# Patient Record
Sex: Female | Born: 1935 | Race: White | Hispanic: No | Smoking: Former smoker
Health system: Southern US, Community
[De-identification: ages and names within clinical notes are randomized; demographics above are authoritative.]

## PROBLEM LIST (undated history)

## (undated) DIAGNOSIS — K219 Gastro-esophageal reflux disease without esophagitis: Secondary | ICD-10-CM

## (undated) DIAGNOSIS — J189 Pneumonia, unspecified organism: Secondary | ICD-10-CM

## (undated) DIAGNOSIS — K5790 Diverticulosis of intestine, part unspecified, without perforation or abscess without bleeding: Secondary | ICD-10-CM

## (undated) DIAGNOSIS — M797 Fibromyalgia: Secondary | ICD-10-CM

## (undated) DIAGNOSIS — K5792 Diverticulitis of intestine, part unspecified, without perforation or abscess without bleeding: Secondary | ICD-10-CM

## (undated) DIAGNOSIS — R569 Unspecified convulsions: Secondary | ICD-10-CM

## (undated) DIAGNOSIS — F419 Anxiety disorder, unspecified: Secondary | ICD-10-CM

## (undated) DIAGNOSIS — S329XXA Fracture of unspecified parts of lumbosacral spine and pelvis, initial encounter for closed fracture: Secondary | ICD-10-CM

## (undated) DIAGNOSIS — K668 Other specified disorders of peritoneum: Secondary | ICD-10-CM

## (undated) DIAGNOSIS — I10 Essential (primary) hypertension: Secondary | ICD-10-CM

## (undated) DIAGNOSIS — E039 Hypothyroidism, unspecified: Secondary | ICD-10-CM

## (undated) DIAGNOSIS — M109 Gout, unspecified: Secondary | ICD-10-CM

## (undated) DIAGNOSIS — E079 Disorder of thyroid, unspecified: Secondary | ICD-10-CM

## (undated) DIAGNOSIS — M4322 Fusion of spine, cervical region: Secondary | ICD-10-CM

## (undated) DIAGNOSIS — R7303 Prediabetes: Secondary | ICD-10-CM

## (undated) DIAGNOSIS — G459 Transient cerebral ischemic attack, unspecified: Secondary | ICD-10-CM

## (undated) DIAGNOSIS — M1990 Unspecified osteoarthritis, unspecified site: Secondary | ICD-10-CM

## (undated) HISTORY — PX: ABDOMINAL HYSTERECTOMY: SHX81

## (undated) HISTORY — DX: Gout, unspecified: M10.9

## (undated) HISTORY — DX: Diverticulitis of intestine, part unspecified, without perforation or abscess without bleeding: K57.92

## (undated) HISTORY — PX: CERVICAL FUSION: SHX112

## (undated) HISTORY — PX: TOTAL VAGINAL HYSTERECTOMY: SHX2548

## (undated) HISTORY — DX: Fracture of unspecified parts of lumbosacral spine and pelvis, initial encounter for closed fracture: S32.9XXA

## (undated) HISTORY — PX: TONSILLECTOMY: SUR1361

## (undated) HISTORY — DX: Disorder of thyroid, unspecified: E07.9

## (undated) HISTORY — PX: SPINE SURGERY: SHX786

---
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
## 1998-07-25 ENCOUNTER — Ambulatory Visit (HOSPITAL_COMMUNITY): Admission: RE | Admit: 1998-07-25 | Discharge: 1998-07-25 | Payer: Self-pay | Admitting: Neurosurgery

## 1998-07-25 ENCOUNTER — Encounter: Payer: Self-pay | Admitting: Neurosurgery

## 1998-09-24 ENCOUNTER — Other Ambulatory Visit: Admission: RE | Admit: 1998-09-24 | Discharge: 1998-09-24 | Payer: Self-pay | Admitting: Obstetrics and Gynecology

## 1998-10-11 ENCOUNTER — Encounter: Payer: Self-pay | Admitting: Neurosurgery

## 1998-10-15 ENCOUNTER — Encounter: Payer: Self-pay | Admitting: Neurosurgery

## 1998-10-15 ENCOUNTER — Inpatient Hospital Stay (HOSPITAL_COMMUNITY): Admission: RE | Admit: 1998-10-15 | Discharge: 1998-10-17 | Payer: Self-pay | Admitting: Neurosurgery

## 1999-01-24 ENCOUNTER — Ambulatory Visit (HOSPITAL_COMMUNITY): Admission: RE | Admit: 1999-01-24 | Discharge: 1999-01-24 | Payer: Self-pay | Admitting: Neurosurgery

## 1999-01-24 ENCOUNTER — Encounter: Payer: Self-pay | Admitting: Neurosurgery

## 1999-02-14 ENCOUNTER — Inpatient Hospital Stay (HOSPITAL_COMMUNITY): Admission: RE | Admit: 1999-02-14 | Discharge: 1999-02-16 | Payer: Self-pay | Admitting: Neurosurgery

## 1999-02-14 ENCOUNTER — Encounter: Payer: Self-pay | Admitting: Neurosurgery

## 1999-04-30 ENCOUNTER — Ambulatory Visit (HOSPITAL_COMMUNITY): Admission: RE | Admit: 1999-04-30 | Discharge: 1999-04-30 | Payer: Self-pay | Admitting: Neurosurgery

## 1999-09-17 ENCOUNTER — Inpatient Hospital Stay (HOSPITAL_COMMUNITY): Admission: EM | Admit: 1999-09-17 | Discharge: 1999-09-17 | Payer: Self-pay | Admitting: Emergency Medicine

## 1999-09-17 ENCOUNTER — Encounter: Payer: Self-pay | Admitting: Gastroenterology

## 1999-09-30 ENCOUNTER — Ambulatory Visit (HOSPITAL_COMMUNITY): Admission: RE | Admit: 1999-09-30 | Discharge: 1999-09-30 | Payer: Self-pay | Admitting: Gastroenterology

## 1999-10-27 ENCOUNTER — Other Ambulatory Visit: Admission: RE | Admit: 1999-10-27 | Discharge: 1999-10-27 | Payer: Self-pay | Admitting: Internal Medicine

## 2000-05-24 ENCOUNTER — Encounter: Admission: RE | Admit: 2000-05-24 | Discharge: 2000-08-22 | Payer: Self-pay | Admitting: Internal Medicine

## 2005-03-02 ENCOUNTER — Ambulatory Visit (HOSPITAL_COMMUNITY): Admission: RE | Admit: 2005-03-02 | Discharge: 2005-03-02 | Payer: Self-pay | Admitting: Neurosurgery

## 2005-03-03 ENCOUNTER — Ambulatory Visit: Payer: Self-pay | Admitting: Oncology

## 2005-03-03 ENCOUNTER — Ambulatory Visit (HOSPITAL_COMMUNITY): Admission: RE | Admit: 2005-03-03 | Discharge: 2005-03-03 | Payer: Self-pay | Admitting: Neurosurgery

## 2005-03-05 ENCOUNTER — Ambulatory Visit (HOSPITAL_COMMUNITY): Admission: RE | Admit: 2005-03-05 | Discharge: 2005-03-05 | Payer: Self-pay | Admitting: Oncology

## 2005-05-26 ENCOUNTER — Inpatient Hospital Stay (HOSPITAL_COMMUNITY): Admission: RE | Admit: 2005-05-26 | Discharge: 2005-06-05 | Payer: Self-pay | Admitting: Neurosurgery

## 2010-12-20 ENCOUNTER — Encounter: Payer: Self-pay | Admitting: Neurosurgery

## 2012-03-14 ENCOUNTER — Other Ambulatory Visit: Payer: Self-pay | Admitting: Neurosurgery

## 2012-03-14 DIAGNOSIS — M47812 Spondylosis without myelopathy or radiculopathy, cervical region: Secondary | ICD-10-CM

## 2012-03-22 ENCOUNTER — Ambulatory Visit
Admission: RE | Admit: 2012-03-22 | Discharge: 2012-03-22 | Disposition: A | Discharge status: Home / Self Care | Payer: Medicare Other | Source: Ambulatory Visit | Attending: Neurosurgery | Admitting: Neurosurgery

## 2012-03-22 DIAGNOSIS — M47812 Spondylosis without myelopathy or radiculopathy, cervical region: Secondary | ICD-10-CM

## 2012-03-25 ENCOUNTER — Encounter (HOSPITAL_COMMUNITY): Payer: Self-pay | Admitting: Physical Medicine and Rehabilitation

## 2012-03-25 ENCOUNTER — Emergency Department (HOSPITAL_COMMUNITY): Payer: Medicare Other

## 2012-03-25 ENCOUNTER — Emergency Department (HOSPITAL_COMMUNITY)
Admission: EM | Admit: 2012-03-25 | Discharge: 2012-03-25 | Disposition: A | Discharge status: Home / Self Care | Payer: Medicare Other | Attending: Emergency Medicine | Admitting: Emergency Medicine

## 2012-03-25 DIAGNOSIS — Z79899 Other long term (current) drug therapy: Secondary | ICD-10-CM | POA: Insufficient documentation

## 2012-03-25 DIAGNOSIS — S0990XA Unspecified injury of head, initial encounter: Secondary | ICD-10-CM | POA: Insufficient documentation

## 2012-03-25 DIAGNOSIS — Y92009 Unspecified place in unspecified non-institutional (private) residence as the place of occurrence of the external cause: Secondary | ICD-10-CM | POA: Insufficient documentation

## 2012-03-25 DIAGNOSIS — M25559 Pain in unspecified hip: Secondary | ICD-10-CM | POA: Insufficient documentation

## 2012-03-25 DIAGNOSIS — W19XXXA Unspecified fall, initial encounter: Secondary | ICD-10-CM

## 2012-03-25 DIAGNOSIS — R51 Headache: Secondary | ICD-10-CM | POA: Insufficient documentation

## 2012-03-25 DIAGNOSIS — W1789XA Other fall from one level to another, initial encounter: Secondary | ICD-10-CM | POA: Insufficient documentation

## 2012-03-25 DIAGNOSIS — M542 Cervicalgia: Secondary | ICD-10-CM | POA: Insufficient documentation

## 2012-03-25 DIAGNOSIS — S7001XA Contusion of right hip, initial encounter: Secondary | ICD-10-CM

## 2012-03-25 DIAGNOSIS — S7000XA Contusion of unspecified hip, initial encounter: Secondary | ICD-10-CM | POA: Insufficient documentation

## 2012-03-25 HISTORY — DX: Fibromyalgia: M79.7

## 2012-03-25 HISTORY — DX: Fusion of spine, cervical region: M43.22

## 2012-03-25 HISTORY — DX: Diverticulosis of intestine, part unspecified, without perforation or abscess without bleeding: K57.90

## 2012-03-25 MED ORDER — OXYCODONE-ACETAMINOPHEN 5-325 MG PO TABS: 1.0000 | ORAL_TABLET | ORAL | Status: DC | PRN

## 2012-03-25 MED ORDER — IBUPROFEN 200 MG PO TABS
600.0000 mg | ORAL_TABLET | Freq: Once | ORAL | Status: AC
  Administered 2012-03-25: 600 mg via ORAL
  Filled 2012-03-25: qty 3

## 2012-03-25 MED ORDER — OXYCODONE-ACETAMINOPHEN 5-325 MG PO TABS
1.0000 | ORAL_TABLET | Freq: Once | ORAL | Status: AC
  Administered 2012-03-25: 1 via ORAL
  Filled 2012-03-25: qty 1

## 2012-03-25 MED ORDER — OXYCODONE-ACETAMINOPHEN 5-325 MG PO TABS
1.0000 | ORAL_TABLET | Freq: Once | ORAL | Status: DC
  Filled 2012-03-25: qty 1

## 2012-03-25 NOTE — ED Provider Notes (Addendum)
History     CSN: [id number]  Arrival date & time 03/25/12  1741   First MD Initiated Contact with Patient 03/25/12 1801      Chief Complaint  Patient presents with  . Fall   HPI The patient was standing on her front porch with her husband when she fell backwards and landed on her butt and her back on the ground.  This was approximately 2 feet off the ground.  As reported that she hit the back of her head and had been complaining of neck pain.  She's had a history of cervical fusion.  Her pain is moderate at this time.  She denies weakness of her upper lower extremities.  She has no lacerations.  She denies hip pain at this time.  She does report mild headache.  She has no nausea or vomiting.  She does not take anticoagulants.  Nothing worsens her symptoms.  Nothing improves her symptoms.  Her symptoms are constant.  Her pain is mild/moderate   Past Medical History  Diagnosis Date  . Diverticulosis   . Cervical vertebral fusion   . Fibromyalgia     No past surgical history on file.  Family history: Noncontributory  History  Substance Use Topics  . Smoking status: Never Smoker   . Smokeless tobacco: Not on file  . Alcohol Use: No    OB History    Grav Para Term Preterm Abortions TAB SAB Ect Mult Living                  Review of Systems  Allergies  Codeine  Home Medications   Current Outpatient Rx  Name Route Sig Dispense Refill  . VITAMIN D PO Oral Take 1 capsule by mouth daily.    Marland Kitchen ESTROGENS, CONJUGATED 0.625 MG/GM VA CREA Vaginal Place 0.5 g vaginally every morning.    Marland Kitchen FOLIC ACID 1 MG PO TABS Oral Take 1 mg by mouth daily.    Marland Kitchen GARLIC PO Oral Take 1 tablet by mouth daily.    Marland Kitchen LEVOTHYROXINE SODIUM 75 MCG PO TABS Oral Take 75 mcg by mouth every morning.    Marland Kitchen LORATADINE 10 MG PO TABS Oral Take 10 mg by mouth daily as needed. For allergies    . LORAZEPAM 0.5 MG PO TABS Oral Take 0.25 mg by mouth 2 (two) times daily as needed. For anxiety/insomnia    .  OMEPRAZOLE 20 MG PO CPDR Oral Take 20 mg by mouth 2 (two) times daily.    Marland Kitchen ZOLPIDEM TARTRATE 5 MG PO TABS Oral Take 2.5 mg by mouth at bedtime. Pt may take an additional 1/2 tab at night if needed for sleep    . OXYCODONE-ACETAMINOPHEN 5-325 MG PO TABS Oral Take 1 tablet by mouth every 4 (four) hours as needed for pain. 15 tablet 0    BP 113/67  Pulse 80  Temp(Src) 96.7 F (35.9 C) (Oral)  Resp 16  SpO2 96%  Physical Exam  Nursing note and vitals reviewed. Constitutional: She is oriented to person, place, and time. She appears well-developed and well-nourished. No distress.  HENT:  Head: Normocephalic and atraumatic.  Eyes: EOM are normal. Pupils are equal, round, and reactive to light.  Neck: Neck supple.       Immobilized in cervical collar  Cardiovascular: Normal rate, regular rhythm and normal heart sounds.   Pulmonary/Chest: Effort normal and breath sounds normal.  Abdominal: Soft. She exhibits no distension. There is no tenderness. There is no rebound and  no guarding.  Musculoskeletal: Normal range of motion.       Mild discomfort with range of motion of her bilateral hips.  No obvious deformity.  She has no thoracic or lumbar spinal tenderness or step-offs.  Neurological: She is alert and oriented to person, place, and time.  Skin: Skin is warm and dry.  Psychiatric: She has a normal mood and affect. Judgment normal.    ED Course  Procedures (including critical care time)  Labs Reviewed - No data to display Dg Chest 2 View  03/25/2012  *RADIOLOGY REPORT*  Clinical Data: Status post fall.  CHEST - 2 VIEW  Comparison: 03/21/2012  Findings: Heart size appears normal.  No pleural effusion or edema.  There are no airspace consolidation identified.  There is a calcified granuloma identified in the right upper lobe.  Calcified right hilar and right paratracheal lymph nodes are also noted.  IMPRESSION:  1.  No acute cardiopulmonary abnormalities.  Original Report Authenticated By:  Rosealee Albee, M.D.   Dg Pelvis 1-2 Views  03/25/2012  *RADIOLOGY REPORT*  Clinical Data: Fall   PELVIS - 1-2 VIEW  Comparison: 03/02/2005  Findings: Status post L2-S1 fusion with hardware and interbody fusion with cages at each disc space.  There is no evidence of fracture or dislocation.  There is no evidence of arthropathy or other focal bone abnormality.  Soft tissues are unremarkable.  IMPRESSION: No acute findings.  Original Report Authenticated By: Rosealee Albee, M.D.   Ct Head Wo Contrast  03/25/2012  *RADIOLOGY REPORT*  Clinical Data:  Fall  CT HEAD WITHOUT CONTRAST CT CERVICAL SPINE WITHOUT CONTRAST  Technique:  Multidetector CT imaging of the head and cervical spine was performed following the standard protocol without intravenous contrast.  Multiplanar CT image reconstructions of the cervical spine were also generated.  Comparison:  MR cervical spine 03/22/2012  CT HEAD  Findings: Global atrophy.  Chronic ischemic changes.  No mass effect, midline shift, or acute intracranial hemorrhage.  Mastoid air cells are clear.  Visualized paranasal sinuses are clear. Cranium intact.  IMPRESSION: No acute intracranial pathology.  CT CERVICAL SPINE  Findings: Anterior plate and screws and C4 and C5.  Solid interbody fusion from C4-C6.  Degenerative changes at the craniocervical junction are stable.  No breakage or loosening of the hardware.  No acute fracture.  Anterolisthesis C7 upon T1 is stable with facet arthropathy.  Degenerative disc disease throughout the cervical spine is stable.  IMPRESSION: No acute bony pathology.  Postoperative and degenerative changes are noted without significant change from the recent MRI.  Original Report Authenticated By: Donavan Burnet, M.D.   Ct Cervical Spine Wo Contrast  03/25/2012  *RADIOLOGY REPORT*  Clinical Data:  Fall  CT HEAD WITHOUT CONTRAST CT CERVICAL SPINE WITHOUT CONTRAST  Technique:  Multidetector CT imaging of the head and cervical spine was performed  following the standard protocol without intravenous contrast.  Multiplanar CT image reconstructions of the cervical spine were also generated.  Comparison:  MR cervical spine 03/22/2012  CT HEAD  Findings: Global atrophy.  Chronic ischemic changes.  No mass effect, midline shift, or acute intracranial hemorrhage.  Mastoid air cells are clear.  Visualized paranasal sinuses are clear. Cranium intact.  IMPRESSION: No acute intracranial pathology.  CT CERVICAL SPINE  Findings: Anterior plate and screws and C4 and C5.  Solid interbody fusion from C4-C6.  Degenerative changes at the craniocervical junction are stable.  No breakage or loosening of the hardware.  No acute  fracture.  Anterolisthesis C7 upon T1 is stable with facet arthropathy.  Degenerative disc disease throughout the cervical spine is stable.  IMPRESSION: No acute bony pathology.  Postoperative and degenerative changes are noted without significant change from the recent MRI.  Original Report Authenticated By: Donavan Burnet, M.D.   I personally reviewed the patient's images.  1. Fall   2. Minor head injury   3. Contusion of right hip       MDM  The patient fell off her front porch.  Her CT head and CT C-spine is normal.  Her chest and pelvis are normal.  This was a mechanical fall.  She's been able to angulate around emergent Hartmann without any difficulty.  Her pain is significantly improved after treatment in the ER.  She understands to return the emergency department for new worsening symptoms        Lyanne Co, MD 03/27/12 1610  Lyanne Co, MD 03/27/12 0020

## 2012-03-25 NOTE — ED Notes (Signed)
Per EMS: pt was on front porch, 2 ft off ground, dog jumped up and knocked pt over, hitting back of head; pt c/o neck pain with history of cervical fusion; PMS intact; vss, no deformity noted to back of head or neck; a&ox4; no LOC; HR 100 regular  BP 140/82 RR 20; allergic to codeine

## 2012-03-25 NOTE — ED Notes (Signed)
Pt presents to department for evaluation of fall. States she was standing on porch this evening when x2 large dogs jumped on her knocking her off porch approximately 2 feet. Pt states she landed on back and struck back of head. Denies LOC. Now states headache, coccyx and neck pain. LSB and c-collar per EMS. She is conscious alert and oriented x4 at the time. No obvious deformities noted. Able to move all extremities without difficulty. No lacerations/abrasions noted to scalp.

## 2012-03-25 NOTE — ED Notes (Signed)
Spoke with pharmacy and Dr. Patria Mane regarding percocet. Pt to be given PO and will continue to monitor closely.

## 2012-03-25 NOTE — ED Notes (Signed)
LSB removed by Dr. Patria Mane. C-collar in place. Pt resting quietly at the time. Family at bedside.

## 2012-03-28 ENCOUNTER — Encounter (HOSPITAL_COMMUNITY): Payer: Self-pay | Admitting: Emergency Medicine

## 2012-03-28 ENCOUNTER — Emergency Department (HOSPITAL_COMMUNITY): Payer: Medicare Other

## 2012-03-28 ENCOUNTER — Observation Stay (HOSPITAL_COMMUNITY)
Admission: EM | Admit: 2012-03-28 | Discharge: 2012-03-29 | Disposition: A | Discharge status: Home / Self Care | Payer: Medicare Other | Attending: General Surgery | Admitting: General Surgery

## 2012-03-28 ENCOUNTER — Inpatient Hospital Stay (HOSPITAL_COMMUNITY): Payer: Medicare Other

## 2012-03-28 DIAGNOSIS — S2239XA Fracture of one rib, unspecified side, initial encounter for closed fracture: Principal | ICD-10-CM | POA: Insufficient documentation

## 2012-03-28 DIAGNOSIS — M25511 Pain in right shoulder: Secondary | ICD-10-CM

## 2012-03-28 DIAGNOSIS — Y92009 Unspecified place in unspecified non-institutional (private) residence as the place of occurrence of the external cause: Secondary | ICD-10-CM | POA: Insufficient documentation

## 2012-03-28 DIAGNOSIS — S2249XA Multiple fractures of ribs, unspecified side, initial encounter for closed fracture: Secondary | ICD-10-CM

## 2012-03-28 DIAGNOSIS — K668 Other specified disorders of peritoneum: Secondary | ICD-10-CM | POA: Diagnosis present

## 2012-03-28 DIAGNOSIS — S2231XA Fracture of one rib, right side, initial encounter for closed fracture: Secondary | ICD-10-CM | POA: Diagnosis present

## 2012-03-28 DIAGNOSIS — M549 Dorsalgia, unspecified: Secondary | ICD-10-CM | POA: Insufficient documentation

## 2012-03-28 DIAGNOSIS — W1789XA Other fall from one level to another, initial encounter: Secondary | ICD-10-CM | POA: Insufficient documentation

## 2012-03-28 DIAGNOSIS — W19XXXA Unspecified fall, initial encounter: Secondary | ICD-10-CM | POA: Diagnosis present

## 2012-03-28 DIAGNOSIS — M25519 Pain in unspecified shoulder: Secondary | ICD-10-CM | POA: Insufficient documentation

## 2012-03-28 LAB — POCT I-STAT, CHEM 8
BUN: 13 mg/dL (ref 6–23)
Calcium, Ion: 1.19 mmol/L (ref 1.12–1.32)
Chloride: 99 mEq/L (ref 96–112)
Creatinine, Ser: 0.8 mg/dL (ref 0.50–1.10)
Glucose, Bld: 100 mg/dL — ABNORMAL HIGH (ref 70–99)
HCT: 38 % (ref 36.0–46.0)
Hemoglobin: 12.9 g/dL (ref 12.0–15.0)
Potassium: 4 mEq/L (ref 3.5–5.1)
Sodium: 137 mEq/L (ref 135–145)
TCO2: 28 mmol/L (ref 0–100)

## 2012-03-28 LAB — CBC
HCT: 38 % (ref 36.0–46.0)
Hemoglobin: 13.1 g/dL (ref 12.0–15.0)
MCH: 32 pg (ref 26.0–34.0)
MCHC: 34.5 g/dL (ref 30.0–36.0)
MCV: 92.7 fL (ref 78.0–100.0)
Platelets: 339 10*3/uL (ref 150–400)
RBC: 4.1 MIL/uL (ref 3.87–5.11)
RDW: 12.8 % (ref 11.5–15.5)
WBC: 7.8 10*3/uL (ref 4.0–10.5)

## 2012-03-28 MED ORDER — IOHEXOL 300 MG/ML  SOLN: 20.0000 mL | INTRAMUSCULAR | Status: AC

## 2012-03-28 MED ORDER — KETOROLAC TROMETHAMINE 30 MG/ML IJ SOLN
30.0000 mg | Freq: Once | INTRAMUSCULAR | Status: AC
  Administered 2012-03-28: 30 mg via INTRAVENOUS
  Filled 2012-03-28: qty 1

## 2012-03-28 MED ORDER — METRONIDAZOLE IN NACL 5-0.79 MG/ML-% IV SOLN
500.0000 mg | Freq: Once | INTRAVENOUS | Status: AC
  Administered 2012-03-28: 500 mg via INTRAVENOUS
  Filled 2012-03-28: qty 100

## 2012-03-28 MED ORDER — PIPERACILLIN-TAZOBACTAM 3.375 G IVPB
3.3750 g | Freq: Once | INTRAVENOUS | Status: AC
  Administered 2012-03-28: 3.375 g via INTRAVENOUS
  Filled 2012-03-28: qty 50

## 2012-03-28 NOTE — ED Notes (Signed)
Pt transported to xray 

## 2012-03-28 NOTE — H&P (Signed)
Sara Davila is an 78 y.o. female.   Chief Complaint: Free air  HPI:  Pt is a 78 year old female who presented to the ER with right shoulder pain.  She fell off a porch on Friday when she got pushed off by an enthusiastic yellow lab.  She struck her right side.  She was seen here in the ED, and was sent home without any fractures, and a script for oxycodone that she did not fill.  Today, her right shoulder progressively hurt more and more, and she was worried that it was broken.  She denies nausea, vomiting, fevers, chills, sweats, abdominal pain.  She does have some mild back pain, but this is not her primary complaint.  She has increased pain in the right shoulder when she moves her right arm.    Past Medical History  Diagnosis Date  . Diverticulosis   . Cervical vertebral fusion   . Fibromyalgia     History reviewed. No pertinent past surgical history.  No family history on file. Social History:  reports that she has never smoked. She does not have any smokeless tobacco history on file. She reports that she does not drink alcohol or use illicit drugs.  Allergies:  Allergies  Allergen Reactions  . Codeine Rash     (Not in a hospital admission)  Results for orders placed during the hospital encounter of 03/28/12 (from the past 48 hour(s))  CBC     Status: Normal   Collection Time   03/28/12  6:38 PM      Component Value Range Comment   WBC 7.8  4.0 - 10.5 (K/uL)    RBC 4.10  3.87 - 5.11 (MIL/uL)    Hemoglobin 13.1  12.0 - 15.0 (g/dL)    HCT 16.1  09.6 - 04.5 (%)    MCV 92.7  78.0 - 100.0 (fL)    MCH 32.0  26.0 - 34.0 (pg)    MCHC 34.5  30.0 - 36.0 (g/dL)    RDW 40.9  81.1 - 91.4 (%)    Platelets 339  150 - 400 (K/uL)   POCT I-STAT, CHEM 8     Status: Abnormal   Collection Time   03/28/12  6:59 PM      Component Value Range Comment   Sodium 137  135 - 145 (mEq/L)    Potassium 4.0  3.5 - 5.1 (mEq/L)    Chloride 99  96 - 112 (mEq/L)    BUN 13  6 - 23 (mg/dL)    Creatinine, Ser 7.82  0.50 - 1.10 (mg/dL)    Glucose, Bld 956 (*) 70 - 99 (mg/dL)    Calcium, Ion 2.13  1.12 - 1.32 (mmol/L)    TCO2 28  0 - 100 (mmol/L)    Hemoglobin 12.9  12.0 - 15.0 (g/dL)    HCT 08.6  57.8 - 46.9 (%)    Ct Abdomen Pelvis Wo Contrast  03/28/2012  *RADIOLOGY REPORT*  Clinical Data: Larey Seat off porch last week continued pain, question free air  CT ABDOMEN AND PELVIS WITHOUT CONTRAST  Technique:  Multidetector CT imaging of the abdomen and pelvis was performed following the standard protocol without intravenous contrast. Sagittal and coronal MPR images reconstructed from axial data set.  Comparison: Noncontrast CT abdomen and pelvis from PET CT of 03/05/2005  Findings: Emphysematous changes at lung bases with scarring at base of right middle lobe. Multiple low attenuation lesions within the liver, most representing cysts, largest 14 mm diameter. A  single low attenuation hepatic lesion measuring 9 x 8 mm lesion image 17 contains central high attenuation, nonspecific. Within limits of a nonenhanced exam, remainder of liver, spleen, pancreas, kidneys and adrenal glands unremarkable. Tiny foci of free intraperitoneal air are identified adjacent to liver and stomach. These are of uncertain etiology.  Low descent of the urinary bladder in the pelvis consistent with cystocele. Inferior bladder wall thickening is questioned. Uterus is surgically absent with nonvisualization of the ovaries and appendix. Scattered stool throughout colon. Air filled large and small bowel loops. Decompressed stomach. No additional mass, adenopathy or hernia. Prior lumbar fusion L2-S1. Scattered degenerative disc disease changes thoracolumbar spine.  IMPRESSION: Foci of free intraperitoneal air identified in upper abdomen compatible with perforated viscus though source is uncertain. Low descent of urinary bladder in the pelvis compatible with cystocele; unable to exclude bladder wall thickening/tumor at inferior bladder.  Gaseous distention of large and small bowel loops throughout abdomen. Prior multilevel lumbar fusion. Hepatic cysts with an additional nonspecific and 8 x 9 mm lesion containing central high attenuation at right lobe of liver.  Critical Value/emergent results were called by telephone at the time of interpretation on 03/28/2012  at 1819 hours  to  Dr. Ranae Palms, who verbally acknowledged these results.  Original Report Authenticated By: Lollie Marrow, M.D.   Dg Ribs Unilateral W/chest Right  03/28/2012  *RADIOLOGY REPORT*  Clinical Data: History of trauma from a fall complaining of right sided chest pain.  RIGHT RIBS AND CHEST - 3+ VIEW  Comparison: Chest x-ray 03/25/2012.  Findings: PA view of the chest and two views of the right ribs demonstrate a subtle minimally displaced fracture of the lateral aspect of the right tenth rib. Adjacent to this fracture projecting over the soft tissues of the right upper quadrant of the abdomen (i.e., over the lateral aspect of the liver) there are several serpiginous lucencies concerning for potential gas.  In addition, there appeared to be subtle slivers of gas beneath the right hemidiaphragm.  There is no consolidative airspace disease and no definite pleural effusions.  No definite pneumothorax.  Heart size is normal. Mediastinal contours are slightly distorted by patient rotation to the right, but generally unremarkable.  Multiple small calcified pulmonary nodules are noted, consistent with granulomas. Orthopedic fixation hardware is noted in the lumbar spine and cervical spine.  IMPRESSION: 1.  Subtle minimally displaced fracture of the lateral aspect of the right tenth rib.  While this is not associated with a pneumothorax, there is evidence to suggest gas within the soft tissues in the right upper quadrant of the abdomen, and potentially even a small volume of pneumoperitoneum beneath the right hemidiaphragm.  Further evaluation at this time with a left lateral decubitus  view of the abdomen is highly recommended. Alternatively, if there is strong clinical concern for intra abdominal injury, further evaluation with CT of the abdomen could be obtained.  These results were called by telephone on 03/28/2012  at  03:20 p.m. to  Ardath Sax, who verbally acknowledged these results.  Original Report Authenticated By: Florencia Reasons, M.D.   Dg Shoulder Right  03/28/2012  *RADIOLOGY REPORT*  Clinical Data: Fall  RIGHT SHOULDER - 2+ VIEW  Comparison: None.  Findings: No acute fracture and no dislocation.  IMPRESSION: No acute bony pathology.  Original Report Authenticated By: Donavan Burnet, M.D.   Dg Abd 2 Views  03/28/2012  **ADDENDUM** CREATED: 03/28/2012 16:21:28  On the decubitus view there is a small air lucency over  the liver. Small amount of free air cannot be excluded.  Further evaluation with CT scan of the abdomen is recommended.  I discussed the findings and recommendations with PA taking care of the patient in the emergency room.  **END ADDENDUM** SIGNED BY: Natasha Mead, M.D.    03/28/2012  *RADIOLOGY REPORT*  Clinical Data:  left side decubitus view to rule out free air  ABDOMEN - 2 VIEW  Comparison: 03/25/2012  Findings: Metallic fixation rods and screws within the lumbar spine are stable.  Mild distended small bowel loops mid abdomen with some air fluid levels suspicious for ileus or early bowel obstruction. No definite free abdominal air is noted in the decubitus view.  IMPRESSION:  Mild distended small bowel loops mid abdomen with some air fluid levels suspicious for ileus or early bowel obstruction.  No definite free abdominal air is noted in the decubitus view. Stable postsurgical changes lumbar spine. Original Report Authenticated By: Natasha Mead, M.D.    Review of Systems  Constitutional: Negative.   HENT: Negative.   Eyes: Negative.   Respiratory: Negative.   Cardiovascular: Negative.   Gastrointestinal: Negative.   Genitourinary: Negative.    Musculoskeletal: Positive for joint pain (right shoulder pain) and falls (fall friday onto right side.).  Skin: Negative.   Neurological: Negative.   Endo/Heme/Allergies: Negative.   Psychiatric/Behavioral: Negative.     Blood pressure 131/77, pulse 79, temperature 97.8 F (36.6 C), temperature source Oral, resp. rate 16, SpO2 96.00%. Physical Exam  Constitutional: She is oriented to person, place, and time. She appears well-developed and well-nourished. No distress.  HENT:  Head: Normocephalic and atraumatic.  Right Ear: External ear normal.  Left Ear: External ear normal.  Mouth/Throat: No oropharyngeal exudate.  Eyes: Conjunctivae are normal. Pupils are equal, round, and reactive to light. No scleral icterus.  Neck: Normal range of motion. No tracheal deviation present. No thyromegaly present.  Cardiovascular: Normal rate, regular rhythm, normal heart sounds and intact distal pulses.  Exam reveals no gallop and no friction rub.   No murmur heard. Respiratory: Effort normal and breath sounds normal. No respiratory distress. She has no wheezes. She has no rales. She exhibits no tenderness.  GI: Soft. Bowel sounds are normal. She exhibits no distension and no mass. There is no tenderness. There is no rebound and no guarding.  Musculoskeletal: She exhibits tenderness (tenderness of right shoulder in the bicipital groove anteriorly). She exhibits no edema.       Right shoulder decreased range of motion  Lymphadenopathy:    She has no cervical adenopathy.  Neurological: She is alert and oriented to person, place, and time. Coordination normal.  Skin: Skin is warm and dry. No rash noted. She is not diaphoretic. No erythema. No pallor.  Psychiatric: She has a normal mood and affect. Her behavior is normal. Judgment and thought content normal.     Assessment/Plan 1.  Small amount of intraperitoneal air.     Pt without abdominal symptoms.  Having point tenderness in the shoulder, so do  not suspect that shoulder pain is representative of diaphragmatic irritation.  Would repeat CT abdomen with oral and IV contrast to evaluate for visceral injury.  Would keep NPO at least until AM and would repeat abdominal xrays in AM.  Will place on IV fluids and antibiotics.    2.  R shoulder pain.  Plain films negative for fracture.  Certainly could have sprain.  Consider ortho consult or MR tomorrow if no improvement.  3.  Single R 10th rib fracture- suspect this is likely cause of free air given lack of symptoms.  Would treat with pain control and incentive spirometry.    Annalisse Minkoff 03/28/2012, 8:37 PM

## 2012-03-28 NOTE — ED Notes (Signed)
Pt states she needs an xray of her right shoulder. States she cannot move it without a lot of pain. Pt states that she doesn't like oxycodone because it constipates her and would rather have ultram as her friend told her it might not be as bad.

## 2012-03-28 NOTE — ED Provider Notes (Signed)
Report received and pt care assumed from PA Schinlever at change of shift. Pt with fall on Friday from 4 ft, landing on back, seen in ED for same with negative initial imaging studies. Increased right shoulder and right side pain since that time. Pt had x-rays of right shoulder and right ribs ordered initially. Questionable free air under the diaphragm on chest view- decub view ordered per radiologist request.    4:35 PM Spoke with radiologist, who advises that close inspection of decub view again demonstrates possible free air- CT abd/pelvis without contrast ordered per his recommendation.       6:25 PM CT abd/pelvis results were called to attending MD- evidence of free air. Basic labs are ordered in preparation for [facility] consultation and hospital admission. Discussed prolonged ED course with pt and spouse.    7:00 PM Labs resulted and reviewed. No anemia or leukocytosis. EDP to call [facility].  Shaaron Adler, [place] 03/28/12 2328

## 2012-03-28 NOTE — ED Notes (Signed)
Was here on 4/26 after falling off porch had xrays but now her  Rt shoulder hurts and her rib cage hurts and she wants ultram instead of oxycodone

## 2012-03-28 NOTE — ED Notes (Signed)
Pt returned from xray

## 2012-03-28 NOTE — ED Notes (Signed)
Patient transported to CT 

## 2012-03-28 NOTE — ED Provider Notes (Signed)
History     CSN: [id number]  Arrival date & time 03/28/12  1108   First MD Initiated Contact with Patient 03/28/12 1345      Chief Complaint  Patient presents with  . Shoulder Pain    (Consider location/radiation/quality/duration/timing/severity/associated sxs/prior treatment) HPI History provided by pt and prior chart.  Per prior chart, pt seen in ED on 03/25/12 for fall from portch onto buttocks, a drop of approx 36ft.  Had a CT head/cerivcal spine and xray chest/pelvis that were neg for acute pathology.  Pt d/c'd home w/ oxycodone for pain.  Pt returns to the ED because her neck/back pain have improved but she developed pain in her right shoulder and right lateral side the day after injury.  Pain in right shoulder severe and aggravated by ROM.  No associated extremity weakness/paresthesias.  Rib pain is aggravated by movement and deep inspiration.  Denies abd pain.  Did not fill her oxycodone because concerned that it will constipate her.  She would like a prescription for ultram instead.  Pt is not anti-coagulated.   Past Medical History  Diagnosis Date  . Diverticulosis   . Cervical vertebral fusion   . Fibromyalgia     History reviewed. No pertinent past surgical history.  No family history on file.  History  Substance Use Topics  . Smoking status: Never Smoker   . Smokeless tobacco: Not on file  . Alcohol Use: No    OB History    Grav Para Term Preterm Abortions TAB SAB Ect Mult Living                  Review of Systems  All other systems reviewed and are negative.    Allergies  Codeine  Home Medications   Current Outpatient Rx  Name Route Sig Dispense Refill  . VITAMIN D PO Oral Take 1 capsule by mouth daily.    Marland Kitchen ESTROGENS, CONJUGATED 0.625 MG/GM VA CREA Vaginal Place 0.5 g vaginally every morning.    Marland Kitchen FOLIC ACID 1 MG PO TABS Oral Take 1 mg by mouth daily.    Marland Kitchen GARLIC PO Oral Take 1 tablet by mouth daily.    Marland Kitchen LEVOTHYROXINE SODIUM 75 MCG PO TABS Oral  Take 75 mcg by mouth every morning.    Marland Kitchen LORATADINE 10 MG PO TABS Oral Take 10 mg by mouth daily as needed. For allergies    . LORAZEPAM 0.5 MG PO TABS Oral Take 0.5 mg by mouth 2 (two) times daily as needed. For anxiety/insomnia    . OMEPRAZOLE 20 MG PO CPDR Oral Take 20 mg by mouth 2 (two) times daily.    Marland Kitchen ZOLPIDEM TARTRATE 5 MG PO TABS Oral Take 2.5 mg by mouth at bedtime. for sleep      BP 99/65  Pulse 87  Temp 98.9 F (37.2 C)  Resp 16  SpO2 95%  Physical Exam  Nursing note and vitals reviewed. Constitutional: She is oriented to person, place, and time. She appears well-developed and well-nourished. No distress.  HENT:  Head: Normocephalic and atraumatic.  Eyes:       Normal appearance  Neck: Normal range of motion.  Cardiovascular: Normal rate and regular rhythm.   Pulmonary/Chest: Effort normal and breath sounds normal. No respiratory distress.       Tenderness right lower anterior and lateral rib cage.  No overlying laceration, abrasion or ecchymosis.   Abdominal: Soft. Bowel sounds are normal. She exhibits no distension. There is no tenderness.  Musculoskeletal:  Right shoulder w/out deformity.  Mild tenderness at [facility] joint and proximal scapula.  Pain w/ passive flexion/abduction greater than 45deg.  Nml elbow and wrist.  2+ radial pulse and distal sensation intact.  Nml ROM in rest of extremities.   Neurological: She is alert and oriented to person, place, and time.  Skin: Skin is warm and dry. No rash noted.  Psychiatric: She has a normal mood and affect. Her behavior is normal.    ED Course  Procedures (including critical care time)  Labs Reviewed - No data to display Dg Ribs Unilateral W/chest Right  03/28/2012  *RADIOLOGY REPORT*  Clinical Data: History of trauma from a fall complaining of right sided chest pain.  RIGHT RIBS AND CHEST - 3+ VIEW  Comparison: Chest x-ray 03/25/2012.  Findings: PA view of the chest and two views of the right ribs demonstrate a  subtle minimally displaced fracture of the lateral aspect of the right tenth rib. Adjacent to this fracture projecting over the soft tissues of the right upper quadrant of the abdomen (i.e., over the lateral aspect of the liver) there are several serpiginous lucencies concerning for potential gas.  In addition, there appeared to be subtle slivers of gas beneath the right hemidiaphragm.  There is no consolidative airspace disease and no definite pleural effusions.  No definite pneumothorax.  Heart size is normal. Mediastinal contours are slightly distorted by patient rotation to the right, but generally unremarkable.  Multiple small calcified pulmonary nodules are noted, consistent with granulomas. Orthopedic fixation hardware is noted in the lumbar spine and cervical spine.  IMPRESSION: 1.  Subtle minimally displaced fracture of the lateral aspect of the right tenth rib.  While this is not associated with a pneumothorax, there is evidence to suggest gas within the soft tissues in the right upper quadrant of the abdomen, and potentially even a small volume of pneumoperitoneum beneath the right hemidiaphragm.  Further evaluation at this time with a left lateral decubitus view of the abdomen is highly recommended. Alternatively, if there is strong clinical concern for intra abdominal injury, further evaluation with CT of the abdomen could be obtained.  These results were called by telephone on 03/28/2012  at  03:20 p.m. to  Ardath Sax, who verbally acknowledged these results.  Original Report Authenticated By: Florencia Reasons, M.D.     No diagnosis found.    MDM  78yo F had a fall 4 days ago and presents w/ new onset right shoulder and rib pain; onset one day after injury.  Right anterior and lateral lower rib cage ttp but no respiratory distress and breath sounds nml.  Rib series shows minimally displaced fx of lateral tenth rib + gas w/in soft tissues in RUQ of abd and possibly small  pneumoperitoneum beneath right hemidiaphragm.  Radiology recommended left lateral decubitus view of abd.  Pt may ultimately need a CT.  These results have been discussed w/ pt and her husband.  Pt denied having abd pain and abd benign/non-tender on exam.  She also c/o right shoulder pain.  Exam sig for tenderness proximal scapula/AC joint w/ painful ROM and no NS deficits.  Xray pending.  Artis Flock, PA-C to dispo.  4:05 PM         Otilio Miu, [place] 03/29/12 [phone]

## 2012-03-29 ENCOUNTER — Inpatient Hospital Stay (HOSPITAL_COMMUNITY): Payer: Medicare Other

## 2012-03-29 DIAGNOSIS — K668 Other specified disorders of peritoneum: Secondary | ICD-10-CM | POA: Diagnosis present

## 2012-03-29 DIAGNOSIS — W19XXXA Unspecified fall, initial encounter: Secondary | ICD-10-CM | POA: Diagnosis present

## 2012-03-29 DIAGNOSIS — S2231XA Fracture of one rib, right side, initial encounter for closed fracture: Secondary | ICD-10-CM | POA: Diagnosis present

## 2012-03-29 HISTORY — DX: Other specified disorders of peritoneum: K66.8

## 2012-03-29 LAB — CBC
HCT: 33.1 % — ABNORMAL LOW (ref 36.0–46.0)
Hemoglobin: 11.5 g/dL — ABNORMAL LOW (ref 12.0–15.0)
MCH: 31.6 pg (ref 26.0–34.0)
MCHC: 34.7 g/dL (ref 30.0–36.0)
MCV: 90.9 fL (ref 78.0–100.0)
Platelets: 317 10*3/uL (ref 150–400)
RBC: 3.64 MIL/uL — ABNORMAL LOW (ref 3.87–5.11)
RDW: 12.6 % (ref 11.5–15.5)
WBC: 5.5 10*3/uL (ref 4.0–10.5)

## 2012-03-29 LAB — COMPREHENSIVE METABOLIC PANEL
ALT: 17 U/L (ref 0–35)
AST: 23 U/L (ref 0–37)
Albumin: 2.7 g/dL — ABNORMAL LOW (ref 3.5–5.2)
Alkaline Phosphatase: 48 U/L (ref 39–117)
BUN: 12 mg/dL (ref 6–23)
CO2: 25 mEq/L (ref 19–32)
Calcium: 8 mg/dL — ABNORMAL LOW (ref 8.4–10.5)
Chloride: 98 mEq/L (ref 96–112)
Creatinine, Ser: 0.71 mg/dL (ref 0.50–1.10)
GFR calc Af Amer: >90 mL/min (ref >90)
GFR calc non Af Amer: 80 mL/min — ABNORMAL LOW (ref >90)
Glucose, Bld: 101 mg/dL — ABNORMAL HIGH (ref 70–99)
Potassium: 4.1 mEq/L (ref 3.5–5.1)
Sodium: 131 mEq/L — ABNORMAL LOW (ref 135–145)
Total Bilirubin: 0.6 mg/dL (ref 0.3–1.2)
Total Protein: 5.2 g/dL — ABNORMAL LOW (ref 6.0–8.3)

## 2012-03-29 MED ORDER — DIPHENHYDRAMINE HCL 50 MG/ML IJ SOLN: 12.5000 mg | Freq: Four times a day (QID) | INTRAMUSCULAR | Status: DC | PRN

## 2012-03-29 MED ORDER — IOHEXOL 300 MG/ML  SOLN
80.0000 mL | Freq: Once | INTRAMUSCULAR | Status: AC | PRN
  Administered 2012-03-29: 80 mL via INTRAVENOUS

## 2012-03-29 MED ORDER — LORATADINE 10 MG PO TABS
10.0000 mg | ORAL_TABLET | Freq: Every day | ORAL | Status: DC | PRN
  Filled 2012-03-29: qty 1

## 2012-03-29 MED ORDER — ACETAMINOPHEN 325 MG PO TABS
650.0000 mg | ORAL_TABLET | Freq: Four times a day (QID) | ORAL | Status: DC | PRN
  Administered 2012-03-29: 650 mg via ORAL
  Filled 2012-03-29: qty 2

## 2012-03-29 MED ORDER — ZOLPIDEM TARTRATE 5 MG PO TABS
2.5000 mg | ORAL_TABLET | Freq: Every day | ORAL | Status: DC
  Administered 2012-03-29: 2.5 mg via ORAL
  Filled 2012-03-29: qty 1

## 2012-03-29 MED ORDER — METRONIDAZOLE IN NACL 5-0.79 MG/ML-% IV SOLN
500.0000 mg | Freq: Three times a day (TID) | INTRAVENOUS | Status: DC
  Administered 2012-03-29: 500 mg via INTRAVENOUS
  Filled 2012-03-29 (×3): qty 100

## 2012-03-29 MED ORDER — DIPHENHYDRAMINE HCL 12.5 MG/5ML PO ELIX
12.5000 mg | ORAL_SOLUTION | Freq: Four times a day (QID) | ORAL | Status: DC | PRN
  Filled 2012-03-29: qty 5

## 2012-03-29 MED ORDER — TRAMADOL HCL 50 MG PO TABS: 50.0000 mg | ORAL_TABLET | Freq: Four times a day (QID) | ORAL | Status: AC | PRN

## 2012-03-29 MED ORDER — LORAZEPAM 0.5 MG PO TABS: 0.5000 mg | ORAL_TABLET | Freq: Two times a day (BID) | ORAL | Status: DC | PRN

## 2012-03-29 MED ORDER — ACETAMINOPHEN 650 MG RE SUPP: 650.0000 mg | Freq: Four times a day (QID) | RECTAL | Status: DC | PRN

## 2012-03-29 MED ORDER — PANTOPRAZOLE SODIUM 40 MG PO TBEC
40.0000 mg | DELAYED_RELEASE_TABLET | Freq: Every day | ORAL | Status: DC
  Administered 2012-03-29: 40 mg via ORAL
  Filled 2012-03-29: qty 1

## 2012-03-29 MED ORDER — LEVOTHYROXINE SODIUM 75 MCG PO TABS
75.0000 ug | ORAL_TABLET | Freq: Every day | ORAL | Status: DC
  Administered 2012-03-29: 75 ug via ORAL
  Filled 2012-03-29 (×2): qty 1

## 2012-03-29 MED ORDER — CIPROFLOXACIN IN D5W 400 MG/200ML IV SOLN
400.0000 mg | Freq: Two times a day (BID) | INTRAVENOUS | Status: DC
  Administered 2012-03-29: 400 mg via INTRAVENOUS
  Filled 2012-03-29 (×3): qty 200

## 2012-03-29 MED ORDER — TRAMADOL HCL 50 MG PO TABS
50.0000 mg | ORAL_TABLET | Freq: Four times a day (QID) | ORAL | Status: DC | PRN
  Administered 2012-03-29: 100 mg via ORAL
  Filled 2012-03-29: qty 2

## 2012-03-29 MED ORDER — KCL IN DEXTROSE-NACL 10-5-0.45 MEQ/L-%-% IV SOLN
INTRAVENOUS | Status: DC
  Administered 2012-03-29: 02:00:00 via INTRAVENOUS
  Filled 2012-03-29 (×3): qty 1000

## 2012-03-29 NOTE — Progress Notes (Signed)
Abdominal exam completely benign. Tiny amount of free air most likely due to microperforation of one of the patient's many colonic or small bowel diverticuli.  Plan likely D/C to [facility] later this PM. I spoke at length with the patient and her husband. Patient examined and I agree with the assessment and plan  Violeta Gelinas, MD, MPH, FACS Pager: [phone]  03/29/2012 12:00 PM

## 2012-03-29 NOTE — Evaluation (Signed)
Occupational Therapy Evaluation Patient Details Name: Sara Davila MRN: [id number] DOB: 08-05-1930 Today's Date: 03/29/2012 Time: 4098-1191 OT Time Calculation (min): 32 min  OT Assessment / Plan / Recommendation Clinical Impression  This 78 yo female s/p fall onto her right shoulder presents to acute OT with RUE shoulder pain. Written instructions/exercises given with recommend follow up OPPT for continuation of work on RUE. No further acute OT needs, will sign off.    OT Assessment  Patient does not need any further OT services    Follow Up Recommendations   (Outpatient PT)    Equipment Recommendations  None recommended by OT    Frequency      Precautions / Restrictions Precautions Precautions: Fall Restrictions Weight Bearing Restrictions: No (to tolerance on LUE)       ADL  Eating/Feeding: Simulated;Independent Where Assessed - Eating/Feeding: Edge of bed Grooming: Simulated;Supervision/safety Where Assessed - Grooming: Standing at sink Upper Body Bathing: Simulated;Supervision/safety Where Assessed - Upper Body Bathing: Unsupported;Sit to stand from chair;Sit to stand from bed Lower Body Bathing: Simulated;Supervision/safety Where Assessed - Lower Body Bathing: Unsupported;Sit to stand from chair;Sit to stand from bed Upper Body Dressing: Simulated;Supervision/safety Where Assessed - Upper Body Dressing: Unsupported;Sit to stand from chair;Sit to stand from bed Lower Body Dressing: Simulated Where Assessed - Lower Body Dressing: Unsupported;Sit to stand from chair;Sit to stand from bed Toilet Transfer: Simulated Toilet Transfer Method: Stand pivot Toilet Transfer Equipment: Bedside commode Toileting - Clothing Manipulation: Simulated;Independent Where Assessed - Toileting Clothing Manipulation: Standing Toileting - Hygiene: Simulated;Independent Where Assessed - Toileting Hygiene: Sit on 3-in-1 or toilet Tub/Shower Transfer: Not assessed Tub/Shower Transfer  Method: Not assessed Equipment Used:  (None) Ambulation Related to ADLs: Per PT pt moving well ADL Comments: Encouraged pt to use RUE as much as she can tolerate; it can move her RUE in all directions some with more pain than others, but can move it. Cannot accept any resistance for shoulder movements without pain    OT Goals    Visit Information  Last OT Received On: 03/29/12 Assistance Needed: +1    Subjective Data  Subjective: The heat feels really good (to her shoulder) Patient Stated Goal: Get her right shoulder to be pain free   Prior Functioning  Home Living Lives With: Spouse Available Help at Discharge: Family;Available 24 hours/day Type of Home: Apartment Home Access: Level entry;Elevator Home Layout: One level Bathroom Shower/Tub: Tub/shower unit;C[employer] Firefighter: Handicapped height Bathroom Accessibility: Yes How Accessible: Accessible via walker Home Adaptive Equipment: Straight cane Prior Function Level of Independence: Independent Able to Take Stairs?: Reciprically Driving: Yes Vocation: Retired Musician: No difficulties Dominant Hand: Right (but writes left handed)    Cognition  Overall Cognitive Status: Appears within functional limits for tasks assessed/performed Arousal/Alertness: Awake/alert Orientation Level: Appears intact for tasks assessed Behavior During Session: [name] for tasks performed    Extremity/Trunk Assessment Right Upper Extremity Assessment RUE ROM/Strength/Tone: Deficits RUE ROM/Strength/Tone Deficits: Pain in sitting when gets to about 90 degrees shoulder flexion and abduction; as well as full external rotation. Left Upper Extremity Assessment LUE ROM/Strength/Tone: Within functional levels   Mobility Bed Mobility Bed Mobility: Supine to Sit;Sitting - Scoot to Edge of Bed Supine to Sit: 7: Independent Sitting - Scoot to Edge of Bed: 7: Independent Transfers Transfers: Sit to Stand;Stand to Sit Sit to  Stand: 7: Independent;With upper extremity assist;From bed Stand to Sit: 7: Independent;With upper extremity assist;To bed   Exercise    Balance    End of Session  OT - End of Session Equipment Utilized During Treatment:  (None) Activity Tolerance: Patient tolerated treatment well (mild limitations due to pain) Patient left: in bed;with call bell/phone within reach;with family/visitor present (husband)   Evette Georges [phone] 03/29/2012, 11:52 AM
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
## 2012-03-29 NOTE — ED Provider Notes (Signed)
Medical screening examination/treatment/procedure(s) were performed by non-physician practitioner and as supervising physician I was immediately available for consultation/collaboration.   Ouida Abeyta, MD 03/29/12 2338 

## 2012-03-29 NOTE — Progress Notes (Signed)
UR complete 

## 2012-03-29 NOTE — Discharge Instructions (Signed)
You have no restrictions. You may do anything your pain allows.

## 2012-03-29 NOTE — Progress Notes (Signed)
Patient ID: Sara Davila, female   DOB: 10-Nov-1930, 78 y.o.   MRN: [id number]   LOS: 1 day   Subjective: Denies abd pain, N/V. Would like ultram instead of oxycodone.  Objective: Vital signs in last 24 hours: Temp:  [97.1 F (36.2 C)-98.9 F (37.2 C)] 97.1 F (36.2 C) (04/30 0658) Pulse Rate:  [71-87] 74  (04/30 0658) Resp:  [16-18] 18  (04/30 0658) BP: (99-141)/(63-78) 132/72 mmHg (04/30 0658) SpO2:  [95 %-97 %] 96 % (04/30 0658) Weight:  [43.092 kg (95 lb)] 43.092 kg (95 lb) (04/29 2300) Last BM Date: 03/28/12  Lab Results:  CBC  Basename 03/29/12 0640 03/28/12 1859 03/28/12 1838  WBC 5.5 -- 7.8  HGB 11.5* 12.9 --  HCT 33.1* 38.0 --  PLT 317 -- 339   BMET  Basename 03/29/12 0640 03/28/12 1859  NA 131* 137  K 4.1 4.0  CL 98 99  CO2 25 --  GLUCOSE 101* 100*  BUN 12 13  CREATININE 0.71 0.80  CALCIUM 8.0* --    General appearance: alert and no distress Resp: clear to auscultation bilaterally Cardio: regular rate and rhythm GI: normal findings: bowel sounds normal and soft, non-tender  Assessment/Plan: Fall Right rib fx -- Pain control and pulmonary toilet Free air -- Not worrisome for acute abdomen. Will go ahead and feed, d/c antibiotics. Multiple medical problems FEN -- PT/OT consult VTE -- ambulate Dispo -- Likely home this afternoon if pain meds effective and PT/OT clear. Lives with husband at [facility].   Freeman Caldron, PA-C Pager: [phone] General Trauma PA Pager: [phone]   03/29/2012

## 2012-03-29 NOTE — ED Provider Notes (Signed)
Medical screening examination/treatment/procedure(s) were performed by non-physician practitioner and as supervising physician I was immediately available for consultation/collaboration.   Jatorian Renault, MD 03/29/12 2335 
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
## 2012-03-29 NOTE — Progress Notes (Signed)
DC home with husband. Verbally understood DC instructions.

## 2012-03-29 NOTE — Discharge Summary (Signed)
Clotilda Hafer, MD, MPH, FACS Pager: [phone]  

## 2012-06-01 ENCOUNTER — Other Ambulatory Visit (HOSPITAL_COMMUNITY): Payer: Self-pay | Admitting: Gastroenterology

## 2012-06-01 DIAGNOSIS — K5790 Diverticulosis of intestine, part unspecified, without perforation or abscess without bleeding: Secondary | ICD-10-CM

## 2012-06-01 DIAGNOSIS — R140 Abdominal distension (gaseous): Secondary | ICD-10-CM

## 2012-06-14 ENCOUNTER — Encounter (HOSPITAL_COMMUNITY)
Admission: RE | Admit: 2012-06-14 | Discharge: 2012-06-14 | Disposition: A | Discharge status: Home / Self Care | Payer: Medicare Other | Source: Ambulatory Visit | Attending: Gastroenterology | Admitting: Gastroenterology

## 2012-06-14 DIAGNOSIS — R1013 Epigastric pain: Secondary | ICD-10-CM | POA: Insufficient documentation

## 2012-06-14 DIAGNOSIS — R142 Eructation: Secondary | ICD-10-CM | POA: Insufficient documentation

## 2012-06-14 DIAGNOSIS — R141 Gas pain: Secondary | ICD-10-CM | POA: Insufficient documentation

## 2012-06-14 DIAGNOSIS — K5790 Diverticulosis of intestine, part unspecified, without perforation or abscess without bleeding: Secondary | ICD-10-CM

## 2012-06-14 DIAGNOSIS — K3189 Other diseases of stomach and duodenum: Secondary | ICD-10-CM | POA: Insufficient documentation

## 2012-06-14 DIAGNOSIS — R140 Abdominal distension (gaseous): Secondary | ICD-10-CM

## 2012-06-14 DIAGNOSIS — K5730 Diverticulosis of large intestine without perforation or abscess without bleeding: Secondary | ICD-10-CM | POA: Insufficient documentation

## 2012-06-14 DIAGNOSIS — R143 Flatulence: Secondary | ICD-10-CM | POA: Insufficient documentation

## 2012-06-14 MED ORDER — TECHNETIUM TC 99M SULFUR COLLOID
2.2000 | Freq: Once | INTRAVENOUS | Status: AC | PRN
  Administered 2012-06-14: 2.2 via INTRAVENOUS

## 2014-07-23 ENCOUNTER — Other Ambulatory Visit: Payer: Self-pay | Admitting: Gastroenterology

## 2014-07-23 DIAGNOSIS — R140 Abdominal distension (gaseous): Secondary | ICD-10-CM

## 2014-07-23 DIAGNOSIS — K6389 Other specified diseases of intestine: Secondary | ICD-10-CM

## 2014-07-26 ENCOUNTER — Ambulatory Visit
Admission: RE | Admit: 2014-07-26 | Discharge: 2014-07-26 | Disposition: A | Discharge status: Home / Self Care | Payer: Medicare Other | Source: Ambulatory Visit | Attending: Gastroenterology | Admitting: Gastroenterology

## 2014-07-26 DIAGNOSIS — K6389 Other specified diseases of intestine: Secondary | ICD-10-CM

## 2014-07-26 DIAGNOSIS — R140 Abdominal distension (gaseous): Secondary | ICD-10-CM

## 2015-06-14 ENCOUNTER — Other Ambulatory Visit: Payer: Self-pay | Admitting: Family Medicine

## 2015-06-14 DIAGNOSIS — R1310 Dysphagia, unspecified: Secondary | ICD-10-CM

## 2015-06-21 ENCOUNTER — Ambulatory Visit
Admission: RE | Admit: 2015-06-21 | Discharge: 2015-06-21 | Disposition: A | Discharge status: Home / Self Care | Payer: Commercial Managed Care - HMO | Source: Ambulatory Visit | Attending: Family Medicine | Admitting: Family Medicine

## 2015-06-21 DIAGNOSIS — R1310 Dysphagia, unspecified: Secondary | ICD-10-CM

## 2015-07-12 ENCOUNTER — Ambulatory Visit: Payer: Commercial Managed Care - HMO

## 2015-07-16 ENCOUNTER — Other Ambulatory Visit (HOSPITAL_COMMUNITY): Payer: Self-pay | Admitting: Family Medicine

## 2015-07-16 DIAGNOSIS — R1314 Dysphagia, pharyngoesophageal phase: Secondary | ICD-10-CM

## 2015-07-30 ENCOUNTER — Ambulatory Visit (HOSPITAL_COMMUNITY)
Admission: RE | Admit: 2015-07-30 | Discharge: 2015-07-30 | Disposition: A | Discharge status: Home / Self Care | Payer: Medicare Other | Source: Ambulatory Visit | Attending: Family Medicine | Admitting: Family Medicine

## 2015-07-30 DIAGNOSIS — R1314 Dysphagia, pharyngoesophageal phase: Secondary | ICD-10-CM | POA: Diagnosis not present

## 2015-07-30 NOTE — Procedures (Signed)
Objective Swallowing Evaluation: Other (Comment)  Patient Details  Name: STEPHANE JUNKINS MRN: [id number] Date of Birth: 1929-12-02  Today's Date: 07/30/2015 Time: SLP Start Time (ACUTE ONLY): 1310-SLP Stop Time (ACUTE ONLY): 1335 SLP Time Calculation (min) (ACUTE ONLY): 25 min  Past Medical History:  Past Medical History  Diagnosis Date  . Diverticulosis   . Cervical vertebral fusion   . Fibromyalgia    Past Surgical History: No past surgical history on file. HPI:  Other Pertinent Information: 81 yo female referred for MBS due to pt report of dysphagia.  Pt underwent recent barium swallow with findings of esophageal dysmotility, small hiatal hernia with GER, and concern for possible aspiration - radiologist recommended follow up SLP evaluation due to concern for aspiration on esophagram 06/21/2015.  Pt PMH + for anxiety, remote hx of TB, ? vasculitis- episode of confusion/seizure in FLA, fibromyalgia, OEA, GERD, cervical spine issues s/p ACDF in 96 and 2000 per pt.  Pt reports primary difficulty being sensation of "dust" hitting larynx and causing her to cough excessively - requiring water to clear.  She states she is apprehensive to drive due to this issue.  Pt denies unintentional weight loss due to dysphagia nor recurrent pneumonias.  She does report occasional issues with sensing food  *chicken* lodging in throat requiring liquids to clear.  Recent laryngitis reported by pt - x2 months ago with continued vocal hoarseness. Pt admits to using her PPI on and off - she stats she has been taking it for 2 weeks and had been off of it for two weeks prior to that time.  No Data Recorded  Assessment / Plan / Recommendation CHL IP CLINICAL IMPRESSIONS 07/30/2015  Therapy Diagnosis Mild pharyngeal phase dysphagia  Clinical Impression  Pt presents with minimal pharyngeal dysphagia with sensorimotor deficits resulting in decreased epiglottic deflection/tongue base retraction allowing minmal amount of  pharyngeal/vallecular residuals with solids/puree.  Consumption of liquids faciliates clearance.  Pt with trace laryngeal penetration of thin that cleared effectively with further swallows - given pt's age, this is [facility].  Barium tablet given with thin cleared through pharynx in timely fashion.  Pt may benefit from short-term SLP follow up to help mitigate her dysphagia by strengthening tongue base retraction.   Using live video, educated pt to findings and compensation strategies. Pt did not cough or report sensation of food lodging in throat during her evaluation.  Pt's major complaint of "choking" "coughing" - sensing dust in her throat was not reproduced during this evaluation. ? If pt has neural overstimulation due to recent laryngitis.  Xerostomia compensations provided as well in writing and verbally.          CHL IP TREATMENT RECOMMENDATION 07/30/2015  Treatment Recommendations F/u OP SLP     CHL IP DIET RECOMMENDATION 07/30/2015  SLP Diet Recommendations Age appropriate regular solids;Thin  Liquid Administration via Cup, straw  Medication Administration Whole meds with liquid- follow with banana or peanut butter to help clear per pt  Compensations Follow solids with liquid  Postural Changes and/or Swallow Maneuvers Reflux precautions     CHL IP OTHER RECOMMENDATIONS 07/30/2015  Recommended Consults Consider ENT evaluation  Oral Care Recommendations Oral care BID  Other Recommendations Clarify dietary restrictions         CHL IP REASON FOR REFERRAL 07/30/2015  Reason for Referral Objectively evaluate swallowing function     CHL IP ORAL PHASE 07/30/2015  Oral Phase WFL      CHL IP PHARYNGEAL PHASE 07/30/2015  Pharyngeal Phase  Impaired      CHL IP CERVICAL ESOPHAGEAL PHASE 07/30/2015  Cervical Esophageal Phase WFL  Pudding Teaspoon (None)  Pudding Cup (None)  Honey Teaspoon (None)  Honey Cup (None)  Honey Straw (None)  Nectar Teaspoon (None)  Nectar Cup (None)  Nectar Straw  (None)  Nectar Sippy Cup (None)  Thin Teaspoon (None)  Thin Cup (None)  Thin Straw (None)  Thin Sippy Cup (None)  Cervical Esophageal Comment (None)    CHL IP GO 07/30/2015  Functional Assessment Tool Used clinical judgement, MBS  Functional Limitations Swallowing  Swallow Current Status (D2202) CI  Swallow Goal Status (R4270) CI  Swallow Discharge Status [phone]) CI          Luanna Salk, [facility] SLP [phone]   07/30/2015, 2:39 PM

## 2015-08-07 ENCOUNTER — Ambulatory Visit: Payer: Medicare Other | Attending: Family Medicine

## 2015-08-07 DIAGNOSIS — R1313 Dysphagia, pharyngeal phase: Secondary | ICD-10-CM | POA: Insufficient documentation

## 2015-08-07 NOTE — Therapy (Signed)
[facility] [address] [place], [place], [address] Phone: [phone]   Fax:  [phone]  Patient Details  Name: Sara Davila MRN: [id number] Date of Birth: 1930/02/03 Referring Provider:  Aretta Nip, MD  Encounter Date: 08/07/2015  Met with pt briefly and her major concern is her voice, not necessarily swallowing, as that has improved much since she has become more careful with POs.   She mentioned referral to ENT and SLP concurred that may be an appropriate course of action at this time.  Pt to speak with her PCP to assess need for ENT referral.  This SLP told pt she could return here should voice therapy be recommended after any possible ENT evaluation.  [facility] , [place], [place]   08/07/2015, 2:50 PM  [facility] [address] [place], [place], [address] Phone: [phone]   Fax:  [phone]

## 2016-08-21 ENCOUNTER — Ambulatory Visit: Payer: Medicare Other | Admitting: Neurology

## 2016-08-28 ENCOUNTER — Ambulatory Visit: Payer: Medicare Other | Admitting: Neurology

## 2016-08-31 ENCOUNTER — Encounter: Payer: Self-pay | Admitting: Neurology

## 2016-08-31 ENCOUNTER — Other Ambulatory Visit: Payer: Self-pay | Admitting: Neurology

## 2016-08-31 ENCOUNTER — Telehealth: Payer: Self-pay | Admitting: Neurology

## 2016-08-31 ENCOUNTER — Ambulatory Visit (INDEPENDENT_AMBULATORY_CARE_PROVIDER_SITE_OTHER): Payer: Medicare Other | Admitting: Neurology

## 2016-08-31 ENCOUNTER — Ambulatory Visit
Admission: RE | Admit: 2016-08-31 | Discharge: 2016-08-31 | Disposition: A | Discharge status: Home / Self Care | Payer: Medicare Other | Source: Ambulatory Visit | Attending: Neurology | Admitting: Neurology

## 2016-08-31 VITALS — BP 140/86 | HR 87 | Ht 60.0 in | Wt 94.0 lb

## 2016-08-31 DIAGNOSIS — G609 Hereditary and idiopathic neuropathy, unspecified: Secondary | ICD-10-CM

## 2016-08-31 DIAGNOSIS — R202 Paresthesia of skin: Secondary | ICD-10-CM | POA: Diagnosis not present

## 2016-08-31 DIAGNOSIS — G5792 Unspecified mononeuropathy of left lower limb: Secondary | ICD-10-CM | POA: Diagnosis not present

## 2016-08-31 DIAGNOSIS — G629 Polyneuropathy, unspecified: Secondary | ICD-10-CM | POA: Diagnosis not present

## 2016-08-31 DIAGNOSIS — M79674 Pain in right toe(s): Secondary | ICD-10-CM

## 2016-08-31 DIAGNOSIS — M79675 Pain in left toe(s): Secondary | ICD-10-CM | POA: Diagnosis not present

## 2016-08-31 DIAGNOSIS — E538 Deficiency of other specified B group vitamins: Secondary | ICD-10-CM | POA: Diagnosis not present

## 2016-08-31 DIAGNOSIS — E519 Thiamine deficiency, unspecified: Secondary | ICD-10-CM | POA: Diagnosis not present

## 2016-08-31 MED ORDER — GABAPENTIN 300 MG PO CAPS
300.0000 mg | ORAL_CAPSULE | Freq: Three times a day (TID) | ORAL | 11 refills | Status: DC
Start: 2016-08-31 — End: 2017-03-08

## 2016-08-31 NOTE — Telephone Encounter (Signed)
Called Diane back. Advised I have signed orders from [place], [place]. She requested I fax to [phone]. Advised I will do that as soon as I am off the phone. She verbalized understanding and is going to look out for fax.  Faxed orders to above number. Received confirmation.

## 2016-08-31 NOTE — Patient Instructions (Signed)
Remember to drink plenty of fluid, eat healthy meals and do not skip any meals. Try to eat protein with a every meal and eat a healthy snack such as fruit or nuts in between meals. Try to keep a regular sleep-wake schedule and try to exercise daily, particularly in the form of walking, 20-30 minutes a day, if you can.   As far as your medications are concerned, I would like to suggest: Neurontin/Gabapentin 300mg  start with one at bedtime  As far as diagnostic testing: Labs  I would like to see you back as needed, sooner if we need to. Please call us with any interim questions, concerns, problems, updates or refill requests.   Our phone number is [phone]. We also have an after hours call service for urgent matters and there is a physician on-call for urgent questions. For any emergencies you know to call 911 or go to the nearest emergency room  Gabapentin capsules or tablets What is this medicine? GABAPENTIN (GA ba pen tin) is used to control partial seizures in adults with epilepsy. It is also used to treat certain types of nerve pain. This medicine may be used for other purposes; ask your health care provider or pharmacist if you have questions. What should I tell my health care provider before I take this medicine? They need to know if you have any of these conditions: -kidney disease -suicidal thoughts, plans, or attempt; a previous suicide attempt by you or a family member -an unusual or allergic reaction to gabapentin, other medicines, foods, dyes, or preservatives -pregnant or trying to get pregnant -breast-feeding How should I use this medicine? Take this medicine by mouth with a glass of water. Follow the directions on the prescription label. You can take it with or without food. If it upsets your stomach, take it with food.Take your medicine at regular intervals. Do not take it more often than directed. Do not stop taking except on your doctor's advice. If you are directed to  break the 600 or 800 mg tablets in half as part of your dose, the extra half tablet should be used for the next dose. If you have not used the extra half tablet within 28 days, it should be thrown away. A special MedGuide will be given to you by the pharmacist with each prescription and refill. Be sure to read this information carefully each time. Talk to your pediatrician regarding the use of this medicine in children. Special care may be needed. Overdosage: If you think you have taken too much of this medicine contact a poison control center or emergency room at once. NOTE: This medicine is only for you. Do not share this medicine with others. What if I miss a dose? If you miss a dose, take it as soon as you can. If it is almost time for your next dose, take only that dose. Do not take double or extra doses. What may interact with this medicine? Do not take this medicine with any of the following medications: -other gabapentin products This medicine may also interact with the following medications: -alcohol -antacids -antihistamines for allergy, cough and cold -certain medicines for anxiety or sleep -certain medicines for depression or psychotic disturbances -homatropine; hydrocodone -naproxen -narcotic medicines (opiates) for pain -phenothiazines like chlorpromazine, mesoridazine, prochlorperazine, thioridazine This list may not describe all possible interactions. Give your health care provider a list of all the medicines, herbs, non-prescription drugs, or dietary supplements you use. Also tell them if you smoke, drink alcohol, or  use illegal drugs. Some items may interact with your medicine. What should I watch for while using this medicine? Visit your doctor or health care professional for regular checks on your progress. You may want to keep a record at home of how you feel your condition is responding to treatment. You may want to share this information with your doctor or health care  professional at each visit. You should contact your doctor or health care professional if your seizures get worse or if you have any new types of seizures. Do not stop taking this medicine or any of your seizure medicines unless instructed by your doctor or health care professional. Stopping your medicine suddenly can increase your seizures or their severity. Wear a medical identification bracelet or chain if you are taking this medicine for seizures, and carry a card that lists all your medications. You may get drowsy, dizzy, or have blurred vision. Do not drive, use machinery, or do anything that needs mental alertness until you know how this medicine affects you. To reduce dizzy or fainting spells, do not sit or stand up quickly, especially if you are an older patient. Alcohol can increase drowsiness and dizziness. Avoid alcoholic drinks. Your mouth may get dry. Chewing sugarless gum or sucking hard candy, and drinking plenty of water will help. The use of this medicine may increase the chance of suicidal thoughts or actions. Pay special attention to how you are responding while on this medicine. Any worsening of mood, or thoughts of suicide or dying should be reported to your health care professional right away. Women who become pregnant while using this medicine may enroll in the [place] Pregnancy Registry by calling [phone]. This registry collects information about the safety of antiepileptic drug use during pregnancy. What side effects may I notice from receiving this medicine? Side effects that you should report to your doctor or health care professional as soon as possible: -allergic reactions like skin rash, itching or hives, swelling of the face, lips, or tongue -worsening of mood, thoughts or actions of suicide or dying Side effects that usually do not require medical attention (report to your doctor or health care professional if they continue or are  bothersome): -constipation -difficulty walking or controlling muscle movements -dizziness -nausea -slurred speech -tiredness -tremors -weight gain This list may not describe all possible side effects. Call your doctor for medical advice about side effects. You may report side effects to FDA at 1-800-FDA-1088. Where should I keep my medicine? Keep out of reach of children. This medicine may cause accidental overdose and death if it taken by other adults, children, or pets. Mix any unused medicine with a substance like cat litter or coffee grounds. Then throw the medicine away in a sealed container like a sealed bag or a coffee can with a lid. Do not use the medicine after the expiration date. Store at room temperature between 15 and 30 degrees C (59 and 86 degrees F). NOTE: This sheet is a summary. It may not cover all possible information. If you have questions about this medicine, talk to your doctor, pharmacist, or health care provider.    2016, Elsevier/Gold Standard. (2014-01-12 15:26:50)

## 2016-08-31 NOTE — Telephone Encounter (Signed)
Sara Davila/[facility] called to advise, patient there now for lab panel, they do not have the order.

## 2016-08-31 NOTE — Progress Notes (Signed)
[facility]    Provider:  Dr Jaynee Eagles Referring Provider: Aretta Nip, MD  Primary Care Physician:  Aretta Nip, MD  CC:  paresthesias  HPI:  Sara Davila is a 82 y.o. female here as a referral from [place], MD for paresthesias. 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 Unclear, tapered off prednisone without any recurrence, fibromyalgia, osteoarthritis, vasculitis, gluten intolerance, tinnitus, hypothyroidism, sleep disorder unspecified,  paresthesias of both feet. She is on B12 supplements 1500  daily.She has numbness in digit #2 on both feet. Started out mild and then it got worse. Wearing a bandaid helps. Burning in one toe. The rest of the foot is unaffected. It involves the whole top (not bottom) of the toe to the base of the toe doesn't extend past the base into the foot. Even touching the sheet on the toe hurts. She has to keep something in the bed to stop it from touching the toe. She wakes up and her toe hurts. Started on the top of the toe in the middle just in the top after wearing tight shoes, no other toe affected. She goes barefoot a lot because it hurts. It is affecting her sleep. Symptoms are symmetric bilaterally. No cramping in the feet. No LBP or radicular symptoms. Started 2 years ago sporadically and slowly increased and now worse at night. Better with movement. She used to wear "wedge" shoes that pressed more on that toe. No other focal neurologic deficits or concerns. No other modifying factors or associated symptoms.  Reviewed notes, labs and imaging from outside physicians, which showed:  BUN 17, creatinine 0.660 Jayne 13th 2017, TSH 0.370 05/12/2016.  XR foot: personally reviewed imaging and agree with the following:  IMPRESSION: No acute fracture or dislocation of the second toe is observed. No  significant degenerative change is observed.  Review of Systems: Patient complains of symptoms per HPI as well as the following symptoms: Ringing in ears, spinning sensation, constipation, joint pain, easy bruising, memory loss, insomnia, anxiety, not enough sleep, decreased energy. Pertinent negatives per HPI. All others negative.   Social History   Social History  . Marital status: Widowed    Spouse name: N/A  . Number of children: 2  . Years of education: RN   Occupational History  . Retired     Social History Main Topics  . Smoking status: Former Research scientist (life sciences)  . Smokeless tobacco: Never Used     Comment: Smoked from age [age]  . Alcohol use Yes     Comment: 1 glass wine per week  . Drug use: No  . Sexual activity: Not on file   Other Topics Concern  . Not on file   Social History Narrative   Lives at [facility]    Caffeine use: none    Family History  Problem Relation Age of Onset  . Neuropathy Neg Hx     Past Medical History:  Diagnosis Date  . Cervical vertebral fusion   . Diverticulosis   . Fibromyalgia     Past Surgical History:  Procedure Laterality Date  . CERVICAL FUSION     x2  . SPINE SURGERY     Lumbar- rod placement  . TONSILLECTOMY     82y/o  . TOTAL VAGINAL HYSTERECTOMY      Current Outpatient Prescriptions  Medication Sig Dispense Refill  . Cholecalciferol (VITAMIN D PO) Take 1  capsule by mouth daily.    . folic acid (FOLVITE) 1 MG tablet Take 1 mg by mouth daily.    Marland Kitchen GARLIC PO Take 1 tablet by mouth daily.    Marland Kitchen levothyroxine (SYNTHROID, LEVOTHROID) 75 MCG tablet Take 75 mcg by mouth every morning.    Marland Kitchen LORazepam (ATIVAN) 0.5 MG tablet Take 0.5 mg by mouth 2 (two) times daily as needed. For anxiety/insomnia    . omeprazole (PRILOSEC) 20 MG capsule Take 20 mg by mouth as needed.     . zolpidem (AMBIEN) 5 MG tablet Take 2.5 mg by mouth at bedtime as needed. for sleep    . gabapentin (NEURONTIN) 300 MG capsule Take 1 capsule (300  mg total) by mouth 3 (three) times daily. 90 capsule 11   No current facility-administered medications for this visit.     Allergies as of 08/31/2016 - Review Complete 08/31/2016  Allergen Reaction Noted  . Codeine Rash 03/25/2012    Vitals: BP 140/86 (BP Location: Left Arm, Patient Position: Sitting, Cuff Size: Normal)   Pulse 87   Ht 5' (1.524 m)   Wt 94 lb (42.6 kg)   BMI 18.36 kg/m  Last Weight:  Wt Readings from Last 1 Encounters:  08/31/16 94 lb (42.6 kg)   Last Height:   Ht Readings from Last 1 Encounters:  08/31/16 5' (1.524 m)    Physical exam: Exam: Gen: NAD, conversant, frail                     CV: RRR, no MRG. No Carotid Bruits. No peripheral edema, warm, nontender Eyes: Conjunctivae clear without exudates or hemorrhage  Neuro: Detailed Neurologic Exam  Speech:    Speech is normal; fluent and spontaneous with normal comprehension.  Cognition:    The patient is oriented to person, place, and time;     recent and remote memory intact;     language fluent;     normal attention, concentration,     fund of knowledge Cranial Nerves:    The pupils are equal, round, and reactive to light. Attempted funduscopic exam could not visualize due to small pupils. Visual fields are full to finger confrontation. Extraocular movements are intact. Trigeminal sensation is intact and the muscles of mastication are normal. The face is symmetric. The palate elevates in the midline. Hearing intact. Voice is normal. Shoulder shrug is normal. The tongue has normal motion without fasciculations.   Coordination:    Normal finger to nose and heel to shin.   Gait:    Good stride in turn, not ataxic or parkinsonian  Motor Observation:    No asymmetry, no atrophy, and no involuntary movements noted. Tone:    Normal muscle tone.    Posture:    Posture is normal. normal erect    Strength:    Strength is V/V in the upper and lower limbs.      Sensation: Minimal decreased  sensation distally to temperature distally, intact pinprick and vibration and proprioception     Reflex Exam:  DTR's:    Absent ankle jerks otherwise deep tendon reflexes in the upper and lower extremities are normal bilaterally.   Toes:    The toes are downgoing bilaterally.   Clonus:    Clonus is absent.     Assessment/Plan:  A very nice 82 year old female with paresthesias just on toes #2 bilaterally of the feet. She has paresthesias on the top of the toe in the setting of wearing wedge shoes that  possibly pressed on that toe more than the others. No other toes are involved. Paresthesias started mid top of second toe and is now just on the top of that toe in both feet. 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 We'll perform a serum neuropathy screening. We can start Neurontin at bedtime to see if this helps her pain. I don't feel an EMG nerve conduction study would be high yield will hold off at this time. If symptoms progress or spread patient is to come back for follow-up. Discussed wearing proper fitting shoes. I will check a B1, ANA, IFE, heavy metals, Sjogren's, rheumatoid factor, B12, ESR.   Cc: Aretta Nip, MD   Sarina Ill, MD  [facility] [address] [place], [place] [address]  Phone [phone] Fax [phone]

## 2016-08-31 NOTE — Telephone Encounter (Signed)
Dr Jaynee Eagles- FYI  Called and spoke to [place]. Advised pt can have labs done there per Dr Jaynee Eagles. Gave fax number [phone] for them to fax results to once they come back.

## 2016-08-31 NOTE — Telephone Encounter (Signed)
Tish called to advise faxed orders received, however they are for [facility] and they are [facility], needs clarification if patient is to have labs done at [facility]? Please call to clarify [phone].

## 2016-09-01 ENCOUNTER — Telehealth: Payer: Self-pay | Admitting: *Deleted

## 2016-09-01 LAB — ANTI-NUCLEAR AB-TITER (ANA TITER): ANA Titer 1: 1:320 {titer} — ABNORMAL HIGH

## 2016-09-01 LAB — B12 AND FOLATE PANEL
Folate: 20.7 ng/mL (ref >5.4)
Vitamin B-12: 859 pg/mL (ref 200–1100)

## 2016-09-01 LAB — SEDIMENTATION RATE: Sed Rate: 1 mm/hr (ref 0–30)

## 2016-09-01 LAB — ANA: Anti Nuclear Antibody(ANA): POSITIVE — AB

## 2016-09-01 LAB — RHEUMATOID FACTOR: Rhuematoid fact SerPl-aCnc: <14 IU/mL (ref <14)

## 2016-09-01 LAB — VITAMIN B1

## 2016-09-01 LAB — SJOGRENS SYNDROME-B EXTRACTABLE NUCLEAR ANTIBODY: SSB (La) (ENA) Antibody, IgG: <1

## 2016-09-01 LAB — SJOGRENS SYNDROME-A EXTRACTABLE NUCLEAR ANTIBODY: SSA (Ro) (ENA) Antibody, IgG: <1

## 2016-09-01 NOTE — Telephone Encounter (Signed)
-----   Message from Melvenia Beam, MD sent at 08/31/2016  6:49 PM EDT ----- No acute fracture or dislocation of the second toe is observed. No significant degenerative change is observed. Nothing in the toe on xray to explain her symptoms. The toes appear osteopenic to the examiner which means reduced bone mass (which is consistent with her history of osteoporosis). Thanks.

## 2016-09-01 NOTE — Telephone Encounter (Signed)
Received fax from [facility] requesting [place] to contact them regarding specimen collection yesterday. I called [phone] opt 2. Accession number [id number].   I called and spoke to [place]. I verified lab orders that Dr Jaynee Eagles wanted. She verified they had all lab orders. She verified they had fax number given yesterday to fax results to once they are ready. Nothing further needed at this time.

## 2016-09-01 NOTE — Telephone Encounter (Signed)
Called and spoke to pt about xray results per Dr Jaynee Eagles note. Pt verbalized understanding. Has no further questions at this time. She completed her labs yesterday at outside location via [facility]. I advised I gave them [place] fax number for them to fax results to [place] once they are resulted.

## 2016-09-02 LAB — HEAVY METALS PANEL, BLOOD
Arsenic: <3 mcg/L (ref <23)
Lead: <1 ug/dL (ref <5)
Mercury, B: <4 mcg/L (ref <=10)

## 2016-09-03 LAB — PROTEIN,TOTAL AND ELECTROPHOR W/IFE
Abnormal Protein Band1: 0.2 g/dL
Albumin ELP: 3.7 g/dL — ABNORMAL LOW (ref 3.8–4.8)
Alpha-1-Globulin: 0.3 g/dL (ref 0.2–0.3)
Alpha-2-Globulin: 0.9 g/dL (ref 0.5–0.9)
Beta 2: 0.3 g/dL (ref 0.2–0.5)
Beta Globulin: 0.4 g/dL (ref 0.4–0.6)
Gamma Globulin: 0.7 g/dL — ABNORMAL LOW (ref 0.8–1.7)
Total Protein, Serum Electrophoresis: 6.3 g/dL (ref 6.1–8.1)

## 2016-09-05 LAB — VITAMIN B1, WHOLE BLOOD: Vitamin B1 (Thiamine), Blood: 119 nmol/L (ref 78–185)

## 2016-09-06 ENCOUNTER — Encounter: Payer: Self-pay | Admitting: Neurology

## 2016-09-06 DIAGNOSIS — M79676 Pain in unspecified toe(s): Secondary | ICD-10-CM | POA: Insufficient documentation

## 2016-09-06 DIAGNOSIS — R202 Paresthesia of skin: Secondary | ICD-10-CM | POA: Insufficient documentation

## 2016-09-06 HISTORY — DX: Pain in unspecified toe(s): M79.676

## 2016-09-07 ENCOUNTER — Other Ambulatory Visit: Payer: Self-pay | Admitting: Neurology

## 2016-09-07 DIAGNOSIS — R768 Other specified abnormal immunological findings in serum: Secondary | ICD-10-CM

## 2016-09-09 ENCOUNTER — Telehealth: Payer: Self-pay | Admitting: *Deleted

## 2016-09-09 DIAGNOSIS — D472 Monoclonal gammopathy: Secondary | ICD-10-CM

## 2016-09-09 NOTE — Telephone Encounter (Signed)
Done. thanks

## 2016-09-09 NOTE — Telephone Encounter (Signed)
-----   Message from Melvenia Beam, MD sent at 09/07/2016  6:27 PM EDT ----- Terrence Dupont, in one lab we found an increase in a normal component of blood. 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 Monoclonal gammopathy of undetermined significance (MGUS) is a condition in which an abnormal protein - known as monoclonal protein or M protein - is in your blood. The protein is produced in a type of white blood cell (plasma cells) in your bone marrow. MGUS usually causes no problems. But sometimes it can progress over years to other disorders,so It's important to have a checkup so that if it does progress, you get earlier treatment. If there's no disease progression, MGUS doesn't require treatment. I would like to send her results to hematology for review and they may call for an appointment thanks. If she is in agreement, please refer her to oncology for monoclonal immunoglobulin light chain. Also her ANA was positive, at her convenience I would like to do a follow up test for rheumatologic disorders. I can put in an order and she can come to [facility] and have it done at her convenience thanks. Otherwise all the other labs I did looked great thanks. After you talk to patient please place a referral to hematology for M-protein spike thanks.

## 2016-09-09 NOTE — Telephone Encounter (Signed)
Called and spoke to patient about lab results per Dr Jaynee Eagles note. Pt verbalized understanding and agreeable to be referred hematology. Advised we will place referral and someone should call her within 1-2 weeks to schedule appt. If not, advised her to call me back to ensure she gets appt. She verbalized understanding.  She will also come for further labs per Dr Jaynee Eagles recommendation to further investigate positive ANA. Advised pt how she can come get that done at our office.   Placed referral per Dr Jaynee Eagles request to hematology for MGUS evaluation.

## 2016-09-15 ENCOUNTER — Other Ambulatory Visit (INDEPENDENT_AMBULATORY_CARE_PROVIDER_SITE_OTHER): Payer: Self-pay

## 2016-09-15 DIAGNOSIS — R768 Other specified abnormal immunological findings in serum: Secondary | ICD-10-CM

## 2016-09-15 DIAGNOSIS — Z0289 Encounter for other administrative examinations: Secondary | ICD-10-CM

## 2016-09-16 LAB — ANA COMPREHENSIVE PANEL
Anti JO-1: <0.2 AI (ref 0.0–0.9)
Centromere Ab Screen: <0.2 AI (ref 0.0–0.9)
Chromatin Ab SerPl-aCnc: <0.2 AI (ref 0.0–0.9)
ENA RNP Ab: <0.2 AI (ref 0.0–0.9)
ENA SM Ab Ser-aCnc: <0.2 AI (ref 0.0–0.9)
ENA SSA (RO) Ab: <0.2 AI (ref 0.0–0.9)
ENA SSB (LA) Ab: <0.2 AI (ref 0.0–0.9)
Scleroderma SCL-70: <0.2 AI (ref 0.0–0.9)
dsDNA Ab: <1 IU/mL (ref 0–9)

## 2016-09-21 ENCOUNTER — Telehealth: Payer: Self-pay | Admitting: *Deleted

## 2016-09-21 NOTE — Telephone Encounter (Signed)
Called and spoke to pt about lab results per Dr Jaynee Eagles. She verbalized understanding. She states she feels tired on gabapentin. She feels "out of it". She states it is helping her sx and she spoke to pharmacist who advised her to try medication for at least a month. Advised pt it may take her body some time to adjust. She is agreeable to keep taking and see if sx improve. She denies falling. She will call back if this does not improve over time.

## 2016-09-21 NOTE — Telephone Encounter (Signed)
LVM for pt to call about results. Gave [facility] phone number.  

## 2016-09-21 NOTE — Telephone Encounter (Signed)
-----   Message from Melvenia Beam, MD sent at 09/21/2016 11:17 AM EDT ----- [facility] comprehensive panel was negative, no suggestion of autoimmune disorders such as lupus or scleroderma or others.

## 2016-09-21 NOTE — Telephone Encounter (Signed)
Patient returned Emma's call, please call [phone].

## 2016-12-21 ENCOUNTER — Other Ambulatory Visit: Payer: Self-pay | Admitting: Ophthalmology

## 2016-12-21 DIAGNOSIS — R52 Pain, unspecified: Secondary | ICD-10-CM

## 2016-12-24 ENCOUNTER — Ambulatory Visit
Admission: RE | Admit: 2016-12-24 | Discharge: 2016-12-24 | Disposition: A | Discharge status: Home / Self Care | Payer: Medicare Other | Source: Ambulatory Visit | Attending: Ophthalmology | Admitting: Ophthalmology

## 2016-12-24 DIAGNOSIS — R52 Pain, unspecified: Secondary | ICD-10-CM

## 2016-12-24 MED ORDER — IOPAMIDOL (ISOVUE-300) INJECTION 61%
75.0000 mL | Freq: Once | INTRAVENOUS | Status: AC | PRN
  Administered 2016-12-24: 75 mL via INTRAVENOUS

## 2017-02-09 ENCOUNTER — Other Ambulatory Visit: Payer: Self-pay | Admitting: Gastroenterology

## 2017-02-09 DIAGNOSIS — R140 Abdominal distension (gaseous): Secondary | ICD-10-CM

## 2017-02-15 ENCOUNTER — Ambulatory Visit
Admission: RE | Admit: 2017-02-15 | Discharge: 2017-02-15 | Disposition: A | Discharge status: Home / Self Care | Payer: Medicare Other | Source: Ambulatory Visit | Attending: Gastroenterology | Admitting: Gastroenterology

## 2017-02-15 DIAGNOSIS — R140 Abdominal distension (gaseous): Secondary | ICD-10-CM

## 2017-02-22 ENCOUNTER — Other Ambulatory Visit: Payer: Self-pay | Admitting: Gastroenterology

## 2017-02-22 DIAGNOSIS — R140 Abdominal distension (gaseous): Secondary | ICD-10-CM

## 2017-02-26 ENCOUNTER — Ambulatory Visit
Admission: RE | Admit: 2017-02-26 | Discharge: 2017-02-26 | Disposition: A | Discharge status: Home / Self Care | Payer: Medicare Other | Source: Ambulatory Visit | Attending: Gastroenterology | Admitting: Gastroenterology

## 2017-02-26 ENCOUNTER — Encounter (HOSPITAL_COMMUNITY): Payer: Self-pay | Admitting: *Deleted

## 2017-02-26 ENCOUNTER — Inpatient Hospital Stay (HOSPITAL_COMMUNITY)
Admission: EM | Admit: 2017-02-26 | Discharge: 2017-03-08 | DRG: 336 | Disposition: A | Discharge status: Nursing Home (Includes ICF) | Payer: Medicare Other | Attending: General Surgery | Admitting: General Surgery

## 2017-02-26 DIAGNOSIS — Z981 Arthrodesis status: Secondary | ICD-10-CM

## 2017-02-26 DIAGNOSIS — K668 Other specified disorders of peritoneum: Secondary | ICD-10-CM | POA: Diagnosis present

## 2017-02-26 DIAGNOSIS — K5651 Intestinal adhesions [bands], with partial obstruction: Secondary | ICD-10-CM | POA: Diagnosis not present

## 2017-02-26 DIAGNOSIS — K5900 Constipation, unspecified: Secondary | ICD-10-CM | POA: Diagnosis not present

## 2017-02-26 DIAGNOSIS — M797 Fibromyalgia: Secondary | ICD-10-CM | POA: Diagnosis not present

## 2017-02-26 DIAGNOSIS — E039 Hypothyroidism, unspecified: Secondary | ICD-10-CM | POA: Diagnosis not present

## 2017-02-26 DIAGNOSIS — E871 Hypo-osmolality and hyponatremia: Secondary | ICD-10-CM | POA: Diagnosis present

## 2017-02-26 DIAGNOSIS — K56609 Unspecified intestinal obstruction, unspecified as to partial versus complete obstruction: Secondary | ICD-10-CM

## 2017-02-26 DIAGNOSIS — Z87891 Personal history of nicotine dependence: Secondary | ICD-10-CM

## 2017-02-26 DIAGNOSIS — K5710 Diverticulosis of small intestine without perforation or abscess without bleeding: Secondary | ICD-10-CM | POA: Diagnosis not present

## 2017-02-26 DIAGNOSIS — R109 Unspecified abdominal pain: Secondary | ICD-10-CM

## 2017-02-26 DIAGNOSIS — R140 Abdominal distension (gaseous): Secondary | ICD-10-CM

## 2017-02-26 HISTORY — DX: Unspecified abdominal pain: R10.9

## 2017-02-26 HISTORY — DX: Other specified disorders of peritoneum: K66.8

## 2017-02-26 LAB — CBC
HCT: 37.2 % (ref 36.0–46.0)
Hemoglobin: 13 g/dL (ref 12.0–15.0)
MCH: 32.3 pg (ref 26.0–34.0)
MCHC: 34.9 g/dL (ref 30.0–36.0)
MCV: 92.5 fL (ref 78.0–100.0)
Platelets: 506 10*3/uL — ABNORMAL HIGH (ref 150–400)
RBC: 4.02 MIL/uL (ref 3.87–5.11)
RDW: 13.4 % (ref 11.5–15.5)
WBC: 10.2 10*3/uL (ref 4.0–10.5)

## 2017-02-26 LAB — COMPREHENSIVE METABOLIC PANEL
ALT: 20 U/L (ref 14–54)
AST: 29 U/L (ref 15–41)
Albumin: 4.4 g/dL (ref 3.5–5.0)
Alkaline Phosphatase: 50 U/L (ref 38–126)
Anion gap: 8 (ref 5–15)
BUN: 19 mg/dL (ref 6–20)
CO2: 23 mmol/L (ref 22–32)
Calcium: 9.5 mg/dL (ref 8.9–10.3)
Chloride: 101 mmol/L (ref 101–111)
Creatinine, Ser: 0.62 mg/dL (ref 0.44–1.00)
GFR calc Af Amer: >60 mL/min (ref >60)
GFR calc non Af Amer: >60 mL/min (ref >60)
Glucose, Bld: 94 mg/dL (ref 65–99)
Potassium: 3.7 mmol/L (ref 3.5–5.1)
Sodium: 132 mmol/L — ABNORMAL LOW (ref 135–145)
Total Bilirubin: 0.6 mg/dL (ref 0.3–1.2)
Total Protein: 7.4 g/dL (ref 6.5–8.1)

## 2017-02-26 LAB — URINALYSIS, ROUTINE W REFLEX MICROSCOPIC
Bacteria, UA: NONE SEEN
Bilirubin Urine: NEGATIVE
Glucose, UA: NEGATIVE mg/dL
Hgb urine dipstick: NEGATIVE
Ketones, ur: NEGATIVE mg/dL
Nitrite: NEGATIVE
Protein, ur: NEGATIVE mg/dL
Specific Gravity, Urine: 1.02 (ref 1.005–1.030)
pH: 6 (ref 5.0–8.0)

## 2017-02-26 LAB — LIPASE, BLOOD: Lipase: 21 U/L (ref 11–51)

## 2017-02-26 MED ORDER — ONDANSETRON 4 MG PO TBDP: 4.0000 mg | ORAL_TABLET | Freq: Four times a day (QID) | ORAL | Status: DC | PRN

## 2017-02-26 MED ORDER — MORPHINE SULFATE (PF) 4 MG/ML IV SOLN
2.0000 mg | INTRAVENOUS | Status: DC | PRN
  Administered 2017-02-26 (×3): 2 mg via INTRAVENOUS
  Administered 2017-02-27: 4 mg via INTRAVENOUS
  Filled 2017-02-26 (×4): qty 1

## 2017-02-26 MED ORDER — ONDANSETRON HCL 4 MG/2ML IJ SOLN
4.0000 mg | Freq: Four times a day (QID) | INTRAMUSCULAR | Status: DC | PRN
  Administered 2017-03-03: 4 mg via INTRAVENOUS
  Filled 2017-02-26: qty 2

## 2017-02-26 MED ORDER — KCL IN DEXTROSE-NACL 20-5-0.45 MEQ/L-%-% IV SOLN
INTRAVENOUS | Status: DC
  Administered 2017-02-26 – 2017-02-28 (×4): via INTRAVENOUS
  Administered 2017-03-03: 1000 mL via INTRAVENOUS
  Administered 2017-03-03: 18:00:00 via INTRAVENOUS
  Filled 2017-02-26 (×10): qty 1000

## 2017-02-26 MED ORDER — ENOXAPARIN SODIUM 30 MG/0.3ML ~~LOC~~ SOLN
30.0000 mg | SUBCUTANEOUS | Status: DC
  Administered 2017-02-26 – 2017-03-03 (×6): 30 mg via SUBCUTANEOUS
  Filled 2017-02-26 (×6): qty 0.3

## 2017-02-26 MED ORDER — PIPERACILLIN-TAZOBACTAM 3.375 G IVPB
3.3750 g | Freq: Three times a day (TID) | INTRAVENOUS | Status: DC
  Administered 2017-02-26 – 2017-02-28 (×5): 3.375 g via INTRAVENOUS
  Filled 2017-02-26 (×5): qty 50

## 2017-02-26 MED ORDER — PIPERACILLIN-TAZOBACTAM 3.375 G IVPB 30 MIN
3.3750 g | Freq: Once | INTRAVENOUS | Status: AC
  Administered 2017-02-26: 3.375 g via INTRAVENOUS
  Filled 2017-02-26: qty 50

## 2017-02-26 MED ORDER — IOPAMIDOL (ISOVUE-300) INJECTION 61%
80.0000 mL | Freq: Once | INTRAVENOUS | Status: AC | PRN
  Administered 2017-02-26: 80 mL via INTRAVENOUS

## 2017-02-26 NOTE — Progress Notes (Signed)
Multiple attempts to contact radiology but was transferred to answering machine that was not set up. I reached Dr Ardeen Garland via [facility] radiology number and discussed with him. Pt with long history of bloating followed by Dr Amedeo Plenty felt to be due to bacterial overgrowth and gluten intolerence(TTG normal). Has been treated in past with  Various antibiotics with good results. Was seen by Dr Amedeo Plenty 2/26 with mild distension no pain or tenderness and started on Flagyl and the CT was ordered. We will be available as needed.

## 2017-02-26 NOTE — H&P (Signed)
Sara Davila is an 83 y.o. female.   Chief Complaint: abnormal CT findings HPI: 83 y.o. F with 2-3 months of abdominal distention who presents to the ED for evaluation after CT scan performed this am shows pneumoperitoneum.  Pt states there has been no change in her abdominal distention or pain recently.  She is tolerating a diet and having bowel function.  She denies nausea or fevers.    Past Medical History:  Diagnosis Date  . Cervical vertebral fusion   . Diverticulosis   . Fibromyalgia     Past Surgical History:  Procedure Laterality Date  . CERVICAL FUSION     x2  . SPINE SURGERY     Lumbar- rod placement  . TONSILLECTOMY     83y/o  . TOTAL VAGINAL HYSTERECTOMY      Family History  Problem Relation Age of Onset  . Neuropathy Neg Hx    Social History:  reports that she has quit smoking. She has never used smokeless tobacco. She reports that she drinks alcohol. She reports that she does not use drugs.  Allergies:  Allergies  Allergen Reactions  . Codeine Rash     (Not in a hospital admission)  Results for orders placed or performed during the hospital encounter of 02/26/17 (from the past 48 hour(s))  Lipase, blood     Status: None   Collection Time: 02/26/17  1:14 PM  Result Value Ref Range   Lipase 21 11 - 51 U/L  Comprehensive metabolic panel     Status: Abnormal   Collection Time: 02/26/17  1:14 PM  Result Value Ref Range   Sodium 132 (L) 135 - 145 mmol/L   Potassium 3.7 3.5 - 5.1 mmol/L   Chloride 101 101 - 111 mmol/L   CO2 23 22 - 32 mmol/L   Glucose, Bld 94 65 - 99 mg/dL   BUN 19 6 - 20 mg/dL   Creatinine, Ser 0.62 0.44 - 1.00 mg/dL   Calcium 9.5 8.9 - 10.3 mg/dL   Total Protein 7.4 6.5 - 8.1 g/dL   Albumin 4.4 3.5 - 5.0 g/dL   AST 29 15 - 41 U/L   ALT 20 14 - 54 U/L   Alkaline Phosphatase 50 38 - 126 U/L   Total Bilirubin 0.6 0.3 - 1.2 mg/dL   GFR calc non Af Amer >60 >60 mL/min   GFR calc Af Amer >60 >60 mL/min    Comment: (NOTE) The eGFR has  been calculated using the CKD EPI equation. This calculation has not been validated in all clinical situations. eGFR's persistently <60 mL/min signify possible Chronic Kidney Disease.    Anion gap 8 5 - 15  CBC     Status: Abnormal   Collection Time: 02/26/17  1:14 PM  Result Value Ref Range   WBC 10.2 4.0 - 10.5 K/uL   RBC 4.02 3.87 - 5.11 MIL/uL   Hemoglobin 13.0 12.0 - 15.0 g/dL   HCT 37.2 36.0 - 46.0 %   MCV 92.5 78.0 - 100.0 fL   MCH 32.3 26.0 - 34.0 pg   MCHC 34.9 30.0 - 36.0 g/dL   RDW 13.4 11.5 - 15.5 %   Platelets 506 (H) 150 - 400 K/uL   Ct Abdomen Pelvis W Contrast  Addendum Date: 02/26/2017   ADDENDUM REPORT: 02/26/2017 12:08 ADDENDUM: Multiple attempts were made to contact Dr. Oletta Lamas, the doctor on-call for Dr. Amedeo Plenty, but were unsuccessful. Attempts were also made to contact Dr. Amedeo Plenty, unsuccessfully. Therefore, I called  the patient myself at 11:20 a.m. on 02/26/2017, and instructed her to go to the emergency room for further treatment and evaluation. She wanted to go to [facility], therefore I called and spoke to Dr. Zenia Resides regarding the patient. After speaking with Dr. Zenia Resides, Dr. Oletta Lamas called back, and I also gave him the results at 11:30 a.m. Electronically Signed   By: Rolm Baptise M.D.   On: 02/26/2017 12:08   Result Date: 02/26/2017 CLINICAL DATA:  Bloating, abdominal distention. EXAM: CT ABDOMEN AND PELVIS WITH CONTRAST TECHNIQUE: Multidetector CT imaging of the abdomen and pelvis was performed using the standard protocol following bolus administration of intravenous contrast. CONTRAST:  80 cc Isovue 300 IV COMPARISON:  03/28/2012 FINDINGS: Lower chest: Scarring in the lung bases. Suspect COPD. No acute findings. Hepatobiliary: Numerous scattered low-density lesions in the liver, most compatible with small cysts. Pancreas: No focal abnormality or ductal dilatation. Spleen: No focal abnormality.  Normal size. Adrenals/Urinary Tract: No adrenal abnormality. No  focal renal abnormality. No stones or hydronephrosis. Urinary bladder is unremarkable. Stomach/Bowel: Large amount of stool noted throughout the colon. Stomach is decompressed as is the small bowel, grossly unremarkable. Vascular/Lymphatic: Scattered aortic and iliac calcifications. No aneurysm or adenopathy. Reproductive: Prior hysterectomy.  No adnexal masses. Other: Large amount of free air in the abdomen and pelvis layering anteriorly. Exact source is not identified. No definite inflammatory process or apple normal bowel wall noted. Free fluid noted in the pelvis and adjacent to the liver and spleen. Musculoskeletal: No acute bony abnormality. Postoperative changes from posterior fusion in the lumbosacral spine. IMPRESSION: Large amount of pneumoperitoneum in the abdomen or pelvis compatible with bowel perforation. Exact source is not identified. No abnormal bowel wall area noted. There is a large amount stool within the colon. Small to mild free fluid in the abdomen and pelvis. Multiple hepatic cysts. Attempts are being made to contact Dr. Amedeo Plenty at this time. Electronically Signed: By: Rolm Baptise M.D. On: 02/26/2017 09:58    Review of Systems  Constitutional: Negative for chills and fever.  HENT: Negative for congestion and hearing loss.   Eyes: Negative for blurred vision and double vision.  Respiratory: Negative for cough and shortness of breath.   Cardiovascular: Negative for chest pain and palpitations.  Gastrointestinal: Positive for abdominal pain and constipation. Negative for nausea and vomiting.  Genitourinary: Negative for dysuria and urgency.  Neurological: Negative for dizziness and headaches.    Blood pressure 132/73, pulse 81, temperature 97.7 F (36.5 C), temperature source Oral, resp. rate 18, SpO2 97 %. Physical Exam  Constitutional: She is oriented to person, place, and time. She appears well-developed and well-nourished. No distress.  HENT:  Head: Normocephalic and  atraumatic.  Eyes: Conjunctivae and EOM are normal. Pupils are equal, round, and reactive to light.  Neck: Normal range of motion. Neck supple.  Cardiovascular: Normal rate and regular rhythm.   Respiratory: Breath sounds normal. She is in respiratory distress.  GI: Soft. She exhibits distension. There is tenderness (mild RLQ tenderness).  Neurological: She is alert and oriented to person, place, and time.  Skin: Skin is warm and dry. She is not diaphoretic.     Assessment/Plan 83 y.o. F with chronic abdominal pain and constipation.  CT performed to evaluate fluid in her abdomen on previous US shows pneumoperitoneum without extravagation of contrast.  She has no symptoms of an acute abdomen.  She is not tachycardic or hypotensive.  She has no peritonitis.  Lab work is normal.  I do not see any reason for acute surgical intervention as the risk of surgery does not outweigh any benefits.  Will admit to floor and have hospitalist consult for her medical issues.  Will start IV antibiotics and bowel rest.  Will follow closely and repeat lab work in AM.  If pt's condition worsens, will have low threshold to proceed to the OR for laparoscopy.    Rosario Adie., MD 06/05/6150, 2:40 PM

## 2017-02-26 NOTE — ED Notes (Signed)
PT have been made aware of urine sample will info stuff when able to provide

## 2017-02-26 NOTE — ED Triage Notes (Signed)
Pt had CT performed today showing perforation to abdomen. Pt states she has had abdominal pain and distension for the past few months.

## 2017-02-26 NOTE — ED Provider Notes (Signed)
[facility] DEPT Provider Note   CSN: [id number] Arrival date & time: 02/26/17  1256     History   Chief Complaint Chief Complaint  Patient presents with  . Abdominal Pain    sent by PCP for abnormal CT    HPI Sara Davila is a 83 y.o. female.  83 year old female presents with several weeks of abdominal discomfort and bloating. Had an outpatient CT done by her physician today and those results were called to me by radiologist which showed the patient to have pneumoperitoneum. She denies any fever or chills. No vomiting or diarrhea noted. Pain characterized as crampy and diffuse and persistent. Nothing makes it better or worse pain or treatment use prior to arrival.      Past Medical History:  Diagnosis Date  . Cervical vertebral fusion   . Diverticulosis   . Fibromyalgia     Patient Active Problem List   Diagnosis Date Noted  . Toe pain 09/06/2016  . Paresthesias 09/06/2016  . Fall 03/29/2012  . Right rib fracture 03/29/2012  . Peritoneal free air 03/29/2012    Past Surgical History:  Procedure Laterality Date  . CERVICAL FUSION     x2  . SPINE SURGERY     Lumbar- rod placement  . TONSILLECTOMY     83y/o  . TOTAL VAGINAL HYSTERECTOMY      OB History    No data available       Home Medications    Prior to Admission medications   Medication Sig Start Date End Date Taking? Authorizing Provider  Cholecalciferol (VITAMIN D PO) Take 1 capsule by mouth daily.    Historical Provider, MD  folic acid (FOLVITE) 1 MG tablet Take 1 mg by mouth daily.    Historical Provider, MD  gabapentin (NEURONTIN) 300 MG capsule Take 1 capsule (300 mg total) by mouth 3 (three) times daily. 08/31/16   Melvenia Beam, MD  GARLIC PO Take 1 tablet by mouth daily.    Historical Provider, MD  levothyroxine (SYNTHROID, LEVOTHROID) 75 MCG tablet Take 75 mcg by mouth every morning.    Historical Provider, MD  LORazepam (ATIVAN) 0.5 MG tablet Take 0.5 mg by mouth 2 (two) times  daily as needed. For anxiety/insomnia    Historical Provider, MD  omeprazole (PRILOSEC) 20 MG capsule Take 20 mg by mouth as needed.     Historical Provider, MD  zolpidem (AMBIEN) 5 MG tablet Take 2.5 mg by mouth at bedtime as needed. for sleep    Historical Provider, MD    Family History Family History  Problem Relation Age of Onset  . Neuropathy Neg Hx     Social History Social History  Substance Use Topics  . Smoking status: Former Research scientist (life sciences)  . Smokeless tobacco: Never Used     Comment: Smoked from age [age]  . Alcohol use Yes     Comment: 1 glass wine per week     Allergies   Codeine   Review of Systems Review of Systems  All other systems reviewed and are negative.    Physical Exam Updated Vital Signs BP (!) 142/78   Pulse 86   Temp 97.7 F (36.5 C) (Oral)   Resp 18   SpO2 96%   Physical Exam  Constitutional: She is oriented to person, place, and time. She appears well-developed and well-nourished.  Non-toxic appearance. No distress.  HENT:  Head: Normocephalic and atraumatic.  Eyes: Conjunctivae, EOM and lids are normal. Pupils are equal, round, and reactive to  light.  Neck: Normal range of motion. Neck supple. No tracheal deviation present. No thyroid mass present.  Cardiovascular: Normal rate, regular rhythm and normal heart sounds.  Exam reveals no gallop.   No murmur heard. Pulmonary/Chest: Effort normal and breath sounds normal. No stridor. No respiratory distress. She has no decreased breath sounds. She has no wheezes. She has no rhonchi. She has no rales.  Abdominal: Soft. Normal appearance and bowel sounds are normal. She exhibits distension. There is generalized tenderness. There is no rebound and no CVA tenderness.  Musculoskeletal: Normal range of motion. She exhibits no edema or tenderness.  Neurological: She is alert and oriented to person, place, and time. She has normal strength. No cranial nerve deficit or sensory deficit. GCS eye subscore is 4.  GCS verbal subscore is 5. GCS motor subscore is 6.  Skin: Skin is warm and dry. No abrasion and no rash noted.  Psychiatric: She has a normal mood and affect. Her speech is normal and behavior is normal.  Nursing note and vitals reviewed.    ED Treatments / Results  Labs (all labs ordered are listed, but only abnormal results are displayed) Labs Reviewed  CBC - Abnormal; Notable for the following:       Result Value   Platelets 506 (*)    All other components within normal limits  LIPASE, BLOOD  COMPREHENSIVE METABOLIC PANEL  URINALYSIS, ROUTINE W REFLEX MICROSCOPIC    EKG  EKG Interpretation None       Radiology Ct Abdomen Pelvis W Contrast  Addendum Date: 02/26/2017   ADDENDUM REPORT: 02/26/2017 12:08 ADDENDUM: Multiple attempts were made to contact Dr. Oletta Lamas, the doctor on-call for Dr. Amedeo Plenty, but were unsuccessful. Attempts were also made to contact Dr. Amedeo Plenty, unsuccessfully. Therefore, I called the patient myself at 11:20 a.m. on 02/26/2017, and instructed her to go to the emergency room for further treatment and evaluation. She wanted to go to [facility], therefore I called and spoke to Dr. Zenia Resides regarding the patient. After speaking with Dr. Zenia Resides, Dr. Oletta Lamas called back, and I also gave him the results at 11:30 a.m. Electronically Signed   By: Rolm Baptise M.D.   On: 02/26/2017 12:08   Result Date: 02/26/2017 CLINICAL DATA:  Bloating, abdominal distention. EXAM: CT ABDOMEN AND PELVIS WITH CONTRAST TECHNIQUE: Multidetector CT imaging of the abdomen and pelvis was performed using the standard protocol following bolus administration of intravenous contrast. CONTRAST:  80 cc Isovue 300 IV COMPARISON:  03/28/2012 FINDINGS: Lower chest: Scarring in the lung bases. Suspect COPD. No acute findings. Hepatobiliary: Numerous scattered low-density lesions in the liver, most compatible with small cysts. Pancreas: No focal abnormality or ductal dilatation. Spleen: No focal  abnormality.  Normal size. Adrenals/Urinary Tract: No adrenal abnormality. No focal renal abnormality. No stones or hydronephrosis. Urinary bladder is unremarkable. Stomach/Bowel: Large amount of stool noted throughout the colon. Stomach is decompressed as is the small bowel, grossly unremarkable. Vascular/Lymphatic: Scattered aortic and iliac calcifications. No aneurysm or adenopathy. Reproductive: Prior hysterectomy.  No adnexal masses. Other: Large amount of free air in the abdomen and pelvis layering anteriorly. Exact source is not identified. No definite inflammatory process or apple normal bowel wall noted. Free fluid noted in the pelvis and adjacent to the liver and spleen. Musculoskeletal: No acute bony abnormality. Postoperative changes from posterior fusion in the lumbosacral spine. IMPRESSION: Large amount of pneumoperitoneum in the abdomen or pelvis compatible with bowel perforation. Exact source is not identified. No abnormal bowel wall area  noted. There is a large amount stool within the colon. Small to mild free fluid in the abdomen and pelvis. Multiple hepatic cysts. Attempts are being made to contact Dr. Amedeo Plenty at this time. Electronically Signed: By: Rolm Baptise M.D. On: 02/26/2017 09:58    Procedures Procedures (including critical care time)  Medications Ordered in ED Medications  piperacillin-tazobactam (ZOSYN) IVPB 3.375 g (not administered)     Initial Impression / Assessment and Plan / ED Course  I have reviewed the triage vital signs and the nursing notes.  Pertinent labs & imaging results that were available during my care of the patient were reviewed by me and considered in my medical decision making (see chart for details).     Patient started on IV antibiotics here. Will consult general surgery for admission  Final Clinical Impressions(s) / ED Diagnoses   Final diagnoses:  None    New Prescriptions New Prescriptions   No medications on file     Lacretia Leigh, MD 02/26/17 1337

## 2017-02-26 NOTE — Consult Note (Addendum)
Medical Consultation   Sara Davila  HCW:[id number]  DOB: 02-06-1930  DOA: 02/26/2017  PCP: Aretta Nip, MD    Requesting physician: Dr. Grandville Silos  Reason for consultation: Medical manegement   History of Present Illness: Sara Davila is an 83 y.o. female with past medical history of hyperthyroidism presented with abdominal pain and distention for the past 3 months saw her gastroenterologist order CT scan showed pneumoperitoneum was sent to the ED. Surgery will admit the patient and they consulted [place] for medical management.   Review of Systems:  ROS As per HPI otherwise 10 point review of systems negative.     Past Medical History: Past Medical History:  Diagnosis Date  . Cervical vertebral fusion   . Diverticulosis   . Fibromyalgia     Past Surgical History: Past Surgical History:  Procedure Laterality Date  . CERVICAL FUSION     x2  . SPINE SURGERY     Lumbar- rod placement  . TONSILLECTOMY     83y/o  . TOTAL VAGINAL HYSTERECTOMY       Allergies:   Allergies  Allergen Reactions  . Codeine Rash     Social History:  reports that she has quit smoking. She has never used smokeless tobacco. She reports that she drinks alcohol. She reports that she does not use drugs.   Family History: Family History  Problem Relation Age of Onset  . Neuropathy Neg Hx       Physical Exam: Vitals:   02/26/17 1302 02/26/17 1345 02/26/17 1400 02/26/17 1430  BP: (!) 142/78 132/73 127/66 120/68  Pulse: 86 81 83 (!) 50  Resp: 18     Temp: 97.7 F (36.5 C)     TempSrc: Oral     SpO2: 96% 97% 97% 98%    Constitutional: She is awake alert and oriented 3 in mild discomfort Eyes: He was equally round and reactive to light ENMT: Trachea Is midline, dry mucous membrane.     Neck: No JVD CVS: Regular rate and rhythm positive S1-S2 Respiratory:  Good air movement her to auscultation Abdomen: Abdomen is hard significantly distended positive  bowel sounds with diffuse tenderness some mild rebound Musculoskeletal: Intact Neuro: She is awake alert oriented 3 cranial filming were severe 12 grossly intact sensation is intact throughout muscle strength 504 extremities, deep tendon reflexes symmetrical in bilateral Skin: No rashes or ulceration  Data reviewed:  I have personally reviewed following labs and imaging studies Labs:  CBC:  Recent Labs Lab 02/26/17 1314  WBC 10.2  HGB 13.0  HCT 37.2  MCV 92.5  PLT 506*    Basic Metabolic Panel:  Recent Labs Lab 02/26/17 1314  NA 132*  K 3.7  CL 101  CO2 23  GLUCOSE 94  BUN 19  CREATININE 0.62  CALCIUM 9.5   GFR CrCl cannot be calculated (Unknown ideal weight.). Liver Function Tests:  Recent Labs Lab 02/26/17 1314  AST 29  ALT 20  ALKPHOS 50  BILITOT 0.6  PROT 7.4  ALBUMIN 4.4    Recent Labs Lab 02/26/17 1314  LIPASE 21   No results for input(s): AMMONIA in the last 168 hours. Coagulation profile No results for input(s): INR, PROTIME in the last 168 hours.  Cardiac Enzymes: No results for input(s): CKTOTAL, CKMB, CKMBINDEX, TROPONINI in the last 168 hours. BNP: Invalid input(s): POCBNP CBG: No results for input(s): GLUCAP in the last 168  hours. D-Dimer No results for input(s): DDIMER in the last 72 hours. Hgb A1c No results for input(s): HGBA1C in the last 72 hours. Lipid Profile No results for input(s): CHOL, HDL, LDLCALC, TRIG, CHOLHDL, LDLDIRECT in the last 72 hours. Thyroid function studies No results for input(s): TSH, T4TOTAL, T3FREE, THYROIDAB in the last 72 hours.  Invalid input(s): FREET3 Anemia work up No results for input(s): VITAMINB12, FOLATE, FERRITIN, TIBC, IRON, RETICCTPCT in the last 72 hours. Urinalysis No results found for: COLORURINE, APPEARANCEUR, LABSPEC, [place], GLUCOSEU, HGBUR, BILIRUBINUR, KETONESUR, PROTEINUR, UROBILINOGEN, NITRITE, [place]   Microbiology No results found for this or any previous visit  (from the past 240 hour(s)).     Inpatient Medications:   Scheduled Meds: Continuous Infusions:   Radiological Exams on Admission: Ct Abdomen Pelvis W Contrast  Addendum Date: 02/26/2017   ADDENDUM REPORT: 02/26/2017 12:08 ADDENDUM: Multiple attempts were made to contact Dr. Oletta Lamas, the doctor on-call for Dr. Amedeo Plenty, but were unsuccessful. Attempts were also made to contact Dr. Amedeo Plenty, unsuccessfully. Therefore, I called the patient myself at 11:20 a.m. on 02/26/2017, and instructed her to go to the emergency room for further treatment and evaluation. She wanted to go to [facility], therefore I called and spoke to Dr. Zenia Resides regarding the patient. After speaking with Dr. Zenia Resides, Dr. Oletta Lamas called back, and I also gave him the results at 11:30 a.m. Electronically Signed   By: Rolm Baptise M.D.   On: 02/26/2017 12:08   Result Date: 02/26/2017 CLINICAL DATA:  Bloating, abdominal distention. EXAM: CT ABDOMEN AND PELVIS WITH CONTRAST TECHNIQUE: Multidetector CT imaging of the abdomen and pelvis was performed using the standard protocol following bolus administration of intravenous contrast. CONTRAST:  80 cc Isovue 300 IV COMPARISON:  03/28/2012 FINDINGS: Lower chest: Scarring in the lung bases. Suspect COPD. No acute findings. Hepatobiliary: Numerous scattered low-density lesions in the liver, most compatible with small cysts. Pancreas: No focal abnormality or ductal dilatation. Spleen: No focal abnormality.  Normal size. Adrenals/Urinary Tract: No adrenal abnormality. No focal renal abnormality. No stones or hydronephrosis. Urinary bladder is unremarkable. Stomach/Bowel: Large amount of stool noted throughout the colon. Stomach is decompressed as is the small bowel, grossly unremarkable. Vascular/Lymphatic: Scattered aortic and iliac calcifications. No aneurysm or adenopathy. Reproductive: Prior hysterectomy.  No adnexal masses. Other: Large amount of free air in the abdomen and pelvis layering  anteriorly. Exact source is not identified. No definite inflammatory process or apple normal bowel wall noted. Free fluid noted in the pelvis and adjacent to the liver and spleen. Musculoskeletal: No acute bony abnormality. Postoperative changes from posterior fusion in the lumbosacral spine. IMPRESSION: Large amount of pneumoperitoneum in the abdomen or pelvis compatible with bowel perforation. Exact source is not identified. No abnormal bowel wall area noted. There is a large amount stool within the colon. Small to mild free fluid in the abdomen and pelvis. Multiple hepatic cysts. Attempts are being made to contact Dr. Amedeo Plenty at this time. Electronically Signed: By: Rolm Baptise M.D. On: 02/26/2017 09:58    Impression/Recommendations Abdominal pain/ Pneumoperitoneum Management per surgery. Pain control per surgery   Mild hyponatremia: Likely hypovelemia vs pain in etiology started on IV fluids recheck a b-met in the morning.  Hypothyroidism Continue Synthroid.  Insomnia: Continue Ambien and benzodiazepine when necessary.  Thank you for this consultation.  Our [facility] hospitalist team will follow the patient with you.   Time Spent: 65 min  Charlynne Cousins M.D. [facility] 02/26/2017, 3:02 PM

## 2017-02-26 NOTE — ED Notes (Signed)
Hospitalist at bedside 

## 2017-02-27 ENCOUNTER — Inpatient Hospital Stay (HOSPITAL_COMMUNITY): Payer: Medicare Other

## 2017-02-27 LAB — BASIC METABOLIC PANEL
Anion gap: 6 (ref 5–15)
BUN: 15 mg/dL (ref 6–20)
CO2: 22 mmol/L (ref 22–32)
Calcium: 8.5 mg/dL — ABNORMAL LOW (ref 8.9–10.3)
Chloride: 104 mmol/L (ref 101–111)
Creatinine, Ser: 0.68 mg/dL (ref 0.44–1.00)
GFR calc Af Amer: >60 mL/min (ref >60)
GFR calc non Af Amer: >60 mL/min (ref >60)
Glucose, Bld: 111 mg/dL — ABNORMAL HIGH (ref 65–99)
Potassium: 4 mmol/L (ref 3.5–5.1)
Sodium: 132 mmol/L — ABNORMAL LOW (ref 135–145)

## 2017-02-27 LAB — CBC
HCT: 33.5 % — ABNORMAL LOW (ref 36.0–46.0)
Hemoglobin: 11.6 g/dL — ABNORMAL LOW (ref 12.0–15.0)
MCH: 32.6 pg (ref 26.0–34.0)
MCHC: 34.6 g/dL (ref 30.0–36.0)
MCV: 94.1 fL (ref 78.0–100.0)
Platelets: 436 10*3/uL — ABNORMAL HIGH (ref 150–400)
RBC: 3.56 MIL/uL — ABNORMAL LOW (ref 3.87–5.11)
RDW: 13.7 % (ref 11.5–15.5)
WBC: 7 10*3/uL (ref 4.0–10.5)

## 2017-02-27 MED ORDER — BISACODYL 10 MG RE SUPP
10.0000 mg | Freq: Every day | RECTAL | Status: DC | PRN
  Administered 2017-02-27 – 2017-03-06 (×2): 10 mg via RECTAL
  Filled 2017-02-27 (×2): qty 1

## 2017-02-27 MED ORDER — PANTOPRAZOLE SODIUM 40 MG PO TBEC
40.0000 mg | DELAYED_RELEASE_TABLET | Freq: Every day | ORAL | Status: DC
  Administered 2017-02-27 – 2017-03-03 (×5): 40 mg via ORAL
  Filled 2017-02-27 (×5): qty 1

## 2017-02-27 MED ORDER — POLYETHYLENE GLYCOL 3350 17 G PO PACK
34.0000 g | PACK | Freq: Two times a day (BID) | ORAL | Status: DC
Start: 2017-02-27 — End: 2017-03-02
  Administered 2017-02-27 – 2017-03-01 (×6): 34 g via ORAL
  Filled 2017-02-27 (×6): qty 2

## 2017-02-27 MED ORDER — RISAQUAD PO CAPS
1.0000 | ORAL_CAPSULE | Freq: Every day | ORAL | Status: DC
  Administered 2017-02-27 – 2017-03-08 (×9): 1 via ORAL
  Filled 2017-02-27 (×10): qty 1

## 2017-02-27 MED ORDER — LEVOTHYROXINE SODIUM 75 MCG PO TABS
75.0000 ug | ORAL_TABLET | Freq: Every day | ORAL | Status: DC
  Administered 2017-02-27 – 2017-03-08 (×9): 75 ug via ORAL
  Filled 2017-02-27 (×9): qty 1

## 2017-02-27 MED ORDER — ZOLPIDEM TARTRATE 5 MG PO TABS
2.5000 mg | ORAL_TABLET | Freq: Every evening | ORAL | Status: DC | PRN
  Administered 2017-03-03 – 2017-03-05 (×3): 2.5 mg via ORAL
  Filled 2017-02-27 (×3): qty 1

## 2017-02-27 NOTE — Consult Note (Signed)
Sara Davila is an 83 y.o. female.  HPI: patients recent history noted. I went back to the office and reviewed her extensive medical records. She has been seeing Dr. Amedeo Plenty for number of years. She's had a long history of abdominal bloating that has been worked up extensively. She was evaluated at [facility] in the 1990s as well as by Dr. Amedeo Plenty here in [place] and no clear etiology was ever determine. She improved with antibiotics and laxatives. In 2001 she moved to [place] and lived in [place] for 11 years During that time she was admitted to the hospital worked up extensively at [facility] in [place]. She moved back to [facility] in 2011 with a diagnosis of small bowel diverticulosis with recurrent episodes of diverticulitis of the small bowel. She continued to have the same symptoms and is seen Dr. Amedeo Plenty for this number of times. She's been treated with antibiotics of various types for presumed small bowel diverticulosis and small bowel overgrowth with improvement. She is intolerant of gluten but has been worked up in 3 different locations for celiac disease including small bowel biopsies and celiac antibodies of all been negative. She clinically has done much better on a gluten-free diet. She saw Dr. Amedeo Plenty recently after an absence of 3 years due to increasing symptoms. This increase in symptoms have been going on for about 6 months. Normally, she has bloating and difficulty passing stools worsen by eating but usually improved after taking magnesium citrate. When she had episodes of worsening bloating and discomfort the presumption had been that she may have had small bowel diverticulitis or small bowel bacterial overgrowth  again and she was treated with antibiotics.she was given course of rifaximin Flagyl recently by Dr. Amedeo Plenty without much improvement and there was concerned that she  may have fluid in her abdomen , [place] was ordered showing a small amount of ascitic fluid, no gallstones. This led to the CT scan which showed pneumoperitoneum the patient was admitted to hospital. She notes that she has had no fever, chills or severe pain but continued bloating. She hasn't had a bowel movement in a couple of days but has passed gas. Feels a little better now than when she was admitted.   Past Medical History:  Diagnosis Date  . Cervical vertebral fusion   . Diverticulosis   . Fibromyalgia   . Hyperthyroidism   . Pneumoperitoneum 02/26/2017    Past Surgical History:  Procedure Laterality Date  . CERVICAL FUSION     x2  . SPINE SURGERY     Lumbar- rod placement  . TONSILLECTOMY     83y/o  . TOTAL VAGINAL HYSTERECTOMY      Family History  Problem Relation Age of Onset  . Neuropathy Neg Hx     Social History:  reports that she has quit smoking. She has never used smokeless tobacco. She reports that she drinks alcohol. She reports that she does not use drugs.  Allergies:  Allergies  Allergen Reactions  . Codeine Rash    Medications; Prior to Admission medications   Medication Sig Start Date End Date Taking? Authorizing Provider  calcium carbonate (TUMS - DOSED IN MG ELEMENTAL CALCIUM) 500 MG chewable tablet Chew 1 tablet by mouth 2 (two) times daily with a meal.   Yes Historical Provider, MD  Carboxymethylcellulose Sodium (THERATEARS OP) Apply 1 drop to eye 2 (two) times daily as needed (dry eyes).  Yes Historical Provider, MD  Cholecalciferol (VITAMIN D PO) Take 1 capsule by mouth daily.   Yes Historical Provider, MD  GARLIC PO Take 1 tablet by mouth daily.   Yes Historical Provider, MD  levothyroxine (SYNTHROID, LEVOTHROID) 75 MCG tablet Take 75 mcg by mouth daily before breakfast.    Yes Historical Provider, MD  LORazepam (ATIVAN) 0.5 MG tablet Take 0.75 mg by mouth at bedtime as needed for anxiety or sleep.    Yes Historical Provider, MD  omeprazole  (PRILOSEC) 20 MG capsule Take 20 mg by mouth daily.    Yes Historical Provider, MD  Probiotic Product (PROBIOTIC-10) CAPS Take 1 capsule by mouth daily.   Yes Historical Provider, MD  zolpidem (AMBIEN) 5 MG tablet Take 2.5 mg by mouth at bedtime as needed for sleep.    Yes Historical Provider, MD  gabapentin (NEURONTIN) 300 MG capsule Take 1 capsule (300 mg total) by mouth 3 (three) times daily. Patient not taking: Reported on 02/26/2017 08/31/16   Melvenia Beam, MD   . enoxaparin (LOVENOX) injection  30 mg Subcutaneous Q24H  . piperacillin-tazobactam (ZOSYN)  IV  3.375 g Intravenous Q8H   PRN Meds morphine injection, ondansetron **OR** ondansetron (ZOFRAN) IV Results for orders placed or performed during the hospital encounter of 02/26/17 (from the past 48 hour(s))  Lipase, blood     Status: None   Collection Time: 02/26/17  1:14 PM  Result Value Ref Range   Lipase 21 11 - 51 U/L  Comprehensive metabolic panel     Status: Abnormal   Collection Time: 02/26/17  1:14 PM  Result Value Ref Range   Sodium 132 (L) 135 - 145 mmol/L   Potassium 3.7 3.5 - 5.1 mmol/L   Chloride 101 101 - 111 mmol/L   CO2 23 22 - 32 mmol/L   Glucose, Bld 94 65 - 99 mg/dL   BUN 19 6 - 20 mg/dL   Creatinine, Ser 0.62 0.44 - 1.00 mg/dL   Calcium 9.5 8.9 - 10.3 mg/dL   Total Protein 7.4 6.5 - 8.1 g/dL   Albumin 4.4 3.5 - 5.0 g/dL   AST 29 15 - 41 U/L   ALT 20 14 - 54 U/L   Alkaline Phosphatase 50 38 - 126 U/L   Total Bilirubin 0.6 0.3 - 1.2 mg/dL   GFR calc non Af Amer >60 >60 mL/min   GFR calc Af Amer >60 >60 mL/min    Comment: (NOTE) The eGFR has been calculated using the CKD EPI equation. This calculation has not been validated in all clinical situations. eGFR's persistently <60 mL/min signify possible Chronic Kidney Disease.    Anion gap 8 5 - 15  CBC     Status: Abnormal   Collection Time: 02/26/17  1:14 PM  Result Value Ref Range   WBC 10.2 4.0 - 10.5 K/uL   RBC 4.02 3.87 - 5.11 MIL/uL    Hemoglobin 13.0 12.0 - 15.0 g/dL   HCT 37.2 36.0 - 46.0 %   MCV 92.5 78.0 - 100.0 fL   MCH 32.3 26.0 - 34.0 pg   MCHC 34.9 30.0 - 36.0 g/dL   RDW 13.4 11.5 - 15.5 %   Platelets 506 (H) 150 - 400 K/uL  Urinalysis, Routine w reflex microscopic     Status: Abnormal   Collection Time: 02/26/17  2:50 PM  Result Value Ref Range   Color, Urine STRAW (A) YELLOW   APPearance CLEAR CLEAR   Specific Gravity, Urine 1.020 1.005 - 1.030  pH 6.0 5.0 - 8.0   Glucose, UA NEGATIVE NEGATIVE mg/dL   Hgb urine dipstick NEGATIVE NEGATIVE   Bilirubin Urine NEGATIVE NEGATIVE   Ketones, ur NEGATIVE NEGATIVE mg/dL   Protein, ur NEGATIVE NEGATIVE mg/dL   Nitrite NEGATIVE NEGATIVE   Leukocytes, UA SMALL (A) NEGATIVE   RBC / HPF 0-5 0 - 5 RBC/hpf   WBC, UA 0-5 0 - 5 WBC/hpf   Bacteria, UA NONE SEEN NONE SEEN   Squamous Epithelial / LPF 0-5 (A) NONE SEEN  CBC     Status: Abnormal   Collection Time: 02/27/17  4:43 AM  Result Value Ref Range   WBC 7.0 4.0 - 10.5 K/uL   RBC 3.56 (L) 3.87 - 5.11 MIL/uL   Hemoglobin 11.6 (L) 12.0 - 15.0 g/dL   HCT 33.5 (L) 36.0 - 46.0 %   MCV 94.1 78.0 - 100.0 fL   MCH 32.6 26.0 - 34.0 pg   MCHC 34.6 30.0 - 36.0 g/dL   RDW 13.7 11.5 - 15.5 %   Platelets 436 (H) 150 - 400 K/uL  Basic metabolic panel     Status: Abnormal   Collection Time: 02/27/17  4:43 AM  Result Value Ref Range   Sodium 132 (L) 135 - 145 mmol/L   Potassium 4.0 3.5 - 5.1 mmol/L   Chloride 104 101 - 111 mmol/L   CO2 22 22 - 32 mmol/L   Glucose, Bld 111 (H) 65 - 99 mg/dL   BUN 15 6 - 20 mg/dL   Creatinine, Ser 0.68 0.44 - 1.00 mg/dL   Calcium 8.5 (L) 8.9 - 10.3 mg/dL   GFR calc non Af Amer >60 >60 mL/min   GFR calc Af Amer >60 >60 mL/min    Comment: (NOTE) The eGFR has been calculated using the CKD EPI equation. This calculation has not been validated in all clinical situations. eGFR's persistently <60 mL/min signify possible Chronic Kidney Disease.    Anion gap 6 5 - 15    Ct Abdomen Pelvis W  Contrast  Addendum Date: 02/26/2017   ADDENDUM REPORT: 02/26/2017 12:08 ADDENDUM: Multiple attempts were made to contact Dr. Oletta Lamas, the doctor on-call for Dr. Amedeo Plenty, but were unsuccessful. Attempts were also made to contact Dr. Amedeo Plenty, unsuccessfully. Therefore, I called the patient myself at 11:20 a.m. on 02/26/2017, and instructed her to go to the emergency room for further treatment and evaluation. She wanted to go to [facility], therefore I called and spoke to Dr. Zenia Resides regarding the patient. After speaking with Dr. Zenia Resides, Dr. Oletta Lamas called back, and I also gave him the results at 11:30 a.m. Electronically Signed   By: Rolm Baptise M.D.   On: 02/26/2017 12:08   Result Date: 02/26/2017 CLINICAL DATA:  Bloating, abdominal distention. EXAM: CT ABDOMEN AND PELVIS WITH CONTRAST TECHNIQUE: Multidetector CT imaging of the abdomen and pelvis was performed using the standard protocol following bolus administration of intravenous contrast. CONTRAST:  80 cc Isovue 300 IV COMPARISON:  03/28/2012 FINDINGS: Lower chest: Scarring in the lung bases. Suspect COPD. No acute findings. Hepatobiliary: Numerous scattered low-density lesions in the liver, most compatible with small cysts. Pancreas: No focal abnormality or ductal dilatation. Spleen: No focal abnormality.  Normal size. Adrenals/Urinary Tract: No adrenal abnormality. No focal renal abnormality. No stones or hydronephrosis. Urinary bladder is unremarkable. Stomach/Bowel: Large amount of stool noted throughout the colon. Stomach is decompressed as is the small bowel, grossly unremarkable. Vascular/Lymphatic: Scattered aortic and iliac calcifications. No aneurysm or adenopathy. Reproductive: Prior hysterectomy.  No adnexal masses. Other: Large amount of free air in the abdomen and pelvis layering anteriorly. Exact source is not identified. No definite inflammatory process or apple normal bowel wall noted. Free fluid noted in the pelvis and adjacent to the  liver and spleen. Musculoskeletal: No acute bony abnormality. Postoperative changes from posterior fusion in the lumbosacral spine. IMPRESSION: Large amount of pneumoperitoneum in the abdomen or pelvis compatible with bowel perforation. Exact source is not identified. No abnormal bowel wall area noted. There is a large amount stool within the colon. Small to mild free fluid in the abdomen and pelvis. Multiple hepatic cysts. Attempts are being made to contact Dr. Amedeo Plenty at this time. Electronically Signed: By: Rolm Baptise M.D. On: 02/26/2017 09:58               Blood pressure 113/65, pulse 68, temperature 97.6 F (36.4 C), temperature source Oral, resp. rate 18, height 5' (1.524 m), weight 40.3 kg (88 lb 12.8 oz), SpO2 98 %.  Physical exam:   General--thin white female who is awake and in no distress ENT--nonicteric Neck--supple with no lymphadenopathy Heart--regular rate and rhythm without murmurs gallops Lungs--clear Abdomen--distended but completely soft with a few bowel sounds. Minimal nonlocalizing tenderness Psych--alert and oriented, appropriate   Assessment: 1. Pneumoperitoneum. The patient clinically does not appear to have peritonitis and states this morning that she is relatively asymptomatic. This is really quite confusing and is probably due to worsening of her underlying problem. 2. History of bloating/small bowel diverticulitis. This is been a long-standing problem causing chronic symptoms and has been worked up at other institutions. I think the underlying cause of all these is unclear. The CT scan does not clearly show small bowel diverticulitis.  Plan: Agree with following her clinically. She does not currently have a surgical abdomen. At some point she improves we may consider some small bowel enteroclysis. Will follow   Kymir Coles JR,Allisson Schindel L 02/27/2017, 6:36 AM   This note was created using voice recognition software and minor errors may Have occurred  unintentionally. Pager: [phone] If no answer or after hours call [phone]

## 2017-02-27 NOTE — Progress Notes (Signed)
Peritoneal free air  Subjective: No new complaints.  No pain  Objective: Vital signs in last 24 hours: Temp:  [97.6 F (36.4 C)-98 F (36.7 C)] 97.6 F (36.4 C) (03/31 0541) Pulse Rate:  [50-86] 68 (03/31 0541) Resp:  [17-18] 18 (03/31 0541) BP: (101-142)/(58-78) 113/65 (03/31 0541) SpO2:  [96 %-100 %] 98 % (03/31 0541) Weight:  [40.3 kg (88 lb 12.8 oz)] 40.3 kg (88 lb 12.8 oz) (03/30 1638) Last BM Date: 02/25/17  Intake/Output from previous day: 03/30 0701 - 03/31 0700 In: 1198.8 [I.V.:1048.8; IV Piggyback:150] Out: 5427 [Urine:1550] Intake/Output this shift: No intake/output data recorded.  General appearance: alert and cooperative GI: soft, non-tender; bowel sounds normal; no masses,  no organomegaly  Lab Results:  Results for orders placed or performed during the hospital encounter of 02/26/17 (from the past 24 hour(s))  Lipase, blood     Status: None   Collection Time: 02/26/17  1:14 PM  Result Value Ref Range   Lipase 21 11 - 51 U/L  Comprehensive metabolic panel     Status: Abnormal   Collection Time: 02/26/17  1:14 PM  Result Value Ref Range   Sodium 132 (L) 135 - 145 mmol/L   Potassium 3.7 3.5 - 5.1 mmol/L   Chloride 101 101 - 111 mmol/L   CO2 23 22 - 32 mmol/L   Glucose, Bld 94 65 - 99 mg/dL   BUN 19 6 - 20 mg/dL   Creatinine, Ser 0.62 0.44 - 1.00 mg/dL   Calcium 9.5 8.9 - 10.3 mg/dL   Total Protein 7.4 6.5 - 8.1 g/dL   Albumin 4.4 3.5 - 5.0 g/dL   AST 29 15 - 41 U/L   ALT 20 14 - 54 U/L   Alkaline Phosphatase 50 38 - 126 U/L   Total Bilirubin 0.6 0.3 - 1.2 mg/dL   GFR calc non Af Amer >60 >60 mL/min   GFR calc Af Amer >60 >60 mL/min   Anion gap 8 5 - 15  CBC     Status: Abnormal   Collection Time: 02/26/17  1:14 PM  Result Value Ref Range   WBC 10.2 4.0 - 10.5 K/uL   RBC 4.02 3.87 - 5.11 MIL/uL   Hemoglobin 13.0 12.0 - 15.0 g/dL   HCT 37.2 36.0 - 46.0 %   MCV 92.5 78.0 - 100.0 fL   MCH 32.3 26.0 - 34.0 pg   MCHC 34.9 30.0 - 36.0 g/dL   RDW  13.4 11.5 - 15.5 %   Platelets 506 (H) 150 - 400 K/uL  Urinalysis, Routine w reflex microscopic     Status: Abnormal   Collection Time: 02/26/17  2:50 PM  Result Value Ref Range   Color, Urine STRAW (A) YELLOW   APPearance CLEAR CLEAR   Specific Gravity, Urine 1.020 1.005 - 1.030   pH 6.0 5.0 - 8.0   Glucose, UA NEGATIVE NEGATIVE mg/dL   Hgb urine dipstick NEGATIVE NEGATIVE   Bilirubin Urine NEGATIVE NEGATIVE   Ketones, ur NEGATIVE NEGATIVE mg/dL   Protein, ur NEGATIVE NEGATIVE mg/dL   Nitrite NEGATIVE NEGATIVE   Leukocytes, UA SMALL (A) NEGATIVE   RBC / HPF 0-5 0 - 5 RBC/hpf   WBC, UA 0-5 0 - 5 WBC/hpf   Bacteria, UA NONE SEEN NONE SEEN   Squamous Epithelial / LPF 0-5 (A) NONE SEEN  CBC     Status: Abnormal   Collection Time: 02/27/17  4:43 AM  Result Value Ref Range   WBC 7.0 4.0 -  10.5 K/uL   RBC 3.56 (L) 3.87 - 5.11 MIL/uL   Hemoglobin 11.6 (L) 12.0 - 15.0 g/dL   HCT 33.5 (L) 36.0 - 46.0 %   MCV 94.1 78.0 - 100.0 fL   MCH 32.6 26.0 - 34.0 pg   MCHC 34.6 30.0 - 36.0 g/dL   RDW 13.7 11.5 - 15.5 %   Platelets 436 (H) 150 - 400 K/uL  Basic metabolic panel     Status: Abnormal   Collection Time: 02/27/17  4:43 AM  Result Value Ref Range   Sodium 132 (L) 135 - 145 mmol/L   Potassium 4.0 3.5 - 5.1 mmol/L   Chloride 104 101 - 111 mmol/L   CO2 22 22 - 32 mmol/L   Glucose, Bld 111 (H) 65 - 99 mg/dL   BUN 15 6 - 20 mg/dL   Creatinine, Ser 0.68 0.44 - 1.00 mg/dL   Calcium 8.5 (L) 8.9 - 10.3 mg/dL   GFR calc non Af Amer >60 >60 mL/min   GFR calc Af Amer >60 >60 mL/min   Anion gap 6 5 - 15     Studies/Results Radiology     MEDS, Scheduled . acidophilus  1 capsule Oral Daily  . enoxaparin (LOVENOX) injection  30 mg Subcutaneous Q24H  . levothyroxine  75 mcg Oral QAC breakfast  . pantoprazole  40 mg Oral Daily  . piperacillin-tazobactam (ZOSYN)  IV  3.375 g Intravenous Q8H  . polyethylene glycol  34 g Oral BID     Assessment: Peritoneal free air CT shows no  contrast extravagation.  WBC normal.  No acute surgical indication but patient does have signs of a chronic partial SBO of the distal TI.    Plan: Will start a slow bowel prep and repeat AXR today to see if contrast has moved into the colon.   LOS: 1 day    Rosario Adie, [facility], [place]   02/27/2017 8:34 AM

## 2017-02-28 MED ORDER — CHLORHEXIDINE GLUCONATE 0.12 % MT SOLN
15.0000 mL | Freq: Two times a day (BID) | OROMUCOSAL | Status: DC
  Administered 2017-02-28 – 2017-03-08 (×14): 15 mL via OROMUCOSAL
  Filled 2017-02-28 (×16): qty 15

## 2017-02-28 MED ORDER — MINERAL OIL RE ENEM
1.0000 | ENEMA | Freq: Once | RECTAL | Status: AC | PRN
  Administered 2017-02-28: 1 via RECTAL
  Filled 2017-02-28 (×2): qty 1

## 2017-02-28 MED ORDER — MINERAL OIL PO OIL
TOPICAL_OIL | Freq: Once | ORAL | Status: AC
  Administered 2017-02-28: 10:00:00 via ORAL
  Filled 2017-02-28: qty 30

## 2017-02-28 MED ORDER — ORAL CARE MOUTH RINSE
15.0000 mL | Freq: Two times a day (BID) | OROMUCOSAL | Status: DC
  Administered 2017-02-28 – 2017-03-05 (×11): 15 mL via OROMUCOSAL

## 2017-02-28 NOTE — Progress Notes (Signed)
[facility] PROGRESS NOTE Subjective has had several good bowel movements and passing gas. Is noting some increase nonlocalizing abdominal pain.  Objective: Vital signs in last 24 hours: Temp:  [98 F (36.7 C)-98.4 F (36.9 C)] 98 F (36.7 C) (04/01 0535) Pulse Rate:  [73-90] 80 (04/01 0535) Resp:  [16-18] 16 (04/01 0535) BP: (117-132)/(64-73) 132/73 (04/01 0535) SpO2:  [97 %-99 %] 99 % (04/01 0535) Last BM Date: 02/28/17  Intake/Output from previous day: 03/31 0701 - 04/01 0700 In: 2550 [P.O.:600; I.V.:1800; IV Piggyback:150] Out: 2050 [Urine:2050] Intake/Output this shift: Total I/O In: -  Out: 400 [Urine:400]  PE: General-- ambulating in the room without difficulty  Lungs-- clear Abdomen-- distended with increased bowel sounds with mild nonlocalizing tenderness.  Lab Results:  Recent Labs  02/26/17 1314 02/27/17 0443  WBC 10.2 7.0  HGB 13.0 11.6*  HCT 37.2 33.5*  PLT 506* 436*   BMET  Recent Labs  02/26/17 1314 02/27/17 0443  NA 132* 132*  K 3.7 4.0  CL 101 104  CO2 23 22  CREATININE 0.62 0.68   LFT  Recent Labs  02/26/17 1314  PROT 7.4  AST 29  ALT 20  ALKPHOS 50  BILITOT 0.6   PT/INR No results for input(s): LABPROT, INR in the last 72 hours. PANCREAS  Recent Labs  02/26/17 1314  LIPASE 21         Studies/Results: Dg Abd Portable 1v  Result Date: 02/27/2017 CLINICAL DATA:  Constipation.  Rule out small bowel obstruction. EXAM: PORTABLE ABDOMEN - 1 VIEW COMPARISON:  CT scan from yesterday FINDINGS: The CT scan from yesterday demonstrated pneumoperitoneum. This is not well assessed on this portable supine film with colonic contrast. No convincing evidence of pneumoperitoneum on this study however. There is contrast throughout the colon. No evidence of bowel obstruction. The bones and soft tissues are otherwise unremarkable. IMPRESSION: The pneumoperitoneum present on the CT scan from yesterday is not well assessed on  this study and not definitely seen. No other acute abnormalities. Contrast now seen throughout the colon. Electronically Signed   By: Dorise Bullion III M.D   On: 02/27/2017 09:21    Medications: I have reviewed the patient's current medications.  Assessment/Plan: 1. Pneumoperitoneum/chronic bloating. This is really quite peculiar. There is no signs of extravasation of contrast from the initial CT scan. Agree with Dr. Marcello Moores that elective laparoscopy would be appropriate. Our service will continue to follow.   Newell Wafer JR,Talissa Apple L 02/28/2017, 10:16 AM  This note was created using voice recognition software. Minor errors may Have occurred unintentionally.  Pager: [phone] If no answer or after hours call [phone]

## 2017-02-28 NOTE — Progress Notes (Signed)
Brought pt SCD to her room to  apply them on pt's legs. Pt expressed reluctance to me applying them. Stated "how am I going to be able to get to the bathroom in time?  I will end up wetting the bed". I replied that she would call the staff using the call bell to help her take off her SCD's. Pt verbalized that she did not want to were them. I educated her about the need for them to prevent blood clots. She replied " I walk a lot, I don't need them." Per pt request, I left the SCD';s off.

## 2017-02-28 NOTE — Progress Notes (Signed)
Pt c/o of tenderness in her upper back,  just below her rib cage, on the left side. She said it had been bothering her since admission. She described it as "a nagging pain."  There was no obvious  physical cause for her pain. She asked me not to "do anything about tonight because she wanted to sleep".  Pt said she would rather  discuss it with MD in am. Will continue to monitor.

## 2017-02-28 NOTE — Progress Notes (Signed)
Peritoneal free air  Subjective: C/o pain in her left flank.  Objective: Vital signs in last 24 hours: Temp:  [98 F (36.7 C)-98.4 F (36.9 C)] 98 F (36.7 C) (04/01 0535) Pulse Rate:  [73-90] 80 (04/01 0535) Resp:  [16-18] 16 (04/01 0535) BP: (117-132)/(64-73) 132/73 (04/01 0535) SpO2:  [97 %-99 %] 99 % (04/01 0535) Last BM Date: 02/28/17  Intake/Output from previous day: 03/31 0701 - 04/01 0700 In: 2550 [P.O.:600; I.V.:1800; IV Piggyback:150] Out: 2050 [Urine:2050] Intake/Output this shift: Total I/O In: -  Out: 400 [Urine:400]  General appearance: alert and cooperative GI: soft, non-tender; bowel sounds normal; no masses,  no organomegaly  Lab Results:  No results found for this or any previous visit (from the past 24 hour(s)).   Studies/Results Radiology     MEDS, Scheduled . acidophilus  1 capsule Oral Daily  . chlorhexidine  15 mL Mouth Rinse BID  . enoxaparin (LOVENOX) injection  30 mg Subcutaneous Q24H  . levothyroxine  75 mcg Oral QAC breakfast  . mouth rinse  15 mL Mouth Rinse q12n4p  . mineral oil   Oral Once  . pantoprazole  40 mg Oral Daily  . polyethylene glycol  34 g Oral BID     Assessment: Peritoneal free air CT shows no contrast extravagation.  WBC normal.  No acute surgical indication but patient does have signs of a chronic partial SBO of the distal TI.   Repeat AXR shows contrast passing through the colon and still no extravagation.    Plan: Will cont a slow bowel prep with Miralax and mineral oil.  Plan for diagnostic laparoscopy after she has completed her prep.  Cont clears   LOS: 2 days    Rosario Adie, MD [facility], [place]   02/28/2017 8:24 AM

## 2017-03-01 LAB — CBC
HCT: 35.4 % — ABNORMAL LOW (ref 36.0–46.0)
Hemoglobin: 11.9 g/dL — ABNORMAL LOW (ref 12.0–15.0)
MCH: 31 pg (ref 26.0–34.0)
MCHC: 33.6 g/dL (ref 30.0–36.0)
MCV: 92.2 fL (ref 78.0–100.0)
Platelets: 464 10*3/uL — ABNORMAL HIGH (ref 150–400)
RBC: 3.84 MIL/uL — ABNORMAL LOW (ref 3.87–5.11)
RDW: 13.4 % (ref 11.5–15.5)
WBC: 5.9 10*3/uL (ref 4.0–10.5)

## 2017-03-01 NOTE — Progress Notes (Signed)
No complaints.  Plan as per surgery. To have diagnostic lap tomorrow.  Will sign off. Call if needed.

## 2017-03-01 NOTE — Progress Notes (Signed)
[facility] Progress Note     Subjective: CC abdominal bloating. Patient denies abdominal pain and tolerating clears without N/V. Reports large, liquid "blowout" BM this AM. Ambulating.   Diagnostic laparoscopy discussed with patient.   Objective: Vital signs in last 24 hours: Temp:  [97.7 F (36.5 C)-98.2 F (36.8 C)] 98.1 F (36.7 C) (04/02 0407) Pulse Rate:  [76-80] 77 (04/02 0407) Resp:  [16] 16 (04/02 0407) BP: (105-133)/(69-73) 133/71 (04/02 0407) SpO2:  [96 %-100 %] 99 % (04/02 0407) Last BM Date: 02/28/17  Intake/Output from previous day: 04/01 0701 - 04/02 0700 In: 2640 [P.O.:840; I.V.:1800] Out: 400 [Urine:400] Intake/Output this shift: No intake/output data recorded.  PE: Gen:  Alert, NAD, pleasant Card:  Regular rate and rhythm Pulm:  Non-labored, clear to auscultation bilaterally Abd: Soft, non-tender, moderate distention, bowel sounds present in all 4 quadrants, no hernias or masses. Ext:  No erythema, edema, or tenderness   Lab Results:   Recent Labs  02/27/17 0443 03/01/17 0522  WBC 7.0 5.9  HGB 11.6* 11.9*  HCT 33.5* 35.4*  PLT 436* 464*   BMET  Recent Labs  02/26/17 1314 02/27/17 0443  NA 132* 132*  K 3.7 4.0  CL 101 104  CO2 23 22  GLUCOSE 94 111*  BUN 19 15  CREATININE 0.62 0.68  CALCIUM 9.5 8.5*   PT/INR No results for input(s): LABPROT, INR in the last 72 hours. CMP     Component Value Date/Time   NA 132 (L) 02/27/2017 0443   K 4.0 02/27/2017 0443   CL 104 02/27/2017 0443   CO2 22 02/27/2017 0443   GLUCOSE 111 (H) 02/27/2017 0443   BUN 15 02/27/2017 0443   CREATININE 0.68 02/27/2017 0443   CALCIUM 8.5 (L) 02/27/2017 0443   PROT 7.4 02/26/2017 1314   ALBUMIN 4.4 02/26/2017 1314   AST 29 02/26/2017 1314   ALT 20 02/26/2017 1314   ALKPHOS 50 02/26/2017 1314   BILITOT 0.6 02/26/2017 1314   GFRNONAA >60 02/27/2017 0443   GFRAA >60 02/27/2017 0443   Lipase     Component Value Date/Time   LIPASE 21  02/26/2017 1314       Studies/Results: Dg Abd Portable 1v  Result Date: 02/27/2017 CLINICAL DATA:  Constipation.  Rule out small bowel obstruction. EXAM: PORTABLE ABDOMEN - 1 VIEW COMPARISON:  CT scan from yesterday FINDINGS: The CT scan from yesterday demonstrated pneumoperitoneum. This is not well assessed on this portable supine film with colonic contrast. No convincing evidence of pneumoperitoneum on this study however. There is contrast throughout the colon. No evidence of bowel obstruction. The bones and soft tissues are otherwise unremarkable. IMPRESSION: The pneumoperitoneum present on the CT scan from yesterday is not well assessed on this study and not definitely seen. No other acute abnormalities. Contrast now seen throughout the colon. Electronically Signed   By: Dorise Bullion III M.D   On: 02/27/2017 09:21    Anti-infectives: Anti-infectives    Start     Dose/Rate Route Frequency Ordered Stop   02/26/17 1600  piperacillin-tazobactam (ZOSYN) IVPB 3.375 g  Status:  Discontinued     3.375 g 12.5 mL/hr over 240 Minutes Intravenous Every 8 hours 02/26/17 1524 02/28/17 0824   02/26/17 1330  piperacillin-tazobactam (ZOSYN) IVPB 3.375 g     3.375 g 100 mL/hr over 30 Minutes Intravenous  Once 02/26/17 1326 02/26/17 1408       Assessment/Plan Peritoneal free air, chronic  CT shows no contrast extravagation.  WBC normal.  No acute surgical indication but patient does have signs of a chronic partial SBO of the distal TI.   Repeat Abd film shows contrast passing through the colon and still no extravagation.   Plan: continue slow bowel prep with clear liquids, mineral oil, miralax and plan for diagnostic laparoscopy later this week.  Will discuss timing of surgery with MD - hopefully tomorrow or Wednesday, as patient had large volume BM today.    LOS: 3 days    [facility] 03/01/2017, 7:45 AM Pager: [phone] Consults:  [phone] Mon-Fri 7:00 am-4:30 pm Sat-Sun 7:00 am-11:30 am

## 2017-03-01 NOTE — Progress Notes (Signed)
Administered mineral oil enema to pt per her request. Some of the oil leaked out of her rectum. Pt replied that she has a weak rectal tone. No result at this time. Will continue to monitor.

## 2017-03-01 NOTE — Progress Notes (Signed)
Pt reported she had a small stool as a result of the mineral oil enema. Will continue to monitor.

## 2017-03-02 LAB — BASIC METABOLIC PANEL
Anion gap: 5 (ref 5–15)
BUN: <5 mg/dL — ABNORMAL LOW (ref 6–20)
CO2: 25 mmol/L (ref 22–32)
Calcium: 9 mg/dL (ref 8.9–10.3)
Chloride: 104 mmol/L (ref 101–111)
Creatinine, Ser: 0.63 mg/dL (ref 0.44–1.00)
GFR calc Af Amer: >60 mL/min (ref >60)
GFR calc non Af Amer: >60 mL/min (ref >60)
Glucose, Bld: 106 mg/dL — ABNORMAL HIGH (ref 65–99)
Potassium: 4.1 mmol/L (ref 3.5–5.1)
Sodium: 134 mmol/L — ABNORMAL LOW (ref 135–145)

## 2017-03-02 LAB — SURGICAL PCR SCREEN
MRSA, PCR: NEGATIVE
Staphylococcus aureus: NEGATIVE

## 2017-03-02 MED ORDER — BOOST / RESOURCE BREEZE PO LIQD
1.0000 | Freq: Three times a day (TID) | ORAL | Status: DC
  Administered 2017-03-02 – 2017-03-07 (×17): 1 via ORAL

## 2017-03-02 NOTE — Progress Notes (Signed)
[facility] Progress Note     Subjective: CC watery rectal leakage and generalized fatigue. She has a known Hx poor rectal tone with rectal leakage feels it is exacerbated by clear liquid diet. She denies fever, chills, nausea, or vomiting. She still endorses abdominal discomfort/fullness but denies pain.   Objective: Vital signs in last 24 hours: Temp:  [97.5 F (36.4 C)-98.9 F (37.2 C)] 98.1 F (36.7 C) (04/03 0517) Pulse Rate:  [73-89] 89 (04/03 0517) Resp:  [16-17] 16 (04/03 0517) BP: (127-144)/(65-76) 127/73 (04/03 0517) SpO2:  [97 %-99 %] 98 % (04/03 0517) Last BM Date: 03/01/17  Intake/Output from previous day: 04/02 0701 - 04/03 0700 In: 2835 [P.O.:960; I.V.:1875] Out: 0  Intake/Output this shift: No intake/output data recorded.  PE: Gen:  Alert, NAD, pleasant Card:  Regular rate and rhythm Pulm:  Non-labored, clear to auscultation bilaterally Abd: Soft, non-tender, moderate distention, bowel sounds present in all 4 quadrants, no hernias or masses. Ext:  No erythema, edema, or tenderness   Lab Results:   Recent Labs  03/01/17 0522  WBC 5.9  HGB 11.9*  HCT 35.4*  PLT 464*   BMET  Recent Labs  03/02/17 0820  NA 134*  K 4.1  CL 104  CO2 25  GLUCOSE 106*  BUN <5*  CREATININE 0.63  CALCIUM 9.0   PT/INR No results for input(s): LABPROT, INR in the last 72 hours. CMP     Component Value Date/Time   NA 134 (L) 03/02/2017 0820   K 4.1 03/02/2017 0820   CL 104 03/02/2017 0820   CO2 25 03/02/2017 0820   GLUCOSE 106 (H) 03/02/2017 0820   BUN <5 (L) 03/02/2017 0820   CREATININE 0.63 03/02/2017 0820   CALCIUM 9.0 03/02/2017 0820   PROT 7.4 02/26/2017 1314   ALBUMIN 4.4 02/26/2017 1314   AST 29 02/26/2017 1314   ALT 20 02/26/2017 1314   ALKPHOS 50 02/26/2017 1314   BILITOT 0.6 02/26/2017 1314   GFRNONAA >60 03/02/2017 0820   GFRAA >60 03/02/2017 0820   Lipase     Component Value Date/Time   LIPASE 21 02/26/2017 1314        Studies/Results: No results found.  Anti-infectives: Anti-infectives    Start     Dose/Rate Route Frequency Ordered Stop   02/26/17 1600  piperacillin-tazobactam (ZOSYN) IVPB 3.375 g  Status:  Discontinued     3.375 g 12.5 mL/hr over 240 Minutes Intravenous Every 8 hours 02/26/17 1524 02/28/17 0824   02/26/17 1330  piperacillin-tazobactam (ZOSYN) IVPB 3.375 g     3.375 g 100 mL/hr over 30 Minutes Intravenous  Once 02/26/17 1326 02/26/17 1408       Assessment/Plan Peritoneal free air, chronic  CT shows no contrast extravagation. WBC normal. No acute surgical indication but patient does have signs of a chronic partial SBO of the distal TI. Repeat Abd film shows contrast passing through the colon and still no extravagation.   Hyponatremia - mild, asymptomatic. 134. Follow.  Plan: continue clears + Boost Breeze. D/c miralax. NPO after MN. Diagnostic laparoscopy tomorrow.  Consent for surgery ordered.     LOS: 4 days    [facility] 03/02/2017, 9:11 AM Pager: [phone] Consults: [phone] Mon-Fri 7:00 am-4:30 pm Sat-Sun 7:00 am-11:30 am

## 2017-03-03 MED ORDER — MORPHINE SULFATE (PF) 2 MG/ML IV SOLN
2.0000 mg | INTRAVENOUS | Status: DC | PRN
  Administered 2017-03-04 – 2017-03-06 (×7): 2 mg via INTRAVENOUS
  Filled 2017-03-03 (×8): qty 1

## 2017-03-03 NOTE — Progress Notes (Signed)
[facility] Progress Note  Day of Surgery  Subjective: Cc eager to have surgery. No other changes.   Objective: Vital signs in last 24 hours: Temp:  [97.6 F (36.4 C)-98.7 F (37.1 C)] 97.6 F (36.4 C) (04/04 0501) Pulse Rate:  [77-82] 77 (04/04 0501) Resp:  [15-16] 16 (04/04 0501) BP: (99-120)/(45-66) 99/45 (04/04 0501) SpO2:  [98 %-100 %] 99 % (04/04 0501) Last BM Date: 03/02/17  Intake/Output from previous day: 04/03 0701 - 04/04 0700 In: 1772.5 [P.O.:320; I.V.:1452.5] Out: 1650 [Urine:1650] Intake/Output this shift: Total I/O In: -  Out: 300 [Urine:300]  PE: Gen: Alert, NAD, pleasant Card: Regular rate and rhythm Pulm: Non-labored, clear to auscultation bilaterally Abd: Soft, non-tender, moderate distention, bowel sounds present in all 4 quadrants, no hernias or masses. Ext: No erythema, edema, or tenderness   Lab Results:   Recent Labs  03/01/17 0522  WBC 5.9  HGB 11.9*  HCT 35.4*  PLT 464*   BMET  Recent Labs  03/02/17 0820  NA 134*  K 4.1  CL 104  CO2 25  GLUCOSE 106*  BUN <5*  CREATININE 0.63  CALCIUM 9.0   PT/INR No results for input(s): LABPROT, INR in the last 72 hours. CMP     Component Value Date/Time   NA 134 (L) 03/02/2017 0820   K 4.1 03/02/2017 0820   CL 104 03/02/2017 0820   CO2 25 03/02/2017 0820   GLUCOSE 106 (H) 03/02/2017 0820   BUN <5 (L) 03/02/2017 0820   CREATININE 0.63 03/02/2017 0820   CALCIUM 9.0 03/02/2017 0820   PROT 7.4 02/26/2017 1314   ALBUMIN 4.4 02/26/2017 1314   AST 29 02/26/2017 1314   ALT 20 02/26/2017 1314   ALKPHOS 50 02/26/2017 1314   BILITOT 0.6 02/26/2017 1314   GFRNONAA >60 03/02/2017 0820   GFRAA >60 03/02/2017 0820   Lipase     Component Value Date/Time   LIPASE 21 02/26/2017 1314       Studies/Results: No results found.  Anti-infectives: Anti-infectives    Start     Dose/Rate Route Frequency Ordered Stop   02/26/17 1600  piperacillin-tazobactam (ZOSYN) IVPB  3.375 g  Status:  Discontinued     3.375 g 12.5 mL/hr over 240 Minutes Intravenous Every 8 hours 02/26/17 1524 02/28/17 0824   02/26/17 1330  piperacillin-tazobactam (ZOSYN) IVPB 3.375 g     3.375 g 100 mL/hr over 30 Minutes Intravenous  Once 02/26/17 1326 02/26/17 1408     Assessment/Plan Peritoneal free air, chronic  CT shows no contrast extravagation. WBC normal. No acute surgical indication but patient does have signs of a chronic partial SBO of the distal TI. Repeat Abd filmshows contrast passing through the colon and still no extravagation.   Hyponatremia - mild, asymptomatic. 134. Follow.  FEN - NPO, IVF ID: none VTE: SCD's   Plan: clear liquids, NPO after MN for Dx laparoscopy    LOS: 5 days    Jill Alexanders , [facility] 03/03/2017, 10:28 AM Pager: [phone] Consults: [phone] Mon-Fri 7:00 am-4:30 pm Sat-Sun 7:00 am-11:30 am

## 2017-03-03 NOTE — Care Management Important Message (Signed)
Important Message  Patient Details  Name: Sara Davila MRN: [id number] Date of Birth: 1930-02-27   Medicare Important Message Given:  Yes    Kerin Salen 03/03/2017, 10:51 AMImportant Message  Patient Details  Name: Sara Davila MRN: [id number] Date of Birth: May 07, 1930   Medicare Important Message Given:  Yes    Kerin Salen 03/03/2017, 10:51 AM

## 2017-03-04 ENCOUNTER — Encounter (HOSPITAL_COMMUNITY)
Admission: EM | Disposition: A | Discharge status: Nursing Home (Includes ICF) | Payer: Self-pay | Source: Home / Self Care

## 2017-03-04 ENCOUNTER — Inpatient Hospital Stay (HOSPITAL_COMMUNITY): Payer: Medicare Other | Admitting: Anesthesiology

## 2017-03-04 ENCOUNTER — Encounter (HOSPITAL_COMMUNITY): Payer: Self-pay | Admitting: Surgery

## 2017-03-04 HISTORY — PX: LAPAROSCOPY: SHX197

## 2017-03-04 HISTORY — PX: LAPAROSCOPIC APPENDECTOMY: SHX408

## 2017-03-04 LAB — CBC
HCT: 35.7 % — ABNORMAL LOW (ref 36.0–46.0)
Hemoglobin: 12 g/dL (ref 12.0–15.0)
MCH: 30.9 pg (ref 26.0–34.0)
MCHC: 33.6 g/dL (ref 30.0–36.0)
MCV: 92 fL (ref 78.0–100.0)
Platelets: 474 10*3/uL — ABNORMAL HIGH (ref 150–400)
RBC: 3.88 MIL/uL (ref 3.87–5.11)
RDW: 13.2 % (ref 11.5–15.5)
WBC: 14.1 10*3/uL — ABNORMAL HIGH (ref 4.0–10.5)

## 2017-03-04 LAB — CREATININE, SERUM
Creatinine, Ser: 0.7 mg/dL (ref 0.44–1.00)
GFR calc Af Amer: >60 mL/min (ref >60)
GFR calc non Af Amer: >60 mL/min (ref >60)

## 2017-03-04 SURGERY — LAPAROSCOPY, DIAGNOSTIC
Anesthesia: General

## 2017-03-04 MED ORDER — PANTOPRAZOLE SODIUM 40 MG IV SOLR
40.0000 mg | Freq: Every day | INTRAVENOUS | Status: DC
  Administered 2017-03-04: 40 mg via INTRAVENOUS
  Filled 2017-03-04: qty 40

## 2017-03-04 MED ORDER — ONDANSETRON 4 MG PO TBDP: 4.0000 mg | ORAL_TABLET | Freq: Four times a day (QID) | ORAL | Status: DC | PRN

## 2017-03-04 MED ORDER — FENTANYL CITRATE (PF) 100 MCG/2ML IJ SOLN
INTRAMUSCULAR | Status: AC
  Administered 2017-03-04: 25 ug via INTRAVENOUS
  Filled 2017-03-04: qty 2

## 2017-03-04 MED ORDER — FENTANYL CITRATE (PF) 250 MCG/5ML IJ SOLN
INTRAMUSCULAR | Status: AC
  Filled 2017-03-04: qty 5

## 2017-03-04 MED ORDER — FENTANYL CITRATE (PF) 100 MCG/2ML IJ SOLN
INTRAMUSCULAR | Status: DC | PRN
  Administered 2017-03-04: 25 ug via INTRAVENOUS
  Administered 2017-03-04 (×3): 50 ug via INTRAVENOUS
  Administered 2017-03-04: 25 ug via INTRAVENOUS
  Administered 2017-03-04: 50 ug via INTRAVENOUS

## 2017-03-04 MED ORDER — ROCURONIUM BROMIDE 50 MG/5ML IV SOSY
PREFILLED_SYRINGE | INTRAVENOUS | Status: AC
  Filled 2017-03-04: qty 5

## 2017-03-04 MED ORDER — BUPIVACAINE LIPOSOME 1.3 % IJ SUSP
20.0000 mL | Freq: Once | INTRAMUSCULAR | Status: AC
  Administered 2017-03-04: 20 mL
  Filled 2017-03-04: qty 20

## 2017-03-04 MED ORDER — ONDANSETRON HCL 4 MG/2ML IJ SOLN: 4.0000 mg | Freq: Four times a day (QID) | INTRAMUSCULAR | Status: DC | PRN

## 2017-03-04 MED ORDER — BUPIVACAINE HCL (PF) 0.25 % IJ SOLN
INTRAMUSCULAR | Status: AC
  Filled 2017-03-04: qty 30

## 2017-03-04 MED ORDER — LIDOCAINE 2% (20 MG/ML) 5 ML SYRINGE
INTRAMUSCULAR | Status: AC
  Filled 2017-03-04: qty 5

## 2017-03-04 MED ORDER — KCL IN DEXTROSE-NACL 20-5-0.45 MEQ/L-%-% IV SOLN
INTRAVENOUS | Status: DC
Start: 2017-03-04 — End: 2017-03-07
  Administered 2017-03-04 – 2017-03-06 (×5): via INTRAVENOUS
  Filled 2017-03-04 (×6): qty 1000

## 2017-03-04 MED ORDER — PHENYLEPHRINE 40 MCG/ML (10ML) SYRINGE FOR IV PUSH (FOR BLOOD PRESSURE SUPPORT)
PREFILLED_SYRINGE | INTRAVENOUS | Status: AC
  Filled 2017-03-04: qty 10

## 2017-03-04 MED ORDER — SUCCINYLCHOLINE CHLORIDE 200 MG/10ML IV SOSY
PREFILLED_SYRINGE | INTRAVENOUS | Status: DC | PRN
  Administered 2017-03-04: 100 mg via INTRAVENOUS

## 2017-03-04 MED ORDER — PROPOFOL 10 MG/ML IV BOLUS
INTRAVENOUS | Status: DC | PRN
  Administered 2017-03-04: 100 mg via INTRAVENOUS

## 2017-03-04 MED ORDER — CEFOTETAN DISODIUM-DEXTROSE 2-2.08 GM-% IV SOLR
INTRAVENOUS | Status: AC
  Filled 2017-03-04: qty 50

## 2017-03-04 MED ORDER — DEXAMETHASONE SODIUM PHOSPHATE 10 MG/ML IJ SOLN
INTRAMUSCULAR | Status: AC
  Filled 2017-03-04: qty 1

## 2017-03-04 MED ORDER — LIDOCAINE 2% (20 MG/ML) 5 ML SYRINGE
INTRAMUSCULAR | Status: DC | PRN
  Administered 2017-03-04: 60 mg via INTRAVENOUS

## 2017-03-04 MED ORDER — HEPARIN SODIUM (PORCINE) 5000 UNIT/ML IJ SOLN
5000.0000 [IU] | Freq: Three times a day (TID) | INTRAMUSCULAR | Status: DC
  Administered 2017-03-04 – 2017-03-08 (×11): 5000 [IU] via SUBCUTANEOUS
  Filled 2017-03-04 (×10): qty 1

## 2017-03-04 MED ORDER — DEXTROSE 5 % IV SOLN
INTRAVENOUS | Status: DC | PRN
  Administered 2017-03-04: 2 g via INTRAVENOUS

## 2017-03-04 MED ORDER — SUGAMMADEX SODIUM 200 MG/2ML IV SOLN
INTRAVENOUS | Status: DC | PRN
Start: 2017-03-04 — End: 2017-03-04
  Administered 2017-03-04: 100 mg via INTRAVENOUS

## 2017-03-04 MED ORDER — SUGAMMADEX SODIUM 200 MG/2ML IV SOLN
INTRAVENOUS | Status: AC
  Filled 2017-03-04: qty 2

## 2017-03-04 MED ORDER — PROPOFOL 10 MG/ML IV BOLUS
INTRAVENOUS | Status: AC
  Filled 2017-03-04: qty 20

## 2017-03-04 MED ORDER — LACTATED RINGERS IR SOLN
Status: DC | PRN
  Administered 2017-03-04: 200 mL

## 2017-03-04 MED ORDER — LACTATED RINGERS IV SOLN
INTRAVENOUS | Status: DC | PRN
  Administered 2017-03-04: 07:00:00 via INTRAVENOUS

## 2017-03-04 MED ORDER — FENTANYL CITRATE (PF) 100 MCG/2ML IJ SOLN
25.0000 ug | INTRAMUSCULAR | Status: DC | PRN
  Administered 2017-03-04: 50 ug via INTRAVENOUS
  Administered 2017-03-04 (×2): 25 ug via INTRAVENOUS

## 2017-03-04 MED ORDER — DEXAMETHASONE SODIUM PHOSPHATE 10 MG/ML IJ SOLN
INTRAMUSCULAR | Status: DC | PRN
  Administered 2017-03-04: 10 mg via INTRAVENOUS

## 2017-03-04 MED ORDER — ONDANSETRON HCL 4 MG/2ML IJ SOLN
INTRAMUSCULAR | Status: DC | PRN
  Administered 2017-03-04: 4 mg via INTRAVENOUS

## 2017-03-04 MED ORDER — PHENYLEPHRINE HCL 10 MG/ML IJ SOLN
INTRAMUSCULAR | Status: DC | PRN
  Administered 2017-03-04 (×2): 80 ug via INTRAVENOUS

## 2017-03-04 MED ORDER — CEFOTETAN DISODIUM 1 G IJ SOLR
1.0000 g | Freq: Two times a day (BID) | INTRAMUSCULAR | Status: AC
  Administered 2017-03-04: 1 g via INTRAVENOUS
  Filled 2017-03-04: qty 1

## 2017-03-04 MED ORDER — ROCURONIUM BROMIDE 50 MG/5ML IV SOSY
PREFILLED_SYRINGE | INTRAVENOUS | Status: DC | PRN
  Administered 2017-03-04: 40 mg via INTRAVENOUS

## 2017-03-04 MED ORDER — SUCCINYLCHOLINE CHLORIDE 200 MG/10ML IV SOSY
PREFILLED_SYRINGE | INTRAVENOUS | Status: AC
  Filled 2017-03-04: qty 10

## 2017-03-04 MED ORDER — LACTATED RINGERS IV SOLN: INTRAVENOUS | Status: DC

## 2017-03-04 MED ORDER — ONDANSETRON HCL 4 MG/2ML IJ SOLN
INTRAMUSCULAR | Status: AC
  Filled 2017-03-04: qty 2

## 2017-03-04 SURGICAL SUPPLY — 76 items
ADH SKN CLS APL DERMABOND .7 (GAUZE/BANDAGES/DRESSINGS) ×1
APPLICATOR COTTON TIP 6IN STRL (MISCELLANEOUS) ×3 IMPLANT
BAG SPEC RTRVL LRG 6X4 10 (ENDOMECHANICALS) ×1
BLADE EXTENDED COATED 6.5IN (ELECTRODE) IMPLANT
BLADE HEX COATED 2.75 (ELECTRODE) ×1 IMPLANT
CABLE HIGH FREQUENCY MONO STRZ (ELECTRODE) ×3 IMPLANT
CHLORAPREP W/TINT 26ML (MISCELLANEOUS) ×2 IMPLANT
COUNTER NEEDLE 20 DBL MAG RED (NEEDLE) ×1 IMPLANT
COVER MAYO STAND STRL (DRAPES) ×9 IMPLANT
COVER SURGICAL LIGHT HANDLE (MISCELLANEOUS) ×4 IMPLANT
CUTTER FLEX LINEAR 45M (STAPLE) ×2 IMPLANT
DECANTER SPIKE VIAL GLASS SM (MISCELLANEOUS) IMPLANT
DERMABOND ADVANCED (GAUZE/BANDAGES/DRESSINGS) ×2
DERMABOND ADVANCED .7 DNX12 (GAUZE/BANDAGES/DRESSINGS) IMPLANT
DRAIN CHANNEL 19F RND (DRAIN) IMPLANT
DRAPE LAPAROSCOPIC ABDOMINAL (DRAPES) ×5 IMPLANT
DRAPE WARM FLUID 44X44 (DRAPE) ×1 IMPLANT
DRSG OPSITE POSTOP 4X10 (GAUZE/BANDAGES/DRESSINGS) IMPLANT
DRSG OPSITE POSTOP 4X6 (GAUZE/BANDAGES/DRESSINGS) IMPLANT
DRSG OPSITE POSTOP 4X8 (GAUZE/BANDAGES/DRESSINGS) IMPLANT
ELECT PENCIL ROCKER SW 15FT (MISCELLANEOUS) ×4 IMPLANT
ELECT REM PT RETURN 15FT ADLT (MISCELLANEOUS) ×3 IMPLANT
EVACUATOR SILICONE 100CC (DRAIN) IMPLANT
GAUZE SPONGE 4X4 12PLY STRL (GAUZE/BANDAGES/DRESSINGS) ×3 IMPLANT
GLOVE BIOGEL M 8.0 STRL (GLOVE) ×4 IMPLANT
GOWN SPEC L4 XLG W/TWL (GOWN DISPOSABLE) ×3 IMPLANT
GOWN STRL REUS W/TWL XL LVL3 (GOWN DISPOSABLE) ×12 IMPLANT
HANDLE STAPLE EGIA 4 XL (STAPLE) IMPLANT
HANDLE SUCTION POOLE (INSTRUMENTS) IMPLANT
IRRIG SUCT STRYKERFLOW 2 WTIP (MISCELLANEOUS) ×3
IRRIGATION SUCT STRKRFLW 2 WTP (MISCELLANEOUS) ×1 IMPLANT
KIT BASIN OR (CUSTOM PROCEDURE TRAY) ×3 IMPLANT
LEGGING LITHOTOMY PAIR STRL (DRAPES) IMPLANT
NS IRRIG 1000ML POUR BTL (IV SOLUTION) ×4 IMPLANT
PACK COLON (CUSTOM PROCEDURE TRAY) ×1 IMPLANT
PACK GENERAL/GYN (CUSTOM PROCEDURE TRAY) ×3 IMPLANT
POUCH SPECIMEN RETRIEVAL 10MM (ENDOMECHANICALS) ×2 IMPLANT
RELOAD 45 VASCULAR/THIN (ENDOMECHANICALS) ×3 IMPLANT
RELOAD STAPLE 45 2.5 WHT GRN (ENDOMECHANICALS) IMPLANT
RELOAD STAPLE 45 PURP MED/THCK (STAPLE) IMPLANT
RELOAD TRI 45 ART MED THCK BLK (STAPLE) IMPLANT
RELOAD TRI 45 ART MED THCK PUR (STAPLE) IMPLANT
RELOAD TRI 60 ART MED THCK BLK (STAPLE) IMPLANT
RELOAD TRI 60 ART MED THCK PUR (STAPLE) IMPLANT
SCISSORS LAP 5X45 EPIX DISP (ENDOMECHANICALS) ×3 IMPLANT
SEALER TISSUE X1 CVD JAW (INSTRUMENTS) IMPLANT
SHEARS HARMONIC ACE PLUS 45CM (MISCELLANEOUS) IMPLANT
SLEEVE ADV FIXATION 5X100MM (TROCAR) ×5 IMPLANT
SPONGE DRAIN TRACH 4X4 STRL 2S (GAUZE/BANDAGES/DRESSINGS) IMPLANT
SPONGE LAP 18X18 X RAY DECT (DISPOSABLE) ×6 IMPLANT
STAPLER VISISTAT 35W (STAPLE) ×3 IMPLANT
SUCTION POOLE HANDLE (INSTRUMENTS)
SUT ETHILON 3 0 PS 1 (SUTURE) IMPLANT
SUT MNCRL AB 4-0 PS2 18 (SUTURE) ×4 IMPLANT
SUT NOVA 1 T20/GS 25DT (SUTURE) IMPLANT
SUT PDS AB 1 CTX 36 (SUTURE) IMPLANT
SUT PDS AB 1 TP1 96 (SUTURE) IMPLANT
SUT SILK 2 0 (SUTURE) ×3
SUT SILK 2 0 SH CR/8 (SUTURE) ×3 IMPLANT
SUT SILK 2-0 18XBRD TIE 12 (SUTURE) ×1 IMPLANT
SUT SILK 3 0 (SUTURE) ×3
SUT SILK 3 0 SH CR/8 (SUTURE) ×3 IMPLANT
SUT SILK 3-0 18XBRD TIE 12 (SUTURE) ×1 IMPLANT
SUT VIC AB 3-0 SH 18 (SUTURE) IMPLANT
SUT VICRYL 0 UR6 27IN ABS (SUTURE) ×2 IMPLANT
SUT VICRYL 2 0 18  UND BR (SUTURE)
SUT VICRYL 2 0 18 UND BR (SUTURE) IMPLANT
SYR 10ML ECCENTRIC (SYRINGE) ×3 IMPLANT
TOWEL OR 17X26 10 PK STRL BLUE (TOWEL DISPOSABLE) ×3 IMPLANT
TOWEL OR NON WOVEN STRL DISP B (DISPOSABLE) ×3 IMPLANT
TRAY FOLEY W/METER SILVER 16FR (SET/KITS/TRAYS/PACK) ×3 IMPLANT
TROCAR ADV FIXATION 12X100MM (TROCAR) ×2 IMPLANT
TROCAR ADV FIXATION 5X100MM (TROCAR) ×3 IMPLANT
TROCAR BLADELESS OPT 5 100 (ENDOMECHANICALS) ×3 IMPLANT
TROCAR XCEL 12X100 BLDLESS (ENDOMECHANICALS) IMPLANT
TUBING INSUF HEATED (TUBING) ×3 IMPLANT

## 2017-03-04 NOTE — Anesthesia Preprocedure Evaluation (Addendum)
Anesthesia Evaluation  Patient identified by MRN, date of birth, ID band Patient awake    Reviewed: Allergy & Precautions, NPO status , Patient's Chart, lab work & pertinent test results  History of Anesthesia Complications Negative for: history of anesthetic complications  Airway Mallampati: I   Neck ROM: Full    Dental  (+) Caps, Chipped, Dental Advisory Given   Pulmonary former smoker,    breath sounds clear to auscultation       Cardiovascular (-) anginanegative cardio ROS   Rhythm:Regular Rate:Normal     Neuro/Psych    GI/Hepatic Neg liver ROS, GERD  Medicated and Poorly Controlled,diverticulosis   Endo/Other  Hypothyroidism   Renal/GU negative Renal ROS     Musculoskeletal  (+) Fibromyalgia -  Abdominal   Peds  Hematology negative hematology ROS (+)   Anesthesia Other Findings   Reproductive/Obstetrics                            Anesthesia Physical Anesthesia Plan  ASA: III  Anesthesia Plan: General   Post-op Pain Management:    Induction: Intravenous and Rapid sequence  Airway Management Planned: Oral ETT  Additional Equipment:   Intra-op Plan:   Post-operative Plan: Extubation in OR  Informed Consent: I have reviewed the patients History and Physical, chart, labs and discussed the procedure including the risks, benefits and alternatives for the proposed anesthesia with the patient or authorized representative who has indicated his/her understanding and acceptance.   Dental advisory given  Plan Discussed with: CRNA and Surgeon  Anesthesia Plan Comments: (Plan routine monitors, GETA)        Anesthesia Quick Evaluation

## 2017-03-04 NOTE — Transfer of Care (Signed)
Immediate Anesthesia Transfer of Care Note  Patient: Sara Davila  Procedure(s) Performed: Procedure(s): LAPAROSCOPY DIAGNOSTIC, ENTEROLYSIS (N/A) APPENDECTOMY LAPAROSCOPIC (N/A)  Patient Location: PACU  Anesthesia Type:General  Level of Consciousness:  sedated, patient cooperative and responds to stimulation  Airway & Oxygen Therapy:Patient Spontanous Breathing and Patient connected to face mask oxgen  Post-op Assessment:  Report given to PACU RN and Post -op Vital signs reviewed and stable  Post vital signs:  Reviewed and stable  Last Vitals:  Vitals:   03/03/17 1958 03/04/17 0516  BP: 95/65 107/65  Pulse: 95 83  Resp: 16 15  Temp: 36.6 C 49.7 C    Complications: No apparent anesthesia complications

## 2017-03-04 NOTE — Interval H&P Note (Signed)
History and Physical Interval Note:  03/04/2017 7:36 AM  Sara Davila  has presented today for surgery, with the diagnosis of chronic pneumoperitoneum  The various methods of treatment have been discussed with the patient and family. After consideration of risks, benefits and other options for treatment, the patient has consented to  Procedure(s): LAPAROSCOPY DIAGNOSTIC (N/A) POSSIBLE EXPLORATORY LAPAROTOMY (N/A) as a surgical intervention .  The patient's history has been reviewed, patient examined, no change in status, stable for surgery.  I have reviewed the patient's chart and labs.  Questions were answered to the patient's satisfaction.     Lakeyshia Tuckerman B

## 2017-03-04 NOTE — Anesthesia Postprocedure Evaluation (Signed)
Anesthesia Post Note  Patient: Sara Davila  Procedure(s) Performed: Procedure(s) (LRB): LAPAROSCOPY DIAGNOSTIC, ENTEROLYSIS (N/A) APPENDECTOMY LAPAROSCOPIC (N/A)  Patient location during evaluation: PACU Anesthesia Type: General Level of consciousness: awake and alert, oriented and patient cooperative Pain management: pain level controlled Vital Signs Assessment: post-procedure vital signs reviewed and stable Respiratory status: spontaneous breathing, nonlabored ventilation, respiratory function stable and patient connected to nasal cannula oxygen Cardiovascular status: blood pressure returned to baseline and stable Postop Assessment: no signs of nausea or vomiting Anesthetic complications: no       Last Vitals:  Vitals:   03/04/17 1030 03/04/17 1058  BP: 129/67 133/67  Pulse: 79 81  Resp: 15 16  Temp: 36.4 C 36.7 C    Last Pain:  Vitals:   03/04/17 1030  TempSrc:   PainSc: 4                  Sherrita Riederer,E. Djimon Lundstrom

## 2017-03-04 NOTE — Interval H&P Note (Signed)
History and Physical Interval Note:  03/04/2017 7:36 AM  Sara Davila  has presented today for surgery, with the diagnosis of chronic pneumoperitoneum  The various methods of treatment have been discussed with the patient and family. After consideration of risks, benefits and other options for treatment, the patient has consented to  Procedure(s): LAPAROSCOPY DIAGNOSTIC (N/A) POSSIBLE EXPLORATORY LAPAROTOMY (N/A) as a surgical intervention .  The patient's history has been reviewed, patient examined, no change in status, stable for surgery.  I have reviewed the patient's chart and labs.  Questions were answered to the patient's satisfaction.     [name] B

## 2017-03-04 NOTE — Interval H&P Note (Signed)
History and Physical Interval Note:  03/04/2017 7:36 AM  Sara Davila  has presented today for surgery, with the diagnosis of chronic pneumoperitoneum  The various methods of treatment have been discussed with the patient and family. After consideration of risks, benefits and other options for treatment, the patient has consented to  Procedure(s): LAPAROSCOPY DIAGNOSTIC (N/A) POSSIBLE EXPLORATORY LAPAROTOMY (N/A) as a surgical intervention .  The patient's history has been reviewed, patient examined, no change in status, stable for surgery.  I have reviewed the patient's chart and labs.  Questions were answered to the patient's satisfaction.     Sharese Manrique B

## 2017-03-04 NOTE — H&P (View-Only) (Signed)
[facility] Progress Note     Subjective: CC watery rectal leakage and generalized fatigue. She has a known Hx poor rectal tone with rectal leakage feels it is exacerbated by clear liquid diet. She denies fever, chills, nausea, or vomiting. She still endorses abdominal discomfort/fullness but denies pain.   Objective: Vital signs in last 24 hours: Temp:  [97.5 F (36.4 C)-98.9 F (37.2 C)] 98.1 F (36.7 C) (04/03 0517) Pulse Rate:  [73-89] 89 (04/03 0517) Resp:  [16-17] 16 (04/03 0517) BP: (127-144)/(65-76) 127/73 (04/03 0517) SpO2:  [97 %-99 %] 98 % (04/03 0517) Last BM Date: 03/01/17  Intake/Output from previous day: 04/02 0701 - 04/03 0700 In: 2835 [P.O.:960; I.V.:1875] Out: 0  Intake/Output this shift: No intake/output data recorded.  PE: Gen:  Alert, NAD, pleasant Card:  Regular rate and rhythm Pulm:  Non-labored, clear to auscultation bilaterally Abd: Soft, non-tender, moderate distention, bowel sounds present in all 4 quadrants, no hernias or masses. Ext:  No erythema, edema, or tenderness   Lab Results:   Recent Labs  03/01/17 0522  WBC 5.9  HGB 11.9*  HCT 35.4*  PLT 464*   BMET  Recent Labs  03/02/17 0820  NA 134*  K 4.1  CL 104  CO2 25  GLUCOSE 106*  BUN <5*  CREATININE 0.63  CALCIUM 9.0   PT/INR No results for input(s): LABPROT, INR in the last 72 hours. CMP     Component Value Date/Time   NA 134 (L) 03/02/2017 0820   K 4.1 03/02/2017 0820   CL 104 03/02/2017 0820   CO2 25 03/02/2017 0820   GLUCOSE 106 (H) 03/02/2017 0820   BUN <5 (L) 03/02/2017 0820   CREATININE 0.63 03/02/2017 0820   CALCIUM 9.0 03/02/2017 0820   PROT 7.4 02/26/2017 1314   ALBUMIN 4.4 02/26/2017 1314   AST 29 02/26/2017 1314   ALT 20 02/26/2017 1314   ALKPHOS 50 02/26/2017 1314   BILITOT 0.6 02/26/2017 1314   GFRNONAA >60 03/02/2017 0820   GFRAA >60 03/02/2017 0820   Lipase     Component Value Date/Time   LIPASE 21 02/26/2017 1314        Studies/Results: No results found.  Anti-infectives: Anti-infectives    Start     Dose/Rate Route Frequency Ordered Stop   02/26/17 1600  piperacillin-tazobactam (ZOSYN) IVPB 3.375 g  Status:  Discontinued     3.375 g 12.5 mL/hr over 240 Minutes Intravenous Every 8 hours 02/26/17 1524 02/28/17 0824   02/26/17 1330  piperacillin-tazobactam (ZOSYN) IVPB 3.375 g     3.375 g 100 mL/hr over 30 Minutes Intravenous  Once 02/26/17 1326 02/26/17 1408       Assessment/Plan Peritoneal free air, chronic  CT shows no contrast extravagation. WBC normal. No acute surgical indication but patient does have signs of a chronic partial SBO of the distal TI. Repeat Abd film shows contrast passing through the colon and still no extravagation.   Hyponatremia - mild, asymptomatic. 134. Follow.  Plan: continue clears + Boost Breeze. D/c miralax. NPO after MN. Diagnostic laparoscopy tomorrow.  Consent for surgery ordered.     LOS: 4 days    [facility] 03/02/2017, 9:11 AM Pager: [phone] Consults: [phone] Mon-Fri 7:00 am-4:30 pm Sat-Sun 7:00 am-11:30 am

## 2017-03-04 NOTE — Anesthesia Procedure Notes (Signed)
Procedure Name: Intubation Date/Time: 03/04/2017 7:49 AM Performed by: Maxwell Caul Pre-anesthesia Checklist: Patient identified, Emergency Drugs available, Suction available and Patient being monitored Patient Re-evaluated:Patient Re-evaluated prior to inductionOxygen Delivery Method: Circle system utilized Preoxygenation: Pre-oxygenation with 100% oxygen Intubation Type: IV induction, Rapid sequence and Cricoid Pressure applied Laryngoscope Size: Mac and 4 Grade View: Grade I Tube type: Oral Tube size: 7.0 mm Number of attempts: 1 Airway Equipment and Method: Stylet Placement Confirmation: ETT inserted through vocal cords under direct vision,  positive ETCO2 and breath sounds checked- equal and bilateral Secured at: 21 cm Tube secured with: Tape Dental Injury: Teeth and Oropharynx as per pre-operative assessment

## 2017-03-04 NOTE — Op Note (Signed)
Surgeon: Kaylyn Lim, MD, FACS  Asst:  none  Anes:  general  Preop Dx: History of free air without acute abdomen Postop Dx: same  Procedure: Laparoscopy, enterolysis of adhesion kinking the terminal ileum, appendectomy, documentation of large jejunal diverticula Location Surgery: [facility] # 4 Complications: None known  EBL:   minimal cc  Drains: none  Description of Procedure:  The patient was taken to OR 4 .  After anesthesia was administered and the patient was prepped a timeout was performed.  Access to her tiny, thin abdomen was made through the left upper quadrant.  A second port was placed in the upper midline and in the left lower quadrant.  There was no peritonitis.  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  In running the small bowel, I found the terminal ileum to have an acute kink where it was stuck to the pelvic brim.  Just above this was a small hernia in the pelvis where she no doubt had a bone graft taken for her rodding of her spine.  I took down this adhesion with the Harmonic scalpel.  Beneath it the very long appendix appeared stuck in the pelvis.  I relieved this with the Harmonic scalpel, divided the appendiceal mesentery, and amputated it from the cecal stump with the 4.5 cm stapler.  It was removed from the abdomen in a plastic specimen bag.  Photos were taken to be saved in the medical record with the new [employer] system.  The 12 mm port in the midline was closed with a 0 Vicryl figure of 8 and simple sutures were used in the two 5 mm ports.  Monocryl and Dermabond completed the closure after Exparel was injected in to the subcutaneous zone.     The patient tolerated the procedure well and was taken to the PACU in stable condition.     Matt B. Hassell Done, [place], [facility], [place]

## 2017-03-05 LAB — CBC
HCT: 29.7 % — ABNORMAL LOW (ref 36.0–46.0)
Hemoglobin: 10.1 g/dL — ABNORMAL LOW (ref 12.0–15.0)
MCH: 31.2 pg (ref 26.0–34.0)
MCHC: 34 g/dL (ref 30.0–36.0)
MCV: 91.7 fL (ref 78.0–100.0)
Platelets: 442 10*3/uL — ABNORMAL HIGH (ref 150–400)
RBC: 3.24 MIL/uL — ABNORMAL LOW (ref 3.87–5.11)
RDW: 13.3 % (ref 11.5–15.5)
WBC: 9.3 10*3/uL (ref 4.0–10.5)

## 2017-03-05 LAB — COMPREHENSIVE METABOLIC PANEL
ALT: 12 U/L — ABNORMAL LOW (ref 14–54)
AST: 18 U/L (ref 15–41)
Albumin: 2.9 g/dL — ABNORMAL LOW (ref 3.5–5.0)
Alkaline Phosphatase: 38 U/L (ref 38–126)
Anion gap: 5 (ref 5–15)
BUN: 8 mg/dL (ref 6–20)
CO2: 24 mmol/L (ref 22–32)
Calcium: 8.4 mg/dL — ABNORMAL LOW (ref 8.9–10.3)
Chloride: 107 mmol/L (ref 101–111)
Creatinine, Ser: 0.64 mg/dL (ref 0.44–1.00)
GFR calc Af Amer: >60 mL/min (ref >60)
GFR calc non Af Amer: >60 mL/min (ref >60)
Glucose, Bld: 100 mg/dL — ABNORMAL HIGH (ref 65–99)
Potassium: 3.7 mmol/L (ref 3.5–5.1)
Sodium: 136 mmol/L (ref 135–145)
Total Bilirubin: 0.6 mg/dL (ref 0.3–1.2)
Total Protein: 5.3 g/dL — ABNORMAL LOW (ref 6.5–8.1)

## 2017-03-05 MED ORDER — PANTOPRAZOLE SODIUM 40 MG PO TBEC
40.0000 mg | DELAYED_RELEASE_TABLET | Freq: Every day | ORAL | Status: DC
  Administered 2017-03-05 – 2017-03-07 (×3): 40 mg via ORAL
  Filled 2017-03-05 (×3): qty 1

## 2017-03-05 MED ORDER — ACETAMINOPHEN 500 MG PO TABS: 500.0000 mg | ORAL_TABLET | Freq: Four times a day (QID) | ORAL | Status: DC | PRN

## 2017-03-05 MED ORDER — POLYETHYLENE GLYCOL 3350 17 G PO PACK
17.0000 g | PACK | Freq: Every day | ORAL | Status: DC | PRN
  Administered 2017-03-05: 17 g via ORAL
  Filled 2017-03-05: qty 1

## 2017-03-05 MED ORDER — TRAMADOL HCL 50 MG PO TABS
50.0000 mg | ORAL_TABLET | Freq: Four times a day (QID) | ORAL | Status: DC | PRN
  Administered 2017-03-05 – 2017-03-07 (×5): 50 mg via ORAL
  Filled 2017-03-05 (×5): qty 1

## 2017-03-05 NOTE — Progress Notes (Signed)
[facility] Progress Note  1 Day Post-Op  Subjective: CC desire to have a good BM. She is pleased with the outcome of her surgery and hopeful It will improve her symptoms. Pain controlled. Tolerating PO. Having flatus. Ambulating multiple times daily.  Objective: Vital signs in last 24 hours: Temp:  [97.6 F (36.4 C)-98.7 F (37.1 C)] 98.2 F (36.8 C) (04/06 0553) Pulse Rate:  [74-88] 74 (04/06 0553) Resp:  [13-16] 16 (04/06 0553) BP: (92-136)/(47-85) 112/49 (04/06 0553) SpO2:  [94 %-100 %] 100 % (04/06 0553) Last BM Date: 03/03/17  Intake/Output from previous day: 04/05 0701 - 04/06 0700 In: 4335.3 [P.O.:717; I.V.:3568.3; IV Piggyback:50] Out: 860 [Urine:850; Blood:10] Intake/Output this shift: Total I/O In: 320 [P.O.:320] Out: -   PE: Gen:  Alert, NAD, pleasant Pulm:  Normal respiratory effort, clear to auscultation bilaterally Abd: Soft, non-tender, mild distention, bowel sounds present in all 4 quadrants, incisions C/D/I   Lab Results:   Recent Labs  03/04/17 1145 03/05/17 0515  WBC 14.1* 9.3  HGB 12.0 10.1*  HCT 35.7* 29.7*  PLT 474* 442*   BMET  Recent Labs  03/04/17 1145 03/05/17 0515  NA  --  136  K  --  3.7  CL  --  107  CO2  --  24  GLUCOSE  --  100*  BUN  --  8  CREATININE 0.70 0.64  CALCIUM  --  8.4*   PT/INR No results for input(s): LABPROT, INR in the last 72 hours. CMP     Component Value Date/Time   NA 136 03/05/2017 0515   K 3.7 03/05/2017 0515   CL 107 03/05/2017 0515   CO2 24 03/05/2017 0515   GLUCOSE 100 (H) 03/05/2017 0515   BUN 8 03/05/2017 0515   CREATININE 0.64 03/05/2017 0515   CALCIUM 8.4 (L) 03/05/2017 0515   PROT 5.3 (L) 03/05/2017 0515   ALBUMIN 2.9 (L) 03/05/2017 0515   AST 18 03/05/2017 0515   ALT 12 (L) 03/05/2017 0515   ALKPHOS 38 03/05/2017 0515   BILITOT 0.6 03/05/2017 0515   GFRNONAA >60 03/05/2017 0515   GFRAA >60 03/05/2017 0515   Lipase     Component Value Date/Time   LIPASE 21  02/26/2017 1314       Studies/Results: No results found.  Anti-infectives: Anti-infectives    Start     Dose/Rate Route Frequency Ordered Stop   03/04/17 2000  cefoTEtan (CEFOTAN) 1 g in dextrose 5 % 50 mL IVPB     1 g 100 mL/hr over 30 Minutes Intravenous Every 12 hours 03/04/17 1115 03/04/17 2105   03/04/17 0736  cefoTEtan in Dextrose 5% (CEFOTAN) 2-2.08 GM-% IVPB    Comments:  Carleene Cooper   : cabinet override      03/04/17 0736 03/04/17 1944   02/26/17 1600  piperacillin-tazobactam (ZOSYN) IVPB 3.375 g  Status:  Discontinued     3.375 g 12.5 mL/hr over 240 Minutes Intravenous Every 8 hours 02/26/17 1524 02/28/17 0824   02/26/17 1330  piperacillin-tazobactam (ZOSYN) IVPB 3.375 g     3.375 g 100 mL/hr over 30 Minutes Intravenous  Once 02/26/17 1326 02/26/17 1408     Assessment/Plan Peritoneal free air, chronic  S/P Laparoscopy, enterolysis of adhesion kinking the terminal ileum, appendectomy, documentation of large jejunal diverticula - afebrile, VSS - having flatus and denies BM - mobilize/IS  - continue probiotic and PRN miralax  FEN - full liquid diet, Boost TID, electrolytes WNL  ID: perioperative cefotetan x1 VTE:  SCD's, SQ heparin  Pain: PRN tylenol, ULTRAM 50 mg q 6h PRN, morphine for breakthrough   Plan: advance diet to full liquids, mobilize Discharge this weekend after patient has a good BM .    LOS: 7 days    [facility] 03/05/2017, 9:46 AM Pager: [phone] Consults: [phone] Mon-Fri 7:00 am-4:30 pm Sat-Sun 7:00 am-11:30 am

## 2017-03-05 NOTE — Progress Notes (Signed)
The patient is receiving Protonix by the intravenous route.  Based on criteria approved by the Pharmacy and [place], the medication is being converted to the equivalent oral dose form.  These criteria include: -No active GI bleeding -Able to tolerate diet of full liquids (or better) or tube feeding -Able to tolerate other medications by the oral or enteral route  If you have any questions about this conversion, please contact the Pharmacy Department (phone [phone]).  Thank you.   Minda Ditto PharmD Pager [phone] 03/05/2017, 10:28 AM

## 2017-03-05 NOTE — Discharge Instructions (Signed)
please arrive at least 30 min before your appointment to complete your check in paperwork.  If you are unable to arrive 30 min prior to your appointment time we may have to cancel or reschedule you. ° °LAPAROSCOPIC SURGERY: POST OP INSTRUCTIONS  °1. DIET: Follow a light bland diet the first 24 hours after arrival home, such as soup, liquids, crackers, etc. Be sure to include lots of fluids daily. Avoid fast food or heavy meals as your are more likely to get nauseated. Eat a low fat the next few days after surgery.  °2. Take your usually prescribed home medications unless otherwise directed. °3. PAIN CONTROL:  °1. Pain is best controlled by a usual combination of three different methods TOGETHER:  °1. Ice/Heat °2. Over the counter pain medication °3. Prescription pain medication °2. Most patients will experience some swelling and bruising around the incisions. Ice packs or heating pads (30-60 minutes up to 6 times a day) will help. Use ice for the first few days to help decrease swelling and bruising, then switch to heat to help relax tight/sore spots and speed recovery. Some people prefer to use ice alone, heat alone, alternating between ice & heat. Experiment to what works for you. Swelling and bruising can take several weeks to resolve.  °3. It is helpful to take an over-the-counter pain medication regularly for the first few weeks. Choose one of the following that works best for you:  °1. Naproxen (Aleve, etc) Two 220mg tabs twice a day °2. Ibuprofen (Advil, etc) Three 200mg tabs four times a day (every meal & bedtime) °3. Acetaminophen (Tylenol, etc) 500-650mg four times a day (every meal & bedtime) °4. A prescription for pain medication (such as oxycodone, hydrocodone, etc) should be given to you upon discharge. Take your pain medication as prescribed.  °1. If you are having problems/concerns with the prescription medicine (does not control pain, nausea, vomiting, rash, itching, etc), please call us [phone]  [phone] to see if we need to switch you to a different pain medicine that will work better for you and/or control your side effect better. °2. If you need a refill on your pain medication, please contact your pharmacy. They will contact our office to request authorization. Prescriptions will not be filled after 5 pm or on week-ends. °4. Avoid getting constipated. Between the surgery and the pain medications, it is common to experience some constipation. Increasing fluid intake and taking a fiber supplement (such as Metamucil, Citrucel, FiberCon, MiraLax, etc) 1-2 times a day regularly will usually help prevent this problem from occurring. A mild laxative (prune juice, Milk of Magnesia, MiraLax, etc) should be taken according to package directions if there are no bowel movements after 48 hours.  °5. Watch out for diarrhea. If you have many loose bowel movements, simplify your diet to bland foods & liquids for a few days. Stop any stool softeners and decrease your fiber supplement. Switching to mild anti-diarrheal medications (Kayopectate, Pepto Bismol) can help. If this worsens or does not improve, please call us. °6. Wash / shower every day. You may shower over the dressings as they are waterproof. Continue to shower over incision(s) after the dressing is off. °7. Remove your waterproof bandages 5 days after surgery. You may leave the incision open to air. You may replace a dressing/Band-Aid to cover the incision for comfort if you wish.  °8. ACTIVITIES as tolerated:  °1. You may resume regular (light) daily activities beginning the next day--such as daily self-care, walking, climbing stairs--gradually   increasing activities as tolerated. If you can walk 30 minutes without difficulty, it is safe to try more intense activity such as jogging, treadmill, bicycling, low-impact aerobics, swimming, etc. °2. Save the most intensive and strenuous activity for last such as sit-ups, heavy lifting, contact sports, etc Refrain  from any heavy lifting or straining until you are off narcotics for pain control.  °3. DO NOT PUSH THROUGH PAIN. Let pain be your guide: If it hurts to do something, don't do it. Pain is your body warning you to avoid that activity for another week until the pain goes down. °4. You may drive when you are no longer taking prescription pain medication, you can comfortably wear a seatbelt, and you can safely maneuver your car and apply brakes. °5. You may have sexual intercourse when it is comfortable.  °9. FOLLOW UP in our office  °1. Please call [facility] at [phone] to set up an appointment to see your surgeon in the office for a follow-up appointment approximately 2-3 weeks after your surgery. °2. Make sure that you call for this appointment the day you arrive home to insure a convenient appointment time. °     10. IF YOU HAVE DISABILITY OR FAMILY LEAVE FORMS, BRING THEM TO THE               OFFICE FOR PROCESSING.  ° °WHEN TO CALL US [phone]:  °1. Poor pain control °2. Reactions / problems with new medications (rash/itching, nausea, etc)  °3. Fever over 101.5 F (38.5 C) °4. Inability to urinate °5. Nausea and/or vomiting °6. Worsening swelling or bruising °7. Continued bleeding from incision. °8. Increased pain, redness, or drainage from the incision ° °The clinic staff is available to answer your questions during regular business hours (8:30am-5pm). Please don’t hesitate to call and ask to speak to one of our nurses for clinical concerns.  °If you have a medical emergency, go to the nearest emergency room or call 911.  °A surgeon from [facility] is always on call at the hospitals  ° °[facility]  °[address], [address], [place], [place] [address] ?  °MAIN: [phone] ? TOLL FREE: [phone] ?  °FAX [phone]  °[email or web address] ° °

## 2017-03-06 NOTE — Progress Notes (Signed)
[facility] Office:  [phone] General Surgery Progress Note   LOS: 8 days  POD -  2 Days Post-Op  Chief Complaint: Has not had BM  Assessment and Plan: 1.  LAPAROSCOPY DIAGNOSTIC, ENTEROLYSIS, APPENDECTOMY LAPAROSCOPIC - 03/04/2017 - [facility] Done  Tethered TI causing possible partial SBO and jejunal diverticula  On full liquids - not ready to be advanced.   2.  Pneumoperitoneum 3.  DVT prophylaxis - SQ Heparin   Principal Problem:   Peritoneal free air Active Problems:   Abdominal pain   Hypothyroidism  Subjective:  On full liquids, but does not want more now.  She is passing flatus, but no BM. She lives at [facility] and has an apartment there.  Objective:   Vitals:   03/05/17 2156 03/06/17 0541  BP: 109/60 125/73  Pulse: 77 77  Resp: 15 16  Temp: 97.3 F (36.3 C) 98 F (36.7 C)     Intake/Output from previous day:  04/06 0701 - 04/07 0700 In: 3637 [P.O.:1337; I.V.:2300] Out: 800 [Urine:800]  Intake/Output this shift:  No intake/output data recorded.   Physical Exam:   General: Thin older WF who is alert and oriented.    HEENT: Normal. Pupils equal. .   Lungs: Clear   Abdomen: Distended, but soft.  Has BS.   Wound: Clean   Lab Results:    Recent Labs  03/04/17 1145 03/05/17 0515  WBC 14.1* 9.3  HGB 12.0 10.1*  HCT 35.7* 29.7*  PLT 474* 442*    BMET   Recent Labs  03/04/17 1145 03/05/17 0515  NA  --  136  K  --  3.7  CL  --  107  CO2  --  24  GLUCOSE  --  100*  BUN  --  8  CREATININE 0.70 0.64  CALCIUM  --  8.4*    PT/INR  No results for input(s): LABPROT, INR in the last 72 hours.  ABG  No results for input(s): PHART, HCO3 in the last 72 hours.  Invalid input(s): PCO2, PO2   Studies/Results:  No results found.   Anti-infectives:   Anti-infectives    Start     Dose/Rate Route Frequency Ordered Stop   03/04/17 2000  cefoTEtan (CEFOTAN) 1 g in dextrose 5 % 50 mL IVPB     1 g 100 mL/hr over 30 Minutes Intravenous  Every 12 hours 03/04/17 1115 03/04/17 2105   03/04/17 0736  cefoTEtan in Dextrose 5% (CEFOTAN) 2-2.08 GM-% IVPB    Comments:  Armistead, Bella Kennedy   : cabinet override      03/04/17 0736 03/04/17 1944   02/26/17 1600  piperacillin-tazobactam (ZOSYN) IVPB 3.375 g  Status:  Discontinued     3.375 g 12.5 mL/hr over 240 Minutes Intravenous Every 8 hours 02/26/17 1524 02/28/17 0824   02/26/17 1330  piperacillin-tazobactam (ZOSYN) IVPB 3.375 g     3.375 g 100 mL/hr over 30 Minutes Intravenous  Once 02/26/17 1326 02/26/17 1408      Alphonsa Overall, MD, FACS Pager: [facility] Office: [phone] 03/06/2017

## 2017-03-07 NOTE — Progress Notes (Signed)
[facility] Office:  [phone] General Surgery Progress Note   LOS: 9 days  POD -  3 Days Post-Op  Chief Complaint: LUQ pain - had small BM after suppository  Assessment and Plan: 1.  LAPAROSCOPY DIAGNOSTIC, ENTEROLYSIS, APPENDECTOMY LAPAROSCOPIC - 03/04/2017 - [facility] Done  Tethered TI causing possible partial SBO and jejunal diverticula  Advance to reg diet  2.  Pneumoperitoneum 3.  DVT prophylaxis - SQ Heparin   Principal Problem:   Peritoneal free air Active Problems:   Abdominal pain   Hypothyroidism  Subjective:  Tolerating full liquids and ready for reg food.  Wants to shower. She lives at [facility] and has an apartment there.  Objective:   Vitals:   03/06/17 2139 03/07/17 0605  BP: (!) 122/56 118/65  Pulse: 72 70  Resp: 16 16  Temp: 98.2 F (36.8 C) 98.3 F (36.8 C)     Intake/Output from previous day:  04/07 0701 - 04/08 0700 In: 810 [P.O.:810] Out: -   Intake/Output this shift:  No intake/output data recorded.   Physical Exam:   General: Thin older WF who is alert and oriented.    HEENT: Normal. Pupils equal. .   Lungs: Clear   Abdomen: Distended, but soft.  Has BS.   Wound: Clean, Points to LUQ where is is sore - it is near the LUQ trocar site.   Lab Results:     Recent Labs  03/04/17 1145 03/05/17 0515  WBC 14.1* 9.3  HGB 12.0 10.1*  HCT 35.7* 29.7*  PLT 474* 442*    BMET    Recent Labs  03/04/17 1145 03/05/17 0515  NA  --  136  K  --  3.7  CL  --  107  CO2  --  24  GLUCOSE  --  100*  BUN  --  8  CREATININE 0.70 0.64  CALCIUM  --  8.4*    PT/INR  No results for input(s): LABPROT, INR in the last 72 hours.  ABG  No results for input(s): PHART, HCO3 in the last 72 hours.  Invalid input(s): PCO2, PO2   Studies/Results:  No results found.   Anti-infectives:   Anti-infectives    Start     Dose/Rate Route Frequency Ordered Stop   03/04/17 2000  cefoTEtan (CEFOTAN) 1 g in dextrose 5 % 50 mL IVPB     1  g 100 mL/hr over 30 Minutes Intravenous Every 12 hours 03/04/17 1115 03/04/17 2105   03/04/17 0736  cefoTEtan in Dextrose 5% (CEFOTAN) 2-2.08 GM-% IVPB    Comments:  Armistead, Bella Kennedy   : cabinet override      03/04/17 0736 03/04/17 1944   02/26/17 1600  piperacillin-tazobactam (ZOSYN) IVPB 3.375 g  Status:  Discontinued     3.375 g 12.5 mL/hr over 240 Minutes Intravenous Every 8 hours 02/26/17 1524 02/28/17 0824   02/26/17 1330  piperacillin-tazobactam (ZOSYN) IVPB 3.375 g     3.375 g 100 mL/hr over 30 Minutes Intravenous  Once 02/26/17 1326 02/26/17 1408      Alphonsa Overall, MD, FACS Pager: [facility] Office: [phone] 03/07/2017

## 2017-03-08 ENCOUNTER — Ambulatory Visit: Payer: Medicare Other | Admitting: Neurology

## 2017-03-08 MED ORDER — ACETAMINOPHEN 500 MG PO TABS: 500.0000 mg | ORAL_TABLET | Freq: Four times a day (QID) | ORAL | 0 refills | Status: DC | PRN

## 2017-03-08 MED ORDER — TRAMADOL HCL 50 MG PO TABS: 50.0000 mg | ORAL_TABLET | Freq: Four times a day (QID) | ORAL | 0 refills | Status: DC | PRN

## 2017-03-08 NOTE — Discharge Summary (Signed)
[facility] Discharge Summary   Patient ID: Sara Davila MRN: [id number] DOB/AGE: 01-Oct-1930 83 y.o.  Admit date: 02/26/2017 Discharge date: 03/08/2017  Discharge Diagnosis Patient Active Problem List   Diagnosis Date Noted  . Abdominal pain 02/26/2017  . Peritoneal free air, chronic 03/29/2012   Consultants none  Imaging: 02/26/17 CT ABD/PELV W/  02/27/17 DG ABD -  The CT scan from yesterday demonstrated pneumoperitoneum. This is not well assessed on this portable supine film with colonic contrast. No convincing evidence of pneumoperitoneum on this study however. There is contrast throughout the colon. No evidence of bowel obstruction. The bones and soft tissues are otherwise unremarkable.  Procedures Dr. Johnathan Hausen (03/04/17) -  Laparoscopy, enterolysis of adhesion kinking the terminal ileum, appendectomy, documentation of large jejunal diverticula   Hospital Course:  83 y.o. Female who presented with 2-3 months of abdominal distention.  CT scan showed pneumoperitoneum.  Prior to admission she reported regular bowel function, with intermittent constipation, and no significant abdominal pain, just "discomfort" due to the distention. Patient was admitted and underwent procedure listed above. Tolerated procedure well and was transferred to the floor.  Diet was advanced as tolerated.  On POD#4, the patient was voiding well, tolerating diet, ambulating well, pain well controlled, vital signs stable, incisions c/d/I, having bowel movements, and felt stable for discharge home.  Patient will follow up in our office in 2-4 weeks and knows to call with questions or concerns.    Physical Exam: General:  Alert, NAD, pleasant, comfortable CV: RRR, pedal pulses 2+ Pulm: normal respiratory effort Abd:  Soft, non-tender, mild distention, incisions C/D/I, bowel sounds in all 4 abdominal quadrants Psych: A&Ox3  Allergies as of 03/08/2017      Reactions   Codeine Rash       Medication List    STOP taking these medications   gabapentin 300 MG capsule Commonly known as:  NEURONTIN     TAKE these medications   acetaminophen 500 MG tablet Commonly known as:  TYLENOL Take 1 tablet (500 mg total) by mouth every 6 (six) hours as needed for moderate pain or fever.   calcium carbonate 500 MG chewable tablet Commonly known as:  TUMS - dosed in mg elemental calcium Chew 1 tablet by mouth 2 (two) times daily with a meal.   GARLIC PO Take 1 tablet by mouth daily.   levothyroxine 75 MCG tablet Commonly known as:  SYNTHROID, LEVOTHROID Take 75 mcg by mouth daily before breakfast.   LORazepam 0.5 MG tablet Commonly known as:  ATIVAN Take 0.75 mg by mouth at bedtime as needed for anxiety or sleep.   omeprazole 20 MG capsule Commonly known as:  PRILOSEC Take 20 mg by mouth daily.   PROBIOTIC-10 Caps Take 1 capsule by mouth daily.   THERATEARS OP Apply 1 drop to eye 2 (two) times daily as needed (dry eyes).   traMADol 50 MG tablet Commonly known as:  ULTRAM Take 1 tablet (50 mg total) by mouth every 6 (six) hours as needed for moderate pain or severe pain.   VITAMIN D PO Take 1 capsule by mouth daily.   zolpidem 5 MG tablet Commonly known as:  AMBIEN Take 2.5 mg by mouth at bedtime as needed for sleep.       Contact information for follow-up providers    MARTIN,MATTHEW B, MD. Schedule an appointment as soon as possible for a visit.   Specialty:  General Surgery Why:  in 3 weeks for post-operatrive follow up  Contact information: [address] [place] [address] [phone]            Contact information for after-discharge care    Destination    [facility] .   Specialty:  Assisted Living Facility Contact information: [place] [phone]                  Signed: Obie Dredge, [facility] 03/08/2017, 10:34 AM Pager:  [phone] Consults: [phone] Mon-Fri 7:00 am-4:30 pm Sat-Sun 7:00 am-11:30 am

## 2017-03-08 NOTE — NC FL2 (Signed)
[place] LEVEL OF CARE SCREENING TOOL     IDENTIFICATION  Patient Name: Sara Davila Birthdate: [birth year] Sex: female Admission Date (Current Location): 02/26/2017  [facility] and [place] Number:  Herbalist and Address:  [facility],  [place] [address] [place]      Provider Number: [id number]  Attending Physician Name and Address:  Md Edison Pace, MD  Relative Name and Phone Number:       Current Level of Care: Hospital Recommended Level of Care: [place] Prior Approval Number:    Date Approved/Denied:   PASRR Number:    Discharge Plan:  (ALF)    Current Diagnoses: Patient Active Problem List   Diagnosis Date Noted  . Abdominal pain 02/26/2017  . Hypothyroidism 02/26/2017  . Toe pain 09/06/2016  . Paresthesias 09/06/2016  . Fall 03/29/2012  . Right rib fracture 03/29/2012  . Peritoneal free air 03/29/2012    Orientation RESPIRATION BLADDER Height & Weight     Self, Time, Situation, Place  Normal Continent Weight: 88 lb 12.8 oz (40.3 kg) Height:  5' (152.4 cm)  BEHAVIORAL SYMPTOMS/MOOD NEUROLOGICAL BOWEL NUTRITION STATUS      Continent Diet (Regular)  AMBULATORY STATUS COMMUNICATION OF NEEDS Skin   Supervision Verbally Normal                       Personal Care Assistance Level of Assistance  Bathing, Feeding, Dressing Bathing Assistance: Independent Feeding assistance: Independent Dressing Assistance: Independent     Functional Limitations Info  Sight, Hearing, Speech Sight Info: Adequate Hearing Info: Adequate Speech Info: Adequate    SPECIAL CARE FACTORS FREQUENCY                       Contractures Contractures Info: Not present    Additional Factors Info  Code Status, Allergies Code Status Info: Fullcode Allergies Info: Codeine           Current Medications (03/08/2017):  This is the current hospital active medication list Current Facility-Administered Medications   Medication Dose Route Frequency Provider Last Rate Last Dose  . acetaminophen (TYLENOL) tablet 500 mg  500 mg Oral Q6H PRN Darci Current Simaan, PA-C      . acidophilus (RISAQUAD) capsule 1 capsule  1 capsule Oral Daily Leighton Ruff, MD   1 capsule at 03/08/17 0911  . bisacodyl (DULCOLAX) suppository 10 mg  10 mg Rectal Daily PRN Leighton Ruff, MD   10 mg at 03/06/17 2118  . chlorhexidine (PERIDEX) 0.12 % solution 15 mL  15 mL Mouth Rinse BID Leighton Ruff, MD   15 mL at 03/08/17 0912  . feeding supplement (BOOST / RESOURCE BREEZE) liquid 1 Container  1 Container Oral TID BM Jill Alexanders, PA-C   1 Container at 03/07/17 1408  . heparin injection 5,000 Units  5,000 Units Subcutaneous Q8H Johnathan Hausen, MD   5,000 Units at 03/08/17 [phone]  . levothyroxine (SYNTHROID, LEVOTHROID) tablet 75 mcg  75 mcg Oral QAC breakfast Leighton Ruff, MD   75 mcg at 03/08/17 [phone]  . MEDLINE mouth rinse  15 mL Mouth Rinse [id number] Leighton Ruff, MD   15 mL at 03/05/17 2153  . morphine 2 MG/ML injection 2-4 mg  2-4 mg Intravenous Q3H PRN Minda Ditto, RPH   2 mg at 03/06/17 0135  . ondansetron (ZOFRAN-ODT) disintegrating tablet 4 mg  4 mg Oral Q6H PRN Johnathan Hausen, MD  Or  . ondansetron (ZOFRAN) injection 4 mg  4 mg Intravenous Q6H PRN Johnathan Hausen, MD      . ondansetron (ZOFRAN-ODT) disintegrating tablet 4 mg  4 mg Oral P9J PRN Leighton Ruff, MD      . pantoprazole (PROTONIX) EC tablet 40 mg  40 mg Oral QHS Minda Ditto, RPH   40 mg at 03/07/17 2140  . polyethylene glycol (MIRALAX / GLYCOLAX) packet 17 g  17 g Oral Daily PRN Jill Alexanders, PA-C   17 g at 03/05/17 1958  . traMADol (ULTRAM) tablet 50 mg  50 mg Oral Q6H PRN Jill Alexanders, PA-C   50 mg at 03/07/17 2146  . zolpidem (AMBIEN) tablet 2.5 mg  2.5 mg Oral QHS PRN Leighton Ruff, MD   2.5 mg at 03/05/17 2157     Discharge Medications: Please see discharge summary for a list of discharge medications.  Relevant Imaging  Results:  Relevant Lab Results:   Additional Information [id number]  Lia Hopping, LCSW

## 2017-03-08 NOTE — Evaluation (Signed)
Physical Therapy Evaluation Patient Details Name: Sara Davila MRN: [id number] DOB: 1930-05-25 Today's Date: 03/08/2017   History of Present Illness  Pt s/p laparoscopic appendectomy and with hx of lumbar rod placement and cervical fusion  Clinical Impression  Pt admitted as above and currently mobilizing at Mod I to IND level with good insight into abilities and good safety awareness.      Follow Up Recommendations No PT follow up    Equipment Recommendations  None recommended by PT    Recommendations for Other Services       Precautions / Restrictions Precautions Precautions: Fall Restrictions Weight Bearing Restrictions: No      Mobility  Bed Mobility Overal bed mobility: Modified Independent                Transfers Overall transfer level: Modified independent                  Ambulation/Gait Ambulation/Gait assistance: Min guard;Supervision;Independent Ambulation Distance (Feet): 500 Feet Assistive device: None Gait Pattern/deviations: WFL(Within Functional Limits);Wide base of support Gait velocity: mod pace   General Gait Details: Mild instability initially but no LOB and improving with incresaed distance walked.  Pt able to step fwd, bk and sideways as well as stork stand with good balance but mild difficulty noted with tandem step.  Stairs            Wheelchair Mobility    Modified Rankin (Stroke Patients Only)       Balance Overall balance assessment: Needs assistance Sitting-balance support: No upper extremity supported;Feet supported Sitting balance-Leahy Scale: Normal     Standing balance support: No upper extremity supported Standing balance-Leahy Scale: Good                               Pertinent Vitals/Pain Pain Assessment: 0-10 Pain Score: 2  Pain Location: L abdomen Pain Descriptors / Indicators: Sore Pain Intervention(s): Limited activity within patient's tolerance;Monitored during session     Home Living Family/patient expects to be discharged to:: Private residence Living Arrangements: Alone Available Help at Discharge: Available PRN/intermittently Type of Home: Apartment Home Access: Level entry     Home Layout: One level Home Equipment: Cane - single point Additional Comments: Pt resides in IND living at [facility]    Prior Function Level of Independence: Independent               Hand Dominance        Extremity/Trunk Assessment   Upper Extremity Assessment Upper Extremity Assessment: Overall WFL for tasks assessed    Lower Extremity Assessment Lower Extremity Assessment: Overall WFL for tasks assessed       Communication   Communication: No difficulties  Cognition Arousal/Alertness: Awake/alert Behavior During Therapy: WFL for tasks assessed/performed Overall Cognitive Status: Within Functional Limits for tasks assessed                                        General Comments      Exercises     Assessment/Plan    PT Assessment Patent does not need any further PT services  PT Problem List         PT Treatment Interventions      PT Goals (Current goals can be found in the Care Plan section)  Acute Rehab PT Goals Patient Stated Goal: Regain IND  Frequency     Barriers to discharge        Co-evaluation               End of Session Equipment Utilized During Treatment: Gait belt Activity Tolerance: Patient tolerated treatment well Patient left: in bed Nurse Communication: Mobility status PT Visit Diagnosis: Unsteadiness on feet (R26.81)    Time: 7619-5093 PT Time Calculation (min) (ACUTE ONLY): 15 min   Charges:   PT Evaluation $PT Eval Low Complexity: 1 Procedure     PT G Codes:        Pg [phone]  Sara Davila 03/08/2017, 11:58 AM

## 2017-03-08 NOTE — Progress Notes (Signed)
CSW discussed discharge with Margreta Journey at [facility]. She reports patient is requesting to return to ALF for a couple of days because patient does not want to over do it, she currently lives in a independent apartment.  CSW completed FL2 for ALF placement and sent. CSW confirmed that facility will transport patient at 11:30 am today. RN notified.  No other needs identified at this time.   Kathrin Greathouse, Latanya Presser, MSW Clinical Social Worker 5E and Psychiatric Service Line [phone] 03/08/2017  10:33 AM

## 2017-06-16 ENCOUNTER — Other Ambulatory Visit: Payer: Self-pay | Admitting: Neurosurgery

## 2017-06-16 DIAGNOSIS — M5412 Radiculopathy, cervical region: Secondary | ICD-10-CM

## 2017-06-19 IMAGING — RF DG ESOPHAGUS
19 of 24 series · 19 of 24 positions shown · non-contrast
Comparison: No priors.

CLINICAL DATA: 84-year-old female with history of dysphagia.

EXAM:
ESOPHOGRAM / BARIUM SWALLOW / BARIUM TABLET STUDY
TECHNIQUE: Combined double contrast and single contrast examination performed
using effervescent crystals, thick barium liquid, and thin barium
liquid. The patient was observed with fluoroscopy swallowing a 13 mm
barium sulphate tablet.
FLUOROSCOPY TIME:  If the device does not provide the exposure
index:
Fluoroscopy Time:  3 minutes and 6 seconds.
Number of Acquired Images:  27

[Series 1: run · 1 of 1 slices shown (1 of 19)]
[im 1/1]
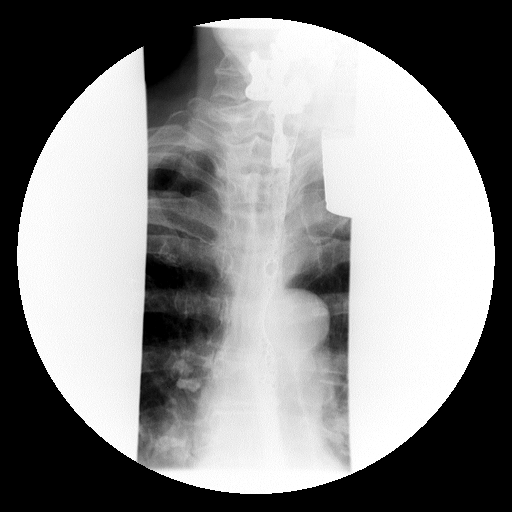

[Series 2: run · 1 of 1 slices shown (2 of 19)]
[im 1/1]
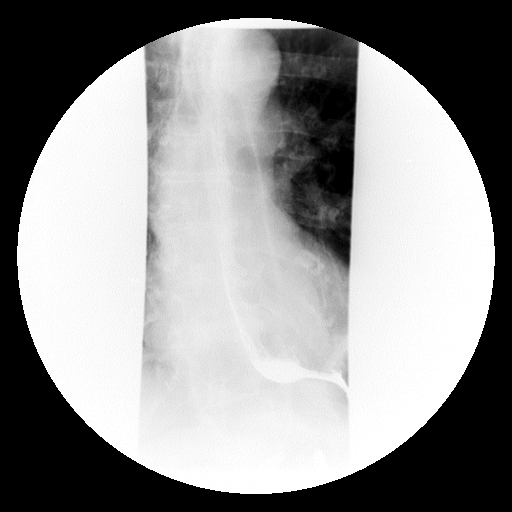

[Series 4: run · 1 of 1 slices shown (3 of 19)]
[im 1/1]
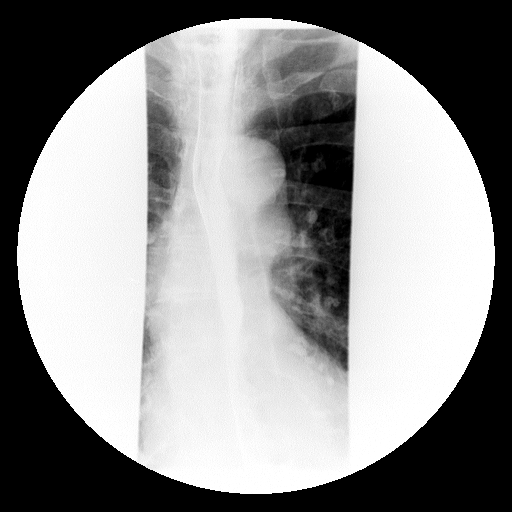

[Series 5: run · 1 of 1 slices shown (4 of 19)]
[im 1/1]
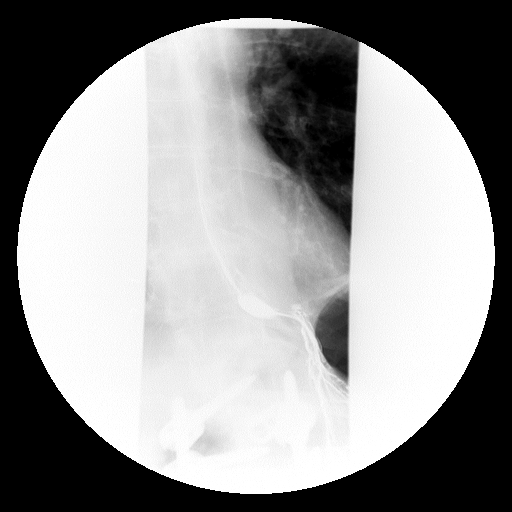

[Series 6: run · 1 of 1 slices shown (5 of 19)]
[im 1/1]
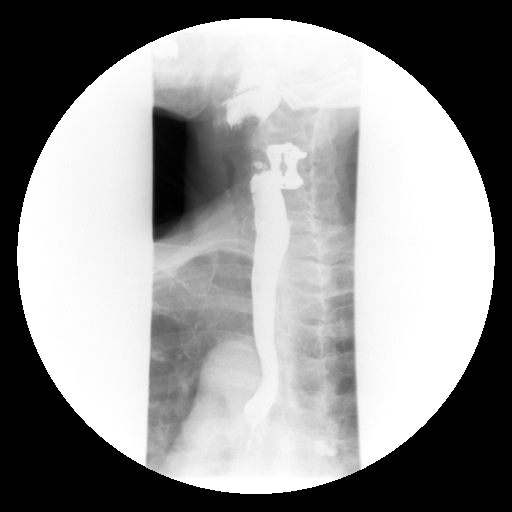

[Series 7: run · 1 of 1 slices shown (6 of 19)]
[im 1/1]
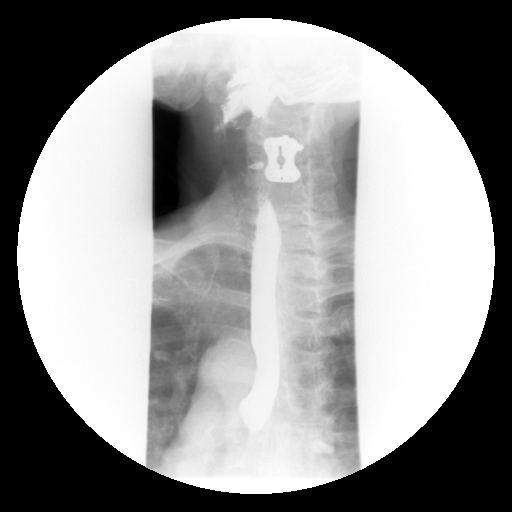

[Series 9: run · 1 of 1 slices shown (7 of 19)]
[im 1/1]
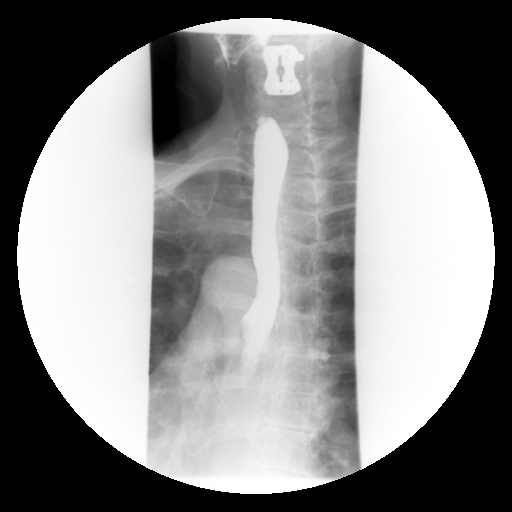

[Series 10: run · 1 of 1 slices shown (8 of 19)]
[im 1/1]
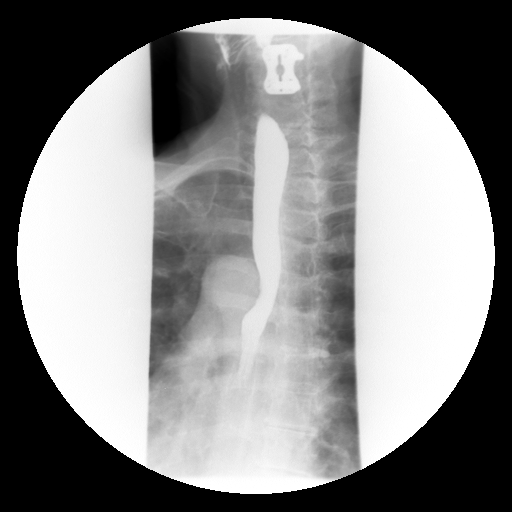

[Series 11: run · 1 of 1 slices shown (9 of 19)]
[im 1/1]
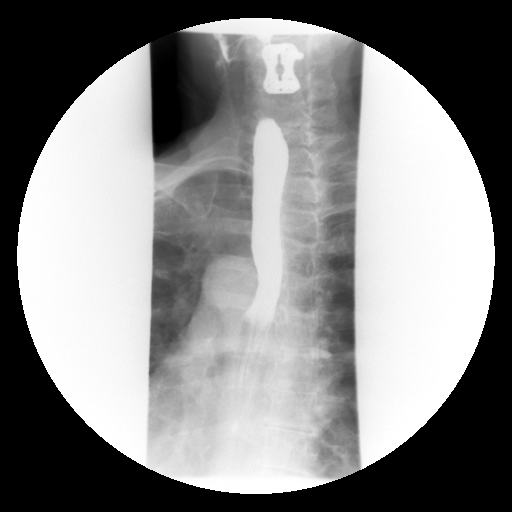

[Series 13: run · 1 of 1 slices shown (10 of 19)]
[im 1/1]
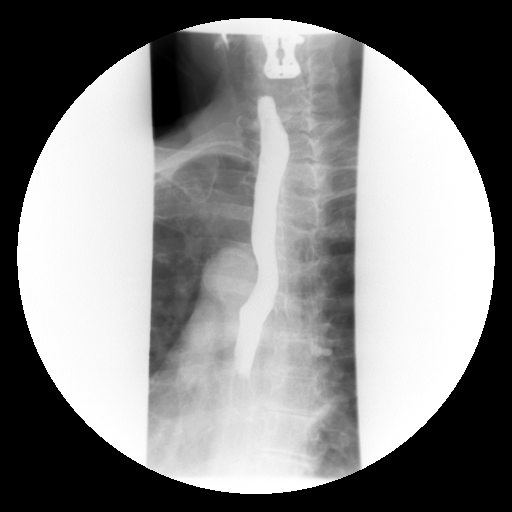

[Series 14: run · 1 of 1 slices shown (11 of 19)]
[im 1/1]
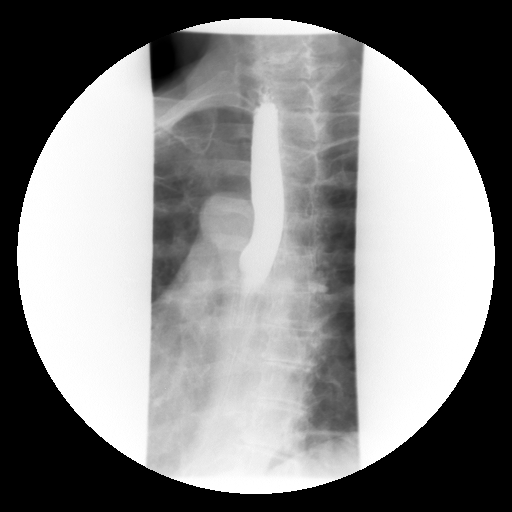

[Series 15: run · 1 of 1 slices shown (12 of 19)]
[im 1/1]
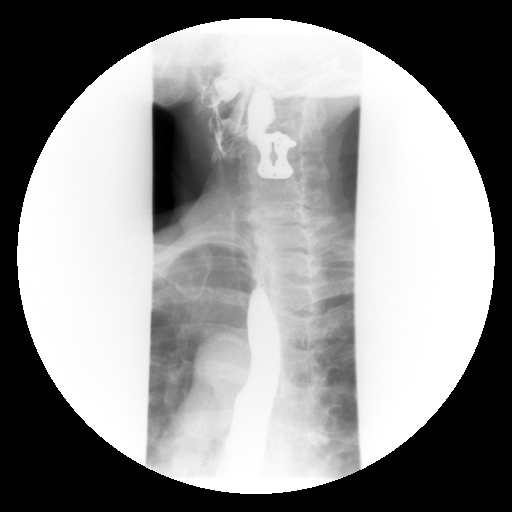

[Series 16: run · 1 of 1 slices shown (13 of 19)]
[im 1/1]
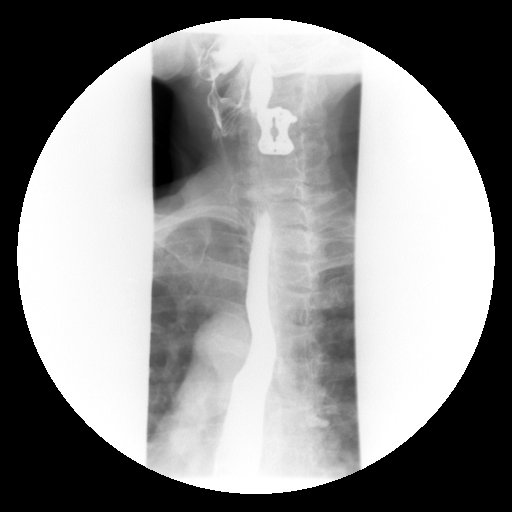

[Series 18: run · 1 of 1 slices shown (14 of 19)]
[im 1/1]
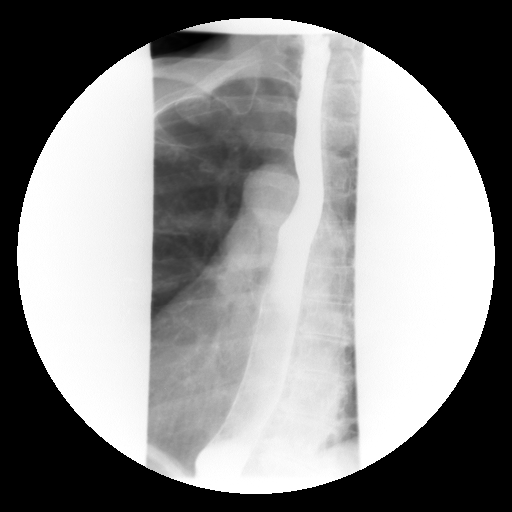

[Series 19: run · 1 of 1 slices shown (15 of 19)]
[im 1/1]
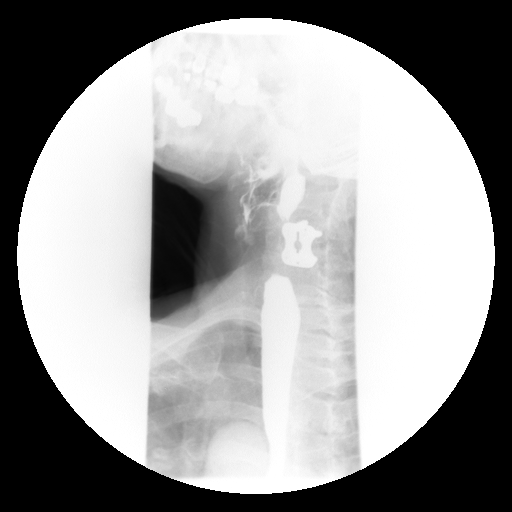

[Series 20: run · 1 of 1 slices shown (16 of 19)]
[im 1/1]
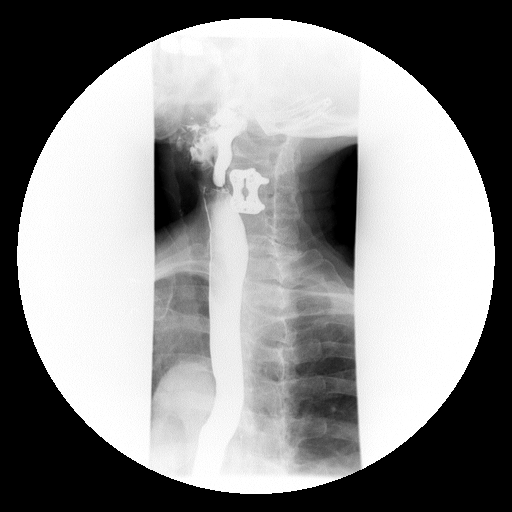

[Series 21: run · 1 of 1 slices shown (17 of 19)]
[im 1/1]
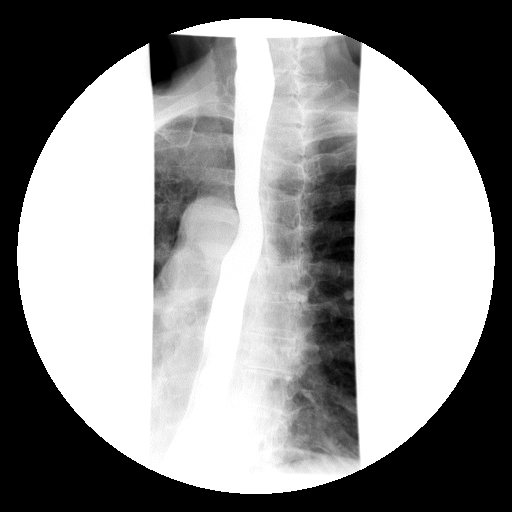

[Series 23: run · 1 of 1 slices shown (18 of 19)]
[im 1/1]
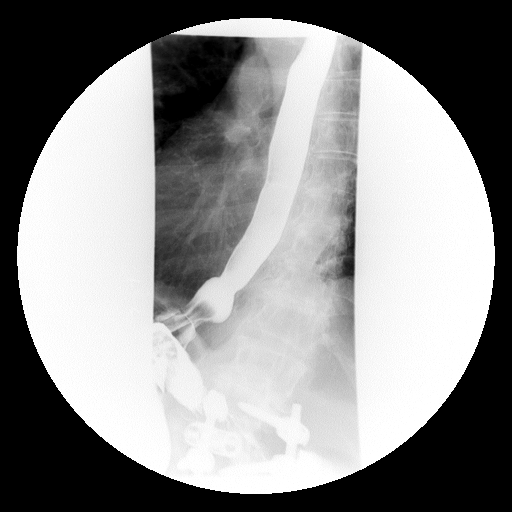

[Series 24: run · 1 of 1 slices shown (19 of 19)]
[im 1/1]
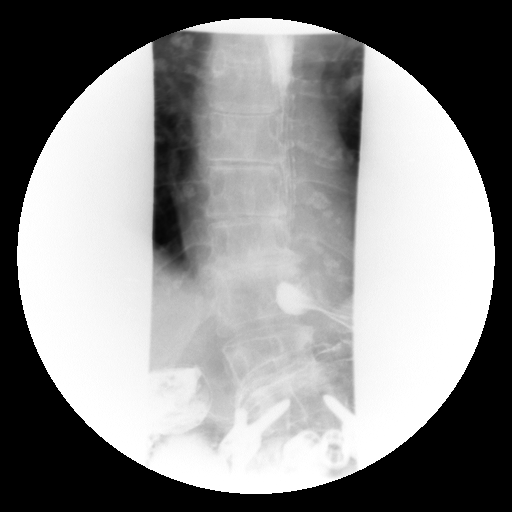

[19 of 24 positions shown; findings below may reference images not displayed]

FINDINGS: Initial double contrast images of the esophagus demonstrated a
normal appearance of the esophageal mucosa. Multiple single swallow
attempts were observed, which demonstrated failure to propagate any
of the primary peristaltic waves. Tertiary contractions were not
observed during examination. Notably, in the prone RAO position, the
larynx was clearly outlined by barium throughout this portion of the
examination, without observation of frank aspiration. Full column
esophagram demonstrated no evidence of esophageal mass, stricture or
esophageal ring. A very small hiatal hernia was noted. Water siphon
test demonstrated gastroesophageal reflux. A barium tablet was
administered, which passed readily into the stomach.
IMPRESSION: 1. Nonspecific esophageal motility disorder.
2. Small hiatal hernia with gastroesophageal reflux.
3. Barium outlined the larynx during portions of the examination
(prone imaging). This raises concern for potential risk of
aspiration, although no aspiration was observed during today's
examination. Followup evaluation with formal speech therapy swallow
study is strongly recommended in the near future.

## 2017-06-23 ENCOUNTER — Ambulatory Visit
Admission: RE | Admit: 2017-06-23 | Discharge: 2017-06-23 | Disposition: A | Discharge status: Home / Self Care | Payer: Medicare Other | Source: Ambulatory Visit | Attending: Neurosurgery | Admitting: Neurosurgery

## 2017-06-23 DIAGNOSIS — M5412 Radiculopathy, cervical region: Secondary | ICD-10-CM

## 2017-06-23 MED ORDER — IOPAMIDOL (ISOVUE-M 300) INJECTION 61%
1.0000 mL | Freq: Once | INTRAMUSCULAR | Status: AC | PRN
  Administered 2017-06-23: 1 mL via EPIDURAL

## 2017-06-23 MED ORDER — TRIAMCINOLONE ACETONIDE 40 MG/ML IJ SUSP (RADIOLOGY)
60.0000 mg | Freq: Once | INTRAMUSCULAR | Status: AC
  Administered 2017-06-23: 60 mg via EPIDURAL

## 2017-06-23 NOTE — Discharge Instructions (Signed)

## 2017-07-01 NOTE — Progress Notes (Signed)
Please place orders in EPIC as patient is being scheduled for a pre-op appointment! Thank you! 

## 2017-07-05 ENCOUNTER — Encounter (HOSPITAL_COMMUNITY): Payer: Self-pay

## 2017-07-05 NOTE — Patient Instructions (Signed)
Sara Davila  07/05/2017   Your procedure is scheduled on: 07/09/17  Report to [facility] Main  Entrance Take [place]  elevators to 3rd floor to  [place] at    [phone].   Call this number if you have problems the morning of surgery [phone]    Remember: ONLY 1 PERSON MAY GO WITH YOU TO SHORT STAY TO GET  READY MORNING OF [place].  Do not eat food or drink liquids :After Midnight.     Take these medicines the morning of surgery with A SIP OF WATER: levothyroxine, eye drops as usual                                You may not have any metal on your body including hair pins and              piercings  Do not wear jewelry, make-up, lotions, powders or perfumes, deodorant             Do not wear nail polish.  Do not shave  48 hours prior to surgery.               Do not bring valuables to the hospital. [place].  Contacts, dentures or bridgework may not be worn into surgery.  Leave suitcase in the car. After surgery it may be brought to your room.     Patients discharged the day of surgery will not be allowed to drive home.  Name and phone number of your driver:  Special Instructions: N/A              Please read over the following fact sheets you were given: _____________________________________________________________________             [facility] - Preparing for Surgery Before surgery, you can play an important role.  Because skin is not sterile, your skin needs to be as free of germs as possible.  You can reduce the number of germs on your skin by washing with CHG (chlorahexidine gluconate) soap before surgery.  CHG is an antiseptic cleaner which kills germs and bonds with the skin to continue killing germs even after washing. Please DO NOT use if you have an allergy to CHG or antibacterial soaps.  If your skin becomes reddened/irritated stop using the CHG and inform your nurse when you  arrive at Short Stay. Do not shave (including legs and underarms) for at least 48 hours prior to the first CHG shower.  You may shave your face/neck. Please follow these instructions carefully:  1.  Shower with CHG Soap the night before surgery and the  morning of Surgery.  2.  If you choose to wash your hair, wash your hair first as usual with your  normal  shampoo.  3.  After you shampoo, rinse your hair and body thoroughly to remove the  shampoo.                           4.  Use CHG as you would any other liquid soap.  You can apply chg directly  to the skin and wash  Gently with a scrungie or clean washcloth.  5.  Apply the CHG Soap to your body ONLY FROM THE NECK DOWN.   Do not use on face/ open                           Wound or open sores. Avoid contact with eyes, ears mouth and genitals (private parts).                       Wash face,  Genitals (private parts) with your normal soap.             6.  Wash thoroughly, paying special attention to the area where your surgery  will be performed.  7.  Thoroughly rinse your body with warm water from the neck down.  8.  DO NOT shower/wash with your normal soap after using and rinsing off  the CHG Soap.                9.  Pat yourself dry with a clean towel.            10.  Wear clean pajamas.            11.  Place clean sheets on your bed the night of your first shower and do not  sleep with pets. Day of Surgery : Do not apply any lotions/deodorants the morning of surgery.  Please wear clean clothes to the hospital/surgery center.  FAILURE TO FOLLOW THESE INSTRUCTIONS MAY RESULT IN THE CANCELLATION OF YOUR SURGERY PATIENT SIGNATURE_________________________________  NURSE SIGNATURE__________________________________  ________________________________________________________________________

## 2017-07-05 NOTE — Progress Notes (Addendum)
Please place orders in epic pt. Has preop   [facility] 8/7 at 1 pm. Thank you

## 2017-07-06 ENCOUNTER — Encounter (HOSPITAL_COMMUNITY): Payer: Self-pay

## 2017-07-06 ENCOUNTER — Encounter (HOSPITAL_COMMUNITY)
Admission: RE | Admit: 2017-07-06 | Discharge: 2017-07-06 | Disposition: A | Discharge status: Home / Self Care | Payer: Medicare Other | Source: Ambulatory Visit | Attending: Surgery | Admitting: Surgery

## 2017-07-06 DIAGNOSIS — K668 Other specified disorders of peritoneum: Secondary | ICD-10-CM | POA: Insufficient documentation

## 2017-07-06 DIAGNOSIS — Z01812 Encounter for preprocedural laboratory examination: Secondary | ICD-10-CM | POA: Insufficient documentation

## 2017-07-06 DIAGNOSIS — Z0183 Encounter for blood typing: Secondary | ICD-10-CM | POA: Insufficient documentation

## 2017-07-06 HISTORY — DX: Hypothyroidism, unspecified: E03.9

## 2017-07-06 HISTORY — DX: Anxiety disorder, unspecified: F41.9

## 2017-07-06 HISTORY — DX: Gastro-esophageal reflux disease without esophagitis: K21.9

## 2017-07-06 HISTORY — DX: Unspecified osteoarthritis, unspecified site: M19.90

## 2017-07-06 HISTORY — DX: Pneumonia, unspecified organism: J18.9

## 2017-07-06 LAB — CBC
HCT: 35.8 % — ABNORMAL LOW (ref 36.0–46.0)
Hemoglobin: 12.6 g/dL (ref 12.0–15.0)
MCH: 32.3 pg (ref 26.0–34.0)
MCHC: 35.2 g/dL (ref 30.0–36.0)
MCV: 91.8 fL (ref 78.0–100.0)
Platelets: 509 10*3/uL — ABNORMAL HIGH (ref 150–400)
RBC: 3.9 MIL/uL (ref 3.87–5.11)
RDW: 13.8 % (ref 11.5–15.5)
WBC: 8.5 10*3/uL (ref 4.0–10.5)

## 2017-07-06 LAB — BASIC METABOLIC PANEL
Anion gap: 10 (ref 5–15)
BUN: 13 mg/dL (ref 6–20)
CO2: 25 mmol/L (ref 22–32)
Calcium: 9.3 mg/dL (ref 8.9–10.3)
Chloride: 94 mmol/L — ABNORMAL LOW (ref 101–111)
Creatinine, Ser: 0.76 mg/dL (ref 0.44–1.00)
GFR calc Af Amer: >60 mL/min (ref >60)
GFR calc non Af Amer: >60 mL/min (ref >60)
Glucose, Bld: 98 mg/dL (ref 65–99)
Potassium: 4.6 mmol/L (ref 3.5–5.1)
Sodium: 129 mmol/L — ABNORMAL LOW (ref 135–145)

## 2017-07-06 LAB — ABO/RH: ABO/RH(D): A NEG

## 2017-07-06 NOTE — Progress Notes (Signed)
Cbc/bmp done 07/06/17 routed to Dr. Hassell Done via epic.

## 2017-07-07 ENCOUNTER — Ambulatory Visit: Payer: Self-pay | Admitting: Surgery

## 2017-07-08 NOTE — H&P (Signed)
Sara Davila 05/26/2017 10:00 AM Location: [facility] Patient #: [id number] DOB: 09-11-1930 Widowed / Language: Sara Davila / Race: White Female   History of Present Illness Rodman Key B. Hassell Done MD; 05/26/2017 10:41 AM) The patient is a 83 year old female who presents with non-malignant abdominal pain. I am ready to die-I figure I will perforate. The pain meds that I gave her have made things a little better. She would like to try either resection or plication of her diverticula of her small bowel. This will need to be done with a laparotomy but will go in with laparoscope first. I have explained that resection of bowel or plication may not make things better but at this point she is ready to try. She stays hungry all of the time but eats and gets bloating and pain.    Problem List/Past Medical Malachy Moan, [place]; 05/26/2017 10:02 AM) DIVERTICULITIS OF SMALL INTESTINE WITHOUT PERFORATION OR ABSCESS WITHOUT BLEEDING (K57.12)  Make an appointment to see Dr. Amedeo Plenty  Allergies Malachy Moan, RMA; 05/26/2017 10:02 AM) Penicillin G Sodium *PENICILLINS*  Codeine Sulfate *ANALGESICS - OPIOID*   Medication History Malachy Moan, RMA; 05/26/2017 10:03 AM) TraMADol HCl (50MG  Tablet, 1 (one) Tablet Oral every six hours, as needed, Taken starting 03/24/2017) Active. Omeprazole (20MG  Capsule DR, Oral) Active. Hair Skin Nails (Oral) Active. Garlic (100MG  Tablet, Oral) Active. Cranberry (1000MG  Capsule, Oral) Active. Ocuvite Extra (Oral) Active. Calcium Citrate-Vitamin D (500-400MG -UNIT Tablet Chewable, Oral) Active. Probiotic Daily (Oral) Active. Levothyroxine Sodium (75MCG Tablet, Oral) Active. LORazepam (0.5MG  Tablet, Oral) Active. Ambien (10MG  Tablet, Oral) Active. Vitamin B12 (1000MCG Tablet ER, Oral) Active. Vitamin D3 (1000UNIT Capsule, Oral) Active. Medications Reconciled  Vitals Malachy Moan RMA; 05/26/2017 10:03 AM) 05/26/2017 10:03 AM Weight: 90  lb Height: 60in Body Surface Area: 1.33 m Body Mass Index: 17.58 kg/m  Temp.: 97.50F  Pulse: 89 (Regular)  BP: 124/72 (Sitting, Left Arm, Standard)       Physical Exam (Johnwesley Lederman B. Hassell Done MD; 05/26/2017 10:40 AM) General Note: Alert and spry but thin WF NAD HEENT unremarkable Neck supple Chest clear Heart SR without murmurs Abdomen no acute pain Ext FROM     Assessment & Plan Rodman Key B. Hassell Done MD; 05/26/2017 10:42 AM) DIVERTICULITIS OF SMALL INTESTINE WITHOUT PERFORATION OR ABSCESS WITHOUT BLEEDING (K57.12) Impression: Will schedule laparoscopy and laparotomy at [facility]

## 2017-07-09 ENCOUNTER — Encounter (HOSPITAL_COMMUNITY)
Admission: RE | Disposition: A | Discharge status: Skilled Nursing Facility | Payer: Self-pay | Source: Ambulatory Visit | Attending: Surgery

## 2017-07-09 ENCOUNTER — Inpatient Hospital Stay (HOSPITAL_COMMUNITY): Payer: Medicare Other | Admitting: Registered Nurse

## 2017-07-09 ENCOUNTER — Inpatient Hospital Stay (HOSPITAL_COMMUNITY)
Admission: RE | Admit: 2017-07-09 | Discharge: 2017-07-14 | DRG: 330 | Disposition: A | Discharge status: Skilled Nursing Facility | Payer: Medicare Other | Source: Ambulatory Visit | Attending: Surgery | Admitting: Surgery

## 2017-07-09 ENCOUNTER — Encounter (HOSPITAL_COMMUNITY): Payer: Self-pay | Admitting: *Deleted

## 2017-07-09 DIAGNOSIS — K5712 Diverticulitis of small intestine without perforation or abscess without bleeding: Principal | ICD-10-CM | POA: Diagnosis present

## 2017-07-09 DIAGNOSIS — E039 Hypothyroidism, unspecified: Secondary | ICD-10-CM | POA: Diagnosis not present

## 2017-07-09 DIAGNOSIS — Z885 Allergy status to narcotic agent status: Secondary | ICD-10-CM

## 2017-07-09 DIAGNOSIS — Z87891 Personal history of nicotine dependence: Secondary | ICD-10-CM | POA: Diagnosis not present

## 2017-07-09 DIAGNOSIS — Z880 Allergy status to penicillin: Secondary | ICD-10-CM

## 2017-07-09 DIAGNOSIS — Z9049 Acquired absence of other specified parts of digestive tract: Secondary | ICD-10-CM

## 2017-07-09 DIAGNOSIS — E440 Moderate protein-calorie malnutrition: Secondary | ICD-10-CM | POA: Diagnosis not present

## 2017-07-09 DIAGNOSIS — Z681 Body mass index (BMI) 19 or less, adult: Secondary | ICD-10-CM

## 2017-07-09 DIAGNOSIS — Z79899 Other long term (current) drug therapy: Secondary | ICD-10-CM | POA: Diagnosis not present

## 2017-07-09 DIAGNOSIS — M797 Fibromyalgia: Secondary | ICD-10-CM | POA: Diagnosis not present

## 2017-07-09 DIAGNOSIS — K219 Gastro-esophageal reflux disease without esophagitis: Secondary | ICD-10-CM | POA: Diagnosis present

## 2017-07-09 HISTORY — PX: BOWEL RESECTION: SHX1257

## 2017-07-09 HISTORY — PX: LAPAROTOMY: SHX154

## 2017-07-09 LAB — CREATININE, SERUM
Creatinine, Ser: 0.72 mg/dL (ref 0.44–1.00)
GFR calc Af Amer: >60 mL/min (ref >60)
GFR calc non Af Amer: >60 mL/min (ref >60)

## 2017-07-09 LAB — CBC
HCT: 38 % (ref 36.0–46.0)
Hemoglobin: 13.1 g/dL (ref 12.0–15.0)
MCH: 31.9 pg (ref 26.0–34.0)
MCHC: 34.5 g/dL (ref 30.0–36.0)
MCV: 92.5 fL (ref 78.0–100.0)
Platelets: 446 10*3/uL — ABNORMAL HIGH (ref 150–400)
RBC: 4.11 MIL/uL (ref 3.87–5.11)
RDW: 13.9 % (ref 11.5–15.5)
WBC: 2.5 10*3/uL — ABNORMAL LOW (ref 4.0–10.5)

## 2017-07-09 LAB — TYPE AND SCREEN
ABO/RH(D): A NEG
Antibody Screen: NEGATIVE

## 2017-07-09 SURGERY — LAPAROTOMY, EXPLORATORY
Anesthesia: General | Site: Abdomen

## 2017-07-09 MED ORDER — 0.9 % SODIUM CHLORIDE (POUR BTL) OPTIME
TOPICAL | Status: DC | PRN
  Administered 2017-07-09: 4000 mL

## 2017-07-09 MED ORDER — ONDANSETRON HCL 4 MG/2ML IJ SOLN: 4.0000 mg | Freq: Four times a day (QID) | INTRAMUSCULAR | Status: DC | PRN

## 2017-07-09 MED ORDER — PANTOPRAZOLE SODIUM 40 MG IV SOLR
40.0000 mg | Freq: Every day | INTRAVENOUS | Status: DC
  Administered 2017-07-09 – 2017-07-11 (×3): 40 mg via INTRAVENOUS
  Filled 2017-07-09 (×3): qty 40

## 2017-07-09 MED ORDER — KETAMINE HCL 10 MG/ML IJ SOLN
INTRAMUSCULAR | Status: DC | PRN
  Administered 2017-07-09: 10 mg via INTRAVENOUS

## 2017-07-09 MED ORDER — ONDANSETRON HCL 4 MG/2ML IJ SOLN
INTRAMUSCULAR | Status: DC | PRN
  Administered 2017-07-09: 4 mg via INTRAVENOUS

## 2017-07-09 MED ORDER — ONDANSETRON HCL 4 MG/2ML IJ SOLN: 4.0000 mg | Freq: Once | INTRAMUSCULAR | Status: DC | PRN

## 2017-07-09 MED ORDER — PROPOFOL 10 MG/ML IV BOLUS
INTRAVENOUS | Status: DC | PRN
  Administered 2017-07-09: 60 mg via INTRAVENOUS

## 2017-07-09 MED ORDER — GABAPENTIN 300 MG PO CAPS
300.0000 mg | ORAL_CAPSULE | ORAL | Status: AC
  Administered 2017-07-09: 300 mg via ORAL
  Filled 2017-07-09: qty 1

## 2017-07-09 MED ORDER — ROCURONIUM BROMIDE 100 MG/10ML IV SOLN
INTRAVENOUS | Status: DC | PRN
  Administered 2017-07-09: 40 mg via INTRAVENOUS
  Administered 2017-07-09: 10 mg via INTRAVENOUS
  Administered 2017-07-09: 5 mg via INTRAVENOUS

## 2017-07-09 MED ORDER — FENTANYL CITRATE (PF) 100 MCG/2ML IJ SOLN
12.5000 ug | INTRAMUSCULAR | Status: DC | PRN
  Administered 2017-07-09 – 2017-07-10 (×4): 12.5 ug via INTRAVENOUS
  Filled 2017-07-09 (×4): qty 2

## 2017-07-09 MED ORDER — PROPOFOL 10 MG/ML IV BOLUS
INTRAVENOUS | Status: AC
  Filled 2017-07-09: qty 20

## 2017-07-09 MED ORDER — ROCURONIUM BROMIDE 50 MG/5ML IV SOSY
PREFILLED_SYRINGE | INTRAVENOUS | Status: AC
  Filled 2017-07-09: qty 5

## 2017-07-09 MED ORDER — LIDOCAINE 2% (20 MG/ML) 5 ML SYRINGE
INTRAMUSCULAR | Status: AC
  Filled 2017-07-09: qty 5

## 2017-07-09 MED ORDER — KCL IN DEXTROSE-NACL 20-5-0.45 MEQ/L-%-% IV SOLN
INTRAVENOUS | Status: DC
  Administered 2017-07-09 – 2017-07-12 (×5): via INTRAVENOUS
  Filled 2017-07-09 (×7): qty 1000

## 2017-07-09 MED ORDER — HEPARIN SODIUM (PORCINE) 5000 UNIT/ML IJ SOLN
5000.0000 [IU] | Freq: Three times a day (TID) | INTRAMUSCULAR | Status: DC
  Administered 2017-07-09 – 2017-07-14 (×14): 5000 [IU] via SUBCUTANEOUS
  Filled 2017-07-09 (×14): qty 1

## 2017-07-09 MED ORDER — SUCCINYLCHOLINE CHLORIDE 200 MG/10ML IV SOSY
PREFILLED_SYRINGE | INTRAVENOUS | Status: AC
  Filled 2017-07-09: qty 10

## 2017-07-09 MED ORDER — HEPARIN SODIUM (PORCINE) 5000 UNIT/ML IJ SOLN
5000.0000 [IU] | Freq: Once | INTRAMUSCULAR | Status: AC
  Administered 2017-07-09: 5000 [IU] via SUBCUTANEOUS
  Filled 2017-07-09: qty 1

## 2017-07-09 MED ORDER — DEXAMETHASONE SODIUM PHOSPHATE 10 MG/ML IJ SOLN
INTRAMUSCULAR | Status: DC | PRN
  Administered 2017-07-09: 4 mg via INTRAVENOUS

## 2017-07-09 MED ORDER — FENTANYL CITRATE (PF) 100 MCG/2ML IJ SOLN
25.0000 ug | INTRAMUSCULAR | Status: DC | PRN
  Administered 2017-07-09 (×2): 25 ug via INTRAVENOUS

## 2017-07-09 MED ORDER — SODIUM CHLORIDE 0.9 % IV SOLN
INTRAVENOUS | Status: DC
  Administered 2017-07-09: 09:00:00 via INTRAVENOUS

## 2017-07-09 MED ORDER — ONDANSETRON HCL 4 MG/2ML IJ SOLN
INTRAMUSCULAR | Status: AC
  Filled 2017-07-09: qty 2

## 2017-07-09 MED ORDER — PHENYLEPHRINE HCL 10 MG/ML IJ SOLN
INTRAMUSCULAR | Status: DC | PRN
  Administered 2017-07-09 (×5): 40 ug via INTRAVENOUS

## 2017-07-09 MED ORDER — ONDANSETRON 4 MG PO TBDP: 4.0000 mg | ORAL_TABLET | Freq: Four times a day (QID) | ORAL | Status: DC | PRN

## 2017-07-09 MED ORDER — SUGAMMADEX SODIUM 200 MG/2ML IV SOLN
INTRAVENOUS | Status: DC | PRN
  Administered 2017-07-09: 80 mg via INTRAVENOUS

## 2017-07-09 MED ORDER — FENTANYL CITRATE (PF) 250 MCG/5ML IJ SOLN
INTRAMUSCULAR | Status: AC
Start: 2017-07-09 — End: ?
  Filled 2017-07-09: qty 5

## 2017-07-09 MED ORDER — FENTANYL CITRATE (PF) 100 MCG/2ML IJ SOLN
25.0000 ug | INTRAMUSCULAR | Status: DC | PRN
  Administered 2017-07-09: 50 ug via INTRAVENOUS

## 2017-07-09 MED ORDER — FENTANYL CITRATE (PF) 100 MCG/2ML IJ SOLN
INTRAMUSCULAR | Status: AC
  Filled 2017-07-09: qty 4

## 2017-07-09 MED ORDER — BUPIVACAINE LIPOSOME 1.3 % IJ SUSP
20.0000 mL | Freq: Once | INTRAMUSCULAR | Status: AC
  Administered 2017-07-09: 20 mL
  Filled 2017-07-09: qty 20

## 2017-07-09 MED ORDER — DEXAMETHASONE SODIUM PHOSPHATE 10 MG/ML IJ SOLN
INTRAMUSCULAR | Status: AC
  Filled 2017-07-09: qty 1

## 2017-07-09 MED ORDER — LIDOCAINE 2% (20 MG/ML) 5 ML SYRINGE
INTRAMUSCULAR | Status: DC | PRN
  Administered 2017-07-09: 1 mg/kg/h via INTRAVENOUS

## 2017-07-09 MED ORDER — MIDAZOLAM HCL 2 MG/2ML IJ SOLN
INTRAMUSCULAR | Status: AC
  Filled 2017-07-09: qty 2

## 2017-07-09 MED ORDER — EPHEDRINE 5 MG/ML INJ
INTRAVENOUS | Status: AC
  Filled 2017-07-09: qty 10

## 2017-07-09 MED ORDER — BUPIVACAINE-EPINEPHRINE (PF) 0.25% -1:200000 IJ SOLN
INTRAMUSCULAR | Status: AC
  Filled 2017-07-09: qty 30

## 2017-07-09 MED ORDER — SODIUM CHLORIDE 0.9 % IJ SOLN
INTRAMUSCULAR | Status: AC
  Filled 2017-07-09: qty 10

## 2017-07-09 MED ORDER — KETAMINE HCL 10 MG/ML IJ SOLN
INTRAMUSCULAR | Status: AC
  Filled 2017-07-09: qty 1

## 2017-07-09 MED ORDER — ACETAMINOPHEN 500 MG PO TABS
1000.0000 mg | ORAL_TABLET | ORAL | Status: AC
  Administered 2017-07-09: 1000 mg via ORAL
  Filled 2017-07-09: qty 2

## 2017-07-09 MED ORDER — CEFOTETAN DISODIUM-DEXTROSE 2-2.08 GM-% IV SOLR
2.0000 g | INTRAVENOUS | Status: AC
  Administered 2017-07-09: 2 g via INTRAVENOUS
  Filled 2017-07-09: qty 50

## 2017-07-09 MED ORDER — FENTANYL CITRATE (PF) 100 MCG/2ML IJ SOLN
INTRAMUSCULAR | Status: DC | PRN
  Administered 2017-07-09 (×3): 25 ug via INTRAVENOUS
  Administered 2017-07-09: 50 ug via INTRAVENOUS
  Administered 2017-07-09: 25 ug via INTRAVENOUS
  Administered 2017-07-09: 50 ug via INTRAVENOUS

## 2017-07-09 MED ORDER — CELECOXIB 200 MG PO CAPS
400.0000 mg | ORAL_CAPSULE | ORAL | Status: AC
  Administered 2017-07-09: 400 mg via ORAL
  Filled 2017-07-09: qty 2

## 2017-07-09 MED ORDER — LIDOCAINE HCL (CARDIAC) 20 MG/ML IV SOLN
INTRAVENOUS | Status: DC | PRN
  Administered 2017-07-09: 40 mg via INTRAVENOUS
  Administered 2017-07-09: 10 mg via INTRATRACHEAL

## 2017-07-09 MED ORDER — LACTATED RINGERS IV SOLN
INTRAVENOUS | Status: DC | PRN
  Administered 2017-07-09: 11:00:00 via INTRAVENOUS
  Administered 2017-07-09: 1000 mL
  Administered 2017-07-09 (×2): via INTRAVENOUS

## 2017-07-09 SURGICAL SUPPLY — 72 items
APPLICATOR COTTON TIP 6IN STRL (MISCELLANEOUS) ×4 IMPLANT
BLADE EXTENDED COATED 6.5IN (ELECTRODE) IMPLANT
BLADE HEX COATED 2.75 (ELECTRODE) ×4 IMPLANT
CABLE HIGH FREQUENCY MONO STRZ (ELECTRODE) ×4 IMPLANT
CHLORAPREP W/TINT 26ML (MISCELLANEOUS) IMPLANT
COUNTER NEEDLE 20 DBL MAG RED (NEEDLE) ×4 IMPLANT
COVER MAYO STAND STRL (DRAPES) ×12 IMPLANT
COVER SURGICAL LIGHT HANDLE (MISCELLANEOUS) ×4 IMPLANT
DECANTER SPIKE VIAL GLASS SM (MISCELLANEOUS) IMPLANT
DRAIN CHANNEL 19F RND (DRAIN) IMPLANT
DRAPE LAPAROSCOPIC ABDOMINAL (DRAPES) ×4 IMPLANT
DRAPE WARM FLUID 44X44 (DRAPE) ×4 IMPLANT
DRSG OPSITE POSTOP 4X10 (GAUZE/BANDAGES/DRESSINGS) IMPLANT
DRSG OPSITE POSTOP 4X6 (GAUZE/BANDAGES/DRESSINGS) ×3 IMPLANT
DRSG OPSITE POSTOP 4X8 (GAUZE/BANDAGES/DRESSINGS) IMPLANT
ELECT PENCIL ROCKER SW 15FT (MISCELLANEOUS) ×8 IMPLANT
ELECT REM PT RETURN 15FT ADLT (MISCELLANEOUS) ×4 IMPLANT
EVACUATOR SILICONE 100CC (DRAIN) IMPLANT
GAUZE SPONGE 4X4 12PLY STRL (GAUZE/BANDAGES/DRESSINGS) ×4 IMPLANT
GLOVE BIOGEL M 8.0 STRL (GLOVE) ×8 IMPLANT
GOWN SPEC L4 XLG W/TWL (GOWN DISPOSABLE) ×4 IMPLANT
GOWN STRL REUS W/TWL XL LVL3 (GOWN DISPOSABLE) ×16 IMPLANT
HANDLE STAPLE EGIA 4 XL (STAPLE) ×3 IMPLANT
HANDLE SUCTION POOLE (INSTRUMENTS) IMPLANT
IRRIG SUCT STRYKERFLOW 2 WTIP (MISCELLANEOUS) ×4
IRRIGATION SUCT STRKRFLW 2 WTP (MISCELLANEOUS) ×2 IMPLANT
KIT BASIN OR (CUSTOM PROCEDURE TRAY) ×4 IMPLANT
LEGGING LITHOTOMY PAIR STRL (DRAPES) IMPLANT
LIGASURE IMPACT 36 18CM CVD LR (INSTRUMENTS) ×3 IMPLANT
NS IRRIG 1000ML POUR BTL (IV SOLUTION) ×8 IMPLANT
PACK COLON (CUSTOM PROCEDURE TRAY) ×4 IMPLANT
PACK GENERAL/GYN (CUSTOM PROCEDURE TRAY) ×4 IMPLANT
RELOAD EGIA 60 TAN VASC (STAPLE) ×9 IMPLANT
RELOAD STAPLE 45 PURP MED/THCK (STAPLE) IMPLANT
RELOAD TRI 45 ART MED THCK BLK (STAPLE) IMPLANT
RELOAD TRI 45 ART MED THCK PUR (STAPLE) IMPLANT
RELOAD TRI 60 ART MED THCK BLK (STAPLE) IMPLANT
RELOAD TRI 60 ART MED THCK PUR (STAPLE) IMPLANT
SCISSORS LAP 5X45 EPIX DISP (ENDOMECHANICALS) ×4 IMPLANT
SEALER TISSUE X1 CVD JAW (INSTRUMENTS) IMPLANT
SHEARS HARMONIC ACE PLUS 45CM (MISCELLANEOUS) IMPLANT
SLEEVE ADV FIXATION 5X100MM (TROCAR) ×4 IMPLANT
SPONGE DRAIN TRACH 4X4 STRL 2S (GAUZE/BANDAGES/DRESSINGS) IMPLANT
SPONGE LAP 18X18 X RAY DECT (DISPOSABLE) ×8 IMPLANT
STAPLER VISISTAT 35W (STAPLE) ×4 IMPLANT
SUCTION POOLE HANDLE (INSTRUMENTS)
SUT ETHILON 3 0 PS 1 (SUTURE) IMPLANT
SUT MNCRL AB 4-0 PS2 18 (SUTURE) IMPLANT
SUT NOVA 1 T20/GS 25DT (SUTURE) IMPLANT
SUT NOVA NAB DX-16 0-1 5-0 T12 (SUTURE) ×9 IMPLANT
SUT PDS AB 1 CTX 36 (SUTURE) IMPLANT
SUT PDS AB 1 TP1 96 (SUTURE) IMPLANT
SUT PDS AB 4-0 SH 27 (SUTURE) ×6 IMPLANT
SUT SILK 2 0 (SUTURE) ×4
SUT SILK 2 0 SH CR/8 (SUTURE) ×4 IMPLANT
SUT SILK 2-0 18XBRD TIE 12 (SUTURE) ×2 IMPLANT
SUT SILK 3 0 (SUTURE) ×4
SUT SILK 3 0 SH CR/8 (SUTURE) ×7 IMPLANT
SUT SILK 3-0 18XBRD TIE 12 (SUTURE) ×2 IMPLANT
SUT VIC AB 3-0 SH 18 (SUTURE) ×3 IMPLANT
SUT VIC AB 3-0 SH 27 (SUTURE) ×4
SUT VIC AB 3-0 SH 27XBRD (SUTURE) ×1 IMPLANT
SUT VICRYL 2 0 18  UND BR (SUTURE)
SUT VICRYL 2 0 18 UND BR (SUTURE) IMPLANT
SYR 10ML ECCENTRIC (SYRINGE) ×4 IMPLANT
TOWEL OR 17X26 10 PK STRL BLUE (TOWEL DISPOSABLE) ×4 IMPLANT
TOWEL OR NON WOVEN STRL DISP B (DISPOSABLE) ×4 IMPLANT
TRAY FOLEY W/METER SILVER 16FR (SET/KITS/TRAYS/PACK) ×4 IMPLANT
TROCAR ADV FIXATION 5X100MM (TROCAR) ×4 IMPLANT
TROCAR BLADELESS OPT 5 100 (ENDOMECHANICALS) ×4 IMPLANT
TROCAR XCEL 12X100 BLDLESS (ENDOMECHANICALS) IMPLANT
TUBING INSUF HEATED (TUBING) ×4 IMPLANT

## 2017-07-09 NOTE — Interval H&P Note (Signed)
History and Physical Interval Note:  07/09/2017 9:22 AM  Sara Davila  has presented today for surgery, with the diagnosis of pneumoperitoneum  The various methods of treatment have been discussed with the patient and family. After consideration of risks, benefits and other options for treatment, the patient has consented to  Procedure(s): LAPAROSCOPY DIAGNOSTIC (N/A) POSSIBLE EXPLORATORY LAPAROTOMY (N/A) SMALL BOWEL RESECTION (N/A) as a surgical intervention .  The patient's history has been reviewed, patient examined, no change in status, stable for surgery.  I have reviewed the patient's chart and labs.  Questions were answered to the patient's satisfaction.     Ryland Tungate B

## 2017-07-09 NOTE — Anesthesia Preprocedure Evaluation (Addendum)
Anesthesia Evaluation  Patient identified by MRN, date of birth, ID band Patient awake    Reviewed: Allergy & Precautions, NPO status , Patient's Chart, lab work & pertinent test results  History of Anesthesia Complications Negative for: history of anesthetic complications  Airway Mallampati: II   Neck ROM: Full    Dental  (+) Caps, Chipped, Dental Advisory Given   Pulmonary former smoker,    breath sounds clear to auscultation       Cardiovascular (-) anginanegative cardio ROS   Rhythm:Regular Rate:Normal     Neuro/Psych Anxiety    GI/Hepatic Neg liver ROS, GERD  Medicated and Controlled,diverticulosis   Endo/Other  Hypothyroidism   Renal/GU negative Renal ROS     Musculoskeletal  (+) Fibromyalgia -  Abdominal   Peds  Hematology negative hematology ROS (+)   Anesthesia Other Findings Hyponatremia   Reproductive/Obstetrics                            Anesthesia Physical  Anesthesia Plan  ASA: III  Anesthesia Plan: General   Post-op Pain Management:    Induction: Intravenous  PONV Risk Score and Plan: 3 and Ondansetron, Dexamethasone and Treatment may vary due to age or medical condition  Airway Management Planned: Oral ETT  Additional Equipment:   Intra-op Plan:   Post-operative Plan: Extubation in OR  Informed Consent: I have reviewed the patients History and Physical, chart, labs and discussed the procedure including the risks, benefits and alternatives for the proposed anesthesia with the patient or authorized representative who has indicated his/her understanding and acceptance.   Dental advisory given  Plan Discussed with: CRNA and Surgeon  Anesthesia Plan Comments:         Anesthesia Quick Evaluation

## 2017-07-09 NOTE — Anesthesia Procedure Notes (Signed)
Procedure Name: Intubation Date/Time: 07/09/2017 10:10 AM Performed by: Lissa Morales Pre-anesthesia Checklist: Patient identified, Emergency Drugs available, Suction available and Patient being monitored Patient Re-evaluated:Patient Re-evaluated prior to induction Oxygen Delivery Method: Circle system utilized Preoxygenation: Pre-oxygenation with 100% oxygen Induction Type: IV induction Ventilation: Mask ventilation without difficulty Laryngoscope Size: Miller and 3 Grade View: Grade II Tube type: Oral Tube size: 6.5 mm Number of attempts: 1 Airway Equipment and Method: Stylet and Oral airway Placement Confirmation: ETT inserted through vocal cords under direct vision,  positive ETCO2 and breath sounds checked- equal and bilateral Secured at: 20 cm Tube secured with: Tape Dental Injury: Teeth and Oropharynx as per pre-operative assessment

## 2017-07-09 NOTE — Transfer of Care (Signed)
Immediate Anesthesia Transfer of Care Note  Patient: Sara Davila  Procedure(s) Performed: Procedure(s): EXPLORATORY LAPAROTOMY (N/A) SMALL BOWEL RESECTION (N/A)  Patient Location: PACU  Anesthesia Type:General  Level of Consciousness: awake, alert  and patient cooperative  Airway & Oxygen Therapy: Patient Spontanous Breathing and Patient connected to face mask oxygen  Post-op Assessment: Report given to RN, Post -op Vital signs reviewed and stable and Patient moving all extremities X 4  Post vital signs: stable  Last Vitals:  Vitals:   07/09/17 0755 07/09/17 1224  BP: 101/65 135/70  Pulse: 80 70  Resp: 16 10  Temp: 36.4 C   SpO2: 100% 100%    Last Pain:  Vitals:   07/09/17 0823  TempSrc:   PainSc: 0-No pain      Patients Stated Pain Goal: 3 (92/92/44 [id number])  Complications: No apparent anesthesia complications

## 2017-07-09 NOTE — Op Note (Signed)
NAME:  [name], [name]                     ACCOUNT NO.:  MEDICAL RECORD NO.:  [id number]  LOCATION:                                 FACILITY:  PHYSICIAN:  Isabel Caprice. Hassell Done, MD  DATE OF BIRTH:  08/22/1930  DATE OF PROCEDURE: DATE OF DISCHARGE:                              OPERATIVE REPORT   PREOPERATIVE DIAGNOSIS:  Abdominal pain with known jejunal diverticula.  PROCEDURE:  Exploratory laparotomy with resection of jejunal diverticula and primary anastomosis.  There is at least greater than 300 cm of remaining small bowel.  SURGEON:  Isabel Caprice. Hassell Done, MD.  ASSISTANT:  Odis Hollingshead, M.D.  ANESTHESIA:  General endotracheal.  DESCRIPTION OF PROCEDURE:  Ms. Sara Davila is an 83 year old lady, taken to room [facility] at [facility] given general anesthesia.  The abdomen was prepped with TechniCare and draped sterilely.  I had previously done a laparoscopy on her and I elected at this point, to go ahead and perform a laparotomy and made a small midline incision around the umbilicus.  I carried this and entered into the abdomen without difficulty.  I initially eviscerated the abdomen going down to the ligament of Treitz and she had some very large jejunal diverticula.  I then ran this to the terminal ileum, which was free and clear and it appeared that she had a free ileum.  The more distal in the jejunum, there were some diminutive diverticula.  At this point, we measured back and the point where we would take the small bowel was at least greater than 300 cm distally and at the point, where the jejunal diverticula were very prominent when it had been resected that area, which looked to be probably about 2.5 feet of small bowel.  This was resected dividing either end with a brown load of Covidien endoscopic GIA Tri stapler.  The mesentery was divided with the LigaSure and then a functional end-to-end was created laying an Endo GIA brown load to create a functional end to end proximally.  The  common defect was closed in 2 layers with 4-0 PDS and with 4-0 silks.  The mesentery was closed with interrupted 3-0 Vicryl.  I irrigated 2 to 3 times with saline.  There was really minimal contamination.  She did have several 100 mL of straw-colored ascites and this seemed to be pooling outside of the abdomen associated with the large diverticula. When completed, the wound protector which was used, was removed and then the abdomen fascia was closed with interrupted #1 Novafil inverting the knots.  The fascia was injected with Exparel.  Wound again irrigated and closed with staples.  The patient seemed to tolerate the procedure well, was taken to the recovery room in satisfactory condition.     Isabel Caprice Hassell Done, MD     MBM/MEDQ  D:  07/09/2017  T:  07/09/2017  Job:  [id number]  cc:   Elyse Jarvis. Amedeo Plenty, M.D. Fax: [phone]

## 2017-07-09 NOTE — Interval H&P Note (Signed)
History and Physical Interval Note:  07/09/2017 9:23 AM  Sara Davila  has presented today for surgery, with the diagnosis of pneumoperitoneum  The various methods of treatment have been discussed with the patient and family. After consideration of risks, benefits and other options for treatment, the patient has consented to  Procedure(s): LAPAROSCOPY DIAGNOSTIC (N/A) POSSIBLE EXPLORATORY LAPAROTOMY (N/A) SMALL BOWEL RESECTION (N/A) as a surgical intervention .  The patient's history has been reviewed, patient examined, no change in status, stable for surgery.  I have reviewed the patient's chart and labs.  Questions were answered to the patient's satisfaction.     Vitaliy Eisenhour B

## 2017-07-09 NOTE — Brief Op Note (Signed)
07/09/2017  12:24 PM  PATIENT:  Sara Davila  83 y.o. female  PRE-OPERATIVE DIAGNOSIS:  pneumoperitoneum  POST-OPERATIVE DIAGNOSIS:  PNEUMOPERITONEUM  PROCEDURE:  Procedure(s): EXPLORATORY LAPAROTOMY (N/A) SMALL BOWEL RESECTION (N/A)  SURGEON:  Surgeon(s) and Role:    Johnathan Hausen, MD - Primary    * Jackolyn Confer, MD - Assisting  PHYSICIAN ASSISTANT:   ASSISTANTS: Jackolyn Confer, MD,FACS   ANESTHESIA:   general  EBL:  Total I/O In: 1000 [I.V.:1000] Out: 50 [Blood:50]  BLOOD ADMINISTERED:none  DRAINS: none   LOCAL MEDICATIONS USED:  BUPIVICAINE   SPECIMEN:  Source of Specimen:  jejunum  DISPOSITION OF SPECIMEN:  PATHOLOGY  COUNTS:  YES  TOURNIQUET:  * No tourniquets in log *  DICTATION: .Other Dictation: Dictation Number [id number]  PLAN OF CARE: Admit to inpatient   PATIENT DISPOSITION:  PACU - hemodynamically stable.   Delay start of Pharmacological VTE agent (>24hrs) due to surgical blood loss or risk of bleeding: no

## 2017-07-10 LAB — BASIC METABOLIC PANEL
Anion gap: 7 (ref 5–15)
BUN: 8 mg/dL (ref 6–20)
CO2: 21 mmol/L — ABNORMAL LOW (ref 22–32)
Calcium: 8 mg/dL — ABNORMAL LOW (ref 8.9–10.3)
Chloride: 104 mmol/L (ref 101–111)
Creatinine, Ser: 0.81 mg/dL (ref 0.44–1.00)
GFR calc Af Amer: >60 mL/min (ref >60)
GFR calc non Af Amer: >60 mL/min (ref >60)
Glucose, Bld: 98 mg/dL (ref 65–99)
Potassium: 4.2 mmol/L (ref 3.5–5.1)
Sodium: 132 mmol/L — ABNORMAL LOW (ref 135–145)

## 2017-07-10 LAB — CBC
HCT: 33.4 % — ABNORMAL LOW (ref 36.0–46.0)
Hemoglobin: 11.4 g/dL — ABNORMAL LOW (ref 12.0–15.0)
MCH: 32.3 pg (ref 26.0–34.0)
MCHC: 34.1 g/dL (ref 30.0–36.0)
MCV: 94.6 fL (ref 78.0–100.0)
Platelets: 406 10*3/uL — ABNORMAL HIGH (ref 150–400)
RBC: 3.53 MIL/uL — ABNORMAL LOW (ref 3.87–5.11)
RDW: 14.7 % (ref 11.5–15.5)
WBC: 8.3 10*3/uL (ref 4.0–10.5)

## 2017-07-10 MED ORDER — TRAMADOL HCL 50 MG PO TABS
50.0000 mg | ORAL_TABLET | Freq: Four times a day (QID) | ORAL | Status: DC | PRN
  Administered 2017-07-10 – 2017-07-14 (×9): 50 mg via ORAL
  Filled 2017-07-10 (×10): qty 1

## 2017-07-10 MED ORDER — LEVOTHYROXINE SODIUM 75 MCG PO TABS
75.0000 ug | ORAL_TABLET | Freq: Every day | ORAL | Status: DC
  Administered 2017-07-11 – 2017-07-14 (×4): 75 ug via ORAL
  Filled 2017-07-10 (×4): qty 1

## 2017-07-10 MED ORDER — LORAZEPAM 0.5 MG PO TABS
0.2500 mg | ORAL_TABLET | Freq: Four times a day (QID) | ORAL | Status: DC | PRN
  Administered 2017-07-11 – 2017-07-13 (×3): 0.25 mg via ORAL
  Filled 2017-07-10 (×3): qty 1

## 2017-07-10 MED ORDER — ZOLPIDEM TARTRATE 5 MG PO TABS
5.0000 mg | ORAL_TABLET | Freq: Every evening | ORAL | Status: DC | PRN
  Administered 2017-07-10 – 2017-07-13 (×4): 5 mg via ORAL
  Filled 2017-07-10 (×4): qty 1

## 2017-07-10 NOTE — Progress Notes (Signed)
Assessment Active Problems:   Small bowel diverticulosis s/p small bowel resection 07/09/17-low BP this AM due to erroneous BP cuff size; BP wnl when check with proper cuff size.  Chronic anxiety disorder-she is requesting her Lorazepam as needed if needed.   Plan:  Restart anxiolytic prn.  Clear liquids.  OOB.   LOS: 1 day     1 Day Post-Op  Chief Complaint/Subjective: She reports adequate pain control.  No nausea.  She would like to have her medications for anxiety back again if she needs them.  Objective: Vital signs in last 24 hours: Temp:  [97.6 F (36.4 C)-98.3 F (36.8 C)] 98.3 F (36.8 C) (08/11 0631) Pulse Rate:  [16-91] 84 (08/11 0631) Resp:  [10-18] 16 (08/11 0631) BP: (78-135)/(46-72) 78/60 (08/11 0631) SpO2:  [91 %-100 %] 91 % (08/11 0631) Last BM Date: 07/09/17  Intake/Output from previous day: 08/10 0701 - 08/11 0700 In: 2675 [I.V.:2675] Out: 1650 [Urine:1600; Blood:50] Intake/Output this shift: Total I/O In: -  Out: 400 [Urine:400]  PE: General- In NAD.  Awake and alert. Abdomen-soft, some distention, incision is clean and dry, few bowel sounds present.  Lab Results:   Recent Labs  07/09/17 1503  WBC 2.5*  HGB 13.1  HCT 38.0  PLT 446*   BMET  Recent Labs  07/09/17 1503 07/10/17 0530  NA  --  132*  K  --  4.2  CL  --  104  CO2  --  21*  GLUCOSE  --  98  BUN  --  8  CREATININE 0.72 0.81  CALCIUM  --  8.0*   PT/INR No results for input(s): LABPROT, INR in the last 72 hours. Comprehensive Metabolic Panel:    Component Value Date/Time   NA 132 (L) 07/10/2017 0530   NA 129 (L) 07/06/2017 1331   K 4.2 07/10/2017 0530   K 4.6 07/06/2017 1331   CL 104 07/10/2017 0530   CL 94 (L) 07/06/2017 1331   CO2 21 (L) 07/10/2017 0530   CO2 25 07/06/2017 1331   BUN 8 07/10/2017 0530   BUN 13 07/06/2017 1331   CREATININE 0.81 07/10/2017 0530   CREATININE 0.72 07/09/2017 1503   GLUCOSE 98 07/10/2017 0530   GLUCOSE 98 07/06/2017 1331   CALCIUM 8.0 (L) 07/10/2017 0530   CALCIUM 9.3 07/06/2017 1331   AST 18 03/05/2017 0515   AST 29 02/26/2017 1314   ALT 12 (L) 03/05/2017 0515   ALT 20 02/26/2017 1314   ALKPHOS 38 03/05/2017 0515   ALKPHOS 50 02/26/2017 1314   BILITOT 0.6 03/05/2017 0515   BILITOT 0.6 02/26/2017 1314   PROT 5.3 (L) 03/05/2017 0515   PROT 7.4 02/26/2017 1314   ALBUMIN 2.9 (L) 03/05/2017 0515   ALBUMIN 4.4 02/26/2017 1314     Studies/Results: No results found.  Anti-infectives: Anti-infectives    Start     Dose/Rate Route Frequency Ordered Stop   07/09/17 0759  cefoTEtan in Dextrose 5% (CEFOTAN) IVPB 2 g     2 g Intravenous On call to O.R. 07/09/17 0759 07/09/17 1003       Caldwell Kronenberger J 07/10/2017

## 2017-07-10 NOTE — Progress Notes (Signed)
Pt tolerated clear liquid diet today with no complaints, is voiding, and ambulated in hallway x2.

## 2017-07-11 LAB — CBC
HCT: 30 % — ABNORMAL LOW (ref 36.0–46.0)
Hemoglobin: 10.6 g/dL — ABNORMAL LOW (ref 12.0–15.0)
MCH: 32.4 pg (ref 26.0–34.0)
MCHC: 35.3 g/dL (ref 30.0–36.0)
MCV: 91.7 fL (ref 78.0–100.0)
Platelets: 414 10*3/uL — ABNORMAL HIGH (ref 150–400)
RBC: 3.27 MIL/uL — ABNORMAL LOW (ref 3.87–5.11)
RDW: 14.4 % (ref 11.5–15.5)
WBC: 9.3 10*3/uL (ref 4.0–10.5)

## 2017-07-11 LAB — BASIC METABOLIC PANEL
Anion gap: 5 (ref 5–15)
BUN: 7 mg/dL (ref 6–20)
CO2: 21 mmol/L — ABNORMAL LOW (ref 22–32)
Calcium: 7.8 mg/dL — ABNORMAL LOW (ref 8.9–10.3)
Chloride: 100 mmol/L — ABNORMAL LOW (ref 101–111)
Creatinine, Ser: 0.66 mg/dL (ref 0.44–1.00)
GFR calc Af Amer: >60 mL/min (ref >60)
GFR calc non Af Amer: >60 mL/min (ref >60)
Glucose, Bld: 99 mg/dL (ref 65–99)
Potassium: 4.2 mmol/L (ref 3.5–5.1)
Sodium: 126 mmol/L — ABNORMAL LOW (ref 135–145)

## 2017-07-11 NOTE — Progress Notes (Signed)
  General Surgery [facility]  Assessment & Plan: POD#2 - status post small bowel resection  Continue clear liquid diet  Nasal cannula oxygen for subjective SOB  Encouraged IS, OOB, ambulate  Await resolution of ileus        Sara Regal, MD, [facility]       Office: [phone]    Chief Complaint: Small bowel diverticula  Subjective: Patient short of breath this AM, oxygen restarted.  No flatus or BM's yet.  Has been OOB.  Objective: Vital signs in last 24 hours: Temp:  [97.8 F (36.6 C)-98.5 F (36.9 C)] 97.8 F (36.6 C) (08/12 0508) Pulse Rate:  [83-89] 84 (08/12 0508) Resp:  [16-18] 18 (08/12 0508) BP: (101-114)/(48-64) 104/59 (08/12 0508) SpO2:  [93 %-97 %] 93 % (08/12 0508) Last BM Date: 07/09/17  Intake/Output from previous day: 08/11 0701 - 08/12 0700 In: 2875 [I.V.:2875] Out: 1600 [Urine:1600] Intake/Output this shift: Total I/O In: 120 [P.O.:120] Out: -   Physical Exam: HEENT - sclerae clear, mucous membranes moist Neck - soft Abdomen - protuberant; rare BS noted; wound dry and intact Ext - no edema, non-tender Neuro - alert & oriented, no focal deficits  Lab Results:   Recent Labs  07/10/17 0530 07/11/17 0533  WBC 8.3 9.3  HGB 11.4* 10.6*  HCT 33.4* 30.0*  PLT 406* 414*   BMET  Recent Labs  07/10/17 0530 07/11/17 0533  NA 132* 126*  K 4.2 4.2  CL 104 100*  CO2 21* 21*  GLUCOSE 98 99  BUN 8 7  CREATININE 0.81 0.66  CALCIUM 8.0* 7.8*   PT/INR No results for input(s): LABPROT, INR in the last 72 hours. Comprehensive Metabolic Panel:    Component Value Date/Time   NA 126 (L) 07/11/2017 0533   NA 132 (L) 07/10/2017 0530   K 4.2 07/11/2017 0533   K 4.2 07/10/2017 0530   CL 100 (L) 07/11/2017 0533   CL 104 07/10/2017 0530   CO2 21 (L) 07/11/2017 0533   CO2 21 (L) 07/10/2017 0530   BUN 7 07/11/2017 0533   BUN 8 07/10/2017 0530   CREATININE 0.66 07/11/2017 0533   CREATININE 0.81 07/10/2017 0530   GLUCOSE 99 07/11/2017 0533   GLUCOSE 98 07/10/2017 0530   CALCIUM 7.8 (L) 07/11/2017 0533   CALCIUM 8.0 (L) 07/10/2017 0530   AST 18 03/05/2017 0515   AST 29 02/26/2017 1314   ALT 12 (L) 03/05/2017 0515   ALT 20 02/26/2017 1314   ALKPHOS 38 03/05/2017 0515   ALKPHOS 50 02/26/2017 1314   BILITOT 0.6 03/05/2017 0515   BILITOT 0.6 02/26/2017 1314   PROT 5.3 (L) 03/05/2017 0515   PROT 7.4 02/26/2017 1314   ALBUMIN 2.9 (L) 03/05/2017 0515   ALBUMIN 4.4 02/26/2017 1314    Studies/Results: No results found.    [name] M 07/11/2017  Patient ID: Sara Davila, female   DOB: 1930-06-24, 83 y.o.   MRN: [id number]

## 2017-07-11 NOTE — Anesthesia Postprocedure Evaluation (Signed)
Anesthesia Post Note  Patient: Sara Davila  Procedure(s) Performed: Procedure(s) (LRB): EXPLORATORY LAPAROTOMY (N/A) SMALL BOWEL RESECTION (N/A)     Patient location during evaluation: PACU Anesthesia Type: General Level of consciousness: awake and alert Pain management: pain level controlled Vital Signs Assessment: post-procedure vital signs reviewed and stable Respiratory status: spontaneous breathing, nonlabored ventilation, respiratory function stable and patient connected to nasal cannula oxygen Cardiovascular status: blood pressure returned to baseline and stable Postop Assessment: no signs of nausea or vomiting Anesthetic complications: no    Last Vitals:  Vitals:   07/11/17 0508 07/11/17 1400  BP: (!) 104/59 (!) 114/58  Pulse: 84 (!) 109  Resp: 18 18  Temp: 36.6 C 37.1 C  SpO2: 93% 95%    Last Pain:  Vitals:   07/11/17 1400  TempSrc: Oral  PainSc:                  Deangelo Berns P Torie Priebe

## 2017-07-12 LAB — COMPREHENSIVE METABOLIC PANEL
ALT: 55 U/L — ABNORMAL HIGH (ref 14–54)
AST: 40 U/L (ref 15–41)
Albumin: 2.8 g/dL — ABNORMAL LOW (ref 3.5–5.0)
Alkaline Phosphatase: 71 U/L (ref 38–126)
Anion gap: 7 (ref 5–15)
BUN: 5 mg/dL — ABNORMAL LOW (ref 6–20)
CO2: 22 mmol/L (ref 22–32)
Calcium: 8.7 mg/dL — ABNORMAL LOW (ref 8.9–10.3)
Chloride: 99 mmol/L — ABNORMAL LOW (ref 101–111)
Creatinine, Ser: 0.72 mg/dL (ref 0.44–1.00)
GFR calc Af Amer: >60 mL/min (ref >60)
GFR calc non Af Amer: >60 mL/min (ref >60)
Glucose, Bld: 122 mg/dL — ABNORMAL HIGH (ref 65–99)
Potassium: 4.1 mmol/L (ref 3.5–5.1)
Sodium: 128 mmol/L — ABNORMAL LOW (ref 135–145)
Total Bilirubin: 1.2 mg/dL (ref 0.3–1.2)
Total Protein: 5.9 g/dL — ABNORMAL LOW (ref 6.5–8.1)

## 2017-07-12 LAB — CBC WITH DIFFERENTIAL/PLATELET
Basophils Absolute: 0 10*3/uL (ref 0.0–0.1)
Basophils Relative: 0 %
Eosinophils Absolute: 0.8 10*3/uL — ABNORMAL HIGH (ref 0.0–0.7)
Eosinophils Relative: 8 %
HCT: 34.3 % — ABNORMAL LOW (ref 36.0–46.0)
Hemoglobin: 12 g/dL (ref 12.0–15.0)
Lymphocytes Relative: 7 %
Lymphs Abs: 0.7 10*3/uL (ref 0.7–4.0)
MCH: 32.1 pg (ref 26.0–34.0)
MCHC: 35 g/dL (ref 30.0–36.0)
MCV: 91.7 fL (ref 78.0–100.0)
Monocytes Absolute: 0.5 10*3/uL (ref 0.1–1.0)
Monocytes Relative: 5 %
Neutro Abs: 7.7 10*3/uL (ref 1.7–7.7)
Neutrophils Relative %: 80 %
Platelets: 441 10*3/uL — ABNORMAL HIGH (ref 150–400)
RBC: 3.74 MIL/uL — ABNORMAL LOW (ref 3.87–5.11)
RDW: 14.1 % (ref 11.5–15.5)
WBC: 9.8 10*3/uL (ref 4.0–10.5)

## 2017-07-12 MED ORDER — PREMIER PROTEIN SHAKE: 2.0000 [oz_av] | Freq: Four times a day (QID) | ORAL | Status: DC

## 2017-07-12 MED ORDER — PREMIER PROTEIN SHAKE
11.0000 [oz_av] | Freq: Two times a day (BID) | ORAL | Status: DC
  Administered 2017-07-12 – 2017-07-13 (×2): 11 [oz_av] via ORAL

## 2017-07-12 MED ORDER — PANTOPRAZOLE SODIUM 40 MG PO TBEC
40.0000 mg | DELAYED_RELEASE_TABLET | Freq: Every day | ORAL | Status: DC
  Administered 2017-07-12 – 2017-07-13 (×2): 40 mg via ORAL
  Filled 2017-07-12 (×2): qty 1

## 2017-07-12 MED ORDER — KCL IN DEXTROSE-NACL 20-5-0.9 MEQ/L-%-% IV SOLN
INTRAVENOUS | Status: DC
  Administered 2017-07-12: 14:00:00 via INTRAVENOUS
  Filled 2017-07-12 (×2): qty 1000

## 2017-07-12 MED ORDER — ENSURE ENLIVE PO LIQD
237.0000 mL | Freq: Once | ORAL | Status: AC
Start: 2017-07-12 — End: 2017-07-12
  Administered 2017-07-12: 237 mL via ORAL

## 2017-07-12 NOTE — Progress Notes (Addendum)
Assessment Active Problems:   Small bowel diverticulosis s/p small bowel resection 07/09/17-bowel function returning. Hemoglobin is stable.   Hyponatremia  Chronic anxiety disorder-she is requesting her Lorazepam as needed if needed.  Deconditioned and malnourished state   Plan:  Change IVF.  Soft diet.  PT/OT consults.  Care management consult.   LOS: 3 days     3 Days Post-Op  Chief Complaint/Subjective: Having loose stools with some dark blood in them.  Tolerating liquid diet. Has decreased bloating.  Objective: Vital signs in last 24 hours: Temp:  [97.8 F (36.6 C)-98.8 F (37.1 C)] 97.8 F (36.6 C) (08/13 0600) Pulse Rate:  [80-109] 80 (08/13 0600) Resp:  [18] 18 (08/13 0600) BP: (114-125)/(58-72) 120/68 (08/13 0600) SpO2:  [94 %-95 %] 95 % (08/13 0600) Last BM Date: 07/11/17  Intake/Output from previous day: 08/12 0701 - 08/13 0700 In: 2297.1 [P.O.:720; I.V.:1577.1] Out: 1850 [Urine:1850] Intake/Output this shift: Total I/O In: 600 [P.O.:600] Out: -   PE: General- In NAD.  Awake and alert. Abdomen-soft, some distention, incision is clean and dry, active bowel sounds present.  Lab Results:   Recent Labs  07/11/17 0533 07/12/17 0855  WBC 9.3 9.8  HGB 10.6* 12.0  HCT 30.0* 34.3*  PLT 414* 441*   BMET  Recent Labs  07/11/17 0533 07/12/17 0855  NA 126* 128*  K 4.2 4.1  CL 100* 99*  CO2 21* 22  GLUCOSE 99 122*  BUN 7 5*  CREATININE 0.66 0.72  CALCIUM 7.8* 8.7*   PT/INR No results for input(s): LABPROT, INR in the last 72 hours. Comprehensive Metabolic Panel:    Component Value Date/Time   NA 128 (L) 07/12/2017 0855   NA 126 (L) 07/11/2017 0533   K 4.1 07/12/2017 0855   K 4.2 07/11/2017 0533   CL 99 (L) 07/12/2017 0855   CL 100 (L) 07/11/2017 0533   CO2 22 07/12/2017 0855   CO2 21 (L) 07/11/2017 0533   BUN 5 (L) 07/12/2017 0855   BUN 7 07/11/2017 0533   CREATININE 0.72 07/12/2017 0855   CREATININE 0.66 07/11/2017 0533   GLUCOSE 122  (H) 07/12/2017 0855   GLUCOSE 99 07/11/2017 0533   CALCIUM 8.7 (L) 07/12/2017 0855   CALCIUM 7.8 (L) 07/11/2017 0533   AST 40 07/12/2017 0855   AST 18 03/05/2017 0515   ALT 55 (H) 07/12/2017 0855   ALT 12 (L) 03/05/2017 0515   ALKPHOS 71 07/12/2017 0855   ALKPHOS 38 03/05/2017 0515   BILITOT 1.2 07/12/2017 0855   BILITOT 0.6 03/05/2017 0515   PROT 5.9 (L) 07/12/2017 0855   PROT 5.3 (L) 03/05/2017 0515   ALBUMIN 2.8 (L) 07/12/2017 0855   ALBUMIN 2.9 (L) 03/05/2017 0515     Studies/Results: No results found.  Anti-infectives: Anti-infectives    Start     Dose/Rate Route Frequency Ordered Stop   07/09/17 0759  cefoTEtan in Dextrose 5% (CEFOTAN) IVPB 2 g     2 g Intravenous On call to O.R. 07/09/17 0759 07/09/17 1003       Maliya Marich J 07/12/2017

## 2017-07-12 NOTE — Care Management Important Message (Signed)
Important Message  Patient Details  Name: RUTHE ROEMER MRN: [id number] Date of Birth: Jul 25, 1930   Medicare Important Message Given:  Yes    Kerin Salen 07/12/2017, 11:42 AMImportant Message  Patient Details  Name: OLETHA TOLSON MRN: [id number] Date of Birth: [birth year]   Medicare Important Message Given:  Yes    Kerin Salen 07/12/2017, 11:42 AM

## 2017-07-12 NOTE — Progress Notes (Signed)
Initial Nutrition Assessment  DOCUMENTATION CODES:   Severe malnutrition in context of chronic illness, Underweight  INTERVENTION:   Continue Premier Protein BID, each supplement provides 160kcal and 30g protein.  Will trial a bottle of Ensure   RD will continue to monitor  NUTRITION DIAGNOSIS:   Malnutrition(severe) related to chronic illness (diverticulosis) as evidenced by energy intake < or equal to 75% for > or equal to 1 month, severe depletion of body fat, severe depletion of muscle mass.  GOAL:   Patient will meet greater than or equal to 90% of their needs  MONITOR:   PO intake, Supplement acceptance, Labs, Weight trends, I & O's  REASON FOR ASSESSMENT:    (Low BMI: 17.8)    ASSESSMENT:   83 year old female who presents with non-malignant abdominal pain. I am ready to die-I figure I will perforate.  The pain meds that I gave her have made things a little better.  She would like to try either resection or plication of her diverticula of her small bowel.  This will need to be done with a laparotomy but will go in with laparoscope first.  I have explained that resection of bowel or plication may not make things better but at this point she is ready to try.  She stays hungry all of the time but eats and gets bloating and pain.   8/10: s/p Small bowel diverticulosis s/p small bowel resection  Patient in room with no family at bedside. Pt reports ordering eggs, yogurt and banana for breakfast. Diet was just advanced to soft diet from clear liquids. Pt states she is afraid the solid food will cause diarrhea. Pt states she typically doesn't eat that much at home, just breakfast and dinner. Pt becomes full very quickly.  Pt has not been drinking any kind of protein supplement. She has been ordered [employer] but would like to try other supplements options. Will order Ensure supplements for patient to try.   Pt states she thinks she weighs 88 lb. Chart reveals weight to be  91 lb. Nutrition-Focused physical exam completed. Findings are severe fat depletion, severe muscle depletion, and no edema.   Medications: IV Protonix daily, D5 and .9% NaCl w/ KCl infusion at 30 ml/hr -provides 122 kcal Labs reviewed: Low Na  Diet Order:  DIET SOFT Room service appropriate? Yes; Fluid consistency: Thin  Skin:   (8/10 abdominal incision)  Last BM:  8/12  Height:   Ht Readings from Last 1 Encounters:  07/09/17 5' (1.524 m)    Weight:   Wt Readings from Last 1 Encounters:  07/09/17 91 lb (41.3 kg)    Ideal Body Weight:  45.5 kg  BMI:  Body mass index is 17.77 kg/m.  Estimated Nutritional Needs:   Kcal:  1250-1450  Protein:  55-65g  Fluid:  1.5L/day  EDUCATION NEEDS:   Education needs addressed  Clayton Bibles, MS, RD, LDN Pager: [phone] After Hours Pager: [phone]

## 2017-07-13 LAB — BASIC METABOLIC PANEL
Anion gap: 7 (ref 5–15)
BUN: 9 mg/dL (ref 6–20)
CO2: 25 mmol/L (ref 22–32)
Calcium: 8.6 mg/dL — ABNORMAL LOW (ref 8.9–10.3)
Chloride: 98 mmol/L — ABNORMAL LOW (ref 101–111)
Creatinine, Ser: 0.58 mg/dL (ref 0.44–1.00)
GFR calc Af Amer: >60 mL/min (ref >60)
GFR calc non Af Amer: >60 mL/min (ref >60)
Glucose, Bld: 109 mg/dL — ABNORMAL HIGH (ref 65–99)
Potassium: 4 mmol/L (ref 3.5–5.1)
Sodium: 130 mmol/L — ABNORMAL LOW (ref 135–145)

## 2017-07-13 MED ORDER — TRAMADOL HCL 50 MG PO TABS: 50.0000 mg | ORAL_TABLET | Freq: Four times a day (QID) | ORAL | 0 refills | Status: DC | PRN

## 2017-07-13 MED ORDER — ACETAMINOPHEN 325 MG PO TABS
650.0000 mg | ORAL_TABLET | Freq: Four times a day (QID) | ORAL | Status: DC | PRN
  Administered 2017-07-14: 650 mg via ORAL
  Filled 2017-07-13 (×2): qty 2

## 2017-07-13 NOTE — Clinical Social Work Note (Signed)
Clinical Social Work Assessment  Patient Details  Name: Sara Davila MRN: [id number] Date of Birth: Feb 24, 1930  Date of referral:  07/13/17               Reason for consult:  Facility Placement, Discharge Planning (From [facility])                Permission sought to share information with:  Case Manager, Customer service manager Permission granted to share information::  Yes, Verbal Permission Granted  Name::        Agency::  [facility]  Relationship::     Contact Information:     Housing/Transportation Living arrangements for the past 2 months:  [place] ([place]) Source of Information:  Facility, Medical Team, Case Manager Patient Interpreter Needed:  None Criminal Activity/Legal Involvement Pertinent to Current Situation/Hospitalization:  No - Comment as needed Significant Relationships:  Other Family Members, Adult Children, Community Support Lives with:  Facility Resident Do you feel safe going back to the place where you live?  Yes Need for family participation in patient care:  Yes (Comment)  Care giving concerns:  LCSW received call from facility reporting patient is from [facility].  Reports that discussion has been to possibly move patient into SNF for short term.  Requested PT consult which was completed. Patient is walking 240 feet and is fatigued, but is minimum assist and most likely will only receive benefit for 3 days.  Discussed with facility and they are working with business office to see if patient will qualify.  Will follow up with this Probation officer as they may possibly have patient pay privately or move her into assisted for a short time.     Social Worker assessment / plan:  Assessment completed and consulted as patient might need a higher level of care.  Awaiting call back from [facility] to define plan and disposition.  Will continue to follow and assist with  disposition and needs.  Employment status:  Retired Nurse, adult PT Recommendations:  [address] [place] / Referral to community resources:  [place]  Patient/Family's Response to care:  Still assessing. Awaiting call back. Will define plan once business office has completed assessment.  Patient/Family's Understanding of and Emotional Response to Diagnosis, Current Treatment, and Prognosis:  Still assessing.  Emotional Assessment Appearance:  Appears stated age Attitude/Demeanor/Rapport:    Affect (typically observed):  Accepting, Adaptable Orientation:  Oriented to Self, Oriented to Place, Oriented to  Time, Oriented to Situation Alcohol / Substance use:  Not Applicable Psych involvement (Current and /or in the community):  No (Comment)  Discharge Needs  Concerns to be addressed:  No discharge needs identified Readmission within the last 30 days:  No Current discharge risk:  None Barriers to Discharge:  Continued Medical Work up   Lilly [place], LCSW 07/13/2017, 11:33 AM

## 2017-07-13 NOTE — Progress Notes (Signed)
Assessment Active Problems:   Small bowel diverticulosis s/p small bowel resection 07/09/17-stable bowel function; she is somewhat unsteady. Hyponatremia-better  Chronic anxiety disorder-on home meds  Deconditioned and malnourished state-on protein supplements   Plan:  Heplock IV.  Await consults.  LOS: 4 days     4 Days Post-Op  Chief Complaint/Subjective: She feels unsteady.  She reports that she did not see anyone from PT, OT, or case management. She is eating some.  Bowels are moving.    Objective: Vital signs in last 24 hours: Temp:  [98.1 F (36.7 C)-99.1 F (37.3 C)] 98.1 F (36.7 C) (08/14 0532) Pulse Rate:  [72-83] 72 (08/14 0532) Resp:  [15-18] 15 (08/14 0532) BP: (113-124)/(65-69) 113/69 (08/14 0532) SpO2:  [94 %-96 %] 96 % (08/14 0532) Last BM Date: 07/11/17  Intake/Output from previous day: 08/13 0701 - 08/14 0700 In: 2187 [P.O.:1820; I.V.:367] Out: -  Intake/Output this shift: No intake/output data recorded.  PE: General- In NAD.  Awake and alert. Abdomen-soft, some distention (which is her chronic state), incision is clean and dry, active bowel sounds present.  Lab Results:   Recent Labs  07/11/17 0533 07/12/17 0855  WBC 9.3 9.8  HGB 10.6* 12.0  HCT 30.0* 34.3*  PLT 414* 441*   BMET  Recent Labs  07/12/17 0855 07/13/17 0539  NA 128* 130*  K 4.1 4.0  CL 99* 98*  CO2 22 25  GLUCOSE 122* 109*  BUN 5* 9  CREATININE 0.72 0.58  CALCIUM 8.7* 8.6*   PT/INR No results for input(s): LABPROT, INR in the last 72 hours. Comprehensive Metabolic Panel:    Component Value Date/Time   NA 130 (L) 07/13/2017 0539   NA 128 (L) 07/12/2017 0855   K 4.0 07/13/2017 0539   K 4.1 07/12/2017 0855   CL 98 (L) 07/13/2017 0539   CL 99 (L) 07/12/2017 0855   CO2 25 07/13/2017 0539   CO2 22 07/12/2017 0855   BUN 9 07/13/2017 0539   BUN 5 (L) 07/12/2017 0855   CREATININE 0.58 07/13/2017 0539   CREATININE 0.72 07/12/2017 0855   GLUCOSE 109 (H) 07/13/2017  0539   GLUCOSE 122 (H) 07/12/2017 0855   CALCIUM 8.6 (L) 07/13/2017 0539   CALCIUM 8.7 (L) 07/12/2017 0855   AST 40 07/12/2017 0855   AST 18 03/05/2017 0515   ALT 55 (H) 07/12/2017 0855   ALT 12 (L) 03/05/2017 0515   ALKPHOS 71 07/12/2017 0855   ALKPHOS 38 03/05/2017 0515   BILITOT 1.2 07/12/2017 0855   BILITOT 0.6 03/05/2017 0515   PROT 5.9 (L) 07/12/2017 0855   PROT 5.3 (L) 03/05/2017 0515   ALBUMIN 2.8 (L) 07/12/2017 0855   ALBUMIN 2.9 (L) 03/05/2017 0515     Studies/Results: No results found.  Anti-infectives: Anti-infectives    Start     Dose/Rate Route Frequency Ordered Stop   07/09/17 0759  cefoTEtan in Dextrose 5% (CEFOTAN) IVPB 2 g     2 g Intravenous On call to O.R. 07/09/17 0759 07/09/17 1003       Kitrina Maurin J 07/13/2017

## 2017-07-13 NOTE — Discharge Instructions (Signed)
[facility], [place] [phone]  OPEN ABDOMINAL SURGERY: POST OP INSTRUCTIONS  Always review your discharge instruction sheet given to you by the facility where your surgery was performed.  IF YOU HAVE DISABILITY OR FAMILY LEAVE FORMS, YOU MUST BRING THEM TO THE OFFICE FOR PROCESSING.  PLEASE DO NOT GIVE THEM TO YOUR DOCTOR.  1. A prescription for pain medication may be given to you upon discharge.  Take your pain medication as prescribed, if needed.  If narcotic pain medicine is not needed, then you may take acetaminophen (Tylenol) or ibuprofen (Advil) as needed. 2. Take your usually prescribed medications unless otherwise directed. 3. If you need a refill on your pain medication, please contact your pharmacy. They will contact our office to request authorization.  Prescriptions will not be filled after 5pm or on week-ends. 4. You should follow the diet you and your doctor discussed.  Be sure to include lots of fluids daily. Most patients will experience some swelling and bruising in the area of the incision. Ice pack will help. Swelling and bruising can take several days to resolve..  5. It is common to experience some constipation if taking pain medication after surgery.  Increasing fluid intake and taking a stool softener will usually help or prevent this problem from occurring.  A mild laxative (Milk of Magnesia or Miralax) should be taken according to package directions if there are no bowel movements after 48 hours. 6.  You may have steri-strips (small skin tapes) in place directly over the incision.  These strips should be left on the skin.  If your surgeon used skin glue on the incision, you may shower in 24 hours.  The glue will flake off over the next 2-3 weeks.  Any sutures or staples will be removed at the office during your follow-up visit. You may find that a light gauze bandage over your incision may keep your staples from being rubbed or pulled. You may shower and  replace the bandage daily. 7. ACTIVITIES:  You may resume regular (light) daily activities beginning the next day--such as daily self-care, walking, climbing stairs--gradually increasing activities as tolerated.  You may have sexual intercourse when it is comfortable.  Refrain from any heavy lifting or straining for at least 6 weeks.  Do not lift anything over 10 pounds.  a. You may drive when you no longer are taking prescription pain medication, you can comfortably wear a seatbelt, and you can safely maneuver your car and apply brakes b. Return to Work: _When released to do so by doctor.__________________________________ 8. You should see your doctor in the office for a follow-up appointment approximately 2-3 weeks after your surgery.  Make sure that you call for this appointment within a day or two after you arrive home to insure a convenient appointment time. OTHER INSTRUCTIONS:  _____________________________________________________________ _____________________________________________________________  WHEN TO CALL YOUR DOCTOR: 1. Fever over 101.0 2. Inability to urinate 3. Nausea and/or vomiting 4. Extreme swelling or bruising 5. Continued bleeding from incision. 6. Increased pain, redness, or drainage from the incision.  The clinic staff is available to answer your questions during regular business hours.  Please dont hesitate to call and ask to speak to one of the nurses if you have concerns.  For further questions, please visit [email or web address]

## 2017-07-13 NOTE — Evaluation (Signed)
Physical Therapy Evaluation Patient Details Name: BRENNEN GARDINER MRN: [id number] DOB: 03-21-1930 Today's Date: 07/13/2017   History of Present Illness  Pt s/p small bowel resection   Clinical Impression  Pt is s/p procedure listed above. Pt would benefit from skilled PT in order to improve the deficits listed below. Pt would benefit from skilled PT in order to work on mobility and strength in order to increase independence and return to baseline functioning. Pt tolerated session well but was limited by fatigue. Pt reports feeling tired and fatigued throughout session. Pt provided with verbal cues on how to reduce stress on abdomen with bed mobility. Pt would benefit from SNF.    Follow Up Recommendations SNF    Equipment Recommendations  None recommended by PT    Recommendations for Other Services       Precautions / Restrictions Precautions Precautions: Fall Precaution Comments: Pt had a soft collar in the room and reports only wearing it for comfort  Restrictions Weight Bearing Restrictions: No      Mobility  Bed Mobility Overal bed mobility: Needs Assistance Bed Mobility: Rolling;Sidelying to Sit;Sit to Supine Rolling: Min guard   Supine to sit: Min guard Sit to supine: Min assist   General bed mobility comments: Pt was provided verbal cues to perform rolling technique then sidelying to sit in order to limit stress on abdomen, pt requires min assist for LE management with sit to supine   Transfers Overall transfer level: Needs assistance Equipment used: Rolling walker (2 wheeled) Transfers: Sit to/from Stand Sit to Stand: Min guard         General transfer comment: Min guard provided for safety   Ambulation/Gait Ambulation/Gait assistance: Min guard Ambulation Distance (Feet): 240 Feet Assistive device: Rolling walker (2 wheeled) Gait Pattern/deviations: Step-through pattern;Decreased stride length;Trunk flexed Gait velocity: decr    General Gait Details:  Pt reports feeling tired and fatigued but willing to ambulate, pt provided with verbal cues for sequencing   Stairs            Wheelchair Mobility    Modified Rankin (Stroke Patients Only)       Balance Overall balance assessment: Needs assistance Sitting-balance support: Feet supported Sitting balance-Leahy Scale: Good       Standing balance-Leahy Scale: Fair Standing balance comment: Pt requires RW and bilat UE support for ambulation                              Pertinent Vitals/Pain Pain Assessment: Faces Faces Pain Scale: Hurts a little bit Pain Location: Abdomen  Pain Descriptors / Indicators: Sore;Aching Pain Intervention(s): Limited activity within patient's tolerance;Monitored during session;Repositioned    Home Living Family/patient expects to be discharged to:: Private residence ([facility] ) Living Arrangements: Alone Available Help at Discharge: Available PRN/intermittently Type of Home: Apartment Home Access: Level entry     Home Layout: One level Home Equipment: Cane - single point;Walker - 4 wheels      Prior Function Level of Independence: Independent               Hand Dominance        Extremity/Trunk Assessment   Upper Extremity Assessment Upper Extremity Assessment: Generalized weakness    Lower Extremity Assessment Lower Extremity Assessment: Generalized weakness       Communication   Communication: No difficulties  Cognition Arousal/Alertness: Awake/alert Behavior During Therapy: WFL for tasks assessed/performed Overall Cognitive Status: Within Functional  Limits for tasks assessed                                        General Comments      Exercises     Assessment/Plan    PT Assessment Patient needs continued PT services  PT Problem List Decreased strength;Decreased mobility;Decreased safety awareness;Decreased activity tolerance;Decreased balance;Decreased  knowledge of use of DME       PT Treatment Interventions DME instruction;Gait training;Therapeutic activities;Therapeutic exercise;Patient/family education;Balance training;Functional mobility training    PT Goals (Current goals can be found in the Care Plan section)  Acute Rehab PT Goals Patient Stated Goal: Pt would like to return home and spend time with her significant other Roger  PT Goal Formulation: With patient Time For Goal Achievement: 07/27/17 Potential to Achieve Goals: Good    Frequency Min 3X/week   Barriers to discharge        Co-evaluation               AM-PAC PT "6 Clicks" Daily Activity  Outcome Measure Difficulty turning over in bed (including adjusting bedclothes, sheets and blankets)?: A Little Difficulty moving from lying on back to sitting on the side of the bed? : A Little Difficulty sitting down on and standing up from a chair with arms (e.g., wheelchair, bedside commode, etc,.)?: A Lot Help needed moving to and from a bed to chair (including a wheelchair)?: A Little Help needed walking in hospital room?: A Little Help needed climbing 3-5 steps with a railing? : A Little 6 Click Score: 17    End of Session Equipment Utilized During Treatment: Gait belt Activity Tolerance: Patient limited by fatigue Patient left: in bed;with call bell/phone within reach;with bed alarm set Nurse Communication: Mobility status (RN observed ambulation ) PT Visit Diagnosis: Muscle weakness (generalized) (M62.81);Difficulty in walking, not elsewhere classified (R26.2)    Time: 3570-1779 PT Time Calculation (min) (ACUTE ONLY): 23 min   Charges:         PT G Codes:        Olegario Shearer, SPT   Reino Bellis 07/13/2017, 10:44 AM

## 2017-07-13 NOTE — Progress Notes (Addendum)
Patient ID: Sara Davila, female   DOB: May 25, 1930, 83 y.o.   MRN: [id number] 4 Days Post-Op   Subjective: Feels tired and somewhat weak and insecure when up. She worked with physical therapy today and states [facility] although there is no note to that effect yet. She is tolerating a small amount of her soft diet without nausea or vomiting. Passing gas and some loose bowel movements. Denies abdominal pain.  Objective: Vital signs in last 24 hours: Temp:  [98.1 F (36.7 C)-99.1 F (37.3 C)] 98.1 F (36.7 C) (08/14 0532) Pulse Rate:  [72-83] 72 (08/14 0532) Resp:  [15-18] 15 (08/14 0532) BP: (113-124)/(65-69) 113/69 (08/14 0532) SpO2:  [94 %-96 %] 96 % (08/14 0532) Last BM Date: 07/11/17  Intake/Output from previous day: 08/13 0701 - 08/14 0700 In: 2187 [P.O.:1820; I.V.:367] Out: -  Intake/Output this shift: Total I/O In: 127 [I.V.:127] Out: -   General appearance: alert, cooperative and no distress GI: Moderately distended but soft and nontender Incision/Wound: No erythema or drainage  Lab Results:   Recent Labs  07/11/17 0533 07/12/17 0855  WBC 9.3 9.8  HGB 10.6* 12.0  HCT 30.0* 34.3*  PLT 414* 441*   BMET  Recent Labs  07/12/17 0855 07/13/17 0539  NA 128* 130*  K 4.1 4.0  CL 99* 98*  CO2 22 25  GLUCOSE 122* 109*  BUN 5* 9  CREATININE 0.72 0.58  CALCIUM 8.7* 8.6*     Studies/Results: No results found.  Anti-infectives: Anti-infectives    Start     Dose/Rate Route Frequency Ordered Stop   07/09/17 0759  cefoTEtan in Dextrose 5% (CEFOTAN) IVPB 2 g     2 g Intravenous On call to O.R. 07/09/17 0759 07/09/17 1003      Assessment/Plan: s/p Procedure(s): EXPLORATORY LAPAROTOMY SMALL BOWEL RESECTION Stable postoperatively without apparent complication. It appears she will require some higher level of care at discharge. If this could be arranged possibly would be stable for discharge  tomorrow.   LOS: 4 days    Yatziry Deakins T 07/13/2017

## 2017-07-13 NOTE — Evaluation (Signed)
Occupational Therapy Evaluation Patient Details Name: Sara Davila MRN: [id number] DOB: 1930-01-22 Today's Date: 07/13/2017    History of Present Illness Pt s/p small bowel resection    Clinical Impression   This 83 year old female was admitted for the above. She was independent with adls prior to admission, and she currently needs mod A for LB adls. Goals in acute are for supervision level.  Ps is from [facility] lving at [facility]    Follow Up Recommendations  SNF    Equipment Recommendations  None recommended by OT (likely.  Pt has a high commode)    Recommendations for Other Services       Precautions / Restrictions Precautions Precautions: Fall Precaution Comments: Pt had a soft collar in the room and reports only wearing it for comfort  Restrictions Weight Bearing Restrictions: No      Mobility Bed Mobility Overal bed mobility: Needs Assistance Bed Mobility: Rolling;Sidelying to Sit;Sit to Supine Rolling: Supervision   Supine to sit: Supervision Sit to supine: Min assist   General bed mobility comments: cues for sequence  Transfers Overall transfer level: Needs assistance Equipment used: Rolling walker (2 wheeled) Transfers: Sit to/from Stand Sit to Stand: Min guard         General transfer comment: min guard with PT    Balance Overall balance assessment: Needs assistance Sitting-balance support: Feet supported Sitting balance-Leahy Scale: Good       Standing balance-Leahy Scale: Fair                            ADL either performed or assessed with clinical judgement   ADL Overall ADL's : Needs assistance/impaired Eating/Feeding: Independent   Grooming: Set up;Sitting   Upper Body Bathing: Set up;Sitting   Lower Body Bathing: Moderate assistance;Sit to/from stand   Upper Body Dressing : Set up;Sitting   Lower Body Dressing: Sit to/from stand;Moderate assistance                 General ADL Comments: pt with 8/10 pain.   Wanted to have OT see her now vs later as she is expecting company.  Pt is familiar with reacher, but hers is broken. This would help her with LB adls. She is hoping to go to Skilled [facility] for a few days after discharge from hospital. Pt wears slip on shoes & no socks at baseline     Vision         Perception     Praxis      Pertinent Vitals/Pain Pain Assessment: Faces Pain Score: 8  Faces Pain Scale: Hurts a little bit Pain Location: Abdomen  Pain Descriptors / Indicators: Sore;Aching Pain Intervention(s): Limited activity within patient's tolerance;Monitored during session;Repositioned (RN aware per pt)     Hand Dominance     Extremity/Trunk Assessment Upper Extremity Assessment Upper Extremity Assessment: Overall WFL for tasks assessed. Pt has difficult adducting fingers On L hand; h/o c spine sx          Communication Communication Communication: No difficulties   Cognition Arousal/Alertness: Awake/alert Behavior During Therapy: WFL for tasks assessed/performed Overall Cognitive Status: Within Functional Limits for tasks assessed                                     General Comments       Exercises     Shoulder  Instructions      Home Living Family/patient expects to be discharged to:: Private residence ([facility]ives at [facility] ) Living Arrangements: Alone Available Help at Discharge: Available PRN/intermittently Type of Home: Apartment Home Access: Level entry     Home Layout: One [place]: [place] - single point;Walker - 4 wheels   Additional Comments: from I living at [facility]. Has a walk in tub and high commode      Prior Functioning/Environment Level of Independence: Independent                 OT Problem List: Decreased strength;Decreased activity tolerance;Decreased knowledge of use of DME or AE;Pain      OT Treatment/Interventions: Self-care/ADL training;DME and/or  AE instruction;Patient/family education    OT Goals(Current goals can be found in the care plan section) Acute Rehab OT Goals Patient Stated Goal: SNF then home OT Goal Formulation: With patient Time For Goal Achievement: 07/20/17 Potential to Achieve Goals: Good ADL Goals Pt Will Transfer to Toilet: with supervision;bedside commode;ambulating (vs high commode) Pt Will Perform Toileting - Clothing Manipulation and hygiene: with supervision;sit to/from stand Additional ADL Goal #1: pt will perform LB adls with supervision, sit to stand with reacher  OT Frequency: Min 2X/week   Barriers to D/C:            Co-evaluation              AM-PAC PT "6 Clicks" Daily Activity     Outcome Measure Help from another person eating meals?: None Help from another person taking care of personal grooming?: A Little Help from another person toileting, which includes using toliet, bedpan, or urinal?: A Little Help from another person bathing (including washing, rinsing, drying)?: A Lot Help from another person to put on and taking off regular upper body clothing?: A Little Help from another person to put on and taking off regular lower body clothing?: A Lot 6 Click Score: 17   End of Session    Activity Tolerance: Patient limited by pain Patient left: in bed;with call bell/phone within reach  OT Visit Diagnosis: Muscle weakness (generalized) (M62.81)                Time: 7412-8786 OT Time Calculation (min): 13 min Charges:  OT General Charges $OT Visit: 1 Procedure OT Evaluation $OT Eval Low Complexity: 1 Procedure G-Codes:     Sara Davila, OTR/L [phone] 07/13/2017  Sara Davila 07/13/2017, 2:09 PM

## 2017-07-14 ENCOUNTER — Ambulatory Visit: Payer: Self-pay | Admitting: General Surgery

## 2017-07-14 NOTE — Clinical Social Work Placement (Signed)
   CLINICAL SOCIAL WORK PLACEMENT  NOTE  Date:  07/14/2017  Patient Details  Name: Sara Davila MRN: [id number] Date of Birth: 06-25-1930  Clinical Social Work is seeking post-discharge placement for this patient at the [place] level of care (*CSW will initial, date and re-position this form in  chart as items are completed):  Yes   Patient/family provided with [facility] Work Department's list of facilities offering this level of care within the geographic area requested by the patient (or if unable, by the patient's family).  Yes   Patient/family informed of their freedom to choose among providers that offer the needed level of care, that participate in Medicare, Medicaid or managed care program needed by the patient, have an available bed and are willing to accept the patient.  Yes   Patient/family informed of [facility]'s ownership interest in [facility] and [facility], as well as of the fact that they are under no obligation to receive care at these facilities.  PASRR submitted to EDS on 07/14/17     PASRR number received on 07/14/17     Existing PASRR number confirmed on       FL2 transmitted to all facilities in geographic area requested by pt/family on 07/14/17     FL2 transmitted to all facilities within larger geographic area on       Patient informed that his/her managed care company has contracts with or will negotiate with certain facilities, including the following:            Patient/family informed of bed offers received.  Patient chooses bed at       Physician recommends and patient chooses bed at      Patient to be transferred to   on  .  Patient to be transferred to facility by       Patient family notified on   of transfer.  Name of family member notified:        PHYSICIAN Please sign FL2     Additional Comment:    _______________________________________________ Serafina Mitchell, [place] 07/14/2017,  10:19 AM

## 2017-07-14 NOTE — Progress Notes (Signed)
Discharge instructions discussed with patient, verbalized agreement and understanding, attempted to call report to [facility], no response,  Prescription for ultram given to patient

## 2017-07-14 NOTE — NC FL2 (Signed)
[place] LEVEL OF CARE SCREENING TOOL     IDENTIFICATION  Patient Name: Sara Davila Birthdate: 1930/03/03 Sex: female Admission Date (Current Location): 07/09/2017  [facility] and [place] Number:  Herbalist and Address:  [facility] [place], [place] [address], [place], [place] [address]      Provider Number: [id number]  Attending Physician Name and Address:  Johnathan Hausen, MD  Relative Name and Phone Number:  Dtr - [phone]    Current Level of Care: Hospital Recommended Level of Care: [place] Prior Approval Number:    Date Approved/Denied:   PASRR Number: [id number]  Discharge Plan: SNF    Current Diagnoses: Patient Active Problem List   Diagnosis Date Noted  . S/P small bowel resection 07/09/2017  . Abdominal pain 02/26/2017  . Hypothyroidism 02/26/2017  . Toe pain 09/06/2016  . Paresthesias 09/06/2016  . Fall 03/29/2012  . Right rib fracture 03/29/2012  . Peritoneal free air 03/29/2012    Orientation RESPIRATION BLADDER Height & Weight     Self, Time, Situation, Place  Normal Continent Weight: 91 lb (41.3 kg) Height:  5' (152.4 cm)  BEHAVIORAL SYMPTOMS/MOOD NEUROLOGICAL BOWEL NUTRITION STATUS      Continent Diet (See DC Summary)  AMBULATORY STATUS COMMUNICATION OF NEEDS Skin   Limited Assist Verbally Normal                       Personal Care Assistance Level of Assistance    Bathing Assistance: Limited assistance Feeding assistance: Limited assistance       Functional Limitations Info  Sight, Hearing, Speech Sight Info: Adequate Hearing Info: Adequate Speech Info: Adequate    SPECIAL CARE FACTORS FREQUENCY  PT (By licensed PT), OT (By licensed OT)     PT Frequency: 5x Wk OT Frequency: 5x Wk            Contractures Contractures Info: Not present    Additional Factors Info  Code Status, Allergies Code Status Info: Full Code Allergies Info: Codeine, Penicillins            Current Medications (07/14/2017):  This is the current hospital active medication list Current Facility-Administered Medications  Medication Dose Route Frequency Provider Last Rate Last Dose  . acetaminophen (TYLENOL) tablet 650 mg  650 mg Oral Q6H PRN Jackolyn Confer, MD   650 mg at 07/14/17 0518  . heparin injection 5,000 Units  5,000 Units Subcutaneous Q8H Johnathan Hausen, MD   5,000 Units at 07/14/17 0520  . levothyroxine (SYNTHROID, LEVOTHROID) tablet 75 mcg  75 mcg Oral QAC breakfast Jackolyn Confer, MD   75 mcg at 07/14/17 0716  . LORazepam (ATIVAN) tablet 0.25 mg  0.25 mg Oral Q6H PRN Jackolyn Confer, MD   0.25 mg at 07/13/17 2205  . ondansetron (ZOFRAN-ODT) disintegrating tablet 4 mg  4 mg Oral Q6H PRN Johnathan Hausen, MD       Or  . ondansetron [facility]) injection 4 mg  4 mg Intravenous Q6H PRN Johnathan Hausen, MD      . pantoprazole (PROTONIX) EC tablet 40 mg  40 mg Oral Candy Sledge, MD   40 mg at 07/13/17 2204  . protein supplement (PREMIER PROTEIN) liquid  11 oz Oral BID BM Johnathan Hausen, MD   11 oz at 07/13/17 1008  . traMADol (ULTRAM) tablet 50 mg  50 mg Oral Q6H PRN Jackolyn Confer, MD   50 mg at 07/14/17 0149  . zolpidem (AMBIEN) tablet 5  mg  5 mg Oral QHS PRN Jackolyn Confer, MD   5 mg at 07/13/17 2204     Discharge Medications: Please see discharge summary for a list of discharge medications.  Relevant Imaging Results:  Relevant Lab Results:   Additional Information [id number]  Linton Flemings B, LCSWA

## 2017-07-14 NOTE — Progress Notes (Signed)
Plan for d/c to SNF, discharge planning per CSW. [phone] 

## 2017-07-14 NOTE — Clinical Social Work Note (Addendum)
Clinical Social Worker facilitated patient discharge including contacting patient family (Dtr [employer]) and facility [facility]) to confirm patient discharge plans.  Clinical information faxed to facility and family agreeable with plan. [facility] transport will pick pt up at [employer] of the hospital.  RN to call report prior to DC to [phone], pt will go to room 56 @ [facility].  Clinical Social Worker will sign off for now as social work intervention is no longer needed. Please consult [place] again if new need arises.  Braedan Meuth B. Joline Maxcy Clinical Social Work Dept Weekend Social Worker [phone] 11:07 AM

## 2017-07-14 NOTE — Discharge Summary (Signed)
Physician Discharge Summary  Patient ID: Sara Davila MRN: [id number] DOB/AGE: [birth year] 81 y.o.  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 She was placed on her anxiolytics. She had some mild hyponatremia that was treated appropriately. She also had a mild ileus but had return of bowel function. Her diet was slowly advanced. She felt a little unsteady so PT and OT consults were obtained. They recommend her [facility] facility [facility]. On her fifth postoperative day she was doing most of her activities of daily living, tolerating a diet, and had bowel function. She was ready for discharge.  She'll need physical therapy and occupational therapy in the skilled nursing area. We will arrange for her to have follow-up in one week for staple removal.   Discharge Exam: Blood pressure 121/79, pulse 79, temperature 98 F (36.7 C), temperature source Axillary, resp. rate 16, height 5' (1.524 m), weight 41.3 kg (91 lb), SpO2 95 %.   Disposition: 04-Intermediate Care Facility   Allergies as of 07/14/2017      Reactions   Codeine Rash   Penicillins Rash   Has patient had a PCN reaction causing immediate rash, facial/tongue/throat swelling, SOB or lightheadedness with hypotension: No Has patient had a PCN reaction causing severe rash involving mucus membranes or skin necrosis: No Has patient had a PCN reaction that required hospitalization: No Has patient had a PCN reaction occurring within the last 10 years: No If all  of the above answers are "NO", then may proceed with Cephalosporin use.      Medication List    TAKE these medications   acetaminophen 500 MG tablet Commonly known as:  TYLENOL Take 1 tablet (500 mg total) by mouth every 6 (six) hours as needed for moderate pain or fever.   CALCIUM 500 PO Take 500 mg by mouth 2 (two) times daily.   Cranberry 1000 MG Caps Take 1,000 mg by mouth 2 (two) times daily.   GARLIC PO Take 1 tablet by mouth every evening.   levothyroxine 75 MCG tablet Commonly known as:  SYNTHROID, LEVOTHROID Take 75 mcg by mouth daily before breakfast.   LORazepam 0.5 MG tablet Commonly known as:  ATIVAN Take 0.25 mg by mouth at bedtime as needed for anxiety or sleep.   OCUVITE PO Take 1 tablet by mouth daily.   omeprazole 20 MG capsule Commonly known as:  PRILOSEC Take 20 mg by mouth every evening.   PROBIOTIC-10 Caps Take 1 capsule by mouth daily.   THERATEARS OP Apply 1 drop to eye daily.   traMADol 50 MG tablet Commonly known as:  ULTRAM Take 1 tablet (50 mg total) by mouth every 6 (six) hours as needed for moderate pain. What changed:  reasons to take this   Vitamin D 2000 units tablet Take 2,000 Units by mouth daily.   zolpidem 10 MG tablet Commonly known as:  AMBIEN Take 5-10 mg by mouth at bedtime as needed for sleep.        Signed: Odis Hollingshead 07/14/2017, 8:26 AM

## 2017-07-14 NOTE — Progress Notes (Signed)
Assessment Active Problems:   Small bowel diverticulosis s/p small bowel resection 07/09/17-she looks better this morning overall Hyponatremia-better  Chronic anxiety disorder-on home meds  Deconditioned and malnourished state-on protein supplements   Plan:  Discharge to skilled side of [facility].  Follow up next week in office for staple removal.   LOS: 5 days     5 Days Post-Op  Chief Complaint/Subjective: She is up at the sink putting her makeup on.  No BM yesterday but passing gas.  Take a gentle laxative chronically at home.  Eating ok. Objective: Vital signs in last 24 hours: Temp:  [97.7 F (36.5 C)-98.4 F (36.9 C)] 98 F (36.7 C) (08/15 0535) Pulse Rate:  [78-80] 79 (08/15 0535) Resp:  [16-18] 16 (08/15 0535) BP: (118-129)/(67-88) 121/79 (08/15 0535) SpO2:  [95 %-98 %] 95 % (08/15 0535) Last BM Date: 07/12/17  Intake/Output from previous day: 08/14 0701 - 08/15 0700 In: 727 [P.O.:600; I.V.:127] Out: -  Intake/Output this shift: No intake/output data recorded.  PE: General- In NAD.  Awake and alert. Abdomen-soft, some distention (which is her chronic state), incision is clean and dry  Lab Results:   Recent Labs  07/12/17 0855  WBC 9.8  HGB 12.0  HCT 34.3*  PLT 441*   BMET  Recent Labs  07/12/17 0855 07/13/17 0539  NA 128* 130*  K 4.1 4.0  CL 99* 98*  CO2 22 25  GLUCOSE 122* 109*  BUN 5* 9  CREATININE 0.72 0.58  CALCIUM 8.7* 8.6*   PT/INR No results for input(s): LABPROT, INR in the last 72 hours. Comprehensive Metabolic Panel:    Component Value Date/Time   NA 130 (L) 07/13/2017 0539   NA 128 (L) 07/12/2017 0855   K 4.0 07/13/2017 0539   K 4.1 07/12/2017 0855   CL 98 (L) 07/13/2017 0539   CL 99 (L) 07/12/2017 0855   CO2 25 07/13/2017 0539   CO2 22 07/12/2017 0855   BUN 9 07/13/2017 0539   BUN 5 (L) 07/12/2017 0855   CREATININE 0.58 07/13/2017 0539   CREATININE 0.72 07/12/2017 0855   GLUCOSE 109 (H) 07/13/2017 0539   GLUCOSE  122 (H) 07/12/2017 0855   CALCIUM 8.6 (L) 07/13/2017 0539   CALCIUM 8.7 (L) 07/12/2017 0855   AST 40 07/12/2017 0855   AST 18 03/05/2017 0515   ALT 55 (H) 07/12/2017 0855   ALT 12 (L) 03/05/2017 0515   ALKPHOS 71 07/12/2017 0855   ALKPHOS 38 03/05/2017 0515   BILITOT 1.2 07/12/2017 0855   BILITOT 0.6 03/05/2017 0515   PROT 5.9 (L) 07/12/2017 0855   PROT 5.3 (L) 03/05/2017 0515   ALBUMIN 2.8 (L) 07/12/2017 0855   ALBUMIN 2.9 (L) 03/05/2017 0515     Studies/Results: No results found.  Anti-infectives: Anti-infectives    Start     Dose/Rate Route Frequency Ordered Stop   07/09/17 0759  cefoTEtan in Dextrose 5% (CEFOTAN) IVPB 2 g     2 g Intravenous On call to O.R. 07/09/17 0759 07/09/17 1003       Emmalea Treanor J 07/14/2017

## 2017-07-14 NOTE — H&P (Signed)
Sara Davila 06/25/2017 1:47 PM Location: [facility] Patient #: [id number] DOB: 10/14/1936 Married / Language: Sara Davila / Race: White Female   History of Present Illness Odis Hollingshead MD; 06/25/2017 2:44 PM) The patient is a 83 year old female.  Note:She is here with her husband for a follow up visit. She is feeling well and is back to baseline. No abdominal complaints.  Allergies (April Staton, CMA; 06/25/2017 1:47 PM) No Known Drug Allergies 04/20/2017 Allergies Reconciled   Medication History (April Staton, CMA; 06/25/2017 1:48 PM) MetFORMIN HCl (500MG  Tablet, Oral) Active. Tolterodine Tartrate ER (4MG  Capsule ER 24HR, Oral) Active. Aspirin (81MG  Capsule, Oral) Active. Synthroid (75MCG Tablet, Oral) Active. Cyanocobalamin (Oral) Specific strength unknown - Active. Ensure Complete Shake (Oral) Specific strength unknown - Active. Metoprolol Tartrate (25MG  Tablet, Oral) Active. Plavix (75MG  Tablet, Oral) Active. Nitroglycerin (Sublingual) Specific strength unknown - Active. Protonix (40MG  Tablet DR, Oral) Active. Medications Reconciled  Vitals (April Staton CMA; 06/25/2017 1:49 PM) 06/25/2017 1:48 PM Weight: 125 lb Height: 60in Body Surface Area: 1.53 m Body Mass Index: 24.41 kg/m  Temp.: 99.67F(Oral)  Pulse: 88 (Regular)  BP: 138/80 (Sitting, Left Arm, Standard)       Physical Exam Odis Hollingshead MD; 06/25/2017 2:47 PM) The physical exam findings are as follows: Note:GENERAL APPEARANCE: Elderly in NAD. Pleasant and cooperative.  EARS, NOSE, MOUTH THROAT: [place]/AT external ears: no lesions or deformities external nose: no lesions or deformities hearing: grossly normal lips: moist, no deformities EYES external: conjunctiva, lids, sclerae normal pupils: equal, round  CV ascultation: RRR,  RESP/CHEST auscultation: breath sounds equal and clear respiratory effort: normal  GASTROINTESTINAL abdomen: Soft,  non-tender, non-distended, no masses liver and spleen: not enlarged. hernia: none present scar: none present  SKIN jaundice: none  NEUROLOGIC speech: normal  PSYCHIATRIC alertness and orientation: normal mood/affect/behavior: normal judgement and insight: normal    Assessment & Plan Odis Hollingshead MD; 06/25/2017 2:43 PM) CALCULUS OF GALLBLADDER WITH ACUTE CHOLECYSTITIS WITHOUT OBSTRUCTION (K80.00) Impression: She feels back to her baseline. She has no symptoms. We discussed options included cholecystectomy and non-operative management. She would need to be off her Aspirin and Plavix for 5 days. As for the 4 cm adrenal adenoma, I feel she should have a repeat CT 6 months after the previous CT and if the adenoma gets larger, work up could be considered.  I have explained the procedure, risks, and aftercare of cholecystectomy. Risks include but are not limited to bleeding, infection, wound problems, anesthesia, diarrhea, bile leak, injury to common bile duct/liver/intestine. She and her husband would like to think about things and will call us back if she wants to proceed with surgery.  Jackolyn Confer, M.D.

## 2017-07-14 NOTE — Clinical Social Work Placement (Signed)
   CLINICAL SOCIAL WORK PLACEMENT  NOTE  Date:  07/14/2017  Patient Details  Name: Sara Davila MRN: [id number] Date of Birth: [birth year]  Clinical Social Work is seeking post-discharge placement for this patient at the [place] level of care (*CSW will initial, date and re-position this form in  chart as items are completed):  Yes   Patient/family provided with [facility] Work Department's list of facilities offering this level of care within the geographic area requested by the patient (or if unable, by the patient's family).  Yes   Patient/family informed of their freedom to choose among providers that offer the needed level of care, that participate in Medicare, Medicaid or managed care program needed by the patient, have an available bed and are willing to accept the patient.  Yes   Patient/family informed of [facility]'s ownership interest in [facility] and [facility], as well as of the fact that they are under no obligation to receive care at these facilities.  PASRR submitted to EDS on 07/14/17     PASRR number received on 07/14/17     Existing PASRR number confirmed on       FL2 transmitted to all facilities in geographic area requested by pt/family on 07/14/17     FL2 transmitted to all facilities within larger geographic area on       Patient informed that his/her managed care company has contracts with or will negotiate with certain facilities, including the following:        Yes   Patient/family informed of bed offers received.  Patient chooses bed at  [facility])     Physician recommends and patient chooses bed at      Patient to be transferred to  ([facility] via facility transport) on 07/14/17.  Patient to be transferred to facility by  [facility] will pick up pt)     Patient family notified on 07/14/17 of transfer.  Name of family member notified:  Dtr-In-Law      PHYSICIAN Please sign FL2     Additional Comment:    _______________________________________________ Serafina Mitchell, [place] 07/14/2017, 11:09 AM

## 2017-07-15 ENCOUNTER — Encounter: Payer: Self-pay | Admitting: Internal Medicine

## 2017-07-15 ENCOUNTER — Non-Acute Institutional Stay (SKILLED_NURSING_FACILITY): Payer: Medicare Other | Admitting: Internal Medicine

## 2017-07-15 DIAGNOSIS — R682 Dry mouth, unspecified: Secondary | ICD-10-CM

## 2017-07-15 DIAGNOSIS — R531 Weakness: Secondary | ICD-10-CM

## 2017-07-15 DIAGNOSIS — E43 Unspecified severe protein-calorie malnutrition: Secondary | ICD-10-CM | POA: Diagnosis not present

## 2017-07-15 DIAGNOSIS — F419 Anxiety disorder, unspecified: Secondary | ICD-10-CM | POA: Diagnosis not present

## 2017-07-15 DIAGNOSIS — M270 Developmental disorders of jaws: Secondary | ICD-10-CM | POA: Diagnosis not present

## 2017-07-15 DIAGNOSIS — K219 Gastro-esophageal reflux disease without esophagitis: Secondary | ICD-10-CM

## 2017-07-15 DIAGNOSIS — E871 Hypo-osmolality and hyponatremia: Secondary | ICD-10-CM | POA: Diagnosis not present

## 2017-07-15 DIAGNOSIS — Z9049 Acquired absence of other specified parts of digestive tract: Secondary | ICD-10-CM

## 2017-07-15 DIAGNOSIS — G4700 Insomnia, unspecified: Secondary | ICD-10-CM | POA: Diagnosis not present

## 2017-07-15 DIAGNOSIS — E039 Hypothyroidism, unspecified: Secondary | ICD-10-CM | POA: Diagnosis not present

## 2017-07-15 NOTE — Progress Notes (Signed)
Provider:  Blanchie Serve MD  Location:  [facility] Room Number: 31 Place of Service:  SNF (31)  PCP: Lawerance Cruel, MD Patient Care Team: Lawerance Cruel, MD as PCP - General (Family Medicine) Mast, Man X, NP as Nurse Practitioner (Internal Medicine)  Extended Emergency Contact Information Primary Emergency Contact: Milon Dikes [place] Phone: [phone] Mobile Phone: [phone] Relation: Friend Secondary Emergency Contact: Rosina Lowenstein [place] Phone: [phone] Relation: Daughter  Code Status: Full Code Goals of Care: Advanced Directive information Advanced Directives 07/09/2017  Does Patient Have a Medical Advance Directive? Yes  Type of Paramedic of [place];Living will  Does patient want to make changes to medical advance directive? No - Patient declined  Copy of [place] in Chart? Yes  Would patient like information on creating a medical advance directive? -  Pre-existing out of facility DNR order (yellow form or pink MOST form) -      Chief Complaint  Patient presents with  . New Admit To SNF    New Admission Visit     HPI: Patient is a 83 y.o. female seen today for admission visit. She was in the hospital from 07/09/17-07/14/17 with acute on chronic abdominal pain from diverticulitis of small intestine with perforated jejunum. She underwent exploratory laprotomy with small bowel resection on 07/09/17. Post operatively, she had hyponatremia, post op ileus and anxiety. She was deconditioned and was evaluated by therapy team. She is here for rehabilitation. She is seen in her room today. She complaints of abdominal soreness and pain medication is helping her. She is able to keep oral intake down. Denies any nausea or vomiting. She will be evaluated by therapy team for rehabilitation.   Past Medical History:  Diagnosis Date  . Anxiety   .  Arthritis   . Cervical vertebral fusion   . Diverticulosis   . Fibromyalgia   . GERD (gastroesophageal reflux disease)   . Hypothyroidism   . Pneumonia   . Pneumoperitoneum 02/26/2017   Past Surgical History:  Procedure Laterality Date  . ABDOMINAL HYSTERECTOMY    . BOWEL RESECTION N/A 07/09/2017   Procedure: SMALL BOWEL RESECTION;  Surgeon: Johnathan Hausen, MD;  Location: [facility];  Service: General;  Laterality: N/A;  . CERVICAL FUSION     x2  . LAPAROSCOPIC APPENDECTOMY N/A 03/04/2017   Procedure: APPENDECTOMY LAPAROSCOPIC;  Surgeon: Johnathan Hausen, MD;  Location: [facility];  Service: General;  Laterality: N/A;  . LAPAROSCOPY N/A 03/04/2017   Procedure: LAPAROSCOPY DIAGNOSTIC, ENTEROLYSIS;  Surgeon: Johnathan Hausen, MD;  Location: [facility];  Service: General;  Laterality: N/A;  . LAPAROTOMY N/A 07/09/2017   Procedure: EXPLORATORY LAPAROTOMY;  Surgeon: Johnathan Hausen, MD;  Location: [facility];  Service: General;  Laterality: N/A;  . SPINE SURGERY     Lumbar- rod placement  . TONSILLECTOMY     83y/o  . TOTAL VAGINAL HYSTERECTOMY      reports that she has quit smoking. She has never used smokeless tobacco. She reports that she does not drink alcohol or use drugs. Social History   Social History  . Marital status: Widowed    Spouse name: N/A  . Number of children: 2  . Years of education: RN   Occupational History  . Retired     Social History Main Topics  . Smoking status: Former Research scientist (life sciences)  . Smokeless tobacco: Never Used     Comment: Smoked from age [age]  .  Alcohol use No  . Drug use: No  . Sexual activity: Not Currently   Other Topics Concern  . Not on file   Social History Narrative   Lives at [facility]    Caffeine use: none    Functional Status Survey:    Family History  Problem Relation Age of Onset  . Neuropathy Neg Hx     Health Maintenance  Topic Date Due  . TETANUS/TDAP  10/26/1951  . DEXA SCAN  10/25/1997  . PNA vac Low Risk Adult (1 of 2 -  PCV13) 10/25/1997  . INFLUENZA VACCINE  06/30/2017    Allergies  Allergen Reactions  . Codeine Rash  . Penicillins Rash    Has patient had a PCN reaction causing immediate rash, facial/tongue/throat swelling, SOB or lightheadedness with hypotension: No Has patient had a PCN reaction causing severe rash involving mucus membranes or skin necrosis: No Has patient had a PCN reaction that required hospitalization: No Has patient had a PCN reaction occurring within the last 10 years: No If all of the above answers are "NO", then may proceed with Cephalosporin use.     Outpatient Encounter Prescriptions as of 07/15/2017  Medication Sig  . acetaminophen (TYLENOL) 500 MG tablet Take 1 tablet (500 mg total) by mouth every 6 (six) hours as needed for moderate pain or fever.  . Calcium-Magnesium-Vitamin D (CALCIUM 500 PO) Take 500 mg by mouth 2 (two) times daily.  . Carboxymethylcellulose Sodium (THERATEARS OP) Apply 1 drop to eye daily.   . Cholecalciferol (VITAMIN D) 2000 units tablet Take 2,000 Units by mouth daily.  . Cranberry 1000 MG CAPS Take 1,000 mg by mouth 2 (two) times daily.  Marland Kitchen DM-Benzocaine-Menthol (CHLORASEPTIC TOTAL MT) Use as directed 1 lozenge in the mouth or throat every 2 (two) hours as needed.  Marland Kitchen GARLIC PO Take 1 tablet by mouth every evening.   Marland Kitchen levothyroxine (SYNTHROID, LEVOTHROID) 75 MCG tablet Take 75 mcg by mouth daily before breakfast.   . LORazepam (ATIVAN) 0.5 MG tablet Take 0.25 mg by mouth at bedtime as needed for anxiety or sleep.   . Multiple Vitamins-Minerals (OCUVITE PO) Take 1 tablet by mouth daily.  Marland Kitchen omeprazole (PRILOSEC) 20 MG capsule Take 20 mg by mouth every evening.   . saccharomyces boulardii (FLORASTOR) 250 MG capsule Take 250 mg by mouth daily.  . traMADol (ULTRAM) 50 MG tablet Take 1 tablet (50 mg total) by mouth every 6 (six) hours as needed for moderate pain.  Marland Kitchen zolpidem (AMBIEN) 5 MG tablet Take 5 mg by mouth at bedtime as needed for sleep.  .  [DISCONTINUED] Probiotic Product (PROBIOTIC-10) CAPS Take 1 capsule by mouth daily.  . [DISCONTINUED] zolpidem (AMBIEN) 10 MG tablet Take 5-10 mg by mouth at bedtime as needed for sleep.   No facility-administered encounter medications on file as of 07/15/2017.     Review of Systems  Constitutional: Positive for appetite change and fatigue. Negative for chills, diaphoresis and fever.       Poor appetite.   HENT: Negative for congestion, hearing loss, mouth sores, postnasal drip, sinus pressure, sore throat and trouble swallowing.        Dry mouth  Eyes:       Wears glasses   Respiratory: Negative for cough, shortness of breath and wheezing.   Cardiovascular: Positive for leg swelling. Negative for chest pain and palpitations.  Gastrointestinal: Positive for abdominal pain. Negative for blood in stool, constipation, diarrhea, nausea and vomiting.  Appetite is improving. Last bowel movement was this morning. Soft regular stool.  No blood.    Genitourinary: Negative for dysuria, frequency and hematuria.  Musculoskeletal: Negative for back pain and gait problem.  Neurological: Positive for dizziness and weakness. Negative for syncope, numbness and headaches.       With change of position  Hematological: Bruises/bleeds easily.  Psychiatric/Behavioral: Negative for behavioral problems, confusion and sleep disturbance.    Vitals:   07/15/17 0936  BP: 126/70  Pulse: 66  Resp: 18  Temp: 98 F (36.7 C)  TempSrc: Oral  SpO2: 96%  Weight: 91 lb (41.3 kg)  Height: 5' (1.524 m)   Body mass index is 17.77 kg/m. Physical Exam  Constitutional: She is oriented to person, place, and time. No distress.  Frail, elderly female, in no acute distress, thin built  HENT:  Head: Normocephalic and atraumatic.  Nose: Nose normal.  Mouth/Throat: Oropharynx is clear and moist. No oropharyngeal exudate.  Palpable mass to hard palate, non tender, no signs of infection  Eyes: Pupils are equal,  round, and reactive to light. Conjunctivae and EOM are normal. Right eye exhibits no discharge. Left eye exhibits no discharge. No scleral icterus.  Neck: Normal range of motion. Neck supple. No JVD present. No thyromegaly present.  Cardiovascular: Normal rate and regular rhythm.   No murmur heard. Pulmonary/Chest: Effort normal and breath sounds normal. No respiratory distress. She has no wheezes. She has no rales. She exhibits no tenderness.  Abdominal: Soft. Bowel sounds are normal. She exhibits distension. There is no tenderness. There is no rebound and no guarding.  Musculoskeletal: She exhibits edema. She exhibits no tenderness.  Can move all 4 extremities, arthritis changes to fingers, generalized weakness, trace leg edema  Lymphadenopathy:    She has no cervical adenopathy.  Neurological: She is alert and oriented to person, place, and time.  Skin: Skin is warm and dry. She is not diaphoretic.  Surgical incision to mid abdominal wall with 9 staples, open to air, mild erythema at staple site, no drainage. Blanchable redness to both heels  Psychiatric: She has a normal mood and affect. Her behavior is normal. Thought content normal.    Labs reviewed: Basic Metabolic Panel:  Recent Labs  07/11/17 0533 07/12/17 0855 07/13/17 0539  NA 126* 128* 130*  K 4.2 4.1 4.0  CL 100* 99* 98*  CO2 21* 22 25  GLUCOSE 99 122* 109*  BUN 7 5* 9  CREATININE 0.66 0.72 0.58  CALCIUM 7.8* 8.7* 8.6*   Liver Function Tests:  Recent Labs  02/26/17 1314 03/05/17 0515 07/12/17 0855  AST 29 18 40  ALT 20 12* 55*  ALKPHOS 50 38 71  BILITOT 0.6 0.6 1.2  PROT 7.4 5.3* 5.9*  ALBUMIN 4.4 2.9* 2.8*    Recent Labs  02/26/17 1314  LIPASE 21   No results for input(s): AMMONIA in the last 8760 hours. CBC:  Recent Labs  07/10/17 0530 07/11/17 0533 07/12/17 0855  WBC 8.3 9.3 9.8  NEUTROABS  --   --  7.7  HGB 11.4* 10.6* 12.0  HCT 33.4* 30.0* 34.3*  MCV 94.6 91.7 91.7  PLT 406* 414*  441*   Cardiac Enzymes: No results for input(s): CKTOTAL, CKMB, CKMBINDEX, TROPONINI in the last 8760 hours. BNP: Invalid input(s): POCBNP No results found for: HGBA1C No results found for: TSH Lab Results  Component Value Date   VITAMINB12 859 08/31/2016   Lab Results  Component Value Date   FOLATE 20.7 08/31/2016  No results found for: IRON, TIBC, FERRITIN  Imaging and Procedures obtained prior to SNF admission: No results found.  Assessment/Plan  Generalized weakness From deconditioning with her recent surgery. Here for rehabilitation. Will have her work with physical therapy and occupational therapy team to help with gait training and muscle strengthening exercises.fall precautions. Skin care. Encourage to be out of bed.   Intestinal perforation s/p bowel resection S/p exploratory laprotomy with small bowel resection on 07/09/17. Will need follow up with general surgery. Monitor incision site for infection. Tolerating po intake well and has bowel movement. Monitor her po intake and bowel movement. Continue tramadol 50 mg q6h prn and tylenol 500 mg q6h prn pain. Avoid straining and sudden bending, heavy lifting to avoid increase in intra-abdominal pressure. Continue florastor supplement  Hyponatremia Check bmp, maintain hydration  Dry mouth Add biotene losenges for now and monitor. Maintain hydration  Severe protein calorie malnutrition RD consult. Monitor weekly weight. 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   Hypothyroidism No results found for: TSH Continue levothyroxine 75 mcg daily and monitor, check tsh periodically  Insomnia Sleeping well, continue zolpidem 5 mg qhs prn  gerd Denies heartburn. Continue omeprazole 20 mg daily  Patient does not want her garlic and cranberry supplement. Discontinue them.    Family/ staff Communication: reviewed care  plan with patient and charge nurse.    Labs/tests ordered: cbc, cmp    Blanchie Serve, MD Internal Medicine [facility] [address] [place], [place] [address] Cell Phone (Monday-Friday 8 am - 5 pm): [phone] On Call: [phone] and follow prompts after 5 pm and on weekends Office Phone: [phone] Office Fax: [phone]

## 2017-07-21 ENCOUNTER — Non-Acute Institutional Stay (SKILLED_NURSING_FACILITY): Payer: Medicare Other | Admitting: Nurse Practitioner

## 2017-07-21 ENCOUNTER — Encounter: Payer: Self-pay | Admitting: Nurse Practitioner

## 2017-07-21 DIAGNOSIS — F5105 Insomnia due to other mental disorder: Secondary | ICD-10-CM | POA: Diagnosis not present

## 2017-07-21 DIAGNOSIS — Z9049 Acquired absence of other specified parts of digestive tract: Secondary | ICD-10-CM | POA: Diagnosis not present

## 2017-07-21 DIAGNOSIS — E032 Hypothyroidism due to medicaments and other exogenous substances: Secondary | ICD-10-CM

## 2017-07-21 DIAGNOSIS — F419 Anxiety disorder, unspecified: Secondary | ICD-10-CM | POA: Diagnosis not present

## 2017-07-21 DIAGNOSIS — E871 Hypo-osmolality and hyponatremia: Secondary | ICD-10-CM | POA: Insufficient documentation

## 2017-07-21 DIAGNOSIS — K219 Gastro-esophageal reflux disease without esophagitis: Secondary | ICD-10-CM | POA: Insufficient documentation

## 2017-07-21 NOTE — Assessment & Plan Note (Addendum)
Taking Levothyroxine 54mcg po qd, TSH 6.87, will increase Levothyroxine to 134mcg/75mcg po daily, f/u TSH in 6 weeks.

## 2017-07-21 NOTE — Assessment & Plan Note (Signed)
last Na was 130 07/13/17, prior Na 126 07/11/17, continue to observe.

## 2017-07-21 NOTE — Progress Notes (Signed)
Location:  [facility] Room Number: 81 Place of Service:  SNF (31)  Provider: Marlana Latus  NP  PCP: Lawerance Cruel, MD Patient Care Team: Lawerance Cruel, MD as PCP - General (Family Medicine)  Extended Emergency Contact Information Primary Emergency Contact: Milon Dikes [place] of [place] Phone: [phone] Mobile Phone: [phone] Relation: Friend Secondary Emergency Contact: Rosina Lowenstein [place] of [place] Phone: [phone] Relation: Daughter  Code Status: Full Code Goals of care:  Advanced Directive information Advanced Directives 07/21/2017  Does Patient Have a Medical Advance Directive? Yes  Type of Paramedic of [place];Living will  Does patient want to make changes to medical advance directive? No - Patient declined  Copy of [place] in Chart? Yes  Would patient like information on creating a medical advance directive? -  Pre-existing out of facility DNR order (yellow form or pink MOST form) -     Allergies  Allergen Reactions  . Codeine Rash  . Penicillins Rash    Has patient had a PCN reaction causing immediate rash, facial/tongue/throat swelling, SOB or lightheadedness with hypotension: No Has patient had a PCN reaction causing severe rash involving mucus membranes or skin necrosis: No Has patient had a PCN reaction that required hospitalization: No Has patient had a PCN reaction occurring within the last 10 years: No If all of the above answers are "NO", then may proceed with Cephalosporin use.     Chief Complaint  Patient presents with  . Discharge Note    HPI:  83 y.o. 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 She is healing well, staples were removed 07/21/17. She is regained her physical  strength, able to return to [place]  She had hyponatremia, last Na was 130 07/13/17, prior Na 126 07/11/17,  anxiety disorder, takes Zolpidem 5mg  hs prn, Lorazepam 0.5mg  qd prn, GERD, stable, taking Omeprazole 20mg  qd, Hypothyroidism taking Levothyroxine 56mcg qd, no recent TSH  Past Medical History:  Diagnosis Date  . Anxiety   . Arthritis   . Cervical vertebral fusion   . Diverticulosis   . Fibromyalgia   . GERD (gastroesophageal reflux disease)   . Hypothyroidism   . Pneumonia   . Pneumoperitoneum 02/26/2017    Past Surgical History:  Procedure Laterality Date  . ABDOMINAL HYSTERECTOMY    . BOWEL RESECTION N/A 07/09/2017   Procedure: SMALL BOWEL RESECTION;  Surgeon: Johnathan Hausen, MD;  [facility];  Service: General;  Laterality: N/A;  . CERVICAL FUSION     x2  . LAPAROSCOPIC APPENDECTOMY N/A 03/04/2017   Procedure: APPENDECTOMY LAPAROSCOPIC;  Surgeon: Johnathan Hausen, MD;  [facility];  Service: General;  Laterality: N/A;  . LAPAROSCOPY N/A 03/04/2017   Procedure: LAPAROSCOPY DIAGNOSTIC, ENTEROLYSIS;  Surgeon: Johnathan Hausen, MD;  [facility];  Service: General;  Laterality: N/A;  . LAPAROTOMY N/A 07/09/2017   Procedure: EXPLORATORY LAPAROTOMY;  Surgeon: Johnathan Hausen, MD;  [facility];  Service: General;  Laterality: N/A;  . SPINE SURGERY     Lumbar- rod placement  . TONSILLECTOMY     83y/o  . TOTAL VAGINAL HYSTERECTOMY        reports that she has quit smoking. She has never used smokeless tobacco. She reports that she does not drink alcohol or use drugs. Social History   Social History  .  Marital status: Widowed    Spouse name: N/A  . Number of children: 2  . Years of education: RN   Occupational History  . Retired     Social History Main Topics  . Smoking status: Former Research scientist (life sciences)  . Smokeless tobacco: Never Used     Comment: Smoked from age [age]  . Alcohol use No  . Drug use: No  . Sexual activity: Not Currently   Other Topics Concern  . Not  on file   Social History Narrative   Lives at [facility]    Caffeine use: none   Functional Status Survey:    Allergies  Allergen Reactions  . Codeine Rash  . Penicillins Rash    Has patient had a PCN reaction causing immediate rash, facial/tongue/throat swelling, SOB or lightheadedness with hypotension: No Has patient had a PCN reaction causing severe rash involving mucus membranes or skin necrosis: No Has patient had a PCN reaction that required hospitalization: No Has patient had a PCN reaction occurring within the last 10 years: No If all of the above answers are "NO", then may proceed with Cephalosporin use.     Pertinent  Health Maintenance Due  Topic Date Due  . DEXA SCAN  10/25/1997  . PNA vac Low Risk Adult (1 of 2 - PCV13) 10/25/1997  . INFLUENZA VACCINE  06/30/2017    Medications: Outpatient Encounter Prescriptions as of 07/21/2017  Medication Sig  . acetaminophen (TYLENOL) 500 MG tablet Take 1 tablet (500 mg total) by mouth every 6 (six) hours as needed for moderate pain or fever.  . Calcium-Magnesium-Vitamin D (CALCIUM 500 PO) Take 500 mg by mouth 2 (two) times daily.  . Carboxymethylcellulose Sodium (THERATEARS OP) Apply 1 drop to eye daily.   . Cholecalciferol (VITAMIN D) 2000 units tablet Take 2,000 Units by mouth daily.  Marland Kitchen levothyroxine (SYNTHROID, LEVOTHROID) 75 MCG tablet Take 75 mcg by mouth daily before breakfast.   . LORazepam (ATIVAN) 0.5 MG tablet Take 0.25 mg by mouth at bedtime as needed for anxiety or sleep.   . Multiple Vitamins-Minerals (OCUVITE PO) Take 1 tablet by mouth daily.  Marland Kitchen omeprazole (PRILOSEC) 20 MG capsule Take 20 mg by mouth every evening.   . saccharomyces boulardii (FLORASTOR) 250 MG capsule Take 250 mg by mouth daily.  . traMADol (ULTRAM) 50 MG tablet Take 1 tablet (50 mg total) by mouth every 6 (six) hours as needed for moderate pain.  Marland Kitchen zolpidem (AMBIEN) 5 MG tablet Take 5 mg by mouth at bedtime as needed for sleep.  .  [DISCONTINUED] Cranberry 1000 MG CAPS Take 1,000 mg by mouth 2 (two) times daily.  . [DISCONTINUED] DM-Benzocaine-Menthol (CHLORASEPTIC TOTAL MT) Use as directed 1 lozenge in the mouth or throat every 2 (two) hours as needed.  . [DISCONTINUED] GARLIC PO Take 1 tablet by mouth every evening.    No facility-administered encounter medications on file as of 07/21/2017.     Review of Systems  Constitutional: Negative for activity change, appetite change, chills, diaphoresis, fatigue and fever.       Poor appetite.   HENT: Negative for congestion, hearing loss and mouth sores.        Dry mouth  Eyes: Negative for pain and itching.       Wears glasses   Respiratory: Negative for cough and shortness of breath.   Cardiovascular: Negative for chest pain, palpitations and leg swelling.  Gastrointestinal: Negative for abdominal pain, blood in stool, constipation, diarrhea, nausea and vomiting.  Complaining sore in her abdomen when bending over.    Genitourinary: Negative for dysuria, frequency and hematuria.  Musculoskeletal: Negative for back pain and gait problem.  Skin:       Mid abdominal surgical incision is healed. 9 staples removed today.   Neurological: Negative for dizziness, numbness and headaches.  Hematological: Does not bruise/bleed easily.  Psychiatric/Behavioral: Negative for behavioral problems, confusion and sleep disturbance.       Takes Ambien almost nightly    Vitals:   07/21/17 1209  BP: 108/60  Pulse: 72  Resp: 18  Temp: 99.1 F (37.3 C)  Weight: 90 lb 6.4 oz (41 kg)  Height: 5' (1.524 m)   Body mass index is 17.66 kg/m. Physical Exam  Constitutional: She is oriented to person, place, and time. She appears well-developed and well-nourished.  Frail, elderly female, in no acute distress, thin built  HENT:  Head: Normocephalic and atraumatic.  Eyes: Pupils are equal, round, and reactive to light. Conjunctivae and EOM are normal.  Neck: Normal range of motion.  Neck supple.  Cardiovascular: Normal rate and regular rhythm.   No murmur heard. Pulmonary/Chest: Effort normal and breath sounds normal. She has no rales.  Abdominal: Soft. Bowel sounds are normal. She exhibits no distension. There is no tenderness. There is no rebound and no guarding.  Soreness when bend over  Musculoskeletal: She exhibits no edema or tenderness.  Neurological: She is alert and oriented to person, place, and time.  Skin: Skin is warm and dry.  Surgical incision @ mid abdominal wall is healed.   Psychiatric: She has a normal mood and affect. Her behavior is normal. Thought content normal.    Labs reviewed: Basic Metabolic Panel:  Recent Labs  07/11/17 0533 07/12/17 0855 07/13/17 0539  NA 126* 128* 130*  K 4.2 4.1 4.0  CL 100* 99* 98*  CO2 21* 22 25  GLUCOSE 99 122* 109*  BUN 7 5* 9  CREATININE 0.66 0.72 0.58  CALCIUM 7.8* 8.7* 8.6*   Liver Function Tests:  Recent Labs  02/26/17 1314 03/05/17 0515 07/12/17 0855  AST 29 18 40  ALT 20 12* 55*  ALKPHOS 50 38 71  BILITOT 0.6 0.6 1.2  PROT 7.4 5.3* 5.9*  ALBUMIN 4.4 2.9* 2.8*    Recent Labs  02/26/17 1314  LIPASE 21   No results for input(s): AMMONIA in the last 8760 hours. CBC:  Recent Labs  07/10/17 0530 07/11/17 0533 07/12/17 0855  WBC 8.3 9.3 9.8  NEUTROABS  --   --  7.7  HGB 11.4* 10.6* 12.0  HCT 33.4* 30.0* 34.3*  MCV 94.6 91.7 91.7  PLT 406* 414* 441*   Cardiac Enzymes: No results for input(s): CKTOTAL, CKMB, CKMBINDEX, TROPONINI in the last 8760 hours. BNP: Invalid input(s): POCBNP CBG: No results for input(s): GLUCAP in the last 8760 hours.  Procedures and Imaging Studies During Stay: Dg Inject Diag/thera/inc Needle/cath/plc Epi/cerv/thor W/img  Result Date: 06/23/2017 CLINICAL DATA:  Cervical spondylosis without myelopathy. LEFT C8 radiculopathy. Previous cervical fusions. FLUOROSCOPY TIME:  23 seconds corresponding to a Dose Area Product of 7.4 Gy*m2 PROCEDURE: I  elected to perform the procedure at the dorsal epidural space adjacent to T1-T2, as the anterolisthesis at C7-T1 did not allow sufficient interlaminar distance for safe performance. CERVICOTHORACIC EPIDURAL INJECTION An interlaminar approach was performed on the LEFT at T1-T2. A 20 gauge epidural needle was advanced using loss-of-resistance technique. DIAGNOSTIC EPIDURAL INJECTION Injection of Isovue-M 300 shows a good epidural pattern with spread above  and below the level of needle placement, primarily on the LEFT. No vascular opacification is seen. THERAPEUTIC  EPIDURAL INJECTION 1.5 ml of Kenalog 40 mixed with 1 ml of 1% Lidocaine and 2 ml of normal saline were then instilled. The procedure was well-tolerated, and the patient was discharged thirty minutes following the injection in good condition. IMPRESSION: Technically successful first epidural injection on the LEFT at T1-T2. Electronically Signed   By: Staci Righter M.D.   On: 06/23/2017 14:04    Assessment/Plan:   Hypothyroidism Taking Levothyroxine 65mcg po qd, TSH 6.87, will increase Levothyroxine to 131mcg/75mcg po daily, f/u TSH in 6 weeks.   Hyponatremia last Na was 130 07/13/17, prior Na 126 07/11/17, continue to observe.   Insomnia secondary to anxiety Stable, takes Zolpidem 5mg  hs prn, Lorazepam 0.5mg  qd prn,   GERD (gastroesophageal reflux disease) table, taking Omeprazole 20mg  qd  S/P small bowel resection Healed, discharge [place] [facility] 07/22/17     Patient is being discharged with the following home health services:    Patient is being discharged with the following durable medical equipment:    Patient has been advised to f/u with their PCP in 1-2 weeks to for a transitions of care visit.  Social services at their facility was responsible for arranging this appointment.  Pt was provided with adequate prescriptions of noncontrolled medications to reach the scheduled appointment .  For controlled substances, a limited supply was  provided as appropriate for the individual patient.  If the pt normally receives these medications from a pain clinic or has a contract with another physician, these medications should be received from that clinic or physician only).    Future labs/tests needed:  TSH in 6 weeks.   Time spend 45 minutes

## 2017-07-21 NOTE — Assessment & Plan Note (Signed)
Healed, discharge [place] [facility] 07/22/17

## 2017-07-21 NOTE — Assessment & Plan Note (Signed)
Stable, takes Zolpidem 5mg  hs prn, Lorazepam 0.5mg  qd prn,

## 2017-07-21 NOTE — Assessment & Plan Note (Signed)
table, taking Omeprazole 20mg  qd

## 2017-07-23 NOTE — Patient Instructions (Signed)
Increased Levothyroxine 144mcg/75mcg po daily, last TSH 6.87 07/23/17, f/u TSH 6 weeks.

## 2017-12-01 ENCOUNTER — Telehealth: Payer: Self-pay | Admitting: *Deleted

## 2017-12-01 ENCOUNTER — Encounter: Payer: Self-pay | Admitting: Neurology

## 2017-12-01 ENCOUNTER — Ambulatory Visit (INDEPENDENT_AMBULATORY_CARE_PROVIDER_SITE_OTHER): Payer: Medicare Other | Admitting: Neurology

## 2017-12-01 VITALS — BP 130/73 | HR 79 | Ht 60.0 in | Wt 92.0 lb

## 2017-12-01 DIAGNOSIS — E538 Deficiency of other specified B group vitamins: Secondary | ICD-10-CM

## 2017-12-01 DIAGNOSIS — R270 Ataxia, unspecified: Secondary | ICD-10-CM

## 2017-12-01 DIAGNOSIS — G3184 Mild cognitive impairment, so stated: Secondary | ICD-10-CM | POA: Diagnosis not present

## 2017-12-01 DIAGNOSIS — R413 Other amnesia: Secondary | ICD-10-CM

## 2017-12-01 DIAGNOSIS — I677 Cerebral arteritis, not elsewhere classified: Secondary | ICD-10-CM

## 2017-12-01 NOTE — Telephone Encounter (Signed)
Patient states she received a MRI brain around April 2018 and believes it was at [facility]. Dr. Jaynee Eagles requests the CD & report. The patient has filled out a record release form and it has been sent to medical records for processing.

## 2017-12-01 NOTE — Patient Instructions (Addendum)
MRI brain Labs today Formal Neurocognitive testing with dr Si Raider

## 2017-12-01 NOTE — Telephone Encounter (Signed)
I called, [facility] requesting Cd/reports.

## 2017-12-01 NOTE — Progress Notes (Signed)
[facility]    Provider:  Dr Jaynee Eagles Referring Provider: Milagros Evener MD Primary Care Physician:  Milagros Evener MD  CC:  Cognitive decline  HPI:  VELEDA MUN is a 84 y.o. female here as a referral from Dr. Harrington Challenger for cognitive decline. a 84 y.o. female here as a referral from [place], MD for paresthesias. 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 Unclear, tapered off prednisone without any recurrence, fibromyalgia, osteoarthritis, vasculitis, gluten intolerance, tinnitus, hypothyroidism, sleep disorder unspecified,  paresthesias of both feet. She has had multiple surgeries and feels her gait has changed, she "goes to the side" sometimes. She had physical therapy about it, may be related to her surgeries. She doesn't feels herself since the surgeries. She is in independent living. She had anesthesia twice and has felt more foggy since then. She forgets movie starts names for example, and then may remember it. It was concerning she forgot her grandson's name. She does feel the memory changes started before the surgeries. Slowly worsening, very slowly progressing. She feels she may need assisted living soon. Her father had dementia that started in his mid 73s, mother "sharp as tack". She is having difficulty sleeping, stopped [place] about 45 days ago and is on Melatonin. She feels her mood is well controlled, she has a female friend and has companionship.   Reviewed notes, labs and imaging from outside physicians, which showed:   Reviewed primary care notes.  Patient concerned about memory, feels like her memory is getting fuzzy, at times harder to recall words and create sentences.  She has seen me in the past in 08/2016.  She is not getting confused or forgetting dates.  She feels her mood is good.  08/2016 for paresthesias:  KANITA DELAGE is a 84 y.o. female here as a referral from [place], MD for paresthesias. 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 Unclear, tapered off prednisone without any recurrence, fibromyalgia, osteoarthritis, vasculitis, gluten intolerance, tinnitus, hypothyroidism, sleep disorder unspecified,  paresthesias of both feet. She is on B12 supplements 1500  daily.She has numbness in digit #2 on both feet. Started out mild and then it got worse. Wearing a bandaid helps. Burning in one toe. The rest of the foot is unaffected. It involves the whole top (not bottom) of the toe to the base of the toe doesn't extend past the base into the foot. Even touching the sheet on the toe hurts. She has to keep something in the bed to stop it from touching the toe. She wakes up and her toe hurts. Started on the top of the toe in the middle just in the top after wearing tight shoes, no other toe affected. She goes barefoot a lot because it hurts. It is affecting her sleep. Symptoms are symmetric bilaterally. No cramping in the feet. No LBP or radicular symptoms. Started 2 years ago sporadically and slowly increased and now worse at night. Better with movement. She used to wear "wedge" shoes that pressed more on that toe. No other focal neurologic deficits or concerns. No other modifying factors or associated symptoms.  Reviewed notes, labs and imaging from outside physicians, which showed:  BUN 17, creatinine 0.660 Jayne 13th 2017, TSH 0.370 05/12/2016.  XR foot: personally reviewed imaging and agree with the following:  IMPRESSION: No  acute fracture or dislocation of the second toe is observed. No significant degenerative change is observed.    Review of Systems: Patient complains of symptoms per HPI as well as the following symptoms: eye pain, dizziness, decreased energy.  Pertinent negatives and positives per HPI. All others negative.   Social History   Socioeconomic History  . Marital status: Widowed    Spouse name: Not on file  . Number of children: 2  . Years of education: Therapist, sports  . Highest education level: Not on file  Social Needs  . Financial resource strain: Not on file  . Food insecurity - worry: Not on file  . Food insecurity - inability: Not on file  . Transportation needs - medical: Not on file  . Transportation needs - non-medical: Not on file  Occupational History  . Occupation: Retired   Tobacco Use  . Smoking status: Former Research scientist (life sciences)  . Smokeless tobacco: Never Used  . Tobacco comment: Smoked from age [age]  Substance and Sexual Activity  . Alcohol use: Yes    Comment: occasional glass of wine  . Drug use: No  . Sexual activity: Not Currently  Other Topics Concern  . Not on file  Social History Narrative   Lives at [facility]    Caffeine use: occasional cup of coffee   Left handed    Family History  Problem Relation Age of Onset  . Heart disease Father   . Stroke Father   . Stroke Paternal Uncle   . Breast cancer Maternal Aunt   . Neuropathy Neg Hx     Past Medical History:  Diagnosis Date  . Anxiety   . Arthritis   . Cervical vertebral fusion   . Diverticulosis   . Fibromyalgia   . GERD (gastroesophageal reflux disease)   . Hypothyroidism   . Pneumonia   . Pneumoperitoneum 02/26/2017    Past Surgical History:  Procedure Laterality Date  . ABDOMINAL HYSTERECTOMY    . BOWEL RESECTION N/A 07/09/2017   Procedure: SMALL BOWEL RESECTION;  Surgeon: Johnathan Hausen, MD;  [facility];  Service: General;  Laterality: N/A;  . CERVICAL FUSION     x2  . LAPAROSCOPIC APPENDECTOMY N/A 03/04/2017   Procedure: APPENDECTOMY LAPAROSCOPIC;  Surgeon: Johnathan Hausen, MD;  [facility];  Service: General;  Laterality: N/A;  . LAPAROSCOPY N/A 03/04/2017   Procedure: LAPAROSCOPY DIAGNOSTIC, ENTEROLYSIS;  Surgeon:  Johnathan Hausen, MD;  [facility];  Service: General;  Laterality: N/A;  . LAPAROTOMY N/A 07/09/2017   Procedure: EXPLORATORY LAPAROTOMY;  Surgeon: Johnathan Hausen, MD;  [facility];  Service: General;  Laterality: N/A;  . SPINE SURGERY     Lumbar- rod placement  . TONSILLECTOMY     84y/o  . TOTAL VAGINAL HYSTERECTOMY      Current Outpatient Medications  Medication Sig Dispense Refill  . acetaminophen (TYLENOL) 500 MG tablet Take 1 tablet (500 mg total) by mouth every 6 (six) hours as needed for moderate pain or fever. 30 tablet 0  . Calcium-Magnesium-Vitamin D (CALCIUM 500 PO) Take 500 mg by mouth 2 (two) times daily.    . Carboxymethylcellulose Sodium (THERATEARS OP) Apply 1 drop to eye daily.     . Cholecalciferol (VITAMIN D) 2000 units tablet Take 2,000 Units by mouth daily.    . furosemide (LASIX) 20 MG tablet Take 10 mg by mouth daily as needed for edema.    Marland Kitchen levothyroxine (SYNTHROID, LEVOTHROID) 75 MCG tablet Take 75 mcg by mouth daily before  breakfast.     . LORazepam (ATIVAN) 0.5 MG tablet Take 0.25 mg by mouth at bedtime as needed for anxiety or sleep.     . Melatonin 5 MG TABS Take 5 mg by mouth at bedtime.    . Multiple Vitamins-Minerals (OCUVITE PO) Take 1 tablet by mouth daily.    Marland Kitchen omeprazole (PRILOSEC) 20 MG capsule Take 20 mg by mouth daily as needed.     . saccharomyces boulardii (FLORASTOR) 250 MG capsule Take 250 mg by mouth daily.    . traMADol (ULTRAM) 50 MG tablet Take 1 tablet (50 mg total) by mouth every 6 (six) hours as needed for moderate pain. 30 tablet 0   No current facility-administered medications for this visit.     Allergies as of 12/01/2017 - Review Complete 12/01/2017  Allergen Reaction Noted  . Codeine Rash 03/25/2012  . Penicillins Rash 07/01/2017    Vitals: BP 130/73 (BP Location: Right Arm, Patient Position: Sitting)   Pulse 79   Ht 5' (1.524 m)   Wt 92 lb (41.7 kg) Comment: pt reports weighed herself this AM  BMI 17.97 kg/m    Last Weight:  Wt Readings from Last 1 Encounters:  12/01/17 92 lb (41.7 kg)   Last Height:   Ht Readings from Last 1 Encounters:  12/01/17 5' (1.524 m)    MMSE - Mini Mental State Exam 12/01/2017  Orientation to time 5  Orientation to Place 5  Registration 3  Attention/ Calculation 5  Recall 3  Language- name 2 objects 2  Language- repeat 1  Language- follow 3 step command 3  Language- read & follow direction 1  Write a sentence 1  Copy design 1  Total score 30      Assessment/Plan:   84 y.o. female here as a referral from Dr. Radene Ou for cognitive decline. 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 Progressive. Father with dementia.  Mini-Mental status exam is a 30 out of 30 however given patient's previous high functioning status, this does not rule out mild cognitive impairment in this patient.  Cannot rule out normal cognitive aging with resultant anesthesia effects from her surgery.  In any case feel as though formal neurocognitive testing is warranted and if a degenerative cognitive disorder is suspected we will start her on Aricept.  MRI brain to evaluate for reversible causes of dementia and MRI of the head given previous possible vasculitis and confusion. Labs today: B12 and folate and methylmalonic acid and homocysteine Patient wants to restart Lasix, Discuss with Dr. Radene Ou Given progressive memory loss, recommend formal neurocognitive testing (as discussed above) Will request MRi cervical spine results from [place] due to ataxia Discussed fall precautions, encouraged her to always use her walker (she is  not using it today).  Orders Placed This Encounter  Procedures  . MR BRAIN WO CONTRAST  . MR MRA HEAD WO CONTRAST  . B12 and Folate Panel  . Methylmalonic acid, serum  . Homocysteine  . RPR  . Ambulatory referral to Neuropsychology     Sarina Ill, MD  [facility] [address] [place], [place] [address]  Phone [phone] Fax [phone]  A total of 25 minutes was spent in with this patient face-to-face. Over half this time was  spent on counseling patient on the MCI diagnosis and different therapeutic options available.

## 2017-12-03 ENCOUNTER — Telehealth: Payer: Self-pay | Admitting: *Deleted

## 2017-12-03 ENCOUNTER — Encounter: Payer: Self-pay | Admitting: Psychology

## 2017-12-03 LAB — METHYLMALONIC ACID, SERUM: Methylmalonic Acid: 204 nmol/L (ref 0–378)

## 2017-12-03 LAB — B12 AND FOLATE PANEL
Folate: 19.8 ng/mL (ref >3.0)
Vitamin B-12: 760 pg/mL (ref 232–1245)

## 2017-12-03 LAB — HOMOCYSTEINE: Homocysteine: 12.6 umol/L (ref 0.0–15.0)

## 2017-12-03 LAB — RPR: RPR Ser Ql: NONREACTIVE

## 2017-12-03 NOTE — Telephone Encounter (Signed)
-----   Message from Melvenia Beam, MD sent at 12/02/2017  4:42 PM EST ----- Labs normal thanks

## 2017-12-03 NOTE — Telephone Encounter (Signed)
Tried to call pt again to give lab results. Number not a working number. Called Sara Davila (on DPR) and LVM asking for call back and that I was unable to reach [name]. I included that office is closed but on call MD would be available. Left office number to call.

## 2017-12-03 NOTE — Telephone Encounter (Signed)
Tried to call results. Number invalid. Will try again or see if DPR becomes available.

## 2017-12-06 NOTE — Telephone Encounter (Signed)
See other encounter for f/u

## 2017-12-06 NOTE — Telephone Encounter (Signed)
Returned daughters call Social worker) and stated labs are normal. She gave me a cell phone number for the patient as well and asked for me to call the patient.   I then tried pt's home # again and was able to reach the patient. I informed her that her labs are normal. Her questions were answered. She verbalized understanding and appreciation for the call and stated that her appt with Dr. Si Raider has been scheduled for 12/16/17.

## 2017-12-06 NOTE — Telephone Encounter (Signed)
Pt daughter in law(on DPR) has called back she is asking for a call back, she will be teaching from 2-5pm

## 2017-12-16 ENCOUNTER — Encounter: Payer: Self-pay | Admitting: Psychology

## 2017-12-16 ENCOUNTER — Ambulatory Visit (INDEPENDENT_AMBULATORY_CARE_PROVIDER_SITE_OTHER): Payer: Medicare Other | Admitting: Psychology

## 2017-12-16 DIAGNOSIS — F419 Anxiety disorder, unspecified: Secondary | ICD-10-CM

## 2017-12-16 DIAGNOSIS — R413 Other amnesia: Secondary | ICD-10-CM
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
## 2017-12-16 NOTE — Progress Notes (Signed)
   Neuropsychology Note  Sara Davila completed 60 minutes of neuropsychological testing with technician, Milana Kidney, BS, under the supervision of Dr. Macarthur Critchley, Licensed Psychologist. The patient did not appear overtly distressed by the testing session, per behavioral observation or via self-report to the technician. Rest breaks were offered.   Clinical Decision Making: In considering the patient's current level of functioning, level of presumed impairment, nature of symptoms, emotional and behavioral responses during the interview, level of literacy, and observed level of motivation/effort, a battery of tests was selected and communicated to the psychometrician.  Communication between the psychologist and technician was ongoing throughout the testing session and changes were made as deemed necessary based on patient performance on testing, technician observations and additional pertinent factors such as those listed above.  Sara Davila will return within approximately 2 weeks for an interactive feedback session with Dr. Si Raider at which time her test performances, clinical impressions and treatment recommendations will be reviewed in detail. The patient understands she can contact our office should she require our assistance before this time.  35 minutes spent performing neuropsychological evaluation services/clinical decision making (psychologist). [CPT 97989] 60 minutes spent face-to-face with patient administering standardized tests, 30 minutes spent scoring (technician). [CPT [id number], [id number] units]  Full report to follow.

## 2017-12-19 ENCOUNTER — Encounter: Payer: Self-pay | Admitting: Psychology

## 2017-12-20 ENCOUNTER — Telehealth: Payer: Self-pay | Admitting: *Deleted

## 2017-12-20 ENCOUNTER — Ambulatory Visit
Admission: RE | Admit: 2017-12-20 | Discharge: 2017-12-20 | Disposition: A | Discharge status: Home / Self Care | Payer: Medicare Other | Source: Ambulatory Visit | Attending: Neurology | Admitting: Neurology

## 2017-12-20 DIAGNOSIS — R413 Other amnesia: Secondary | ICD-10-CM

## 2017-12-20 DIAGNOSIS — I677 Cerebral arteritis, not elsewhere classified: Secondary | ICD-10-CM

## 2017-12-20 DIAGNOSIS — R270 Ataxia, unspecified: Secondary | ICD-10-CM

## 2017-12-20 NOTE — Telephone Encounter (Addendum)
Called patient and discussed all results of MRI brain & MRA head. She verbalized understanding and her questions were answered. She asked if she could see Dr. Jaynee Eagles sooner than July. She saw Dr. Halina Andreas on 1/17 and will see her again on 01/24/18.   ----- Message from Melvenia Beam, MD sent at 12/20/2017  2:25 PM EST ----- There are no strokes or masses or other lesions. There is atrophy of the brain which can be seen in normal aging. I recommend the formal neurocognitive testing thanks   Notes recorded by Melvenia Beam, MD on 12/20/2017 at 2:25 PM EST Blood vessels of the head normal thanks

## 2017-12-21 NOTE — Telephone Encounter (Signed)
Spoke with patient. She states that she has no family that will go to her appt  with her (sons & sister in [place]) but she is lucid and can write things down if she needs to. She is aware that she needs to see Dr. Richrd Sox first and that Dr. Jaynee Eagles will review the notes and then we will proceed with next in plan care. As of now appt will not change. She verbalized understanding and appreciation.

## 2017-12-21 NOTE — Telephone Encounter (Signed)
She has to meet with Dr. Si Raider first before coming back to see me, Dr. Si Raider will review the testing and diagnosis with patient and family, please ensure family goes to meeting.

## 2017-12-29 ENCOUNTER — Ambulatory Visit
Admission: RE | Admit: 2017-12-29 | Discharge: 2017-12-29 | Disposition: A | Discharge status: Home / Self Care | Payer: Medicare Other | Source: Ambulatory Visit | Attending: Orthopedic Surgery | Admitting: Orthopedic Surgery

## 2017-12-29 ENCOUNTER — Other Ambulatory Visit: Payer: Self-pay | Admitting: Orthopedic Surgery

## 2017-12-29 DIAGNOSIS — M25551 Pain in right hip: Secondary | ICD-10-CM
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
## 2018-01-24 ENCOUNTER — Encounter: Payer: Medicare Other | Admitting: Psychology
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
## 2018-01-31 ENCOUNTER — Ambulatory Visit (INDEPENDENT_AMBULATORY_CARE_PROVIDER_SITE_OTHER): Payer: Medicare Other | Admitting: Psychology

## 2018-01-31 ENCOUNTER — Encounter: Payer: Self-pay | Admitting: Psychology

## 2018-01-31 DIAGNOSIS — R413 Other amnesia: Secondary | ICD-10-CM

## 2018-01-31 NOTE — Patient Instructions (Signed)
Clinical Impressions: Diagnosis deferred. No indication of dementia, memory disorder or other neurocognitive disorder. The patient's performances on cognitive testing were entirely within normal limits, with all performances falling within the average to high average range. There is no evidence from this evaluation to suggest the presence of dementia, memory disorder or other neurocognitive disorder. The patient's self-reported cognitive decline is likely related to normal aging. There also was no evidence of primary psychiatric disorder.     Recommendations/Plan: Based on the findings of the present evaluation, the following recommendations are offered:  1. The patient was reassured that her testing was completely normal, with many areas of function above average for age. She was encouraged to continue participating in activities that provide mental stimulation, social interaction and physical exercise, in order to maintain brain health.   2. This evaluation will serve as a nice baseline for future comparison should it be needed at any time.

## 2018-02-28 DIAGNOSIS — M109 Gout, unspecified: Secondary | ICD-10-CM

## 2018-02-28 HISTORY — DX: Gout, unspecified: M10.9

## 2018-03-15 ENCOUNTER — Telehealth: Payer: Self-pay | Admitting: *Deleted

## 2018-03-15 NOTE — Telephone Encounter (Signed)
Called pt & offered office tomorrow as she is on wait list. Pt declined as she had another appt scheduled. Pt stated she was "all better" but still would like to be seen at her upcoming appt on 03/30/18. She also agreed to be taken off wait list since we are now 2 weeks from her appt and she has improved. RN removed pt from wait list.

## 2018-03-30 ENCOUNTER — Ambulatory Visit (INDEPENDENT_AMBULATORY_CARE_PROVIDER_SITE_OTHER): Payer: Medicare Other | Admitting: Neurology

## 2018-03-30 ENCOUNTER — Encounter: Payer: Self-pay | Admitting: Neurology

## 2018-03-30 ENCOUNTER — Encounter

## 2018-03-30 VITALS — BP 116/70 | HR 92 | Ht 61.0 in | Wt 98.0 lb

## 2018-03-30 DIAGNOSIS — G629 Polyneuropathy, unspecified: Secondary | ICD-10-CM | POA: Insufficient documentation

## 2018-03-30 MED ORDER — GABAPENTIN 300 MG PO CAPS: 300.0000 mg | ORAL_CAPSULE | Freq: Every day | ORAL | 11 refills | Status: DC

## 2018-03-30 NOTE — Patient Instructions (Signed)
Peripheral Neuropathy Peripheral neuropathy is a type of nerve damage. It affects nerves that carry signals between the spinal cord and other parts of the body. These are called peripheral nerves. With peripheral neuropathy, one nerve or a group of nerves may be damaged. What are the causes? Many things can damage peripheral nerves. For some people with peripheral neuropathy, the cause is unknown. Some causes include:  Diabetes. This is the most common cause of peripheral neuropathy.  Injury to a nerve.  Pressure or stress on a nerve that lasts a long time.  Too little vitamin B. Alcoholism can lead to this.  Infections.  Autoimmune diseases, such as multiple sclerosis and systemic lupus erythematosus.  Inherited nerve diseases.  Some medicines, such as cancer drugs.  Toxic substances, such as lead and mercury.  Too little blood flowing to the legs.  Kidney disease.  Thyroid disease.  What are the signs or symptoms? Different people have different symptoms. The symptoms you have will depend on which of your nerves is damaged. Common symptoms include:  Loss of feeling (numbness) in the feet and hands.  Tingling in the feet and hands.  Pain that burns.  Very sensitive skin.  Weakness.  Not being able to move a part of the body (paralysis).  Muscle twitching.  Clumsiness or poor coordination.  Loss of balance.  Not being able to control your bladder.  Feeling dizzy.  Sexual problems.  How is this diagnosed? Peripheral neuropathy is a symptom, not a disease. Finding the cause of peripheral neuropathy can be hard. To figure that out, your health care provider will take a medical history and do a physical exam. A neurological exam will also be done. This involves checking things affected by your brain, spinal cord, and nerves (nervous system). For example, your health care provider will check your reflexes, how you move, and what you can feel. Other types of tests  may also be ordered, such as:  Blood tests.  A test of the fluid in your spinal cord.  Imaging tests, such as CT scans or an MRI.  Electromyography (EMG). This test checks the nerves that control muscles.  Nerve conduction velocity tests. These tests check how fast messages pass through your nerves.  Nerve biopsy. A small piece of nerve is removed. It is then checked under a microscope.  How is this treated?  Medicine is often used to treat peripheral neuropathy. Medicines may include: ? Pain-relieving medicines. Prescription or over-the-counter medicine may be suggested. ? Antiseizure medicine. This may be used for pain. ? Antidepressants. These also may help ease pain from neuropathy. ? Lidocaine. This is a numbing medicine. You might wear a patch or be given a shot. ? Mexiletine. This medicine is typically used to help control irregular heart rhythms.  Surgery. Surgery may be needed to relieve pressure on a nerve or to destroy a nerve that is causing pain.  Physical therapy to help movement.  Assistive devices to help movement. Follow these instructions at home:  Only take over-the-counter or prescription medicines as directed by your health care provider. Follow the instructions carefully for any given medicines. Do not take any other medicines without first getting approval from your health care provider.  If you have diabetes, work closely with your health care provider to keep your blood sugar under control.  If you have numbness in your feet: ? Check every day for signs of injury or infection. Watch for redness, warmth, and swelling. ? Wear padded socks and comfortable   shoes. These help protect your feet.  Do not do things that put pressure on your damaged nerve.  Do not smoke. Smoking keeps blood from getting to damaged nerves.  Avoid or limit alcohol. Too much alcohol can cause a lack of B vitamins. These vitamins are needed for healthy nerves.  Develop a good  support system. Coping with peripheral neuropathy can be stressful. Talk to a mental health specialist or join a support group if you are struggling.  Follow up with your health care provider as directed. Contact a health care provider if:  You have new signs or symptoms of peripheral neuropathy.  You are struggling emotionally from dealing with peripheral neuropathy.  You have a fever. Get help right away if:  You have an injury or infection that is not healing.  You feel very dizzy or begin vomiting.  You have chest pain.  You have trouble breathing. This information is not intended to replace advice given to you by your health care provider. Make sure you discuss any questions you have with your health care provider. Document Released: 11/06/2002 Document Revised: 04/23/2016 Document Reviewed: 07/24/2013 [employer] Interactive Patient Education  2017 [employer]  

## 2018-03-30 NOTE — Progress Notes (Signed)
[facility]    Provider:  Dr Jaynee Eagles Referring Provider: Milagros Evener MD Primary Care Physician:  Milagros Evener MD  CC:  Normal neurocognitive testing  Interval history 03/30/2018: Patient completed formal memory testing and within complete normal limits. Patient doesn't seem to believe she is normal and still insists that she has memory problems. I tried to reassure her. In the future we can retest her. She still has paresthesias in her toes, in the top of her toes and the ball of her foot. Mostly at night, burning or stinging. Slowly progressive. No falls, walking is fine, no ataxia.   08/2016 for paresthesias:  Sara Davila is a 84 y.o. female here as a referral from [place], MD for paresthesias. 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 Unclear, tapered off prednisone without any recurrence, fibromyalgia, osteoarthritis, vasculitis, gluten intolerance, tinnitus, hypothyroidism, sleep disorder unspecified,  paresthesias of both feet. She is on B12 supplements 1500  daily.She has numbness in digit #2 on both feet. Started out mild and then it got worse. Wearing a bandaid helps. Burning in one toe. The rest of the foot is unaffected. It involves the whole top (not bottom) of the toe to the base of the toe doesn't extend past the base into the foot. Even touching the sheet on the toe hurts. She has to keep something in the bed to stop it from touching the toe. She wakes up and her toe hurts. Started on the top of the toe in the middle just in the top after wearing tight shoes, no other toe affected. She goes barefoot a lot because it hurts. It is affecting her sleep. Symptoms are symmetric bilaterally. No cramping in the feet. No LBP or radicular symptoms. Started 2 years ago sporadically and slowly increased and now worse at night. Better  with movement. She used to wear "wedge" shoes that pressed more on that toe. No other focal neurologic deficits or concerns. No other modifying factors or associated symptoms.  Reviewed notes, labs and imaging from outside physicians, which showed:  BUN 17, creatinine 0.660 Jayne 13th 2017, TSH 0.370 05/12/2016.  XR foot: personally reviewed imaging and agree with the following:  IMPRESSION: No acute fracture or dislocation of the second toe is observed. No significant degenerative change is observed.  HPI:  Sara Davila is a 84 y.o. female here as a referral from Dr. Radene Ou for cognitive decline. a 84 y.o. female here as a referral from [place], MD for paresthesias. 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 Unclear, tapered off prednisone without any recurrence, fibromyalgia, osteoarthritis, vasculitis, gluten intolerance, tinnitus, hypothyroidism, sleep disorder unspecified,  paresthesias of both feet. She has had multiple surgeries and feels her gait has changed, she "goes to the side" sometimes. She had physical therapy about it, may be related to her surgeries. She doesn't feels herself since the surgeries. She is in independent living. She had anesthesia twice and has felt more foggy since then. She forgets movie starts names for example, and then may remember it. It was concerning she forgot her grandson's name. She does feel the memory changes started before the surgeries. Slowly worsening, very slowly progressing. She feels she may need assisted living soon. Her father had dementia that started in his mid 91s, mother "sharp as tack". She  is having difficulty sleeping, stopped ambien about 45 days ago and is on Melatonin. She feels her mood is well controlled, she has a female friend and has companionship.   Reviewed notes, labs and imaging from  outside physicians, which showed:   Reviewed primary care notes.  Patient concerned about memory, feels like her memory is getting fuzzy, at times harder to recall words and create sentences.  She has seen me in the past in 08/2016.  She is not getting confused or forgetting dates.  She feels her mood is good.    Review of Systems: Patient complains of symptoms per HPI as well as the following symptoms: eye pain, dizziness, decreased energy, foot pain, sensory changes. Pertinent negatives and positives per HPI. All others negative.   Social History   Socioeconomic History  . Marital status: Widowed    Spouse name: Not on file  . Number of children: 2  . Years of education: Therapist, sports  . Highest education level: Not on file  Occupational History  . Occupation: Retired   Scientific laboratory technician  . Financial resource strain: Not on file  . Food insecurity:    Worry: Not on file    Inability: Not on file  . Transportation needs:    Medical: Not on file    Non-medical: Not on file  Tobacco Use  . Smoking status: Former Research scientist (life sciences)  . Smokeless tobacco: Never Used  . Tobacco comment: Smoked from age [age]  Substance and Sexual Activity  . Alcohol use: Yes    Comment: occasional glass of wine  . Drug use: No  . Sexual activity: Not Currently  Lifestyle  . Physical activity:    Days per week: Not on file    Minutes per session: Not on file  . Stress: Not on file  Relationships  . Social connections:    Talks on phone: Not on file    Gets together: Not on file    Attends religious service: Not on file    Active member of club or organization: Not on file    Attends meetings of clubs or organizations: Not on file    Relationship status: Not on file  . Intimate partner violence:    Fear of current or ex partner: Not on file    Emotionally abused: Not on file    Physically abused: Not on file    Forced sexual activity: Not on file  Other Topics Concern  . Not on file  Social History Narrative    Lives at [facility] independent living    Caffeine use: occasional cup of coffee, 2-3 times a week   Left handed    Family History  Problem Relation Age of Onset  . Heart disease Father   . Stroke Father   . Stroke Paternal Uncle   . Breast cancer Maternal Aunt   . Neuropathy Neg Hx     Past Medical History:  Diagnosis Date  . Anxiety   . Arthritis   . Cervical vertebral fusion   . Diverticulosis   . Fibromyalgia   . GERD (gastroesophageal reflux disease)   . Gout 02/2018   L hand  . Hypothyroidism   . Pneumonia   . Pneumoperitoneum 02/26/2017    Past Surgical History:  Procedure Laterality Date  . ABDOMINAL HYSTERECTOMY    . BOWEL RESECTION N/A 07/09/2017   Procedure: SMALL BOWEL RESECTION;  Surgeon: Johnathan Hausen, MD;  Location: [facility];  Service: General;  Laterality: N/A;  . CERVICAL  FUSION     x2  . LAPAROSCOPIC APPENDECTOMY N/A 03/04/2017   Procedure: APPENDECTOMY LAPAROSCOPIC;  Surgeon: Johnathan Hausen, MD;  Location: [facility];  Service: General;  Laterality: N/A;  . LAPAROSCOPY N/A 03/04/2017   Procedure: LAPAROSCOPY DIAGNOSTIC, ENTEROLYSIS;  Surgeon: Johnathan Hausen, MD;  Location: [facility];  Service: General;  Laterality: N/A;  . LAPAROTOMY N/A 07/09/2017   Procedure: EXPLORATORY LAPAROTOMY;  Surgeon: Johnathan Hausen, MD;  Location: [facility];  Service: General;  Laterality: N/A;  . SPINE SURGERY     Lumbar- rod placement  . TONSILLECTOMY     84y/o  . TOTAL VAGINAL HYSTERECTOMY      Current Outpatient Medications  Medication Sig Dispense Refill  . acetaminophen (TYLENOL) 500 MG tablet Take 1 tablet (500 mg total) by mouth every 6 (six) hours as needed for moderate pain or fever. 30 tablet 0  . Carboxymethylcellulose Sodium (THERATEARS OP) Place 1 drop into both eyes 2 (two) times daily.     . Cholecalciferol (VITAMIN D PO) Take 1,500 mg by mouth daily.    . hydrochlorothiazide (MICROZIDE) 12.5 MG capsule Take 12.5 mg by mouth 2 (two) times a week. Monday &  Friday.    . levothyroxine (SYNTHROID, LEVOTHROID) 75 MCG tablet Take 75 mcg by mouth daily before breakfast.     . Melatonin 5 MG TABS Take 5 mg by mouth at bedtime.    . Multiple Vitamins-Minerals (OCUVITE PO) Take 1 tablet by mouth daily.    Marland Kitchen omeprazole (PRILOSEC) 20 MG capsule Take 20 mg by mouth daily as needed.     . saccharomyces boulardii (FLORASTOR) 250 MG capsule Take 250 mg by mouth daily.    Marland Kitchen gabapentin (NEURONTIN) 300 MG capsule Take 1 capsule (300 mg total) by mouth at bedtime. 30 capsule 11  . LORazepam (ATIVAN) 0.5 MG tablet Take 0.25 mg by mouth at bedtime as needed for anxiety or sleep.     . traMADol (ULTRAM) 50 MG tablet Take 1 tablet (50 mg total) by mouth every 6 (six) hours as needed for moderate pain. (Patient not taking: Reported on 03/30/2018) 30 tablet 0   No current facility-administered medications for this visit.     Allergies as of 03/30/2018 - Review Complete 03/30/2018  Allergen Reaction Noted  . Codeine Rash 03/25/2012  . Penicillins Rash 07/01/2017    Vitals: BP 116/70 (BP Location: Left Arm, Patient Position: Sitting)   Pulse 92   Ht '5\' 1"'  (1.549 m)   Wt 98 lb (44.5 kg)   BMI 18.52 kg/m  Last Weight:  Wt Readings from Last 1 Encounters:  03/30/18 98 lb (44.5 kg)   Last Height:   Ht Readings from Last 1 Encounters:  03/30/18 '5\' 1"'  (1.549 m)    MMSE - Mini Mental State Exam 12/01/2017  Orientation to time 5  Orientation to Place 5  Registration 3  Attention/ Calculation 5  Recall 3  Language- name 2 objects 2  Language- repeat 1  Language- follow 3 step command 3  Language- read & follow direction 1  Write a sentence 1  Copy design 1  Total score 30      Assessment/Plan:   84 y.o. female here as a referral from Dr. Radene Ou for cognitive decline. 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 Patient doesn't seem to believe she is normal and still insists that she has memory problems. I tried to reassure her. In the future we can retest her.   - She still has paresthesias in her toes, in the top of her toes and the ball of her foot. Mostly at night, burning or stinging. Slowly progressive. Will check serum neuropathy labs that were abnormal in the past as well as genetic tests and for amyloidosis    Orders Placed This Encounter  Procedures  . ANA, IFA Comprehensive Panel  . Multiple Myeloma Panel (SPEP&IFE w/QIG)  . Hemoglobin A1c  . ANA  . ANA, IFA (with reflex)     Sarina Ill, MD  [facility] [address] [place], [place] [address]  Phone [phone] Fax [phone]  A total of 25 minutes was spent in with this patient face-to-face. Over half this time was spent on counseling patient on the neuropathy diagnosis and different therapeutic options available.

## 2018-03-31 ENCOUNTER — Telehealth: Payer: Self-pay | Admitting: *Deleted

## 2018-03-31 NOTE — Telephone Encounter (Signed)
Received PA notification for Gabapentin. Med available on [email or web address]. RN called pharmacy to confirm that PA is needed as pt has been on this medication in past. Pharmacy tech confirmed PA required however to avoid this they can offer the patient a store discount and see what she wants to do. They will call the patient.

## 2018-04-04 LAB — MULTIPLE MYELOMA PANEL, SERUM
Albumin SerPl Elph-Mcnc: 3.6 g/dL (ref 2.9–4.4)
Albumin/Glob SerPl: 1.2 (ref 0.7–1.7)
Alpha 1: 0.3 g/dL (ref 0.0–0.4)
Alpha2 Glob SerPl Elph-Mcnc: 1 g/dL (ref 0.4–1.0)
B-Globulin SerPl Elph-Mcnc: 1.1 g/dL (ref 0.7–1.3)
Gamma Glob SerPl Elph-Mcnc: 0.8 g/dL (ref 0.4–1.8)
Globulin, Total: 3.1 g/dL (ref 2.2–3.9)
IgA/Immunoglobulin A, Serum: 145 mg/dL (ref 64–422)
IgG (Immunoglobin G), Serum: 863 mg/dL (ref 700–1600)
IgM (Immunoglobulin M), Srm: 105 mg/dL (ref 26–217)
Total Protein: 6.7 g/dL (ref 6.0–8.5)

## 2018-04-04 LAB — ANA: ANA Titer 1: NEGATIVE

## 2018-04-04 LAB — HEMOGLOBIN A1C
Est. average glucose Bld gHb Est-mCnc: 123 mg/dL
Hgb A1c MFr Bld: 5.9 % — ABNORMAL HIGH (ref 4.8–5.6)

## 2018-04-06 ENCOUNTER — Telehealth: Payer: Self-pay | Admitting: *Deleted

## 2018-04-06 NOTE — Telephone Encounter (Addendum)
Spoke with patient. She is aware that she is pre-diabetic. Her Hgb A1C is 5.9. Discussed what this lab is. Also informed pt that even pre-diabetes can cause neuropathy in the feet. The patient will f/u with her PCP. Results faxed to PCP. Also discussed healthy eating such as watching carbohydrate and sugar intake. Pt verbalized understanding and appreciation.   ----- Message from Melvenia Beam, MD sent at 04/05/2018  4:59 PM EDT ----- Patient is pre-diabetic. Even pre-diabetes can cause neuropathy in the feet.

## 2018-04-15 NOTE — Telephone Encounter (Signed)
Pt called stating that her insurance will not cover Gabapentin. Pt requesting a call to discuss options Andelyn can be reached at [phone]

## 2018-04-18 NOTE — Telephone Encounter (Signed)
Pt returned Rn's call, she is requesting a call back at [phone]

## 2018-04-18 NOTE — Telephone Encounter (Addendum)
Spoke with patient and discussed that [facility] had said that they had a store discount that they would offer the patient for her Gabapentin 300 mg. Pt did not know this and she had told them to cancel the fill of her prescription since it was going to be expensive. She still has "plenty" left of her current supply. She is curious if [facility] would be better for her prescription. She never picked up the prescription from [facility]. She also asked for a quantity of 45 in case she needs to take an extra one at night. [facility] will [facility] this evening to see how much the Gabapentin will cost and then she will call us back first thing tomorrow. She will let us know if she wants the prescription sent there or to be kept at [facility] Pt was given [facility] number.

## 2018-04-18 NOTE — Telephone Encounter (Signed)
Called pt and LVM asking for call back to discuss. Left office number in message.

## 2018-04-21 NOTE — Telephone Encounter (Signed)
Received fax from [facility] stating that Gabapentin 300 mg has been approved effective 11/30/2017 through 04/22/2019.   Called pt & informed her of the approval. She is aware that [facility] approved Gabapentin from 11/30/2017 through 04/22/2019. She will call [facility] if she has not heard from them within 1-2 days to setup delivery. She will also call our office back if she cannot get her medication. Pt very appreciative.

## 2018-04-21 NOTE — Telephone Encounter (Addendum)
RN called [facility] to do an expedited telephone appeal for Gabapentin. Was transferred to correct department. Was told that patient's plan requires authorization each year. Patient is on Gabapentin for peripheral neuropathy and has been on this since October 2017. This information was given to the representative. Anticipate appeal determination within 72 hours on 04/24/18.   Case ID: [id number]

## 2018-05-31 ENCOUNTER — Ambulatory Visit: Payer: Medicare Other | Admitting: Neurology

## 2018-06-07 ENCOUNTER — Ambulatory Visit: Payer: Medicare Other | Admitting: Neurology

## 2018-06-13 ENCOUNTER — Other Ambulatory Visit: Payer: Self-pay | Admitting: Student

## 2018-06-13 DIAGNOSIS — M5412 Radiculopathy, cervical region: Secondary | ICD-10-CM

## 2018-06-23 ENCOUNTER — Ambulatory Visit
Admission: RE | Admit: 2018-06-23 | Discharge: 2018-06-23 | Disposition: A | Discharge status: Home / Self Care | Payer: Medicare Other | Source: Ambulatory Visit | Attending: Student | Admitting: Student

## 2018-06-23 DIAGNOSIS — M5412 Radiculopathy, cervical region: Secondary | ICD-10-CM

## 2018-06-23 MED ORDER — TRIAMCINOLONE ACETONIDE 40 MG/ML IJ SUSP (RADIOLOGY)
60.0000 mg | Freq: Once | INTRAMUSCULAR | Status: AC
  Administered 2018-06-23: 60 mg via EPIDURAL

## 2018-06-23 MED ORDER — IOPAMIDOL (ISOVUE-M 200) INJECTION 41%
1.0000 mL | Freq: Once | INTRAMUSCULAR | Status: AC
  Administered 2018-06-23: 1 mL via EPIDURAL

## 2018-06-23 NOTE — Discharge Instructions (Signed)

## 2018-08-16 ENCOUNTER — Other Ambulatory Visit: Payer: Self-pay | Admitting: Family Medicine

## 2018-08-16 DIAGNOSIS — R6889 Other general symptoms and signs: Secondary | ICD-10-CM

## 2018-08-23 ENCOUNTER — Ambulatory Visit
Admission: RE | Admit: 2018-08-23 | Discharge: 2018-08-23 | Disposition: A | Discharge status: Home / Self Care | Payer: Medicare Other | Source: Ambulatory Visit | Attending: Family Medicine | Admitting: Family Medicine

## 2018-08-23 DIAGNOSIS — R6889 Other general symptoms and signs: Secondary | ICD-10-CM

## 2019-04-11 ENCOUNTER — Telehealth: Payer: Self-pay | Admitting: Neurology

## 2019-04-11 ENCOUNTER — Other Ambulatory Visit: Payer: Self-pay | Admitting: Neurology

## 2019-04-11 DIAGNOSIS — G629 Polyneuropathy, unspecified: Secondary | ICD-10-CM

## 2019-04-11 NOTE — Telephone Encounter (Signed)
Pt is needing her refills on her gabapentin (NEURONTIN) 300 MG capsule sent to [facility]

## 2020-01-04 ENCOUNTER — Encounter: Payer: Self-pay | Admitting: Nurse Practitioner

## 2020-01-04 ENCOUNTER — Non-Acute Institutional Stay: Payer: Medicare Other | Admitting: Nurse Practitioner

## 2020-01-04 ENCOUNTER — Other Ambulatory Visit: Payer: Self-pay

## 2020-01-04 DIAGNOSIS — G629 Polyneuropathy, unspecified: Secondary | ICD-10-CM

## 2020-01-04 DIAGNOSIS — K219 Gastro-esophageal reflux disease without esophagitis: Secondary | ICD-10-CM

## 2020-01-04 DIAGNOSIS — M8000XS Age-related osteoporosis with current pathological fracture, unspecified site, sequela: Secondary | ICD-10-CM | POA: Diagnosis not present

## 2020-01-04 DIAGNOSIS — R413 Other amnesia: Secondary | ICD-10-CM | POA: Diagnosis not present

## 2020-01-04 DIAGNOSIS — M810 Age-related osteoporosis without current pathological fracture: Secondary | ICD-10-CM | POA: Insufficient documentation

## 2020-01-04 DIAGNOSIS — R7303 Prediabetes: Secondary | ICD-10-CM

## 2020-01-04 DIAGNOSIS — F5105 Insomnia due to other mental disorder: Secondary | ICD-10-CM

## 2020-01-04 DIAGNOSIS — F419 Anxiety disorder, unspecified: Secondary | ICD-10-CM

## 2020-01-04 DIAGNOSIS — Z8679 Personal history of other diseases of the circulatory system: Secondary | ICD-10-CM

## 2020-01-04 DIAGNOSIS — E032 Hypothyroidism due to medicaments and other exogenous substances: Secondary | ICD-10-CM

## 2020-01-04 DIAGNOSIS — I872 Venous insufficiency (chronic) (peripheral): Secondary | ICD-10-CM

## 2020-01-04 NOTE — Assessment & Plan Note (Signed)
Currently on Levothyroxine 29mcg qd, update TSH

## 2020-01-04 NOTE — Patient Instructions (Addendum)
F/u in clinic with Dr. Gupta in 2 months, obtain MMSE, update TSH, CBC/diff, CMP/eGFR, lipid panel, Hgb a1c prior to the appointment.  

## 2020-01-04 NOTE — Assessment & Plan Note (Signed)
Vit D, Ca,  started  Boniva, pathological fx of the pelvis about a year and half ago, no recent falls.

## 2020-01-04 NOTE — Assessment & Plan Note (Signed)
Prn Omeprazole.

## 2020-01-04 NOTE — Assessment & Plan Note (Signed)
Update Hgb a1c, continue diet and exercise life style modification

## 2020-01-04 NOTE — Assessment & Plan Note (Signed)
Taking HCTZ 2x/wk, higher dose caused hyponatremia in the past.

## 2020-01-04 NOTE — Assessment & Plan Note (Signed)
Prn Ambien, Lorazepam is efficient.

## 2020-01-04 NOTE — Assessment & Plan Note (Signed)
Tingling sensation in feet, L>R, mostly at night, her symptoms is better  if no socks during day, continue Tramadol.

## 2020-01-04 NOTE — Progress Notes (Signed)
Location:   clinic [facility]   Place of Service:  Clinic (12) Provider: Marlana Latus NP  Code Status: DNR Goals of Care: [place] 01/04/2020  Does Patient Have a Medical Advance Directive? Yes  Type of Advance Directive Out of facility DNR (pink MOST or yellow form);Living will  Does patient want to make changes to medical advance directive? No - Patient declined  Copy of [place] in Chart? -  Would patient like information on creating a medical advance directive? -  Pre-existing out of facility DNR order (yellow form or pink MOST form) Pink MOST form placed in chart (order not valid for inpatient use)     Chief Complaint  Patient presents with  . New Admit To SNF    Patient here today to establish care. She complains of issues with her memory, sleep and fatigue. She recieved both COVID vaccines.     HPI: Patient is a 86 y.o. female seen today for medical management of chronic diseases.    Hx of tingling in BLE, more at night, interfere night sleep, failed Gabapentin, Tramadol prn is efficient. She sleeps with aids Ambien, Lorazepa prn. Chronic ankle edema, HCTZ 2x/wk, higher dose caused hyponatremia. Hypothyroidism, on Levothyroxine 65mg qd. Osteoporosis, on Vit D, Ca,  started  Boniva, pathological fx of the pelvis about a year and half ago, no recent falls. Pre diabetic, diet controlled.    Past Medical History:  Diagnosis Date  . Anxiety   . Arthritis    Left Knee, Wrist, Back and Hips  . Cervical vertebral fusion   . Diverticulitis   . Diverticulosis   . Fibromyalgia   . GERD (gastroesophageal reflux disease)   . Gout 02/2018   L hand  . Hypothyroidism   . [place]vis fracture [place]   . Pneumonia   . Pneumoperitoneum 02/26/2017  . Thyroid disorder     Past Surgical History:  Procedure Laterality Date  . ABDOMINAL HYSTERECTOMY    . BOWEL RESECTION N/A 07/09/2017   Procedure: SMALL BOWEL RESECTION;  Surgeon: MJohnathan Hausen MD;  Location:  [facility];  Service: General;  Laterality: N/A;  . CERVICAL FUSION     x2  . LAPAROSCOPIC APPENDECTOMY N/A 03/04/2017   Procedure: APPENDECTOMY LAPAROSCOPIC;  Surgeon: MJohnathan Hausen MD;  Location: [facility];  Service: General;  Laterality: N/A;  . LAPAROSCOPY N/A 03/04/2017   Procedure: LAPAROSCOPY DIAGNOSTIC, ENTEROLYSIS;  Surgeon: MJohnathan Hausen MD;  Location: [facility];  Service: General;  Laterality: N/A;  . LAPAROTOMY N/A 07/09/2017   Procedure: EXPLORATORY LAPAROTOMY;  Surgeon: MJohnathan Hausen MD;  Location: [facility];  Service: General;  Laterality: N/A;  . SPINE SURGERY     Lumbar- rod placement  . TONSILLECTOMY     86y/o  . TOTAL VAGINAL HYSTERECTOMY      Allergies  Allergen Reactions  . Codeine Rash  . Penicillins Rash    Has patient had a PCN reaction causing immediate rash, facial/tongue/throat swelling, SOB or lightheadedness with hypotension: No Has patient had a PCN reaction causing severe rash involving mucus membranes or skin necrosis: No Has patient had a PCN reaction that required hospitalization: No Has patient had a PCN reaction occurring within the last 10 years: No If all of the above answers are "NO", then may proceed with Cephalosporin use.     Allergies as of 01/04/2020      Reactions   Codeine Rash   Penicillins Rash   Has patient had a PCN reaction causing immediate rash,  facial/tongue/throat swelling, SOB or lightheadedness with hypotension: No Has patient had a PCN reaction causing severe rash involving mucus membranes or skin necrosis: No Has patient had a PCN reaction that required hospitalization: No Has patient had a PCN reaction occurring within the last 10 years: No If all of the above answers are "NO", then may proceed with Cephalosporin use.      Medication List       Accurate as of January 04, 2020 11:59 PM. If you have any questions, ask your nurse or doctor.        CALCIUM 500/D PO Take 500 mg by mouth daily.   HAIR SKIN NAILS PO Take by  mouth daily.   hydrochlorothiazide 12.5 MG capsule Commonly known as: MICROZIDE Take 12.5 mg by mouth 2 (two) times a week. Monday & Friday.   ibandronate 150 MG tablet Commonly known as: BONIVA Take 150 mg by mouth every 30 (thirty) days. Take in the morning with a full glass of water, on an empty stomach, and do not take anything else by mouth or lie down for the next 30 min.   levothyroxine 75 MCG tablet Commonly known as: SYNTHROID Take 75 mcg by mouth daily before breakfast.   LORazepam 0.5 MG tablet Commonly known as: ATIVAN Take 0.25 mg by mouth at bedtime as needed for anxiety or sleep.   Melatonin 5 MG Tabs Take 5 mg by mouth at bedtime.   omeprazole 20 MG capsule Commonly known as: PRILOSEC Take 20 mg by mouth daily as needed.   polyethylene glycol 17 g packet Commonly known as: MIRALAX / GLYCOLAX Take 17 g by mouth daily as needed.   PREVAGEN PO Take by mouth daily.   PROBIOTIC PO Take by mouth. Dose unknown   THERATEARS OP Place 1 drop into both eyes 2 (two) times daily.   traMADol 50 MG tablet Commonly known as: ULTRAM Take 50 mg by mouth as needed.   TYLENOL EXTRA STRENGTH PO Take by mouth as needed.   Vitamin D (Cholecalciferol) 50 MCG (2000 UT) Caps Take 1 tablet by mouth daily.   zolpidem 10 MG tablet Commonly known as: AMBIEN Take 5 mg by mouth at bedtime as needed for sleep.       Review of Systems:  Review of Systems  Constitutional: Negative for activity change, appetite change, chills, diaphoresis, fatigue, fever and unexpected weight change.  HENT: Negative for congestion and voice change.   Eyes: Negative for visual disturbance.  Respiratory: Negative for cough, shortness of breath and wheezing.   Cardiovascular: Positive for leg swelling.  Gastrointestinal: Negative for abdominal distention, abdominal pain, constipation, diarrhea, nausea and vomiting.  Genitourinary: Negative for difficulty urinating, dysuria and urgency.        2-3x/night bathroom trips.   Musculoskeletal: Positive for arthralgias, back pain and gait problem.  Skin: Negative for color change and pallor.  Neurological: Negative for dizziness, speech difficulty, weakness and headaches.  Psychiatric/Behavioral: Positive for sleep disturbance. Negative for agitation, behavioral problems and hallucinations. The patient is not nervous/anxious.     Health Maintenance  Topic Date Due  . TETANUS/TDAP  10/26/1949  . DEXA SCAN  10/25/1997  . PNA vac Low Risk Adult (1 of 2 - PCV13) 10/25/1997  . INFLUENZA VACCINE  07/01/2019    Physical Exam: Vitals:   01/04/20 1318  BP: 120/76  Pulse: 87  Temp: 98 F (36.7 C)  SpO2: 97%  Weight: 99 lb 6.4 oz (45.1 kg)  Height: _0  (1.549 m)  Body mass index is 18.78 kg/m. Physical Exam Constitutional:      General: She is not in acute distress.    Appearance: Normal appearance. She is not ill-appearing, toxic-appearing or diaphoretic.  HENT:     Head: Normocephalic and atraumatic.     Nose: Nose normal.     Mouth/Throat:     Mouth: Mucous membranes are moist.  Eyes:     Extraocular Movements: Extraocular movements intact.     Conjunctiva/sclera: Conjunctivae normal.     Pupils: Pupils are equal, round, and reactive to light.  Cardiovascular:     Rate and Rhythm: Normal rate and regular rhythm.     Heart sounds: No murmur.     Comments: Weak left DP pulse, more symptomatic tingling sensation at night.  Pulmonary:     Effort: Pulmonary effort is normal.     Breath sounds: Rales present. No wheezing or rhonchi.     Comments: Left base Abdominal:     General: Bowel sounds are normal. There is no distension.     Palpations: Abdomen is soft.     Tenderness: There is no abdominal tenderness. There is no right CVA tenderness, left CVA tenderness, guarding or rebound.  Musculoskeletal:     Cervical back: Normal range of motion and neck supple.     Right lower leg: Edema present.     Left lower leg:  Edema present.     Comments: Trace edema BLE, mild scoliosis.   Skin:    General: Skin is warm and dry.     Comments: Loosing hair gradually in the past 10 years.   Neurological:     General: No focal deficit present.     Mental Status: She is alert and oriented to person, place, and time. Mental status is at baseline.     Motor: No weakness.     Coordination: Coordination normal.     Gait: Gait abnormal.     Comments: Using walker sometimes.   Psychiatric:        Mood and Affect: Mood normal.        Behavior: Behavior normal.        Thought Content: Thought content normal.        Judgment: Judgment normal.     Labs reviewed: Basic Metabolic Panel: No results for input(s): NA, K, CL, CO2, GLUCOSE, BUN, CREATININE, CALCIUM, MG, PHOS, TSH in the last 8760 hours. Liver Function Tests: No results for input(s): AST, ALT, ALKPHOS, BILITOT, PROT, ALBUMIN in the last 8760 hours. No results for input(s): LIPASE, AMYLASE in the last 8760 hours. No results for input(s): AMMONIA in the last 8760 hours. CBC: No results for input(s): WBC, NEUTROABS, HGB, HCT, MCV, PLT in the last 8760 hours. Lipid Panel: No results for input(s): CHOL, HDL, LDLCALC, TRIG, CHOLHDL, LDLDIRECT in the last 8760 hours. Lab Results  Component Value Date   HGBA1C 5.9 (H) 03/30/2018    Procedures since last visit: No results found.  Assessment/Plan  Prediabetes Update Hgb a1c, continue diet and exercise life style modification  Memory deficit [facility] evaluation, she was told she is okay, on Prevagen. Did not tolerate Donepezil-nausea/vomiting. MMSE next visit, may consider Mementine or Exelon patch.   Osteoporosis  Vit D, Ca,  started  Boniva, pathological fx of the pelvis about a year and half ago, no recent falls.  Neuropathy Tingling sensation in feet, L>R, mostly at night, her symptoms is better  if no socks during day, continue Tramadol.   Insomnia  secondary to anxiety Prn  Ambien, Lorazepam is efficient.   Hypothyroidism Currently on Levothyroxine 48mg qd, update TSH  [facility] of vasculitis [facility] [place] [facility] F/u neurology. No further recurrence.   GERD (gastroesophageal reflux disease) Prn Omeprazole.   Venous insufficiency Taking HCTZ 2x/wk, higher dose caused hyponatremia in the past.    Labs/tests ordered:  CBC/dff, CMP/eGFR, TSH, Hgb a1c, lipid panel,   Next appt:  2 months

## 2020-01-04 NOTE — Assessment & Plan Note (Signed)
[facility] evaluation, she was told she is okay, on Prevagen. Did not tolerate Donepezil-nausea/vomiting. MMSE next visit, may consider Mementine or Exelon patch.

## 2020-01-04 NOTE — Assessment & Plan Note (Signed)
About 9 years ago while residing in [facility], treated with steroid. F/u neurology. No further recurrence.

## 2020-01-05 ENCOUNTER — Encounter: Payer: Self-pay | Admitting: Nurse Practitioner

## 2020-01-16 ENCOUNTER — Other Ambulatory Visit: Payer: Self-pay | Admitting: *Deleted

## 2020-01-16 MED ORDER — TRAMADOL HCL 50 MG PO TABS: 50.0000 mg | ORAL_TABLET | ORAL | 0 refills | Status: DC | PRN

## 2020-01-16 NOTE — Telephone Encounter (Signed)
Patient requested refill.  Pended Rx and sent to [facility] for approval.  

## 2020-03-02 ENCOUNTER — Emergency Department (HOSPITAL_COMMUNITY): Payer: Medicare Other

## 2020-03-02 ENCOUNTER — Other Ambulatory Visit: Payer: Self-pay

## 2020-03-02 ENCOUNTER — Inpatient Hospital Stay (HOSPITAL_COMMUNITY)
Admission: EM | Admit: 2020-03-02 | Discharge: 2020-03-05 | DRG: 481 | Disposition: A | Discharge status: Skilled Nursing Facility | Payer: Medicare Other | Attending: Internal Medicine | Admitting: Internal Medicine

## 2020-03-02 DIAGNOSIS — S72142A Displaced intertrochanteric fracture of left femur, initial encounter for closed fracture: Principal | ICD-10-CM | POA: Diagnosis present

## 2020-03-02 DIAGNOSIS — Z79890 Hormone replacement therapy (postmenopausal): Secondary | ICD-10-CM

## 2020-03-02 DIAGNOSIS — M810 Age-related osteoporosis without current pathological fracture: Secondary | ICD-10-CM | POA: Diagnosis present

## 2020-03-02 DIAGNOSIS — W010XXA Fall on same level from slipping, tripping and stumbling without subsequent striking against object, initial encounter: Secondary | ICD-10-CM | POA: Diagnosis present

## 2020-03-02 DIAGNOSIS — G4700 Insomnia, unspecified: Secondary | ICD-10-CM | POA: Diagnosis present

## 2020-03-02 DIAGNOSIS — Z90710 Acquired absence of both cervix and uterus: Secondary | ICD-10-CM

## 2020-03-02 DIAGNOSIS — W19XXXA Unspecified fall, initial encounter: Secondary | ICD-10-CM | POA: Diagnosis present

## 2020-03-02 DIAGNOSIS — E039 Hypothyroidism, unspecified: Secondary | ICD-10-CM | POA: Diagnosis present

## 2020-03-02 DIAGNOSIS — S72002A Fracture of unspecified part of neck of left femur, initial encounter for closed fracture: Secondary | ICD-10-CM

## 2020-03-02 DIAGNOSIS — K219 Gastro-esophageal reflux disease without esophagitis: Secondary | ICD-10-CM | POA: Diagnosis present

## 2020-03-02 DIAGNOSIS — Z981 Arthrodesis status: Secondary | ICD-10-CM

## 2020-03-02 DIAGNOSIS — Z79899 Other long term (current) drug therapy: Secondary | ICD-10-CM

## 2020-03-02 DIAGNOSIS — Y92008 Other place in unspecified non-institutional (private) residence as the place of occurrence of the external cause: Secondary | ICD-10-CM

## 2020-03-02 DIAGNOSIS — Z87891 Personal history of nicotine dependence: Secondary | ICD-10-CM

## 2020-03-02 DIAGNOSIS — F419 Anxiety disorder, unspecified: Secondary | ICD-10-CM | POA: Diagnosis present

## 2020-03-02 DIAGNOSIS — D62 Acute posthemorrhagic anemia: Secondary | ICD-10-CM | POA: Diagnosis not present

## 2020-03-02 DIAGNOSIS — S72009A Fracture of unspecified part of neck of unspecified femur, initial encounter for closed fracture: Secondary | ICD-10-CM

## 2020-03-02 DIAGNOSIS — F5105 Insomnia due to other mental disorder: Secondary | ICD-10-CM | POA: Diagnosis present

## 2020-03-02 DIAGNOSIS — Z20822 Contact with and (suspected) exposure to covid-19: Secondary | ICD-10-CM | POA: Diagnosis present

## 2020-03-02 DIAGNOSIS — K5900 Constipation, unspecified: Secondary | ICD-10-CM | POA: Diagnosis present

## 2020-03-02 DIAGNOSIS — M25552 Pain in left hip: Secondary | ICD-10-CM | POA: Diagnosis not present

## 2020-03-02 DIAGNOSIS — M797 Fibromyalgia: Secondary | ICD-10-CM | POA: Diagnosis present

## 2020-03-02 DIAGNOSIS — Z66 Do not resuscitate: Secondary | ICD-10-CM | POA: Diagnosis present

## 2020-03-02 DIAGNOSIS — Y9389 Activity, other specified: Secondary | ICD-10-CM

## 2020-03-02 DIAGNOSIS — I10 Essential (primary) hypertension: Secondary | ICD-10-CM | POA: Diagnosis present

## 2020-03-02 DIAGNOSIS — G629 Polyneuropathy, unspecified: Secondary | ICD-10-CM | POA: Diagnosis present

## 2020-03-02 DIAGNOSIS — Z9049 Acquired absence of other specified parts of digestive tract: Secondary | ICD-10-CM

## 2020-03-02 DIAGNOSIS — Z8781 Personal history of (healed) traumatic fracture: Secondary | ICD-10-CM | POA: Diagnosis present

## 2020-03-02 LAB — CBC WITH DIFFERENTIAL/PLATELET
Abs Immature Granulocytes: 0.08 10*3/uL — ABNORMAL HIGH (ref 0.00–0.07)
Basophils Absolute: 0.1 10*3/uL (ref 0.0–0.1)
Basophils Relative: 1 %
Eosinophils Absolute: 0.2 10*3/uL (ref 0.0–0.5)
Eosinophils Relative: 1 %
HCT: 37.7 % (ref 36.0–46.0)
Hemoglobin: 12.6 g/dL (ref 12.0–15.0)
Immature Granulocytes: 1 %
Lymphocytes Relative: 6 %
Lymphs Abs: 0.9 10*3/uL (ref 0.7–4.0)
MCH: 32.8 pg (ref 26.0–34.0)
MCHC: 33.4 g/dL (ref 30.0–36.0)
MCV: 98.2 fL (ref 80.0–100.0)
Monocytes Absolute: 1 10*3/uL (ref 0.1–1.0)
Monocytes Relative: 6 %
Neutro Abs: 13.4 10*3/uL — ABNORMAL HIGH (ref 1.7–7.7)
Neutrophils Relative %: 85 %
Platelets: 324 10*3/uL (ref 150–400)
RBC: 3.84 MIL/uL — ABNORMAL LOW (ref 3.87–5.11)
RDW: 12.7 % (ref 11.5–15.5)
WBC: 15.6 10*3/uL — ABNORMAL HIGH (ref 4.0–10.5)
nRBC: 0 % (ref 0.0–0.2)

## 2020-03-02 MED ORDER — FENTANYL CITRATE (PF) 100 MCG/2ML IJ SOLN
50.0000 ug | INTRAMUSCULAR | Status: AC | PRN
  Administered 2020-03-02 – 2020-03-03 (×2): 50 ug via INTRAVENOUS
  Filled 2020-03-02 (×3): qty 2

## 2020-03-02 NOTE — ED Provider Notes (Signed)
[facility] DEPT Provider Note   CSN: [id number] Arrival date & time: 03/02/20  2144     History Chief Complaint  Patient presents with  . Fall  . Leg Pain  . Head Injury    Sara Davila is a 86 y.o. female presenting for evaluation of left leg pain.  Patient states she was on her patio when her legs got tangled up, and she fell onto her left side.  A potted plant fell off the table, and hit her on the left lower occiput.  She denies loss of consciousness.  Patient states she had acute onset severe left-sided hip pain, which has been constant since.  Pain is worse with any movement.  She denies numbness or tingling.  She denies pain in her knee or foot.  She denies current headache, neck pain, back pain, chest pain, shortness of breath, nausea, vomiting, loss of bowel bladder control.  She is not on blood thinners.  Additional history obtained from chart review.  Patient with a history of anxiety, arthritis, previous cervical fusion, fibromyalgia, GERD, hypothyroidism, previous pelvic fracture.   HPI     Past Medical History:  Diagnosis Date  . Anxiety   . Arthritis    Left Knee, Wrist, Back and Hips  . Cervical vertebral fusion   . Diverticulitis   . Diverticulosis   . Fibromyalgia   . GERD (gastroesophageal reflux disease)   . Gout 02/2018   L hand  . Hypothyroidism   . Pelvis fracture ([place])   . Pneumonia   . Pneumoperitoneum 02/26/2017  . Thyroid disorder     Patient Active Problem List   Diagnosis Date Noted  . Displaced intertrochanteric fracture of left femur, initial encounter for closed fracture ([place]) 03/03/2020  . Prediabetes 01/04/2020  . Memory deficit 01/04/2020  . Osteoporosis 01/04/2020  . History of vasculitis 01/04/2020  . Venous insufficiency 01/04/2020  . Neuropathy 03/30/2018  . Hyponatremia 07/21/2017  . Insomnia secondary to anxiety 07/21/2017  . GERD (gastroesophageal reflux disease) 07/21/2017  . S/P small  bowel resection 07/09/2017  . Abdominal pain 02/26/2017  . Hypothyroidism 02/26/2017  . Toe pain 09/06/2016  . Paresthesias 09/06/2016  . Fall 03/29/2012  . Right rib fracture 03/29/2012  . Peritoneal free air 03/29/2012    Past Surgical History:  Procedure Laterality Date  . ABDOMINAL HYSTERECTOMY    . BOWEL RESECTION N/A 07/09/2017   Procedure: SMALL BOWEL RESECTION;  Surgeon: Johnathan Hausen, MD;  Location: [facility];  Service: General;  Laterality: N/A;  . CERVICAL FUSION     x2  . LAPAROSCOPIC APPENDECTOMY N/A 03/04/2017   Procedure: APPENDECTOMY LAPAROSCOPIC;  Surgeon: Johnathan Hausen, MD;  Location: [facility];  Service: General;  Laterality: N/A;  . LAPAROSCOPY N/A 03/04/2017   Procedure: LAPAROSCOPY DIAGNOSTIC, ENTEROLYSIS;  Surgeon: Johnathan Hausen, MD;  Location: [facility];  Service: General;  Laterality: N/A;  . LAPAROTOMY N/A 07/09/2017   Procedure: EXPLORATORY LAPAROTOMY;  Surgeon: Johnathan Hausen, MD;  Location: [facility];  Service: General;  Laterality: N/A;  . SPINE SURGERY     Lumbar- rod placement  . TONSILLECTOMY     86y/o  . TOTAL VAGINAL HYSTERECTOMY       OB History   No obstetric history on file.     Family History  Problem Relation Age of Onset  . Asthma Mother   . Pulmonary embolism Mother   . Macular degeneration Mother   . Heart disease Father   . Stroke Father   .  Stroke Paternal Uncle   . Breast cancer Maternal Aunt   . Macular degeneration Sister   . Stroke Sister   . Neuropathy Neg Hx     Social History   Tobacco Use  . Smoking status: Former Research scientist (life sciences)  . Smokeless tobacco: Never Used  . Tobacco comment: Smoked from age [age]  Substance Use Topics  . Alcohol use: Yes    Comment: occasional glass of wine  . Drug use: No    Home Medications Prior to Admission medications   Medication Sig Start Date End Date Taking? Authorizing Provider  Apoaequorin (PREVAGEN PO) Take by mouth daily.   Yes [provider]  Calcium Carbonate-Vitamin D  (CALCIUM 500/D PO) Take 500 mg by mouth daily.   Yes [provider]  Carboxymethylcellulose Sodium (THERATEARS OP) Place 1 drop into both eyes 2 (two) times daily.    Yes [provider]  hydrochlorothiazide (MICROZIDE) 12.5 MG capsule Take 12.5 mg by mouth 2 (two) times a week. Monday & Friday. 02/18/18  Yes [provider]  ibandronate (BONIVA) 150 MG tablet Take 150 mg by mouth every 30 (thirty) days. Take in the morning with a full glass of water, on an empty stomach, and do not take anything else by mouth or lie down for the next 30 min.   Yes [provider]  levothyroxine (SYNTHROID, LEVOTHROID) 75 MCG tablet Take 75 mcg by mouth daily before breakfast.    Yes [provider]  LORazepam (ATIVAN) 0.5 MG tablet Take 0.25 mg by mouth at bedtime as needed for anxiety or sleep.    Yes [provider]  Melatonin 5 MG TABS Take 5 mg by mouth at bedtime.   Yes [provider]  Multiple Vitamins-Minerals (HAIR SKIN NAILS PO) Take by mouth daily.   Yes [provider]  omeprazole (PRILOSEC) 20 MG capsule Take 20 mg by mouth daily as needed.    Yes [provider]  polyethylene glycol (MIRALAX / GLYCOLAX) 17 g packet Take 17 g by mouth daily as needed.   Yes [provider]  Vitamin D, Cholecalciferol, 50 MCG (2000 UT) CAPS Take 1 tablet by mouth daily.   Yes [provider]  zolpidem (AMBIEN) 10 MG tablet Take 5 mg by mouth at bedtime as needed for sleep.   Yes [provider]  Acetaminophen (TYLENOL EXTRA STRENGTH PO) Take by mouth as needed.    [provider]  Probiotic Product (PROBIOTIC PO) Take by mouth. Dose unknown    [provider]  traMADol (ULTRAM) 50 MG tablet Take 1 tablet (50 mg total) by mouth as needed. 01/16/20   Mast, Man X, NP    Allergies    Codeine and Penicillins  Review of Systems   Review of Systems  Musculoskeletal: Positive for arthralgias (L hip).   All other systems reviewed and are negative.   Physical Exam Updated Vital Signs BP 124/68   Pulse 93   Temp 98 F (36.7 C) (Oral)   Resp 19   Ht 5' (1.524 m)   Wt 41.7 kg   SpO2 93%   BMI 17.97 kg/m   Physical Exam Vitals and nursing note reviewed.  Constitutional:      General: She is not in acute distress.    Appearance: She is well-developed.     Comments: Appears uncomfortable due to pain, otherwise nontoxic  HENT:     Head: Normocephalic and atraumatic.     Comments: No obvious head trauma.  No tenderness palpation of the scalp. Eyes:     Extraocular Movements: Extraocular movements intact.     Conjunctiva/sclera: Conjunctivae normal.     Pupils: Pupils are equal, round, and reactive to light.  Neck:     Comments: No chest palpation midline C-spine.  No step-offs or deformities. Cardiovascular:     Rate and Rhythm: Normal rate and regular rhythm.     Pulses: Normal pulses.  Pulmonary:     Effort: Pulmonary effort is normal. No respiratory distress.     Breath sounds: Normal breath sounds. No wheezing.  Abdominal:     General: There is no distension.     Palpations: Abdomen is soft. There is no mass.     Tenderness: There is no abdominal tenderness. There is no guarding or rebound.  Musculoskeletal:        General: Tenderness present. Normal range of motion.     Cervical back: Normal range of motion and neck supple.       Legs:     Comments: Tenderness palpation of left anterior hip.  Left leg is shortened and minimally externally rotated.  Fetal pulse 2+ bilaterally.  Good distal sensation and cap refill.  No tenderness palpation of the lower leg or knee.  Skin:    General: Skin is warm and dry.     Capillary Refill: Capillary refill takes less than 2 seconds.  Neurological:     Mental Status: She is alert and oriented to person, place, and time.     ED Results / Procedures / Treatments   Labs (all labs ordered are listed, but only abnormal results are  displayed) Labs Reviewed  BASIC METABOLIC PANEL - Abnormal; Notable for the following components:      Result Value   Chloride 97 (*)    Glucose, Bld 162 (*)    All other components within normal limits  CBC WITH DIFFERENTIAL/PLATELET - Abnormal; Notable for the following components:   WBC 15.6 (*)    RBC 3.84 (*)    Neutro Abs 13.4 (*)    Abs Immature Granulocytes 0.08 (*)    All other components within normal limits  RESPIRATORY PANEL BY RT PCR (FLU A&B, COVID)  PROTIME-INR  TYPE AND SCREEN    EKG None  Radiology CT HEAD WO CONTRAST  Result Date: 03/02/2020 CLINICAL DATA:  Head trauma, struck in head by a potted plant causing patient to fall to ground EXAM: CT HEAD WITHOUT CONTRAST TECHNIQUE: Contiguous axial images were obtained from the base of the skull through the vertex without intravenous contrast. Sagittal and coronal MPR images reconstructed from axial data set. COMPARISON:  03/25/2012 FINDINGS: Brain: Generalized atrophy. Normal ventricular morphology. No midline shift or mass effect. Small vessel chronic ischemic changes of deep cerebral white matter. No intracranial hemorrhage, mass lesion, evidence of acute infarction, or extra-axial fluid collection. Vascular: No hyperdense vessels Skull: Intact Sinuses/Orbits: Clear Other: N/A IMPRESSION: Atrophy with minimal small vessel chronic ischemic changes of deep cerebral white matter. No acute intracranial abnormalities. Electronically Signed   By: Lavonia Dana M.D.   On: 03/02/2020 23:22   DG Chest Port 1 View  Result Date: 03/02/2020 CLINICAL DATA:  LEFT hip pain post fall EXAM: PORTABLE CHEST 1 VIEW COMPARISON:  Portable exam 2317 hours compared to 12/01/2016 FINDINGS: Normal heart size, mediastinal contours, and pulmonary vascularity. Mild scarring at RIGHT base. Lungs otherwise clear. Calcified granuloma RIGHT mid lung. No infiltrate, pleural effusion, or pneumothorax. Bones demineralized. IMPRESSION: No acute abnormalities.  Mild  scarring RIGHT base. Electronically Signed   By: Lavonia Dana M.D.   On: 03/02/2020 23:24   DG Hip Unilat W or Wo Pelvis 2-3 Views Left  Result Date: 03/02/2020 CLINICAL DATA:  Struck in the head by a potted plant, fell to ground, LEFT hip pain EXAM: DG HIP (WITH OR WITHOUT PELVIS) 2-3V LEFT COMPARISON:  Pelvic radiograph 03/25/2012 FINDINGS: Osseous demineralization. Prior lumbosacral fusion. Narrowing of hip joints bilaterally. SI joints preserved. Comminuted mildly displaced intertrochanteric fracture LEFT femur. No dislocation. Pelvis intact. IMPRESSION: Comminuted displaced intertrochanteric fracture LEFT femur. Electronically Signed   By: Lavonia Dana M.D.   On: 03/02/2020 23:23    Procedures Procedures (including critical care time)  Medications Ordered in ED Medications  fentaNYL (SUBLIMAZE) injection 100 mcg (has no administration in time range)  fentaNYL (SUBLIMAZE) injection 50 mcg (50 mcg Intravenous Given 03/03/20 0035)  fentaNYL (SUBLIMAZE) injection 50 mcg (50 mcg Intravenous Given 03/03/20 0132)    ED Course  I have reviewed the triage vital signs and the nursing notes.  Pertinent labs & imaging results that were available during my care of the patient were reviewed by me and considered in my medical decision making (see chart for details).    MDM Rules/Calculators/A&P                      Patient presented for evaluation of left hip pain after fall.  On exam, patient appears uncomfortable due to pain.  Otherwise nontoxic.  She has tenderness of the left hip, with shortening and rotation of the left leg.  Concern for fracture.  Also consider dislocation.  Patient was hit in the head with a potted plant.  She reports no pain at this time, there is no discernible injury.  However considering age and distracting injury, will obtain CT head.  Will obtain labs and chest x-ray, as I have a high suspicion for fracture and that patient may need surgery.  Case discussed with attending,  Dr. Kathrynn Humble evaluated the patient.  Labs interpreted by me, mild leukocytosis, likely due to trauma/pain.  Otherwise reassuring.  CT head negative for acute findings.  Chest x-ray viewed interpreted by me, no fracture, dislocation, or signs of injury.  Hip x-ray viewed interpreted by me, shows left hip fracture.  Will consult with orthopedics.  Discussed with Dr. Onnie Graham from [facility] who recommends admission to hospitalist service.  Recommends patient be n.p.o. after midnight, his service will consult in the morning.  Discussed with Dr. Marice Potter from [facility] hospitalist service, patient to be admitted.  Final Clinical Impression(s) / ED Diagnoses Final diagnoses:  Closed fracture of left hip, initial encounter [facility])  Fall, initial encounter    Rx / DC Orders ED Discharge Orders    None       Franchot Heidelberg, PA-C 03/03/20 0245    Varney Biles, MD 03/03/20 [id number]

## 2020-03-02 NOTE — ED Triage Notes (Signed)
While on her patio at [facility] [place], the patient was hit in the head by a potted plant. The blow to the head caused her to fall to the ground injuring her left leg. EMS reports shortening of the left leg and 10/10 pain. 100 mcg of Fentanyl was administered.    EMS vitals: 146/78 BP 110 HR 98% O2 sat on room air

## 2020-03-03 ENCOUNTER — Inpatient Hospital Stay (HOSPITAL_COMMUNITY): Payer: Medicare Other | Admitting: Anesthesiology

## 2020-03-03 ENCOUNTER — Encounter (HOSPITAL_COMMUNITY): Payer: Self-pay | Admitting: Family Medicine

## 2020-03-03 ENCOUNTER — Encounter (HOSPITAL_COMMUNITY)
Admission: EM | Disposition: A | Discharge status: Skilled Nursing Facility | Payer: Self-pay | Source: Home / Self Care | Attending: Internal Medicine

## 2020-03-03 ENCOUNTER — Inpatient Hospital Stay (HOSPITAL_COMMUNITY): Payer: Medicare Other

## 2020-03-03 DIAGNOSIS — W19XXXA Unspecified fall, initial encounter: Secondary | ICD-10-CM

## 2020-03-03 DIAGNOSIS — Z79899 Other long term (current) drug therapy: Secondary | ICD-10-CM | POA: Diagnosis not present

## 2020-03-03 DIAGNOSIS — E032 Hypothyroidism due to medicaments and other exogenous substances: Secondary | ICD-10-CM

## 2020-03-03 DIAGNOSIS — I10 Essential (primary) hypertension: Secondary | ICD-10-CM | POA: Diagnosis present

## 2020-03-03 DIAGNOSIS — Y92008 Other place in unspecified non-institutional (private) residence as the place of occurrence of the external cause: Secondary | ICD-10-CM | POA: Diagnosis not present

## 2020-03-03 DIAGNOSIS — K5900 Constipation, unspecified: Secondary | ICD-10-CM | POA: Diagnosis present

## 2020-03-03 DIAGNOSIS — Y9389 Activity, other specified: Secondary | ICD-10-CM | POA: Diagnosis not present

## 2020-03-03 DIAGNOSIS — G4700 Insomnia, unspecified: Secondary | ICD-10-CM | POA: Diagnosis present

## 2020-03-03 DIAGNOSIS — Z981 Arthrodesis status: Secondary | ICD-10-CM | POA: Diagnosis not present

## 2020-03-03 DIAGNOSIS — M8000XS Age-related osteoporosis with current pathological fracture, unspecified site, sequela: Secondary | ICD-10-CM

## 2020-03-03 DIAGNOSIS — S72142A Displaced intertrochanteric fracture of left femur, initial encounter for closed fracture: Secondary | ICD-10-CM | POA: Diagnosis not present

## 2020-03-03 DIAGNOSIS — M25552 Pain in left hip: Secondary | ICD-10-CM | POA: Diagnosis present

## 2020-03-03 DIAGNOSIS — Z87891 Personal history of nicotine dependence: Secondary | ICD-10-CM | POA: Diagnosis not present

## 2020-03-03 DIAGNOSIS — E039 Hypothyroidism, unspecified: Secondary | ICD-10-CM | POA: Diagnosis not present

## 2020-03-03 DIAGNOSIS — G629 Polyneuropathy, unspecified: Secondary | ICD-10-CM

## 2020-03-03 DIAGNOSIS — M810 Age-related osteoporosis without current pathological fracture: Secondary | ICD-10-CM | POA: Diagnosis present

## 2020-03-03 DIAGNOSIS — M797 Fibromyalgia: Secondary | ICD-10-CM | POA: Diagnosis not present

## 2020-03-03 DIAGNOSIS — D62 Acute posthemorrhagic anemia: Secondary | ICD-10-CM | POA: Diagnosis not present

## 2020-03-03 DIAGNOSIS — Z66 Do not resuscitate: Secondary | ICD-10-CM | POA: Diagnosis not present

## 2020-03-03 DIAGNOSIS — Z79890 Hormone replacement therapy (postmenopausal): Secondary | ICD-10-CM | POA: Diagnosis not present

## 2020-03-03 DIAGNOSIS — K219 Gastro-esophageal reflux disease without esophagitis: Secondary | ICD-10-CM | POA: Diagnosis not present

## 2020-03-03 DIAGNOSIS — W010XXA Fall on same level from slipping, tripping and stumbling without subsequent striking against object, initial encounter: Secondary | ICD-10-CM | POA: Diagnosis present

## 2020-03-03 DIAGNOSIS — Z20822 Contact with and (suspected) exposure to covid-19: Secondary | ICD-10-CM | POA: Diagnosis not present

## 2020-03-03 DIAGNOSIS — Z8781 Personal history of (healed) traumatic fracture: Secondary | ICD-10-CM | POA: Diagnosis present

## 2020-03-03 DIAGNOSIS — Z90710 Acquired absence of both cervix and uterus: Secondary | ICD-10-CM | POA: Diagnosis not present

## 2020-03-03 DIAGNOSIS — F419 Anxiety disorder, unspecified: Secondary | ICD-10-CM | POA: Diagnosis not present

## 2020-03-03 HISTORY — PX: FEMUR IM NAIL: SHX1597

## 2020-03-03 LAB — BASIC METABOLIC PANEL
Anion gap: 12 (ref 5–15)
BUN: 20 mg/dL (ref 8–23)
CO2: 26 mmol/L (ref 22–32)
Calcium: 9.9 mg/dL (ref 8.9–10.3)
Chloride: 97 mmol/L — ABNORMAL LOW (ref 98–111)
Creatinine, Ser: 0.8 mg/dL (ref 0.44–1.00)
GFR calc Af Amer: >60 mL/min (ref >60)
GFR calc non Af Amer: >60 mL/min (ref >60)
Glucose, Bld: 162 mg/dL — ABNORMAL HIGH (ref 70–99)
Potassium: 4.4 mmol/L (ref 3.5–5.1)
Sodium: 135 mmol/L (ref 135–145)

## 2020-03-03 LAB — MRSA PCR SCREENING: MRSA by PCR: NEGATIVE

## 2020-03-03 LAB — RESPIRATORY PANEL BY RT PCR (FLU A&B, COVID)
Influenza A by PCR: NEGATIVE
Influenza B by PCR: NEGATIVE
SARS Coronavirus 2 by RT PCR: NEGATIVE

## 2020-03-03 LAB — PROTIME-INR
INR: 1 (ref 0.8–1.2)
Prothrombin Time: 12.9 seconds (ref 11.4–15.2)

## 2020-03-03 SURGERY — INSERTION, INTRAMEDULLARY ROD, FEMUR
Anesthesia: General | Site: Hip | Laterality: Left

## 2020-03-03 MED ORDER — ROCURONIUM BROMIDE 10 MG/ML (PF) SYRINGE
PREFILLED_SYRINGE | INTRAVENOUS | Status: DC | PRN
  Administered 2020-03-03: 50 mg via INTRAVENOUS

## 2020-03-03 MED ORDER — CEFAZOLIN SODIUM-DEXTROSE 2-4 GM/100ML-% IV SOLN
INTRAVENOUS | Status: AC
  Administered 2020-03-03: 2000 mg
  Filled 2020-03-03: qty 100

## 2020-03-03 MED ORDER — METHOCARBAMOL 500 MG IVPB - SIMPLE MED
500.0000 mg | Freq: Four times a day (QID) | INTRAVENOUS | Status: DC | PRN
  Administered 2020-03-03: 15:00:00 500 mg via INTRAVENOUS
  Filled 2020-03-03: qty 50
  Filled 2020-03-03: qty 500

## 2020-03-03 MED ORDER — CEFAZOLIN SODIUM-DEXTROSE 2-4 GM/100ML-% IV SOLN
2.0000 g | Freq: Four times a day (QID) | INTRAVENOUS | Status: AC
  Administered 2020-03-03 – 2020-03-04 (×2): 2 g via INTRAVENOUS
  Filled 2020-03-03 (×2): qty 100

## 2020-03-03 MED ORDER — 0.9 % SODIUM CHLORIDE (POUR BTL) OPTIME
TOPICAL | Status: DC | PRN
  Administered 2020-03-03: 1000 mL

## 2020-03-03 MED ORDER — METHOCARBAMOL 500 MG PO TABS
500.0000 mg | ORAL_TABLET | Freq: Four times a day (QID) | ORAL | Status: DC | PRN
  Administered 2020-03-03 – 2020-03-04 (×3): 500 mg via ORAL
  Filled 2020-03-03 (×3): qty 1

## 2020-03-03 MED ORDER — PHENYLEPHRINE 40 MCG/ML (10ML) SYRINGE FOR IV PUSH (FOR BLOOD PRESSURE SUPPORT)
PREFILLED_SYRINGE | INTRAVENOUS | Status: AC
  Filled 2020-03-03: qty 10

## 2020-03-03 MED ORDER — FENTANYL CITRATE (PF) 100 MCG/2ML IJ SOLN
100.0000 ug | INTRAMUSCULAR | Status: AC | PRN
  Administered 2020-03-03: 100 ug via INTRAVENOUS
  Filled 2020-03-03: qty 2

## 2020-03-03 MED ORDER — FENTANYL CITRATE (PF) 100 MCG/2ML IJ SOLN
25.0000 ug | INTRAMUSCULAR | Status: DC | PRN
  Administered 2020-03-03: 25 ug via INTRAVENOUS
  Filled 2020-03-03: qty 2

## 2020-03-03 MED ORDER — ALBUMIN HUMAN 5 % IV SOLN: INTRAVENOUS | Status: DC | PRN

## 2020-03-03 MED ORDER — HYDROMORPHONE HCL 1 MG/ML IJ SOLN
0.5000 mg | INTRAMUSCULAR | Status: DC | PRN
  Administered 2020-03-03: 1 mg via INTRAVENOUS
  Filled 2020-03-03: qty 1

## 2020-03-03 MED ORDER — FENTANYL CITRATE (PF) 100 MCG/2ML IJ SOLN
INTRAMUSCULAR | Status: AC
  Filled 2020-03-03: qty 2

## 2020-03-03 MED ORDER — PROPOFOL 10 MG/ML IV BOLUS
INTRAVENOUS | Status: DC | PRN
  Administered 2020-03-03: 80 mg via INTRAVENOUS

## 2020-03-03 MED ORDER — ROCURONIUM BROMIDE 10 MG/ML (PF) SYRINGE
PREFILLED_SYRINGE | INTRAVENOUS | Status: AC
  Filled 2020-03-03: qty 10

## 2020-03-03 MED ORDER — ACETAMINOPHEN 500 MG PO TABS
500.0000 mg | ORAL_TABLET | Freq: Four times a day (QID) | ORAL | Status: AC
  Administered 2020-03-03 – 2020-03-04 (×3): 500 mg via ORAL
  Filled 2020-03-03 (×3): qty 1

## 2020-03-03 MED ORDER — MORPHINE SULFATE (PF) 2 MG/ML IV SOLN
1.0000 mg | INTRAVENOUS | Status: DC | PRN
  Administered 2020-03-03 (×2): 2 mg via INTRAVENOUS
  Filled 2020-03-03 (×2): qty 1

## 2020-03-03 MED ORDER — FENTANYL CITRATE (PF) 100 MCG/2ML IJ SOLN
INTRAMUSCULAR | Status: DC | PRN
  Administered 2020-03-03 (×2): 50 ug via INTRAVENOUS

## 2020-03-03 MED ORDER — PROPOFOL 10 MG/ML IV BOLUS
INTRAVENOUS | Status: AC
  Filled 2020-03-03: qty 20

## 2020-03-03 MED ORDER — PHENYLEPHRINE HCL (PRESSORS) 10 MG/ML IV SOLN
INTRAVENOUS | Status: AC
  Filled 2020-03-03: qty 1

## 2020-03-03 MED ORDER — PHENOL 1.4 % MT LIQD
1.0000 | OROMUCOSAL | Status: DC | PRN
  Filled 2020-03-03: qty 177

## 2020-03-03 MED ORDER — ONDANSETRON HCL 4 MG/2ML IJ SOLN: 4.0000 mg | Freq: Four times a day (QID) | INTRAMUSCULAR | Status: DC | PRN

## 2020-03-03 MED ORDER — ZOLPIDEM TARTRATE 5 MG PO TABS: 5.0000 mg | ORAL_TABLET | Freq: Every evening | ORAL | Status: DC | PRN

## 2020-03-03 MED ORDER — ONDANSETRON HCL 4 MG PO TABS: 4.0000 mg | ORAL_TABLET | Freq: Four times a day (QID) | ORAL | Status: DC | PRN

## 2020-03-03 MED ORDER — ALUM & MAG HYDROXIDE-SIMETH 200-200-20 MG/5ML PO SUSP
15.0000 mL | Freq: Four times a day (QID) | ORAL | Status: DC | PRN
  Administered 2020-03-03: 15 mL via ORAL
  Filled 2020-03-03: qty 30

## 2020-03-03 MED ORDER — METOCLOPRAMIDE HCL 5 MG PO TABS: 5.0000 mg | ORAL_TABLET | Freq: Three times a day (TID) | ORAL | Status: DC | PRN

## 2020-03-03 MED ORDER — MELATONIN 5 MG PO TABS
5.0000 mg | ORAL_TABLET | Freq: Every day | ORAL | Status: DC
  Administered 2020-03-04: 5 mg via ORAL
  Filled 2020-03-03: qty 1

## 2020-03-03 MED ORDER — ASPIRIN EC 81 MG PO TBEC
81.0000 mg | DELAYED_RELEASE_TABLET | Freq: Two times a day (BID) | ORAL | Status: DC
  Administered 2020-03-04 – 2020-03-05 (×3): 81 mg via ORAL
  Filled 2020-03-03 (×3): qty 1

## 2020-03-03 MED ORDER — FENTANYL CITRATE (PF) 100 MCG/2ML IJ SOLN
25.0000 ug | INTRAMUSCULAR | Status: DC | PRN
  Administered 2020-03-03: 25 ug via INTRAVENOUS
  Administered 2020-03-03: 50 ug via INTRAVENOUS

## 2020-03-03 MED ORDER — POLYETHYLENE GLYCOL 3350 17 G PO PACK
17.0000 g | PACK | Freq: Two times a day (BID) | ORAL | Status: DC
  Administered 2020-03-03 – 2020-03-05 (×3): 17 g via ORAL
  Filled 2020-03-03 (×4): qty 1

## 2020-03-03 MED ORDER — LIDOCAINE 2% (20 MG/ML) 5 ML SYRINGE
INTRAMUSCULAR | Status: DC | PRN
  Administered 2020-03-03: 40 mg via INTRAVENOUS

## 2020-03-03 MED ORDER — ENSURE PRE-SURGERY PO LIQD
296.0000 mL | Freq: Once | ORAL | Status: DC
  Filled 2020-03-03: qty 296

## 2020-03-03 MED ORDER — HYDROCODONE-ACETAMINOPHEN 5-325 MG PO TABS
1.0000 | ORAL_TABLET | ORAL | Status: DC | PRN
  Administered 2020-03-03 – 2020-03-04 (×2): 1 via ORAL
  Administered 2020-03-04 – 2020-03-05 (×3): 2 via ORAL
  Filled 2020-03-03 (×3): qty 2
  Filled 2020-03-03 (×2): qty 1

## 2020-03-03 MED ORDER — DEXAMETHASONE SODIUM PHOSPHATE 10 MG/ML IJ SOLN
INTRAMUSCULAR | Status: DC | PRN
  Administered 2020-03-03: 5 mg via INTRAVENOUS

## 2020-03-03 MED ORDER — METHOCARBAMOL 1000 MG/10ML IJ SOLN
500.0000 mg | Freq: Four times a day (QID) | INTRAVENOUS | Status: DC | PRN
  Filled 2020-03-03: qty 5

## 2020-03-03 MED ORDER — ONDANSETRON HCL 4 MG/2ML IJ SOLN
INTRAMUSCULAR | Status: DC | PRN
  Administered 2020-03-03: 4 mg via INTRAVENOUS

## 2020-03-03 MED ORDER — FERROUS SULFATE 325 (65 FE) MG PO TABS
325.0000 mg | ORAL_TABLET | Freq: Three times a day (TID) | ORAL | Status: DC
  Administered 2020-03-04 – 2020-03-05 (×5): 325 mg via ORAL
  Filled 2020-03-03 (×5): qty 1

## 2020-03-03 MED ORDER — HYDROMORPHONE HCL 1 MG/ML IJ SOLN: 1.0000 mg | INTRAMUSCULAR | Status: DC | PRN

## 2020-03-03 MED ORDER — SUGAMMADEX SODIUM 200 MG/2ML IV SOLN
INTRAVENOUS | Status: DC | PRN
  Administered 2020-03-03: 125 mg via INTRAVENOUS

## 2020-03-03 MED ORDER — CEFAZOLIN SODIUM-DEXTROSE 2-3 GM-%(50ML) IV SOLR
INTRAVENOUS | Status: DC | PRN
  Administered 2020-03-03: 2 g via INTRAVENOUS

## 2020-03-03 MED ORDER — LEVOTHYROXINE SODIUM 75 MCG PO TABS
75.0000 ug | ORAL_TABLET | Freq: Every day | ORAL | Status: DC
  Administered 2020-03-03 – 2020-03-05 (×3): 75 ug via ORAL
  Filled 2020-03-03 (×3): qty 1

## 2020-03-03 MED ORDER — SUGAMMADEX SODIUM 200 MG/2ML IV SOLN: INTRAVENOUS | Status: DC | PRN

## 2020-03-03 MED ORDER — PHENYLEPHRINE HCL-NACL 10-0.9 MG/250ML-% IV SOLN
INTRAVENOUS | Status: DC | PRN
  Administered 2020-03-03: 25 ug/min via INTRAVENOUS

## 2020-03-03 MED ORDER — EPHEDRINE 5 MG/ML INJ
INTRAVENOUS | Status: AC
  Filled 2020-03-03: qty 10

## 2020-03-03 MED ORDER — LIP MEDEX EX OINT
TOPICAL_OINTMENT | CUTANEOUS | Status: AC
  Administered 2020-03-03: 1
  Filled 2020-03-03: qty 7

## 2020-03-03 MED ORDER — DOCUSATE SODIUM 100 MG PO CAPS
100.0000 mg | ORAL_CAPSULE | Freq: Two times a day (BID) | ORAL | Status: DC
  Administered 2020-03-03 – 2020-03-05 (×4): 100 mg via ORAL
  Filled 2020-03-03 (×5): qty 1

## 2020-03-03 MED ORDER — SENNOSIDES-DOCUSATE SODIUM 8.6-50 MG PO TABS
2.0000 | ORAL_TABLET | Freq: Two times a day (BID) | ORAL | Status: DC
  Administered 2020-03-03 – 2020-03-05 (×5): 2 via ORAL
  Filled 2020-03-03 (×5): qty 2

## 2020-03-03 MED ORDER — FENTANYL CITRATE (PF) 100 MCG/2ML IJ SOLN: 100.0000 ug | Freq: Once | INTRAMUSCULAR | Status: DC | PRN

## 2020-03-03 MED ORDER — DEXAMETHASONE SODIUM PHOSPHATE 10 MG/ML IJ SOLN
INTRAMUSCULAR | Status: AC
  Filled 2020-03-03: qty 1

## 2020-03-03 MED ORDER — MENTHOL 3 MG MT LOZG: 1.0000 | LOZENGE | OROMUCOSAL | Status: DC | PRN

## 2020-03-03 MED ORDER — LIDOCAINE 2% (20 MG/ML) 5 ML SYRINGE
INTRAMUSCULAR | Status: AC
  Filled 2020-03-03: qty 5

## 2020-03-03 MED ORDER — SODIUM CHLORIDE 0.9 % IV SOLN: INTRAVENOUS | Status: DC

## 2020-03-03 MED ORDER — HYDROMORPHONE HCL 1 MG/ML IJ SOLN
0.5000 mg | INTRAMUSCULAR | Status: DC | PRN
  Administered 2020-03-05: 1 mg via INTRAVENOUS
  Filled 2020-03-03: qty 1

## 2020-03-03 MED ORDER — KETOROLAC TROMETHAMINE 15 MG/ML IJ SOLN
15.0000 mg | Freq: Four times a day (QID) | INTRAMUSCULAR | Status: DC | PRN
  Administered 2020-03-03: 15 mg via INTRAVENOUS
  Filled 2020-03-03: qty 1

## 2020-03-03 MED ORDER — ACETAMINOPHEN 325 MG PO TABS: 650.0000 mg | ORAL_TABLET | Freq: Four times a day (QID) | ORAL | Status: DC

## 2020-03-03 MED ORDER — PANTOPRAZOLE SODIUM 40 MG PO TBEC
40.0000 mg | DELAYED_RELEASE_TABLET | Freq: Every day | ORAL | Status: DC
  Administered 2020-03-03 – 2020-03-05 (×3): 40 mg via ORAL
  Filled 2020-03-03 (×3): qty 1

## 2020-03-03 MED ORDER — LORAZEPAM 0.5 MG PO TABS: 0.2500 mg | ORAL_TABLET | Freq: Every evening | ORAL | Status: DC | PRN

## 2020-03-03 MED ORDER — POLYETHYLENE GLYCOL 3350 17 G PO PACK: 17.0000 g | PACK | Freq: Every day | ORAL | Status: DC | PRN

## 2020-03-03 MED ORDER — PHENYLEPHRINE 40 MCG/ML (10ML) SYRINGE FOR IV PUSH (FOR BLOOD PRESSURE SUPPORT)
PREFILLED_SYRINGE | INTRAVENOUS | Status: DC | PRN
  Administered 2020-03-03: 40 ug via INTRAVENOUS

## 2020-03-03 MED ORDER — ACETAMINOPHEN 325 MG PO TABS: 650.0000 mg | ORAL_TABLET | Freq: Four times a day (QID) | ORAL | Status: DC | PRN

## 2020-03-03 MED ORDER — HYDROCODONE-ACETAMINOPHEN 7.5-325 MG PO TABS: 1.0000 | ORAL_TABLET | ORAL | Status: DC | PRN

## 2020-03-03 MED ORDER — TRAMADOL HCL 50 MG PO TABS: 50.0000 mg | ORAL_TABLET | Freq: Four times a day (QID) | ORAL | Status: DC | PRN

## 2020-03-03 MED ORDER — ONDANSETRON HCL 4 MG/2ML IJ SOLN
INTRAMUSCULAR | Status: AC
  Filled 2020-03-03: qty 2

## 2020-03-03 MED ORDER — FENTANYL CITRATE (PF) 100 MCG/2ML IJ SOLN
50.0000 ug | Freq: Once | INTRAMUSCULAR | Status: AC
  Administered 2020-03-03: 50 ug via INTRAVENOUS

## 2020-03-03 MED ORDER — METOCLOPRAMIDE HCL 5 MG/ML IJ SOLN: 5.0000 mg | Freq: Three times a day (TID) | INTRAMUSCULAR | Status: DC | PRN

## 2020-03-03 SURGICAL SUPPLY — 41 items
ADH SKN CLS APL DERMABOND .7 (GAUZE/BANDAGES/DRESSINGS) ×1
BAG SPEC THK2 15X12 ZIP CLS (MISCELLANEOUS)
BAG ZIPLOCK 12X15 (MISCELLANEOUS) IMPLANT
BIT DRILL CANN LG 4.3MM (BIT) IMPLANT
BNDG GAUZE ELAST 4 BULKY (GAUZE/BANDAGES/DRESSINGS) ×3 IMPLANT
COVER PERINEAL POST (MISCELLANEOUS) ×3 IMPLANT
COVER SURGICAL LIGHT HANDLE (MISCELLANEOUS) ×3 IMPLANT
COVER WAND RF STERILE (DRAPES) IMPLANT
DERMABOND ADVANCED (GAUZE/BANDAGES/DRESSINGS) ×2
DERMABOND ADVANCED .7 DNX12 (GAUZE/BANDAGES/DRESSINGS) IMPLANT
DRAPE STERI IOBAN 125X83 (DRAPES) ×3 IMPLANT
DRESSING AQUACEL AG SP 3.5X4 (GAUZE/BANDAGES/DRESSINGS) IMPLANT
DRILL BIT CANN LG 4.3MM (BIT) ×3
DRSG AQUACEL AG ADV 3.5X 4 (GAUZE/BANDAGES/DRESSINGS) IMPLANT
DRSG AQUACEL AG ADV 3.5X 6 (GAUZE/BANDAGES/DRESSINGS) ×2 IMPLANT
DRSG AQUACEL AG SP 3.5X4 (GAUZE/BANDAGES/DRESSINGS) ×3
DURAPREP 26ML APPLICATOR (WOUND CARE) ×3 IMPLANT
ELECT REM PT RETURN 15FT ADLT (MISCELLANEOUS) ×3 IMPLANT
GLOVE BIOGEL PI IND STRL 7.5 (GLOVE) ×1 IMPLANT
GLOVE BIOGEL PI IND STRL 8.5 (GLOVE) ×1 IMPLANT
GLOVE BIOGEL PI INDICATOR 7.5 (GLOVE) ×2
GLOVE BIOGEL PI INDICATOR 8.5 (GLOVE) ×2
GLOVE ECLIPSE 8.0 STRL XLNG CF (GLOVE) ×3 IMPLANT
GLOVE ORTHO TXT STRL SZ7.5 (GLOVE) ×3 IMPLANT
GOWN STRL REUS W/TWL LRG LVL3 (GOWN DISPOSABLE) ×3 IMPLANT
GOWN STRL REUS W/TWL XL LVL3 (GOWN DISPOSABLE) ×3 IMPLANT
GUIDEPIN 3.2X17.5 THRD DISP (PIN) ×2 IMPLANT
HIP FRAC NAIL LAG SCR 10.5X100 (Orthopedic Implant) ×3 IMPLANT
KIT BASIN OR (CUSTOM PROCEDURE TRAY) ×3 IMPLANT
KIT TURNOVER KIT A (KITS) IMPLANT
MANIFOLD NEPTUNE II (INSTRUMENTS) ×3 IMPLANT
NAIL HIP FRACT 130D 11X180 (Screw) ×2 IMPLANT
PACK GENERAL/GYN (CUSTOM PROCEDURE TRAY) ×3 IMPLANT
PENCIL SMOKE EVACUATOR (MISCELLANEOUS) IMPLANT
PROTECTOR NERVE ULNAR (MISCELLANEOUS) ×3 IMPLANT
SCREW BONE CORTICAL 5.0X3 (Screw) ×2 IMPLANT
SCREW CANN THRD AFF 10.5X100 (Orthopedic Implant) IMPLANT
SUT VIC AB 1 CT1 27 (SUTURE) ×3
SUT VIC AB 1 CT1 27XBRD ANTBC (SUTURE) ×1 IMPLANT
SUT VIC AB 2-0 CT1 27 (SUTURE) ×3
SUT VIC AB 2-0 CT1 TAPERPNT 27 (SUTURE) ×1 IMPLANT

## 2020-03-03 NOTE — Progress Notes (Signed)
Sara Davila, Sara Davila    Brief Narrative:  Davila was admitted with left intertrochanteric hip fracture.  86 year old female who presented with left hip pain, she does have a significant past medical history for hypothyroidism and osteoporosis.  Davila sustained a mechanical fall from her own height, she was working in her yard, after she the fall apparently a flowerpot fell on her head.  Post trauma she had significant pain, limiting her mobility.  On her initial physical examination blood pressure 149/93, heart rate 106, respiratory rate 16, oxygen saturation 94%.  Her lungs were clear to auscultation bilaterally, heart S1-S2 present rhythmic, abdomen soft, Sara lower extremity edema, she had tenderness to palpation over the left hip and femur. Sodium 135, potassium 4.4, chloride 97, bicarb 26, glucose 162, BUN 20, creatinine 0.80, white count 15.6, hemoglobin 12.6, hematocrit 37.7, platelets 324.  SARS COVID-19 was negative.  Urinalysis negative for infection.  Her chest radiograph had hyperinflation, Sara infiltrates.  Left hip had comminuted displaced intertrochanteric fracture.  Head CT Sara acute changes.  Davila has been placed on analgesics with good response, plan for orthopedic surgery this pm.   Assessment & Plan:   Principal Problem:   Displaced intertrochanteric fracture of left femur, initial encounter for closed fracture [facility]) Active Problems:   Fall   Hypothyroidism   S/P small bowel resection   Insomnia secondary to anxiety   GERD (gastroesophageal reflux disease)   Neuropathy   Osteoporosis   1. Left hip fracture/ osteoporosis. Will continue pain control and dvy prophylaxis, Davila will have surgical intervention today. Follow up with surgery recommendations, physical and occupational therapy evaluations.   2. Hypothyroid. Continue with levothyroxine  3. GERD. Continue antiacid therapy  with pantoprazole.   4. Neuropathy. Follow with physical therapy evaluation for ambulation.    DVT prophylaxis: enoxaparin   Code Status:  dnr  Family Communication: Sara family at the bedside   Disposition Plan/ discharge barriers: Davila from home, barrier for dc acute left hip fracture, needing surgical intervention     Consultants:   Orthopedics     Subjective: Davila with left hip pain worse with movement, improved with analgesics. Sara nausea or vomiting, Sara chest pain or dyspnea. Positive right face pain.   Objective: Vitals:   03/03/20 0100 03/03/20 0200 03/03/20 0402 03/03/20 0957  BP:  124/68 (!) 150/86 112/64  Pulse:  93 92 82  Resp: 16 19 16    Temp:   98.4 F (36.9 C) 97.9 F (36.6 C)  TempSrc:   Oral Oral  SpO2:  93% 98% 96%  Weight:      Height:        Intake/Output Summary (Last 24 hours) at 03/03/2020 1010 Last data filed at 03/03/2020 1006 Gross Davila 24 hour  Intake 110.3 ml  Output 0 ml  Net 110.3 ml   Filed Weights   03/02/20 2200  Weight: 41.7 kg    Examination:   General: Not in pain or dyspnea, deconditioned  Neurology: Awake and alert, non focal  E ENT: Sara pallor, Sara icterus, oral mucosa moist Cardiovascular: Sara JVD. S1-S2 present, rhythmic, Sara gallops, rubs, or murmurs. Sara lower extremity edema. Pulmonary: positive breath sounds bilaterally, adequate air movement, Sara wheezing, rhonchi or rales. Gastrointestinal. Abdomen distended but not tender, tympanic  Sara organomegaly, non tender, Sara rebound or guarding Skin. Sara rashes Musculoskeletal: positive shortening and external rotation of the left lower extremity.  Data Reviewed: I have personally reviewed following labs and imaging studies  CBC: Recent Labs  Lab 03/02/20 2255  WBC 15.6*  NEUTROABS 13.4*  HGB 12.6  HCT 37.7  MCV 98.2  PLT 0000000   Basic Metabolic Panel: Recent Labs  Lab 03/02/20 2255  NA 135  K 4.4  CL 97*  CO2 26  GLUCOSE 162*  BUN 20  CREATININE 0.80    CALCIUM 9.9   GFR: Estimated Creatinine Clearance: 32.6 mL/min (by C-G formula based on SCr of 0.8 mg/dL). Liver Function Tests: Sara results for input(s): AST, ALT, ALKPHOS, BILITOT, PROT, ALBUMIN in the last 168 hours. Sara results for input(s): LIPASE, AMYLASE in the last 168 hours. Sara results for input(s): AMMONIA in the last 168 hours. Coagulation Profile: Recent Labs  Lab 03/02/20 2255  INR 1.0   Cardiac Enzymes: Sara results for input(s): CKTOTAL, CKMB, CKMBINDEX, TROPONINI in the last 168 hours. BNP (last 3 results) Sara results for input(s): PROBNP in the last 8760 hours. HbA1C: Sara results for input(s): HGBA1C in the last 72 hours. CBG: Sara results for input(s): GLUCAP in the last 168 hours. Lipid Profile: Sara results for input(s): CHOL, HDL, LDLCALC, TRIG, CHOLHDL, LDLDIRECT in the last 72 hours. Thyroid Function Tests: Sara results for input(s): TSH, T4TOTAL, FREET4, T3FREE, THYROIDAB in the last 72 hours. Anemia Panel: Sara results for input(s): VITAMINB12, FOLATE, FERRITIN, TIBC, IRON, RETICCTPCT in the last 72 hours.    Radiology Studies: I have reviewed all of the imaging during this hospital visit personally     Scheduled Meds: . lip balm      . feeding supplement  296 mL Oral Once  . levothyroxine  75 mcg Oral QAC breakfast  . melatonin  5 mg Oral QHS  . pantoprazole  40 mg Oral Daily  . senna-docusate  2 tablet Oral BID   Continuous Infusions: . sodium chloride 75 mL/hr at 03/03/20 0915  . methocarbamol (ROBAXIN) IV       LOS: 0 days        Shawna Wearing Gerome Apley, MD

## 2020-03-03 NOTE — Anesthesia Procedure Notes (Signed)
Procedure Name: Intubation Date/Time: 03/03/2020 12:47 PM Performed by: Cynda Familia, CRNA Pre-anesthesia Checklist: Patient identified, Emergency Drugs available, Suction available and Patient being monitored Patient Re-evaluated:Patient Re-evaluated prior to induction Oxygen Delivery Method: Circle System Utilized Preoxygenation: Pre-oxygenation with 100% oxygen Induction Type: IV induction Ventilation: Mask ventilation without difficulty Laryngoscope Size: Miller and 2 Grade View: Grade I Tube type: Oral Number of attempts: 1 Airway Equipment and Method: Stylet Placement Confirmation: ETT inserted through vocal cords under direct vision,  positive ETCO2 and breath sounds checked- equal and bilateral Secured at: 21 cm Tube secured with: Tape Dental Injury: Teeth and Oropharynx as per pre-operative assessment  Comments: Smooth induction [place]-- intubation AM CRNA atraumatic-- teeth and mouth as preop -- chipping in front teeth-- bilat BS [employer]

## 2020-03-03 NOTE — Progress Notes (Signed)
Pt down to pacu in stable condition. No needs at time of transport though pain remained with movement.

## 2020-03-03 NOTE — Brief Op Note (Signed)
03/02/2020 - 03/03/2020  12:36 PM  PATIENT:  Sara Davila  86 y.o. female  PRE-OPERATIVE DIAGNOSIS:  left intertrochanteric fracture  POST-OPERATIVE DIAGNOSIS:  left intertrochanteric fracture  PROCEDURE:  Procedure(s): INTRAMEDULLARY (IM) NAIL FEMORAL (Left)  SURGEON:  Surgeon(s) and Role:    Paralee Cancel, MD - Primary  PHYSICIAN ASSISTANT: Griffith Citron, PA-C  ANESTHESIA:   general  EBL:  50 cc   BLOOD ADMINISTERED:none  DRAINS: none   LOCAL MEDICATIONS USED:  NONE  SPECIMEN:  No Specimen  DISPOSITION OF SPECIMEN:  N/A  COUNTS:  YES  TOURNIQUET:  * No tourniquets in log *  DICTATION: .Other Dictation: Dictation Number [id number]  PLAN OF CARE: Admit to inpatient   PATIENT DISPOSITION:  PACU - hemodynamically stable.   Delay start of Pharmacological VTE agent (>24hrs) due to surgical blood loss or risk of bleeding: no

## 2020-03-03 NOTE — Plan of Care (Signed)
  Problem: Pain Management: Goal: Pain level will decrease Outcome: Progressing   Problem: Activity: Goal: Ability to ambulate and perform ADLs will improve Outcome: Progressing   Problem: Clinical Measurements: Goal: Will remain free from infection Outcome: Progressing

## 2020-03-03 NOTE — ED Notes (Signed)
ED TO INPATIENT HANDOFF REPORT  ED Nurse Name and Phone #: Clarise Cruz [phone]  S Name/Age/Gender Sara Davila 86 y.o. female Room/Bed: WA01/WA01  Code Status   Code Status: Prior  Home/SNF/Other Skilled nursing facility Patient oriented to: self, place, time, situation Is this baseline? Yes   Triage Complete: Triage complete  Chief Complaint Displaced intertrochanteric fracture of left femur, initial encounter for closed fracture [facility]) [S72.142A]  Triage Note While on her patio at [facility] [place], the patient was hit in the head by a potted plant. The blow to the head caused her to fall to the ground injuring her left leg. EMS reports shortening of the left leg and 10/10 pain. 100 mcg of Fentanyl was administered.    EMS vitals: 146/78 BP 110 HR 98% O2 sat on room air      Allergies Allergies  Allergen Reactions  . Codeine Rash  . Penicillins Rash    Has patient had a PCN reaction causing immediate rash, facial/tongue/throat swelling, SOB or lightheadedness with hypotension: No Has patient had a PCN reaction causing severe rash involving mucus membranes or skin necrosis: No Has patient had a PCN reaction that required hospitalization: No Has patient had a PCN reaction occurring within the last 10 years: No If all of the above answers are "NO", then may proceed with Cephalosporin use.     Level of Care/Admitting Diagnosis ED Disposition    ED Disposition Condition [facility] Area: [place] [[id number]]  Level of Care: Med-Surg [16]  May admit patient to [facility] or [facility] if equivalent level of care is available:: Yes  Covid Evaluation: Asymptomatic Screening Protocol (No Symptoms)  Diagnosis: Displaced intertrochanteric fracture of left femur, initial encounter for closed fracture [facility]) [id number]  Admitting Physician: Chauncey Mann [id number]  Attending Physician: Chauncey Mann [phone]  Estimated length of  stay: past midnight tomorrow  Certification:: I certify this patient will need inpatient services for at least 2 midnights       B Medical/Surgery History Past Medical History:  Diagnosis Date  . Abdominal pain 02/26/2017  . Anxiety   . Arthritis    Left Knee, Wrist, Back and Hips  . Cervical vertebral fusion   . Diverticulitis   . Diverticulosis   . Fibromyalgia   . GERD (gastroesophageal reflux disease)   . Gout 02/2018   L hand  . Hypothyroidism   . Pelvis fracture ([place])   . Peritoneal free air 03/29/2012  . Pneumonia   . Pneumoperitoneum 02/26/2017  . Thyroid disorder   . Toe pain 09/06/2016   Past Surgical History:  Procedure Laterality Date  . ABDOMINAL HYSTERECTOMY    . BOWEL RESECTION N/A 07/09/2017   Procedure: SMALL BOWEL RESECTION;  Surgeon: Johnathan Hausen, MD;  Location: [facility];  Service: General;  Laterality: N/A;  . CERVICAL FUSION     x2  . LAPAROSCOPIC APPENDECTOMY N/A 03/04/2017   Procedure: APPENDECTOMY LAPAROSCOPIC;  Surgeon: Johnathan Hausen, MD;  Location: [facility];  Service: General;  Laterality: N/A;  . LAPAROSCOPY N/A 03/04/2017   Procedure: LAPAROSCOPY DIAGNOSTIC, ENTEROLYSIS;  Surgeon: Johnathan Hausen, MD;  Location: [facility];  Service: General;  Laterality: N/A;  . LAPAROTOMY N/A 07/09/2017   Procedure: EXPLORATORY LAPAROTOMY;  Surgeon: Johnathan Hausen, MD;  Location: [facility];  Service: General;  Laterality: N/A;  . SPINE SURGERY     Lumbar- rod placement  . TONSILLECTOMY     86y/o  . TOTAL VAGINAL HYSTERECTOMY  A IV Location/Drains/Wounds Patient Lines/Drains/Airways Status   Active Line/Drains/Airways    Name:   Placement date:   Placement time:   Site:   Days:   Incision (Closed) 07/09/17 Abdomen Other (Comment)   07/09/17    1207     968   Incision - 3 Ports Abdomen 1: Umbilicus 2: Left;Upper 3: Left;Lower   03/04/17    0820     1095          Intake/Output Last 24 hours No intake or output data in the 24 hours ending 03/03/20  0330  Labs/Imaging Results for orders placed or performed during the hospital encounter of 03/02/20 (from the past 48 hour(s))  Basic metabolic panel     Status: Abnormal   Collection Time: 03/02/20 10:55 PM  Result Value Ref Range   Sodium 135 135 - 145 mmol/L   Potassium 4.4 3.5 - 5.1 mmol/L    Comment: SLIGHT HEMOLYSIS   Chloride 97 (L) 98 - 111 mmol/L   CO2 26 22 - 32 mmol/L   Glucose, Bld 162 (H) 70 - 99 mg/dL    Comment: Glucose reference range applies only to samples taken after fasting for at least 8 hours.   BUN 20 8 - 23 mg/dL   Creatinine, Ser 0.80 0.44 - 1.00 mg/dL   Calcium 9.9 8.9 - 10.3 mg/dL   GFR calc non Af Amer >60 >60 mL/min   GFR calc Af Amer >60 >60 mL/min   Anion gap 12 5 - 15    Comment: Performed at [facility], [place] [address] [place], [place] [address]  CBC WITH DIFFERENTIAL     Status: Abnormal   Collection Time: 03/02/20 10:55 PM  Result Value Ref Range   WBC 15.6 (H) 4.0 - 10.5 K/uL   RBC 3.84 (L) 3.87 - 5.11 MIL/uL   Hemoglobin 12.6 12.0 - 15.0 g/dL   HCT 37.7 36.0 - 46.0 %   MCV 98.2 80.0 - 100.0 fL   MCH 32.8 26.0 - 34.0 pg   MCHC 33.4 30.0 - 36.0 g/dL   RDW 12.7 11.5 - 15.5 %   Platelets 324 150 - 400 K/uL   nRBC 0.0 0.0 - 0.2 %   Neutrophils Relative % 85 %   Neutro Abs 13.4 (H) 1.7 - 7.7 K/uL   Lymphocytes Relative 6 %   Lymphs Abs 0.9 0.7 - 4.0 K/uL   Monocytes Relative 6 %   Monocytes Absolute 1.0 0.1 - 1.0 K/uL   Eosinophils Relative 1 %   Eosinophils Absolute 0.2 0.0 - 0.5 K/uL   Basophils Relative 1 %   Basophils Absolute 0.1 0.0 - 0.1 K/uL   Immature Granulocytes 1 %   Abs Immature Granulocytes 0.08 (H) 0.00 - 0.07 K/uL    Comment: Performed at [facility], [place] [address], [place], [place] [address]  Protime-INR     Status: None   Collection Time: 03/02/20 10:55 PM  Result Value Ref Range   Prothrombin Time 12.9 11.4 - 15.2 seconds   INR 1.0 0.8 - 1.2    Comment: (NOTE) INR goal  varies based on device and disease states. Performed at [facility], [place] [address], [place],  [address]    CT HEAD WO CONTRAST  Result Date: 03/02/2020 CLINICAL DATA:  Head trauma, struck in head by a potted plant causing patient to fall to ground EXAM: CT HEAD WITHOUT CONTRAST TECHNIQUE: Contiguous axial images were obtained from the base of  the skull through the vertex without intravenous contrast. Sagittal and coronal MPR images reconstructed from axial data set. COMPARISON:  03/25/2012 FINDINGS: Brain: Generalized atrophy. Normal ventricular morphology. No midline shift or mass effect. Small vessel chronic ischemic changes of deep cerebral white matter. No intracranial hemorrhage, mass lesion, evidence of acute infarction, or extra-axial fluid collection. Vascular: No hyperdense vessels Skull: Intact Sinuses/Orbits: Clear Other: N/A IMPRESSION: Atrophy with minimal small vessel chronic ischemic changes of deep cerebral white matter. No acute intracranial abnormalities. Electronically Signed   By: Lavonia Dana M.D.   On: 03/02/2020 23:22   DG Chest Port 1 View  Result Date: 03/02/2020 CLINICAL DATA:  LEFT hip pain post fall EXAM: PORTABLE CHEST 1 VIEW COMPARISON:  Portable exam 2317 hours compared to 12/01/2016 FINDINGS: Normal heart size, mediastinal contours, and pulmonary vascularity. Mild scarring at RIGHT base. Lungs otherwise clear. Calcified granuloma RIGHT mid lung. No infiltrate, pleural effusion, or pneumothorax. Bones demineralized. IMPRESSION: No acute abnormalities. Mild scarring RIGHT base. Electronically Signed   By: Lavonia Dana M.D.   On: 03/02/2020 23:24   DG Hip Unilat W or Wo Pelvis 2-3 Views Left  Result Date: 03/02/2020 CLINICAL DATA:  Struck in the head by a potted plant, fell to ground, LEFT hip pain EXAM: DG HIP (WITH OR WITHOUT PELVIS) 2-3V LEFT COMPARISON:  Pelvic radiograph 03/25/2012 FINDINGS: Osseous demineralization. Prior lumbosacral fusion.  Narrowing of hip joints bilaterally. SI joints preserved. Comminuted mildly displaced intertrochanteric fracture LEFT femur. No dislocation. Pelvis intact. IMPRESSION: Comminuted displaced intertrochanteric fracture LEFT femur. Electronically Signed   By: Lavonia Dana M.D.   On: 03/02/2020 23:23    Pending Labs Unresulted Labs (From admission, onward)    Start     Ordered   03/03/20 0239  Respiratory Panel by RT PCR (Flu A&B, Covid) - Nasopharyngeal Swab  (Tier 2 Respiratory Panel by RT PCR (Flu A&B, Covid) (TAT 2 hrs))  Once,   STAT    Question Answer Comment  Is this test for diagnosis or screening Screening   Symptomatic for COVID-19 as defined by CDC No   Hospitalized for COVID-19 No   Admitted to ICU for COVID-19 No   Previously tested for COVID-19 Unknown   Resident in a congregate (group) care setting Yes   Employed in healthcare setting No   Pregnant No      03/03/20 0238   03/02/20 2221  Type and screen [place],   STAT    Comments: [place]    03/02/20 2221   Signed and Held  CBC  Tomorrow morning,   R     Signed and Held   Signed and Held  Basic metabolic panel  Tomorrow morning,   R     Signed and Held          Vitals/Pain Today's Vitals   03/03/20 0028 03/03/20 0038 03/03/20 0100 03/03/20 0200  BP:  118/88  124/68  Pulse:  97  93  Resp:  18 16 19   Temp:      TempSrc:      SpO2:  94%  93%  Weight:      Height:      PainSc: 8        Isolation Precautions No active isolations  Medications Medications  fentaNYL (SUBLIMAZE) injection 100 mcg (has no administration in time range)  fentaNYL (SUBLIMAZE) injection 50 mcg (50 mcg Intravenous Given 03/03/20 0035)  fentaNYL (SUBLIMAZE) injection 50 mcg (50 mcg  Intravenous Given 03/03/20 0132)    Mobility non-ambulatory Low fall risk   Focused Assessments    R Recommendations: See Admitting Provider Note  Report given to: Estill Bamberg, RN  Additional  Notes:

## 2020-03-03 NOTE — Op Note (Signed)
NAMETORRY, DONA MEDICAL RECORD [id number] ACCOUNT [id number] DATE OF BIRTH:October 22, 1930 FACILITY: [facility] LOCATION: [facility] PHYSICIAN:Tylena Prisk Marian Sorrow, MD  OPERATIVE REPORT  DATE OF PROCEDURE:  03/03/2020  PREOPERATIVE DIAGNOSIS:  Left hip intertrochanteric femur fracture.  POSTOPERATIVE DIAGNOSIS:  Left hip intertrochanteric femur fracture.  PROCEDURE:  Open reduction internal fixation of left hip fracture using intramedullary nail, Biomet Affixus nail, 180 x 11 mm, 130-degree lag screw and a distal interlock.  SURGEON:  Paralee Cancel, MD  ASSISTANT:  Griffith Citron, PA-C.  Note that Ms. Nehemiah Settle was present for the entirety of the case from preoperative positioning, perioperative management of the operative extremity, general facilitation of the case and primary wound closure.  ANESTHESIA:  General.  BLOOD LOSS:  50 mL.  DRAINS:  None.  COMPLICATIONS:  None.  INDICATIONS:  The patient is a pleasant 86 year old female.  She is an independent resident at  [facility].  She unfortunately had a ground level fall and immediate onset of left hip pain.  She was brought to the emergency room where radiographic  workup revealed an intertrochanteric femur fracture.  Per protocol, she was admitted to the medical service.  Based on medical stability, she was scheduled for surgery.  We reviewed the risks of infection, nonunion, malunion, need for future surgeries.   We discussed the postoperative course.  Consent was obtained for benefit of fracture care and pain management.  DESCRIPTION OF PROCEDURE:  The patient was brought to the operative theater.  Once adequate anesthesia, preoperative antibiotics, Ancef administered, she was positioned supine on the fracture table.  All bony prominences were padded.  Foley catheter  placed.  Her well leg, the right lower extremity was flexed and abducted and placed onto the well leg holder with bony prominences padded, particularly over the lateral  aspect of the knee.  The left foot was placed in a traction boot.  Fluoroscopy was  utilized with internal rotation and traction, the fracture reduced to a near anatomic position.  She was noted to have an intertrochanteric segment with a sagittal plane obliquity.  The left hip was then prepped and draped in sterile fashion.  A timeout  was performed identifying the patient, the planned procedure and extremity.  Fluoroscopy was brought to the field.  Again, the tip of the trochanter was identified and an incision was made proximal over the lateral aspect of the hip.  Soft tissue  dissection was carried out and the gluteal fascia incised.  A guidewire was then inserted into the tip of the trochanter and then into the femur across the fracture site, confirmed radiographically.  The proximal femur was then opened with a drill.  The  nail was then passed by hand to its appropriate depth.  We then used the guide sleeve for the guidewire and placed it into the center of the femoral head in AP and lateral planes.  I measured and selected a 100 mm lag screw.  We drilled and then passed  the lag screw.  Gross traction was taken off the lower extremity and the traction wheel utilized to compress and medialized the shaft fracture.  The fracture was reduced to a near anatomic position and confirmed radiographically.  Based on the pattern, I  elected to lock this down as a fixed angle device as opposed to allow for further compression.  Once this was done, the distal interlock was placed at a 34 mm screw.  The jig was removed and final x-rays were obtained.  The wounds  were irrigated.  The  gluteal fascia was reapproximated using #1 Vicryl.  The remainder of the wounds was closed with 2-0 Vicryl and a running Monocryl stitch.  The wounds were cleaned, dried and dressed sterilely using surgical glue and Aquacel dressing.  She was then  extubated and brought to the recovery room in stable condition.  Postoperatively, we  will monitor her wounds.  We will allow her to be partial weightbearing.  We will use aspirin for DVT prophylaxis.  She will be evaluated in the hospital and needs for discharge determined.  VN/NUANCE  D:03/03/2020 T:03/03/2020 JOB:[id number]

## 2020-03-03 NOTE — Anesthesia Postprocedure Evaluation (Signed)
Anesthesia Post Note  Patient: Sara Davila  Procedure(s) Performed: INTRAMEDULLARY (IM) NAIL FEMORAL (Left Hip)     Patient location during evaluation: PACU Anesthesia Type: General Level of consciousness: awake and alert Pain management: pain level controlled Vital Signs Assessment: post-procedure vital signs reviewed and stable Respiratory status: spontaneous breathing, nonlabored ventilation, respiratory function stable and patient connected to nasal cannula oxygen Cardiovascular status: blood pressure returned to baseline and stable Postop Assessment: no apparent nausea or vomiting Anesthetic complications: no    Last Vitals:  Vitals:   03/03/20 1430 03/03/20 1445  BP: (!) 119/102 110/60  Pulse: 71 71  Resp: 16 (!) 22  Temp:    SpO2: 100% 98%    Last Pain:  Vitals:   03/03/20 1430  TempSrc:   PainSc: Asleep                 Effie Berkshire

## 2020-03-03 NOTE — Anesthesia Preprocedure Evaluation (Addendum)
Anesthesia Evaluation  Patient identified by MRN, date of birth, ID band Patient awake    Reviewed: Allergy & Precautions, NPO status , Patient's Chart, lab work & pertinent test results  Airway Mallampati: II  TM Distance: >3 FB Neck ROM: Full    Dental  (+) Teeth Intact, Dental Advisory Given, Chipped,    Pulmonary former smoker,    breath sounds clear to auscultation       Cardiovascular hypertension, Pt. on medications  Rhythm:Regular Rate:Normal     Neuro/Psych PSYCHIATRIC DISORDERS Anxiety    GI/Hepatic Neg liver ROS, GERD  Medicated,  Endo/Other  Hypothyroidism   Renal/GU negative Renal ROS     Musculoskeletal  (+) Arthritis , Fibromyalgia -  Abdominal Normal abdominal exam  (+)   Peds  Hematology   Anesthesia Other Findings   Reproductive/Obstetrics                           Anesthesia Physical Anesthesia Plan  ASA: III  Anesthesia Plan: General   Post-op Pain Management:    Induction: Intravenous  PONV Risk Score and Plan: 4 or greater and Ondansetron and Treatment may vary due to age or medical condition  Airway Management Planned: Oral ETT  Additional Equipment: None  Intra-op Plan:   Post-operative Plan: Extubation in OR  Informed Consent: I have reviewed the patients History and Physical, chart, labs and discussed the procedure including the risks, benefits and alternatives for the proposed anesthesia with the patient or authorized representative who has indicated his/her understanding and acceptance.   Patient has DNR.  Suspend DNR.   Dental advisory given  Plan Discussed with: CRNA  Anesthesia Plan Comments:        Anesthesia Quick Evaluation

## 2020-03-03 NOTE — Interval H&P Note (Signed)
History and Physical Interval Note:  03/03/2020 12:37 PM  Sara Davila  has presented today for surgery, with the diagnosis of left intertrochanteric fracture.  The various methods of treatment have been discussed with the patient and family. After consideration of risks, benefits and other options for treatment, the patient has consented to  Procedure(s): INTRAMEDULLARY (IM) NAIL FEMORAL (Left) as a surgical intervention.  The patient's history has been reviewed, patient examined, no change in status, stable for surgery.  I have reviewed the patient's chart and labs.  Questions were answered to the patient's satisfaction.     Mauri Pole

## 2020-03-03 NOTE — Transfer of Care (Signed)
Immediate Anesthesia Transfer of Care Note  Patient: Sara Davila  Procedure(s) Performed: INTRAMEDULLARY (IM) NAIL FEMORAL (Left Hip)  Patient Location: PACU  Anesthesia Type:General  Level of Consciousness: sedated  Airway & Oxygen Therapy: Patient Spontanous Breathing and Patient connected to face mask oxygen  Post-op Assessment: Report given to RN and Post -op Vital signs reviewed and stable  Post vital signs: Reviewed and stable  Last Vitals:  Vitals Value Taken Time  BP 134/88 03/03/20 1408  Temp    Pulse 78 03/03/20 1415  Resp 18 03/03/20 1415  SpO2 100 % 03/03/20 1415  Vitals shown include unvalidated device data.  Last Pain:  Vitals:   03/03/20 1019  TempSrc:   PainSc: 2       Patients Stated Pain Goal: 3 ([id number])  Complications: No apparent anesthesia complications

## 2020-03-03 NOTE — Anesthesia Procedure Notes (Signed)
Performed by: Cynda Familia, CRNA Oxygen Delivery Method: Simple face mask Placement Confirmation: breath sounds checked- equal and bilateral and positive ETCO2 Dental Injury: Teeth and Oropharynx as per pre-operative assessment

## 2020-03-03 NOTE — Discharge Instructions (Signed)
INSTRUCTIONS AFTER SURGERY  o Remove items at home which could result in a fall. This includes throw rugs or furniture in walking pathways o ICE to the affected joint every three hours while awake for 30 minutes at a time, for at least the first 3-5 days, and then as needed for pain and swelling.  Continue to use ice for pain and swelling. You may notice swelling that will progress down to the foot and ankle.  This is normal after surgery.  Elevate your leg when you are not up walking on it.   o Continue to use the breathing machine you got in the hospital (incentive spirometer) which will help keep your temperature down.  It is common for your temperature to cycle up and down following surgery, especially at night when you are not up moving around and exerting yourself.  The breathing machine keeps your lungs expanded and your temperature down.   DIET:  As you were doing prior to hospitalization, we recommend a well-balanced diet.  DRESSING / WOUND CARE / SHOWERING  Keep the surgical dressing until follow up.  The dressing is water proof, so you can shower without any extra covering.  IF THE DRESSING FALLS OFF or the wound gets wet inside, change the dressing with sterile gauze.  Please use good hand washing techniques before changing the dressing.  Do not use any lotions or creams on the incision until instructed by your surgeon.    ACTIVITY  o Increase activity slowly as tolerated, but follow the weight bearing instructions below.   o No driving for 6 weeks or until further direction given by your physician.  You cannot drive while taking narcotics.  o No lifting or carrying greater than 10 lbs. until further directed by your surgeon. o Avoid periods of inactivity such as sitting longer than an hour when not asleep. This helps prevent blood clots.  o You may return to work once you are authorized by your doctor.     WEIGHT BEARING   Partial weight bearing with assist device as directed.   .   CONSTIPATION  Constipation is defined medically as fewer than three stools per week and severe constipation as less than one stool per week.  Even if you have a regular bowel pattern at home, your normal regimen is likely to be disrupted due to multiple reasons following surgery.  Combination of anesthesia, postoperative narcotics, change in appetite and fluid intake all can affect your bowels.   YOU MUST use at least one of the following options; they are listed in order of increasing strength to get the job done.  They are all available over the counter, and you may need to use some, POSSIBLY even all of these options:    Drink plenty of fluids (prune juice may be helpful) and high fiber foods Colace 100 mg by mouth twice a day  Senokot for constipation as directed and as needed Dulcolax (bisacodyl), take with full glass of water  Miralax (polyethylene glycol) once or twice a day as needed.  If you have tried all these things and are unable to have a bowel movement in the first 3-4 days after surgery call either your surgeon or your primary doctor.    If you experience loose stools or diarrhea, hold the medications until you stool forms back up.  If your symptoms do not get better within 1 week or if they get worse, check with your doctor.  If you experience "the worst abdominal  pain ever" or develop nausea or vomiting, please contact the office immediately for further recommendations for treatment.   ITCHING:  If you experience itching with your medications, try taking only a single pain pill, or even half a pain pill at a time.  You can also use Benadryl over the counter for itching or also to help with sleep.   TED HOSE STOCKINGS:  Use stockings on both legs until for at least 2 weeks or as directed by physician office. They may be removed at night for sleeping.  MEDICATIONS:  See your medication summary on the "After Visit Summary" that nursing will review with you.  You may have  some home medications which will be placed on hold until you complete the course of blood thinner medication.  It is important for you to complete the blood thinner medication as prescribed.  PRECAUTIONS:  If you experience chest pain or shortness of breath - call 911 immediately for transfer to the hospital emergency department.   If you develop a fever greater that 101 F, purulent drainage from wound, increased redness or drainage from wound, foul odor from the wound/dressing, or calf pain - CONTACT YOUR SURGEON.                                                   FOLLOW-UP APPOINTMENTS:  If you do not already have a post-op appointment, please call the office for an appointment to be seen by your surgeon.  Guidelines for how soon to be seen are listed in your "After Visit Summary", but are typically between 1-4 weeks after surgery.  MAKE SURE YOU:  . Understand these instructions.  . Get help right away if you are not doing well or get worse.    Thank you for letting us be a part of your medical care team.  It is a privilege we respect greatly.  We hope these instructions will help you stay on track for a fast and full recovery!

## 2020-03-03 NOTE — Progress Notes (Signed)
Subjective: Patient reports pain as moderate.   Patient seen in rounds for Dr. Alvan Dame.  Patient is resting in bed fairly comfortably on exam this morning. She reports that morphine and fentanyl have not been adequately controlling her pain. She does have a history of allergy with rash to codeine, but is unsure of the severity as she has not taken it in many years. She reports she lives at [facility], and is typically very independent. She does ambulate with a walker when she is going any distance. She is not on blood thinners at home. She does have a history of significant back surgery, and takes Tramadol PRN for pain at home. She has seen Dr. Alvan Dame in the past for treatment of pelvic fractures and knee osteoarthritis.   Objective: Vital signs in last 24 hours: Temp:  [98 F (36.7 C)-98.4 F (36.9 C)] 98.4 F (36.9 C) (04/04 0402) Pulse Rate:  [92-106] 92 (04/04 0402) Resp:  [16-19] 16 (04/04 0402) BP: (118-159)/(68-93) 150/86 (04/04 0402) SpO2:  [93 %-98 %] 98 % (04/04 0402) Weight:  [41.7 kg] 41.7 kg (04/03 2200)  Intake/Output from previous day:  Intake/Output Summary (Last 24 hours) at 03/03/2020 0926 Last data filed at 03/03/2020 0915 Gross per 24 hour  Intake 110.3 ml  Output --  Net 110.3 ml     Intake/Output this shift: Total I/O In: 110.3 [I.V.:110.3] Out: -   Labs: Recent Labs    03/02/20 2255  HGB 12.6   Recent Labs    03/02/20 2255  WBC 15.6*  RBC 3.84*  HCT 37.7  PLT 324   Recent Labs    03/02/20 2255  NA 135  K 4.4  CL 97*  CO2 26  BUN 20  CREATININE 0.80  GLUCOSE 162*  CALCIUM 9.9   Recent Labs    03/02/20 2255  INR 1.0    Exam: General - Patient is Alert and Oriented Extremity - Left Hip Exam: LLE shortened and externally rotated. Skin intact. No abrasions or ecchymosis noted.   Sensation intact distally Intact pulses distally Dorsiflexion/Plantar flexion intact Motor Function - intact, moving foot and toes well on exam.  Past  Medical History:  Diagnosis Date  . Abdominal pain 02/26/2017  . Anxiety   . Arthritis    Left Knee, Wrist, Back and Hips  . Cervical vertebral fusion   . Diverticulitis   . Diverticulosis   . Fibromyalgia   . GERD (gastroesophageal reflux disease)   . Gout 02/2018   L hand  . Hypothyroidism   . Pelvis fracture ([place])   . Peritoneal free air 03/29/2012  . Pneumonia   . Pneumoperitoneum 02/26/2017  . Thyroid disorder   . Toe pain 09/06/2016    Assessment/Plan:   Procedure(s) (LRB): INTRAMEDULLARY (IM) NAIL FEMORAL (Left) Principal Problem:   Displaced intertrochanteric fracture of left femur, initial encounter for closed fracture [facility]) Active Problems:   Fall   Hypothyroidism   S/P small bowel resection   Insomnia secondary to anxiety   GERD (gastroesophageal reflux disease)   Neuropathy   Osteoporosis  Estimated body mass index is 17.97 kg/m as calculated from the following:   Height as of this encounter: 5' (1.524 m).   Weight as of this encounter: 41.7 kg.  Assessment: Displaced intertrochanteric femur fracture, left hip   Plan: Ms. Mueller is an established patient of Dr. Aurea Graff, and has been seen previously for knee OA and pelvic fracture. We discussed her diagnosis of left hip fracture, as well as  our plan for surgical intervention. We will plan for left hip ORIF with intramedullary nail this afternoon by Dr. Alvan Dame. She agrees with this plan. She will remain NPO. We have adjusted her pain medication to IV Dilaudid and IV methocarbamol for now. Remain nonweightbearing.   Griffith Citron, PA-C Orthopedic Surgery [phone] 03/03/2020, 9:26 AM

## 2020-03-03 NOTE — Consult Note (Signed)
Reason for Consult: Left hip fracture Referring Physician: EDP  HPI: Sara Davila is an 86 y.o. female in overall good health, [facility], status post mechanical fall yesterday with immediate complaints of left hip pain and inability to bear weight.  She has been followed at [facility] in the past and has seen Dr. Alvan Dame for a pelvic fracture as well as knee arthritis.  Patient does report difficulties with constipation and does use stool softeners and laxatives routinely.  Past Medical History:  Diagnosis Date  . Abdominal pain 02/26/2017  . Anxiety   . Arthritis    Left Knee, Wrist, Back and Hips  . Cervical vertebral fusion   . Diverticulitis   . Diverticulosis   . Fibromyalgia   . GERD (gastroesophageal reflux disease)   . Gout 02/2018   L hand  . Hypothyroidism   . Pelvis fracture ([place])   . Peritoneal free air 03/29/2012  . Pneumonia   . Pneumoperitoneum 02/26/2017  . Thyroid disorder   . Toe pain 09/06/2016    Past Surgical History:  Procedure Laterality Date  . ABDOMINAL HYSTERECTOMY    . BOWEL RESECTION N/A 07/09/2017   Procedure: SMALL BOWEL RESECTION;  Surgeon: Johnathan Hausen, MD;  Location: [facility];  Service: General;  Laterality: N/A;  . CERVICAL FUSION     x2  . LAPAROSCOPIC APPENDECTOMY N/A 03/04/2017   Procedure: APPENDECTOMY LAPAROSCOPIC;  Surgeon: Johnathan Hausen, MD;  Location: [facility];  Service: General;  Laterality: N/A;  . LAPAROSCOPY N/A 03/04/2017   Procedure: LAPAROSCOPY DIAGNOSTIC, ENTEROLYSIS;  Surgeon: Johnathan Hausen, MD;  Location: [facility];  Service: General;  Laterality: N/A;  . LAPAROTOMY N/A 07/09/2017   Procedure: EXPLORATORY LAPAROTOMY;  Surgeon: Johnathan Hausen, MD;  Location: [facility];  Service: General;  Laterality: N/A;  . SPINE SURGERY     Lumbar- rod placement  . TONSILLECTOMY     86y/o  . TOTAL VAGINAL HYSTERECTOMY      Family History  Problem Relation Age of Onset  . Asthma Mother   . Pulmonary embolism Mother   .  Macular degeneration Mother   . Heart disease Father   . Stroke Father   . Stroke Paternal Uncle   . Breast cancer Maternal Aunt   . Macular degeneration Sister   . Stroke Sister   . Neuropathy Neg Hx     Social History:  reports that she has quit smoking. She has never used smokeless tobacco. She reports current alcohol use. She reports that she does not use drugs.  Allergies:  Allergies  Allergen Reactions  . Codeine Rash  . Penicillins Rash    Has patient had a PCN reaction causing immediate rash, facial/tongue/throat swelling, SOB or lightheadedness with hypotension: No Has patient had a PCN reaction causing severe rash involving mucus membranes or skin necrosis: No Has patient had a PCN reaction that required hospitalization: No Has patient had a PCN reaction occurring within the last 10 years: No If all of the above answers are "NO", then may proceed with Cephalosporin use.     Medications: I have reviewed the patient's current medications.  Results for orders placed or performed during the hospital encounter of 03/02/20 (from the past 48 hour(s))  Basic metabolic panel     Status: Abnormal   Collection Time: 03/02/20 10:55 PM  Result Value Ref Range   Sodium 135 135 - 145 mmol/L   Potassium 4.4 3.5 - 5.1 mmol/L    Comment: SLIGHT HEMOLYSIS   Chloride  97 (L) 98 - 111 mmol/L   CO2 26 22 - 32 mmol/L   Glucose, Bld 162 (H) 70 - 99 mg/dL    Comment: Glucose reference range applies only to samples taken after fasting for at least 8 hours.   BUN 20 8 - 23 mg/dL   Creatinine, Ser 0.80 0.44 - 1.00 mg/dL   Calcium 9.9 8.9 - 10.3 mg/dL   GFR calc non Af Amer >60 >60 mL/min   GFR calc Af Amer >60 >60 mL/min   Anion gap 12 5 - 15    Comment: Performed at [facility], [place] [address] [place], [place] [address]  CBC WITH DIFFERENTIAL     Status: Abnormal   Collection Time: 03/02/20 10:55 PM  Result Value Ref Range   WBC 15.6 (H) 4.0 - 10.5 K/uL   RBC 3.84  (L) 3.87 - 5.11 MIL/uL   Hemoglobin 12.6 12.0 - 15.0 g/dL   HCT 37.7 36.0 - 46.0 %   MCV 98.2 80.0 - 100.0 fL   MCH 32.8 26.0 - 34.0 pg   MCHC 33.4 30.0 - 36.0 g/dL   RDW 12.7 11.5 - 15.5 %   Platelets 324 150 - 400 K/uL   nRBC 0.0 0.0 - 0.2 %   Neutrophils Relative % 85 %   Neutro Abs 13.4 (H) 1.7 - 7.7 K/uL   Lymphocytes Relative 6 %   Lymphs Abs 0.9 0.7 - 4.0 K/uL   Monocytes Relative 6 %   Monocytes Absolute 1.0 0.1 - 1.0 K/uL   Eosinophils Relative 1 %   Eosinophils Absolute 0.2 0.0 - 0.5 K/uL   Basophils Relative 1 %   Basophils Absolute 0.1 0.0 - 0.1 K/uL   Immature Granulocytes 1 %   Abs Immature Granulocytes 0.08 (H) 0.00 - 0.07 K/uL    Comment: Performed at [facility], [place] [address] [place], [place] [address]  Protime-INR     Status: None   Collection Time: 03/02/20 10:55 PM  Result Value Ref Range   Prothrombin Time 12.9 11.4 - 15.2 seconds   INR 1.0 0.8 - 1.2    Comment: (NOTE) INR goal varies based on device and disease states. Performed at [facility], [place] [address], [place], [place] [address]   Type and screen Roberta     Status: None   Collection Time: 03/02/20 10:55 PM  Result Value Ref Range   ABO/RH(D) A NEG    Antibody Screen NEG    Sample Expiration      03/05/2020,2359 Performed at [facility], [place] [address] [place], [place] [address]   Respiratory Panel by RT PCR (Flu A&B, Covid) - Nasopharyngeal Swab     Status: None   Collection Time: 03/03/20  3:06 AM   Specimen: Nasopharyngeal Swab  Result Value Ref Range   SARS Coronavirus 2 by RT PCR NEGATIVE NEGATIVE    Comment: (NOTE) SARS-CoV-2 target nucleic acids are NOT DETECTED. The SARS-CoV-2 RNA is generally detectable in upper respiratoy specimens during the acute phase of infection. The lowest concentration of SARS-CoV-2 viral copies this assay can detect is 131 copies/mL. A negative result does not  preclude SARS-Cov-2 infection and should not be used as the sole basis for treatment or other patient management decisions. A negative result may occur with  improper specimen collection/handling, submission of specimen other than nasopharyngeal swab, presence of viral mutation(s) within the areas targeted by this assay, and inadequate number of viral  copies (<131 copies/mL). A negative result must be combined with clinical observations, patient history, and epidemiological information. The expected result is Negative. Fact Sheet for Patients:  PinkCheek.be Fact Sheet for Healthcare Providers:  GravelBags.it This test is not yet ap proved or cleared by the [place] FDA and  has been authorized for detection and/or diagnosis of SARS-CoV-2 by FDA under an Emergency Use Authorization (EUA). This EUA will remain  in effect (meaning this test can be used) for the duration of the COVID-19 declaration under Section 564(b)(1) of the Act, 21 U.S.C. section 360bbb-3(b)(1), unless the authorization is terminated or revoked sooner.    Influenza A by PCR NEGATIVE NEGATIVE   Influenza B by PCR NEGATIVE NEGATIVE    Comment: (NOTE) The Xpert Xpress SARS-CoV-2/FLU/RSV assay is intended as an aid in  the diagnosis of influenza from Nasopharyngeal swab specimens and  should not be used as a sole basis for treatment. Nasal washings and  aspirates are unacceptable for Xpert Xpress SARS-CoV-2/FLU/RSV  testing. Fact Sheet for Patients: PinkCheek.be Fact Sheet for Healthcare Providers: GravelBags.it This test is not yet approved or cleared by the [place] FDA and  has been authorized for detection and/or diagnosis of SARS-CoV-2 by  FDA under an Emergency Use Authorization (EUA). This EUA will remain  in effect (meaning this test can be used) for the duration of the  Covid-19  declaration under Section 564(b)(1) of the Act, 21  U.S.C. section 360bbb-3(b)(1), unless the authorization is  terminated or revoked. Performed at [facility], [place] [address], [place], [place] [address]     CT HEAD WO CONTRAST  Result Date: 03/02/2020 CLINICAL DATA:  Head trauma, struck in head by a potted plant causing patient to fall to ground EXAM: CT HEAD WITHOUT CONTRAST TECHNIQUE: Contiguous axial images were obtained from the base of the skull through the vertex without intravenous contrast. Sagittal and coronal MPR images reconstructed from axial data set. COMPARISON:  03/25/2012 FINDINGS: Brain: Generalized atrophy. Normal ventricular morphology. No midline shift or mass effect. Small vessel chronic ischemic changes of deep cerebral white matter. No intracranial hemorrhage, mass lesion, evidence of acute infarction, or extra-axial fluid collection. Vascular: No hyperdense vessels Skull: Intact Sinuses/Orbits: Clear Other: N/A IMPRESSION: Atrophy with minimal small vessel chronic ischemic changes of deep cerebral white matter. No acute intracranial abnormalities. Electronically Signed   By: Lavonia Dana M.D.   On: 03/02/2020 23:22   DG Chest Port 1 View  Result Date: 03/02/2020 CLINICAL DATA:  LEFT hip pain post fall EXAM: PORTABLE CHEST 1 VIEW COMPARISON:  Portable exam 2317 hours compared to 12/01/2016 FINDINGS: Normal heart size, mediastinal contours, and pulmonary vascularity. Mild scarring at RIGHT base. Lungs otherwise clear. Calcified granuloma RIGHT mid lung. No infiltrate, pleural effusion, or pneumothorax. Bones demineralized. IMPRESSION: No acute abnormalities. Mild scarring RIGHT base. Electronically Signed   By: Lavonia Dana M.D.   On: 03/02/2020 23:24   DG Hip Unilat W or Wo Pelvis 2-3 Views Left  Result Date: 03/02/2020 CLINICAL DATA:  Struck in the head by a potted plant, fell to ground, LEFT hip pain EXAM: DG HIP (WITH OR WITHOUT PELVIS) 2-3V LEFT  COMPARISON:  Pelvic radiograph 03/25/2012 FINDINGS: Osseous demineralization. Prior lumbosacral fusion. Narrowing of hip joints bilaterally. SI joints preserved. Comminuted mildly displaced intertrochanteric fracture LEFT femur. No dislocation. Pelvis intact. IMPRESSION: Comminuted displaced intertrochanteric fracture LEFT femur. Electronically Signed   By: Lavonia Dana M.D.   On: 03/02/2020 23:23     Vitals Temp:  [  98 F (36.7 C)-98.4 F (36.9 C)] 98.4 F (36.9 C) (04/04 0402) Pulse Rate:  [92-106] 92 (04/04 0402) Resp:  [16-19] 16 (04/04 0402) BP: (118-159)/(68-93) 150/86 (04/04 0402) SpO2:  [93 %-98 %] 98 % (04/04 0402) Weight:  [41.7 kg] 41.7 kg (04/03 2200) Body mass index is 17.97 kg/m.  Physical Exam: Patient is a thin female who is alert and oriented.  She reports that she was actually hit on the back of her head by a falling plant when she hurt her hip.  Palpation about the neck and head reveals no local tenderness on today's exam.  She demonstrates full and pain-free motion of both upper extremities with good grip strength and a nonfocal neurologic exam.  Abdomen is nontender.  Right lower extremity demonstrates no gross bone or joint instability.  Left lower extremity demonstrates restricted mobility secondary to hip pain.  Global tender to palpation of the hip.  Grossly neurovascular intact.  Radiographs of the left hip demonstrate a basicervical/intertrochanteric proximal femoral fracture.  Multilevel hardware noted in the lumbar spine.     Assessment/Plan: Impression:  1.  Mechanical fall with left proximal femoral fracture, basicervical/intertrochanteric.   Treatment:  I have counseled Ms. Zeiders regarding today's findings.  We have reviewed the natural history and treatment options.  We have discussed the need for definitive surgical management.  I will reach out to my partners who specializes in management of hip fractures and make definitive arrangements for surgical  treatment.  Continue n.p.o.  IV fluids initiated.  Add IV morphine for pain control.  Kyrin Gratz M Amica Harron 03/03/2020, 7:30 AM  Contact # [phone]

## 2020-03-03 NOTE — H&P (View-Only) (Signed)
Subjective: Patient reports pain as moderate.   Patient seen in rounds for Dr. Alvan Dame.  Patient is resting in bed fairly comfortably on exam this morning. She reports that morphine and fentanyl have not been adequately controlling her pain. She does have a history of allergy with rash to codeine, but is unsure of the severity as she has not taken it in many years. She reports she lives at [facility], and is typically very independent. She does ambulate with a walker when she is going any distance. She is not on blood thinners at home. She does have a history of significant back surgery, and takes Tramadol PRN for pain at home. She has seen Dr. Alvan Dame in the past for treatment of pelvic fractures and knee osteoarthritis.   Objective: Vital signs in last 24 hours: Temp:  [98 F (36.7 C)-98.4 F (36.9 C)] 98.4 F (36.9 C) (04/04 0402) Pulse Rate:  [92-106] 92 (04/04 0402) Resp:  [16-19] 16 (04/04 0402) BP: (118-159)/(68-93) 150/86 (04/04 0402) SpO2:  [93 %-98 %] 98 % (04/04 0402) Weight:  [41.7 kg] 41.7 kg (04/03 2200)  Intake/Output from previous day:  Intake/Output Summary (Last 24 hours) at 03/03/2020 0926 Last data filed at 03/03/2020 0915 Gross per 24 hour  Intake 110.3 ml  Output --  Net 110.3 ml     Intake/Output this shift: Total I/O In: 110.3 [I.V.:110.3] Out: -   Labs: Recent Labs    03/02/20 2255  HGB 12.6   Recent Labs    03/02/20 2255  WBC 15.6*  RBC 3.84*  HCT 37.7  PLT 324   Recent Labs    03/02/20 2255  NA 135  K 4.4  CL 97*  CO2 26  BUN 20  CREATININE 0.80  GLUCOSE 162*  CALCIUM 9.9   Recent Labs    03/02/20 2255  INR 1.0    Exam: General - Patient is Alert and Oriented Extremity - Left Hip Exam: LLE shortened and externally rotated. Skin intact. No abrasions or ecchymosis noted.   Sensation intact distally Intact pulses distally Dorsiflexion/Plantar flexion intact Motor Function - intact, moving foot and toes well on exam.  Past  Medical History:  Diagnosis Date  . Abdominal pain 02/26/2017  . Anxiety   . Arthritis    Left Knee, Wrist, Back and Hips  . Cervical vertebral fusion   . Diverticulitis   . Diverticulosis   . Fibromyalgia   . GERD (gastroesophageal reflux disease)   . Gout 02/2018   L hand  . Hypothyroidism   . Pelvis fracture ([place])   . Peritoneal free air 03/29/2012  . Pneumonia   . Pneumoperitoneum 02/26/2017  . Thyroid disorder   . Toe pain 09/06/2016    Assessment/Plan:   Procedure(s) (LRB): INTRAMEDULLARY (IM) NAIL FEMORAL (Left) Principal Problem:   Displaced intertrochanteric fracture of left femur, initial encounter for closed fracture [facility]) Active Problems:   Fall   Hypothyroidism   S/P small bowel resection   Insomnia secondary to anxiety   GERD (gastroesophageal reflux disease)   Neuropathy   Osteoporosis  Estimated body mass index is 17.97 kg/m as calculated from the following:   Height as of this encounter: 5' (1.524 m).   Weight as of this encounter: 41.7 kg.  Assessment: Displaced intertrochanteric femur fracture, left hip   Plan: Ms. Fullman is an established patient of Dr. Aurea Graff, and has been seen previously for knee OA and pelvic fracture. We discussed her diagnosis of left hip fracture, as well as  our plan for surgical intervention. We will plan for left hip ORIF with intramedullary nail this afternoon by Dr. Alvan Dame. She agrees with this plan. She will remain NPO. We have adjusted her pain medication to IV Dilaudid and IV methocarbamol for now. Remain nonweightbearing.   Griffith Citron, PA-C Orthopedic Surgery [phone] 03/03/2020, 9:26 AM

## 2020-03-03 NOTE — H&P (Signed)
[facility] History and Physical  JAY WENDORFF [id number] DOB: [birth year] DOA: 03/02/2020  Referring EDP: Junious Silk, PA PCP: Patient, No Pcp Per   Chief Complaint: Left hip pain  HPI: Sara Davila is a 86 y.o. female with a PMH of hypothyroidism and osteoporosis presented to ED after a fall and found to have left femur fracture.   Patient reports she was in her usual state of health until yesterday afternoon when she went to cover up some plants on her patio due to the cold weather. Reports she tripped over the blanket and fell down and subsequently a flower pot fell and hit her head as well. Reports severe pain in left hip since that time that is "worse than childbirth." She has not taken anything for the pain. Reports constant pain but worse with movement. She feels her leg is "stuck in place" and she cannot move it. Reports some constipation which she always has but otherwise denies any complaints. Denies headache, dizziness, fever, chills, cough, SOB, chest pain, abdominal pain, nausea, vomiting, diarrhea, dysuria, hematuria, hematochezia, melena, speech difficulty, trouble eating, confusion or any other complaints.  In the ED: Hemodynamically stable. Labs remarkable for: WBC 15.6 otherwise CBC WNL. BMP and INR WNL.  CT Head: Atrophy with minimal small vessel chronic ischemic changes of deep cerebral white matter. No acute intracranial abnormalities. Left Hip XR: Comminuted displaced intertrochanteric fracture LEFT femur. CXR: No acute cardiopulmonary abnormalities.  Patient was given Fentanyl. Ortho consulted and requested patient remain NPO and they will follow later today.   Review of Systems:  All other systems negative unless noted above in HPI.   Past Medical History:  Diagnosis Date  . Abdominal pain 02/26/2017  . Anxiety   . Arthritis    Left Knee, Wrist, Back and Hips  . Cervical vertebral fusion   . Diverticulitis   . Diverticulosis   . Fibromyalgia   .  GERD (gastroesophageal reflux disease)   . Gout 02/2018   L hand  . Hypothyroidism   . Pelvis fracture ([place])   . Peritoneal free air 03/29/2012  . Pneumonia   . Pneumoperitoneum 02/26/2017  . Thyroid disorder   . Toe pain 09/06/2016   Past Surgical History:  Procedure Laterality Date  . ABDOMINAL HYSTERECTOMY    . BOWEL RESECTION N/A 07/09/2017   Procedure: SMALL BOWEL RESECTION;  Surgeon: Johnathan Hausen, MD;  Location: [facility];  Service: General;  Laterality: N/A;  . CERVICAL FUSION     x2  . LAPAROSCOPIC APPENDECTOMY N/A 03/04/2017   Procedure: APPENDECTOMY LAPAROSCOPIC;  Surgeon: Johnathan Hausen, MD;  Location: [facility];  Service: General;  Laterality: N/A;  . LAPAROSCOPY N/A 03/04/2017   Procedure: LAPAROSCOPY DIAGNOSTIC, ENTEROLYSIS;  Surgeon: Johnathan Hausen, MD;  Location: [facility];  Service: General;  Laterality: N/A;  . LAPAROTOMY N/A 07/09/2017   Procedure: EXPLORATORY LAPAROTOMY;  Surgeon: Johnathan Hausen, MD;  Location: [facility];  Service: General;  Laterality: N/A;  . SPINE SURGERY     Lumbar- rod placement  . TONSILLECTOMY     86y/o  . TOTAL VAGINAL HYSTERECTOMY     Social History:  reports that she has quit smoking. She has never used smokeless tobacco. She reports current alcohol use. She reports that she does not use drugs.  Allergies  Allergen Reactions  . Codeine Rash  . Penicillins Rash    Has patient had a PCN reaction causing immediate rash, facial/tongue/throat swelling, SOB or lightheadedness with hypotension: No Has patient had a PCN reaction causing  severe rash involving mucus membranes or skin necrosis: No Has patient had a PCN reaction that required hospitalization: No Has patient had a PCN reaction occurring within the last 10 years: No If all of the above answers are "NO", then may proceed with Cephalosporin use.     Family History  Problem Relation Age of Onset  . Asthma Mother   . Pulmonary embolism Mother   . Macular degeneration Mother   . Heart  disease Father   . Stroke Father   . Stroke Paternal Uncle   . Breast cancer Maternal Aunt   . Macular degeneration Sister   . Stroke Sister   . Neuropathy Neg Hx      Prior to Admission medications   Medication Sig Start Date End Date Taking? Authorizing Provider  Apoaequorin (PREVAGEN PO) Take by mouth daily.   Yes [provider]  Calcium Carbonate-Vitamin D (CALCIUM 500/D PO) Take 500 mg by mouth daily.   Yes [provider]  Carboxymethylcellulose Sodium (THERATEARS OP) Place 1 drop into both eyes 2 (two) times daily.    Yes [provider]  hydrochlorothiazide (MICROZIDE) 12.5 MG capsule Take 12.5 mg by mouth 2 (two) times a week. Monday & Friday. 02/18/18  Yes [provider]  ibandronate (BONIVA) 150 MG tablet Take 150 mg by mouth every 30 (thirty) days. Take in the morning with a full glass of water, on an empty stomach, and do not take anything else by mouth or lie down for the next 30 min.   Yes [provider]  levothyroxine (SYNTHROID, LEVOTHROID) 75 MCG tablet Take 75 mcg by mouth daily before breakfast.    Yes [provider]  LORazepam (ATIVAN) 0.5 MG tablet Take 0.25 mg by mouth at bedtime as needed for anxiety or sleep.    Yes [provider]  Melatonin 5 MG TABS Take 5 mg by mouth at bedtime.   Yes [provider]  Multiple Vitamins-Minerals (HAIR SKIN NAILS PO) Take by mouth daily.   Yes [provider]  omeprazole (PRILOSEC) 20 MG capsule Take 20 mg by mouth daily as needed.    Yes [provider]  polyethylene glycol (MIRALAX / GLYCOLAX) 17 g packet Take 17 g by mouth daily as needed.   Yes [provider]  Vitamin D, Cholecalciferol, 50 MCG (2000 UT) CAPS Take 1 tablet by mouth daily.   Yes [provider]  zolpidem (AMBIEN) 10 MG tablet Take 5 mg by mouth at bedtime as needed for sleep.   Yes [provider]  Acetaminophen (TYLENOL EXTRA STRENGTH PO) Take  by mouth as needed.    [provider]  Probiotic Product (PROBIOTIC PO) Take by mouth. Dose unknown    [provider]  traMADol (ULTRAM) 50 MG tablet Take 1 tablet (50 mg total) by mouth as needed. 01/16/20   Mast, Man X, NP   Physical Exam: Vitals:   03/02/20 2245 03/03/20 0038 03/03/20 0100 03/03/20 0200  BP: (!) 149/93 118/88  124/68  Pulse: (!) 106 97  93  Resp:  18 16 19   Temp:      TempSrc:      SpO2: 96% 94%  93%  Weight:      Height:        Wt Readings from Last 3 Encounters:  03/02/20 41.7 kg  01/04/20 45.1 kg  03/30/18 44.5 kg    . General:  Appears calm and comfortable. AAOx4. Pleasant. Resting in bed.  . Eyes: EOMI,  normal lids, irises & conjunctiva . ENT: grossly normal hearing, lips & tongue . Neck: normal ROM . Cardiovascular: RRR, no m/r/g. No LE edema. Marland Kitchen Respiratory: CTA bilaterally, no w/r/r. Normal respiratory effort. . Abdomen: soft, ntnd . Skin: no rash or induration seen on limited exam . Musculoskeletal: tenderness to palpation over left hip and femur with limited ROM; extremities warm to touch bilaterally with intact dorsalis pedis pulses; sensation intact to light touch . Psychiatric: grossly normal mood and affect, speech fluent and appropriate . Neurologic: grossly non-focal.          Labs on Admission:  Basic Metabolic Panel: Recent Labs  Lab 03/02/20 2255  NA 135  K 4.4  CL 97*  CO2 26  GLUCOSE 162*  BUN 20  CREATININE 0.80  CALCIUM 9.9   Liver Function Tests: No results for input(s): AST, ALT, ALKPHOS, BILITOT, PROT, ALBUMIN in the last 168 hours. No results for input(s): LIPASE, AMYLASE in the last 168 hours. No results for input(s): AMMONIA in the last 168 hours. CBC: Recent Labs  Lab 03/02/20 2255  WBC 15.6*  NEUTROABS 13.4*  HGB 12.6  HCT 37.7  MCV 98.2  PLT 324   Cardiac Enzymes: No results for input(s): CKTOTAL, CKMB, CKMBINDEX, TROPONINI in the last 168 hours.  BNP (last 3 results) No results  for input(s): BNP in the last 8760 hours.  ProBNP (last 3 results) No results for input(s): PROBNP in the last 8760 hours.  CBG: No results for input(s): GLUCAP in the last 168 hours.  Radiological Exams on Admission: CT HEAD WO CONTRAST  Result Date: 03/02/2020 CLINICAL DATA:  Head trauma, struck in head by a potted plant causing patient to fall to ground EXAM: CT HEAD WITHOUT CONTRAST TECHNIQUE: Contiguous axial images were obtained from the base of the skull through the vertex without intravenous contrast. Sagittal and coronal MPR images reconstructed from axial data set. COMPARISON:  03/25/2012 FINDINGS: Brain: Generalized atrophy. Normal ventricular morphology. No midline shift or mass effect. Small vessel chronic ischemic changes of deep cerebral white matter. No intracranial hemorrhage, mass lesion, evidence of acute infarction, or extra-axial fluid collection. Vascular: No hyperdense vessels Skull: Intact Sinuses/Orbits: Clear Other: N/A IMPRESSION: Atrophy with minimal small vessel chronic ischemic changes of deep cerebral white matter. No acute intracranial abnormalities. Electronically Signed   By: Lavonia Dana M.D.   On: 03/02/2020 23:22   DG Chest Port 1 View  Result Date: 03/02/2020 CLINICAL DATA:  LEFT hip pain post fall EXAM: PORTABLE CHEST 1 VIEW COMPARISON:  Portable exam 2317 hours compared to 12/01/2016 FINDINGS: Normal heart size, mediastinal contours, and pulmonary vascularity. Mild scarring at RIGHT base. Lungs otherwise clear. Calcified granuloma RIGHT mid lung. No infiltrate, pleural effusion, or pneumothorax. Bones demineralized. IMPRESSION: No acute abnormalities. Mild scarring RIGHT base. Electronically Signed   By: Lavonia Dana M.D.   On: 03/02/2020 23:24   DG Hip Unilat W or Wo Pelvis 2-3 Views Left  Result Date: 03/02/2020 CLINICAL DATA:  Struck in the head by a potted plant, fell to ground, LEFT hip pain EXAM: DG HIP (WITH OR WITHOUT PELVIS) 2-3V LEFT COMPARISON:  Pelvic  radiograph 03/25/2012 FINDINGS: Osseous demineralization. Prior lumbosacral fusion. Narrowing of hip joints bilaterally. SI joints preserved. Comminuted mildly displaced intertrochanteric fracture LEFT femur. No dislocation. Pelvis intact. IMPRESSION: Comminuted displaced intertrochanteric fracture LEFT femur. Electronically Signed   By: Lavonia Dana M.D.   On: 03/02/2020 23:23    EKG: None  Assessment/Plan Principal Problem:   Displaced intertrochanteric  fracture of left femur, initial encounter for closed fracture [facility]) Active Problems:   Fall   Hypothyroidism   S/P small bowel resection   Insomnia secondary to anxiety   GERD (gastroesophageal reflux disease)   Neuropathy   Osteoporosis  86 y.o. female with a PMH of hypothyroidism and osteoporosis presented to ED after a fall and found to have left femur fracture.   Left Femur Fracture - mechanical fall in setting of osteoporosis  - Left Hip XR: Comminuted displaced intertrochanteric fracture LEFT femur - NPO - Ortho following and likely planning to go to OR today or tomorrow - Revised Cardiac Risk Index for Pre-Operative Risk: 3.9% - IV Fentanyl for pain control - PT/OT post-op  Anxiety and Insomnia - cont PTA Ativan PRN, Melatonin, Ambien  Constipation - Senokot-S and Miralax ordered - hx of small bowel resection  Hypothyroidism - cont PTA Synthroid  GERD - cont PTA PPI  Code Status: DNR. Okay with pressors, NIV, ICU-level care. Declines chest compressions and intubation.  DVT Prophylaxis: None; pre-op Family Communication: None Disposition Plan: Admit to inpatient. Patient is at high risk for further decompensation due to age and co-morbidities. Requiring IV pain control and surgery. Ortho following.   Time spent: 55 minutes  Chauncey Mann, MD [facility] Pager [phone]

## 2020-03-04 ENCOUNTER — Encounter: Payer: Self-pay | Admitting: *Deleted

## 2020-03-04 LAB — BASIC METABOLIC PANEL
Anion gap: 6 (ref 5–15)
BUN: 12 mg/dL (ref 8–23)
CO2: 22 mmol/L (ref 22–32)
Calcium: 8.2 mg/dL — ABNORMAL LOW (ref 8.9–10.3)
Chloride: 105 mmol/L (ref 98–111)
Creatinine, Ser: 0.58 mg/dL (ref 0.44–1.00)
GFR calc Af Amer: >60 mL/min (ref >60)
GFR calc non Af Amer: >60 mL/min (ref >60)
Glucose, Bld: 124 mg/dL — ABNORMAL HIGH (ref 70–99)
Potassium: 4.6 mmol/L (ref 3.5–5.1)
Sodium: 133 mmol/L — ABNORMAL LOW (ref 135–145)

## 2020-03-04 LAB — CBC
HCT: 23.2 % — ABNORMAL LOW (ref 36.0–46.0)
Hemoglobin: 7.7 g/dL — ABNORMAL LOW (ref 12.0–15.0)
MCH: 33.5 pg (ref 26.0–34.0)
MCHC: 33.2 g/dL (ref 30.0–36.0)
MCV: 100.9 fL — ABNORMAL HIGH (ref 80.0–100.0)
Platelets: 226 10*3/uL (ref 150–400)
RBC: 2.3 MIL/uL — ABNORMAL LOW (ref 3.87–5.11)
RDW: 12.9 % (ref 11.5–15.5)
WBC: 8.4 10*3/uL (ref 4.0–10.5)
nRBC: 0 % (ref 0.0–0.2)

## 2020-03-04 NOTE — NC FL2 (Signed)
[place] LEVEL OF CARE SCREENING TOOL     IDENTIFICATION  Patient Name: Sara Davila Birthdate: 08-[name]-1930 Sex: female Admission Date (Current Location): 03/02/2020  [facility] and [place] Number:  Herbalist and Address:  [facility],  [place] [address] [place]      Provider Number: [id number]  Attending Physician Name and Address:  Shelly Coss, MD  Relative Name and Phone Number:  Lobdell,Bee Daughter [phone]    Current Level of Care: Hospital Recommended Level of Care: [place] Prior Approval Number:    Date Approved/Denied:   PASRR Number: [id number]  Discharge Plan: SNF    Current Diagnoses: Patient Active Problem List   Diagnosis Date Noted  . Displaced intertrochanteric fracture of left femur, initial encounter for closed fracture ([place]) 03/03/2020  . Prediabetes 01/04/2020  . Memory deficit 01/04/2020  . Osteoporosis 01/04/2020  . History of vasculitis 01/04/2020  . Venous insufficiency 01/04/2020  . Neuropathy 03/30/2018  . Hyponatremia 07/21/2017  . Insomnia secondary to anxiety 07/21/2017  . GERD (gastroesophageal reflux disease) 07/21/2017  . S/P small bowel resection 07/09/2017  . Hypothyroidism 02/26/2017  . Paresthesias 09/06/2016  . Fall 03/29/2012    Orientation RESPIRATION BLADDER Height & Weight     Self, Time, Situation, Place  Normal Continent Weight: 92 lb (41.7 kg) Height:  5' (152.4 cm)  BEHAVIORAL SYMPTOMS/MOOD NEUROLOGICAL BOWEL NUTRITION STATUS      Continent Diet(Regular Diet)  AMBULATORY STATUS COMMUNICATION OF NEEDS Skin   Extensive Assist Verbally Surgical wounds                       Personal Care Assistance Level of Assistance  Bathing, Dressing, Feeding Bathing Assistance: Maximum assistance Feeding assistance: Independent Dressing Assistance: Maximum assistance     Functional Limitations Info  Sight, Hearing, Speech Sight Info:  Adequate Hearing Info: Adequate Speech Info: Adequate    SPECIAL CARE FACTORS FREQUENCY  PT (By licensed PT), OT (By licensed OT)     PT Frequency: 5x/week OT Frequency: 5x/week            Contractures Contractures Info: Not present    Additional Factors Info  Code Status, Allergies, Psychotropic Code Status Info: Fullcode Allergies Info: Allergies: Codeine, Penicillins           Current Medications (03/04/2020):  This is the current hospital active medication list Current Facility-Administered Medications  Medication Dose Route Frequency Provider Last Rate Last Admin  . 0.9 %  sodium chloride infusion   Intravenous Continuous Arrien, Jimmy Picket, MD 50 mL/hr at 03/04/20 0200 Rate Verify at 03/04/20 0200  . acetaminophen (TYLENOL) tablet 500 mg  500 mg Oral Q6H Maurice March, PA-C   500 mg at 03/04/20 0102  . alum & mag hydroxide-simeth (MAALOX/MYLANTA) 200-200-20 MG/5ML suspension 15 mL  15 mL Oral Q6H PRN Maurice March, PA-C   15 mL at 03/03/20 0427  . aspirin EC tablet 81 mg  81 mg Oral BID Maurice March, PA-C   81 mg at 03/04/20 0739  . docusate sodium (COLACE) capsule 100 mg  100 mg Oral BID Maurice March, PA-C   100 mg at 03/04/20 [id number]  . ferrous sulfate tablet 325 mg  325 mg Oral TID PC Maurice March, PA-C   325 mg at 03/04/20 [id number]  . HYDROcodone-acetaminophen (NORCO) 7.5-325 MG per tablet 1-2 tablet  1-2 tablet Oral Q4H PRN Maurice March, PA-C      .  HYDROcodone-acetaminophen (NORCO/VICODIN) 5-325 MG per tablet 1-2 tablet  1-2 tablet Oral Q4H PRN Maurice March, PA-C   1 tablet at 03/03/20 1555  . HYDROmorphone (DILAUDID) injection 0.5-1 mg  0.5-1 mg Intravenous Q2H PRN Maurice March, PA-C      . levothyroxine (SYNTHROID) tablet 75 mcg  75 mcg Oral QAC breakfast Maurice March, PA-C   75 mcg at 03/04/20 0106  . LORazepam (ATIVAN) tablet 0.25 mg  0.25 mg Oral QHS PRN Maurice March, PA-C      . melatonin tablet 5 mg  5 mg Oral QHS  Stinson, Ashley R, PA-C      . menthol-cetylpyridinium (CEPACOL) lozenge 3 mg  1 lozenge Oral PRN Griffith Citron R, PA-C       Or  . phenol (CHLORASEPTIC) mouth spray 1 spray  1 spray Mouth/Throat PRN Maurice March, PA-C      . methocarbamol (ROBAXIN) tablet 500 mg  500 mg Oral Q6H PRN Maurice March, PA-C   500 mg at 03/04/20 0740   Or  . methocarbamol (ROBAXIN) 500 mg in dextrose 5 % 50 mL IVPB  500 mg Intravenous Q6H PRN Maurice March, PA-C 100 mL/hr at 03/03/20 1430 500 mg at 03/03/20 1430  . metoCLOPramide (REGLAN) tablet 5-10 mg  5-10 mg Oral Q8H PRN Griffith Citron R, PA-C       Or  . metoCLOPramide (REGLAN) injection 5-10 mg  5-10 mg Intravenous Q8H PRN Maurice March, PA-C      . ondansetron (ZOFRAN) tablet 4 mg  4 mg Oral Q6H PRN Maurice March, PA-C       Or  . ondansetron (ZOFRAN) injection 4 mg  4 mg Intravenous Q6H PRN Maurice March, PA-C      . pantoprazole (PROTONIX) EC tablet 40 mg  40 mg Oral Daily Maurice March, PA-C   40 mg at 03/04/20 [id number]  . polyethylene glycol (MIRALAX / GLYCOLAX) packet 17 g  17 g Oral BID Paralee Cancel, MD   17 g at 03/04/20 0906  . senna-docusate (Senokot-S) tablet 2 tablet  2 tablet Oral BID Maurice March, [place]   2 tablet at 03/04/20 [id number]  . zolpidem (AMBIEN) tablet 5 mg  5 mg Oral QHS PRN Maurice March, PA-C         Discharge Medications: Please see discharge summary for a list of discharge medications.  Relevant Imaging Results:  Relevant Lab Results:   Additional Information [id number]  Lia Hopping, LCSW

## 2020-03-04 NOTE — Evaluation (Signed)
Physical Therapy Evaluation Patient Details Name: Sara Davila MRN: [id number] DOB: 11/14/1930 Today's Date: 03/04/2020   History of Present Illness  Patient is a 86 year old female with history of hypothyroidism, osteoporosis who presented with left hip pain.  She sustained a mechanical fall when she was working in her yard.  On presentation, she was hemodynamically stable.  X-ray showed comminuted displaced intertrochanteric fracture on the left.  She underwent intramedullary nailing of left femur on 03/03/2020.    Clinical Impression  Pt admitted with above diagnosis. Pt able to inch LLE to EOB without physical assistance. Pt educated on 50% WB on LLE and able to maintain precautions with standing and transfers using RW. Pt tolerates steps at bedside and transfers to bedside chair with min assist x2. Pt able to clear L foot from floor to step and increased WB in BUE with R foot progression to maintain WB precautions. Pt's O2 sat varies 85-100% with OOB mobility possibly due to poor pulse ox attachment, denies dizzienss and no SOB noted and O2 >92% on RA when sitting or standing statically. Pt requests to remain up in bedside chair at EOS, BLE elevated, ice on L hip and all needs in reach. Pt currently with functional limitations due to the deficits listed below (see PT Problem List). Pt will benefit from skilled PT to increase their independence and safety with mobility to allow discharge to the venue listed below.       Follow Up Recommendations SNF    Equipment Recommendations  3in1 (PT)    Recommendations for Other Services       Precautions / Restrictions Precautions Precautions: Fall Restrictions Weight Bearing Restrictions: Yes LLE Weight Bearing: Partial weight bearing LLE Partial Weight Bearing Percentage or Pounds: 50%      Mobility  Bed Mobility Overal bed mobility: Needs Assistance Bed Mobility: Supine to Sit     Supine to sit: Min guard;HOB elevated     General  bed mobility comments: elevated HOB and use of bedrail to come to EOB, slowly inching LLE to EOB without physical assistance, assist to remove linen from interfering with mobility  Transfers Overall transfer level: Needs assistance Equipment used: Rolling walker (2 wheeled) Transfers: Sit to/from [facility] Sit to Stand: Min assist Stand pivot transfers: Min assist       General transfer comment: min assist to power up using BUE, weight-shifted to RLE avoiding LLE secondary to pain  Ambulation/Gait Ambulation/Gait assistance: Min assist;+2 safety/equipment Gait Distance (Feet): 4 Feet Assistive device: Rolling walker (2 wheeled) Gait Pattern/deviations: Step-to pattern;Decreased weight shift to left;Shuffle;Antalgic Gait velocity: decreased   General Gait Details: limited to steps in room over to bedside chair, increased BUE weight-bearing with L foot progression, able to clear L foot from floor to step with LLE, pt unable to weight-bear 50% on LLE secondary to pain  Stairs            Wheelchair Mobility    Modified Rankin (Stroke Patients Only)       Balance Overall balance assessment: Needs assistance Sitting-balance support: Feet supported;No upper extremity supported Sitting balance-Leahy Scale: Good Sitting balance - Comments: seated EOB     Standing balance-Leahy Scale: Fair Standing balance comment: dependent on RW for balance/pain control                  Pertinent Vitals/Pain Pain Assessment: Faces Faces Pain Scale: Hurts whole lot Pain Location: LLE Pain Descriptors / Indicators: Grimacing;Guarding;Moaning Pain Intervention(s): Limited activity within patient's tolerance;Monitored  during session;Repositioned;Ice applied    Home Living Family/patient expects to be discharged to:: Private residence([facility] living) Living Arrangements: Alone Available Help at Discharge: Friend(s);Available PRN/intermittently Type of  Home: Apartment Home Access: Level entry     Home Layout: One level Home Equipment: Walker - 4 wheels;Cane - single point Additional Comments: [facility] from [facility]    Prior Function Level of Independence: Independent         Comments: Pt reports Ind with household ambulation, using rollator walker outside and in community. Pt reports Ind with bathing, dressing, cleaning and facility provides meals. Pt reports no longer driving.     Hand Dominance        Extremity/Trunk Assessment   Upper Extremity Assessment Upper Extremity Assessment: Overall WFL for tasks assessed    Lower Extremity Assessment Lower Extremity Assessment: RLE deficits/detail;LLE deficits/detail RLE Deficits / Details: AROM WNL, grossly 3+/5 LLE Deficits / Details: ankle and knee AROM WNL, hip AROM limited by pain; RLE grossly 3+/5    Cervical / Trunk Assessment Cervical / Trunk Assessment: Normal  Communication   Communication: No difficulties  Cognition Arousal/Alertness: Awake/alert Behavior During Therapy: WFL for tasks assessed/performed Overall Cognitive Status: Within Functional Limits for tasks assessed             General Comments      Exercises     Assessment/Plan    PT Assessment Patient needs continued PT services  PT Problem List Decreased strength;Decreased range of motion;Decreased activity tolerance;Decreased balance;Decreased mobility;Pain       PT Treatment Interventions DME instruction;Gait training;Functional mobility training;Therapeutic activities;Therapeutic exercise;Balance training;Neuromuscular re-education;Patient/family education;Manual techniques    PT Goals (Current goals can be found in the Care Plan section)  Acute Rehab PT Goals Patient Stated Goal: return home PT Goal Formulation: With patient Time For Goal Achievement: 03/18/20 Potential to Achieve Goals: Good    Frequency Min 3X/week   Barriers to discharge         Co-evaluation               AM-PAC PT "6 Clicks" Mobility  Outcome Measure Help needed turning from your back to your side while in a flat bed without using bedrails?: A Little Help needed moving from lying on your back to sitting on the side of a flat bed without using bedrails?: A Little Help needed moving to and from a bed to a chair (including a wheelchair)?: A Little Help needed standing up from a chair using your arms (e.g., wheelchair or bedside chair)?: A Little Help needed to walk in hospital room?: A Lot Help needed climbing 3-5 steps with a railing? : Total 6 Click Score: 15    End of Session Equipment Utilized During Treatment: Gait belt Activity Tolerance: Patient limited by pain Patient left: in chair;with call bell/phone within reach;with chair alarm set Nurse Communication: Mobility status;Weight bearing status PT Visit Diagnosis: Unsteadiness on feet (R26.81);Other abnormalities of gait and mobility (R26.89);Muscle weakness (generalized) (M62.81);History of falling (Z91.81)    Time: [id number] PT Time Calculation (min) (ACUTE ONLY): 21 min   Charges:   PT Evaluation $PT Eval Moderate Complexity: 1 Mod          Tori Lilyth Lawyer PT, DPT 03/04/20, 12:10 PM [phone]

## 2020-03-04 NOTE — Plan of Care (Signed)
  Problem: Education: Goal: Verbalization of understanding the information provided (i.e., activity precautions, restrictions, etc) will improve Outcome: Progressing Goal: Individualized Educational Video(s) Outcome: Progressing   Problem: Activity: Goal: Ability to ambulate and perform ADLs will improve Outcome: Progressing   Problem: Clinical Measurements: Goal: Postoperative complications will be avoided or minimized Outcome: Progressing   Problem: Self-Concept: Goal: Ability to maintain and perform role responsibilities to the fullest extent possible will improve Outcome: Progressing   Problem: Pain Management: Goal: Pain level will decrease Outcome: Progressing   Problem: Health Behavior/Discharge Planning: Goal: Ability to manage health-related needs will improve Outcome: Progressing   Problem: Clinical Measurements: Goal: Ability to maintain clinical measurements within normal limits will improve Outcome: Progressing Goal: Will remain free from infection Outcome: Progressing Goal: Diagnostic test results will improve Outcome: Progressing Goal: Respiratory complications will improve Outcome: Progressing   Problem: Activity: Goal: Risk for activity intolerance will decrease Outcome: Progressing   Problem: Nutrition: Goal: Adequate nutrition will be maintained Outcome: Progressing   Problem: Coping: Goal: Level of anxiety will decrease Outcome: Progressing   Problem: Elimination: Goal: Will not experience complications related to bowel motility Outcome: Progressing Goal: Will not experience complications related to urinary retention Outcome: Progressing   Problem: Pain Managment: Goal: General experience of comfort will improve Outcome: Progressing   Problem: Safety: Goal: Ability to remain free from injury will improve Outcome: Progressing   Problem: Skin Integrity: Goal: Risk for impaired skin integrity will decrease Outcome: Progressing

## 2020-03-04 NOTE — Progress Notes (Signed)
Patient ID: Sara Davila, female   DOB: October 27, 1930, 86 y.o.   MRN: [id number] Subjective: 1 Day Post-Op Procedure(s) (LRB): INTRAMEDULLARY (IM) NAIL FEMORAL (Left)    Patient reports pain as mild, "much better than yesterday morning".  Able to have a small bowel movement as well.  Objective:   VITALS:   Vitals:   03/04/20 0443 03/04/20 0719  BP: (!) 100/57 101/86  Pulse: 85 90  Resp: 16 16  Temp: 98.2 F (36.8 C) 97.7 F (36.5 C)  SpO2: 97% 100%    Neurovascular intact Incision: scant drainage on proximal dressing  LABS Recent Labs    03/02/20 2255 03/04/20 0452  HGB 12.6 7.7*  HCT 37.7 23.2*  WBC 15.6* 8.4  PLT 324 226    Recent Labs    03/02/20 2255 03/04/20 0452  NA 135 133*  K 4.4 4.6  BUN 20 12  CREATININE 0.80 0.58  GLUCOSE 162* 124*    Recent Labs    03/02/20 2255  INR 1.0     Assessment/Plan: 1 Day Post-Op Procedure(s) (LRB): INTRAMEDULLARY (IM) NAIL FEMORAL (Left)   Advance diet Up with therapy - PWB LLE  D/C Foley change to external if needed Bowel regimen while on pain meds D/C needs based on therapy assessment but likely ST SNF at [facility] with goal of transitioning back to independent living

## 2020-03-04 NOTE — TOC Progression Note (Signed)
Transition of Care [facility]) - Progression Note    Patient Details  Name: Sara Davila MRN: [id number] Date of Birth: 01/17/1930  Transition of Care [facility]) CM/SW [place], LCSW Phone Number: 03/04/2020, 2:56 PM  Clinical Narrative:    [facility] Social Worker Karlene Einstein confirm the facility will have bed for the patient at discharge for rehab.  CSW initiated the [facility] authorization for skilled rehab.   Expected Discharge Plan: Skilled Nursing Facility Barriers to Discharge: Ship broker, Continued Medical Work up  Expected Discharge Plan and Services Expected Discharge Plan: [place] In-house Referral: NA Discharge Planning Services: CM Consult Post Acute Care Choice: [place] Living arrangements for the past 2 months: [place]                                       Social Determinants of Health (SDOH) Interventions    Readmission Risk Interventions No flowsheet data found.

## 2020-03-04 NOTE — Progress Notes (Signed)
PROGRESS NOTE    Sara Davila  [id number] DOB: 1930-01-27 DOA: 03/02/2020 PCP: Patient, No Pcp Per   Brief Narrative:  Patient is a 86 year old female with history of hypothyroidism, osteoporosis who presented with left hip pain.  She sustained a mechanical fall when she was working in her yard.  On presentation, she was hemodynamically stable.  X-ray showed comminuted displaced intertrochanteric fracture on the left.  She underwent intramedullary nailing of left femur on 03/03/2020.  PT/OT evaluation pending.  Assessment & Plan:   Principal Problem:   Displaced intertrochanteric fracture of left femur, initial encounter for closed fracture [facility]) Active Problems:   Fall   Hypothyroidism   S/P small bowel resection   Insomnia secondary to anxiety   GERD (gastroesophageal reflux disease)   Neuropathy   Osteoporosis   Left hip fracture: Underwent left-sided intramedullary nailing of the femur.  Continue pain management, DVT prophylaxis with aspirin twice a day.  PT/OT will be consulted.  She is from independent living facility.  She might need skilled nursing facility on discharge. Continue bowel regimen.  Acute blood loss anemia: Hemoglobin dropped to 7.7.  Likely associated  with surgery.  We will continue to monitor CBC and transfuse if hemoglobin drops below 7.  Hypothyroidism: Continue Synthyroid  GERD: Continue PPI  Neuropathy: Follow-up with physical therapy.  Continue supportive care.          DVT prophylaxis: Aspirin Code Status: DNR Family Communication: None present at the bedside Disposition Plan: Patient is from home.  She manages close to facility on discharge.  PT/OT evaluation pending.   Consultants: Orthopedics  Procedures: Nailing of left femur  Antimicrobials:  Anti-infectives (From admission, onward)   Start     Dose/Rate Route Frequency Ordered Stop   03/03/20 1900  ceFAZolin (ANCEF) IVPB 2g/100 mL premix     2 g 200 mL/hr over 30 Minutes  Intravenous Every 6 hours 03/03/20 1538 03/04/20 0132   03/03/20 1317  ceFAZolin (ANCEF) 2-4 GM/100ML-% IVPB    Note to Pharmacy: Marquis Buggy   : cabinet override      03/03/20 1317 03/03/20 1843      Subjective: Patient seen and examined at the bedside this morning.  Hemodynamically stable.  Looks comfortable.  Alert and oriented.  Pain is well controlled.  She mentioned that a flower vase hit her head when she fell and she is feeling some discomfort on her right side of her head.  CT scan does not show any acute intracranial normalities, there are no bruises or ulcerations on the skull.  Objective: Vitals:   03/03/20 2116 03/04/20 0117 03/04/20 0443 03/04/20 0719  BP: (!) 97/51 (!) 96/58 (!) 100/57 101/86  Pulse: 77 81 85 90  Resp: 16 16 16 16   Temp: 97.6 F (36.4 C) 98 F (36.7 C) 98.2 F (36.8 C) 97.7 F (36.5 C)  TempSrc:    Oral  SpO2: 98% 96% 97% 100%  Weight:      Height:        Intake/Output Summary (Last 24 hours) at 03/04/2020 0750 Last data filed at 03/04/2020 0500 Gross per 24 hour  Intake 2350.81 ml  Output 1850 ml  Net 500.81 ml   Filed Weights   03/02/20 2200  Weight: 41.7 kg    Examination:  General exam: Pleasant elderly female.   HEENT:PERRL,Oral mucosa moist, Ear/Nose normal on gross exam Respiratory system: Bilateral equal air entry, normal vesicular breath sounds, no wheezes or crackles  Cardiovascular system: S1 & S2  heard, RRR. No JVD, murmurs, rubs, gallops or clicks. No pedal edema. Gastrointestinal system: Abdomen is nondistended, soft and nontender. No organomegaly or masses felt. Normal bowel sounds heard. Central nervous system: Alert and oriented. No focal neurological deficits. Extremities: No edema, no clubbing ,no cyanosis, clean surgical wound on the left thigh.   Skin: No rashes, lesions or ulcers,no icterus ,no pallor    Data Reviewed: I have personally reviewed following labs and imaging studies  CBC: Recent Labs  Lab  03/02/20 2255 03/04/20 0452  WBC 15.6* 8.4  NEUTROABS 13.4*  --   HGB 12.6 7.7*  HCT 37.7 23.2*  MCV 98.2 100.9*  PLT 324 [id number]   Basic Metabolic Panel: Recent Labs  Lab 03/02/20 2255 03/04/20 0452  NA 135 133*  K 4.4 4.6  CL 97* 105  CO2 26 22  GLUCOSE 162* 124*  BUN 20 12  CREATININE 0.80 0.58  CALCIUM 9.9 8.2*   GFR: Estimated Creatinine Clearance: 32.6 mL/min (by C-G formula based on SCr of 0.58 mg/dL). Liver Function Tests: No results for input(s): AST, ALT, ALKPHOS, BILITOT, PROT, ALBUMIN in the last 168 hours. No results for input(s): LIPASE, AMYLASE in the last 168 hours. No results for input(s): AMMONIA in the last 168 hours. Coagulation Profile: Recent Labs  Lab 03/02/20 2255  INR 1.0   Cardiac Enzymes: No results for input(s): CKTOTAL, CKMB, CKMBINDEX, TROPONINI in the last 168 hours. BNP (last 3 results) No results for input(s): PROBNP in the last 8760 hours. HbA1C: No results for input(s): HGBA1C in the last 72 hours. CBG: No results for input(s): GLUCAP in the last 168 hours. Lipid Profile: No results for input(s): CHOL, HDL, LDLCALC, TRIG, CHOLHDL, LDLDIRECT in the last 72 hours. Thyroid Function Tests: No results for input(s): TSH, T4TOTAL, FREET4, T3FREE, THYROIDAB in the last 72 hours. Anemia Panel: No results for input(s): VITAMINB12, FOLATE, FERRITIN, TIBC, IRON, RETICCTPCT in the last 72 hours. Sepsis Labs: No results for input(s): PROCALCITON, LATICACIDVEN in the last 168 hours.  Recent Results (from the past 240 hour(s))  Respiratory Panel by RT PCR (Flu A&B, Covid) - Nasopharyngeal Swab     Status: None   Collection Time: 03/03/20  3:06 AM   Specimen: Nasopharyngeal Swab  Result Value Ref Range Status   SARS Coronavirus 2 by RT PCR NEGATIVE NEGATIVE Final    Comment: (NOTE) SARS-CoV-2 target nucleic acids are NOT DETECTED. The SARS-CoV-2 RNA is generally detectable in upper respiratoy specimens during the acute phase of infection.  The lowest concentration of SARS-CoV-2 viral copies this assay can detect is 131 copies/mL. A negative result does not preclude SARS-Cov-2 infection and should not be used as the sole basis for treatment or other patient management decisions. A negative result may occur with  improper specimen collection/handling, submission of specimen other than nasopharyngeal swab, presence of viral mutation(s) within the areas targeted by this assay, and inadequate number of viral copies (<131 copies/mL). A negative result must be combined with clinical observations, patient history, and epidemiological information. The expected result is Negative. Fact Sheet for Patients:  PinkCheek.be Fact Sheet for Healthcare Providers:  GravelBags.it This test is not yet ap proved or cleared by the [place] FDA and  has been authorized for detection and/or diagnosis of SARS-CoV-2 by FDA under an Emergency Use Authorization (EUA). This EUA will remain  in effect (meaning this test can be used) for the duration of the COVID-19 declaration under Section 564(b)(1) of the Act, 21 U.S.C. section 360bbb-3(b)(1), unless the  authorization is terminated or revoked sooner.    Influenza A by PCR NEGATIVE NEGATIVE Final   Influenza B by PCR NEGATIVE NEGATIVE Final    Comment: (NOTE) The Xpert Xpress SARS-CoV-2/FLU/RSV assay is intended as an aid in  the diagnosis of influenza from Nasopharyngeal swab specimens and  should not be used as a sole basis for treatment. Nasal washings and  aspirates are unacceptable for Xpert Xpress SARS-CoV-2/FLU/RSV  testing. Fact Sheet for Patients: PinkCheek.be Fact Sheet for Healthcare Providers: GravelBags.it This test is not yet approved or cleared by the [place] FDA and  has been authorized for detection and/or diagnosis of SARS-CoV-2 by  FDA under an  Emergency Use Authorization (EUA). This EUA will remain  in effect (meaning this test can be used) for the duration of the  Covid-19 declaration under Section 564(b)(1) of the Act, 21  U.S.C. section 360bbb-3(b)(1), unless the authorization is  terminated or revoked. Performed at [facility], [place] [address] [place], [place] [address]   MRSA PCR Screening     Status: None   Collection Time: 03/03/20  7:24 AM   Specimen: Nasal Mucosa; Nasopharyngeal  Result Value Ref Range Status   MRSA by PCR NEGATIVE NEGATIVE Final    Comment:        The GeneXpert MRSA Assay (FDA approved for NASAL specimens only), is one component of a comprehensive MRSA colonization surveillance program. It is not intended to diagnose MRSA infection nor to guide or monitor treatment for MRSA infections. Performed at [facility], [place] [address] [place], [place] [address]          Radiology Studies: CT HEAD WO CONTRAST  Result Date: 03/02/2020 CLINICAL DATA:  Head trauma, struck in head by a potted plant causing patient to fall to ground EXAM: CT HEAD WITHOUT CONTRAST TECHNIQUE: Contiguous axial images were obtained from the base of the skull through the vertex without intravenous contrast. Sagittal and coronal MPR images reconstructed from axial data set. COMPARISON:  03/25/2012 FINDINGS: Brain: Generalized atrophy. Normal ventricular morphology. No midline shift or mass effect. Small vessel chronic ischemic changes of deep cerebral white matter. No intracranial hemorrhage, mass lesion, evidence of acute infarction, or extra-axial fluid collection. Vascular: No hyperdense vessels Skull: Intact Sinuses/Orbits: Clear Other: N/A IMPRESSION: Atrophy with minimal small vessel chronic ischemic changes of deep cerebral white matter. No acute intracranial abnormalities. Electronically Signed   By: Lavonia Dana M.D.   On: 03/02/2020 23:22   DG Chest Port 1 View  Result Date:  03/02/2020 CLINICAL DATA:  LEFT hip pain post fall EXAM: PORTABLE CHEST 1 VIEW COMPARISON:  Portable exam 2317 hours compared to 12/01/2016 FINDINGS: Normal heart size, mediastinal contours, and pulmonary vascularity. Mild scarring at RIGHT base. Lungs otherwise clear. Calcified granuloma RIGHT mid lung. No infiltrate, pleural effusion, or pneumothorax. Bones demineralized. IMPRESSION: No acute abnormalities. Mild scarring RIGHT base. Electronically Signed   By: Lavonia Dana M.D.   On: 03/02/2020 23:24   DG C-Arm 1-60 Min-No Report  Result Date: 03/03/2020 Fluoroscopy was utilized by the requesting physician.  No radiographic interpretation.   DG HIP OPERATIVE UNILAT W OR W/O PELVIS LEFT  Result Date: 03/03/2020 CLINICAL DATA:  Left hip intramedullary nail EXAM: OPERATIVE LEFT HIP (WITH PELVIS IF PERFORMED) 2 VIEWS TECHNIQUE: Fluoroscopic spot image(s) were submitted for interpretation post-operatively. COMPARISON:  None. FINDINGS: Intraoperative fluoroscopic views of the left hip demonstrating intramedullary nail fixation are submitted for review. IMPRESSION: Intraoperative fluoroscopic views of the left hip  demonstrating intramedullary nail fixation are submitted for review. Electronically Signed   By: Eddie Candle M.D.   On: 03/03/2020 14:04   DG Hip Unilat W or Wo Pelvis 2-3 Views Left  Result Date: 03/02/2020 CLINICAL DATA:  Struck in the head by a potted plant, fell to ground, LEFT hip pain EXAM: DG HIP (WITH OR WITHOUT PELVIS) 2-3V LEFT COMPARISON:  Pelvic radiograph 03/25/2012 FINDINGS: Osseous demineralization. Prior lumbosacral fusion. Narrowing of hip joints bilaterally. SI joints preserved. Comminuted mildly displaced intertrochanteric fracture LEFT femur. No dislocation. Pelvis intact. IMPRESSION: Comminuted displaced intertrochanteric fracture LEFT femur. Electronically Signed   By: Lavonia Dana M.D.   On: 03/02/2020 23:23        Scheduled Meds: . acetaminophen  500 mg Oral Q6H  .  aspirin EC  81 mg Oral BID  . docusate sodium  100 mg Oral BID  . ferrous sulfate  325 mg Oral TID PC  . levothyroxine  75 mcg Oral QAC breakfast  . melatonin  5 mg Oral QHS  . pantoprazole  40 mg Oral Daily  . polyethylene glycol  17 g Oral BID  . senna-docusate  2 tablet Oral BID   Continuous Infusions: . sodium chloride 50 mL/hr at 03/04/20 0200  . methocarbamol (ROBAXIN) IV 500 mg (03/03/20 1430)     LOS: 1 day    Time spent: 25 mins.More than 50% of that time was spent in counseling and/or coordination of care.      Shelly Coss, MD [facility] P4/03/2020, 7:50 AM

## 2020-03-04 NOTE — TOC Initial Note (Addendum)
Transition of Care [facility]) - Initial/Assessment Note    Patient Details  Name: Sara Davila MRN: [id number] Date of Birth: 11-06-1930  Transition of Care [facility]) CM/SW Contact:    Lia Hopping, [place] Phone Number: 03/04/2020, 9:56 AM  Clinical Narrative:                 CSW met the patient at bedside to discuss discharge plan for rehab. Patient lives in the [place] apartments at [facility]. Patient reports, " I fell on my balcony and had to crawl into my apartment to call for help." Patient sustained a fracture of left femur and underwent surgical intervention. Patient reports she will return to [facility] for rehab. Patient reports prior to her fall she was independent with all ADL's and walked with a walker only outside.   CSW reached out to SNF to coordinate discharge, left voicemail. Waiting for a return call.    Expected Discharge Plan: Skilled Nursing Facility Barriers to Discharge: Insurance Authorization, Continued Medical Work up   Patient Goals and CMS Choice Patient states their goals for this hospitalization and ongoing recovery are:: Go to rehab CMS Medicare.gov Compare Post Acute Care list provided to:: Patient Choice offered to / list presented to : NA(Patient is a resident of [place], will transiton to rehab section.)  Expected Discharge Plan and Services Expected Discharge Plan: [place] In-house Referral: NA Discharge Planning Services: CM Consult Post Acute Care Choice: [place] Living arrangements for the past 2 months: [place]                                      Prior Living Arrangements/Services Living arrangements for the past 2 months: [place] Lives with:: Self Patient language and need for interpreter reviewed:: No Do you feel safe going back to the place where you live?: Yes      Need for Family Participation in Patient Care: Yes  (Comment) Care giver support system in place?: Yes (comment) Current home services: DME Criminal Activity/Legal Involvement Pertinent to Current Situation/Hospitalization: No - Comment as needed  Activities of Daily Living Home Assistive Devices/Equipment: Walker (specify type) ADL Screening (condition at time of admission) Patient's cognitive ability adequate to safely complete daily activities?: Yes Is the patient deaf or have difficulty hearing?: No Does the patient have difficulty seeing, even when wearing glasses/contacts?: No Does the patient have difficulty concentrating, remembering, or making decisions?: No Patient able to express need for assistance with ADLs?: Yes Does the patient have difficulty dressing or bathing?: No Independently performs ADLs?: Yes (appropriate for developmental age) Does the patient have difficulty walking or climbing stairs?: No Weakness of Legs: Left Weakness of Arms/Hands: None  Permission Sought/Granted Permission sought to share information with : Case Manager, Chartered certified accountant granted to share information with : Yes, Verbal Permission Granted     Permission granted to share info w AGENCY: [facility]        Emotional Assessment Appearance:: Appears stated age Attitude/Demeanor/Rapport: Engaged Affect (typically observed): Accepting, Pleasant Orientation: : Oriented to Place, Oriented to Self, Oriented to  Time, Oriented to Situation Alcohol / Substance Use: Not Applicable Psych Involvement: No (comment)  Admission diagnosis:  Closed fracture of left hip, initial encounter ([place]) [S72.002A] Fall, initial encounter [W19.XXXA] Displaced intertrochanteric fracture of left femur, initial encounter for closed fracture [facility]) [S72.142A] Patient Active  Problem List   Diagnosis Date Noted  . Displaced intertrochanteric fracture of left femur, initial encounter for closed fracture ([place]) 03/03/2020  . Prediabetes  01/04/2020  . Memory deficit 01/04/2020  . Osteoporosis 01/04/2020  . History of vasculitis 01/04/2020  . Venous insufficiency 01/04/2020  . Neuropathy 03/30/2018  . Hyponatremia 07/21/2017  . Insomnia secondary to anxiety 07/21/2017  . GERD (gastroesophageal reflux disease) 07/21/2017  . S/P small bowel resection 07/09/2017  . Hypothyroidism 02/26/2017  . Paresthesias 09/06/2016  . Fall 03/29/2012   PCP:  Patient, No Pcp Per Pharmacy:   [place], [place] [address] Phone: [phone] Fax: [phone]  [facility], [place] [address] [place] [address] Phone: [phone] Fax: [phone]     Social Determinants of Health (SDOH) Interventions    Readmission Risk Interventions No flowsheet data found.

## 2020-03-05 LAB — CBC WITH DIFFERENTIAL/PLATELET
Abs Immature Granulocytes: 0.02 10*3/uL (ref 0.00–0.07)
Basophils Absolute: 0.1 10*3/uL (ref 0.0–0.1)
Basophils Relative: 1 %
Eosinophils Absolute: 0.6 10*3/uL — ABNORMAL HIGH (ref 0.0–0.5)
Eosinophils Relative: 8 %
HCT: 21.5 % — ABNORMAL LOW (ref 36.0–46.0)
Hemoglobin: 7 g/dL — ABNORMAL LOW (ref 12.0–15.0)
Immature Granulocytes: 0 %
Lymphocytes Relative: 27 %
Lymphs Abs: 1.9 10*3/uL (ref 0.7–4.0)
MCH: 32.9 pg (ref 26.0–34.0)
MCHC: 32.6 g/dL (ref 30.0–36.0)
MCV: 100.9 fL — ABNORMAL HIGH (ref 80.0–100.0)
Monocytes Absolute: 0.8 10*3/uL (ref 0.1–1.0)
Monocytes Relative: 11 %
Neutro Abs: 3.9 10*3/uL (ref 1.7–7.7)
Neutrophils Relative %: 53 %
Platelets: 220 10*3/uL (ref 150–400)
RBC: 2.13 MIL/uL — ABNORMAL LOW (ref 3.87–5.11)
RDW: 13.2 % (ref 11.5–15.5)
WBC: 7.2 10*3/uL (ref 4.0–10.5)
nRBC: 0 % (ref 0.0–0.2)

## 2020-03-05 LAB — BASIC METABOLIC PANEL
Anion gap: 4 — ABNORMAL LOW (ref 5–15)
BUN: 12 mg/dL (ref 8–23)
CO2: 24 mmol/L (ref 22–32)
Calcium: 8 mg/dL — ABNORMAL LOW (ref 8.9–10.3)
Chloride: 109 mmol/L (ref 98–111)
Creatinine, Ser: 0.61 mg/dL (ref 0.44–1.00)
GFR calc Af Amer: >60 mL/min (ref >60)
GFR calc non Af Amer: >60 mL/min (ref >60)
Glucose, Bld: 102 mg/dL — ABNORMAL HIGH (ref 70–99)
Potassium: 4.6 mmol/L (ref 3.5–5.1)
Sodium: 137 mmol/L (ref 135–145)

## 2020-03-05 LAB — PREPARE RBC (CROSSMATCH)

## 2020-03-05 MED ORDER — SODIUM CHLORIDE 0.9% IV SOLUTION: Freq: Once | INTRAVENOUS | Status: AC

## 2020-03-05 MED ORDER — FERROUS SULFATE 325 (65 FE) MG PO TABS: 325.0000 mg | ORAL_TABLET | Freq: Two times a day (BID) | ORAL | 0 refills | Status: DC

## 2020-03-05 MED ORDER — ASPIRIN 81 MG PO TBEC: 81.0000 mg | DELAYED_RELEASE_TABLET | Freq: Two times a day (BID) | ORAL | Status: DC

## 2020-03-05 MED ORDER — SENNOSIDES-DOCUSATE SODIUM 8.6-50 MG PO TABS: 2.0000 | ORAL_TABLET | Freq: Two times a day (BID) | ORAL | Status: DC

## 2020-03-05 MED ORDER — POLYETHYLENE GLYCOL 3350 17 G PO PACK: 17.0000 g | PACK | Freq: Every day | ORAL | 0 refills | Status: DC

## 2020-03-05 MED ORDER — HYDROCODONE-ACETAMINOPHEN 7.5-325 MG PO TABS: 1.0000 | ORAL_TABLET | ORAL | 0 refills | Status: DC | PRN

## 2020-03-05 NOTE — TOC Transition Note (Signed)
Transition of Care [facility]) - CM/SW Discharge Note   Patient Details  Name: [name] MRN: [id number] Date of Birth: [birth year]  Transition of Care [facility]) CM/SW Contact:  Lennart Pall, LCSW Phone Number: 03/05/2020, 3:31 PM   Clinical Narrative:   Pt medically cleared for d/c to SNF. Notified pt's step daughter listed on chart, Bee Casciano.  PTAR to pick up this afternoon.      Final next level of care: Skilled Nursing Facility Barriers to Discharge: Barriers Resolved   Patient Goals and CMS Choice Patient states their goals for this hospitalization and ongoing recovery are:: Go to rehab CMS Medicare.gov Compare Post Acute Care list provided to:: Patient Choice offered to / list presented to : NA(Patient is a resident of [place], will transiton to rehab section.)  Discharge Placement              Patient chooses bed at: [facility] Patient to be transferred to facility by: [facility] Name of family member notified: Bee Littman Patient and family notified of of transfer: 03/05/20  Discharge Plan and Services In-house Referral: NA Discharge Planning Services: CM Consult Post Acute Care Choice: [place]                               Social Determinants of Health (SDOH) Interventions     Readmission Risk Interventions No flowsheet data found.

## 2020-03-05 NOTE — TOC Progression Note (Signed)
Transition of Care [facility]) - Progression Note    Patient Details  Name: Sara Davila MRN: [id number] Date of Birth: Jul 19, 1930  Transition of Care [facility]) CM/SW Contact  Lennart Pall, LCSW Phone Number: 03/05/2020, 9:47 AM  Clinical Narrative:    MD with plan to transfuse this a.m. and anticipates d/c this afternoon. Have left VM for [facility] admissions.  Have received insurance auth for transfer.   Expected Discharge Plan: Skilled Nursing Facility Barriers to Discharge: Ship broker, Continued Medical Work up  Expected Discharge Plan and Services Expected Discharge Plan: [place] In-house Referral: NA Discharge Planning Services: CM Consult Post Acute Care Choice: [place] Living arrangements for the past 2 months: [place]                                       Social Determinants of Health (SDOH) Interventions    Readmission Risk Interventions No flowsheet data found.

## 2020-03-05 NOTE — Progress Notes (Signed)
OT Cancellation Note  Patient Details Name: Sara Davila MRN: [id number] DOB: 20-Oct-1930   Cancelled Treatment:    Reason Eval/Treat Not Completed: Other (comment)  Noted plan to SNF- will defer OT eval to SNF Kari Baars, [place] [phone] Office- [phone], Edwena Felty D 03/05/2020, 12:36 PM

## 2020-03-05 NOTE — Progress Notes (Signed)
Called report to [facility].

## 2020-03-05 NOTE — Progress Notes (Signed)
Subjective: 2 Days Post-Op Procedure(s) (LRB): INTRAMEDULLARY (IM) NAIL FEMORAL (Left) Patient reports pain as mild.   Patient seen in rounds for Dr. Alvan Dame. Patient is well, and has had no acute complaints or problems other than discomfort in the left hip. She is resting comfortably in bed on exam with family at bedside. She denies CP, SHOB, N/V. Voiding without difficulty, positive flatus.  We will continue therapy today.   Objective: Vital signs in last 24 hours: Temp:  [97.9 F (36.6 C)-98.9 F (37.2 C)] 98.9 F (37.2 C) (04/06 1428) Pulse Rate:  [79-100] 100 (04/06 1428) Resp:  [12-16] 12 (04/06 1428) BP: (113-148)/(59-73) 136/73 (04/06 1428) SpO2:  [94 %-98 %] 98 % (04/06 1428)  Intake/Output from previous day:  Intake/Output Summary (Last 24 hours) at 03/05/2020 1509 Last data filed at 03/05/2020 1428 Gross per 24 hour  Intake 1959.41 ml  Output 1300 ml  Net 659.41 ml     Intake/Output this shift: Total I/O In: 170 [P.O.:120; I.V.:50] Out: 100 [Urine:100]  Labs: Recent Labs    03/02/20 2255 03/04/20 0452 03/05/20 0437  HGB 12.6 7.7* 7.0*   Recent Labs    03/04/20 0452 03/05/20 0437  WBC 8.4 7.2  RBC 2.30* 2.13*  HCT 23.2* 21.5*  PLT 226 220   Recent Labs    03/04/20 0452 03/05/20 0437  NA 133* 137  K 4.6 4.6  CL 105 109  CO2 22 24  BUN 12 12  CREATININE 0.58 0.61  GLUCOSE 124* 102*  CALCIUM 8.2* 8.0*   Recent Labs    03/02/20 2255  INR 1.0    Exam: General - Patient is Alert and Oriented Extremity - Neurologically intact Sensation intact distally Intact pulses distally Dorsiflexion/Plantar flexion intact Dressing - dressing C/D/I. Small area of bloody drainage noted on the aquacel.  Motor Function - intact, moving foot and toes well on exam.   Past Medical History:  Diagnosis Date  . Abdominal pain 02/26/2017  . Anxiety   . Arthritis    Left Knee, Wrist, Back and Hips  . Cervical vertebral fusion   . Diverticulitis   .  Diverticulosis   . Fibromyalgia   . GERD (gastroesophageal reflux disease)   . Gout 02/2018   L hand  . Hypothyroidism   . Pelvis fracture ([place])   . Peritoneal free air 03/29/2012  . Pneumonia   . Pneumoperitoneum 02/26/2017  . Thyroid disorder   . Toe pain 09/06/2016    Assessment/Plan: 2 Days Post-Op Procedure(s) (LRB): INTRAMEDULLARY (IM) NAIL FEMORAL (Left) Principal Problem:   Displaced intertrochanteric fracture of left femur, initial encounter for closed fracture [facility]) Active Problems:   Fall   Hypothyroidism   S/P small bowel resection   Insomnia secondary to anxiety   GERD (gastroesophageal reflux disease)   Neuropathy   Osteoporosis  Estimated body mass index is 17.97 kg/m as calculated from the following:   Height as of this encounter: 5' (1.524 m).   Weight as of this encounter: 41.7 kg. Advance diet Up with therapy  DVT Prophylaxis - Aspirin Partial Weight bearing LLE D/C O2 and pulse ox and try on room air.  Plan is to go Skilled nursing facility after hospital stay. Hemoglobin of 7.0 today, down from 12.6 pre-operatively. This is likely acute blood loss anemia secondary to surgery on Sunday, and she is currently receiving a unit of blood. We will plan for aquacel dressings to remain in place until follow up. She may shower with these in place. Continue  aspirin 81 mg BID x4 weeks. She will remain partial weight bearing until follow up with Dr. Alvan Dame. Follow up in the office in 2 weeks. She will call our office with any questions or concerns.   We appreciate the assistance of the medical team in managing her care.   Griffith Citron, PA-C Orthopedic Surgery [phone] 03/05/2020, 3:09 PM

## 2020-03-05 NOTE — Discharge Summary (Signed)
Physician Discharge Summary  REIGN CHO [id number] DOB: [birth year] DOA: 03/02/2020  PCP: Sara Davila, No Pcp Per  Admit date: 03/02/2020 Discharge date: 03/05/2020  Admitted From: Home Disposition:  Home  Discharge Condition:Stable CODE STATUS: DNR Diet recommendation: Heart Healthy   Brief/Interim Summary: Sara Davila is a 86 year old female with history of hypothyroidism, osteoporosis who presented with left hip pain.  She sustained a mechanical fall when she was working in her yard.  On presentation, she was hemodynamically stable.  X-ray showed comminuted displaced intertrochanteric fracture on the left.  She underwent intramedullary nailing of left femur on 03/03/2020.  PT/OT recommended skilled nursing facility.  Following problems were addressed during hospitalization:   Left hip fracture: Underwent left-sided intramedullary nailing of the femur.  Continue pain management, DVT prophylaxis with aspirin twice a day.  PT/OT consulted.  She is from independent living facility.  She needs skilled nursing facility on discharge. Continue bowel regimen.  Acute blood loss anemia: Hemoglobin dropped to 7.  Likely associated  with surgery.  We will transfuse her with one unit of  PRBC.Check CBC in a week.  Started on iron supplementation.  Hypothyroidism: Continue Synthyroid  GERD: Continue PPI  Neuropathy: Continue physical therapy.  Continue supportive care.  Discharge Diagnoses:  Principal Problem:   Displaced intertrochanteric fracture of left femur, initial encounter for closed fracture [facility]) Active Problems:   Fall   Hypothyroidism   S/P small bowel resection   Insomnia secondary to anxiety   GERD (gastroesophageal reflux disease)   Neuropathy   Osteoporosis    Discharge Instructions  Discharge Instructions    Diet - low sodium heart healthy   Complete by: As directed    Discharge instructions   Complete by: As directed    1)Take prescribed medications as  instructed. 2)Do a CBC test in a week. 3)Follow up with orthopedics in 2 weeks.  Name and number the provider has been attached.   Increase activity slowly   Complete by: As directed      Allergies as of 03/05/2020      Reactions   Codeine Rash   Penicillins Rash   Has Sara Davila had a PCN reaction causing immediate rash, facial/tongue/throat swelling, SOB or lightheadedness with hypotension: No Has Sara Davila had a PCN reaction causing severe rash involving mucus membranes or skin necrosis: No Has Sara Davila had a PCN reaction that required hospitalization: No Has Sara Davila had a PCN reaction occurring within the last 10 years: No If all of the above answers are "NO", then may proceed with Cephalosporin use.      Medication List    STOP taking these medications   traMADol 50 MG tablet Commonly known as: ULTRAM     TAKE these medications   aspirin 81 MG EC tablet Take 1 tablet (81 mg total) by mouth 2 (two) times daily.   CALCIUM 500/D PO Take 500 mg by mouth daily.   ferrous sulfate 325 (65 FE) MG tablet Take 1 tablet (325 mg total) by mouth 2 (two) times daily with a meal.   HAIR SKIN NAILS PO Take by mouth daily.   hydrochlorothiazide 12.5 MG capsule Commonly known as: MICROZIDE Take 12.5 mg by mouth 2 (two) times a week. Monday & Friday.   HYDROcodone-acetaminophen 7.5-325 MG tablet Commonly known as: NORCO Take 1-2 tablets by mouth every 4 (four) hours as needed for severe pain (pain score 7-10).   ibandronate 150 MG tablet Commonly known as: BONIVA Take 150 mg by mouth every 30 (thirty) days. Take  in the morning with a full glass of water, on an empty stomach, and do not take anything else by mouth or lie down for the next 30 min.   levothyroxine 75 MCG tablet Commonly known as: SYNTHROID Take 75 mcg by mouth daily before breakfast.   LORazepam 0.5 MG tablet Commonly known as: ATIVAN Take 0.25 mg by mouth at bedtime as needed for anxiety or sleep.   melatonin 5 MG  Tabs Take 5 mg by mouth at bedtime.   omeprazole 20 MG capsule Commonly known as: PRILOSEC Take 20 mg by mouth daily as needed.   polyethylene glycol 17 g packet Commonly known as: MIRALAX / GLYCOLAX Take 17 g by mouth daily as needed. What changed: Another medication with the same name was added. Make sure you understand how and when to take each.   polyethylene glycol 17 g packet Commonly known as: MIRALAX / GLYCOLAX Take 17 g by mouth daily. What changed: You were already taking a medication with the same name, and this prescription was added. Make sure you understand how and when to take each.   PREVAGEN PO Take by mouth daily.   PROBIOTIC PO Take by mouth. Dose unknown   senna-docusate 8.6-50 MG tablet Commonly known as: Senokot-S Take 2 tablets by mouth 2 (two) times daily.   THERATEARS OP Place 1 drop into both eyes 2 (two) times daily.   TYLENOL EXTRA STRENGTH PO Take by mouth as needed.   Vitamin D (Cholecalciferol) 50 MCG (2000 UT) Caps Take 1 tablet by mouth daily.   zolpidem 10 MG tablet Commonly known as: AMBIEN Take 5 mg by mouth at bedtime as needed for sleep.       Contact information for follow-up providers    Paralee Cancel, MD. Schedule an appointment as soon as possible for a visit in 2 weeks.   Specialty: Orthopedic Surgery Contact information: [address] [place] [address] [phone]            Contact information for after-discharge care    Destination    [facility] .   Service: Skilled Nursing Contact information: [place] [phone]                 Allergies  Allergen Reactions  . Codeine Rash  . Penicillins Rash    Has Sara Davila had a PCN reaction causing immediate rash, facial/tongue/throat swelling, SOB or lightheadedness with hypotension: No Has Sara Davila had a PCN reaction causing severe rash involving mucus membranes or skin  necrosis: No Has Sara Davila had a PCN reaction that required hospitalization: No Has Sara Davila had a PCN reaction occurring within the last 10 years: No If all of the above answers are "NO", then may proceed with Cephalosporin use.     Consultations:  Orthopedics   Procedures/Studies: CT HEAD WO CONTRAST  Result Date: 03/02/2020 CLINICAL DATA:  Head trauma, struck in head by a potted plant causing Sara Davila to fall to ground EXAM: CT HEAD WITHOUT CONTRAST TECHNIQUE: Contiguous axial images were obtained from the base of the skull through the vertex without intravenous contrast. Sagittal and coronal MPR images reconstructed from axial data set. COMPARISON:  03/25/2012 FINDINGS: Brain: Generalized atrophy. Normal ventricular morphology. No midline shift or mass effect. Small vessel chronic ischemic changes of deep cerebral white matter. No intracranial hemorrhage, mass lesion, evidence of acute infarction, or extra-axial fluid collection. Vascular: No hyperdense vessels Skull: Intact Sinuses/Orbits: Clear Other: N/A IMPRESSION: Atrophy with  minimal small vessel chronic ischemic changes of deep cerebral white matter. No acute intracranial abnormalities. Electronically Signed   By: Lavonia Dana M.D.   On: 03/02/2020 23:22   DG Chest Port 1 View  Result Date: 03/02/2020 CLINICAL DATA:  LEFT hip pain post fall EXAM: PORTABLE CHEST 1 VIEW COMPARISON:  Portable exam 2317 hours compared to 12/01/2016 FINDINGS: Normal heart size, mediastinal contours, and pulmonary vascularity. Mild scarring at RIGHT base. Lungs otherwise clear. Calcified granuloma RIGHT mid lung. No infiltrate, pleural effusion, or pneumothorax. Bones demineralized. IMPRESSION: No acute abnormalities. Mild scarring RIGHT base. Electronically Signed   By: Lavonia Dana M.D.   On: 03/02/2020 23:24   DG C-Arm 1-60 Min-No Report  Result Date: 03/03/2020 Fluoroscopy was utilized by the requesting physician.  No radiographic interpretation.   DG HIP  OPERATIVE UNILAT W OR W/O PELVIS LEFT  Result Date: 03/03/2020 CLINICAL DATA:  Left hip intramedullary nail EXAM: OPERATIVE LEFT HIP (WITH PELVIS IF PERFORMED) 2 VIEWS TECHNIQUE: Fluoroscopic spot image(s) were submitted for interpretation post-operatively. COMPARISON:  None. FINDINGS: Intraoperative fluoroscopic views of the left hip demonstrating intramedullary nail fixation are submitted for review. IMPRESSION: Intraoperative fluoroscopic views of the left hip demonstrating intramedullary nail fixation are submitted for review. Electronically Signed   By: Eddie Candle M.D.   On: 03/03/2020 14:04   DG Hip Unilat W or Wo Pelvis 2-3 Views Left  Result Date: 03/02/2020 CLINICAL DATA:  Struck in the head by a potted plant, fell to ground, LEFT hip pain EXAM: DG HIP (WITH OR WITHOUT PELVIS) 2-3V LEFT COMPARISON:  Pelvic radiograph 03/25/2012 FINDINGS: Osseous demineralization. Prior lumbosacral fusion. Narrowing of hip joints bilaterally. SI joints preserved. Comminuted mildly displaced intertrochanteric fracture LEFT femur. No dislocation. Pelvis intact. IMPRESSION: Comminuted displaced intertrochanteric fracture LEFT femur. Electronically Signed   By: Lavonia Dana M.D.   On: 03/02/2020 23:23       Subjective: Sara Davila seen and examined at the bedside this morning.  Hemodynamically stable for discharge today.  Discharge Exam: Vitals:   03/04/20 2128 03/05/20 0653  BP: 114/62 (!) 148/69  Pulse: 90 79  Resp: 16 16  Temp: 98.2 F (36.8 C) 97.9 F (36.6 C)  SpO2: 97% 95%   Vitals:   03/04/20 1049 03/04/20 1414 03/04/20 2128 03/05/20 0653  BP: (!) 109/48 (!) 98/53 114/62 (!) 148/69  Pulse: 98 88 90 79  Resp: 20 18 16 16   Temp: (!) 97.4 F (36.3 C) 97.9 F (36.6 C) 98.2 F (36.8 C) 97.9 F (36.6 C)  TempSrc:  Oral Oral Oral  SpO2: 99% 98% 97% 95%  Weight:      Height:        General: Pt is alert, awake, not in acute distress Cardiovascular: RRR, S1/S2 +, no rubs, no  gallops Respiratory: CTA bilaterally, no wheezing, no rhonchi Abdominal: Soft, NT, ND, bowel sounds + Extremities: no edema, no cyanosis, clean surgical wound on the left hip    The results of significant diagnostics from this hospitalization (including imaging, microbiology, ancillary and laboratory) are listed below for reference.     Microbiology: Recent Results (from the past 240 hour(s))  Respiratory Panel by RT PCR (Flu A&B, Covid) - Nasopharyngeal Swab     Status: None   Collection Time: 03/03/20  3:06 AM   Specimen: Nasopharyngeal Swab  Result Value Ref Range Status   SARS Coronavirus 2 by RT PCR NEGATIVE NEGATIVE Final    Comment: (NOTE) SARS-CoV-2 target nucleic acids are NOT DETECTED. The SARS-CoV-2 RNA  is generally detectable in upper respiratoy specimens during the acute phase of infection. The lowest concentration of SARS-CoV-2 viral copies this assay can detect is 131 copies/mL. A negative result does not preclude SARS-Cov-2 infection and should not be used as the sole basis for treatment or other Sara Davila management decisions. A negative result may occur with  improper specimen collection/handling, submission of specimen other than nasopharyngeal swab, presence of viral mutation(s) within the areas targeted by this assay, and inadequate number of viral copies (<131 copies/mL). A negative result must be combined with clinical observations, Sara Davila history, and epidemiological information. The expected result is Negative. Fact Sheet for Patients:  PinkCheek.be Fact Sheet for Healthcare Providers:  GravelBags.it This test is not yet ap proved or cleared by the [place] FDA and  has been authorized for detection and/or diagnosis of SARS-CoV-2 by FDA under an Emergency Use Authorization (EUA). This EUA will remain  in effect (meaning this test can be used) for the duration of the COVID-19 declaration under  Section 564(b)(1) of the Act, 21 U.S.C. section 360bbb-3(b)(1), unless the authorization is terminated or revoked sooner.    Influenza A by PCR NEGATIVE NEGATIVE Final   Influenza B by PCR NEGATIVE NEGATIVE Final    Comment: (NOTE) The Xpert Xpress SARS-CoV-2/FLU/RSV assay is intended as an aid in  the diagnosis of influenza from Nasopharyngeal swab specimens and  should not be used as a sole basis for treatment. Nasal washings and  aspirates are unacceptable for Xpert Xpress SARS-CoV-2/FLU/RSV  testing. Fact Sheet for Patients: PinkCheek.be Fact Sheet for Healthcare Providers: GravelBags.it This test is not yet approved or cleared by the [place] FDA and  has been authorized for detection and/or diagnosis of SARS-CoV-2 by  FDA under an Emergency Use Authorization (EUA). This EUA will remain  in effect (meaning this test can be used) for the duration of the  Covid-19 declaration under Section 564(b)(1) of the Act, 21  U.S.C. section 360bbb-3(b)(1), unless the authorization is  terminated or revoked. Performed at [facility], [place] [address] [place], [place] [address]   MRSA PCR Screening     Status: None   Collection Time: 03/03/20  7:24 AM   Specimen: Nasal Mucosa; Nasopharyngeal  Result Value Ref Range Status   MRSA by PCR NEGATIVE NEGATIVE Final    Comment:        The GeneXpert MRSA Assay (FDA approved for NASAL specimens only), is one component of a comprehensive MRSA colonization surveillance program. It is not intended to diagnose MRSA infection nor to guide or monitor treatment for MRSA infections. Performed at [facility], [place] [address], [place], [place] [address]      Labs: BNP (last 3 results) No results for input(s): BNP in the last 8760 hours. Basic Metabolic Panel: Recent Labs  Lab 03/02/20 2255 03/04/20 0452 03/05/20 0437  NA 135 133* 137  K 4.4  4.6 4.6  CL 97* 105 109  CO2 26 22 24   GLUCOSE 162* 124* 102*  BUN 20 12 12   CREATININE 0.80 0.58 0.61  CALCIUM 9.9 8.2* 8.0*   Liver Function Tests: No results for input(s): AST, ALT, ALKPHOS, BILITOT, PROT, ALBUMIN in the last 168 hours. No results for input(s): LIPASE, AMYLASE in the last 168 hours. No results for input(s): AMMONIA in the last 168 hours. CBC: Recent Labs  Lab 03/02/20 2255 03/04/20 0452 03/05/20 0437  WBC 15.6* 8.4 7.2  NEUTROABS 13.4*  --  3.9  HGB 12.6 7.7* 7.0*  HCT 37.7 23.2* 21.5*  MCV 98.2 100.9* 100.9*  PLT 324 226 220   Cardiac Enzymes: No results for input(s): CKTOTAL, CKMB, CKMBINDEX, TROPONINI in the last 168 hours. BNP: Invalid input(s): POCBNP CBG: No results for input(s): GLUCAP in the last 168 hours. D-Dimer No results for input(s): DDIMER in the last 72 hours. Hgb A1c No results for input(s): HGBA1C in the last 72 hours. Lipid Profile No results for input(s): CHOL, HDL, LDLCALC, TRIG, CHOLHDL, LDLDIRECT in the last 72 hours. Thyroid function studies No results for input(s): TSH, T4TOTAL, T3FREE, THYROIDAB in the last 72 hours.  Invalid input(s): FREET3 Anemia work up No results for input(s): VITAMINB12, FOLATE, FERRITIN, TIBC, IRON, RETICCTPCT in the last 72 hours. Urinalysis    Component Value Date/Time   COLORURINE STRAW (A) 02/26/2017 1450   APPEARANCEUR CLEAR 02/26/2017 1450   LABSPEC 1.020 02/26/2017 1450   PHURINE 6.0 02/26/2017 1450   GLUCOSEU NEGATIVE 02/26/2017 1450   HGBUR NEGATIVE 02/26/2017 1450   BILIRUBINUR NEGATIVE 02/26/2017 1450   KETONESUR NEGATIVE 02/26/2017 1450   PROTEINUR NEGATIVE 02/26/2017 1450   NITRITE NEGATIVE 02/26/2017 1450   LEUKOCYTESUR SMALL (A) 02/26/2017 1450   Sepsis Labs Invalid input(s): PROCALCITONIN,  WBC,  LACTICIDVEN Microbiology Recent Results (from the past 240 hour(s))  Respiratory Panel by RT PCR (Flu A&B, Covid) - Nasopharyngeal Swab     Status: None   Collection Time:  03/03/20  3:06 AM   Specimen: Nasopharyngeal Swab  Result Value Ref Range Status   SARS Coronavirus 2 by RT PCR NEGATIVE NEGATIVE Final    Comment: (NOTE) SARS-CoV-2 target nucleic acids are NOT DETECTED. The SARS-CoV-2 RNA is generally detectable in upper respiratoy specimens during the acute phase of infection. The lowest concentration of SARS-CoV-2 viral copies this assay can detect is 131 copies/mL. A negative result does not preclude SARS-Cov-2 infection and should not be used as the sole basis for treatment or other Sara Davila management decisions. A negative result may occur with  improper specimen collection/handling, submission of specimen other than nasopharyngeal swab, presence of viral mutation(s) within the areas targeted by this assay, and inadequate number of viral copies (<131 copies/mL). A negative result must be combined with clinical observations, Sara Davila history, and epidemiological information. The expected result is Negative. Fact Sheet for Patients:  PinkCheek.be Fact Sheet for Healthcare Providers:  GravelBags.it This test is not yet ap proved or cleared by the [place] FDA and  has been authorized for detection and/or diagnosis of SARS-CoV-2 by FDA under an Emergency Use Authorization (EUA). This EUA will remain  in effect (meaning this test can be used) for the duration of the COVID-19 declaration under Section 564(b)(1) of the Act, 21 U.S.C. section 360bbb-3(b)(1), unless the authorization is terminated or revoked sooner.    Influenza A by PCR NEGATIVE NEGATIVE Final   Influenza B by PCR NEGATIVE NEGATIVE Final    Comment: (NOTE) The Xpert Xpress SARS-CoV-2/FLU/RSV assay is intended as an aid in  the diagnosis of influenza from Nasopharyngeal swab specimens and  should not be used as a sole basis for treatment. Nasal washings and  aspirates are unacceptable for Xpert Xpress SARS-CoV-2/FLU/RSV   testing. Fact Sheet for Patients: PinkCheek.be Fact Sheet for Healthcare Providers: GravelBags.it This test is not yet approved or cleared by the [place] FDA and  has been authorized for detection and/or diagnosis of SARS-CoV-2 by  FDA under an Emergency Use Authorization (EUA). This EUA will remain  in effect (meaning this test can be used) for  the duration of the  Covid-19 declaration under Section 564(b)(1) of the Act, 21  U.S.C. section 360bbb-3(b)(1), unless the authorization is  terminated or revoked. Performed at [facility], [place] [address], [place], [place] [address]   MRSA PCR Screening     Status: None   Collection Time: 03/03/20  7:24 AM   Specimen: Nasal Mucosa; Nasopharyngeal  Result Value Ref Range Status   MRSA by PCR NEGATIVE NEGATIVE Final    Comment:        The GeneXpert MRSA Assay (FDA approved for NASAL specimens only), is one component of a comprehensive MRSA colonization surveillance program. It is not intended to diagnose MRSA infection nor to guide or monitor treatment for MRSA infections. Performed at [facility], [place] [address], [place], [place] [address]     Please note: You were cared for by a hospitalist during your hospital stay. Once you are discharged, your primary care physician will handle any further medical issues. Please note that NO REFILLS for any discharge medications will be authorized once you are discharged, as it is imperative that you return to your primary care physician (or establish a relationship with a primary care physician if you do not have one) for your post hospital discharge needs so that they can reassess your need for medications and monitor your lab values.    Time coordinating discharge: 40 minutes  SIGNED:   Shelly Coss, MD  [facility] 03/05/2020, 10:10 AM Pager [phone]  If 7PM-7AM, please  contact night-coverage [email or web address] Password [email or web address]

## 2020-03-06 ENCOUNTER — Non-Acute Institutional Stay (SKILLED_NURSING_FACILITY): Payer: Medicare Other | Admitting: Nurse Practitioner

## 2020-03-06 ENCOUNTER — Other Ambulatory Visit: Payer: Self-pay

## 2020-03-06 ENCOUNTER — Encounter: Payer: Self-pay | Admitting: Nurse Practitioner

## 2020-03-06 DIAGNOSIS — F5105 Insomnia due to other mental disorder: Secondary | ICD-10-CM

## 2020-03-06 DIAGNOSIS — F419 Anxiety disorder, unspecified: Secondary | ICD-10-CM

## 2020-03-06 DIAGNOSIS — I872 Venous insufficiency (chronic) (peripheral): Secondary | ICD-10-CM | POA: Diagnosis not present

## 2020-03-06 DIAGNOSIS — K219 Gastro-esophageal reflux disease without esophagitis: Secondary | ICD-10-CM | POA: Diagnosis not present

## 2020-03-06 DIAGNOSIS — D500 Iron deficiency anemia secondary to blood loss (chronic): Secondary | ICD-10-CM | POA: Insufficient documentation

## 2020-03-06 DIAGNOSIS — E032 Hypothyroidism due to medicaments and other exogenous substances: Secondary | ICD-10-CM | POA: Diagnosis not present

## 2020-03-06 DIAGNOSIS — S72142A Displaced intertrochanteric fracture of left femur, initial encounter for closed fracture: Secondary | ICD-10-CM

## 2020-03-06 DIAGNOSIS — R339 Retention of urine, unspecified: Secondary | ICD-10-CM | POA: Diagnosis not present

## 2020-03-06 LAB — BPAM RBC
Blood Product Expiration Date: 202104232359
ISSUE DATE / TIME: 202104061122
Unit Type and Rh: 600

## 2020-03-06 LAB — TYPE AND SCREEN
ABO/RH(D): A NEG
Antibody Screen: NEGATIVE
Unit division: 0

## 2020-03-06 MED ORDER — HYDROCODONE-ACETAMINOPHEN 7.5-325 MG PO TABS: 1.0000 | ORAL_TABLET | ORAL | 0 refills | Status: DC | PRN

## 2020-03-06 MED ORDER — LORAZEPAM 0.5 MG PO TABS: 0.2500 mg | ORAL_TABLET | Freq: Every evening | ORAL | 0 refills | Status: DC | PRN

## 2020-03-06 NOTE — Telephone Encounter (Signed)
[facility] sent fax for refill on Medication " Hydrocodone ". Medication pend and sent to provider.  Please Advise.

## 2020-03-06 NOTE — Assessment & Plan Note (Signed)
Stable, continue Omeprazole.  

## 2020-03-06 NOTE — Assessment & Plan Note (Addendum)
Hgb 7s post op, decrease Fe qd/bid is too constipating, per DC summary-may transfuse in one week,  repeat CBC/diff

## 2020-03-06 NOTE — Progress Notes (Addendum)
Location:   [facility] Room Number: 42 Place of Service:  SNF (31) Provider:  Marda Stalker, Lennie Odor NP  [name], MD  Patient Care Team: [name], MD as PCP - General (Internal Medicine)  Extended Emergency Contact Information Primary Emergency Contact: Katherine Mantle of [place] Phone: [phone] Relation: Daughter Secondary Emergency Contact: Milon Dikes [place] of [place] Phone: [phone] Mobile Phone: [phone] Relation: Friend  Code Status:   Goals of care: Advanced Directive information Advanced Directives 03/06/2020  Does Patient Have a Medical Advance Directive? Yes  Type of Advance Directive Living will;Out of facility DNR (pink MOST or yellow form)  Does patient want to make changes to medical advance directive? No - Patient declined  Copy of [place] in Chart? -  Would patient like information on creating a medical advance directive? -  Pre-existing out of facility DNR order (yellow form or pink MOST form) Pink MOST form placed in chart (order not valid for inpatient use)     Chief Complaint  Patient presents with  . Acute Visit    Urinary retention    HPI:  Pt is a 86 y.o. female seen today for an acute visit for no voiding >8hrs last night, I+O relieved discomfort. Constipation, on Senokot S, MiraLax qd and prn. Post Op anemia, Hgb 7 03/05/20, on Fe bid. Edema BLE L>R, on HCTZ 12.55m 2x/wk. S/p L hip IM nail 03/03/20 for left hip fx, hospitalized 03/02/20-03/05/20 , prn Tylenol, Norco. Hypothyroidism, on Levothyroxine 755m qd. Anxiety, stable prn Lorazepam 0.2570ms, prn Ambien. GERD, stable, on Omeprazole 250m53mn   Past Medical History:  Diagnosis Date  . Abdominal pain 02/26/2017  . Anxiety   . Arthritis    Left Knee, Wrist, Back and Hips  . Cervical vertebral fusion   . Diverticulitis   . Diverticulosis   . Fibromyalgia   . GERD (gastroesophageal reflux disease)   .  Gout 02/2018   L hand  . Hypothyroidism   . Pelvis fracture (HCC)[place]. Peritoneal free air 03/29/2012  . Pneumonia   . Pneumoperitoneum 02/26/2017  . Thyroid disorder   . Toe pain 09/06/2016   Past Surgical History:  Procedure Laterality Date  . ABDOMINAL HYSTERECTOMY    . BOWEL RESECTION N/A 07/09/2017   Procedure: SMALL BOWEL RESECTION;  Surgeon: MartJohnathan Hausen;  Location: [facility];  Service: General;  Laterality: N/A;  . CERVICAL FUSION     x2  . FEMUR IM NAIL Left 03/03/2020   Procedure: INTRAMEDULLARY (IM) NAIL FEMORAL;  Surgeon: OlinParalee Cancel;  Location: [facility];  Service: Orthopedics;  Laterality: Left;  . LAPAROSCOPIC APPENDECTOMY N/A 03/04/2017   Procedure: APPENDECTOMY LAPAROSCOPIC;  Surgeon: MattJohnathan Hausen;  Location: [facility];  Service: General;  Laterality: N/A;  . LAPAROSCOPY N/A 03/04/2017   Procedure: LAPAROSCOPY DIAGNOSTIC, ENTEROLYSIS;  Surgeon: MattJohnathan Hausen;  Location: [facility];  Service: General;  Laterality: N/A;  . LAPAROTOMY N/A 07/09/2017   Procedure: EXPLORATORY LAPAROTOMY;  Surgeon: MartJohnathan Hausen;  Location: [facility];  Service: General;  Laterality: N/A;  . SPINE SURGERY     Lumbar- rod placement  . TONSILLECTOMY     86y/o  . TOTAL VAGINAL HYSTERECTOMY      Allergies  Allergen Reactions  . Codeine Rash  . Penicillins Rash    Has patient had a PCN reaction causing immediate rash, facial/tongue/throat swelling, SOB or lightheadedness with hypotension: No Has patient had a  PCN reaction causing severe rash involving mucus membranes or skin necrosis: No Has patient had a PCN reaction that required hospitalization: No Has patient had a PCN reaction occurring within the last 10 years: No If all of the above answers are "NO", then may proceed with Cephalosporin use.     Allergies as of 03/06/2020      Reactions   Codeine Rash   Penicillins Rash   Has patient had a PCN reaction causing immediate rash, facial/tongue/throat swelling, SOB or  lightheadedness with hypotension: No Has patient had a PCN reaction causing severe rash involving mucus membranes or skin necrosis: No Has patient had a PCN reaction that required hospitalization: No Has patient had a PCN reaction occurring within the last 10 years: No If all of the above answers are "NO", then may proceed with Cephalosporin use.      Medication List       Accurate as of March 06, 2020  1:49 PM. If you have any questions, ask your nurse or doctor.        aspirin 81 MG EC tablet Take 1 tablet (81 mg total) by mouth 2 (two) times daily.   CALCIUM 500/D PO Take 500 mg by mouth daily.   ferrous sulfate 325 (65 FE) MG tablet Take 1 tablet (325 mg total) by mouth 2 (two) times daily with a meal.   HAIR SKIN NAILS PO Take by mouth daily.   hydrochlorothiazide 12.5 MG capsule Commonly known as: MICROZIDE Take 12.5 mg by mouth 2 (two) times a week. Monday & Friday.   HYDROcodone-acetaminophen 7.5-325 MG tablet Commonly known as: NORCO Take 1-2 tablets by mouth every 4 (four) hours as needed for severe pain (pain score 7-10).   ibandronate 150 MG tablet Commonly known as: BONIVA Take 150 mg by mouth every 30 (thirty) days. Take in the morning with a full glass of water, on an empty stomach, and do not take anything else by mouth or lie down for the next 30 min.   levothyroxine 75 MCG tablet Commonly known as: SYNTHROID Take 75 mcg by mouth daily before breakfast.   LORazepam 0.5 MG tablet Commonly known as: ATIVAN Take 0.5 tablets (0.25 mg total) by mouth at bedtime as needed for anxiety or sleep.   melatonin 5 MG Tabs Take 5 mg by mouth at bedtime.   omeprazole 20 MG capsule Commonly known as: PRILOSEC Take 20 mg by mouth daily as needed.   polyethylene glycol 17 g packet Commonly known as: MIRALAX / GLYCOLAX Take 17 g by mouth daily as needed.   polyethylene glycol 17 g packet Commonly known as: MIRALAX / GLYCOLAX Take 17 g by mouth daily.     PREVAGEN PO Take by mouth daily.   PROBIOTIC PO Take by mouth. Dose unknown   senna-docusate 8.6-50 MG tablet Commonly known as: Senokot-S Take 2 tablets by mouth 2 (two) times daily.   THERATEARS OP Place 1 drop into both eyes 2 (two) times daily.   TYLENOL EXTRA STRENGTH PO Take by mouth as needed.   Vitamin D (Cholecalciferol) 50 MCG (2000 UT) Caps Take 1 tablet by mouth daily.   zolpidem 10 MG tablet Commonly known as: AMBIEN Take 5 mg by mouth at bedtime as needed for sleep.       Review of Systems  Constitutional: Negative for activity change, appetite change, fatigue, fever and unexpected weight change.  HENT: Negative for congestion and voice change.   Eyes: Negative for visual disturbance.  Respiratory: Negative  for cough, shortness of breath and wheezing.   Cardiovascular: Positive for leg swelling. Negative for chest pain and palpitations.  Gastrointestinal: Positive for constipation. Negative for abdominal distention, abdominal pain, diarrhea, nausea and vomiting.  Genitourinary: Positive for difficulty urinating. Negative for dysuria, frequency, hematuria and urgency.       I+O cath x1  Musculoskeletal: Positive for arthralgias, back pain and gait problem.  Skin: Positive for pallor. Negative for color change.  Neurological: Negative for dizziness, speech difficulty, weakness and headaches.  Psychiatric/Behavioral: Positive for sleep disturbance. Negative for agitation, behavioral problems and hallucinations. The patient is not nervous/anxious.     Immunization History  Administered Date(s) Administered  . Influenza-Unspecified 09/01/2018  . Moderna SARS-COVID-2 Vaccination 12/04/2019, 01/01/2020  . Zoster Recombinat (Shingrix) 07/05/2018   Pertinent  Health Maintenance Due  Topic Date Due  . DEXA SCAN  Never done  . PNA vac Low Risk Adult (1 of 2 - PCV13) Never done  . INFLUENZA VACCINE  06/30/2020   No flowsheet data found. Functional Status  Survey:    Vitals:   03/06/20 1020  BP: 116/70  Pulse: 84  Resp: 20  Temp: 98.9 F (37.2 C)  SpO2: 96%  Weight: 90 lb 6.4 oz (41 kg)  Height: 5' (1.524 m)   Body mass index is 17.66 kg/m. Physical Exam Constitutional:      General: She is not in acute distress.    Appearance: Normal appearance. She is not ill-appearing, toxic-appearing or diaphoretic.  HENT:     Head: Normocephalic and atraumatic.     Nose: Nose normal.     Mouth/Throat:     Mouth: Mucous membranes are moist.  Eyes:     Extraocular Movements: Extraocular movements intact.     Conjunctiva/sclera: Conjunctivae normal.     Pupils: Pupils are equal, round, and reactive to light.  Cardiovascular:     Rate and Rhythm: Normal rate and regular rhythm.     Heart sounds: No murmur.     Comments: Weak left DP pulse, more symptomatic tingling sensation at night.  Pulmonary:     Effort: Pulmonary effort is normal.     Breath sounds: Rales present. No wheezing or rhonchi.     Comments: Right base Abdominal:     General: Bowel sounds are normal. There is no distension.     Palpations: Abdomen is soft.     Tenderness: There is no abdominal tenderness. There is no right CVA tenderness, left CVA tenderness, guarding or rebound.  Musculoskeletal:     Cervical back: Normal range of motion and neck supple.     Right lower leg: No edema.     Left lower leg: Edema present.     Comments: Trace edema LLE, mild scoliosis.   Skin:    General: Skin is warm and dry.     Coloration: Skin is pale.     Comments: S/p L hip IM nail.   Neurological:     General: No focal deficit present.     Mental Status: She is alert and oriented to person, place, and time. Mental status is at baseline.     Motor: No weakness.     Coordination: Coordination normal.     Gait: Gait abnormal.     Comments: Using walker sometimes.   Psychiatric:        Mood and Affect: Mood normal.        Behavior: Behavior normal.        Thought Content:  Thought content normal.  Judgment: Judgment normal.     Labs reviewed: Recent Labs    03/02/20 2255 03/04/20 0452 03/05/20 0437  NA 135 133* 137  K 4.4 4.6 4.6  CL 97* 105 109  CO2 '26 22 24  ' GLUCOSE 162* 124* 102*  BUN '20 12 12  ' CREATININE 0.80 0.58 0.61  CALCIUM 9.9 8.2* 8.0*   No results for input(s): AST, ALT, ALKPHOS, BILITOT, PROT, ALBUMIN in the last 8760 hours. Recent Labs    03/02/20 2255 03/04/20 0452 03/05/20 0437  WBC 15.6* 8.4 7.2  NEUTROABS 13.4*  --  3.9  HGB 12.6 7.7* 7.0*  HCT 37.7 23.2* 21.5*  MCV 98.2 100.9* 100.9*  PLT 324 226 220   No results found for: TSH Lab Results  Component Value Date   HGBA1C 5.9 (H) 03/30/2018   No results found for: CHOL, HDL, LDLCALC, LDLDIRECT, TRIG, CHOLHDL  Significant Diagnostic Results in last 30 days:  CT HEAD WO CONTRAST  Result Date: 03/02/2020 CLINICAL DATA:  Head trauma, struck in head by a potted plant causing patient to fall to ground EXAM: CT HEAD WITHOUT CONTRAST TECHNIQUE: Contiguous axial images were obtained from the base of the skull through the vertex without intravenous contrast. Sagittal and coronal MPR images reconstructed from axial data set. COMPARISON:  03/25/2012 FINDINGS: Brain: Generalized atrophy. Normal ventricular morphology. No midline shift or mass effect. Small vessel chronic ischemic changes of deep cerebral white matter. No intracranial hemorrhage, mass lesion, evidence of acute infarction, or extra-axial fluid collection. Vascular: No hyperdense vessels Skull: Intact Sinuses/Orbits: Clear Other: N/A IMPRESSION: Atrophy with minimal small vessel chronic ischemic changes of deep cerebral white matter. No acute intracranial abnormalities. Electronically Signed   By: Lavonia Dana M.D.   On: 03/02/2020 23:22   DG Chest Port 1 View  Result Date: 03/02/2020 CLINICAL DATA:  LEFT hip pain post fall EXAM: PORTABLE CHEST 1 VIEW COMPARISON:  Portable exam 2317 hours compared to 12/01/2016  FINDINGS: Normal heart size, mediastinal contours, and pulmonary vascularity. Mild scarring at RIGHT base. Lungs otherwise clear. Calcified granuloma RIGHT mid lung. No infiltrate, pleural effusion, or pneumothorax. Bones demineralized. IMPRESSION: No acute abnormalities. Mild scarring RIGHT base. Electronically Signed   By: Lavonia Dana M.D.   On: 03/02/2020 23:24   DG C-Arm 1-60 Min-No Report  Result Date: 03/03/2020 Fluoroscopy was utilized by the requesting physician.  No radiographic interpretation.   DG HIP OPERATIVE UNILAT W OR W/O PELVIS LEFT  Result Date: 03/03/2020 CLINICAL DATA:  Left hip intramedullary nail EXAM: OPERATIVE LEFT HIP (WITH PELVIS IF PERFORMED) 2 VIEWS TECHNIQUE: Fluoroscopic spot image(s) were submitted for interpretation post-operatively. COMPARISON:  None. FINDINGS: Intraoperative fluoroscopic views of the left hip demonstrating intramedullary nail fixation are submitted for review. IMPRESSION: Intraoperative fluoroscopic views of the left hip demonstrating intramedullary nail fixation are submitted for review. Electronically Signed   By: Eddie Candle M.D.   On: 03/03/2020 14:04   DG Hip Unilat W or Wo Pelvis 2-3 Views Left  Result Date: 03/02/2020 CLINICAL DATA:  Struck in the head by a potted plant, fell to ground, LEFT hip pain EXAM: DG HIP (WITH OR WITHOUT PELVIS) 2-3V LEFT COMPARISON:  Pelvic radiograph 03/25/2012 FINDINGS: Osseous demineralization. Prior lumbosacral fusion. Narrowing of hip joints bilaterally. SI joints preserved. Comminuted mildly displaced intertrochanteric fracture LEFT femur. No dislocation. Pelvis intact. IMPRESSION: Comminuted displaced intertrochanteric fracture LEFT femur. Electronically Signed   By: Lavonia Dana M.D.   On: 03/02/2020 23:23    Assessment/Plan Urinary retention No voiding >8  hours last night, I+O cath  GERD (gastroesophageal reflux disease) Stable, continue Omeprazole.   Venous insufficiency Edema, HCTZ 2x/wk, LLE trace  edema, s/p  L hip surgery, update CMP/eGFR, hx of hyponatremia.   Hypothyroidism Stable, continue Levothyroxine, update TSH   Insomnia secondary to anxiety Continue prn Lorazepam, Ambien.   Displaced intertrochanteric fracture of left femur, initial encounter for closed fracture (HCC) S/p IM nail, continue therapy, continue prn Tylenol, prn Noroco  Blood loss anemia Hgb 7s post op, decrease Fe qd/bid is too constipating, per DC summary-may transfuse in one week,  repeat CBC/diff    Family/ staff Communication: plan of care reviewed with the patient and charge nurse.   Labs/tests ordered:  CBC/diff, CMP/eGFR, TSH  Time spend 35 minutes.

## 2020-03-06 NOTE — Assessment & Plan Note (Signed)
Continue prn Lorazepam, Ambien.

## 2020-03-06 NOTE — Assessment & Plan Note (Signed)
No voiding >8 hours last night, I+O cath

## 2020-03-06 NOTE — Assessment & Plan Note (Addendum)
Stable, continue Levothyroxine, update TSH

## 2020-03-06 NOTE — Assessment & Plan Note (Signed)
S/p IM nail, continue therapy, continue prn Tylenol, prn Noroco

## 2020-03-06 NOTE — Assessment & Plan Note (Addendum)
Edema, HCTZ 2x/wk, LLE trace edema, s/p  L hip surgery, update CMP/eGFR, hx of hyponatremia.

## 2020-03-06 NOTE — Telephone Encounter (Signed)
[facility] sent fax for refill on Medication "  Hydrocodone", and "Lorazepam" . Medications pend and sent to provider. Please Advise.

## 2020-03-08 LAB — CBC AND DIFFERENTIAL
HCT: 27 — AB (ref 36–46)
Hemoglobin: 9.2 — AB (ref 12.0–16.0)
Neutrophils Absolute: 3819
Platelets: 339 (ref 150–399)
WBC: 6.2

## 2020-03-08 LAB — HEPATIC FUNCTION PANEL
ALT: 6 — AB (ref 7–35)
AST: 19 (ref 13–35)
Alkaline Phosphatase: 43 (ref 25–125)
Bilirubin, Total: 0.8

## 2020-03-08 LAB — TSH: TSH: 2.61 (ref 0.41–5.90)

## 2020-03-08 LAB — BASIC METABOLIC PANEL
BUN: 9 (ref 4–21)
CO2: 27 — AB (ref 13–22)
Chloride: 101 (ref 99–108)
Creatinine: 0.6 (ref 0.5–1.1)
Glucose: 92
Potassium: 4.1 (ref 3.4–5.3)
Sodium: 133 — AB (ref 137–147)

## 2020-03-08 LAB — CBC: RBC: 2.94 — AB (ref 3.87–5.11)

## 2020-03-08 LAB — COMPREHENSIVE METABOLIC PANEL
Albumin: 3.2 — AB (ref 3.5–5.0)
Calcium: 8.1 — AB (ref 8.7–10.7)
Globulin: 2.1

## 2020-03-12 ENCOUNTER — Other Ambulatory Visit: Payer: Self-pay

## 2020-03-12 ENCOUNTER — Non-Acute Institutional Stay (SKILLED_NURSING_FACILITY): Payer: Medicare Other | Admitting: Internal Medicine

## 2020-03-12 ENCOUNTER — Other Ambulatory Visit: Payer: Self-pay | Admitting: Internal Medicine

## 2020-03-12 ENCOUNTER — Encounter: Payer: Self-pay | Admitting: Internal Medicine

## 2020-03-12 DIAGNOSIS — M8000XS Age-related osteoporosis with current pathological fracture, unspecified site, sequela: Secondary | ICD-10-CM | POA: Diagnosis not present

## 2020-03-12 DIAGNOSIS — R300 Dysuria: Secondary | ICD-10-CM | POA: Diagnosis not present

## 2020-03-12 DIAGNOSIS — R509 Fever, unspecified: Secondary | ICD-10-CM

## 2020-03-12 DIAGNOSIS — Z8781 Personal history of (healed) traumatic fracture: Secondary | ICD-10-CM

## 2020-03-12 DIAGNOSIS — R413 Other amnesia: Secondary | ICD-10-CM

## 2020-03-12 DIAGNOSIS — I872 Venous insufficiency (chronic) (peripheral): Secondary | ICD-10-CM

## 2020-03-12 DIAGNOSIS — E032 Hypothyroidism due to medicaments and other exogenous substances: Secondary | ICD-10-CM

## 2020-03-12 DIAGNOSIS — R7303 Prediabetes: Secondary | ICD-10-CM

## 2020-03-12 LAB — CBC AND DIFFERENTIAL
HCT: 31 — AB (ref 36–46)
Hemoglobin: 10.2 — AB (ref 12.0–16.0)
Neutrophils Absolute: 8448
Platelets: 643 — AB (ref 150–399)
WBC: 11

## 2020-03-12 LAB — CBC: RBC: 3.27 — AB (ref 3.87–5.11)

## 2020-03-12 MED ORDER — OXYCODONE HCL 5 MG PO TABS: 5.0000 mg | ORAL_TABLET | Freq: Four times a day (QID) | ORAL | 0 refills | Status: DC | PRN

## 2020-03-12 MED ORDER — LORAZEPAM 0.5 MG PO TABS: 0.2500 mg | ORAL_TABLET | Freq: Two times a day (BID) | ORAL | 0 refills | Status: DC | PRN

## 2020-03-12 NOTE — Progress Notes (Signed)
Provider:  [name], MD Location:   [place] Room Number: 42 Place of Service:  SNF [phone])  PCP: [name], MD Patient Care Team: [name], MD as PCP - General (Internal Medicine)  Extended Emergency Contact Information Primary Emergency Contact: Katherine Mantle of [place] Phone: [phone] Relation: Daughter Secondary Emergency Contact: Milon Dikes [place] of [place] Phone: [phone] Mobile Phone: [phone] Relation: Friend  Code Status: DNR Goals of Care: Advanced Directive information Advanced Directives 03/12/2020  Does Patient Have a Medical Advance Directive? Yes  Type of Advance Directive Living will;Out of facility DNR (pink MOST or yellow form)  Does patient want to make changes to medical advance directive? No - Patient declined  Copy of [place] in Chart? -  Would patient like information on creating a medical advance directive? -  Pre-existing out of facility DNR order (yellow form or pink MOST form) Pink MOST form placed in chart (order not valid for inpatient use)      Chief Complaint  Patient presents with  . New Admit To SNF    Admission  . Acute Visit    Fever    HPI: Patient is a 86 y.o. female seen today for admission to SNF for Therapy  Was in the hospital from 4/3 -4/6 for Left Hip Fracture   She has history of Hypothyroidism, Osteoporosis, Arthritis and GERD  She lives in [place] by herself. Was Very active. [place] while taking care of her Plants Had immediate Pain Was found to have Left Intertrochanteric Fracture Underwent IM nail placement on 04/04  Postop patient did have hemoglobin dropped to 7.  Was started on iron.  And since then her hemoglobin has come up. Patient is doing well with therapy.  She is partial weightbearing.  Her only problem is pain control.  She wanted to know if she can get Tylenol in the morning.  She also wanted some  Ativan to help with anxiety and pain. Yesterday patient also had a low-grade temp of 100.3.  And she is complaining of some dysuria and frequency.  Denies any abdominal pain or chills.  Mild nausea but no vomiting.  Also wanted to know if she can get off iron as it is giving her constipation.   Past Medical History:  Diagnosis Date  . Abdominal pain 02/26/2017  . Anxiety   . Arthritis    Left Knee, Wrist, Back and Hips  . Cervical vertebral fusion   . Diverticulitis   . Diverticulosis   . Fibromyalgia   . GERD (gastroesophageal reflux disease)   . Gout 02/2018   L hand  . Hypothyroidism   . Pelvis fracture ([place])   . Peritoneal free air 03/29/2012  . Pneumonia   . Pneumoperitoneum 02/26/2017  . Thyroid disorder   . Toe pain 09/06/2016   Past Surgical History:  Procedure Laterality Date  . ABDOMINAL HYSTERECTOMY    . BOWEL RESECTION N/A 07/09/2017   Procedure: SMALL BOWEL RESECTION;  Surgeon: Johnathan Hausen, MD;  Location: [facility];  Service: General;  Laterality: N/A;  . CERVICAL FUSION     x2  . FEMUR IM NAIL Left 03/03/2020   Procedure: INTRAMEDULLARY (IM) NAIL FEMORAL;  Surgeon: Paralee Cancel, MD;  Location: [facility];  Service: Orthopedics;  Laterality: Left;  . LAPAROSCOPIC APPENDECTOMY N/A 03/04/2017   Procedure: APPENDECTOMY LAPAROSCOPIC;  Surgeon: Johnathan Hausen, MD;  Location: [facility];  Service: General;  Laterality: N/A;  .  LAPAROSCOPY N/A 03/04/2017   Procedure: LAPAROSCOPY DIAGNOSTIC, ENTEROLYSIS;  Surgeon: Johnathan Hausen, MD;  Location: [facility];  Service: General;  Laterality: N/A;  . LAPAROTOMY N/A 07/09/2017   Procedure: EXPLORATORY LAPAROTOMY;  Surgeon: Johnathan Hausen, MD;  Location: [facility];  Service: General;  Laterality: N/A;  . SPINE SURGERY     Lumbar- rod placement  . TONSILLECTOMY     86y/o  . TOTAL VAGINAL HYSTERECTOMY      reports that she has quit smoking. She has never used smokeless tobacco. She reports current alcohol use. She reports that she does not use  drugs. Social History   Socioeconomic History  . Marital status: Widowed    Spouse name: Not on file  . Number of children: 2  . Years of education: Therapist, sports  . Highest education level: Not on file  Occupational History  . Occupation: Retired   Tobacco Use  . Smoking status: Former Research scientist (life sciences)  . Smokeless tobacco: Never Used  . Tobacco comment: Smoked from age [age]  Substance and Sexual Activity  . Alcohol use: Yes    Comment: occasional glass of wine  . Drug use: No  . Sexual activity: Not Currently  Other Topics Concern  . Not on file  Social History Narrative   Lives at [facility] independent living    Caffeine use: occasional cup of coffee, 2-3 times a week   Left handed         Diet: Regular      Do you drink/ eat things with caffeine? No      Marital status: Widowed                              What year were you married ? 1986      Do you live in a house, apartment,assistred living, condo, trailer, etc.)? [place]       Is it one or more stories? 4 th floor      How many persons live in your home ? 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  Yes                            Type & how often 5-6 x WK      ADVANCED DIRECTIVES (Please bring copies)      Do you have a living will? Yes      Do you have a DNR form?   Yes                    If not, do you want to discuss one?       Do you have signed POA?HPOA forms?   Yes              If so, please bring to your appointment      FUNCTIONAL STATUS- To be completed by Spouse / child / Staff       Do you have difficulty bathing or dressing yourself ? No      Do you have difficulty preparing food or eating ?  No      Do you have difficulty managing your mediation ?No  Do you have difficulty managing your finances ? No      Do you have difficulty  affording your medication ? No      Social Determinants of Health   Financial Resource Strain:   . Difficulty of Paying Living Expenses:   Food Insecurity:   . Worried About Charity fundraiser in the Last Year:   . Arboriculturist in the Last Year:   Transportation Needs:   . Film/video editor (Medical):   Marland Kitchen Lack of Transportation (Non-Medical):   Physical Activity:   . Days of Exercise per Week:   . Minutes of Exercise per Session:   Stress:   . Feeling of Stress :   Social Connections:   . Frequency of Communication with Friends and Family:   . Frequency of Social Gatherings with Friends and Family:   . Attends Religious Services:   . Active Member of Clubs or Organizations:   . Attends Archivist Meetings:   Marland Kitchen Marital Status:   Intimate Partner Violence:   . Fear of Current or Ex-Partner:   . Emotionally Abused:   Marland Kitchen Physically Abused:   . Sexually Abused:     Functional Status Survey:    Family History  Problem Relation Age of Onset  . Asthma Mother   . Pulmonary embolism Mother   . Macular degeneration Mother   . Heart disease Father   . Stroke Father   . Stroke Paternal Uncle   . Breast cancer Maternal Aunt   . Macular degeneration Sister   . Stroke Sister   . Neuropathy Neg Hx     Health Maintenance  Topic Date Due  . TETANUS/TDAP  Never done  . DEXA SCAN  Never done  . PNA vac Low Risk Adult (1 of 2 - PCV13) Never done  . INFLUENZA VACCINE  06/30/2020    Allergies  Allergen Reactions  . Codeine Rash  . Penicillins Rash    Has patient had a PCN reaction causing immediate rash, facial/tongue/throat swelling, SOB or lightheadedness with hypotension: No Has patient had a PCN reaction causing severe rash involving mucus membranes or skin necrosis: No Has patient had a PCN reaction that required hospitalization: No Has patient had a PCN reaction occurring within the last 10 years: No If all of the above answers are "NO", then may  proceed with Cephalosporin use.     Allergies as of 03/12/2020      Reactions   Codeine Rash   Penicillins Rash   Has patient had a PCN reaction causing immediate rash, facial/tongue/throat swelling, SOB or lightheadedness with hypotension: No Has patient had a PCN reaction causing severe rash involving mucus membranes or skin necrosis: No Has patient had a PCN reaction that required hospitalization: No Has patient had a PCN reaction occurring within the last 10 years: No If all of the above answers are "NO", then may proceed with Cephalosporin use.      Medication List       Accurate as of March 12, 2020 10:53 AM. If you have any questions, ask your nurse or doctor.        STOP taking these medications   PROBIOTIC PO Stopped by: [name], MD     TAKE these medications   aspirin 81 MG EC tablet Take 1 tablet (81 mg total) by mouth 2 (two) times daily.   CALCIUM 500/D PO Take 500 mg by mouth daily.   ferrous sulfate 325 (65 FE)  MG tablet Take 1 tablet (325 mg total) by mouth 2 (two) times daily with a meal.   HAIR SKIN NAILS PO Take by mouth daily.   hydrochlorothiazide 12.5 MG capsule Commonly known as: MICROZIDE Take 12.5 mg by mouth 2 (two) times a week. Monday & Friday.   HYDROcodone-acetaminophen 7.5-325 MG tablet Commonly known as: NORCO Take 1-2 tablets by mouth every 4 (four) hours as needed for severe pain (pain score 7-10).   ibandronate 150 MG tablet Commonly known as: BONIVA Take 150 mg by mouth every 30 (thirty) days. Take in the morning with a full glass of water, on an empty stomach, and do not take anything else by mouth or lie down for the next 30 min.   levothyroxine 75 MCG tablet Commonly known as: SYNTHROID Take 75 mcg by mouth daily before breakfast.   LORazepam 0.5 MG tablet Commonly known as: ATIVAN Take 0.5 tablets (0.25 mg total) by mouth at bedtime as needed for anxiety or sleep.   melatonin 5 MG Tabs Take 5 mg by mouth at  bedtime.   omeprazole 20 MG capsule Commonly known as: PRILOSEC Take 20 mg by mouth daily as needed.   polyethylene glycol 17 g packet Commonly known as: MIRALAX / GLYCOLAX Take 17 g by mouth daily as needed.   polyethylene glycol 17 g packet Commonly known as: MIRALAX / GLYCOLAX Take 17 g by mouth daily.   PREVAGEN PO Take by mouth daily.   saccharomyces boulardii 250 MG capsule Commonly known as: FLORASTOR Take 250 mg by mouth 2 (two) times daily.   senna-docusate 8.6-50 MG tablet Commonly known as: Senokot-S Take 2 tablets by mouth 2 (two) times daily.   THERATEARS OP Place 1 drop into both eyes 2 (two) times daily.   TYLENOL EXTRA STRENGTH PO Take by mouth every 4 (four) hours as needed.   Vitamin D (Cholecalciferol) 50 MCG (2000 UT) Caps Take 1 tablet by mouth daily.   zolpidem 10 MG tablet Commonly known as: AMBIEN Take 5 mg by mouth at bedtime as needed for sleep.       Review of Systems  Constitutional: Positive for appetite change.  HENT: Negative.   Respiratory: Negative.   Cardiovascular: Negative.   Gastrointestinal: Positive for constipation and nausea.  Musculoskeletal: Positive for arthralgias.  Skin: Negative.   Neurological: Positive for weakness.  Psychiatric/Behavioral: Positive for sleep disturbance. The patient is nervous/anxious.   All other systems reviewed and are negative.   Vitals:   03/12/20 1026  BP: 110/60  Pulse: 92  Resp: 20  Temp: 98.8 F (37.1 C)  SpO2: 96%  Weight: 105 lb 9.6 oz (47.9 kg)  Height: 5' (1.524 m)   Body mass index is 20.62 kg/m. Physical Exam Vitals reviewed.  Constitutional:      Appearance: Normal appearance.  HENT:     Head: Normocephalic.     Nose: Nose normal.     Mouth/Throat:     Mouth: Mucous membranes are moist.     Pharynx: Oropharynx is clear.  Eyes:     Pupils: Pupils are equal, round, and reactive to light.  Cardiovascular:     Rate and Rhythm: Normal rate and regular rhythm.       Pulses: Normal pulses.  Pulmonary:     Effort: Pulmonary effort is normal. No respiratory distress.     Breath sounds: Normal breath sounds. No wheezing or rales.  Abdominal:     General: Abdomen is flat. Bowel sounds are normal.  Palpations: Abdomen is soft.  Musculoskeletal:        General: No swelling.     Cervical back: Neck supple.  Skin:    General: Skin is warm.  Neurological:     Mental Status: She is alert and oriented to person, place, and time.     Comments: Slight weak on Left leg  Psychiatric:        Mood and Affect: Mood normal.        Thought Content: Thought content normal.     Labs reviewed: Basic Metabolic Panel: Recent Labs    03/02/20 2255 03/02/20 2255 03/04/20 0452 03/05/20 0437 03/08/20 0000  NA 135   < > 133* 137 133*  K 4.4   < > 4.6 4.6 4.1  CL 97*   < > 105 109 101  CO2 26   < > 22 24 27*  GLUCOSE 162*  --  124* 102*  --   BUN 20   < > 12 12 9   CREATININE 0.80   < > 0.58 0.61 0.6  CALCIUM 9.9   < > 8.2* 8.0* 8.1*   < > = values in this interval not displayed.   Liver Function Tests: Recent Labs    03/08/20 0000  AST 19  ALT 6*  ALKPHOS 43  ALBUMIN 3.2*   No results for input(s): LIPASE, AMYLASE in the last 8760 hours. No results for input(s): AMMONIA in the last 8760 hours. CBC: Recent Labs    03/02/20 2255 03/02/20 2255 03/04/20 0452 03/05/20 0437 03/08/20 0000  WBC 15.6*   < > 8.4 7.2 6.2  NEUTROABS 13.4*  --   --  3.9 3,819  HGB 12.6   < > 7.7* 7.0* 9.2*  HCT 37.7   < > 23.2* 21.5* 27*  MCV 98.2  --  100.9* 100.9*  --   PLT 324   < > 226 220 339   < > = values in this interval not displayed.   Cardiac Enzymes: No results for input(s): CKTOTAL, CKMB, CKMBINDEX, TROPONINI in the last 8760 hours. BNP: Invalid input(s): POCBNP Lab Results  Component Value Date   HGBA1C 5.9 (H) 03/30/2018   Lab Results  Component Value Date   TSH 2.61 03/08/2020   Lab Results  Component Value Date   VITAMINB12 760  12/01/2017   Lab Results  Component Value Date   FOLATE 19.8 12/01/2017   No results found for: IRON, TIBC, FERRITIN  Imaging and Procedures obtained prior to SNF admission: CT HEAD WO CONTRAST  Result Date: 03/02/2020 CLINICAL DATA:  Head trauma, struck in head by a potted plant causing patient to fall to ground EXAM: CT HEAD WITHOUT CONTRAST TECHNIQUE: Contiguous axial images were obtained from the base of the skull through the vertex without intravenous contrast. Sagittal and coronal MPR images reconstructed from axial data set. COMPARISON:  03/25/2012 FINDINGS: Brain: Generalized atrophy. Normal ventricular morphology. No midline shift or mass effect. Small vessel chronic ischemic changes of deep cerebral white matter. No intracranial hemorrhage, mass lesion, evidence of acute infarction, or extra-axial fluid collection. Vascular: No hyperdense vessels Skull: Intact Sinuses/Orbits: Clear Other: N/A IMPRESSION: Atrophy with minimal small vessel chronic ischemic changes of deep cerebral white matter. No acute intracranial abnormalities. Electronically Signed   By: Lavonia Dana M.D.   On: 03/02/2020 23:22   DG Chest Port 1 View  Result Date: 03/02/2020 CLINICAL DATA:  LEFT hip pain post fall EXAM: PORTABLE CHEST 1 VIEW COMPARISON:  Portable exam 2317 hours  compared to 12/01/2016 FINDINGS: Normal heart size, mediastinal contours, and pulmonary vascularity. Mild scarring at RIGHT base. Lungs otherwise clear. Calcified granuloma RIGHT mid lung. No infiltrate, pleural effusion, or pneumothorax. Bones demineralized. IMPRESSION: No acute abnormalities. Mild scarring RIGHT base. Electronically Signed   By: Lavonia Dana M.D.   On: 03/02/2020 23:24   DG C-Arm 1-60 Min-No Report  Result Date: 03/03/2020 Fluoroscopy was utilized by the requesting physician.  No radiographic interpretation.   DG HIP OPERATIVE UNILAT W OR W/O PELVIS LEFT  Result Date: 03/03/2020 CLINICAL DATA:  Left hip intramedullary nail  EXAM: OPERATIVE LEFT HIP (WITH PELVIS IF PERFORMED) 2 VIEWS TECHNIQUE: Fluoroscopic spot image(s) were submitted for interpretation post-operatively. COMPARISON:  None. FINDINGS: Intraoperative fluoroscopic views of the left hip demonstrating intramedullary nail fixation are submitted for review. IMPRESSION: Intraoperative fluoroscopic views of the left hip demonstrating intramedullary nail fixation are submitted for review. Electronically Signed   By: Eddie Candle M.D.   On: 03/03/2020 14:04   DG Hip Unilat W or Wo Pelvis 2-3 Views Left  Result Date: 03/02/2020 CLINICAL DATA:  Struck in the head by a potted plant, fell to ground, LEFT hip pain EXAM: DG HIP (WITH OR WITHOUT PELVIS) 2-3V LEFT COMPARISON:  Pelvic radiograph 03/25/2012 FINDINGS: Osseous demineralization. Prior lumbosacral fusion. Narrowing of hip joints bilaterally. SI joints preserved. Comminuted mildly displaced intertrochanteric fracture LEFT femur. No dislocation. Pelvis intact. IMPRESSION: Comminuted displaced intertrochanteric fracture LEFT femur. Electronically Signed   By: Lavonia Dana M.D.   On: 03/02/2020 23:23    Assessment/Plan  Dysuria with Frequency and Low grade temp Get UA and Culture Her whit count was slightly elevated today at 11 from 7.2 On aspirin BID   S/p left hip fracture Discontinue Norco and start on Oxycodone and Tylenol Follow up with Dr Alvan Dame  Partial Weight bearing right now On Aspirin 2/day  Age-related osteoporosis with current pathological fracture, sequela Continue Boniva Calcium and Vit D Memory deficit MMSE in 11/2017 was 30/30  Anemia Hgb today is 10.2 Reduce Iron to 3/week Repeat CBC Insomnia On Ambien and Melatonin Anxiety Will continue on Ativan BID pRN for 14 days   Family/ staff Communication:   Labs/tests ordered: Total time spent in this patient care encounter was  45_  minutes; greater than 50% of the visit spent counseling patient and staff, reviewing records , Labs and  coordinating care for problems addressed at this encounter.

## 2020-03-15 ENCOUNTER — Other Ambulatory Visit: Payer: Self-pay

## 2020-03-15 ENCOUNTER — Encounter: Payer: Medicare Other | Admitting: Internal Medicine

## 2020-03-15 ENCOUNTER — Encounter: Payer: Self-pay | Admitting: Internal Medicine

## 2020-03-15 NOTE — Progress Notes (Signed)
Location:   [facility] Room Number: 42 Place of Service:  SNF [id number]) Provider: [name], MD  [name], MD  Patient Care Team: [name], MD as PCP - General (Internal Medicine)  Extended Emergency Contact Information Primary Emergency Contact: Katherine Mantle of [place] Phone: [phone] Relation: Daughter Secondary Emergency Contact: Milon Dikes [place] of [place] Phone: [phone] Mobile Phone: [phone] Relation: Friend  Code Status:   Goals of care: Advanced Directive information Advanced Directives 03/15/2020  Does Patient Have a Medical Advance Directive? Yes  Type of Advance Directive Out of facility DNR (pink MOST or yellow form);Living will  Does patient want to make changes to medical advance directive? No - Patient declined  Copy of [place] in Chart? -  Would patient like information on creating a medical advance directive? -  Pre-existing out of facility DNR order (yellow form or pink MOST form) Pink MOST form placed in chart (order not valid for inpatient use)     Chief Complaint  Patient presents with  . Medical Management of Chronic Issues    Patient was seen in SNF today.    HPI:  Pt is a 86 y.o. female seen today for medical management of chronic diseases.     Past Medical History:  Diagnosis Date  . Abdominal pain 02/26/2017  . Anxiety   . Arthritis    Left Knee, Wrist, Back and Hips  . Cervical vertebral fusion   . Diverticulitis   . Diverticulosis   . Fibromyalgia   . GERD (gastroesophageal reflux disease)   . Gout 02/2018   L hand  . Hypothyroidism   . Pelvis fracture ([place])   . Peritoneal free air 03/29/2012  . Pneumonia   . Pneumoperitoneum 02/26/2017  . Thyroid disorder   . Toe pain 09/06/2016   Past Surgical History:  Procedure Laterality Date  . ABDOMINAL HYSTERECTOMY    . BOWEL RESECTION N/A 07/09/2017   Procedure: SMALL BOWEL  RESECTION;  Surgeon: Johnathan Hausen, MD;  Location: [facility];  Service: General;  Laterality: N/A;  . CERVICAL FUSION     x2  . FEMUR IM NAIL Left 03/03/2020   Procedure: INTRAMEDULLARY (IM) NAIL FEMORAL;  Surgeon: Paralee Cancel, MD;  Location: [facility];  Service: Orthopedics;  Laterality: Left;  . LAPAROSCOPIC APPENDECTOMY N/A 03/04/2017   Procedure: APPENDECTOMY LAPAROSCOPIC;  Surgeon: Johnathan Hausen, MD;  Location: [facility];  Service: General;  Laterality: N/A;  . LAPAROSCOPY N/A 03/04/2017   Procedure: LAPAROSCOPY DIAGNOSTIC, ENTEROLYSIS;  Surgeon: Johnathan Hausen, MD;  Location: [facility];  Service: General;  Laterality: N/A;  . LAPAROTOMY N/A 07/09/2017   Procedure: EXPLORATORY LAPAROTOMY;  Surgeon: Johnathan Hausen, MD;  Location: [facility];  Service: General;  Laterality: N/A;  . SPINE SURGERY     Lumbar- rod placement  . TONSILLECTOMY     86y/o  . TOTAL VAGINAL HYSTERECTOMY      Allergies  Allergen Reactions  . Codeine Rash  . Penicillins Rash    Has patient had a PCN reaction causing immediate rash, facial/tongue/throat swelling, SOB or lightheadedness with hypotension: No Has patient had a PCN reaction causing severe rash involving mucus membranes or skin necrosis: No Has patient had a PCN reaction that required hospitalization: No Has patient had a PCN reaction occurring within the last 10 years: No If all of the above answers are "NO", then may proceed with Cephalosporin use.     Allergies as of 03/15/2020  Reactions   Codeine Rash   Penicillins Rash   Has patient had a PCN reaction causing immediate rash, facial/tongue/throat swelling, SOB or lightheadedness with hypotension: No Has patient had a PCN reaction causing severe rash involving mucus membranes or skin necrosis: No Has patient had a PCN reaction that required hospitalization: No Has patient had a PCN reaction occurring within the last 10 years: No If all of the above answers are "NO", then may proceed with Cephalosporin  use.      Medication List       Accurate as of March 15, 2020  9:41 AM. If you have any questions, ask your nurse or doctor.        aspirin 81 MG EC tablet Take 1 tablet (81 mg total) by mouth 2 (two) times daily.   CALCIUM 500/D PO Take 500 mg by mouth daily.   ferrous sulfate 325 (65 FE) MG tablet Take 1 tablet (325 mg total) by mouth 2 (two) times daily with a meal. What changed: when to take this   HAIR SKIN NAILS PO Take by mouth daily.   hydrochlorothiazide 12.5 MG capsule Commonly known as: MICROZIDE Take 12.5 mg by mouth 2 (two) times a week. Monday & Friday.   ibandronate 150 MG tablet Commonly known as: BONIVA Take 150 mg by mouth every 30 (thirty) days. Take in the morning with a full glass of water, on an empty stomach, and do not take anything else by mouth or lie down for the next 30 min.   levothyroxine 75 MCG tablet Commonly known as: SYNTHROID Take 75 mcg by mouth daily before breakfast.   LORazepam 0.5 MG tablet Commonly known as: Ativan Take 0.5 tablets (0.25 mg total) by mouth 2 (two) times daily as needed for anxiety.   melatonin 5 MG Tabs Take 5 mg by mouth at bedtime.   omeprazole 20 MG capsule Commonly known as: PRILOSEC Take 20 mg by mouth daily as needed.   oxyCODONE 5 MG immediate release tablet Commonly known as: Oxy IR/ROXICODONE Take 1 tablet (5 mg total) by mouth every 6 (six) hours as needed for up to 14 days for severe pain.   polyethylene glycol 17 g packet Commonly known as: MIRALAX / GLYCOLAX Take 17 g by mouth daily as needed.   polyethylene glycol 17 g packet Commonly known as: MIRALAX / GLYCOLAX Take 17 g by mouth daily.   PREVAGEN PO Take by mouth daily.   saccharomyces boulardii 250 MG capsule Commonly known as: FLORASTOR Take 250 mg by mouth 2 (two) times daily.   senna-docusate 8.6-50 MG tablet Commonly known as: Senokot-S Take 2 tablets by mouth 2 (two) times daily.   THERATEARS OP Place 1 drop into both  eyes 2 (two) times daily.   TYLENOL EXTRA STRENGTH PO Take by mouth every 4 (four) hours as needed.   acetaminophen 500 MG tablet Commonly known as: TYLENOL Take 1,000 mg by mouth daily.   Vitamin D (Cholecalciferol) 50 MCG (2000 UT) Caps Take 1 tablet by mouth daily.   zolpidem 10 MG tablet Commonly known as: AMBIEN Take 5 mg by mouth at bedtime as needed for sleep.       Review of Systems  Immunization History  Administered Date(s) Administered  . Influenza-Unspecified 09/01/2018  . Moderna SARS-COVID-2 Vaccination 12/04/2019, 01/01/2020  . Zoster Recombinat (Shingrix) 07/05/2018   Pertinent  Health Maintenance Due  Topic Date Due  . DEXA SCAN  Never done  . PNA vac Low Risk Adult (1  of 2 - PCV13) Never done  . INFLUENZA VACCINE  06/30/2020   No flowsheet data found. Functional Status Survey:    Vitals:   03/15/20 0932  BP: 114/60  Pulse: 70  Resp: 18  Temp: (!) 87 F (30.6 C)  SpO2: 97%  Weight: 96 lb 11.2 oz (43.9 kg)  Height: 5' (1.524 m)   Body mass index is 18.89 kg/m. Physical Exam  Labs reviewed: Recent Labs    03/02/20 2255 03/02/20 2255 03/04/20 0452 03/05/20 0437 03/08/20 0000  NA 135   < > 133* 137 133*  K 4.4   < > 4.6 4.6 4.1  CL 97*   < > 105 109 101  CO2 26   < > 22 24 27*  GLUCOSE 162*  --  124* 102*  --   BUN 20   < > 12 12 9   CREATININE 0.80   < > 0.58 0.61 0.6  CALCIUM 9.9   < > 8.2* 8.0* 8.1*   < > = values in this interval not displayed.   Recent Labs    03/08/20 0000  AST 19  ALT 6*  ALKPHOS 43  ALBUMIN 3.2*   Recent Labs    03/02/20 2255 03/02/20 2255 03/04/20 0452 03/05/20 0437 03/08/20 0000  WBC 15.6*   < > 8.4 7.2 6.2  NEUTROABS 13.4*  --   --  3.9 3,819  HGB 12.6   < > 7.7* 7.0* 9.2*  HCT 37.7   < > 23.2* 21.5* 27*  MCV 98.2  --  100.9* 100.9*  --   PLT 324   < > 226 220 339   < > = values in this interval not displayed.   Lab Results  Component Value Date   TSH 2.61 03/08/2020   Lab Results    Component Value Date   HGBA1C 5.9 (H) 03/30/2018   No results found for: CHOL, HDL, LDLCALC, LDLDIRECT, TRIG, CHOLHDL  Significant Diagnostic Results in last 30 days:  CT HEAD WO CONTRAST  Result Date: 03/02/2020 CLINICAL DATA:  Head trauma, struck in head by a potted plant causing patient to fall to ground EXAM: CT HEAD WITHOUT CONTRAST TECHNIQUE: Contiguous axial images were obtained from the base of the skull through the vertex without intravenous contrast. Sagittal and coronal MPR images reconstructed from axial data set. COMPARISON:  03/25/2012 FINDINGS: Brain: Generalized atrophy. Normal ventricular morphology. No midline shift or mass effect. Small vessel chronic ischemic changes of deep cerebral white matter. No intracranial hemorrhage, mass lesion, evidence of acute infarction, or extra-axial fluid collection. Vascular: No hyperdense vessels Skull: Intact Sinuses/Orbits: Clear Other: N/A IMPRESSION: Atrophy with minimal small vessel chronic ischemic changes of deep cerebral white matter. No acute intracranial abnormalities. Electronically Signed   By: Lavonia Dana M.D.   On: 03/02/2020 23:22   DG Chest Port 1 View  Result Date: 03/02/2020 CLINICAL DATA:  LEFT hip pain post fall EXAM: PORTABLE CHEST 1 VIEW COMPARISON:  Portable exam 2317 hours compared to 12/01/2016 FINDINGS: Normal heart size, mediastinal contours, and pulmonary vascularity. Mild scarring at RIGHT base. Lungs otherwise clear. Calcified granuloma RIGHT mid lung. No infiltrate, pleural effusion, or pneumothorax. Bones demineralized. IMPRESSION: No acute abnormalities. Mild scarring RIGHT base. Electronically Signed   By: Lavonia Dana M.D.   On: 03/02/2020 23:24   DG C-Arm 1-60 Min-No Report  Result Date: 03/03/2020 Fluoroscopy was utilized by the requesting physician.  No radiographic interpretation.   DG HIP OPERATIVE UNILAT W OR W/O PELVIS LEFT  Result Date: 03/03/2020 CLINICAL DATA:  Left hip intramedullary nail EXAM:  OPERATIVE LEFT HIP (WITH PELVIS IF PERFORMED) 2 VIEWS TECHNIQUE: Fluoroscopic spot image(s) were submitted for interpretation post-operatively. COMPARISON:  None. FINDINGS: Intraoperative fluoroscopic views of the left hip demonstrating intramedullary nail fixation are submitted for review. IMPRESSION: Intraoperative fluoroscopic views of the left hip demonstrating intramedullary nail fixation are submitted for review. Electronically Signed   By: Eddie Candle M.D.   On: 03/03/2020 14:04   DG Hip Unilat W or Wo Pelvis 2-3 Views Left  Result Date: 03/02/2020 CLINICAL DATA:  Struck in the head by a potted plant, fell to ground, LEFT hip pain EXAM: DG HIP (WITH OR WITHOUT PELVIS) 2-3V LEFT COMPARISON:  Pelvic radiograph 03/25/2012 FINDINGS: Osseous demineralization. Prior lumbosacral fusion. Narrowing of hip joints bilaterally. SI joints preserved. Comminuted mildly displaced intertrochanteric fracture LEFT femur. No dislocation. Pelvis intact. IMPRESSION: Comminuted displaced intertrochanteric fracture LEFT femur. Electronically Signed   By: Lavonia Dana M.D.   On: 03/02/2020 23:23    Assessment/Plan There are no diagnoses linked to this encounter.   Family/ staff Communication:   Labs/tests ordered:

## 2020-03-18 ENCOUNTER — Non-Acute Institutional Stay (SKILLED_NURSING_FACILITY): Payer: Medicare Other | Admitting: Nurse Practitioner

## 2020-03-18 ENCOUNTER — Encounter: Payer: Self-pay | Admitting: Nurse Practitioner

## 2020-03-18 DIAGNOSIS — M79672 Pain in left foot: Secondary | ICD-10-CM | POA: Diagnosis not present

## 2020-03-18 DIAGNOSIS — G629 Polyneuropathy, unspecified: Secondary | ICD-10-CM | POA: Diagnosis not present

## 2020-03-18 DIAGNOSIS — F419 Anxiety disorder, unspecified: Secondary | ICD-10-CM

## 2020-03-18 DIAGNOSIS — I872 Venous insufficiency (chronic) (peripheral): Secondary | ICD-10-CM | POA: Diagnosis not present

## 2020-03-18 DIAGNOSIS — D500 Iron deficiency anemia secondary to blood loss (chronic): Secondary | ICD-10-CM

## 2020-03-18 DIAGNOSIS — M79675 Pain in left toe(s): Secondary | ICD-10-CM | POA: Insufficient documentation

## 2020-03-18 DIAGNOSIS — F5105 Insomnia due to other mental disorder: Secondary | ICD-10-CM

## 2020-03-18 NOTE — Assessment & Plan Note (Signed)
Failed gabapentin in the past.

## 2020-03-18 NOTE — Assessment & Plan Note (Signed)
Hx of HCTZ use, hyponatremia associated with higher dose.

## 2020-03-18 NOTE — Progress Notes (Addendum)
Location:   [place] Room Number: 65 Place of Service:  SNF (31) Provider: Marlana Latus NP   Patient Care Team: [name], MD as PCP - General (Internal Medicine)  Extended Emergency Contact Information Primary Emergency Contact: Katherine Mantle of [place] Phone: [phone] Relation: Daughter Secondary Emergency Contact: Milon Dikes [place] of [place] Phone: [phone] Mobile Phone: [phone] Relation: Friend  Code Status: DNR Goals of care: Advanced Directive information Advanced Directives 03/18/2020  Does Patient Have a Medical Advance Directive? Yes  Type of Advance Directive Living will;Out of facility DNR (pink MOST or yellow form)  Does patient want to make changes to medical advance directive? No - Patient declined  Copy of [place] in Chart? -  Would patient like information on creating a medical advance directive? -  Pre-existing out of facility DNR order (yellow form or pink MOST form) Pink MOST form placed in chart (order not valid for inpatient use)     Chief Complaint  Patient presents with  . Acute Visit    Left Foot Pain    HPI:  Pt is a 86 y.o. female seen today for an acute visit for reported the patient's left foot pain, redness, warmth, swelling lateral left foot, ? Gout, no Hx of gout, hx of peripheral neuropathy,venous insufficiency. Recent left hip fx repair, healing, prn Oxycodone, Tylenol for pain. Her mood/sleep, maintained on Zolpidem, Lorazepam. Anemia, stable, on Fe.    Past Medical History:  Diagnosis Date  . Abdominal pain 02/26/2017  . Anxiety   . Arthritis    Left Knee, Wrist, Back and Hips  . Cervical vertebral fusion   . Diverticulitis   . Diverticulosis   . Fibromyalgia   . GERD (gastroesophageal reflux disease)   . Gout 02/2018   L hand  . Hypothyroidism   . Pelvis fracture ([place])   . Peritoneal free air 03/29/2012  . Pneumonia   .  Pneumoperitoneum 02/26/2017  . Thyroid disorder   . Toe pain 09/06/2016   Past Surgical History:  Procedure Laterality Date  . ABDOMINAL HYSTERECTOMY    . BOWEL RESECTION N/A 07/09/2017   Procedure: SMALL BOWEL RESECTION;  Surgeon: Johnathan Hausen, MD;  Location: [facility];  Service: General;  Laterality: N/A;  . CERVICAL FUSION     x2  . FEMUR IM NAIL Left 03/03/2020   Procedure: INTRAMEDULLARY (IM) NAIL FEMORAL;  Surgeon: Paralee Cancel, MD;  Location: [facility];  Service: Orthopedics;  Laterality: Left;  . LAPAROSCOPIC APPENDECTOMY N/A 03/04/2017   Procedure: APPENDECTOMY LAPAROSCOPIC;  Surgeon: Johnathan Hausen, MD;  Location: [facility];  Service: General;  Laterality: N/A;  . LAPAROSCOPY N/A 03/04/2017   Procedure: LAPAROSCOPY DIAGNOSTIC, ENTEROLYSIS;  Surgeon: Johnathan Hausen, MD;  Location: [facility];  Service: General;  Laterality: N/A;  . LAPAROTOMY N/A 07/09/2017   Procedure: EXPLORATORY LAPAROTOMY;  Surgeon: Johnathan Hausen, MD;  Location: [facility];  Service: General;  Laterality: N/A;  . SPINE SURGERY     Lumbar- rod placement  . TONSILLECTOMY     86y/o  . TOTAL VAGINAL HYSTERECTOMY      Allergies  Allergen Reactions  . Codeine Rash  . Penicillins Rash    Has patient had a PCN reaction causing immediate rash, facial/tongue/throat swelling, SOB or lightheadedness with hypotension: No Has patient had a PCN reaction causing severe rash involving mucus membranes or skin necrosis: No Has patient had a PCN reaction that required hospitalization: No Has patient had a PCN  reaction occurring within the last 10 years: No If all of the above answers are "NO", then may proceed with Cephalosporin use.   . Levaquin [Levofloxacin]     Allergies as of 03/18/2020      Reactions   Codeine Rash   Penicillins Rash   Has patient had a PCN reaction causing immediate rash, facial/tongue/throat swelling, SOB or lightheadedness with hypotension: No Has patient had a PCN reaction causing severe rash involving  mucus membranes or skin necrosis: No Has patient had a PCN reaction that required hospitalization: No Has patient had a PCN reaction occurring within the last 10 years: No If all of the above answers are "NO", then may proceed with Cephalosporin use.   Levaquin [levofloxacin]       Medication List       Accurate as of March 18, 2020 11:59 PM. If you have any questions, ask your nurse or doctor.        STOP taking these medications   ibandronate 150 MG tablet Commonly known as: BONIVA Stopped by: Jull Harral X Madisun Hargrove, NP   oxyCODONE 5 MG immediate release tablet Commonly known as: Oxy IR/ROXICODONE Stopped by: Delroy Ordway X Rolfe Hartsell, NP     TAKE these medications   aspirin 81 MG EC tablet Take 1 tablet (81 mg total) by mouth 2 (two) times daily.   CALCIUM 500/D PO Take 500 mg by mouth daily.   ciprofloxacin 500 MG tablet Commonly known as: CIPRO Take 500 mg by mouth 2 (two) times daily.   ferrous sulfate 325 (65 FE) MG tablet Take 325 mg by mouth daily. On Monday, Wednesday, and Friday. What changed: Another medication with the same name was removed. Continue taking this medication, and follow the directions you see here. Changed by: Yanna Leaks X Brinleigh Tew, NP   HAIR SKIN NAILS PO Take by mouth daily.   hydrochlorothiazide 12.5 MG capsule Commonly known as: MICROZIDE Take 12.5 mg by mouth 2 (two) times a week. Monday & Friday.   levothyroxine 75 MCG tablet Commonly known as: SYNTHROID Take 75 mcg by mouth daily before breakfast.   LORazepam 0.5 MG tablet Commonly known as: Ativan Take 0.5 tablets (0.25 mg total) by mouth 2 (two) times daily as needed for anxiety.   melatonin 5 MG Tabs Take 5 mg by mouth at bedtime.   omeprazole 20 MG capsule Commonly known as: PRILOSEC Take 20 mg by mouth daily as needed.   OXYCODONE ER PO Take 5 mg by mouth every 4 (four) hours as needed.   polyethylene glycol 17 g packet Commonly known as: MIRALAX / GLYCOLAX Take 17 g by mouth daily as needed.     polyethylene glycol 17 g packet Commonly known as: MIRALAX / GLYCOLAX Take 17 g by mouth daily.   PREVAGEN PO Take by mouth daily.   saccharomyces boulardii 250 MG capsule Commonly known as: FLORASTOR Take 250 mg by mouth 2 (two) times daily.   senna-docusate 8.6-50 MG tablet Commonly known as: Senokot-S Take 2 tablets by mouth 2 (two) times daily.   THERATEARS OP Place 1 drop into both eyes 2 (two) times daily.   TYLENOL EXTRA STRENGTH PO Take 500 mg by mouth every 4 (four) hours as needed.   acetaminophen 500 MG tablet Commonly known as: TYLENOL Take 1,000 mg by mouth daily.   Vitamin D (Cholecalciferol) 50 MCG (2000 UT) Caps Take 1 tablet by mouth daily.   zolpidem 10 MG tablet Commonly known as: AMBIEN Take 5 mg by mouth at bedtime  as needed for sleep.       Review of Systems  Constitutional: Negative for activity change, appetite change, fatigue and fever.  HENT: Negative for congestion and voice change.   Eyes: Negative for visual disturbance.  Respiratory: Negative for cough and shortness of breath.   Cardiovascular: Positive for leg swelling.       Mostly LLE  Gastrointestinal: Negative for abdominal distention, abdominal pain and constipation.  Genitourinary: Negative for difficulty urinating, dysuria and urgency.  Musculoskeletal: Positive for arthralgias, back pain and gait problem.       Left hip pain, left lateral foot pain(5th MTJ), chronic left knee pain-due for inj 03/21/20  Skin: Positive for color change.  Neurological: Negative for speech difficulty, weakness and light-headedness.  Psychiatric/Behavioral: Positive for sleep disturbance. Negative for agitation and behavioral problems. The patient is not nervous/anxious.     Immunization History  Administered Date(s) Administered  . Influenza-Unspecified 09/01/2018  . Moderna SARS-COVID-2 Vaccination 12/04/2019, 01/01/2020  . Zoster Recombinat (Shingrix) 07/05/2018   Pertinent  Health  Maintenance Due  Topic Date Due  . DEXA SCAN  Never done  . PNA vac Low Risk Adult (1 of 2 - PCV13) Never done  . INFLUENZA VACCINE  06/30/2020   No flowsheet data found. Functional Status Survey:    Vitals:   03/18/20 1550  BP: 114/64  Pulse: 89  Resp: 20  Temp: 98.2 F (36.8 C)  SpO2: 96%  Weight: 96 lb 11.2 oz (43.9 kg)  Height: 5' (1.524 m)   Body mass index is 18.89 kg/m. Physical Exam Constitutional:      General: She is not in acute distress.    Appearance: Normal appearance. She is not ill-appearing.  HENT:     Head: Normocephalic and atraumatic.     Nose: Nose normal.     Mouth/Throat:     Mouth: Mucous membranes are moist.  Eyes:     Extraocular Movements: Extraocular movements intact.     Conjunctiva/sclera: Conjunctivae normal.     Pupils: Pupils are equal, round, and reactive to light.  Cardiovascular:     Rate and Rhythm: Normal rate and regular rhythm.     Heart sounds: No murmur.     Comments: Weak left DP pulse, more symptomatic tingling sensation at night.  Pulmonary:     Effort: Pulmonary effort is normal.     Breath sounds: Rales present. No wheezing or rhonchi.     Comments: Right base Abdominal:     General: Bowel sounds are normal. There is no distension.     Palpations: Abdomen is soft.     Tenderness: There is no abdominal tenderness.  Musculoskeletal:     Cervical back: Normal range of motion and neck supple.     Right lower leg: No edema.     Left lower leg: Edema present.     Comments: Trace edema LLE, mild scoliosis. Lateal left foot 5th MTJ region redness, pain, warmth  Skin:    General: Skin is warm and dry.     Findings: Erythema present.     Comments: S/p L hip IM nail. Lateral left 5th MTJ redness, warmth, tenderness.   Neurological:     General: No focal deficit present.     Mental Status: She is alert and oriented to person, place, and time. Mental status is at baseline.     Motor: No weakness.     Coordination:  Coordination normal.     Gait: Gait abnormal.     Comments:  Using walker sometimes.   Psychiatric:        Mood and Affect: Mood normal.        Behavior: Behavior normal.        Thought Content: Thought content normal.        Judgment: Judgment normal.     Labs reviewed: Recent Labs    03/02/20 2255 03/02/20 2255 03/04/20 0452 03/05/20 0437 03/08/20 0000  NA 135   < > 133* 137 133*  K 4.4   < > 4.6 4.6 4.1  CL 97*   < > 105 109 101  CO2 26   < > 22 24 27*  GLUCOSE 162*  --  124* 102*  --   BUN 20   < > '12 12 9  ' CREATININE 0.80   < > 0.58 0.61 0.6  CALCIUM 9.9   < > 8.2* 8.0* 8.1*   < > = values in this interval not displayed.   Recent Labs    03/08/20 0000  AST 19  ALT 6*  ALKPHOS 43  ALBUMIN 3.2*   Recent Labs    03/02/20 2255 03/02/20 2255 03/04/20 0452 03/04/20 0452 03/05/20 0437 03/08/20 0000 03/12/20 0000  WBC 15.6*   < > 8.4   < > 7.2 6.2 11.0  NEUTROABS 13.4*   < >  --   --  3.9 3,819 8,448  HGB 12.6   < > 7.7*   < > 7.0* 9.2* 10.2*  HCT 37.7   < > 23.2*   < > 21.5* 27* 31*  MCV 98.2  --  100.9*  --  100.9*  --   --   PLT 324   < > 226   < > 220 339 643*   < > = values in this interval not displayed.   Lab Results  Component Value Date   TSH 2.61 03/08/2020   Lab Results  Component Value Date   HGBA1C 5.9 (H) 03/30/2018   No results found for: CHOL, HDL, LDLCALC, LDLDIRECT, TRIG, CHOLHDL  Significant Diagnostic Results in last 30 days:  CT HEAD WO CONTRAST  Result Date: 03/02/2020 CLINICAL DATA:  Head trauma, struck in head by a potted plant causing patient to fall to ground EXAM: CT HEAD WITHOUT CONTRAST TECHNIQUE: Contiguous axial images were obtained from the base of the skull through the vertex without intravenous contrast. Sagittal and coronal MPR images reconstructed from axial data set. COMPARISON:  03/25/2012 FINDINGS: Brain: Generalized atrophy. Normal ventricular morphology. No midline shift or mass effect. Small vessel chronic  ischemic changes of deep cerebral white matter. No intracranial hemorrhage, mass lesion, evidence of acute infarction, or extra-axial fluid collection. Vascular: No hyperdense vessels Skull: Intact Sinuses/Orbits: Clear Other: N/A IMPRESSION: Atrophy with minimal small vessel chronic ischemic changes of deep cerebral white matter. No acute intracranial abnormalities. Electronically Signed   By: Lavonia Dana M.D.   On: 03/02/2020 23:22   DG Chest Port 1 View  Result Date: 03/02/2020 CLINICAL DATA:  LEFT hip pain post fall EXAM: PORTABLE CHEST 1 VIEW COMPARISON:  Portable exam 2317 hours compared to 12/01/2016 FINDINGS: Normal heart size, mediastinal contours, and pulmonary vascularity. Mild scarring at RIGHT base. Lungs otherwise clear. Calcified granuloma RIGHT mid lung. No infiltrate, pleural effusion, or pneumothorax. Bones demineralized. IMPRESSION: No acute abnormalities. Mild scarring RIGHT base. Electronically Signed   By: Lavonia Dana M.D.   On: 03/02/2020 23:24   DG C-Arm 1-60 Min-No Report  Result Date: 03/03/2020 Fluoroscopy was utilized by the requesting  physician.  No radiographic interpretation.   DG HIP OPERATIVE UNILAT W OR W/O PELVIS LEFT  Result Date: 03/03/2020 CLINICAL DATA:  Left hip intramedullary nail EXAM: OPERATIVE LEFT HIP (WITH PELVIS IF PERFORMED) 2 VIEWS TECHNIQUE: Fluoroscopic spot image(s) were submitted for interpretation post-operatively. COMPARISON:  None. FINDINGS: Intraoperative fluoroscopic views of the left hip demonstrating intramedullary nail fixation are submitted for review. IMPRESSION: Intraoperative fluoroscopic views of the left hip demonstrating intramedullary nail fixation are submitted for review. Electronically Signed   By: Eddie Candle M.D.   On: 03/03/2020 14:04   DG Hip Unilat W or Wo Pelvis 2-3 Views Left  Result Date: 03/02/2020 CLINICAL DATA:  Struck in the head by a potted plant, fell to ground, LEFT hip pain EXAM: DG HIP (WITH OR WITHOUT PELVIS) 2-3V  LEFT COMPARISON:  Pelvic radiograph 03/25/2012 FINDINGS: Osseous demineralization. Prior lumbosacral fusion. Narrowing of hip joints bilaterally. SI joints preserved. Comminuted mildly displaced intertrochanteric fracture LEFT femur. No dislocation. Pelvis intact. IMPRESSION: Comminuted displaced intertrochanteric fracture LEFT femur. Electronically Signed   By: Lavonia Dana M.D.   On: 03/02/2020 23:23    Assessment/Plan: Left foot pain Reported the patient's left foot pain, redness, warmth, swelling lateral left foot, ? Gout, no Hx of gout, hx of peripheral neuropathy, will update CBC/diff, CMP/eGFR, uric acid, ESR, CRP 03/19/20 wbc 7.9, Hgb 9.6, plt 933, neutrophils 68.6, Na 130, K 4.9, Bun 11, creat 0.61, eGFR 82, uric acid 4.4, ESR 17(<30), CRP 16(<8) 03/20/20 apply 3% diclofenac topical gel bid to the lateral left foot 5th MTJ  reddened reddened area x 2 weeks.   Blood loss anemia Hgb 9.2 03/07/20, continue Fe, update CBC/diff.   Neuropathy Failed gabapentin in the past.   Venous insufficiency Hx of HCTZ use, hyponatremia associated with higher dose.   Insomnia secondary to anxiety Stable, continue Zolpidem, Lorazepam.     Family/ staff Communication: plan of care reviewed with the patient and charge nurse.   Labs/tests ordered:  CBC/diff, CMP/eGFR, uric acid, ESR, CRP  Time spend 25 minutes.

## 2020-03-18 NOTE — Assessment & Plan Note (Addendum)
Reported the patient's left foot pain, redness, warmth, swelling lateral left foot, ? Gout, no Hx of gout, hx of peripheral neuropathy, will update CBC/diff, CMP/eGFR, uric acid, ESR, CRP 03/19/20 wbc 7.9, Hgb 9.6, plt 933, neutrophils 68.6, Na 130, K 4.9, Bun 11, creat 0.61, eGFR 82, uric acid 4.4, ESR 17(<30), CRP 16(<8) 03/20/20 apply 3% diclofenac topical gel bid to the lateral left foot 5th MTJ  reddened reddened area x 2 weeks.

## 2020-03-18 NOTE — Assessment & Plan Note (Signed)
Hgb 9.2 03/07/20, continue Fe, update CBC/diff.

## 2020-03-18 NOTE — Assessment & Plan Note (Signed)
Stable, continue Zolpidem, Lorazepam.

## 2020-03-19 ENCOUNTER — Encounter: Payer: Self-pay | Admitting: Nurse Practitioner

## 2020-03-20 ENCOUNTER — Encounter: Payer: Self-pay | Admitting: Nurse Practitioner

## 2020-03-20 ENCOUNTER — Non-Acute Institutional Stay (SKILLED_NURSING_FACILITY): Payer: Medicare Other | Admitting: Nurse Practitioner

## 2020-03-20 DIAGNOSIS — I872 Venous insufficiency (chronic) (peripheral): Secondary | ICD-10-CM | POA: Diagnosis not present

## 2020-03-20 DIAGNOSIS — K5901 Slow transit constipation: Secondary | ICD-10-CM | POA: Insufficient documentation

## 2020-03-20 DIAGNOSIS — S72142A Displaced intertrochanteric fracture of left femur, initial encounter for closed fracture: Secondary | ICD-10-CM

## 2020-03-20 DIAGNOSIS — M25562 Pain in left knee: Secondary | ICD-10-CM | POA: Diagnosis not present

## 2020-03-20 DIAGNOSIS — K219 Gastro-esophageal reflux disease without esophagitis: Secondary | ICD-10-CM | POA: Diagnosis not present

## 2020-03-20 DIAGNOSIS — G8929 Other chronic pain: Secondary | ICD-10-CM

## 2020-03-20 LAB — CBC: RBC: 2.99 — AB (ref 3.87–5.11)

## 2020-03-20 LAB — BASIC METABOLIC PANEL
BUN: 11 (ref 4–21)
CO2: 98 — AB (ref 13–22)
Chloride: 98 — AB (ref 99–108)
Creatinine: 0.6 (ref 0.5–1.1)
Glucose: 94
Potassium: 4.9 (ref 3.4–5.3)
Sodium: 130 — AB (ref 137–147)

## 2020-03-20 LAB — CBC AND DIFFERENTIAL
HCT: 29 — AB (ref 36–46)
Hemoglobin: 9.6 — AB (ref 12.0–16.0)
Neutrophils Absolute: 5419
Platelets: 933 — AB (ref 150–399)
WBC: 7.9

## 2020-03-20 LAB — POCT ERYTHROCYTE SEDIMENTATION RATE, NON-AUTOMATED: Sed Rate: 17

## 2020-03-20 LAB — COMPREHENSIVE METABOLIC PANEL
Albumin: 3.3 — AB (ref 3.5–5.0)
Calcium: 9.2 (ref 8.7–10.7)
Globulin: 2.5

## 2020-03-20 LAB — HEPATIC FUNCTION PANEL
ALT: 10 (ref 7–35)
AST: 16 (ref 13–35)
Alkaline Phosphatase: 75 (ref 25–125)
Bilirubin, Total: 0.8

## 2020-03-20 NOTE — Assessment & Plan Note (Signed)
Left IM nail repair, on ASA bid, continue therapy, Tylenol, Oxycodone. No pain in the left calf palpated or with dorsiflexion of the left foot.

## 2020-03-20 NOTE — Progress Notes (Signed)
Location:   [facility] Room Number: 32 Place of Service:  SNF (31) Provider:  Faven Watterson, NP    Patient Care Team: [name], MD as PCP - General (Internal Medicine)  Extended Emergency Contact Information Primary Emergency Contact: Katherine Mantle of [place] Phone: [phone] Relation: Daughter Secondary Emergency Contact: Milon Dikes [place] of [place] Phone: [phone] Mobile Phone: [phone] Relation: Friend  Code Status:  DNR Goals of care: Advanced Directive information Advanced Directives 03/20/2020  Does Patient Have a Medical Advance Directive? Yes  Type of Advance Directive Living will;Out of facility DNR (pink MOST or yellow form)  Does patient want to make changes to medical advance directive? No - Patient declined  Copy of [place] in Chart? -  Would patient like information on creating a medical advance directive? -  Pre-existing out of facility DNR order (yellow form or pink MOST form) Pink MOST form placed in chart (order not valid for inpatient use)     Chief Complaint  Patient presents with  . Acute Visit    Left Knee Pain    HPI:  Pt is a 86 y.o. female seen today for an acute visit for c/o left knee pain, not new, due for Ortho inj tomorrow 03/21/20. No bruise or swelling or injury noted. Prn Oxycodone 5mg  q4hr available to her, she takes Tylenol 1000mg  qd for pain left hip, left knee, left foot. Minimal edema LLE, on HCTZ 12..5mg  2x/wk. No constipation, taking MiraLax qd/prn, Senokot S II bid. GERD, stable, on Omeprazole. S/p left hip IM nail repair 03/03/20.    Past Medical History:  Diagnosis Date  . Abdominal pain 02/26/2017  . Anxiety   . Arthritis    Left Knee, Wrist, Back and Hips  . Cervical vertebral fusion   . Diverticulitis   . Diverticulosis   . Fibromyalgia   . GERD (gastroesophageal reflux disease)   . Gout 02/2018   L hand  . Hypothyroidism   .  Pelvis fracture ([place])   . Peritoneal free air 03/29/2012  . Pneumonia   . Pneumoperitoneum 02/26/2017  . Thyroid disorder   . Toe pain 09/06/2016   Past Surgical History:  Procedure Laterality Date  . ABDOMINAL HYSTERECTOMY    . BOWEL RESECTION N/A 07/09/2017   Procedure: SMALL BOWEL RESECTION;  Surgeon: Johnathan Hausen, MD;  Location: [facility];  Service: General;  Laterality: N/A;  . CERVICAL FUSION     x2  . FEMUR IM NAIL Left 03/03/2020   Procedure: INTRAMEDULLARY (IM) NAIL FEMORAL;  Surgeon: Paralee Cancel, MD;  Location: [facility];  Service: Orthopedics;  Laterality: Left;  . LAPAROSCOPIC APPENDECTOMY N/A 03/04/2017   Procedure: APPENDECTOMY LAPAROSCOPIC;  Surgeon: Johnathan Hausen, MD;  Location: [facility];  Service: General;  Laterality: N/A;  . LAPAROSCOPY N/A 03/04/2017   Procedure: LAPAROSCOPY DIAGNOSTIC, ENTEROLYSIS;  Surgeon: Johnathan Hausen, MD;  Location: [facility];  Service: General;  Laterality: N/A;  . LAPAROTOMY N/A 07/09/2017   Procedure: EXPLORATORY LAPAROTOMY;  Surgeon: Johnathan Hausen, MD;  Location: [facility];  Service: General;  Laterality: N/A;  . SPINE SURGERY     Lumbar- rod placement  . TONSILLECTOMY     86y/o  . TOTAL VAGINAL HYSTERECTOMY      Allergies  Allergen Reactions  . Codeine Rash  . Penicillins Rash    Has patient had a PCN reaction causing immediate rash, facial/tongue/throat swelling, SOB or lightheadedness with hypotension: No Has patient had a PCN reaction  causing severe rash involving mucus membranes or skin necrosis: No Has patient had a PCN reaction that required hospitalization: No Has patient had a PCN reaction occurring within the last 10 years: No If all of the above answers are "NO", then may proceed with Cephalosporin use.   . Levaquin [Levofloxacin]     Allergies as of 03/20/2020      Reactions   Codeine Rash   Penicillins Rash   Has patient had a PCN reaction causing immediate rash, facial/tongue/throat swelling, SOB or lightheadedness with  hypotension: No Has patient had a PCN reaction causing severe rash involving mucus membranes or skin necrosis: No Has patient had a PCN reaction that required hospitalization: No Has patient had a PCN reaction occurring within the last 10 years: No If all of the above answers are "NO", then may proceed with Cephalosporin use.   Levaquin [levofloxacin]       Medication List       Accurate as of March 20, 2020 11:59 PM. If you have any questions, ask your nurse or doctor.        aspirin 81 MG EC tablet Take 1 tablet (81 mg total) by mouth 2 (two) times daily.   CALCIUM 500/D PO Take 500 mg by mouth daily.   ciprofloxacin 500 MG tablet Commonly known as: CIPRO Take 500 mg by mouth 2 (two) times daily.   Diclofenac Sodium 3 % Gel Apply 3 % topically in the morning and at bedtime.   ferrous sulfate 325 (65 FE) MG tablet Take 325 mg by mouth 3 (three) times a week. On Monday, Wednesday, and Friday.   HAIR SKIN NAILS PO Take by mouth daily.   hydrochlorothiazide 12.5 MG capsule Commonly known as: MICROZIDE Take 12.5 mg by mouth 2 (two) times a week. Monday & Friday.   levothyroxine 75 MCG tablet Commonly known as: SYNTHROID Take 75 mcg by mouth daily before breakfast.   LORazepam 0.5 MG tablet Commonly known as: Ativan Take 0.5 tablets (0.25 mg total) by mouth 2 (two) times daily as needed for anxiety.   melatonin 5 MG Tabs Take 5 mg by mouth at bedtime.   omeprazole 20 MG capsule Commonly known as: PRILOSEC Take 20 mg by mouth daily as needed.   OXYCODONE ER PO Take 5 mg by mouth every 4 (four) hours as needed.   polyethylene glycol 17 g packet Commonly known as: MIRALAX / GLYCOLAX Take 17 g by mouth daily as needed.   polyethylene glycol 17 g packet Commonly known as: MIRALAX / GLYCOLAX Take 17 g by mouth daily.   PREVAGEN PO Take by mouth daily.   saccharomyces boulardii 250 MG capsule Commonly known as: FLORASTOR Take 250 mg by mouth 2 (two) times  daily.   senna-docusate 8.6-50 MG tablet Commonly known as: Senokot-S Take 2 tablets by mouth 2 (two) times daily.   THERATEARS OP Place 1 drop into both eyes 2 (two) times daily.   TYLENOL EXTRA STRENGTH PO Take 500 mg by mouth daily.   acetaminophen 500 MG tablet Commonly known as: TYLENOL Take 1,000 mg by mouth daily.   Vitamin D (Cholecalciferol) 50 MCG (2000 UT) Caps Take 1 tablet by mouth daily.   zolpidem 10 MG tablet Commonly known as: AMBIEN Take 10 mg by mouth at bedtime as needed for sleep.       Review of Systems  Constitutional: Negative for activity change, appetite change, fatigue and fever.  HENT: Negative for congestion and voice change.   Eyes:  Negative for visual disturbance.  Respiratory: Negative for cough.   Cardiovascular: Positive for leg swelling.       Mostly LLE  Gastrointestinal: Negative for abdominal distention, abdominal pain and constipation.  Genitourinary: Negative for difficulty urinating, dysuria and urgency.  Musculoskeletal: Positive for arthralgias, back pain and gait problem.       Left hip pain, left lateral foot pain(5th MTJ), chronic left knee pain-due for inj 03/21/20  Skin: Negative for color change.  Neurological: Negative for speech difficulty, weakness and light-headedness.  Psychiatric/Behavioral: Positive for dysphoric mood and sleep disturbance. Negative for agitation and behavioral problems.    Immunization History  Administered Date(s) Administered  . Influenza-Unspecified 09/01/2018  . Moderna SARS-COVID-2 Vaccination 12/04/2019, 01/01/2020  . Zoster Recombinat (Shingrix) 07/05/2018   Pertinent  Health Maintenance Due  Topic Date Due  . DEXA SCAN  Never done  . PNA vac Low Risk Adult (1 of 2 - PCV13) Never done  . INFLUENZA VACCINE  06/30/2020   No flowsheet data found. Functional Status Survey:    Vitals:   03/20/20 1549  BP: 120/70  Pulse: 78  Resp: 20  Temp: 98.8 F (37.1 C)  SpO2: 96%  Weight:  98 lb 1.6 oz (44.5 kg)  Height: 5' (1.524 m)   Body mass index is 19.16 kg/m. Physical Exam Constitutional:      Appearance: Normal appearance. She is not ill-appearing.  HENT:     Head: Normocephalic and atraumatic.     Nose: Nose normal.     Mouth/Throat:     Mouth: Mucous membranes are moist.  Eyes:     Extraocular Movements: Extraocular movements intact.     Conjunctiva/sclera: Conjunctivae normal.     Pupils: Pupils are equal, round, and reactive to light.  Cardiovascular:     Rate and Rhythm: Normal rate and regular rhythm.     Heart sounds: No murmur.     Comments: Weak left DP pulse, more symptomatic tingling sensation at night.  Pulmonary:     Effort: Pulmonary effort is normal.     Breath sounds: Rales present.     Comments: Right base Abdominal:     General: Bowel sounds are normal. There is no distension.     Palpations: Abdomen is soft.     Tenderness: There is no abdominal tenderness.  Musculoskeletal:     Cervical back: Normal range of motion and neck supple.     Right lower leg: No edema.     Left lower leg: Edema present.     Comments: Trace edema LLE, mild scoliosis. Improved lateal left foot 5th MTJ region redness, pain, warmth. Left knee, no ballottement, crepitus, deformity, redness, or fever.   Skin:    General: Skin is warm and dry.     Comments: S/p L hip IM nail. Near resolution of the lateral left 5th MTJ redness, warmth, tenderness.   Neurological:     General: No focal deficit present.     Mental Status: She is alert and oriented to person, place, and time. Mental status is at baseline.     Gait: Gait abnormal.     Comments: Using walker sometimes.   Psychiatric:        Mood and Affect: Mood normal.        Behavior: Behavior normal.        Thought Content: Thought content normal.        Judgment: Judgment normal.     Labs reviewed: Recent Labs    03/02/20 2255  03/02/20 2255 03/04/20 0452 03/05/20 0437 03/08/20 0000  NA 135   < >  133* 137 133*  K 4.4   < > 4.6 4.6 4.1  CL 97*   < > 105 109 101  CO2 26   < > 22 24 27*  GLUCOSE 162*  --  124* 102*  --   BUN 20   < > 12 12 9   CREATININE 0.80   < > 0.58 0.61 0.6  CALCIUM 9.9   < > 8.2* 8.0* 8.1*   < > = values in this interval not displayed.   Recent Labs    03/08/20 0000  AST 19  ALT 6*  ALKPHOS 43  ALBUMIN 3.2*   Recent Labs    03/02/20 2255 03/02/20 2255 03/04/20 0452 03/04/20 0452 03/05/20 0437 03/08/20 0000 03/12/20 0000  WBC 15.6*   < > 8.4   < > 7.2 6.2 11.0  NEUTROABS 13.4*   < >  --   --  3.9 3,819 8,448  HGB 12.6   < > 7.7*   < > 7.0* 9.2* 10.2*  HCT 37.7   < > 23.2*   < > 21.5* 27* 31*  MCV 98.2  --  100.9*  --  100.9*  --   --   PLT 324   < > 226   < > 220 339 643*   < > = values in this interval not displayed.   Lab Results  Component Value Date   TSH 2.61 03/08/2020   Lab Results  Component Value Date   HGBA1C 5.9 (H) 03/30/2018   No results found for: CHOL, HDL, LDLCALC, LDLDIRECT, TRIG, CHOLHDL  Significant Diagnostic Results in last 30 days:  CT HEAD WO CONTRAST  Result Date: 03/02/2020 CLINICAL DATA:  Head trauma, struck in head by a potted plant causing patient to fall to ground EXAM: CT HEAD WITHOUT CONTRAST TECHNIQUE: Contiguous axial images were obtained from the base of the skull through the vertex without intravenous contrast. Sagittal and coronal MPR images reconstructed from axial data set. COMPARISON:  03/25/2012 FINDINGS: Brain: Generalized atrophy. Normal ventricular morphology. No midline shift or mass effect. Small vessel chronic ischemic changes of deep cerebral white matter. No intracranial hemorrhage, mass lesion, evidence of acute infarction, or extra-axial fluid collection. Vascular: No hyperdense vessels Skull: Intact Sinuses/Orbits: Clear Other: N/A IMPRESSION: Atrophy with minimal small vessel chronic ischemic changes of deep cerebral white matter. No acute intracranial abnormalities. Electronically Signed    By: Lavonia Dana M.D.   On: 03/02/2020 23:22   DG Chest Port 1 View  Result Date: 03/02/2020 CLINICAL DATA:  LEFT hip pain post fall EXAM: PORTABLE CHEST 1 VIEW COMPARISON:  Portable exam 2317 hours compared to 12/01/2016 FINDINGS: Normal heart size, mediastinal contours, and pulmonary vascularity. Mild scarring at RIGHT base. Lungs otherwise clear. Calcified granuloma RIGHT mid lung. No infiltrate, pleural effusion, or pneumothorax. Bones demineralized. IMPRESSION: No acute abnormalities. Mild scarring RIGHT base. Electronically Signed   By: Lavonia Dana M.D.   On: 03/02/2020 23:24   DG C-Arm 1-60 Min-No Report  Result Date: 03/03/2020 Fluoroscopy was utilized by the requesting physician.  No radiographic interpretation.   DG HIP OPERATIVE UNILAT W OR W/O PELVIS LEFT  Result Date: 03/03/2020 CLINICAL DATA:  Left hip intramedullary nail EXAM: OPERATIVE LEFT HIP (WITH PELVIS IF PERFORMED) 2 VIEWS TECHNIQUE: Fluoroscopic spot image(s) were submitted for interpretation post-operatively. COMPARISON:  None. FINDINGS: Intraoperative fluoroscopic views of the left hip demonstrating intramedullary nail fixation are  submitted for review. IMPRESSION: Intraoperative fluoroscopic views of the left hip demonstrating intramedullary nail fixation are submitted for review. Electronically Signed   By: Eddie Candle M.D.   On: 03/03/2020 14:04   DG Hip Unilat W or Wo Pelvis 2-3 Views Left  Result Date: 03/02/2020 CLINICAL DATA:  Struck in the head by a potted plant, fell to ground, LEFT hip pain EXAM: DG HIP (WITH OR WITHOUT PELVIS) 2-3V LEFT COMPARISON:  Pelvic radiograph 03/25/2012 FINDINGS: Osseous demineralization. Prior lumbosacral fusion. Narrowing of hip joints bilaterally. SI joints preserved. Comminuted mildly displaced intertrochanteric fracture LEFT femur. No dislocation. Pelvis intact. IMPRESSION: Comminuted displaced intertrochanteric fracture LEFT femur. Electronically Signed   By: Lavonia Dana M.D.   On:  03/02/2020 23:23    Assessment/Plan Left knee pain Its not new per patient, due for knee inj tomorrow, no s/s of injury or infection, will apply Diclofenac gel qid to the left knee. Observe. Prn Oxycodone is available to her. Continue Tylenol 1000mg  qd.   GERD (gastroesophageal reflux disease) Stable, continue Omeprazole.   Venous insufficiency Minimal swelling LLE, continue HCTZ 2x/wk  Slow transit constipation Stable, continue Senokot S II bid, prn MiraLax.   Displaced intertrochanteric fracture of left femur, initial encounter for closed fracture (HCC) Left IM nail repair, on ASA bid, continue therapy, Tylenol, Oxycodone. No pain in the left calf palpated or with dorsiflexion of the left foot.      Family/ staff Communication: plan of care reviewed with the patient and charge nurse.   Labs/tests ordered:  none  Time spend 25 minutes.

## 2020-03-20 NOTE — Assessment & Plan Note (Signed)
Stable, continue  Senokot S II bid, prn MiraLax.   

## 2020-03-20 NOTE — Assessment & Plan Note (Signed)
Minimal swelling LLE, continue HCTZ 2x/wk

## 2020-03-20 NOTE — Assessment & Plan Note (Signed)
Stable, continue Omeprazole.  

## 2020-03-20 NOTE — Assessment & Plan Note (Addendum)
Its not new per patient, due for knee inj tomorrow, no s/s of injury or infection, will apply Diclofenac gel qid to the left knee. Observe. Prn Oxycodone is available to her. Continue Tylenol 1000mg  qd.

## 2020-03-21 ENCOUNTER — Encounter: Payer: Self-pay | Admitting: Nurse Practitioner

## 2020-03-25 ENCOUNTER — Other Ambulatory Visit: Payer: Self-pay | Admitting: Nurse Practitioner

## 2020-03-25 ENCOUNTER — Other Ambulatory Visit: Payer: Self-pay | Admitting: *Deleted

## 2020-03-25 MED ORDER — OXYCODONE HCL 5 MG PO TABS: 5.0000 mg | ORAL_TABLET | ORAL | 0 refills | Status: DC | PRN

## 2020-03-25 NOTE — Telephone Encounter (Signed)
Received fax from [facility] and sent to Dr. Gupta for approval.  

## 2020-04-08 ENCOUNTER — Other Ambulatory Visit: Payer: Self-pay | Admitting: *Deleted

## 2020-04-08 MED ORDER — OXYCODONE HCL 5 MG PO TABS: 5.0000 mg | ORAL_TABLET | ORAL | 0 refills | Status: DC | PRN

## 2020-04-08 NOTE — Telephone Encounter (Signed)
Received fax from [facility] and sent to Dr. Gupta for approval.  

## 2020-04-12 ENCOUNTER — Other Ambulatory Visit: Payer: Self-pay | Admitting: *Deleted

## 2020-04-12 MED ORDER — ZOLPIDEM TARTRATE 10 MG PO TABS: 10.0000 mg | ORAL_TABLET | Freq: Every evening | ORAL | 0 refills | Status: DC | PRN

## 2020-04-12 NOTE — Telephone Encounter (Signed)
Received fax from [facility] and sent to Dr. Gupta for approval.  

## 2020-04-15 ENCOUNTER — Non-Acute Institutional Stay: Payer: Medicare Other | Admitting: Nurse Practitioner

## 2020-04-15 ENCOUNTER — Encounter: Payer: Self-pay | Admitting: Nurse Practitioner

## 2020-04-15 DIAGNOSIS — F419 Anxiety disorder, unspecified: Secondary | ICD-10-CM

## 2020-04-15 DIAGNOSIS — D500 Iron deficiency anemia secondary to blood loss (chronic): Secondary | ICD-10-CM | POA: Diagnosis not present

## 2020-04-15 DIAGNOSIS — E032 Hypothyroidism due to medicaments and other exogenous substances: Secondary | ICD-10-CM

## 2020-04-15 DIAGNOSIS — F5105 Insomnia due to other mental disorder: Secondary | ICD-10-CM

## 2020-04-15 DIAGNOSIS — M25552 Pain in left hip: Secondary | ICD-10-CM | POA: Insufficient documentation

## 2020-04-15 DIAGNOSIS — E871 Hypo-osmolality and hyponatremia: Secondary | ICD-10-CM | POA: Diagnosis not present

## 2020-04-15 DIAGNOSIS — I872 Venous insufficiency (chronic) (peripheral): Secondary | ICD-10-CM

## 2020-04-15 DIAGNOSIS — K5901 Slow transit constipation: Secondary | ICD-10-CM

## 2020-04-15 NOTE — Assessment & Plan Note (Signed)
Continue prn Lorazepam, Zolpidem.

## 2020-04-15 NOTE — Assessment & Plan Note (Signed)
Stable, continue MiraLax, Senokot S 

## 2020-04-15 NOTE — Assessment & Plan Note (Signed)
Will update CMP/eGFR

## 2020-04-15 NOTE — Assessment & Plan Note (Signed)
With walking/weight bearing, pain travels to the left groin, leg, will X-ray left hip/pelvis. Continue Tylenol, Oxycodone for pain.

## 2020-04-15 NOTE — Assessment & Plan Note (Signed)
Stable, continue Levothyroxine 71mcg qd, update TSH

## 2020-04-15 NOTE — Progress Notes (Signed)
Location:   [facility] Room Number: 161 Place of Service:  AL[facility] (13) Provider: Lennie Odor Alohilani Levenhagen NP  [name], MD  Patient Care Team: [name], MD as PCP - General (Internal Medicine)  Extended Emergency Contact Information Primary Emergency Contact: Katherine Mantle of [place] Phone: [phone] Relation: Daughter Secondary Emergency Contact: Milon Dikes [place] of [place] Phone: [phone] Mobile Phone: [phone] Relation: Friend  Code Status: DNR Goals of care: Advanced Directive information Advanced Directives 04/15/2020  Does Patient Have a Medical Advance Directive? Yes  Type of Advance Directive Living will;Healthcare Power of Attorney  Does patient want to make changes to medical advance directive? No - Patient declined  Copy of [place] in Chart? -  Would patient like information on creating a medical advance directive? -  Pre-existing out of facility DNR order (yellow form or pink MOST form) Pink MOST form placed in chart (order not valid for inpatient use)     Chief Complaint  Patient presents with  . Acute Visit    anxiety, left hip pain.    HPI:  Pt is a 86 y.o. female seen today for an acute visit for anxiety, prn Lorazepam, Zolpidem 81m hs prn needed. Left hip pain, s/p surgical repair, f/u Ortho one week, the patient stated pain travels to her groin, thigh, and leg, no better, taking Tylenol 5052mqd, 100057md, prn Oxycodone. Constipation, stable, on MIraLax qd, Senokot S II bid. Anemia, stable, on Fe. Edema BLE, controlled on HCTZ 12.5mg22m/wk. Stated her Levothyroxine 75mc31ms been later in am, desires checking TSH level.    Past Medical History:  Diagnosis Date  . Abdominal pain 02/26/2017  . Anxiety   . Arthritis    Left Knee, Wrist, Back and Hips  . Cervical vertebral fusion   . Diverticulitis   . Diverticulosis   . Fibromyalgia   . GERD (gastroesophageal reflux disease)     . Gout 02/2018   L hand  . Hypothyroidism   . Pelvis fracture (HCC) [place] Peritoneal free air 03/29/2012  . Pneumonia   . Pneumoperitoneum 02/26/2017  . Thyroid disorder   . Toe pain 09/06/2016   Past Surgical History:  Procedure Laterality Date  . ABDOMINAL HYSTERECTOMY    . BOWEL RESECTION N/A 07/09/2017   Procedure: SMALL BOWEL RESECTION;  Surgeon: MartiJohnathan Hausen  Location: [facility];  Service: General;  Laterality: N/A;  . CERVICAL FUSION     x2  . FEMUR IM NAIL Left 03/03/2020   Procedure: INTRAMEDULLARY (IM) NAIL FEMORAL;  Surgeon: Olin,Paralee Cancel  Location: [facility];  Service: Orthopedics;  Laterality: Left;  . LAPAROSCOPIC APPENDECTOMY N/A 03/04/2017   Procedure: APPENDECTOMY LAPAROSCOPIC;  Surgeon: MatthJohnathan Hausen  [facility];  Service: General;  Laterality: N/A;  . LAPAROSCOPY N/A 03/04/2017   Procedure: LAPAROSCOPY DIAGNOSTIC, ENTEROLYSIS;  Surgeon: MatthJohnathan Hausen  [facility];  Service: General;  Laterality: N/A;  . LAPAROTOMY N/A 07/09/2017   Procedure: EXPLORATORY LAPAROTOMY;  Surgeon: MartiJohnathan Hausen  [facility];  Service: General;  Laterality: N/A;  . SPINE SURGERY     Lumbar- rod placement  . TONSILLECTOMY     86y/o  . TOTAL VAGINAL HYSTERECTOMY      Allergies  Allergen Reactions  . Codeine Rash  . Penicillins Rash    Has patient had a PCN reaction causing immediate rash, facial/tongue/throat swelling, SOB or lightheadedness with hypotension: No Has patient had  a PCN reaction causing severe rash involving mucus membranes or skin necrosis: No Has patient had a PCN reaction that required hospitalization: No Has patient had a PCN reaction occurring within the last 10 years: No If all of the above answers are "NO", then may proceed with Cephalosporin use.   . Levaquin [Levofloxacin]     Allergies as of 04/15/2020      Reactions   Codeine Rash   Penicillins Rash   Has patient had a PCN reaction causing immediate rash,  facial/tongue/throat swelling, SOB or lightheadedness with hypotension: No Has patient had a PCN reaction causing severe rash involving mucus membranes or skin necrosis: No Has patient had a PCN reaction that required hospitalization: No Has patient had a PCN reaction occurring within the last 10 years: No If all of the above answers are "NO", then may proceed with Cephalosporin use.   Levaquin [levofloxacin]       Medication List       Accurate as of Apr 15, 2020 11:59 PM. If you have any questions, ask your nurse or doctor.        STOP taking these medications   ciprofloxacin 500 MG tablet Commonly known as: CIPRO Stopped by: Nitara Szczerba X Shariah Assad, NP   HAIR SKIN NAILS PO Stopped by: Arielys Wandersee X Francine Hannan, NP   OXYCODONE ER PO Stopped by: Denya Buckingham X Ab Leaming, NP     TAKE these medications   aspirin 81 MG EC tablet Take 1 tablet (81 mg total) by mouth 2 (two) times daily.   CALCIUM 500/D PO Take 500 mg by mouth daily.   Diclofenac Sodium 3 % Gel Apply 3 % topically in the morning and at bedtime.   ferrous sulfate 325 (65 FE) MG tablet Take 325 mg by mouth 3 (three) times a week. On Monday, Wednesday, and Friday.   hydrochlorothiazide 12.5 MG capsule Commonly known as: MICROZIDE Take 12.5 mg by mouth 2 (two) times a week. Monday & Friday.   ibandronate 150 MG tablet Commonly known as: BONIVA Take 150 mg by mouth every 30 (thirty) days. Take in the morning with a full glass of water, on an empty stomach, and do not take anything else by mouth or lie down for the next 30 min.   levothyroxine 75 MCG tablet Commonly known as: SYNTHROID Take 75 mcg by mouth daily before breakfast.   LORazepam 0.5 MG tablet Commonly known as: Ativan Take 0.5 tablets (0.25 mg total) by mouth 2 (two) times daily as needed for anxiety.   melatonin 5 MG Tabs Take 5 mg by mouth at bedtime.   omeprazole 20 MG capsule Commonly known as: PRILOSEC Take 20 mg by mouth daily as needed.   oxyCODONE 5 MG immediate  release tablet Commonly known as: Roxicodone Take 1 tablet (5 mg total) by mouth every 4 (four) hours as needed.   polyethylene glycol 17 g packet Commonly known as: MIRALAX / GLYCOLAX Take 17 g by mouth daily as needed.   polyethylene glycol 17 g packet Commonly known as: MIRALAX / GLYCOLAX Take 17 g by mouth daily.   PREVAGEN PO Take by mouth daily.   saccharomyces boulardii 250 MG capsule Commonly known as: FLORASTOR Take 250 mg by mouth 2 (two) times daily.   senna-docusate 8.6-50 MG tablet Commonly known as: Senokot-S Take 2 tablets by mouth 2 (two) times daily.   THERATEARS OP Place 1 drop into both eyes 2 (two) times daily.   TYLENOL EXTRA STRENGTH PO Take 500 mg by mouth.  2 tabs daily   acetaminophen 500 MG tablet Commonly known as: TYLENOL Take 1,000 mg by mouth every 4 (four) hours as needed.   Vitamin D (Cholecalciferol) 50 MCG (2000 UT) Caps Take 1 tablet by mouth daily.   zolpidem 10 MG tablet Commonly known as: AMBIEN Take 1 tablet (10 mg total) by mouth at bedtime as needed for sleep.       Review of Systems  Constitutional: Negative for activity change, appetite change, fatigue and fever.  HENT: Negative for congestion and voice change.   Eyes: Negative for visual disturbance.  Respiratory: Negative for cough.   Cardiovascular: Positive for leg swelling.       Mostly LLE  Gastrointestinal: Negative for abdominal distention, abdominal pain and constipation.  Genitourinary: Negative for difficulty urinating, dysuria and urgency.  Musculoskeletal: Positive for arthralgias, back pain and gait problem.       Left hip pain, left groin, left thigh, chronic left knee pain-due for inj 03/21/20  Skin: Negative for color change.  Neurological: Negative for dizziness, speech difficulty, weakness and headaches.  Psychiatric/Behavioral: Positive for dysphoric mood and sleep disturbance. Negative for agitation and behavioral problems.    Immunization History   Administered Date(s) Administered  . Influenza-Unspecified 09/01/2018  . Moderna SARS-COVID-2 Vaccination 12/04/2019, 01/01/2020  . Zoster Recombinat (Shingrix) 07/05/2018   Pertinent  Health Maintenance Due  Topic Date Due  . DEXA SCAN  Never done  . PNA vac Low Risk Adult (1 of 2 - PCV13) Never done  . INFLUENZA VACCINE  06/30/2020   No flowsheet data found. Functional Status Survey:    Vitals:   04/15/20 1537  BP: (!) 142/92  Pulse: 86  Resp: 18  Temp: (!) 97.1 F (36.2 C)  SpO2: 94%  Weight: 92 lb 3.2 oz (41.8 kg)  Height: 5' (1.524 m)   Body mass index is 18.01 kg/m. Physical Exam Constitutional:      Appearance: Normal appearance.  HENT:     Head: Normocephalic and atraumatic.     Mouth/Throat:     Mouth: Mucous membranes are moist.  Eyes:     Extraocular Movements: Extraocular movements intact.     Conjunctiva/sclera: Conjunctivae normal.     Pupils: Pupils are equal, round, and reactive to light.  Cardiovascular:     Rate and Rhythm: Normal rate and regular rhythm.     Heart sounds: No murmur.     Comments: Weak left DP pulse, more symptomatic tingling sensation at night.  Pulmonary:     Effort: Pulmonary effort is normal.     Breath sounds: Rales present.     Comments: Right base Abdominal:     General: Bowel sounds are normal.     Palpations: Abdomen is soft.     Tenderness: There is no abdominal tenderness.  Musculoskeletal:     Cervical back: Normal range of motion and neck supple.     Right lower leg: No edema.     Left lower leg: Edema present.     Comments: Trace edema LLE, mild scoliosis. Left hip/groin/thigh/knee pain when walking/weight bearing  Skin:    General: Skin is warm and dry.     Comments: S/p L hip IM nail.  Neurological:     General: No focal deficit present.     Mental Status: She is alert and oriented to person, place, and time. Mental status is at baseline.     Gait: Gait abnormal.     Comments: Using walker sometimes.  Psychiatric:        Mood and Affect: Mood normal.        Behavior: Behavior normal.        Thought Content: Thought content normal.        Judgment: Judgment normal.     Labs reviewed: Recent Labs    03/02/20 2255 03/02/20 2255 03/04/20 0452 03/05/20 0437 03/08/20 0000  NA 135   < > 133* 137 133*  K 4.4   < > 4.6 4.6 4.1  CL 97*   < > 105 109 101  CO2 26   < > 22 24 27*  GLUCOSE 162*  --  124* 102*  --   BUN 20   < > _0 CREATININE 0.80   < > 0.58 0.61 0.6  CALCIUM 9.9   < > 8.2* 8.0* 8.1*   < > = values in this interval not displayed.   Recent Labs    03/08/20 0000  AST 19  ALT 6*  ALKPHOS 43  ALBUMIN 3.2*   Recent Labs    03/02/20 2255 03/02/20 2255 03/04/20 0452 03/04/20 0452 03/05/20 0437 03/08/20 0000 03/12/20 0000  WBC 15.6*   < > 8.4   < > 7.2 6.2 11.0  NEUTROABS 13.4*   < >  --   --  3.9 3,819 8,448  HGB 12.6   < > 7.7*   < > 7.0* 9.2* 10.2*  HCT 37.7   < > 23.2*   < > 21.5* 27* 31*  MCV 98.2  --  100.9*  --  100.9*  --   --   PLT 324   < > 226   < > 220 339 643*   < > = values in this interval not displayed.   Lab Results  Component Value Date   TSH 2.61 03/08/2020   Lab Results  Component Value Date   HGBA1C 5.9 (H) 03/30/2018   No results found for: CHOL, HDL, LDLCALC, LDLDIRECT, TRIG, CHOLHDL  Significant Diagnostic Results in last 30 days:  No results found.  Assessment/Plan: Insomnia secondary to anxiety Continue prn Lorazepam, Zolpidem.   Left hip pain With walking/weight bearing, pain travels to the left groin, leg, will X-ray left hip/pelvis. Continue Tylenol, Oxycodone for pain.   Blood loss anemia Hgb 9.2 03/07/20, continue Fe, update CBC/diff.   Hyponatremia Will update CMP/eGFR  Hypothyroidism Stable, continue Levothyroxine 20mg qd, update TSH  Slow transit constipation Stable, continue MiraLax, Senokot S  Venous insufficiency Trace edema BLE, continue HCTZ 2x/wk.     Family/ staff Communication: plan  of care reviewed with the patient and charge nurse.   Labs/tests ordered:  CBC/diff, CMP/eGFR, TSH, X-ray Left hp/pelvis  Time spend 40 minutes.

## 2020-04-15 NOTE — Assessment & Plan Note (Signed)
Hgb 9.2 03/07/20, continue Fe, update CBC/diff.

## 2020-04-15 NOTE — Assessment & Plan Note (Signed)
Trace edema BLE, continue HCTZ 2x/wk.

## 2020-04-16 ENCOUNTER — Encounter: Payer: Self-pay | Admitting: Nurse Practitioner

## 2020-04-17 ENCOUNTER — Other Ambulatory Visit: Payer: Self-pay | Admitting: *Deleted

## 2020-04-17 LAB — CBC AND DIFFERENTIAL
HCT: 38 (ref 36–46)
Hemoglobin: 12.8 (ref 12.0–16.0)
Platelets: 470 — AB (ref 150–399)
WBC: 6.6

## 2020-04-17 LAB — HEPATIC FUNCTION PANEL
ALT: 10 (ref 7–35)
AST: 18 (ref 13–35)
Alkaline Phosphatase: 72 (ref 25–125)
Bilirubin, Total: 0.7

## 2020-04-17 LAB — BASIC METABOLIC PANEL
BUN: 10 (ref 4–21)
CO2: 27 — AB (ref 13–22)
Chloride: 96 — AB (ref 99–108)
Creatinine: 0.9 (ref 0.5–1.1)
Glucose: 117
Potassium: 4.5 (ref 3.4–5.3)
Sodium: 130 — AB (ref 137–147)

## 2020-04-17 LAB — COMPREHENSIVE METABOLIC PANEL
Albumin: 3.7 (ref 3.5–5.0)
Calcium: 9.5 (ref 8.7–10.7)
Globulin: 2.6

## 2020-04-17 LAB — TSH: TSH: 1.8 (ref 0.41–5.90)

## 2020-04-17 LAB — CBC: RBC: 3.93 (ref 3.87–5.11)

## 2020-04-17 MED ORDER — ZOLPIDEM TARTRATE 10 MG PO TABS: 10.0000 mg | ORAL_TABLET | Freq: Every evening | ORAL | 0 refills | Status: DC | PRN

## 2020-04-17 NOTE — Telephone Encounter (Signed)
Received refill Request from [facility] and sent to Dr. Gupta for approval.  

## 2020-04-18 ENCOUNTER — Encounter: Payer: Self-pay | Admitting: Internal Medicine

## 2020-04-18 ENCOUNTER — Telehealth: Payer: Self-pay

## 2020-04-18 LAB — C-REACTIVE PROTEIN
CRP: 16
Uric Acid: 4.4

## 2020-04-18 NOTE — Progress Notes (Signed)
A user error has taken place.

## 2020-04-18 NOTE — Telephone Encounter (Signed)
Patient is regretful in her decision to leave without you order and wants to speak with you. She can be reached at [phone]

## 2020-04-18 NOTE — Telephone Encounter (Signed)
Can you call her tomorrow and make her Appointment to follow with Me or Manxi in the clinic ?

## 2020-04-18 NOTE — Telephone Encounter (Signed)
Patient scheduled.

## 2020-04-26 ENCOUNTER — Non-Acute Institutional Stay: Payer: Medicare Other | Admitting: Internal Medicine

## 2020-04-26 ENCOUNTER — Encounter: Payer: Self-pay | Admitting: Internal Medicine

## 2020-04-26 ENCOUNTER — Other Ambulatory Visit: Payer: Self-pay

## 2020-04-26 VITALS — BP 126/70 | HR 100 | Temp 96.9°F | Ht 60.0 in | Wt 98.2 lb

## 2020-04-26 DIAGNOSIS — M25552 Pain in left hip: Secondary | ICD-10-CM | POA: Diagnosis not present

## 2020-04-26 DIAGNOSIS — D500 Iron deficiency anemia secondary to blood loss (chronic): Secondary | ICD-10-CM | POA: Diagnosis not present

## 2020-04-26 DIAGNOSIS — E871 Hypo-osmolality and hyponatremia: Secondary | ICD-10-CM

## 2020-04-26 DIAGNOSIS — F5105 Insomnia due to other mental disorder: Secondary | ICD-10-CM

## 2020-04-26 DIAGNOSIS — E032 Hypothyroidism due to medicaments and other exogenous substances: Secondary | ICD-10-CM | POA: Diagnosis not present

## 2020-04-26 DIAGNOSIS — F419 Anxiety disorder, unspecified: Secondary | ICD-10-CM

## 2020-04-26 DIAGNOSIS — M8000XS Age-related osteoporosis with current pathological fracture, unspecified site, sequela: Secondary | ICD-10-CM

## 2020-04-26 DIAGNOSIS — R7303 Prediabetes: Secondary | ICD-10-CM

## 2020-04-26 MED ORDER — TRAZODONE HCL 50 MG PO TABS: 25.0000 mg | ORAL_TABLET | Freq: Every day | ORAL | 1 refills | Status: DC

## 2020-04-26 NOTE — Progress Notes (Signed)
Location: [place] [facility] Service:  Clinic (12)  Provider:   Code Status:  Goals of Care:  Advanced Directives 04/15/2020  Does Patient Have a Medical Advance Directive? Yes  Type of Advance Directive Living will;Healthcare Power of Attorney  Does patient want to make changes to medical advance directive? No - Patient declined  Copy of [place] in Chart? -  Would patient like information on creating a medical advance directive? -  Pre-existing out of facility DNR order (yellow form or pink MOST form) Pink MOST form placed in chart (order not valid for inpatient use)     Chief Complaint  Patient presents with  . Medical Management of Chronic Issues    Patient returns to clinic to continue with discharge. She would like to discuss her medications.     HPI: Patient is a 86 y.o. female seen today for an acute visit for Follow up after her discharge from [facility] [place] Was in the hospital from 4/3 -4/6 for Left Hip Fracture  She has history of Hypothyroidism, Osteoporosis, Arthritis and GERD and Depression and Anxiety also Insomnia  S/p left impaired trochanteric fracture Underwent IM nail placement on 0404 Is doing well still working with therapy pain seems to be controlled.  Now is complete weightbearing.  Has reduced her oxycodone to once a day. Anemia Was on iron.  Stopped herself due to side effects last hemoglobin is normal Had UTI in the facility symptoms now resolved Anxiety depression and insomnia This seems to be the major problem today. Patient states that she has been having increased anxiety since her fall .Also c/o Mental Fogginess  Lives by herself in her Apartment Walking with her walker. No Falls recently Past Medical History:  Diagnosis Date  . Abdominal pain 02/26/2017  . Anxiety   . Arthritis    Left Knee, Wrist, Back and Hips  . Cervical vertebral fusion   . Diverticulitis   . Diverticulosis   . Fibromyalgia   . GERD  (gastroesophageal reflux disease)   . Gout 02/2018   L hand  . Hypothyroidism   . Pelvis fracture ([place])   . Peritoneal free air 03/29/2012  . Pneumonia   . Pneumoperitoneum 02/26/2017  . Thyroid disorder   . Toe pain 09/06/2016    Past Surgical History:  Procedure Laterality Date  . ABDOMINAL HYSTERECTOMY    . BOWEL RESECTION N/A 07/09/2017   Procedure: SMALL BOWEL RESECTION;  Surgeon: Johnathan Hausen, MD;  [facility];  Service: General;  Laterality: N/A;  . CERVICAL FUSION     x2  . FEMUR IM NAIL Left 03/03/2020   Procedure: INTRAMEDULLARY (IM) NAIL FEMORAL;  Surgeon: Paralee Cancel, MD;  [facility];  Service: Orthopedics;  Laterality: Left;  . LAPAROSCOPIC APPENDECTOMY N/A 03/04/2017   Procedure: APPENDECTOMY LAPAROSCOPIC;  Surgeon: Johnathan Hausen, MD;  [facility];  Service: General;  Laterality: N/A;  . LAPAROSCOPY N/A 03/04/2017   Procedure: LAPAROSCOPY DIAGNOSTIC, ENTEROLYSIS;  Surgeon: Johnathan Hausen, MD;  [facility];  Service: General;  Laterality: N/A;  . LAPAROTOMY N/A 07/09/2017   Procedure: EXPLORATORY LAPAROTOMY;  Surgeon: Johnathan Hausen, MD;  [facility];  Service: General;  Laterality: N/A;  . SPINE SURGERY     Lumbar- rod placement  . TONSILLECTOMY     86y/o  . TOTAL VAGINAL HYSTERECTOMY      Allergies  Allergen Reactions  . Codeine Rash  . Penicillins Rash    Has patient had a PCN  reaction causing immediate rash, facial/tongue/throat swelling, SOB or lightheadedness with hypotension: No Has patient had a PCN reaction causing severe rash involving mucus membranes or skin necrosis: No Has patient had a PCN reaction that required hospitalization: No Has patient had a PCN reaction occurring within the last 10 years: No If all of the above answers are "NO", then may proceed with Cephalosporin use.   [place] [Levofloxacin]     Outpatient Encounter Medications as of 04/26/2020  Medication Sig  . Acetaminophen (TYLENOL EXTRA STRENGTH PO)  Take 500 mg by mouth. 2 tabs daily  . Calcium Carbonate-Vitamin D (CALCIUM 500/D PO) Take 500 mg by mouth daily.  . Carboxymethylcellulose Sodium (THERATEARS OP) Place 1 drop into both eyes 2 (two) times daily.   . diclofenac Sodium (VOLTAREN) 1 % GEL Apply topically 4 (four) times daily. To knee  . hydrochlorothiazide (MICROZIDE) 12.5 MG capsule Take 12.5 mg by mouth 2 (two) times a week. Monday & Friday.  . ibandronate (BONIVA) 150 MG tablet Take 150 mg by mouth every 30 (thirty) days. Take in the morning with a full glass of water, on an empty stomach, and do not take anything else by mouth or lie down for the next 30 min.  Marland Kitchen levothyroxine (SYNTHROID, LEVOTHROID) 75 MCG tablet Take 75 mcg by mouth daily before breakfast.   . LORazepam (ATIVAN) 0.5 MG tablet Take 0.5 tablets (0.25 mg total) by mouth 2 (two) times daily as needed for anxiety.  . Melatonin 5 MG TABS Take 5 mg by mouth at bedtime.  Marland Kitchen omeprazole (PRILOSEC) 20 MG capsule Take 20 mg by mouth daily as needed.   Marland Kitchen oxyCODONE (ROXICODONE) 5 MG immediate release tablet Take 1 tablet (5 mg total) by mouth every 4 (four) hours as needed. (Patient taking differently: Take 2.5 mg by mouth every morning. )  . polyethylene glycol (MIRALAX / GLYCOLAX) 17 g packet Take 17 g by mouth daily as needed.  . polyethylene glycol (MIRALAX / GLYCOLAX) 17 g packet Take 17 g by mouth daily.  Marland Kitchen saccharomyces boulardii (FLORASTOR) 250 MG capsule Take 250 mg by mouth daily.   Marland Kitchen senna-docusate (SENOKOT-S) 8.6-50 MG tablet Take 2 tablets by mouth 2 (two) times daily.  . Vitamin D, Cholecalciferol, 50 MCG (2000 UT) CAPS Take 1 tablet by mouth daily.  Marland Kitchen zolpidem (AMBIEN) 10 MG tablet Take 1 tablet (10 mg total) by mouth at bedtime as needed for sleep.  . [DISCONTINUED] acetaminophen (TYLENOL) 500 MG tablet Take 1,000 mg by mouth every 4 (four) hours as needed.   Marland Kitchen Apoaequorin (PREVAGEN PO) Take 10 mg by mouth daily.   . traZODone (DESYREL) 50 MG tablet Take 0.5  tablets (25 mg total) by mouth at bedtime.  . [DISCONTINUED] aspirin EC 81 MG EC tablet Take 1 tablet (81 mg total) by mouth 2 (two) times daily.  . [DISCONTINUED] ferrous sulfate 325 (65 FE) MG tablet Take 325 mg by mouth 3 (three) times a week. On Monday, Wednesday, and Friday.    No facility-administered encounter medications on file as of 04/26/2020.    Review of Systems:  Review of Systems  Constitutional: Positive for activity change and appetite change.  HENT: Negative.   Respiratory: Negative.   Cardiovascular: Positive for leg swelling.  Gastrointestinal: Negative.   Genitourinary: Negative.   Musculoskeletal: Positive for gait problem.  Skin: Negative.   Neurological: Positive for weakness.  Psychiatric/Behavioral: Positive for dysphoric mood and sleep disturbance. The patient is nervous/anxious.     Health Maintenance  Topic Date Due  . TETANUS/TDAP  Never done  . DEXA SCAN  Never done  . PNA vac Low Risk Adult (1 of 2 - PCV13) Never done  . INFLUENZA VACCINE  06/30/2020  . COVID-19 Vaccine  Completed    Physical Exam: Vitals:   04/26/20 1013  BP: 126/70  Pulse: 100  Temp: (!) 96.9 F (36.1 C)  SpO2: 97%  Weight: 98 lb 3.2 oz (44.5 kg)  Height: 5' (1.524 m)   Body mass index is 19.18 kg/m. Physical Exam Constitutional: Oriented to person, place, and time. Well-developed and well-nourished.  HENT:  Head: Normocephalic.  Mouth/Throat: Oropharynx is clear and moist.  Eyes: Pupils are equal, round, and reactive to light.  Neck: Neck supple.  Cardiovascular: Normal rate and normal heart sounds.  No murmur heard. Pulmonary/Chest: Effort normal and breath sounds normal. No respiratory distress. No wheezes. She has no rales.  Abdominal: Soft. Bowel sounds are normal. No distension. There is no tenderness. There is no rebound.  Musculoskeletal: Mild edema Bilateral Lymphadenopathy: none Neurological: Alert and oriented to person, place, and time.  Doing very  well with the walker Skin: Skin is warm and dry.  Psychiatric: Very Anxious and depressed Labs reviewed: Basic Metabolic Panel: Recent Labs    03/02/20 2255 03/02/20 2255 03/04/20 0452 03/04/20 0452 03/05/20 0437 03/05/20 0437 03/08/20 0000 03/20/20 0000 04/17/20 0000  NA 135   < > 133*   < > 137  --  133* 130* 130*  K 4.4   < > 4.6   < > 4.6   < > 4.1 4.9 4.5  CL 97*   < > 105   < > 109   < > 101 98* 96*  CO2 26   < > 22   < > 24   < > 27* 98* 27*  GLUCOSE 162*  --  124*  --  102*  --   --   --   --   BUN 20   < > 12   < > 12  --  9 11 10   CREATININE 0.80   < > 0.58   < > 0.61  --  0.6 0.6 0.9  CALCIUM 9.9   < > 8.2*   < > 8.0*   < > 8.1* 9.2 9.5  TSH  --   --   --   --   --   --  2.61  --  1.80   < > = values in this interval not displayed.   Liver Function Tests: Recent Labs    03/08/20 0000 03/20/20 0000 04/17/20 0000  AST 19 16 18   ALT 6* 10 10  ALKPHOS 43 75 72  ALBUMIN 3.2* 3.3* 3.7   No results for input(s): LIPASE, AMYLASE in the last 8760 hours. No results for input(s): AMMONIA in the last 8760 hours. CBC: Recent Labs    03/02/20 2255 03/02/20 2255 03/04/20 0452 03/04/20 0452 03/05/20 0437 03/05/20 0437 03/08/20 0000 03/08/20 0000 03/12/20 0000 03/20/20 0000 04/17/20 0000  WBC 15.6*   < > 8.4   < > 7.2  --  6.2   < > 11.0 7.9 6.6  NEUTROABS 13.4*   < >  --   --  3.9  --  3,819  --  8,448 5,419  --   HGB 12.6   < > 7.7*   < > 7.0*   < > 9.2*   < > 10.2* 9.6* 12.8  HCT 37.7   < >  23.2*   < > 21.5*   < > 27*   < > 31* 29* 38  MCV 98.2  --  100.9*  --  100.9*  --   --   --   --   --   --   PLT 324   < > 226   < > 220   < > 339   < > 643* 933* 470*   < > = values in this interval not displayed.   Lipid Panel: No results for input(s): CHOL, HDL, LDLCALC, TRIG, CHOLHDL, LDLDIRECT in the last 8760 hours. Lab Results  Component Value Date   HGBA1C 5.9 (H) 03/30/2018    Procedures since last visit: No results found.  Assessment/Plan Left hip  pain s/p Hip Surgery WBAT Working with therapy Taking Oxycodone 2.5 mg QD Can take Tylenol 650 one tablet 3/day PRN for pain Blood loss anemia Iron stopped Hgb normal now Hyponatremia Sodium 130 Will repeat in 4 weeks Hypothyroidism due to medication TSH normal Age-related osteoporosis with current pathological fracture, sequela On Boniva Q monthly Anxiety Can continue 0.25 mg Ativan Q am LE edema Cannot increase the dose due to hyponatremia Insomnia secondary to anxiety Will start her on Trazadone 25 mg QHS Cannot use SSRi due to Hyponatremia Discontinue Melatonin not effective Change Ambien to 2.5 mg QHS PRn  Memory deficit MMSE in 11/2017 was 30/30 Takes Prevagen Labs/tests ordered:  * No order type specified * Next appt:  06/11/2020

## 2020-04-26 NOTE — Patient Instructions (Signed)
Trazodone 25 mg at night.  Ambien 2.5 mg as needed.  Ativan 0.25mg  each morning as needed.  Tylenol 500 mg 2 tabs 3 times as needed.

## 2020-06-11 ENCOUNTER — Other Ambulatory Visit: Payer: Self-pay

## 2020-06-11 DIAGNOSIS — M8000XS Age-related osteoporosis with current pathological fracture, unspecified site, sequela: Secondary | ICD-10-CM

## 2020-06-11 DIAGNOSIS — R7303 Prediabetes: Secondary | ICD-10-CM

## 2020-06-11 DIAGNOSIS — E871 Hypo-osmolality and hyponatremia: Secondary | ICD-10-CM

## 2020-06-14 ENCOUNTER — Non-Acute Institutional Stay: Payer: Medicare Other | Admitting: Internal Medicine

## 2020-06-14 ENCOUNTER — Encounter: Payer: Self-pay | Admitting: Internal Medicine

## 2020-06-14 ENCOUNTER — Other Ambulatory Visit: Payer: Self-pay

## 2020-06-14 VITALS — BP 108/60 | HR 90 | Temp 97.8°F | Ht 60.0 in | Wt 95.6 lb

## 2020-06-14 DIAGNOSIS — F5105 Insomnia due to other mental disorder: Secondary | ICD-10-CM

## 2020-06-14 DIAGNOSIS — M8000XS Age-related osteoporosis with current pathological fracture, unspecified site, sequela: Secondary | ICD-10-CM

## 2020-06-14 DIAGNOSIS — D500 Iron deficiency anemia secondary to blood loss (chronic): Secondary | ICD-10-CM | POA: Diagnosis not present

## 2020-06-14 DIAGNOSIS — E032 Hypothyroidism due to medicaments and other exogenous substances: Secondary | ICD-10-CM

## 2020-06-14 DIAGNOSIS — E871 Hypo-osmolality and hyponatremia: Secondary | ICD-10-CM | POA: Diagnosis not present

## 2020-06-14 DIAGNOSIS — M25552 Pain in left hip: Secondary | ICD-10-CM

## 2020-06-14 DIAGNOSIS — F419 Anxiety disorder, unspecified: Secondary | ICD-10-CM

## 2020-06-14 LAB — COMPLETE METABOLIC PANEL WITH GFR
AG Ratio: 1.8 (calc) (ref 1.0–2.5)
ALT: 13 U/L (ref 6–29)
AST: 19 U/L (ref 10–35)
Albumin: 4.2 g/dL (ref 3.6–5.1)
Alkaline phosphatase (APISO): 54 U/L (ref 37–153)
BUN: 20 mg/dL (ref 7–25)
CO2: 26 mmol/L (ref 20–32)
Calcium: 9.5 mg/dL (ref 8.6–10.4)
Chloride: 100 mmol/L (ref 98–110)
Creat: 0.67 mg/dL (ref 0.60–0.88)
GFR, Est African American: 92 mL/min/{1.73_m2} (ref >=60)
GFR, Est Non African American: 79 mL/min/{1.73_m2} (ref >=60)
Globulin: 2.4 g/dL (calc) (ref 1.9–3.7)
Glucose, Bld: 87 mg/dL (ref 65–99)
Potassium: 4.5 mmol/L (ref 3.5–5.3)
Sodium: 132 mmol/L — ABNORMAL LOW (ref 135–146)
Total Bilirubin: 0.6 mg/dL (ref 0.2–1.2)
Total Protein: 6.6 g/dL (ref 6.1–8.1)

## 2020-06-14 LAB — HEMOGLOBIN A1C
Hgb A1c MFr Bld: 5.9 % of total Hgb — ABNORMAL HIGH (ref <5.7)
Mean Plasma Glucose: 123 (calc)
eAG (mmol/L): 6.8 (calc)

## 2020-06-14 LAB — LIPID PANEL
Cholesterol: 181 mg/dL (ref <200)
HDL: 67 mg/dL (ref >=50)
LDL Cholesterol (Calc): 93 mg/dL (calc)
Non-HDL Cholesterol (Calc): 114 mg/dL (calc) (ref <130)
Total CHOL/HDL Ratio: 2.7 (calc) (ref <5.0)
Triglycerides: 117 mg/dL (ref <150)

## 2020-06-14 LAB — VITAMIN D 25 HYDROXY (VIT D DEFICIENCY, FRACTURES): Vit D, 25-Hydroxy: 74 ng/mL (ref 30–100)

## 2020-06-14 NOTE — Progress Notes (Signed)
Location: [place] of Service:  Clinic (12)  Provider:   Code Status:  Goals of Care:  Advanced Directives 04/15/2020  Does Patient Have a Medical Advance Directive? Yes  Type of Advance Directive Living will;Healthcare Power of Attorney  Does patient want to make changes to medical advance directive? No - Patient declined  Copy of [place] in Chart? -  Would patient like information on creating a medical advance directive? -  Pre-existing out of facility DNR order (yellow form or pink MOST form) Pink MOST form placed in chart (order not valid for inpatient use)     Chief Complaint  Patient presents with  . Medical Management of Chronic Issues    Patient returns to the clinic for follow up.   Marland Kitchen Health Maintenance    TDAP, Dexa, PCV13    HPI: Patient is a 86 y.o. female seen today for an Routine Visit for Follow up  Was in the hospital from 4/3 -4/6 for Left Hip Fracture  She has history of Hypothyroidism, Osteoporosis, Arthritis and GERD and Depression and Anxiety also Insomnia   S/p left trochanteric fracture Underwent IM nail placement But now patient is having some groin pain. She is taking ibuprofen and Aleve alternating.  Does not want to take oxycodone as she thinks it affects her thinking. Was seen by Dr. Alvan Dame.  He thinks that she would need total hip arthroplasty if her pain does not improve Anxiety depression and insomnia Had stopped her trazodone as she thought that was causing tremors Is now just taking Ambien as needed  [place] now Is walking with her walker. Past Medical History:  Diagnosis Date  . Abdominal pain 02/26/2017  . Anxiety   . Arthritis    Left Knee, Wrist, Back and Hips  . Cervical vertebral fusion   . Diverticulitis   . Diverticulosis   . Fibromyalgia   . GERD (gastroesophageal reflux disease)   . Gout 02/2018   L hand  . Hypothyroidism   . Pelvis fracture ([place])     . Peritoneal free air 03/29/2012  . Pneumonia   . Pneumoperitoneum 02/26/2017  . Thyroid disorder   . Toe pain 09/06/2016    Past Surgical History:  Procedure Laterality Date  . ABDOMINAL HYSTERECTOMY    . BOWEL RESECTION N/A 07/09/2017   Procedure: SMALL BOWEL RESECTION;  Surgeon: Johnathan Hausen, MD;  Location: [facility];  Service: General;  Laterality: N/A;  . CERVICAL FUSION     x2  . FEMUR IM NAIL Left 03/03/2020   Procedure: INTRAMEDULLARY (IM) NAIL FEMORAL;  Surgeon: Paralee Cancel, MD;  Location: [facility];  Service: Orthopedics;  Laterality: Left;  . LAPAROSCOPIC APPENDECTOMY N/A 03/04/2017   Procedure: APPENDECTOMY LAPAROSCOPIC;  Surgeon: Johnathan Hausen, MD;  Location: [facility];  Service: General;  Laterality: N/A;  . LAPAROSCOPY N/A 03/04/2017   Procedure: LAPAROSCOPY DIAGNOSTIC, ENTEROLYSIS;  Surgeon: Johnathan Hausen, MD;  [facility]: [facility];  Service: General;  Laterality: N/A;  . LAPAROTOMY N/A 07/09/2017   Procedure: EXPLORATORY LAPAROTOMY;  Surgeon: Johnathan Hausen, MD;  Location: [facility];  Service: General;  Laterality: N/A;  . SPINE SURGERY     Lumbar- rod placement  . TONSILLECTOMY     86y/o  . TOTAL VAGINAL HYSTERECTOMY      Allergies  Allergen Reactions  . Codeine Rash  . Penicillins Rash    Has patient had a PCN reaction causing immediate rash, facial/tongue/throat swelling, SOB  or lightheadedness with hypotension: No Has patient had a PCN reaction causing severe rash involving mucus membranes or skin necrosis: No Has patient had a PCN reaction that required hospitalization: No Has patient had a PCN reaction occurring within the last 10 years: No If all of the above answers are "NO", then may proceed with Cephalosporin use.   Mack Hook [Levofloxacin]     Outpatient Encounter Medications as of 06/14/2020  Medication Sig  . Acetaminophen (TYLENOL EXTRA STRENGTH PO) Take 500 mg by mouth. 2 tabs daily  . Apoaequorin (PREVAGEN PO) Take 10 mg by mouth daily.   . Calcium  Carbonate-Vitamin D (CALCIUM 500/D PO) Take 500 mg by mouth daily.  . Carboxymethylcellulose Sodium (THERATEARS OP) Place 1 drop into both eyes 2 (two) times daily.   . diclofenac Sodium (VOLTAREN) 1 % GEL Apply topically in the morning and at bedtime. To knee   . hydrochlorothiazide (MICROZIDE) 12.5 MG capsule Take 12.5 mg by mouth 2 (two) times a week. Monday & Friday.  . ibandronate (BONIVA) 150 MG tablet Take 150 mg by mouth every 30 (thirty) days. Take in the morning with a full glass of water, on an empty stomach, and do not take anything else by mouth or lie down for the next 30 min.  Marland Kitchen levothyroxine (SYNTHROID, LEVOTHROID) 75 MCG tablet Take 75 mcg by mouth daily before breakfast.   . LORazepam (ATIVAN) 0.5 MG tablet Take 0.5 tablets (0.25 mg total) by mouth 2 (two) times daily as needed for anxiety.  Marland Kitchen omeprazole (PRILOSEC) 20 MG capsule Take 20 mg by mouth daily as needed.   . polyethylene glycol (MIRALAX / GLYCOLAX) 17 g packet Take 17 g by mouth daily as needed.  . polyethylene glycol (MIRALAX / GLYCOLAX) 17 g packet Take 17 g by mouth daily.  Marland Kitchen saccharomyces boulardii (FLORASTOR) 250 MG capsule Take 250 mg by mouth daily.   Marland Kitchen senna-docusate (SENOKOT-S) 8.6-50 MG tablet Take 2 tablets by mouth 2 (two) times daily.  . Vitamin D, Cholecalciferol, 50 MCG (2000 UT) CAPS Take 1 tablet by mouth daily.  Marland Kitchen zolpidem (AMBIEN) 5 MG tablet Take 2.5 mg by mouth at bedtime as needed for sleep.  . traZODone (DESYREL) 50 MG tablet Take 0.5 tablets (25 mg total) by mouth at bedtime. (Patient not taking: Reported on 06/14/2020)  . [DISCONTINUED] oxyCODONE (ROXICODONE) 5 MG immediate release tablet Take 1 tablet (5 mg total) by mouth every 4 (four) hours as needed. (Patient taking differently: Take 2.5 mg by mouth every morning. )   No facility-administered encounter medications on file as of 06/14/2020.    Review of Systems:  Review of Systems  Constitutional: Positive for activity change.  HENT:  Negative.   Respiratory: Negative.   Cardiovascular: Negative.   Gastrointestinal: Negative.   Genitourinary: Negative.   Musculoskeletal: Positive for arthralgias, gait problem and myalgias.  Skin: Negative.   Neurological: Positive for weakness.  Psychiatric/Behavioral: Positive for dysphoric mood and sleep disturbance.    Health Maintenance  Topic Date Due  . TETANUS/TDAP  Never done  . DEXA SCAN  Never done  . PNA vac Low Risk Adult (1 of 2 - PCV13) Never done  . INFLUENZA VACCINE  06/30/2020  . COVID-19 Vaccine  Completed    Physical Exam: Vitals:   06/14/20 1059  BP: 108/60  Pulse: 90  Temp: 97.8 F (36.6 C)  SpO2: 94%  Weight: 95 lb 9.6 oz (43.4 kg)  Height: 5' (1.524 m)   Body mass index is  18.67 kg/m. Physical Exam Vitals reviewed.  Constitutional:      Appearance: Normal appearance.  HENT:     Head: Normocephalic.     Nose: Nose normal.     Mouth/Throat:     Mouth: Mucous membranes are moist.     Pharynx: Oropharynx is clear.  Eyes:     Pupils: Pupils are equal, round, and reactive to light.  Cardiovascular:     Rate and Rhythm: Normal rate and regular rhythm.     Pulses: Normal pulses.  Pulmonary:     Effort: Pulmonary effort is normal.     Breath sounds: Normal breath sounds.  Abdominal:     General: Abdomen is flat. Bowel sounds are normal.     Palpations: Abdomen is soft.  Musculoskeletal:        General: No swelling.     Cervical back: Neck supple.  Skin:    General: Skin is warm.  Neurological:     General: No focal deficit present.     Mental Status: She is alert and oriented to person, place, and time.     Comments: Is walking with a walker.  Was able to get up from the chair with no assist.  Psychiatric:        Mood and Affect: Mood normal.     Comments: Less anxious today as she knows [place]     Labs reviewed: Basic Metabolic Panel: Recent Labs    03/04/20 0452 03/04/20 0452 03/05/20 0437 03/05/20 0437  03/08/20 0000 03/08/20 0000 03/20/20 0000 04/17/20 0000 06/13/20 0700  NA 133*   < > 137  --  133*   < > 130* 130* 132*  K 4.6   < > 4.6   < > 4.1   < > 4.9 4.5 4.5  CL 105   < > 109   < > 101   < > 98* 96* 100  CO2 22   < > 24   < > 27*   < > 98* 27* 26  GLUCOSE 124*  --  102*  --   --   --   --   --  87  BUN 12   < > 12  --  9   < > 11 10 20   CREATININE 0.58   < > 0.61  --  0.6   < > 0.6 0.9 0.67  CALCIUM 8.2*   < > 8.0*   < > 8.1*   < > 9.2 9.5 9.5  TSH  --   --   --   --  2.61  --   --  1.80  --    < > = values in this interval not displayed.   Liver Function Tests: Recent Labs    03/08/20 0000 03/08/20 0000 03/20/20 0000 04/17/20 0000 06/13/20 0700  AST 19   < > 16 18 19   ALT 6*   < > 10 10 13   ALKPHOS 43  --  75 72  --   BILITOT  --   --   --   --  0.6  PROT  --   --   --   --  6.6  ALBUMIN 3.2*  --  3.3* 3.7  --    < > = values in this interval not displayed.   No results for input(s): LIPASE, AMYLASE in the last 8760 hours. No results for input(s): AMMONIA in the last 8760 hours. CBC: Recent Labs  03/02/20 2255 03/02/20 2255 03/04/20 0452 03/04/20 0452 03/05/20 0437 03/05/20 0437 03/08/20 0000 03/08/20 0000 03/12/20 0000 03/20/20 0000 04/17/20 0000  WBC 15.6*   < > 8.4   < > 7.2  --  6.2   < > 11.0 7.9 6.6  NEUTROABS 13.4*   < >  --   --  3.9  --  3,819  --  8,448 5,419  --   HGB 12.6   < > 7.7*   < > 7.0*   < > 9.2*   < > 10.2* 9.6* 12.8  HCT 37.7   < > 23.2*   < > 21.5*   < > 27*   < > 31* 29* 38  MCV 98.2  --  100.9*  --  100.9*  --   --   --   --   --   --   PLT 324   < > 226   < > 220   < > 339   < > 643* 933* 470*   < > = values in this interval not displayed.   Lipid Panel: Recent Labs    06/13/20 0700  CHOL 181  HDL 67  LDLCALC 93  TRIG 117  CHOLHDL 2.7   Lab Results  Component Value Date   HGBA1C 5.9 (H) 06/13/2020    Procedures since last visit: No results found.  Assessment/Plan . Left hip pain Discussed with the  patient to avoid Naprosyn and ibuprofen due to risk of side effects Patient will try tramadol as needed Patient can also use lidocaine patches or Voltaren gel Has follow-up with Dr. Alvan Dame to discuss arthroplasty  Blood loss anemia Resolved and is off iron now Hyponatremia Sodium is still low.  Continue to monitor Hypothyroidism  TSH in normal limits in 5/21 Age-related osteoporosis  Continues to take Boniva  anxiety Ativan as needed Insomnia secondary to anxiety Discontinue trazodone Will continue on Ambien Cannot use SSRI at this time Will consider Cymbalta once patient moved to [place] Memory deficit MMSE in 11/2017 was [place]  [place] moving [place] would be followed there  Labs/tests ordered:  * No order type specified * Next appt:  Visit date not found  Total time spent in this patient care encounter was  45_  minutes; greater than 50% of the visit spent counseling patient and staff, reviewing records , Labs and coordinating care for problems addressed at this encounter.

## 2020-06-15 MED ORDER — LEVOTHYROXINE SODIUM 75 MCG PO TABS: 75.0000 ug | ORAL_TABLET | Freq: Every day | ORAL | 1 refills | Status: AC

## 2020-06-19 ENCOUNTER — Observation Stay (HOSPITAL_COMMUNITY)
Admission: EM | Admit: 2020-06-19 | Discharge: 2020-06-20 | Disposition: A | Discharge status: Home / Self Care | Payer: Medicare Other | Attending: Internal Medicine | Admitting: Internal Medicine

## 2020-06-19 ENCOUNTER — Emergency Department (HOSPITAL_COMMUNITY): Payer: Medicare Other

## 2020-06-19 ENCOUNTER — Inpatient Hospital Stay (HOSPITAL_COMMUNITY): Payer: Medicare Other

## 2020-06-19 ENCOUNTER — Encounter (HOSPITAL_COMMUNITY): Payer: Self-pay | Admitting: Emergency Medicine

## 2020-06-19 ENCOUNTER — Other Ambulatory Visit: Payer: Self-pay

## 2020-06-19 DIAGNOSIS — E871 Hypo-osmolality and hyponatremia: Secondary | ICD-10-CM | POA: Diagnosis present

## 2020-06-19 DIAGNOSIS — I639 Cerebral infarction, unspecified: Secondary | ICD-10-CM | POA: Diagnosis not present

## 2020-06-19 DIAGNOSIS — E039 Hypothyroidism, unspecified: Secondary | ICD-10-CM | POA: Insufficient documentation

## 2020-06-19 DIAGNOSIS — Z8673 Personal history of transient ischemic attack (TIA), and cerebral infarction without residual deficits: Secondary | ICD-10-CM | POA: Insufficient documentation

## 2020-06-19 DIAGNOSIS — Z20822 Contact with and (suspected) exposure to covid-19: Secondary | ICD-10-CM | POA: Insufficient documentation

## 2020-06-19 DIAGNOSIS — I10 Essential (primary) hypertension: Secondary | ICD-10-CM | POA: Diagnosis present

## 2020-06-19 DIAGNOSIS — Z79899 Other long term (current) drug therapy: Secondary | ICD-10-CM | POA: Diagnosis not present

## 2020-06-19 DIAGNOSIS — G459 Transient cerebral ischemic attack, unspecified: Secondary | ICD-10-CM | POA: Diagnosis present

## 2020-06-19 DIAGNOSIS — R8271 Bacteriuria: Secondary | ICD-10-CM | POA: Diagnosis present

## 2020-06-19 DIAGNOSIS — D75839 Thrombocytosis, unspecified: Secondary | ICD-10-CM | POA: Diagnosis present

## 2020-06-19 DIAGNOSIS — Z87891 Personal history of nicotine dependence: Secondary | ICD-10-CM | POA: Diagnosis not present

## 2020-06-19 DIAGNOSIS — R531 Weakness: Secondary | ICD-10-CM | POA: Diagnosis not present

## 2020-06-19 LAB — CBC WITH DIFFERENTIAL/PLATELET
Abs Immature Granulocytes: 0.04 10*3/uL (ref 0.00–0.07)
Basophils Absolute: 0.1 10*3/uL (ref 0.0–0.1)
Basophils Relative: 1 %
Eosinophils Absolute: 0.4 10*3/uL (ref 0.0–0.5)
Eosinophils Relative: 5 %
HCT: 39.4 % (ref 36.0–46.0)
Hemoglobin: 12.7 g/dL (ref 12.0–15.0)
Immature Granulocytes: 1 %
Lymphocytes Relative: 18 %
Lymphs Abs: 1.3 10*3/uL (ref 0.7–4.0)
MCH: 31.8 pg (ref 26.0–34.0)
MCHC: 32.2 g/dL (ref 30.0–36.0)
MCV: 98.5 fL (ref 80.0–100.0)
Monocytes Absolute: 0.7 10*3/uL (ref 0.1–1.0)
Monocytes Relative: 10 %
Neutro Abs: 4.6 10*3/uL (ref 1.7–7.7)
Neutrophils Relative %: 65 %
Platelets: 471 10*3/uL — ABNORMAL HIGH (ref 150–400)
RBC: 4 MIL/uL (ref 3.87–5.11)
RDW: 13.4 % (ref 11.5–15.5)
WBC: 7.1 10*3/uL (ref 4.0–10.5)
nRBC: 0 % (ref 0.0–0.2)

## 2020-06-19 LAB — CREATININE, SERUM
Creatinine, Ser: 0.65 mg/dL (ref 0.44–1.00)
GFR calc Af Amer: >60 mL/min (ref >60)
GFR calc non Af Amer: >60 mL/min (ref >60)

## 2020-06-19 LAB — CBC
HCT: 38.9 % (ref 36.0–46.0)
Hemoglobin: 12.6 g/dL (ref 12.0–15.0)
MCH: 31.6 pg (ref 26.0–34.0)
MCHC: 32.4 g/dL (ref 30.0–36.0)
MCV: 97.5 fL (ref 80.0–100.0)
Platelets: 418 10*3/uL — ABNORMAL HIGH (ref 150–400)
RBC: 3.99 MIL/uL (ref 3.87–5.11)
RDW: 13.3 % (ref 11.5–15.5)
WBC: 6 10*3/uL (ref 4.0–10.5)
nRBC: 0 % (ref 0.0–0.2)

## 2020-06-19 LAB — URINALYSIS, ROUTINE W REFLEX MICROSCOPIC
Bilirubin Urine: NEGATIVE
Glucose, UA: NEGATIVE mg/dL
Hgb urine dipstick: NEGATIVE
Ketones, ur: NEGATIVE mg/dL
Nitrite: NEGATIVE
Protein, ur: NEGATIVE mg/dL
Specific Gravity, Urine: 1.011 (ref 1.005–1.030)
pH: 7 (ref 5.0–8.0)

## 2020-06-19 LAB — COMPREHENSIVE METABOLIC PANEL
ALT: 14 U/L (ref 0–44)
AST: 24 U/L (ref 15–41)
Albumin: 3.7 g/dL (ref 3.5–5.0)
Alkaline Phosphatase: 55 U/L (ref 38–126)
Anion gap: 10 (ref 5–15)
BUN: 20 mg/dL (ref 8–23)
CO2: 23 mmol/L (ref 22–32)
Calcium: 9.8 mg/dL (ref 8.9–10.3)
Chloride: 97 mmol/L — ABNORMAL LOW (ref 98–111)
Creatinine, Ser: 0.71 mg/dL (ref 0.44–1.00)
GFR calc Af Amer: >60 mL/min (ref >60)
GFR calc non Af Amer: >60 mL/min (ref >60)
Glucose, Bld: 109 mg/dL — ABNORMAL HIGH (ref 70–99)
Potassium: 4.3 mmol/L (ref 3.5–5.1)
Sodium: 130 mmol/L — ABNORMAL LOW (ref 135–145)
Total Bilirubin: 0.6 mg/dL (ref 0.3–1.2)
Total Protein: 6.9 g/dL (ref 6.5–8.1)

## 2020-06-19 LAB — PROTIME-INR
INR: 0.9 (ref 0.8–1.2)
Prothrombin Time: 11.7 seconds (ref 11.4–15.2)

## 2020-06-19 LAB — SARS CORONAVIRUS 2 BY RT PCR (HOSPITAL ORDER, PERFORMED IN ~~LOC~~ HOSPITAL LAB): SARS Coronavirus 2: NEGATIVE

## 2020-06-19 LAB — APTT: aPTT: 26 seconds (ref 24–36)

## 2020-06-19 LAB — TSH: TSH: 2.389 u[IU]/mL (ref 0.350–4.500)

## 2020-06-19 MED ORDER — ACETAMINOPHEN 160 MG/5ML PO SOLN: 650.0000 mg | ORAL | Status: DC | PRN

## 2020-06-19 MED ORDER — SENNOSIDES-DOCUSATE SODIUM 8.6-50 MG PO TABS
2.0000 | ORAL_TABLET | Freq: Two times a day (BID) | ORAL | Status: DC
  Administered 2020-06-20: 2 via ORAL
  Filled 2020-06-19: qty 2

## 2020-06-19 MED ORDER — ATORVASTATIN CALCIUM 80 MG PO TABS
80.0000 mg | ORAL_TABLET | Freq: Every day | ORAL | Status: DC
  Administered 2020-06-20: 80 mg via ORAL
  Filled 2020-06-19: qty 1

## 2020-06-19 MED ORDER — PANTOPRAZOLE SODIUM 40 MG PO TBEC
40.0000 mg | DELAYED_RELEASE_TABLET | Freq: Every day | ORAL | Status: DC
  Administered 2020-06-20: 40 mg via ORAL
  Filled 2020-06-19: qty 1

## 2020-06-19 MED ORDER — ACETAMINOPHEN 500 MG PO TABS: 500.0000 mg | ORAL_TABLET | ORAL | Status: DC | PRN

## 2020-06-19 MED ORDER — IOHEXOL 350 MG/ML SOLN
50.0000 mL | Freq: Once | INTRAVENOUS | Status: AC | PRN
  Administered 2020-06-19: 50 mL via INTRAVENOUS

## 2020-06-19 MED ORDER — NAPROXEN SODIUM 220 MG PO TABS: 220.0000 mg | ORAL_TABLET | Freq: Every day | ORAL | Status: DC | PRN

## 2020-06-19 MED ORDER — ENOXAPARIN SODIUM 30 MG/0.3ML ~~LOC~~ SOLN: 30.0000 mg | SUBCUTANEOUS | Status: DC

## 2020-06-19 MED ORDER — LORAZEPAM 0.5 MG PO TABS: 0.2500 mg | ORAL_TABLET | Freq: Two times a day (BID) | ORAL | Status: DC | PRN

## 2020-06-19 MED ORDER — POLYETHYLENE GLYCOL 3350 17 G PO PACK: 17.0000 g | PACK | Freq: Every day | ORAL | Status: DC | PRN

## 2020-06-19 MED ORDER — STROKE: EARLY STAGES OF RECOVERY BOOK
Freq: Once | Status: DC
  Filled 2020-06-19 (×2): qty 1

## 2020-06-19 MED ORDER — ASPIRIN 300 MG RE SUPP: 300.0000 mg | Freq: Every day | RECTAL | Status: DC

## 2020-06-19 MED ORDER — ACETAMINOPHEN 325 MG PO TABS
650.0000 mg | ORAL_TABLET | ORAL | Status: DC | PRN
  Administered 2020-06-19 – 2020-06-20 (×2): 650 mg via ORAL
  Filled 2020-06-19 (×2): qty 2

## 2020-06-19 MED ORDER — ZOLPIDEM TARTRATE 5 MG PO TABS
2.5000 mg | ORAL_TABLET | Freq: Every evening | ORAL | Status: DC | PRN
  Administered 2020-06-19: 2.5 mg via ORAL
  Filled 2020-06-19: qty 1

## 2020-06-19 MED ORDER — LEVOTHYROXINE SODIUM 75 MCG PO TABS
75.0000 ug | ORAL_TABLET | Freq: Every day | ORAL | Status: DC
  Administered 2020-06-20: 75 ug via ORAL
  Filled 2020-06-19: qty 1

## 2020-06-19 MED ORDER — ACETAMINOPHEN 650 MG RE SUPP: 650.0000 mg | RECTAL | Status: DC | PRN

## 2020-06-19 MED ORDER — ENOXAPARIN SODIUM 40 MG/0.4ML ~~LOC~~ SOLN: 40.0000 mg | SUBCUTANEOUS | Status: DC

## 2020-06-19 MED ORDER — ASPIRIN 325 MG PO TABS
325.0000 mg | ORAL_TABLET | Freq: Every day | ORAL | Status: DC
  Administered 2020-06-19 – 2020-06-20 (×2): 325 mg via ORAL
  Filled 2020-06-19 (×2): qty 1

## 2020-06-19 NOTE — H&P (Signed)
History and Physical    DAI APEL NWG:[id number] DOB: 04/06/1930 DOA: 06/19/2020  PCP: [name], MD  Patient coming from: SNF-friendly manner  I have personally briefly reviewed patient's old medical records in [employer]  Chief Complaint: Left-sided weakness started 11 AM this morning  HPI: Sara Davila is a 86 y.o. female with medical history significant of hypothyroidism, GERD, neuropathy, osteoporosis, insomnia, anxiety presents to emergency department due to left-sided weakness.  Patient tells me that she was doing fine this morning.  Around 11 AM she was unable to get up from the chair due to left-sided weakness.  Reports that she is unable to hold anything in her left hand due to weakness.  Reports that her symptoms has resolved now.  Reports right-sided headache however denies association with blurry vision, slurred speech, facial drop, loss of consciousness, head trauma, seizures, lightheadedness, dizziness, chest pain, shortness of breath, palpitation, leg swelling, nausea, vomiting, fever, chills, dysuria, hematuria, foul-smelling urine, back pain, bowel changes.  She tells me that she is moving from independent living facility to assisted living facility this Friday as she had hip surgery in April of this year as she needs more help.  No history of smoking, illicit drug use however drinks alcohol occasionally.  ED Course: Upon arrival to ED: Patient blood pressure was noted to be 176/98, afebrile with no leukocytosis, on room air, sodium 130, PT/INR, APTT, CMP: WNL, UA positive for leukocyte and bacteria.  Platelet: 471, COVID-19 pending.  CT head negative for acute findings.  Neurology recommended [facility] hospitalist admission for stroke work-up.    Review of Systems: As per HPI otherwise negative.    Past Medical History:  Diagnosis Date  . Abdominal pain 02/26/2017  . Anxiety   . Arthritis    Left Knee, Wrist, Back and Hips  . Cervical vertebral fusion     . Diverticulitis   . Diverticulosis   . Fibromyalgia   . GERD (gastroesophageal reflux disease)   . Gout 02/2018   L hand  . Hypothyroidism   . Pelvis fracture ([place])   . Peritoneal free air 03/29/2012  . Pneumonia   . Pneumoperitoneum 02/26/2017  . Thyroid disorder   . TIA (transient ischemic attack)   . Toe pain 09/06/2016    Past Surgical History:  Procedure Laterality Date  . ABDOMINAL HYSTERECTOMY    . BOWEL RESECTION N/A 07/09/2017   Procedure: SMALL BOWEL RESECTION;  Surgeon: Johnathan Hausen, MD;  [facility]: [facility];  Service: General;  Laterality: N/A;  . CERVICAL FUSION     x2  . FEMUR IM NAIL Left 03/03/2020   Procedure: INTRAMEDULLARY (IM) NAIL FEMORAL;  Surgeon: Paralee Cancel, MD;  [facility]: [facility];  Service: Orthopedics;  Laterality: Left;  . LAPAROSCOPIC APPENDECTOMY N/A 03/04/2017   Procedure: APPENDECTOMY LAPAROSCOPIC;  Surgeon: Johnathan Hausen, MD;  [facility]: [facility];  Service: General;  Laterality: N/A;  . LAPAROSCOPY N/A 03/04/2017   Procedure: LAPAROSCOPY DIAGNOSTIC, ENTEROLYSIS;  Surgeon: Johnathan Hausen, MD;  [facility];  Service: General;  Laterality: N/A;  . LAPAROTOMY N/A 07/09/2017   Procedure: EXPLORATORY LAPAROTOMY;  Surgeon: Johnathan Hausen, MD;  [facility];  Service: General;  Laterality: N/A;  . SPINE SURGERY     Lumbar- rod placement  . TONSILLECTOMY     86y/o  . TOTAL VAGINAL HYSTERECTOMY       reports that she has quit smoking. She has never used smokeless tobacco. She reports current alcohol use. She reports that she does  not use drugs.  Allergies  Allergen Reactions  . Codeine Rash  . Penicillins Rash    Has patient had a PCN reaction causing immediate rash, facial/tongue/throat swelling, SOB or lightheadedness with hypotension: No Has patient had a PCN reaction causing severe rash involving mucus membranes or skin necrosis: No Has patient had a PCN reaction that required hospitalization: No Has patient had a PCN reaction occurring  within the last 10 years: No If all of the above answers are "NO", then may proceed with Cephalosporin use.   Mack Hook [Levofloxacin]     Family History  Problem Relation Age of Onset  . Asthma Mother   . Pulmonary embolism Mother   . Macular degeneration Mother   . Heart disease Father   . Stroke Father   . Stroke Paternal Uncle   . Breast cancer Maternal Aunt   . Macular degeneration Sister   . Stroke Sister   . Neuropathy Neg Hx     Prior to Admission medications   Medication Sig Start Date End Date Taking? Authorizing Provider  Acetaminophen (TYLENOL EXTRA STRENGTH PO) Take 500 mg by mouth in the morning and at bedtime. 2 tabs daily   Yes [provider]  Apoaequorin (PREVAGEN PO) Take 10 mg by mouth daily.    Yes [provider]  Calcium Carbonate-Vitamin D (CALCIUM 500/D PO) Take 500 mg by mouth daily.   Yes [provider]  Carboxymethylcellulose Sodium (THERATEARS OP) Place 1 drop into both eyes 2 (two) times daily.    Yes [provider]  diclofenac Sodium (VOLTAREN) 1 % GEL Apply topically in the morning and at bedtime. To knee    Yes [provider]  hydrochlorothiazide (MICROZIDE) 12.5 MG capsule Take 12.5 mg by mouth 2 (two) times a week. Monday & Friday. 02/18/18  Yes [provider]  ibandronate (BONIVA) 150 MG tablet Take 150 mg by mouth every 30 (thirty) days. Take in the morning with a full glass of water, on an empty stomach, and do not take anything else by mouth or lie down for the next 30 min.   Yes [provider]  ibuprofen (ADVIL) 200 MG tablet Take 200 mg by mouth daily as needed for moderate pain.    Yes [provider]  levothyroxine (SYNTHROID) 75 MCG tablet Take 1 tablet (75 mcg total) by mouth daily before breakfast. 06/15/20  Yes [name], MD  LORazepam (ATIVAN) 0.5 MG tablet Take 0.5 tablets (0.25 mg total) by mouth 2 (two) times daily as needed for anxiety. 03/12/20  Yes  [name], MD  naproxen sodium (ALEVE) 220 MG tablet Take 220 mg by mouth daily as needed (for pain).    Yes [provider]  omeprazole (PRILOSEC) 20 MG capsule Take 20 mg by mouth daily as needed.    Yes [provider]  polyethylene glycol (MIRALAX / GLYCOLAX) 17 g packet Take 17 g by mouth daily as needed.   Yes [provider]  polyethylene glycol (MIRALAX / GLYCOLAX) 17 g packet Take 17 g by mouth daily. 03/05/20  Yes Shelly Coss, MD  saccharomyces boulardii (FLORASTOR) 250 MG capsule Take 250 mg by mouth daily.    Yes [provider]  senna-docusate (SENOKOT-S) 8.6-50 MG tablet Take 2 tablets by mouth 2 (two) times daily. 03/05/20  Yes Shelly Coss, MD  Vitamin D, Cholecalciferol, 50 MCG (2000 UT) CAPS Take 1 tablet by mouth daily.   Yes [provider]  zolpidem (AMBIEN) 5  MG tablet Take 2.5 mg by mouth at bedtime as needed for sleep.   Yes [provider]    Physical Exam: Vitals:   06/19/20 1228 06/19/20 1307 06/19/20 1330  BP:  (!) 161/100 (!) 176/98  Pulse:  84 (!) 28  Resp:  (!) 21 15  Temp:  97.9 F (36.6 C)   TempSrc:  Oral   SpO2:  99% 98%  Weight: 43.4 kg    Height: 5' (1.524 m)      Constitutional: NAD, calm, comfortable, on room air, communicating well, following commands Eyes: PERRL, lids and conjunctivae normal ENMT: Mucous membranes are moist. Posterior pharynx clear of any exudate or lesions.Normal dentition.  Neck: normal, supple, no masses, no thyromegaly Respiratory: clear to auscultation bilaterally, no wheezing, no crackles. Normal respiratory effort. No accessory muscle use.  Cardiovascular: Regular rate and rhythm, no murmurs / rubs / gallops. No extremity edema. 2+ pedal pulses. No carotid bruits.  Abdomen: no tenderness, no masses palpated. No hepatosplenomegaly. Bowel sounds positive.  Musculoskeletal: no clubbing / cyanosis. No joint deformity upper and lower extremities. Good ROM, no  contractures. Normal muscle tone.  Skin: no rashes, lesions, ulcers. No induration Neurologic: CN 2-12 grossly intact. Sensation intact, DTR normal. Strength 5/5 in all 4.  Psychiatric: Normal judgment and insight. Alert and oriented x 3. Normal mood.    Labs on Admission: I have personally reviewed following labs and imaging studies  CBC: Recent Labs  Lab 06/19/20 1248  WBC 7.1  NEUTROABS 4.6  HGB 12.7  HCT 39.4  MCV 98.5  PLT 509*   Basic Metabolic Panel: Recent Labs  Lab 06/13/20 0700 06/19/20 1248  NA 132* 130*  K 4.5 4.3  CL 100 97*  CO2 26 23  GLUCOSE 87 109*  BUN 20 20  CREATININE 0.67 0.71  CALCIUM 9.5 9.8   GFR: Estimated Creatinine Clearance: 33.9 mL/min (by C-G formula based on SCr of 0.71 mg/dL). Liver Function Tests: Recent Labs  Lab 06/13/20 0700 06/19/20 1248  AST 19 24  ALT 13 14  ALKPHOS  --  55  BILITOT 0.6 0.6  PROT 6.6 6.9  ALBUMIN  --  3.7   No results for input(s): LIPASE, AMYLASE in the last 168 hours. No results for input(s): AMMONIA in the last 168 hours. Coagulation Profile: Recent Labs  Lab 06/19/20 1248  INR 0.9   Cardiac Enzymes: No results for input(s): CKTOTAL, CKMB, CKMBINDEX, TROPONINI in the last 168 hours. BNP (last 3 results) No results for input(s): PROBNP in the last 8760 hours. HbA1C: No results for input(s): HGBA1C in the last 72 hours. CBG: No results for input(s): GLUCAP in the last 168 hours. Lipid Profile: No results for input(s): CHOL, HDL, LDLCALC, TRIG, CHOLHDL, LDLDIRECT in the last 72 hours. Thyroid Function Tests: No results for input(s): TSH, T4TOTAL, FREET4, T3FREE, THYROIDAB in the last 72 hours. Anemia Panel: No results for input(s): VITAMINB12, FOLATE, FERRITIN, TIBC, IRON, RETICCTPCT in the last 72 hours. Urine analysis:    Component Value Date/Time   COLORURINE YELLOW 06/19/2020 1248   APPEARANCEUR HAZY (A) 06/19/2020 1248   LABSPEC 1.011 06/19/2020 1248   PHURINE 7.0 06/19/2020 1248    GLUCOSEU NEGATIVE 06/19/2020 1248   HGBUR NEGATIVE 06/19/2020 1248   BILIRUBINUR NEGATIVE 06/19/2020 1248   KETONESUR NEGATIVE 06/19/2020 1248   PROTEINUR NEGATIVE 06/19/2020 1248   NITRITE NEGATIVE 06/19/2020 1248   LEUKOCYTESUR TRACE (A) 06/19/2020 1248    Radiological Exams on Admission: CT Head Wo Contrast  Result  Date: 06/19/2020 CLINICAL DATA:  Extremity weakness EXAM: CT HEAD WITHOUT CONTRAST TECHNIQUE: Contiguous axial images were obtained from the base of the skull through the vertex without intravenous contrast. COMPARISON:  CT 03/02/2020 FINDINGS: Brain: No evidence of acute infarction, hemorrhage, hydrocephalus, extra-axial collection or mass lesion/mass effect. Symmetric prominence of the ventricles, cisterns and sulci compatible with parenchymal volume loss. Patchy areas of white matter hypoattenuation are most compatible with chronic microvascular angiopathy. Vascular: Atherosclerotic calcification of the carotid siphons. No hyperdense vessel. Skull: No calvarial fracture or suspicious osseous lesion. No scalp swelling or hematoma. Sinuses/Orbits: Paranasal sinuses and mastoid air cells are predominantly clear. Orbital structures are unremarkable aside from prior lens extractions. Other: None. IMPRESSION: 1. No acute intracranial findings. 2. Chronic microvascular angiopathy and parenchymal volume loss. Electronically Signed   By: Lovena Le M.D.   On: 06/19/2020 15:10    EKG: Independently reviewed.  Sinus rhythm, ventricular trigeminy, no ST elevation or depression noted.  Assessment/Plan Principal Problem:   CVA (cerebral vascular accident) [facility]) Active Problems:   Hypothyroidism   Hyponatremia   Asymptomatic bacteriuria   HTN (hypertension), benign   Thrombocytosis (HCC)    TIA/ischemic stroke: -Patient presented with left-sided weakness-her symptoms has resolved upon arrival. -admit forTelemetry monitoring -Allow for permissive hypertension for the first 24-48h -  only treat PRN if SBP >284 mmHg or diastolic blood pressure >132. Blood pressures can be gradually normalized to SBP<140 upon discharge. -CTA head and neck is ordered and is pending.  Ordered MRI brain without contrast and transthoracic echo, lipid panel and A1c. -Frequent neuro checks -Start on aspirin and atorvastatin -Consult Neurology-appreciate help -PT/OT eval, Speech consult  Hypertension: Blood pressure is elevated -Hold HCTZ to allow permissive hypertension.  Monitor blood pressure closely  Hypothyroidism: Check TSH -Continue levothyroxine  Anxiety with insomnia: Continue home meds-Ativan and Ambien as needed  GERD: Continue PPI  Chronic constipation: Continue Senokot and MiraLAX  Left hip fracture status post left-sided intramedullary nailing of the femur on 03/03/2020 -Continue Tylenol/naproxen as needed for pain  Hyponatremia: Sodium of 130-chronic -Could be secondary to HCTZ.  Patient is asymptomatic.  Continue to monitor  Thrombocytosis: Platelet: 471 -Monitor  Asymptomatic bacteriuria: Patient denies urinary symptoms.  She is afebrile with no leukocytosis.  No indication of antibiotics at this time.  DVT prophylaxis: Lovenox/SCD Code Status: DNR-confirmed with the patient  family Communication: None present at bedside.  Plan of care discussed with patient in length and she verbalized understanding and agreed with it. Disposition Plan: Assisted living facility in 1 to 2 days  consults called: Neurology by EDP Admission status: Inpatient   Mckinley Jewel MD [facility]  If 7PM-7AM, please contact night-coverage [email or web address] Password [email or web address]  06/19/2020, 4:08 PM

## 2020-06-19 NOTE — ED Notes (Signed)
Patient transported to CT 

## 2020-06-19 NOTE — ED Notes (Signed)
Urine and culture has been sent.

## 2020-06-19 NOTE — ED Triage Notes (Signed)
Pt brought to ED by [facility] from [place] c/o left side arm weakness that started today at 11:09, on EMS arrival pt didn't have any weakness or neuro symptoms, per pt she has hx of TIA. BP  172/90, HR 90, SPO2 98% CBG 124.

## 2020-06-19 NOTE — Consult Note (Signed)
Neurology Consultation  Reason for Consult: TIA Referring Physician: Gareth Morgan, MD  CC: Left arm and leg weakness-transient  History is obtained from: Patient  HPI: Sara Davila is a 86 y.o. female with history of TIA in the past.  Patient states that today at 40 AM she was sitting in her chair.  Usually she has no difficulty getting out of the chair.  However she tried to get up and noted she was weak in her left leg and tended to drift to the left even with her walker.  At the same time she also noticed that her left arm was very weak to the point as if she could not control it.  She finally did use her walker and get to the bathroom, however on the way back, she felt that her left arm was still very weak thus she called EMS.  The whole incident she states lasted for about 5 minutes and then resolved.  At this point she feels that she only has slight weakness in her left leg but that is due to the fact that she has broken her femur and has always had pain in her femur and also in her left hip.  She does not take aspirin on a daily basis.  She does not smoke.  Currently she is nervous about the event but feels back to baseline.  Patient has seen Dr. Lavell Anchors in the past 2 years ago go [facility] however this was for cognitive decline.  LKW: 2-day at 86 AM tpa given?: no, minimal symptoms Premorbid modified Rankin scale (mRS): 0 NIH stroke score 0   Past Medical History:  Diagnosis Date  . Abdominal pain 02/26/2017  . Anxiety   . Arthritis    Left Knee, Wrist, Back and Hips  . Cervical vertebral fusion   . Diverticulitis   . Diverticulosis   . Fibromyalgia   . GERD (gastroesophageal reflux disease)   . Gout 02/2018   L hand  . Hypothyroidism   . Pelvis fracture ([place])   . Peritoneal free air 03/29/2012  . Pneumonia   . Pneumoperitoneum 02/26/2017  . Thyroid disorder   . TIA (transient ischemic attack)   . Toe pain 09/06/2016    Family History  Problem  Relation Age of Onset  . Asthma Mother   . Pulmonary embolism Mother   . Macular degeneration Mother   . Heart disease Father   . Stroke Father   . Stroke Paternal Uncle   . Breast cancer Maternal Aunt   . Macular degeneration Sister   . Stroke Sister   . Neuropathy Neg Hx    Social History:   reports that she has quit smoking. She has never used smokeless tobacco. She reports current alcohol use. She reports that she does not use drugs.  Medications No current facility-administered medications for this encounter.  Current Outpatient Medications:  .  Acetaminophen (TYLENOL EXTRA STRENGTH PO), Take 500 mg by mouth. 2 tabs daily, Disp: , Rfl:  .  Apoaequorin (PREVAGEN PO), Take 10 mg by mouth daily. , Disp: , Rfl:  .  Calcium Carbonate-Vitamin D (CALCIUM 500/D PO), Take 500 mg by mouth daily., Disp: , Rfl:  .  Carboxymethylcellulose Sodium (THERATEARS OP), Place 1 drop into both eyes 2 (two) times daily. , Disp: , Rfl:  .  diclofenac Sodium (VOLTAREN) 1 % GEL, Apply topically in the morning and at bedtime. To knee , Disp: , Rfl:  .  hydrochlorothiazide (MICROZIDE) 12.5 MG capsule,  Take 12.5 mg by mouth 2 (two) times a week. Monday & Friday., Disp: , Rfl:  .  ibandronate (BONIVA) 150 MG tablet, Take 150 mg by mouth every 30 (thirty) days. Take in the morning with a full glass of water, on an empty stomach, and do not take anything else by mouth or lie down for the next 30 min., Disp: , Rfl:  .  ibuprofen (ADVIL) 200 MG tablet, Take 200 mg by mouth daily as needed., Disp: , Rfl:  .  levothyroxine (SYNTHROID) 75 MCG tablet, Take 1 tablet (75 mcg total) by mouth daily before breakfast., Disp: 30 tablet, Rfl: 1 .  LORazepam (ATIVAN) 0.5 MG tablet, Take 0.5 tablets (0.25 mg total) by mouth 2 (two) times daily as needed for anxiety., Disp: 30 tablet, Rfl: 0 .  naproxen sodium (ALEVE) 220 MG tablet, Take 220 mg by mouth daily as needed., Disp: , Rfl:  .  omeprazole (PRILOSEC) 20 MG capsule, Take  20 mg by mouth daily as needed. , Disp: , Rfl:  .  polyethylene glycol (MIRALAX / GLYCOLAX) 17 g packet, Take 17 g by mouth daily as needed., Disp: , Rfl:  .  polyethylene glycol (MIRALAX / GLYCOLAX) 17 g packet, Take 17 g by mouth daily., Disp: 14 each, Rfl: 0 .  saccharomyces boulardii (FLORASTOR) 250 MG capsule, Take 250 mg by mouth daily. , Disp: , Rfl:  .  senna-docusate (SENOKOT-S) 8.6-50 MG tablet, Take 2 tablets by mouth 2 (two) times daily., Disp:  , Rfl:  .  Vitamin D, Cholecalciferol, 50 MCG (2000 UT) CAPS, Take 1 tablet by mouth daily., Disp: , Rfl:  .  zolpidem (AMBIEN) 5 MG tablet, Take 2.5 mg by mouth at bedtime as needed for sleep., Disp: , Rfl:   ROS:    General ROS: negative for - chills, fatigue, fever, night sweats, weight gain or weight loss Psychological ROS: Positive for -memory difficulties Ophthalmic ROS: negative for - blurry vision, double vision, eye pain or loss of vision ENT ROS: negative for - epistaxis, nasal discharge, oral lesions, sore throat, tinnitus or vertigo Allergy and Immunology ROS: negative for - hives or itchy/watery eyes Hematological and Lymphatic ROS: negative for - bleeding problems, bruising or swollen lymph nodes Endocrine ROS: negative for - galactorrhea, hair pattern changes, polydipsia/polyuria or temperature intolerance Respiratory ROS: negative for - cough, hemoptysis, shortness of breath or wheezing Cardiovascular ROS: negative for - chest pain, dyspnea on exertion, edema or irregular heartbeat Gastrointestinal ROS: negative for - abdominal pain, diarrhea, hematemesis, nausea/vomiting or stool incontinence Genito-Urinary ROS: negative for - dysuria, hematuria, incontinence or urinary frequency/urgency Musculoskeletal ROS: Positive for -muscular weakness Neurological ROS: as noted in HPI Dermatological ROS: negative for rash and skin lesion changes  Exam: Current vital signs: BP (!) 176/98   Pulse (!) 28   Temp 97.9 F (36.6 C)  (Oral)   Resp 15   Ht 5' (1.524 m)   Wt 43.4 kg   SpO2 98%   BMI 18.69 kg/m  Vital signs in last 24 hours: Temp:  [97.9 F (36.6 C)] 97.9 F (36.6 C) (07/21 1307) Pulse Rate:  [28-84] 28 (07/21 1330) Resp:  [15-21] 15 (07/21 1330) BP: (161-176)/(98-100) 176/98 (07/21 1330) SpO2:  [98 %-99 %] 98 % (07/21 1330) Weight:  [43.4 kg] 43.4 kg (07/21 1228)   Constitutional: Appears well-developed and well-nourished.  Psych: Affect appropriate to situation Eyes: No scleral injection HENT: No OP obstrucion Head: Normocephalic.  Cardiovascular: Normal rate and regular rhythm.  Respiratory:  Effort normal, non-labored breathing GI: Soft.  No distension. There is no tenderness.  Skin: WDI  Neuro: Mental Status: Patient is awake, alert, oriented to person, place, month, year, and situation. Speech-is clear with no dysarthria or aphasia.  Naming, repeating and comprehension are intact.  Patient is able to follow commands without any difficulty.  Patient is a good historian. Cranial Nerves: II: Visual Fields are full.  III,IV, VI: EOMI without ptosis or diploplia. Pupils equal, round and reactive to light V: Facial sensation is symmetric to temperature VII: Facial movement is symmetric.  VIII: hearing is intact to voice X: Palat elevates symmetrically XI: Shoulder shrug is symmetric. XII: tongue is midline without atrophy or fasciculations.  Motor: Patient does exhibit left tricep extension 4/5 and left straight leg rise 4/5.  Her left leg is mostly secondary to pain.  All other extremities is 5/5 No drift  Sensory: Sensation is symmetric to light touch and temperature in the arms and legs. DSS Deep Tendon Reflexes: 2+ and symmetric in the biceps and patellae.  Plantars: Toes are downgoing bilaterally.  Cerebellar: FNF and HKS are intact bilaterally  Labs I have reviewed labs in epic and the results pertinent to this consultation are:   CBC    Component Value Date/Time    WBC 7.1 06/19/2020 1248   RBC 4.00 06/19/2020 1248   HGB 12.7 06/19/2020 1248   HCT 39.4 06/19/2020 1248   PLT 471 (H) 06/19/2020 1248   MCV 98.5 06/19/2020 1248   MCH 31.8 06/19/2020 1248   MCHC 32.2 06/19/2020 1248   RDW 13.4 06/19/2020 1248   LYMPHSABS 1.3 06/19/2020 1248   MONOABS 0.7 06/19/2020 1248   EOSABS 0.4 06/19/2020 1248   BASOSABS 0.1 06/19/2020 1248    CMP     Component Value Date/Time   NA 130 (L) 06/19/2020 1248   NA 130 (A) 04/17/2020 0000   K 4.3 06/19/2020 1248   CL 97 (L) 06/19/2020 1248   CO2 23 06/19/2020 1248   GLUCOSE 109 (H) 06/19/2020 1248   BUN 20 06/19/2020 1248   BUN 10 04/17/2020 0000   CREATININE 0.71 06/19/2020 1248   CREATININE 0.67 06/13/2020 0700   CALCIUM 9.8 06/19/2020 1248   PROT 6.9 06/19/2020 1248   PROT 6.7 03/30/2018 1155   ALBUMIN 3.7 06/19/2020 1248   AST 24 06/19/2020 1248   ALT 14 06/19/2020 1248   ALKPHOS 55 06/19/2020 1248   BILITOT 0.6 06/19/2020 1248   GFRNONAA >60 06/19/2020 1248   GFRNONAA 79 06/13/2020 0700   GFRAA >60 06/19/2020 1248   GFRAA 92 06/13/2020 0700    Lipid Panel     Component Value Date/Time   CHOL 181 06/13/2020 0700   TRIG 117 06/13/2020 0700   HDL 67 06/13/2020 0700   CHOLHDL 2.7 06/13/2020 0700   LDLCALC 93 06/13/2020 0700     Imaging I have reviewed the images obtained:   Etta Quill PA-C [facility] [phone]  M-F  (9:00 am- 5:00 PM)  06/19/2020, 2:36 PM     Assessment:  86 year old female presented to the hospital with 5-minute transient episode of left arm weakness and left leg weakness.  On exam at this point time she still has left tricep extension weakness.  Given exam she most likely has suffered from a stroke.  No likely small vessel.  Impression: -Stroke  Recommend -MRI of the brain without contrast -CTA head neck -Transthoracic Echo -Start patient on ASA 325mg  daily  -Start or continue Atorvastatin 80 mg/other  high intensity statin -BP goal:  permissive HTN upto 220/120 mmHg -HBAIC and Lipid profile -Telemetry monitoring -Frequent neuro checks -NPO until passes stroke swallow screen -PT/OT # please page stroke NP  Or  PA  Or MD from 8am -4 pm  as this patient from this time will be  followed by the stroke.   You can look them up on [email or web address]  Password [email or web address]

## 2020-06-19 NOTE — ED Provider Notes (Signed)
[facility] EMERGENCY DEPARTMENT Provider Note   CSN: [id number] Arrival date & time: 06/19/20  1223     History Chief Complaint  Patient presents with  . Extremity Weakness    Sara Davila is a 86 y.o. female.  HPI  Patient is an 86 year old female with a past medical history significant for mild memory loss, and anxiety is presented today for symptoms of disequilibrium.  Patient denies any history of stroke or TIA.  She may have told EMS earlier about a TIA history and this was entered into her chart however she denies this with me.  She states she has significant left hip pain after a broken femur in April and has been in independent living using a walker since then and states that at approximately 11 AM she was in a chair when she attempted to get up in order to use the restroom and felt that she could not tell where her arm was in space.  She states that she reached for her walker several times but had to look at the walker and her hand in order to make the to connect.  She denies any weakness or numbness in her arm but states that it felt that it was "not attached to me ".  She states she has never had symptoms like this in the past.  She states that she did not speak during this episode and therefore does not know she has slurred speech but feels that she remembers the episode well.  She states that she was able to make it to the bathroom although she felt "off "she states that she does not notice any leg weakness although she states that she has significant left leg weakness from her fractured hip.  She is able to make it to the bathroom, use the restroom and wipe without difficulty and returned to her seat but noticed that it was not until she returned to her seat that her left arm felt back to normal.  She states it lasted approximately 5 minutes and resolved completely.  Patient denies any sx currently besides right sided headache that is mild and without hx of  trauma, photophobia, NV, fever neck pain or stiffness.   She states she has some memory issues but denies any other neurologic history.  At this point EMS was on their way.  She states that she does not feel that she has residual weakness other than weakness secondary to the pain in her left hip.  She states she does not take any blood thinners or aspirin on a daily basis.  She states that she is currently moving from independent living to assisted living in the same facility but is requiring her to move from a 2 bedroom to a 1 room apartment.  She states she is very stressed with the move and feels somewhat anxious.  She is requesting lorazepam and states that she takes this for anxiety.     Past Medical History:  Diagnosis Date  . Abdominal pain 02/26/2017  . Anxiety   . Arthritis    Left Knee, Wrist, Back and Hips  . Cervical vertebral fusion   . Diverticulitis   . Diverticulosis   . Fibromyalgia   . GERD (gastroesophageal reflux disease)   . Gout 02/2018   L hand  . Hypothyroidism   . Pelvis fracture ([place])   . Peritoneal free air 03/29/2012  . Pneumonia   . Pneumoperitoneum 02/26/2017  . Thyroid disorder   . TIA (  transient ischemic attack)   . Toe pain 09/06/2016    Patient Active Problem List   Diagnosis Date Noted  . CVA (cerebral vascular accident) ([place]) 06/19/2020  . Asymptomatic bacteriuria 06/19/2020  . HTN (hypertension), benign 06/19/2020  . Thrombocytosis ([place]) 06/19/2020  . Left hip pain 04/15/2020  . Left knee pain 03/20/2020  . Slow transit constipation 03/20/2020  . Left foot pain 03/18/2020  . Urinary retention 03/06/2020  . Blood loss anemia 03/06/2020  . Displaced intertrochanteric fracture of left femur, initial encounter for closed fracture ([place]) 03/03/2020  . Prediabetes 01/04/2020  . Memory deficit 01/04/2020  . Osteoporosis 01/04/2020  . History of vasculitis 01/04/2020  . Venous insufficiency 01/04/2020  . Neuropathy 03/30/2018  . Hyponatremia  07/21/2017  . Insomnia secondary to anxiety 07/21/2017  . GERD (gastroesophageal reflux disease) 07/21/2017  . S/P small bowel resection 07/09/2017  . Hypothyroidism 02/26/2017  . Paresthesias 09/06/2016  . Fall 03/29/2012    Past Surgical History:  Procedure Laterality Date  . ABDOMINAL HYSTERECTOMY    . BOWEL RESECTION N/A 07/09/2017   Procedure: SMALL BOWEL RESECTION;  Surgeon: Johnathan Hausen, MD;  Location: [facility];  Service: General;  Laterality: N/A;  . CERVICAL FUSION     x2  . FEMUR IM NAIL Left 03/03/2020   Procedure: INTRAMEDULLARY (IM) NAIL FEMORAL;  Surgeon: Paralee Cancel, MD;  Location: [facility];  Service: Orthopedics;  Laterality: Left;  . LAPAROSCOPIC APPENDECTOMY N/A 03/04/2017   Procedure: APPENDECTOMY LAPAROSCOPIC;  Surgeon: Johnathan Hausen, MD;  Location: [facility];  Service: General;  Laterality: N/A;  . LAPAROSCOPY N/A 03/04/2017   Procedure: LAPAROSCOPY DIAGNOSTIC, ENTEROLYSIS;  Surgeon: Johnathan Hausen, MD;  Location: [facility];  Service: General;  Laterality: N/A;  . LAPAROTOMY N/A 07/09/2017   Procedure: EXPLORATORY LAPAROTOMY;  Surgeon: Johnathan Hausen, MD;  Location: [facility];  Service: General;  Laterality: N/A;  . SPINE SURGERY     Lumbar- rod placement  . TONSILLECTOMY     86y/o  . TOTAL VAGINAL HYSTERECTOMY       OB History   No obstetric history on file.     Family History  Problem Relation Age of Onset  . Asthma Mother   . Pulmonary embolism Mother   . Macular degeneration Mother   . Heart disease Father   . Stroke Father   . Stroke Paternal Uncle   . Breast cancer Maternal Aunt   . Macular degeneration Sister   . Stroke Sister   . Neuropathy Neg Hx     Social History   Tobacco Use  . Smoking status: Former Research scientist (life sciences)  . Smokeless tobacco: Never Used  . Tobacco comment: Smoked from age [age]  Vaping Use  . Vaping Use: Never used  Substance Use Topics  . Alcohol use: Yes    Comment: occasional glass of wine  . Drug use: No    Home  Medications Prior to Admission medications   Medication Sig Start Date End Date Taking? Authorizing Provider  Acetaminophen (TYLENOL EXTRA STRENGTH PO) Take 500 mg by mouth in the morning and at bedtime. 2 tabs daily   Yes [provider]  Apoaequorin (PREVAGEN PO) Take 10 mg by mouth daily.    Yes [provider]  Calcium Carbonate-Vitamin D (CALCIUM 500/D PO) Take 500 mg by mouth daily.   Yes [provider]  Carboxymethylcellulose Sodium (THERATEARS OP) Place 1 drop into both eyes 2 (two) times daily.    Yes [provider]  diclofenac Sodium (VOLTAREN) 1 % GEL  Apply topically in the morning and at bedtime. To knee    Yes [provider]  hydrochlorothiazide (MICROZIDE) 12.5 MG capsule Take 12.5 mg by mouth 2 (two) times a week. Monday & Friday. 02/18/18  Yes [provider]  ibandronate (BONIVA) 150 MG tablet Take 150 mg by mouth every 30 (thirty) days. Take in the morning with a full glass of water, on an empty stomach, and do not take anything else by mouth or lie down for the next 30 min.   Yes [provider]  ibuprofen (ADVIL) 200 MG tablet Take 200 mg by mouth daily as needed for moderate pain.    Yes [provider]  levothyroxine (SYNTHROID) 75 MCG tablet Take 1 tablet (75 mcg total) by mouth daily before breakfast. 06/15/20  Yes [name], MD  LORazepam (ATIVAN) 0.5 MG tablet Take 0.5 tablets (0.25 mg total) by mouth 2 (two) times daily as needed for anxiety. 03/12/20  Yes [name], MD  naproxen sodium (ALEVE) 220 MG tablet Take 220 mg by mouth daily as needed (for pain).    Yes [provider]  omeprazole (PRILOSEC) 20 MG capsule Take 20 mg by mouth daily as needed.    Yes [provider]  polyethylene glycol (MIRALAX / GLYCOLAX) 17 g packet Take 17 g by mouth daily as needed.   Yes [provider]  polyethylene glycol (MIRALAX / GLYCOLAX) 17 g packet Take 17 g by mouth daily.  03/05/20  Yes Shelly Coss, MD  saccharomyces boulardii (FLORASTOR) 250 MG capsule Take 250 mg by mouth daily.    Yes [provider]  senna-docusate (SENOKOT-S) 8.6-50 MG tablet Take 2 tablets by mouth 2 (two) times daily. 03/05/20  Yes Shelly Coss, MD  traMADol (ULTRAM) 50 MG tablet Take 50 mg by mouth every 6 (six) hours as needed for moderate pain.    Yes [provider]  Vitamin D, Cholecalciferol, 50 MCG (2000 UT) CAPS Take 1 tablet by mouth daily.   Yes [provider]  zolpidem (AMBIEN) 5 MG tablet Take 2.5 mg by mouth at bedtime as needed for sleep.   Yes [provider]    Allergies    Codeine, Penicillins, and Levaquin [levofloxacin]  Review of Systems   Review of Systems  Constitutional: Negative for chills and fever.  HENT: Negative for congestion.   Eyes: Negative for pain.  Respiratory: Negative for cough and shortness of breath.   Cardiovascular: Negative for chest pain and leg swelling.  Gastrointestinal: Negative for abdominal pain and vomiting.  Genitourinary: Negative for dysuria.  Musculoskeletal: Negative for myalgias.       Left hip pain  Skin: Negative for rash.  Neurological: Positive for headaches. Negative for dizziness.    Physical Exam Updated Vital Signs BP 125/73 (BP Location: Left Arm)   Pulse 81   Temp (!) 97.4 F (36.3 C) (Oral)   Resp 18   Ht 5' (1.524 m)   Wt 41.6 kg   SpO2 96%   BMI 17.91 kg/m   Physical Exam Vitals and nursing note reviewed.  Constitutional:      General: She is not in acute distress.    Appearance: Normal appearance. She is not ill-appearing.     Comments: Pleasant 86 year old female no acute distress sitting comfortably in bed  HENT:     Head: Normocephalic and atraumatic.  Eyes:     General: No scleral icterus.       Right eye: No discharge.  Left eye: No discharge.     Conjunctiva/sclera: Conjunctivae normal.  Pulmonary:     Effort: Pulmonary effort is normal.      Breath sounds: No stridor.  Neurological:     Mental Status: She is alert and oriented to person, place, and time. Mental status is at baseline.     Comments: Alert and oriented to self, place, time and event.   Speech is fluent, clear without dysarthria or dysphasia.   Strength 5/5 in upper/lower extremities in the triceps and biceps and in BL grip. There is some weakness in left thigh flexion likely 2/2 pain   Sensation intact in upper/lower extremities   Gait deferred - pt is unsteady at baseline. No pronator drift.  Normal finger-to-nose and feet tapping and heel to shin.  CN I not tested  CN II grossly intact visual fields bilaterally. Did not visualize posterior eye.   CN III, IV, VI PERRLA and EOMs intact bilaterally  CN V Intact sensation to sharp and light touch to the face  CN VII facial movements symmetric  CN VIII not tested  CN IX, X no uvula deviation, symmetric rise of soft palate  CN XI 5/5 SCM and trapezius strength bilaterally  CN XII Midline tongue protrusion, symmetric L/R movements      ED Results / Procedures / Treatments   Labs (all labs ordered are listed, but only abnormal results are displayed) Labs Reviewed  COMPREHENSIVE METABOLIC PANEL - Abnormal; Notable for the following components:      Result Value   Sodium 130 (*)    Chloride 97 (*)    Glucose, Bld 109 (*)    All other components within normal limits  URINALYSIS, ROUTINE W REFLEX MICROSCOPIC - Abnormal; Notable for the following components:   APPearance HAZY (*)    Leukocytes,Ua TRACE (*)    Bacteria, UA RARE (*)    All other components within normal limits  CBC WITH DIFFERENTIAL/PLATELET - Abnormal; Notable for the following components:   Platelets 471 (*)    All other components within normal limits  CBC - Abnormal; Notable for the following components:   Platelets 418 (*)    All other components within normal limits  SARS CORONAVIRUS 2 BY RT PCR (HOSPITAL ORDER, [facility])  URINE CULTURE  PROTIME-INR  APTT  CREATININE, SERUM  TSH  HEMOGLOBIN A1C  LIPID PANEL  CBC WITH DIFFERENTIAL/PLATELET  BASIC METABOLIC PANEL    EKG EKG Interpretation  Date/Time:  Wednesday June 19 2020 13:13:32 EDT Ventricular Rate:  84 PR Interval:    QRS Duration: 76 QT Interval:  365 QTC Calculation: 432 R Axis:   57 Text Interpretation: Sinus rhythm Ventricular trigeminy Probable left atrial enlargement Since prior ECG, nonspecific changes lateral leads Confirmed by Gareth Morgan [phone]) on 06/19/2020 1:27:32 PM   Radiology CT Angio Head W or Wo Contrast  Result Date: 06/19/2020 CLINICAL DATA:  Extremity weakness.  Stroke suspected. EXAM: CT ANGIOGRAPHY HEAD AND NECK TECHNIQUE: Multidetector CT imaging of the head and neck was performed using the standard protocol during bolus administration of intravenous contrast. Multiplanar CT image reconstructions and MIPs were obtained to evaluate the vascular anatomy. Carotid stenosis measurements (when applicable) are obtained utilizing NASCET criteria, using the distal internal carotid diameter as the denominator. CONTRAST:  64mL OMNIPAQUE IOHEXOL 350 MG/ML SOLN COMPARISON:  CT head without contrast 06/19/2020 and 03/02/2020. FINDINGS: CTA NECK FINDINGS Aortic arch: Common origin of the left common carotid artery and innominate artery  is noted. No significant calcifications are present at the aortic arch. No aneurysm or stenosis is present. Right carotid system: The right common carotid artery is within normal limits. Bifurcation is unremarkable. Mild tortuosity is present cervical right ICA without significant stenosis. Left carotid system: The left common carotid artery is within normal limits. Minimal calcifications present bifurcation without significant stenosis. Mild tortuosity is present cervical left ICA without significant stenosis. Vertebral arteries: The vertebral arteries originate from the subclavian  arteries bilaterally without significant stenosis. The left vertebral artery is the dominant vessel. No significant stenosis present in the neck. Skeleton: Cervical spine is fused C4-6 probable fusion is present at C6-7. Grade 2 anterolisthesis at C7-T1 measures 5 cm. Fusion is present at T1-2 and likely T2-3. Other neck: Soft tissues the neck are otherwise unremarkable. Upper chest: Scarring is noted at the lung apices bilaterally. Lungs are otherwise clear. Thoracic inlet is within normal limits. Review of the MIP images confirms the above findings CTA HEAD FINDINGS Anterior circulation: The internal carotid arteries are within normal limits through the ICA termini bilaterally. The A1 and M1 segments are normal. The A1 and M1 segments are normal. The anterior communicating artery is patent. The MCA bifurcations are intact bilaterally. ACA and MCA branch vessels are unremarkable. Posterior circulation: Left vertebral artery is dominant. PICA origins are visualized and normal. The vertebrobasilar junction is normal. Basilar artery is normal. Both posterior cerebral arteries originate from the basilar tip. The PCA branch vessels are within normal limits. Venous sinuses: The dural sinuses are patent. The straight sinus and deep cerebral veins are intact. Cortical veins are unremarkable. Anatomic variants: None Review of the MIP images confirms the above findings IMPRESSION: 1. No emergent large vessel occlusion. 2. Minimal atherosclerotic changes at the left carotid bifurcation without significant stenosis. 3. Mild tortuosity of the cervical internal carotid arteries bilaterally without significant stenosis. 4. Normal variant Circle of Willis without significant proximal stenosis, aneurysm, or branch vessel occlusion. 5. Grade 2 anterolisthesis at C7-T1 measures 5 cm. The cervical spine is fused above this level. Electronically Signed   By: San Morelle M.D.   On: 06/19/2020 17:04   CT Head Wo  Contrast  Result Date: 06/19/2020 CLINICAL DATA:  Extremity weakness EXAM: CT HEAD WITHOUT CONTRAST TECHNIQUE: Contiguous axial images were obtained from the base of the skull through the vertex without intravenous contrast. COMPARISON:  CT 03/02/2020 FINDINGS: Brain: No evidence of acute infarction, hemorrhage, hydrocephalus, extra-axial collection or mass lesion/mass effect. Symmetric prominence of the ventricles, cisterns and sulci compatible with parenchymal volume loss. Patchy areas of white matter hypoattenuation are most compatible with chronic microvascular angiopathy. Vascular: Atherosclerotic calcification of the carotid siphons. No hyperdense vessel. Skull: No calvarial fracture or suspicious osseous lesion. No scalp swelling or hematoma. Sinuses/Orbits: Paranasal sinuses and mastoid air cells are predominantly clear. Orbital structures are unremarkable aside from prior lens extractions. Other: None. IMPRESSION: 1. No acute intracranial findings. 2. Chronic microvascular angiopathy and parenchymal volume loss. Electronically Signed   By: Lovena Le M.D.   On: 06/19/2020 15:10   CT Angio Neck W and/or Wo Contrast  Result Date: 06/19/2020 CLINICAL DATA:  Extremity weakness.  Stroke suspected. EXAM: CT ANGIOGRAPHY HEAD AND NECK TECHNIQUE: Multidetector CT imaging of the head and neck was performed using the standard protocol during bolus administration of intravenous contrast. Multiplanar CT image reconstructions and MIPs were obtained to evaluate the vascular anatomy. Carotid stenosis measurements (when applicable) are obtained utilizing NASCET criteria, using the distal internal carotid diameter  as the denominator. CONTRAST:  30mL OMNIPAQUE IOHEXOL 350 MG/ML SOLN COMPARISON:  CT head without contrast 06/19/2020 and 03/02/2020. FINDINGS: CTA NECK FINDINGS Aortic arch: Common origin of the left common carotid artery and innominate artery is noted. No significant calcifications are present at the  aortic arch. No aneurysm or stenosis is present. Right carotid system: The right common carotid artery is within normal limits. Bifurcation is unremarkable. Mild tortuosity is present cervical right ICA without significant stenosis. Left carotid system: The left common carotid artery is within normal limits. Minimal calcifications present bifurcation without significant stenosis. Mild tortuosity is present cervical left ICA without significant stenosis. Vertebral arteries: The vertebral arteries originate from the subclavian arteries bilaterally without significant stenosis. The left vertebral artery is the dominant vessel. No significant stenosis present in the neck. Skeleton: Cervical spine is fused C4-6 probable fusion is present at C6-7. Grade 2 anterolisthesis at C7-T1 measures 5 cm. Fusion is present at T1-2 and likely T2-3. Other neck: Soft tissues the neck are otherwise unremarkable. Upper chest: Scarring is noted at the lung apices bilaterally. Lungs are otherwise clear. Thoracic inlet is within normal limits. Review of the MIP images confirms the above findings CTA HEAD FINDINGS Anterior circulation: The internal carotid arteries are within normal limits through the ICA termini bilaterally. The A1 and M1 segments are normal. The A1 and M1 segments are normal. The anterior communicating artery is patent. The MCA bifurcations are intact bilaterally. ACA and MCA branch vessels are unremarkable. Posterior circulation: Left vertebral artery is dominant. PICA origins are visualized and normal. The vertebrobasilar junction is normal. Basilar artery is normal. Both posterior cerebral arteries originate from the basilar tip. The PCA branch vessels are within normal limits. Venous sinuses: The dural sinuses are patent. The straight sinus and deep cerebral veins are intact. Cortical veins are unremarkable. Anatomic variants: None Review of the MIP images confirms the above findings IMPRESSION: 1. No emergent large  vessel occlusion. 2. Minimal atherosclerotic changes at the left carotid bifurcation without significant stenosis. 3. Mild tortuosity of the cervical internal carotid arteries bilaterally without significant stenosis. 4. Normal variant Circle of Willis without significant proximal stenosis, aneurysm, or branch vessel occlusion. 5. Grade 2 anterolisthesis at C7-T1 measures 5 cm. The cervical spine is fused above this level. Electronically Signed   By: San Morelle M.D.   On: 06/19/2020 17:04   MR BRAIN WO CONTRAST  Result Date: 06/19/2020 CLINICAL DATA:  Follow-up examination for acute stroke. EXAM: MRI HEAD WITHOUT CONTRAST TECHNIQUE: Multiplanar, multiecho pulse sequences of the brain and surrounding structures were obtained without intravenous contrast. COMPARISON:  Prior CTs from earlier the same day. FINDINGS: Brain: Cerebral volume within normal limits for age. Mild scattered T2/FLAIR hyperintensity seen involving the periventricular deep white matter both cerebral hemispheres, most like related chronic microvascular ischemic disease, fairly typical/normal for age. No abnormal foci of restricted diffusion to suggest acute or subacute ischemia. Gray-white matter differentiation maintained. No encephalomalacia to suggest chronic cortical infarction. No foci of susceptibility artifact to suggest acute or chronic intracranial hemorrhage. No mass lesion, midline shift or mass effect. No hydrocephalus or extra-axial fluid collection. Pituitary gland suprasellar region normal. Midline structures intact. Vascular: Major intracranial vascular flow voids are maintained. Skull and upper cervical spine: Degenerative osteoarthritic changes noted about the dens. Craniocervical junction otherwise unremarkable. Bone marrow signal intensity within normal limits. No scalp soft tissue abnormality. Sinuses/Orbits: Patient status post bilateral ocular lens replacement. Globes and orbital soft tissues demonstrate no  acute finding. Paranasal sinuses  are largely clear. No significant mastoid effusion. Inner ear structures grossly normal. Other: None. IMPRESSION: Normal brain MRI for age. No acute intracranial infarct or other abnormality. Electronically Signed   By: Jeannine Boga M.D.   On: 06/19/2020 20:22    Procedures Procedures (including critical care time)  Medications Ordered in ED Medications   stroke: mapping our early stages of recovery book (has no administration in time range)  acetaminophen (TYLENOL) tablet 650 mg (650 mg Oral Given 06/19/20 2156)    Or  acetaminophen (TYLENOL) 160 MG/5ML solution 650 mg ( Per Tube See Alternative 06/19/20 2156)    Or  acetaminophen (TYLENOL) suppository 650 mg ( Rectal See Alternative 06/19/20 2156)  aspirin suppository 300 mg ( Rectal See Alternative 06/19/20 1715)    Or  aspirin tablet 325 mg (325 mg Oral Given 06/19/20 1715)  LORazepam (ATIVAN) tablet 0.25 mg (has no administration in time range)  zolpidem (AMBIEN) tablet 2.5 mg (2.5 mg Oral Given 06/19/20 2156)  levothyroxine (SYNTHROID) tablet 75 mcg (has no administration in time range)  pantoprazole (PROTONIX) EC tablet 40 mg (40 mg Oral Not Given 06/19/20 1718)  polyethylene glycol (MIRALAX / GLYCOLAX) packet 17 g (has no administration in time range)  senna-docusate (Senokot-S) tablet 2 tablet (2 tablets Oral Not Given 06/19/20 2112)  atorvastatin (LIPITOR) tablet 80 mg (80 mg Oral Not Given 06/19/20 1718)  enoxaparin (LOVENOX) injection 30 mg (has no administration in time range)  iohexol (OMNIPAQUE) 350 MG/ML injection 50 mL (50 mLs Intravenous Contrast Given 06/19/20 1639)    ED Course  I have reviewed the triage vital signs and the nursing notes.  Pertinent labs & imaging results that were available during my care of the patient were reviewed by me and considered in my medical decision making (see chart for details).  Clinical Course as of Jun 20 150  Thu Jun 20, 2020  0137 Creatinine:  0.71 [WF]    Clinical Course User Index [WF] Tedd Sias, [place]   MDM Rules/Calculators/A&P                          Patient is an 86 year old female with past medical history of no stroke or TIA presented today with mildly elevated blood pressure and complaints of transient left arm disequilibrium with questionable weakness.  She tells me she does not feel that she was weak in her left arm however she had some trouble grasping the hand of her walker.  Physical exam is notable for no neurologic abnormalities.  She has good strength in all 4 extremities.  Because of her symptoms I have concern for TIA versus this being a stress related conversion disorder with quick resolution.  I consulted neurology to discuss her case and Dr. Milas Gain and neurology PA assessed patient at bedside.  They noted some left tricep weakness and recommended admission for TIA work-up/stroke work-up.  CT angiography of head and neck and MRI brain ordered and pending at time of admission.  I discussed with Dr. Doristine Bosworth of [facility] hospitalist.  Will admit patient to hospital.  Patient CBC is without leukocytosis or anemia.  Mildly elevated platelets likely due to some mild dehydration.  CMP notable for mildly hyponatremic hypochloremic findings although patient does not appear to be symptomatic from the standpoint.  I doubt the very mild hyponatremia is contributing to her symptoms today.  Urinalysis with trace leukocytes and rare bacteria she denies any urinary symptoms at all.  Doubt urinary tract  infection.  Covid swab is negative.  EKG with no acute ischemia or notable abnormalities.  CT head without contrast without any acute abnormalities.  Patient is moved to hospitalist with neurology as consultants-MRI brain without contrast, CT angiography head and neck are pending at this time.   1725 -- No evidence of stroke on angiography. MR Brain pending.   2055 -- No evidence of stroke on MR brain.  Final Clinical  Impression(s) / ED Diagnoses Final diagnoses:  TIA (transient ischemic attack)    Rx / DC Orders ED Discharge Orders    None       Tedd Sias, [place] 06/20/20 Margarita Rana, MD 06/21/20 [phone]

## 2020-06-20 ENCOUNTER — Inpatient Hospital Stay (HOSPITAL_BASED_OUTPATIENT_CLINIC_OR_DEPARTMENT_OTHER): Payer: Medicare Other

## 2020-06-20 DIAGNOSIS — I639 Cerebral infarction, unspecified: Secondary | ICD-10-CM | POA: Diagnosis not present

## 2020-06-20 DIAGNOSIS — R531 Weakness: Secondary | ICD-10-CM | POA: Diagnosis not present

## 2020-06-20 DIAGNOSIS — G459 Transient cerebral ischemic attack, unspecified: Secondary | ICD-10-CM | POA: Diagnosis present

## 2020-06-20 DIAGNOSIS — I6389 Other cerebral infarction: Secondary | ICD-10-CM | POA: Diagnosis not present

## 2020-06-20 LAB — CBC WITH DIFFERENTIAL/PLATELET
Abs Immature Granulocytes: 0.03 10*3/uL (ref 0.00–0.07)
Basophils Absolute: 0.1 10*3/uL (ref 0.0–0.1)
Basophils Relative: 1 %
Eosinophils Absolute: 0.7 10*3/uL — ABNORMAL HIGH (ref 0.0–0.5)
Eosinophils Relative: 10 %
HCT: 36.6 % (ref 36.0–46.0)
Hemoglobin: 12 g/dL (ref 12.0–15.0)
Immature Granulocytes: 1 %
Lymphocytes Relative: 20 %
Lymphs Abs: 1.3 10*3/uL (ref 0.7–4.0)
MCH: 31.4 pg (ref 26.0–34.0)
MCHC: 32.8 g/dL (ref 30.0–36.0)
MCV: 95.8 fL (ref 80.0–100.0)
Monocytes Absolute: 0.7 10*3/uL (ref 0.1–1.0)
Monocytes Relative: 11 %
Neutro Abs: 3.6 10*3/uL (ref 1.7–7.7)
Neutrophils Relative %: 57 %
Platelets: 397 10*3/uL (ref 150–400)
RBC: 3.82 MIL/uL — ABNORMAL LOW (ref 3.87–5.11)
RDW: 13.5 % (ref 11.5–15.5)
WBC: 6.3 10*3/uL (ref 4.0–10.5)
nRBC: 0 % (ref 0.0–0.2)

## 2020-06-20 LAB — BASIC METABOLIC PANEL
Anion gap: 7 (ref 5–15)
BUN: 12 mg/dL (ref 8–23)
CO2: 25 mmol/L (ref 22–32)
Calcium: 9 mg/dL (ref 8.9–10.3)
Chloride: 100 mmol/L (ref 98–111)
Creatinine, Ser: 0.65 mg/dL (ref 0.44–1.00)
GFR calc Af Amer: >60 mL/min (ref >60)
GFR calc non Af Amer: >60 mL/min (ref >60)
Glucose, Bld: 99 mg/dL (ref 70–99)
Potassium: 3.9 mmol/L (ref 3.5–5.1)
Sodium: 132 mmol/L — ABNORMAL LOW (ref 135–145)

## 2020-06-20 LAB — URINE CULTURE

## 2020-06-20 LAB — LIPID PANEL
Cholesterol: 171 mg/dL (ref 0–200)
HDL: 59 mg/dL (ref >40)
LDL Cholesterol: 94 mg/dL (ref 0–99)
Total CHOL/HDL Ratio: 2.9 RATIO
Triglycerides: 89 mg/dL (ref <150)
VLDL: 18 mg/dL (ref 0–40)

## 2020-06-20 LAB — HEMOGLOBIN A1C
Hgb A1c MFr Bld: 6.1 % — ABNORMAL HIGH (ref 4.8–5.6)
Mean Plasma Glucose: 128.37 mg/dL

## 2020-06-20 LAB — ECHOCARDIOGRAM COMPLETE
Area-P 1/2: 3.77 cm2
Calc EF: 61.7 %
Height: 60 in
S' Lateral: 2.3 cm
Single Plane A2C EF: 57.4 %
Single Plane A4C EF: 64.9 %
Weight: 1467.38 oz

## 2020-06-20 MED ORDER — ATORVASTATIN CALCIUM 10 MG PO TABS: 20.0000 mg | ORAL_TABLET | Freq: Every day | ORAL | Status: DC

## 2020-06-20 MED ORDER — ASPIRIN EC 81 MG PO TBEC: 81.0000 mg | DELAYED_RELEASE_TABLET | Freq: Every day | ORAL | Status: DC

## 2020-06-20 MED ORDER — CLOPIDOGREL BISULFATE 75 MG PO TABS
75.0000 mg | ORAL_TABLET | Freq: Every day | ORAL | Status: DC
  Administered 2020-06-20: 75 mg via ORAL
  Filled 2020-06-20: qty 1

## 2020-06-20 MED ORDER — ATORVASTATIN CALCIUM 20 MG PO TABS: 20.0000 mg | ORAL_TABLET | Freq: Every day | ORAL | 0 refills | Status: DC

## 2020-06-20 MED ORDER — AMLODIPINE BESYLATE 2.5 MG PO TABS
2.5000 mg | ORAL_TABLET | Freq: Every day | ORAL | Status: DC
  Administered 2020-06-20: 2.5 mg via ORAL
  Filled 2020-06-20: qty 1

## 2020-06-20 MED ORDER — ASPIRIN 81 MG PO TBEC: 81.0000 mg | DELAYED_RELEASE_TABLET | Freq: Every day | ORAL | 0 refills | Status: DC

## 2020-06-20 MED ORDER — STROKE: EARLY STAGES OF RECOVERY BOOK: 1.0000 | Freq: Once | 0 refills | Status: AC

## 2020-06-20 MED ORDER — CLOPIDOGREL BISULFATE 75 MG PO TABS: 75.0000 mg | ORAL_TABLET | Freq: Every day | ORAL | 0 refills | Status: DC

## 2020-06-20 MED ORDER — AMLODIPINE BESYLATE 2.5 MG PO TABS: 2.5000 mg | ORAL_TABLET | Freq: Every day | ORAL | 0 refills | Status: DC

## 2020-06-20 NOTE — Discharge Summary (Addendum)
Discharge Summary  Sara Davila KWI:[id number] DOB: 1930-10-03  PCP: [name], MD  Admit date: 06/19/2020 Discharge date: 06/20/2020  Time spent: 35 minutes  Recommendations for Outpatient Follow-up:  1. Follow-up with neurology 2. Follow up with your PCP 3. Take your medications as prescribed 4. Fall precautions  Discharge Diagnoses:  Active Hospital Problems   Diagnosis Date Noted  . CVA (cerebral vascular accident) ([place]) 06/19/2020  . Asymptomatic bacteriuria 06/19/2020  . HTN (hypertension), benign 06/19/2020  . Thrombocytosis ([place]) 06/19/2020  . Hyponatremia 07/21/2017  . Hypothyroidism 02/26/2017    Resolved Hospital Problems  No resolved problems to display.    Discharge Condition: Stable  Diet recommendation: Heart healthy diet.  Vitals:   06/20/20 0434 06/20/20 1310  BP: 135/80 119/85  Pulse: 79 84  Resp: 18 18  Temp: 98 F (36.7 C) 98 F (36.7 C)  SpO2: 95% 97%    History of present illness:  MEQ:ASTMHD R Piperis a 86 y.o.femalewith medical history significant ofhypothyroidism, GERD, neuropathy, osteoporosis, insomnia, chronic anxiety who presents to emergency department due to left-sided weakness.  Around 11 AM she was unable to get up from the chair due to left-sided weakness. Reports that she is unable to hold anything in her left hand due to weakness. Reports that her symptoms have resolved now.  CT head negative for any acute intracranial findings. Neurology recommended [facility] hospitalist admission for stroke work-up.   MRI brain no acute abnormality.  LDL elevated 94 with a goal of less than 70, started on Lipitor 20 mg daily.  06/20/20: Seen and examined.  No acute events overnight.  She has no new complaints.  Her symptoms have completely resolved.  Seen by neurology, okay to discharge, no further work-up.  Hospital Course:  Principal Problem:   CVA (cerebral vascular accident) [facility]) Active Problems:   Hypothyroidism    Hyponatremia   Asymptomatic bacteriuria   HTN (hypertension), benign   Thrombocytosis ([facility])  TIA -Presented with left-sided weakness-her symptoms resolved upon arrival. CT head negative for any acute intracranial findings. Neurology recommended [facility] hospitalist admission for stroke work-up.  MRI brain no acute abnormality. Continue aspirin 81 mg and Plavix 75 mg x 3 weeks, then aspirin alone. LDL elevated 94 with a goal of less than 70, started on Lipitor 20 mg daily. Hemoglobin A1c 6.1, at goal, less than 7.0. Assessed by PT OT with no further recommendations. 2D echo pending Follow-up with neurology outpatient.  Essential hypertension:  Hold HCTZ due to hyponatremia  Switch to Norvasc 2.5 mg daily Follow-up with your PCP.   Hypothyroidism: TSH 2.3 Continue levothyroxine Follow-up with your PCP  Chronic anxiety with insomnia: Stable Continue home regimen Follow-up with your PCP  GERD:  Stable  Continue PPI  Chronic constipation:  Continue home bowel regimen  Left hip fracture status post left-sided intramedullary nailing of the femur on 03/03/2020 -Continue home regimen as needed.   Follow-up with your orthopedic surgeon  Hyponatremia suspect HCTZ related: Hold off HCTZ Switched with Norvasc 2.5 mg daily Follow with your PCP  Resolved thrombocytosis:  Platelet: 471k> 397K  Asymptomatic bacteriuria:  Patient denies urinary symptoms.  Denies dysuria again this morning She is afebrile with no leukocytosis.  No indication of antibiotics at this time. Follow-up with your PCP   Code Status:DNR-confirmed with the patient     Procedures:  2D echo  Consultations:  Neurology  Discharge Exam: BP 119/85 (BP Location: Right Arm)   Pulse 84   Temp 98 F (36.7 C) (  Oral)   Resp 18   Ht 5' (1.524 m)   Wt 41.6 kg   SpO2 97%   BMI 17.91 kg/m  . General: 86 y.o. year-old female well developed well nourished in no acute distress.  Alert and  oriented x3. . Cardiovascular: Regular rate and rhythm with no rubs or gallops.  No thyromegaly or JVD noted.   Marland Kitchen Respiratory: Clear to auscultation with no wheezes or rales. Good inspiratory effort. . Abdomen: Soft nontender nondistended with normal bowel sounds x4 quadrants. . Musculoskeletal: No lower extremity edema bilaterally. Marland Kitchen Psychiatry: Mood is appropriate for condition and setting  Discharge Instructions You were cared for by a hospitalist during your hospital stay. If you have any questions about your discharge medications or the care you received while you were in the hospital after you are discharged, you can call the unit and asked to speak with the hospitalist on call if the hospitalist that took care of you is not available. Once you are discharged, your primary care physician will handle any further medical issues. Please note that NO REFILLS for any discharge medications will be authorized once you are discharged, as it is imperative that you return to your primary care physician (or establish a relationship with a primary care physician if you do not have one) for your aftercare needs so that they can reassess your need for medications and monitor your lab values.  Discharge Instructions    Ambulatory referral to Neurology   Complete by: As directed    Follow up in TIA clinic at [facility] with Dr. Sarina Ill.     Allergies as of 06/20/2020      Reactions   Codeine Rash   Penicillins Rash   Has patient had a PCN reaction causing immediate rash, facial/tongue/throat swelling, SOB or lightheadedness with hypotension: No Has patient had a PCN reaction causing severe rash involving mucus membranes or skin necrosis: No Has patient had a PCN reaction that required hospitalization: No Has patient had a PCN reaction occurring within the last 10 years: No If all of the above answers are "NO", then may proceed with Cephalosporin use.   Levaquin [levofloxacin]         Medication List    STOP taking these medications   hydrochlorothiazide 12.5 MG capsule Commonly known as: MICROZIDE   naproxen sodium 220 MG tablet Commonly known as: ALEVE     TAKE these medications    stroke: mapping our early stages of recovery book Misc 1 each by Does not apply route once for 1 dose.   amLODipine 2.5 MG tablet Commonly known as: NORVASC Take 1 tablet (2.5 mg total) by mouth daily.   aspirin 81 MG EC tablet Take 1 tablet (81 mg total) by mouth daily. Swallow whole. Start taking on: June 21, 2020   atorvastatin 20 MG tablet Commonly known as: LIPITOR Take 1 tablet (20 mg total) by mouth daily. Start taking on: June 21, 2020   CALCIUM 500/D PO Take 500 mg by mouth daily.   clopidogrel 75 MG tablet Commonly known as: PLAVIX Take 1 tablet (75 mg total) by mouth daily for 21 days. Start taking on: June 21, 2020   diclofenac Sodium 1 % Gel Commonly known as: VOLTAREN Apply topically in the morning and at bedtime. To knee   ibandronate 150 MG tablet Commonly known as: BONIVA Take 150 mg by mouth every 30 (thirty) days. Take in the morning with a full glass of water, on an  empty stomach, and do not take anything else by mouth or lie down for the next 30 min.   ibuprofen 200 MG tablet Commonly known as: ADVIL Take 200 mg by mouth daily as needed for moderate pain.   levothyroxine 75 MCG tablet Commonly known as: SYNTHROID Take 1 tablet (75 mcg total) by mouth daily before breakfast.   LORazepam 0.5 MG tablet Commonly known as: Ativan Take 0.5 tablets (0.25 mg total) by mouth 2 (two) times daily as needed for anxiety.   omeprazole 20 MG capsule Commonly known as: PRILOSEC Take 20 mg by mouth daily as needed.   polyethylene glycol 17 g packet Commonly known as: MIRALAX / GLYCOLAX Take 17 g by mouth daily as needed. What changed: Another medication with the same name was removed. Continue taking this medication, and follow the directions  you see here.   PREVAGEN PO Take 10 mg by mouth daily.   saccharomyces boulardii 250 MG capsule Commonly known as: FLORASTOR Take 250 mg by mouth daily.   senna-docusate 8.6-50 MG tablet Commonly known as: Senokot-S Take 2 tablets by mouth 2 (two) times daily.   THERATEARS OP Place 1 drop into both eyes 2 (two) times daily.   traMADol 50 MG tablet Commonly known as: ULTRAM Take 50 mg by mouth every 6 (six) hours as needed for moderate pain.   TYLENOL EXTRA STRENGTH PO Take 500 mg by mouth in the morning and at bedtime. 2 tabs daily   Vitamin D (Cholecalciferol) 50 MCG (2000 UT) Caps Take 1 tablet by mouth daily.   zolpidem 5 MG tablet Commonly known as: AMBIEN Take 2.5 mg by mouth at bedtime as needed for sleep.      Allergies  Allergen Reactions  . Codeine Rash  . Penicillins Rash    Has patient had a PCN reaction causing immediate rash, facial/tongue/throat swelling, SOB or lightheadedness with hypotension: No Has patient had a PCN reaction causing severe rash involving mucus membranes or skin necrosis: No Has patient had a PCN reaction that required hospitalization: No Has patient had a PCN reaction occurring within the last 10 years: No If all of the above answers are "NO", then may proceed with Cephalosporin use.   [place] [Levofloxacin]     Follow-up Information    Melvenia Beam, MD Follow up in 4 week(s).   Specialty: Neurology Why: TIA clinic. office will call with appt date and time. Contact information: [address] [place] [address] [phone]        [name], MD. Call in 1 day(s).   Specialty: Internal Medicine Why: Please call for a post hospital follow-up appointment. Contact information: [place] [address] [phone]                The results of significant diagnostics from this hospitalization (including imaging, microbiology, ancillary and laboratory) are listed below for  reference.    Significant Diagnostic Studies: CT Angio Head W or Wo Contrast  Result Date: 06/19/2020 CLINICAL DATA:  Extremity weakness.  Stroke suspected. EXAM: CT ANGIOGRAPHY HEAD AND NECK TECHNIQUE: Multidetector CT imaging of the head and neck was performed using the standard protocol during bolus administration of intravenous contrast. Multiplanar CT image reconstructions and MIPs were obtained to evaluate the vascular anatomy. Carotid stenosis measurements (when applicable) are obtained utilizing NASCET criteria, using the distal internal carotid diameter as the denominator. CONTRAST:  13mL OMNIPAQUE IOHEXOL 350 MG/ML SOLN COMPARISON:  CT head without contrast 06/19/2020  and 03/02/2020. FINDINGS: CTA NECK FINDINGS Aortic arch: Common origin of the left common carotid artery and innominate artery is noted. No significant calcifications are present at the aortic arch. No aneurysm or stenosis is present. Right carotid system: The right common carotid artery is within normal limits. Bifurcation is unremarkable. Mild tortuosity is present cervical right ICA without significant stenosis. Left carotid system: The left common carotid artery is within normal limits. Minimal calcifications present bifurcation without significant stenosis. Mild tortuosity is present cervical left ICA without significant stenosis. Vertebral arteries: The vertebral arteries originate from the subclavian arteries bilaterally without significant stenosis. The left vertebral artery is the dominant vessel. No significant stenosis present in the neck. Skeleton: Cervical spine is fused C4-6 probable fusion is present at C6-7. Grade 2 anterolisthesis at C7-T1 measures 5 cm. Fusion is present at T1-2 and likely T2-3. Other neck: Soft tissues the neck are otherwise unremarkable. Upper chest: Scarring is noted at the lung apices bilaterally. Lungs are otherwise clear. Thoracic inlet is within normal limits. Review of the MIP images confirms  the above findings CTA HEAD FINDINGS Anterior circulation: The internal carotid arteries are within normal limits through the ICA termini bilaterally. The A1 and M1 segments are normal. The A1 and M1 segments are normal. The anterior communicating artery is patent. The MCA bifurcations are intact bilaterally. ACA and MCA branch vessels are unremarkable. Posterior circulation: Left vertebral artery is dominant. PICA origins are visualized and normal. The vertebrobasilar junction is normal. Basilar artery is normal. Both posterior cerebral arteries originate from the basilar tip. The PCA branch vessels are within normal limits. Venous sinuses: The dural sinuses are patent. The straight sinus and deep cerebral veins are intact. Cortical veins are unremarkable. Anatomic variants: None Review of the MIP images confirms the above findings IMPRESSION: 1. No emergent large vessel occlusion. 2. Minimal atherosclerotic changes at the left carotid bifurcation without significant stenosis. 3. Mild tortuosity of the cervical internal carotid arteries bilaterally without significant stenosis. 4. Normal variant Circle of Willis without significant proximal stenosis, aneurysm, or branch vessel occlusion. 5. Grade 2 anterolisthesis at C7-T1 measures 5 cm. The cervical spine is fused above this level. Electronically Signed   By: San Morelle M.D.   On: 06/19/2020 17:04   CT Head Wo Contrast  Result Date: 06/19/2020 CLINICAL DATA:  Extremity weakness EXAM: CT HEAD WITHOUT CONTRAST TECHNIQUE: Contiguous axial images were obtained from the base of the skull through the vertex without intravenous contrast. COMPARISON:  CT 03/02/2020 FINDINGS: Brain: No evidence of acute infarction, hemorrhage, hydrocephalus, extra-axial collection or mass lesion/mass effect. Symmetric prominence of the ventricles, cisterns and sulci compatible with parenchymal volume loss. Patchy areas of white matter hypoattenuation are most compatible with  chronic microvascular angiopathy. Vascular: Atherosclerotic calcification of the carotid siphons. No hyperdense vessel. Skull: No calvarial fracture or suspicious osseous lesion. No scalp swelling or hematoma. Sinuses/Orbits: Paranasal sinuses and mastoid air cells are predominantly clear. Orbital structures are unremarkable aside from prior lens extractions. Other: None. IMPRESSION: 1. No acute intracranial findings. 2. Chronic microvascular angiopathy and parenchymal volume loss. Electronically Signed   By: Lovena Le M.D.   On: 06/19/2020 15:10   CT Angio Neck W and/or Wo Contrast  Result Date: 06/19/2020 CLINICAL DATA:  Extremity weakness.  Stroke suspected. EXAM: CT ANGIOGRAPHY HEAD AND NECK TECHNIQUE: Multidetector CT imaging of the head and neck was performed using the standard protocol during bolus administration of intravenous contrast. Multiplanar CT image reconstructions and MIPs were obtained to evaluate  the vascular anatomy. Carotid stenosis measurements (when applicable) are obtained utilizing NASCET criteria, using the distal internal carotid diameter as the denominator. CONTRAST:  74mL OMNIPAQUE IOHEXOL 350 MG/ML SOLN COMPARISON:  CT head without contrast 06/19/2020 and 03/02/2020. FINDINGS: CTA NECK FINDINGS Aortic arch: Common origin of the left common carotid artery and innominate artery is noted. No significant calcifications are present at the aortic arch. No aneurysm or stenosis is present. Right carotid system: The right common carotid artery is within normal limits. Bifurcation is unremarkable. Mild tortuosity is present cervical right ICA without significant stenosis. Left carotid system: The left common carotid artery is within normal limits. Minimal calcifications present bifurcation without significant stenosis. Mild tortuosity is present cervical left ICA without significant stenosis. Vertebral arteries: The vertebral arteries originate from the subclavian arteries bilaterally  without significant stenosis. The left vertebral artery is the dominant vessel. No significant stenosis present in the neck. Skeleton: Cervical spine is fused C4-6 probable fusion is present at C6-7. Grade 2 anterolisthesis at C7-T1 measures 5 cm. Fusion is present at T1-2 and likely T2-3. Other neck: Soft tissues the neck are otherwise unremarkable. Upper chest: Scarring is noted at the lung apices bilaterally. Lungs are otherwise clear. Thoracic inlet is within normal limits. Review of the MIP images confirms the above findings CTA HEAD FINDINGS Anterior circulation: The internal carotid arteries are within normal limits through the ICA termini bilaterally. The A1 and M1 segments are normal. The A1 and M1 segments are normal. The anterior communicating artery is patent. The MCA bifurcations are intact bilaterally. ACA and MCA branch vessels are unremarkable. Posterior circulation: Left vertebral artery is dominant. PICA origins are visualized and normal. The vertebrobasilar junction is normal. Basilar artery is normal. Both posterior cerebral arteries originate from the basilar tip. The PCA branch vessels are within normal limits. Venous sinuses: The dural sinuses are patent. The straight sinus and deep cerebral veins are intact. Cortical veins are unremarkable. Anatomic variants: None Review of the MIP images confirms the above findings IMPRESSION: 1. No emergent large vessel occlusion. 2. Minimal atherosclerotic changes at the left carotid bifurcation without significant stenosis. 3. Mild tortuosity of the cervical internal carotid arteries bilaterally without significant stenosis. 4. Normal variant Circle of Willis without significant proximal stenosis, aneurysm, or branch vessel occlusion. 5. Grade 2 anterolisthesis at C7-T1 measures 5 cm. The cervical spine is fused above this level. Electronically Signed   By: San Morelle M.D.   On: 06/19/2020 17:04   MR BRAIN WO CONTRAST  Result Date:  06/19/2020 CLINICAL DATA:  Follow-up examination for acute stroke. EXAM: MRI HEAD WITHOUT CONTRAST TECHNIQUE: Multiplanar, multiecho pulse sequences of the brain and surrounding structures were obtained without intravenous contrast. COMPARISON:  Prior CTs from earlier the same day. FINDINGS: Brain: Cerebral volume within normal limits for age. Mild scattered T2/FLAIR hyperintensity seen involving the periventricular deep white matter both cerebral hemispheres, most like related chronic microvascular ischemic disease, fairly typical/normal for age. No abnormal foci of restricted diffusion to suggest acute or subacute ischemia. Gray-white matter differentiation maintained. No encephalomalacia to suggest chronic cortical infarction. No foci of susceptibility artifact to suggest acute or chronic intracranial hemorrhage. No mass lesion, midline shift or mass effect. No hydrocephalus or extra-axial fluid collection. Pituitary gland suprasellar region normal. Midline structures intact. Vascular: Major intracranial vascular flow voids are maintained. Skull and upper cervical spine: Degenerative osteoarthritic changes noted about the dens. Craniocervical junction otherwise unremarkable. Bone marrow signal intensity within normal limits. No scalp soft tissue abnormality. Sinuses/Orbits:  Patient status post bilateral ocular lens replacement. Globes and orbital soft tissues demonstrate no acute finding. Paranasal sinuses are largely clear. No significant mastoid effusion. Inner ear structures grossly normal. Other: None. IMPRESSION: Normal brain MRI for age. No acute intracranial infarct or other abnormality. Electronically Signed   By: Jeannine Boga M.D.   On: 06/19/2020 20:22    Microbiology: Recent Results (from the past 240 hour(s))  SARS Coronavirus 2 by RT PCR (hospital order, performed in [facility] hospital lab) Nasopharyngeal Nasopharyngeal Swab     Status: None   Collection Time: 06/19/20  2:11 PM    Specimen: Nasopharyngeal Swab  Result Value Ref Range Status   SARS Coronavirus 2 NEGATIVE NEGATIVE Final    Comment: (NOTE) SARS-CoV-2 target nucleic acids are NOT DETECTED.  The SARS-CoV-2 RNA is generally detectable in upper and lower respiratory specimens during the acute phase of infection. The lowest concentration of SARS-CoV-2 viral copies this assay can detect is 250 copies / mL. A negative result does not preclude SARS-CoV-2 infection and should not be used as the sole basis for treatment or other patient management decisions.  A negative result may occur with improper specimen collection / handling, submission of specimen other than nasopharyngeal swab, presence of viral mutation(s) within the areas targeted by this assay, and inadequate number of viral copies (<250 copies / mL). A negative result must be combined with clinical observations, patient history, and epidemiological information.  Fact Sheet for Patients:   StrictlyIdeas.no  Fact Sheet for Healthcare Providers: BankingDealers.co.za  This test is not yet approved or  cleared by the [place] FDA and has been authorized for detection and/or diagnosis of SARS-CoV-2 by FDA under an Emergency Use Authorization (EUA).  This EUA will remain in effect (meaning this test can be used) for the duration of the COVID-19 declaration under Section 564(b)(1) of the Act, 21 U.S.C. section 360bbb-3(b)(1), unless the authorization is terminated or revoked sooner.  Performed at [facility], [place] [address], [place], [place] [address]      Labs: Basic Metabolic Panel: Recent Labs  Lab 06/19/20 1248 06/19/20 2151 06/20/20 0421  NA 130*  --  132*  K 4.3  --  3.9  CL 97*  --  100  CO2 23  --  25  GLUCOSE 109*  --  99  BUN 20  --  12  CREATININE 0.71 0.65 0.65  CALCIUM 9.8  --  9.0   Liver Function Tests: Recent Labs  Lab 06/19/20 1248  AST 24  ALT 14   ALKPHOS 55  BILITOT 0.6  PROT 6.9  ALBUMIN 3.7   No results for input(s): LIPASE, AMYLASE in the last 168 hours. No results for input(s): AMMONIA in the last 168 hours. CBC: Recent Labs  Lab 06/19/20 1248 06/19/20 2151 06/20/20 0421  WBC 7.1 6.0 6.3  NEUTROABS 4.6  --  3.6  HGB 12.7 12.6 12.0  HCT 39.4 38.9 36.6  MCV 98.5 97.5 95.8  PLT 471* 418* 397   Cardiac Enzymes: No results for input(s): CKTOTAL, CKMB, CKMBINDEX, TROPONINI in the last 168 hours. BNP: BNP (last 3 results) No results for input(s): BNP in the last 8760 hours.  ProBNP (last 3 results) No results for input(s): PROBNP in the last 8760 hours.  CBG: No results for input(s): GLUCAP in the last 168 hours.     Signed:  Kayleen Memos, MD [facility] 06/20/2020, 1:40 PM

## 2020-06-20 NOTE — Care Management CC44 (Signed)
Condition Code 44 Documentation Completed  Patient Details  Name: Sara Davila MRN: [id number] Date of Birth: Sep 19, 1930   Condition Code 44 given:  Yes Patient signature on Condition Code 44 notice:  Yes Documentation of 2 MD's agreement:  Yes Code 44 added to claim:  Yes    Marilu Favre, RN 06/20/2020, 2:05 PM

## 2020-06-20 NOTE — Discharge Instructions (Signed)
Transient Ischemic Attack  A transient ischemic attack (TIA) is a "warning stroke" that causes stroke-like symptoms that go away quickly. A TIA does not cause lasting damage to the brain. But having a TIA is a sign that you may be at risk for a stroke. Lifestyle changes and medical treatments can help prevent a stroke. It is important to know the symptoms of a TIA and what to do. Get help right away, even if your symptoms go away. The symptoms of a TIA are the same as those of a stroke. They can happen fast, and they usually go away within minutes or hours. They can include:  Weakness or loss of feeling in your face, arm, or leg. This often happens on one side of your body.  Trouble walking.  Trouble moving your arms or legs.  Trouble talking or understanding what people are saying.  Trouble seeing.  Seeing two of one object (double vision).  Feeling dizzy.  Feeling confused.  Loss of balance or coordination.  Feeling sick to your stomach (nauseous) and throwing up (vomiting).  A very bad headache for no reason. What increases the risk? Certain things may make you more likely to have a TIA. Some of these are things that you can change, such as:  Being very overweight (obese).  Using products that contain nicotine or tobacco, such as cigarettes and e-cigarettes.  Taking birth control pills.  Not being active.  Drinking too much alcohol.  Using drugs. Other risk factors include:  Having an irregular heartbeat (atrial fibrillation).  Being African American or Hispanic.  Having had blood clots, stroke, TIA, or heart attack in the past.  Being a woman with a history of high blood pressure in pregnancy (preeclampsia).  Being over the age of 60.  Being female.  Having family history of stroke.  Having the following diseases or conditions: ? High blood pressure. ? High cholesterol. ? Diabetes. ? Heart disease. ? Sickle cell disease. ? Sleep apnea. ? Migraine  headache. ? Long-term (chronic) diseases that cause soreness and swelling (inflammation). ? Disorders that affect how your blood clots. Follow these instructions at home: Medicines   Take over-the-counter and prescription medicines only as told by your doctor.  If you were told to take aspirin or another medicine to thin your blood, take it exactly as told by your doctor. ? Taking too much of the medicine can cause bleeding. ? Taking too little of the medicine may not work to treat the problem. Eating and drinking   Eat 5 or more servings of fruits and vegetables each day.  Follow instructions from your doctor about your diet. You may need to follow a certain diet to help lower your risk of having a stroke. You may need to: ? Eat a diet that is low in fat and salt. ? Eat foods that contain a lot of fiber. ? Limit the amount of carbohydrates and sugar in your diet.  Limit alcohol intake to 1 drink a day for nonpregnant women and 2 drinks a day for men. One drink equals 12 oz of beer, 5 oz of wine, or 1 oz of hard liquor. General instructions  Keep a healthy weight.  Stay active. Try to get at least 30 minutes of activity on all or most days.  Find out if you have a condition called sleep apnea. Get treatment if needed.  Do not use any products that contain nicotine or tobacco, such as cigarettes and e-cigarettes. If you need help quitting,   ask your doctor.  Do not abuse drugs.  Keep all follow-up visits as told by your doctor. This is important. Get help right away if:  You have any signs of stroke. "BE FAST" is an easy way to remember the main warning signs: ? B - Balance. Signs are dizziness, sudden trouble walking, or loss of balance. ? E - Eyes. Signs are trouble seeing or a sudden change in how you see. ? F - Face. Signs are sudden weakness or loss of feeling of the face, or the face or eyelid drooping on one side. ? A - Arms. Signs are weakness or loss of feeling in an  arm. This happens suddenly and usually on one side of the body. ? S - Speech. Signs are sudden trouble speaking, slurred speech, or trouble understanding what people say. ? T - Time. Time to call emergency services. Write down what time symptoms started.  You have other signs of stroke, such as: ? A sudden, very bad headache with no known cause. ? Feeling sick to your stomach (nausea). ? Throwing up (vomiting). ? Jerky movements that you cannot control (seizure). These symptoms may be an emergency. Do not wait to see if the symptoms will go away. Get medical help right away. Call your local emergency services (911 in the [place] Do not drive yourself to the hospital. Summary  A transient ischemic attack (TIA) is a "warning stroke" that causes stroke-like symptoms that go away quickly.  A TIA is a medical emergency. Get help right away, even if your symptoms go away.  A TIA does not cause lasting damage to the brain.  Having a TIA is a sign that you may be at risk for a stroke. Lifestyle changes and medical treatments can help prevent a stroke. This information is not intended to replace advice given to you by your health care provider. Make sure you discuss any questions you have with your health care provider. Document Revised: 08/12/2018 Document Reviewed: 02/17/2017 Elsevier Patient Education  2020 Elsevier Inc.  

## 2020-06-20 NOTE — Plan of Care (Signed)
Pt understanding of discharge instructions  

## 2020-06-20 NOTE — Social Work (Addendum)
10:53am- Pt with no PT f/u, no DME recs, have reached back out to Yates Decamp to see if [facility] is needing anything additional from our team.   8:32am- Confirmed with Yates Decamp at [facility] that pt was indeed from [place] with plan to move Friday to ALF. [facility] following for pt needs.  Westley Hummer, MSW, [place] [facility]

## 2020-06-20 NOTE — Progress Notes (Addendum)
PROGRESS NOTE  Sara Davila ZYS:[id number] DOB: 05-13-1930 DOA: 06/19/2020 PCP: [name], MD  HPI/Recap of past 24 hours: HPI: Sara Davila is a 86 y.o. female with medical history significant of hypothyroidism, GERD, neuropathy, osteoporosis, insomnia, chronic anxiety who presents to emergency department due to left-sided weakness.  Around 11 AM she was unable to get up from the chair due to left-sided weakness.  Reports that she is unable to hold anything in her left hand due to weakness.  Reports that her symptoms have resolved now.  CT head negative for any acute intracranial findings.  Neurology recommended [facility] admission for stroke work-up.    MRI brain no acute abnormality.  LDL elevated 94 with a goal of less than 70, started on Lipitor 20 mg daily.  06/20/20: Seen and examined.  No acute events overnight.  She has no new complaints.  Her symptoms have completely resolved.   Assessment/Plan: Principal Problem:   CVA (cerebral vascular accident) ([place]) Active Problems:   Hypothyroidism   Hyponatremia   Asymptomatic bacteriuria   HTN (hypertension), benign   Thrombocytosis (HCC)  TIA -Presented with left-sided weakness-her symptoms resolved upon arrival. CT head negative for any acute intracranial findings.  Neurology recommended [facility] admission for stroke work-up.   MRI brain no acute abnormality. Continue aspirin 81 mg and Plavix 75 mg x 3 weeks, then aspirin alone. LDL elevated 94 with a goal of less than 70, started on Lipitor 20 mg daily. Hemoglobin A1c 6.1, at goal, less than 7.0. Assessed by PT OT with no further recommendations. 2D echo pending Follow-up with neurology outpatient.  Essential hypertension:  Hold HCTZ due to hyponatremia  Switch to Norvasc 2.5 mg daily Follow-up with your PCP.   Hypothyroidism: TSH 2.3 Continue levothyroxine Follow-up with your PCP  Chronic anxiety with insomnia: Stable Continue home  regimen Follow-up with your PCP  GERD:  Stable  Continue PPI  Chronic constipation:  Continue home bowel regimen  Left hip fracture status post left-sided intramedullary nailing of the femur on 03/03/2020 -Continue home regimen as needed.   Follow-up with your orthopedic surgeon  Hyponatremia suspect HCTZ related: Hold off HCTZ Switched with Norvasc 2.5 mg daily Follow with your PCP  Resolved thrombocytosis:  Platelet: 471k> 397K  Asymptomatic bacteriuria:  Patient denies urinary symptoms.   Denies dysuria again this morning She is afebrile with no leukocytosis.   No indication of antibiotics at this time. Follow-up with your PCP   Code Status: DNR-confirmed with the patient  Consults called: Neurology by EDP   Objective: Vitals:   06/19/20 2258 06/19/20 2338 06/20/20 0434 06/20/20 1310  BP: 125/73  135/80 119/85  Pulse: 81  79 84  Resp: 18  18 18   Temp: (!) 97.4 F (36.3 C)  98 F (36.7 C) 98 F (36.7 C)  TempSrc: Oral  Oral Oral  SpO2: 96%  95% 97%  Weight:  41.6 kg    Height:        Intake/Output Summary (Last 24 hours) at 06/20/2020 1334 Last data filed at 06/20/2020 1131 Gross per 24 hour  Intake 490 ml  Output 3 ml  Net 487 ml   Filed Weights   06/19/20 1228 06/19/20 2338  Weight: 43.4 kg 41.6 kg    Exam:  . General: 86 y.o. year-old female well developed well nourished in no acute distress.  Alert and oriented x3. . Cardiovascular: Regular rate and rhythm with no rubs or gallops.  No thyromegaly or JVD  noted.   . Respiratory: Clear to auscultation with no wheezes or rales. Good inspiratory effort. . Abdomen: Soft nontender nondistended with normal bowel sounds x4 quadrants. . Musculoskeletal: No lower extremity edema. Marland Kitchen Psychiatry: Mood is appropriate for condition and setting   Data Reviewed: CBC: Recent Labs  Lab 06/19/20 1248 06/19/20 2151 06/20/20 0421  WBC 7.1 6.0 6.3  NEUTROABS 4.6  --  3.6  HGB 12.7 12.6 12.0  HCT 39.4  38.9 36.6  MCV 98.5 97.5 95.8  PLT 471* 418* 235   Basic Metabolic Panel: Recent Labs  Lab 06/19/20 1248 06/19/20 2151 06/20/20 0421  NA 130*  --  132*  K 4.3  --  3.9  CL 97*  --  100  CO2 23  --  25  GLUCOSE 109*  --  99  BUN 20  --  12  CREATININE 0.71 0.65 0.65  CALCIUM 9.8  --  9.0   GFR: Estimated Creatinine Clearance: 32.5 mL/min (by C-G formula based on SCr of 0.65 mg/dL). Liver Function Tests: Recent Labs  Lab 06/19/20 1248  AST 24  ALT 14  ALKPHOS 55  BILITOT 0.6  PROT 6.9  ALBUMIN 3.7   No results for input(s): LIPASE, AMYLASE in the last 168 hours. No results for input(s): AMMONIA in the last 168 hours. Coagulation Profile: Recent Labs  Lab 06/19/20 1248  INR 0.9   Cardiac Enzymes: No results for input(s): CKTOTAL, CKMB, CKMBINDEX, TROPONINI in the last 168 hours. BNP (last 3 results) No results for input(s): PROBNP in the last 8760 hours. HbA1C: Recent Labs    06/20/20 0421  HGBA1C 6.1*   CBG: No results for input(s): GLUCAP in the last 168 hours. Lipid Profile: Recent Labs    06/20/20 0421  CHOL 171  HDL 59  LDLCALC 94  TRIG 89  CHOLHDL 2.9   Thyroid Function Tests: Recent Labs    06/19/20 2151  TSH 2.389   Anemia Panel: No results for input(s): VITAMINB12, FOLATE, FERRITIN, TIBC, IRON, RETICCTPCT in the last 72 hours. Urine analysis:    Component Value Date/Time   COLORURINE YELLOW 06/19/2020 1248   APPEARANCEUR HAZY (A) 06/19/2020 1248   LABSPEC 1.011 06/19/2020 1248   PHURINE 7.0 06/19/2020 1248   GLUCOSEU NEGATIVE 06/19/2020 1248   HGBUR NEGATIVE 06/19/2020 1248   BILIRUBINUR NEGATIVE 06/19/2020 1248   KETONESUR NEGATIVE 06/19/2020 1248   PROTEINUR NEGATIVE 06/19/2020 1248   NITRITE NEGATIVE 06/19/2020 1248   LEUKOCYTESUR TRACE (A) 06/19/2020 1248   Sepsis Labs: @LABRCNTIP (procalcitonin:4,lacticidven:4)  ) Recent Results (from the past 240 hour(s))  SARS Coronavirus 2 by RT PCR (hospital order, performed in  [place] hospital lab) Nasopharyngeal Nasopharyngeal Swab     Status: None   Collection Time: 06/19/20  2:11 PM   Specimen: Nasopharyngeal Swab  Result Value Ref Range Status   SARS Coronavirus 2 NEGATIVE NEGATIVE Final    Comment: (NOTE) SARS-CoV-2 target nucleic acids are NOT DETECTED.  The SARS-CoV-2 RNA is generally detectable in upper and lower respiratory specimens during the acute phase of infection. The lowest concentration of SARS-CoV-2 viral copies this assay can detect is 250 copies / mL. A negative result does not preclude SARS-CoV-2 infection and should not be used as the sole basis for treatment or other patient management decisions.  A negative result may occur with improper specimen collection / handling, submission of specimen other than nasopharyngeal swab, presence of viral mutation(s) within the areas targeted by this assay, and inadequate number of viral copies (<250  copies / mL). A negative result must be combined with clinical observations, patient history, and epidemiological information.  Fact Sheet for Patients:   StrictlyIdeas.no  Fact Sheet for Healthcare Providers: BankingDealers.co.za  This test is not yet approved or  cleared by the [place] FDA and has been authorized for detection and/or diagnosis of SARS-CoV-2 by FDA under an Emergency Use Authorization (EUA).  This EUA will remain in effect (meaning this test can be used) for the duration of the COVID-19 declaration under Section 564(b)(1) of the Act, 21 U.S.C. section 360bbb-3(b)(1), unless the authorization is terminated or revoked sooner.  Performed at [facility], [place] [address], [place], [place] [address]       Studies: CT Angio Head W or Wo Contrast  Result Date: 06/19/2020 CLINICAL DATA:  Extremity weakness.  Stroke suspected. EXAM: CT ANGIOGRAPHY HEAD AND NECK TECHNIQUE: Multidetector CT imaging of the head and neck was  performed using the standard protocol during bolus administration of intravenous contrast. Multiplanar CT image reconstructions and MIPs were obtained to evaluate the vascular anatomy. Carotid stenosis measurements (when applicable) are obtained utilizing NASCET criteria, using the distal internal carotid diameter as the denominator. CONTRAST:  14mL OMNIPAQUE IOHEXOL 350 MG/ML SOLN COMPARISON:  CT head without contrast 06/19/2020 and 03/02/2020. FINDINGS: CTA NECK FINDINGS Aortic arch: Common origin of the left common carotid artery and innominate artery is noted. No significant calcifications are present at the aortic arch. No aneurysm or stenosis is present. Right carotid system: The right common carotid artery is within normal limits. Bifurcation is unremarkable. Mild tortuosity is present cervical right ICA without significant stenosis. Left carotid system: The left common carotid artery is within normal limits. Minimal calcifications present bifurcation without significant stenosis. Mild tortuosity is present cervical left ICA without significant stenosis. Vertebral arteries: The vertebral arteries originate from the subclavian arteries bilaterally without significant stenosis. The left vertebral artery is the dominant vessel. No significant stenosis present in the neck. Skeleton: Cervical spine is fused C4-6 probable fusion is present at C6-7. Grade 2 anterolisthesis at C7-T1 measures 5 cm. Fusion is present at T1-2 and likely T2-3. Other neck: Soft tissues the neck are otherwise unremarkable. Upper chest: Scarring is noted at the lung apices bilaterally. Lungs are otherwise clear. Thoracic inlet is within normal limits. Review of the MIP images confirms the above findings CTA HEAD FINDINGS Anterior circulation: The internal carotid arteries are within normal limits through the ICA termini bilaterally. The A1 and M1 segments are normal. The A1 and M1 segments are normal. The anterior communicating artery is  patent. The MCA bifurcations are intact bilaterally. ACA and MCA branch vessels are unremarkable. Posterior circulation: Left vertebral artery is dominant. PICA origins are visualized and normal. The vertebrobasilar junction is normal. Basilar artery is normal. Both posterior cerebral arteries originate from the basilar tip. The PCA branch vessels are within normal limits. Venous sinuses: The dural sinuses are patent. The straight sinus and deep cerebral veins are intact. Cortical veins are unremarkable. Anatomic variants: None Review of the MIP images confirms the above findings IMPRESSION: 1. No emergent large vessel occlusion. 2. Minimal atherosclerotic changes at the left carotid bifurcation without significant stenosis. 3. Mild tortuosity of the cervical internal carotid arteries bilaterally without significant stenosis. 4. Normal variant Circle of Willis without significant proximal stenosis, aneurysm, or branch vessel occlusion. 5. Grade 2 anterolisthesis at C7-T1 measures 5 cm. The cervical spine is fused above this level. Electronically Signed   By: Wynetta Fines.D.  On: 06/19/2020 17:04   CT Head Wo Contrast  Result Date: 06/19/2020 CLINICAL DATA:  Extremity weakness EXAM: CT HEAD WITHOUT CONTRAST TECHNIQUE: Contiguous axial images were obtained from the base of the skull through the vertex without intravenous contrast. COMPARISON:  CT 03/02/2020 FINDINGS: Brain: No evidence of acute infarction, hemorrhage, hydrocephalus, extra-axial collection or mass lesion/mass effect. Symmetric prominence of the ventricles, cisterns and sulci compatible with parenchymal volume loss. Patchy areas of white matter hypoattenuation are most compatible with chronic microvascular angiopathy. Vascular: Atherosclerotic calcification of the carotid siphons. No hyperdense vessel. Skull: No calvarial fracture or suspicious osseous lesion. No scalp swelling or hematoma. Sinuses/Orbits: Paranasal sinuses and mastoid  air cells are predominantly clear. Orbital structures are unremarkable aside from prior lens extractions. Other: None. IMPRESSION: 1. No acute intracranial findings. 2. Chronic microvascular angiopathy and parenchymal volume loss. Electronically Signed   By: Lovena Le M.D.   On: 06/19/2020 15:10   CT Angio Neck W and/or Wo Contrast  Result Date: 06/19/2020 CLINICAL DATA:  Extremity weakness.  Stroke suspected. EXAM: CT ANGIOGRAPHY HEAD AND NECK TECHNIQUE: Multidetector CT imaging of the head and neck was performed using the standard protocol during bolus administration of intravenous contrast. Multiplanar CT image reconstructions and MIPs were obtained to evaluate the vascular anatomy. Carotid stenosis measurements (when applicable) are obtained utilizing NASCET criteria, using the distal internal carotid diameter as the denominator. CONTRAST:  14mL OMNIPAQUE IOHEXOL 350 MG/ML SOLN COMPARISON:  CT head without contrast 06/19/2020 and 03/02/2020. FINDINGS: CTA NECK FINDINGS Aortic arch: Common origin of the left common carotid artery and innominate artery is noted. No significant calcifications are present at the aortic arch. No aneurysm or stenosis is present. Right carotid system: The right common carotid artery is within normal limits. Bifurcation is unremarkable. Mild tortuosity is present cervical right ICA without significant stenosis. Left carotid system: The left common carotid artery is within normal limits. Minimal calcifications present bifurcation without significant stenosis. Mild tortuosity is present cervical left ICA without significant stenosis. Vertebral arteries: The vertebral arteries originate from the subclavian arteries bilaterally without significant stenosis. The left vertebral artery is the dominant vessel. No significant stenosis present in the neck. Skeleton: Cervical spine is fused C4-6 probable fusion is present at C6-7. Grade 2 anterolisthesis at C7-T1 measures 5 cm. Fusion is  present at T1-2 and likely T2-3. Other neck: Soft tissues the neck are otherwise unremarkable. Upper chest: Scarring is noted at the lung apices bilaterally. Lungs are otherwise clear. Thoracic inlet is within normal limits. Review of the MIP images confirms the above findings CTA HEAD FINDINGS Anterior circulation: The internal carotid arteries are within normal limits through the ICA termini bilaterally. The A1 and M1 segments are normal. The A1 and M1 segments are normal. The anterior communicating artery is patent. The MCA bifurcations are intact bilaterally. ACA and MCA branch vessels are unremarkable. Posterior circulation: Left vertebral artery is dominant. PICA origins are visualized and normal. The vertebrobasilar junction is normal. Basilar artery is normal. Both posterior cerebral arteries originate from the basilar tip. The PCA branch vessels are within normal limits. Venous sinuses: The dural sinuses are patent. The straight sinus and deep cerebral veins are intact. Cortical veins are unremarkable. Anatomic variants: None Review of the MIP images confirms the above findings IMPRESSION: 1. No emergent large vessel occlusion. 2. Minimal atherosclerotic changes at the left carotid bifurcation without significant stenosis. 3. Mild tortuosity of the cervical internal carotid arteries bilaterally without significant stenosis. 4. Normal variant Circle of Willis  without significant proximal stenosis, aneurysm, or branch vessel occlusion. 5. Grade 2 anterolisthesis at C7-T1 measures 5 cm. The cervical spine is fused above this level. Electronically Signed   By: San Morelle M.D.   On: 06/19/2020 17:04   MR BRAIN WO CONTRAST  Result Date: 06/19/2020 CLINICAL DATA:  Follow-up examination for acute stroke. EXAM: MRI HEAD WITHOUT CONTRAST TECHNIQUE: Multiplanar, multiecho pulse sequences of the brain and surrounding structures were obtained without intravenous contrast. COMPARISON:  Prior CTs from  earlier the same day. FINDINGS: Brain: Cerebral volume within normal limits for age. Mild scattered T2/FLAIR hyperintensity seen involving the periventricular deep white matter both cerebral hemispheres, most like related chronic microvascular ischemic disease, fairly typical/normal for age. No abnormal foci of restricted diffusion to suggest acute or subacute ischemia. Gray-white matter differentiation maintained. No encephalomalacia to suggest chronic cortical infarction. No foci of susceptibility artifact to suggest acute or chronic intracranial hemorrhage. No mass lesion, midline shift or mass effect. No hydrocephalus or extra-axial fluid collection. Pituitary gland suprasellar region normal. Midline structures intact. Vascular: Major intracranial vascular flow voids are maintained. Skull and upper cervical spine: Degenerative osteoarthritic changes noted about the dens. Craniocervical junction otherwise unremarkable. Bone marrow signal intensity within normal limits. No scalp soft tissue abnormality. Sinuses/Orbits: Patient status post bilateral ocular lens replacement. Globes and orbital soft tissues demonstrate no acute finding. Paranasal sinuses are largely clear. No significant mastoid effusion. Inner ear structures grossly normal. Other: None. IMPRESSION: Normal brain MRI for age. No acute intracranial infarct or other abnormality. Electronically Signed   By: Jeannine Boga M.D.   On: 06/19/2020 20:22    Scheduled Meds: .  stroke: mapping our early stages of recovery book   Does not apply Once  . amLODipine  2.5 mg Oral Daily  . [START ON 06/21/2020] aspirin EC  81 mg Oral Daily  . [START ON 06/21/2020] atorvastatin  20 mg Oral Daily  . clopidogrel  75 mg Oral Daily  . enoxaparin (LOVENOX) injection  30 mg Subcutaneous Q24H  . levothyroxine  75 mcg Oral QAC breakfast  . pantoprazole  40 mg Oral Daily  . senna-docusate  2 tablet Oral BID    Continuous Infusions:   LOS: 1 day      Kayleen Memos, MD [facility] Pager [phone]  If 7PM-7AM, please contact night-coverage [email or web address] Password [email or web address] 06/20/2020, 1:34 PM

## 2020-06-20 NOTE — Progress Notes (Signed)
STROKE TEAM PROGRESS NOTE   INTERVAL HISTORY I have personally reviewed history of presenting illness with the patient, electronic medical records and imaging films in PACS.  She presented with 5 to 10 minutes episode of left hand weakness and numbness yesterday and has remained stable since then.  No further recurrent symptoms.  Vital signs stable.  MRI scan of the brain negative for acute stroke.  CT angiograms of the brain and neck did not show any significant large vessel occlusion.  LDL cholesterol 94 mg percent.  Hemoglobin A1c 6.1.  Echocardiogram is pending  Vitals:   06/19/20 2216 06/19/20 2258 06/19/20 2338 06/20/20 0434  BP: (!) 166/85 125/73  135/80  Pulse: 82 81  79  Resp: 14 18  18   Temp: 98 F (36.7 C) (!) 97.4 F (36.3 C)  98 F (36.7 C)  TempSrc:  Oral  Oral  SpO2: 95% 96%  95%  Weight:   41.6 kg   Height:       CBC:  Recent Labs  Lab 06/19/20 1248 06/19/20 1248 06/19/20 2151 06/20/20 0421  WBC 7.1   < > 6.0 6.3  NEUTROABS 4.6  --   --  3.6  HGB 12.7   < > 12.6 12.0  HCT 39.4   < > 38.9 36.6  MCV 98.5   < > 97.5 95.8  PLT 471*   < > 418* 397   < > = values in this interval not displayed.   Basic Metabolic Panel:  Recent Labs  Lab 06/19/20 1248 06/19/20 1248 06/19/20 2151 06/20/20 0421  NA 130*  --   --  132*  K 4.3  --   --  3.9  CL 97*  --   --  100  CO2 23  --   --  25  GLUCOSE 109*  --   --  99  BUN 20  --   --  12  CREATININE 0.71   < > 0.65 0.65  CALCIUM 9.8  --   --  9.0   < > = values in this interval not displayed.   Lipid Panel:  Recent Labs  Lab 06/20/20 0421  CHOL 171  TRIG 89  HDL 59  CHOLHDL 2.9  VLDL 18  LDLCALC 94   HgbA1c:  Recent Labs  Lab 06/20/20 0421  HGBA1C 6.1*   Urine Drug Screen: No results for input(s): LABOPIA, COCAINSCRNUR, LABBENZ, AMPHETMU, THCU, LABBARB in the last 168 hours.  Alcohol Level No results for input(s): ETH in the last 168 hours.  IMAGING past 24 hours CT Angio Head W or Wo  Contrast  Result Date: 06/19/2020 CLINICAL DATA:  Extremity weakness.  Stroke suspected. EXAM: CT ANGIOGRAPHY HEAD AND NECK TECHNIQUE: Multidetector CT imaging of the head and neck was performed using the standard protocol during bolus administration of intravenous contrast. Multiplanar CT image reconstructions and MIPs were obtained to evaluate the vascular anatomy. Carotid stenosis measurements (when applicable) are obtained utilizing NASCET criteria, using the distal internal carotid diameter as the denominator. CONTRAST:  31mL OMNIPAQUE IOHEXOL 350 MG/ML SOLN COMPARISON:  CT head without contrast 06/19/2020 and 03/02/2020. FINDINGS: CTA NECK FINDINGS Aortic arch: Common origin of the left common carotid artery and innominate artery is noted. No significant calcifications are present at the aortic arch. No aneurysm or stenosis is present. Right carotid system: The right common carotid artery is within normal limits. Bifurcation is unremarkable. Mild tortuosity is present cervical right ICA without significant stenosis. Left carotid system: The left  common carotid artery is within normal limits. Minimal calcifications present bifurcation without significant stenosis. Mild tortuosity is present cervical left ICA without significant stenosis. Vertebral arteries: The vertebral arteries originate from the subclavian arteries bilaterally without significant stenosis. The left vertebral artery is the dominant vessel. No significant stenosis present in the neck. Skeleton: Cervical spine is fused C4-6 probable fusion is present at C6-7. Grade 2 anterolisthesis at C7-T1 measures 5 cm. Fusion is present at T1-2 and likely T2-3. Other neck: Soft tissues the neck are otherwise unremarkable. Upper chest: Scarring is noted at the lung apices bilaterally. Lungs are otherwise clear. Thoracic inlet is within normal limits. Review of the MIP images confirms the above findings CTA HEAD FINDINGS Anterior circulation: The internal  carotid arteries are within normal limits through the ICA termini bilaterally. The A1 and M1 segments are normal. The A1 and M1 segments are normal. The anterior communicating artery is patent. The MCA bifurcations are intact bilaterally. ACA and MCA branch vessels are unremarkable. Posterior circulation: Left vertebral artery is dominant. PICA origins are visualized and normal. The vertebrobasilar junction is normal. Basilar artery is normal. Both posterior cerebral arteries originate from the basilar tip. The PCA branch vessels are within normal limits. Venous sinuses: The dural sinuses are patent. The straight sinus and deep cerebral veins are intact. Cortical veins are unremarkable. Anatomic variants: None Review of the MIP images confirms the above findings IMPRESSION: 1. No emergent large vessel occlusion. 2. Minimal atherosclerotic changes at the left carotid bifurcation without significant stenosis. 3. Mild tortuosity of the cervical internal carotid arteries bilaterally without significant stenosis. 4. Normal variant Circle of Willis without significant proximal stenosis, aneurysm, or branch vessel occlusion. 5. Grade 2 anterolisthesis at C7-T1 measures 5 cm. The cervical spine is fused above this level. Electronically Signed   By: San Morelle M.D.   On: 06/19/2020 17:04   CT Head Wo Contrast  Result Date: 06/19/2020 CLINICAL DATA:  Extremity weakness EXAM: CT HEAD WITHOUT CONTRAST TECHNIQUE: Contiguous axial images were obtained from the base of the skull through the vertex without intravenous contrast. COMPARISON:  CT 03/02/2020 FINDINGS: Brain: No evidence of acute infarction, hemorrhage, hydrocephalus, extra-axial collection or mass lesion/mass effect. Symmetric prominence of the ventricles, cisterns and sulci compatible with parenchymal volume loss. Patchy areas of white matter hypoattenuation are most compatible with chronic microvascular angiopathy. Vascular: Atherosclerotic  calcification of the carotid siphons. No hyperdense vessel. Skull: No calvarial fracture or suspicious osseous lesion. No scalp swelling or hematoma. Sinuses/Orbits: Paranasal sinuses and mastoid air cells are predominantly clear. Orbital structures are unremarkable aside from prior lens extractions. Other: None. IMPRESSION: 1. No acute intracranial findings. 2. Chronic microvascular angiopathy and parenchymal volume loss. Electronically Signed   By: Lovena Le M.D.   On: 06/19/2020 15:10   CT Angio Neck W and/or Wo Contrast  Result Date: 06/19/2020 CLINICAL DATA:  Extremity weakness.  Stroke suspected. EXAM: CT ANGIOGRAPHY HEAD AND NECK TECHNIQUE: Multidetector CT imaging of the head and neck was performed using the standard protocol during bolus administration of intravenous contrast. Multiplanar CT image reconstructions and MIPs were obtained to evaluate the vascular anatomy. Carotid stenosis measurements (when applicable) are obtained utilizing NASCET criteria, using the distal internal carotid diameter as the denominator. CONTRAST:  65mL OMNIPAQUE IOHEXOL 350 MG/ML SOLN COMPARISON:  CT head without contrast 06/19/2020 and 03/02/2020. FINDINGS: CTA NECK FINDINGS Aortic arch: Common origin of the left common carotid artery and innominate artery is noted. No significant calcifications are present at the aortic  arch. No aneurysm or stenosis is present. Right carotid system: The right common carotid artery is within normal limits. Bifurcation is unremarkable. Mild tortuosity is present cervical right ICA without significant stenosis. Left carotid system: The left common carotid artery is within normal limits. Minimal calcifications present bifurcation without significant stenosis. Mild tortuosity is present cervical left ICA without significant stenosis. Vertebral arteries: The vertebral arteries originate from the subclavian arteries bilaterally without significant stenosis. The left vertebral artery is the  dominant vessel. No significant stenosis present in the neck. Skeleton: Cervical spine is fused C4-6 probable fusion is present at C6-7. Grade 2 anterolisthesis at C7-T1 measures 5 cm. Fusion is present at T1-2 and likely T2-3. Other neck: Soft tissues the neck are otherwise unremarkable. Upper chest: Scarring is noted at the lung apices bilaterally. Lungs are otherwise clear. Thoracic inlet is within normal limits. Review of the MIP images confirms the above findings CTA HEAD FINDINGS Anterior circulation: The internal carotid arteries are within normal limits through the ICA termini bilaterally. The A1 and M1 segments are normal. The A1 and M1 segments are normal. The anterior communicating artery is patent. The MCA bifurcations are intact bilaterally. ACA and MCA branch vessels are unremarkable. Posterior circulation: Left vertebral artery is dominant. PICA origins are visualized and normal. The vertebrobasilar junction is normal. Basilar artery is normal. Both posterior cerebral arteries originate from the basilar tip. The PCA branch vessels are within normal limits. Venous sinuses: The dural sinuses are patent. The straight sinus and deep cerebral veins are intact. Cortical veins are unremarkable. Anatomic variants: None Review of the MIP images confirms the above findings IMPRESSION: 1. No emergent large vessel occlusion. 2. Minimal atherosclerotic changes at the left carotid bifurcation without significant stenosis. 3. Mild tortuosity of the cervical internal carotid arteries bilaterally without significant stenosis. 4. Normal variant Circle of Willis without significant proximal stenosis, aneurysm, or branch vessel occlusion. 5. Grade 2 anterolisthesis at C7-T1 measures 5 cm. The cervical spine is fused above this level. Electronically Signed   By: San Morelle M.D.   On: 06/19/2020 17:04   MR BRAIN WO CONTRAST  Result Date: 06/19/2020 CLINICAL DATA:  Follow-up examination for acute stroke. EXAM:  MRI HEAD WITHOUT CONTRAST TECHNIQUE: Multiplanar, multiecho pulse sequences of the brain and surrounding structures were obtained without intravenous contrast. COMPARISON:  Prior CTs from earlier the same day. FINDINGS: Brain: Cerebral volume within normal limits for age. Mild scattered T2/FLAIR hyperintensity seen involving the periventricular deep white matter both cerebral hemispheres, most like related chronic microvascular ischemic disease, fairly typical/normal for age. No abnormal foci of restricted diffusion to suggest acute or subacute ischemia. Gray-white matter differentiation maintained. No encephalomalacia to suggest chronic cortical infarction. No foci of susceptibility artifact to suggest acute or chronic intracranial hemorrhage. No mass lesion, midline shift or mass effect. No hydrocephalus or extra-axial fluid collection. Pituitary gland suprasellar region normal. Midline structures intact. Vascular: Major intracranial vascular flow voids are maintained. Skull and upper cervical spine: Degenerative osteoarthritic changes noted about the dens. Craniocervical junction otherwise unremarkable. Bone marrow signal intensity within normal limits. No scalp soft tissue abnormality. Sinuses/Orbits: Patient status post bilateral ocular lens replacement. Globes and orbital soft tissues demonstrate no acute finding. Paranasal sinuses are largely clear. No significant mastoid effusion. Inner ear structures grossly normal. Other: None. IMPRESSION: Normal brain MRI for age. No acute intracranial infarct or other abnormality. Electronically Signed   By: Jeannine Boga M.D.   On: 06/19/2020 20:22    PHYSICAL EXAM Frail petite  elderly Caucasian lady not in distress. . Afebrile. Head is nontraumatic. Neck is supple without bruit.    Cardiac exam no murmur or gallop. Lungs are clear to auscultation. Distal pulses are well felt. Neurological Exam ;  Awake  Alert oriented x 3. Normal speech and language.eye  movements full without nystagmus.fundi were not visualized. Vision acuity and fields appear normal. Hearing is normal. Palatal movements are normal. Face symmetric. Tongue midline. Normal strength, tone, reflexes and coordination. Normal sensation. Gait deferred. ASSESSMENT/PLAN Sara Davila is a 86 y.o. female with history of TIA,  HTN presenting with L sided weakness.    R brain TIA likely from small vessel disease CT head No acute abnormality. Small vessel disease. Atrophy.   CTA head & neck no ELVO. L ICA bifurcation w/ minimal atherosclerosis. B cervical ICA tortuosity. Grade 2 anterolisthesis C7-11, CS fused above this level  MRI  No acute abnormality  2D Echo pending  LDL 94  HgbA1c 6.1  VTE prophylaxis - Lovenox 30 mg sq daily   No antithrombotic prior to admission, now on aspirin 325 mg daily. Decrease aspirin to 81 and add plavix 75 mg daily. Continue DAPT x 3 weeks then aspirin alone.   Therapy recommendations:  pending   Disposition:  Return to [facility] - from [facility] apt w/ plans to move to ALF tomorrow at [facility]  Hypertension  Stable . BP goal normotensive  Hyperlipidemia  Home meds:  No statin  Now on lipitor 80   LDL 94, goal < 70  Will decrease statin to 20 given advanced age  Continue statin at discharge  Other Stroke Risk Factors  Advanced age  Former Cigarette smoker  ETOH use,  advised to drink no more than 1 drink(s) a day  Hx TIA per pt  Family hx stroke (father, paternal uncle, sister)  Other Active Problems  Hypothyroidism  Anxiety w/ insomnia  GERD  Chronic constipation  L hip fx s/p repair 03/03/20  Hyponatremia  Thrombocytosis, PLT 471  Workup for memory difficulties in the past (workup normal) - Dr. Jaynee Eagles. last seen 03/2018  Hospital day # 1 She presented with transient left upper extremity weakness due to right brain subcortical TIA from small vessel disease.  Recommend aspirin Plavix for 3 weeks followed by aspirin alone  and aggressive risk factor modification.  Check echocardiogram.  Greater than 50% time during this 35-minute visit was spent on counseling and coordination of care and discussion about stroke and TIA prevention and treatment and answering questions.  Discussed with Dr. Doristine Bosworth. Sara Contras, MD To contact Stroke Continuity provider, please refer to [email or web address] After hours, contact General Neurology

## 2020-06-20 NOTE — Evaluation (Signed)
Occupational Therapy Evaluation Patient Details Name: Sara Davila MRN: [id number] DOB: 11-01-1930 Today's Date: 06/20/2020    History of Present Illness Pt is an 86 y/o female admitted secondary to L UE weakness. MRI of the brain was negative for any acute findings. PMH including but not limited to L femur fx s/p IM nail on 03/03/20, fibromyalgia and HTN.   Clinical Impression   Pt. Was seen for skilled OT to assess for needs. Pt. Has decreased balance and tolerance for activity. Pt. States she uses rw or furniture walks at home. Pt. [facility] and [facility] to [facility]. Pt. [facility] her for further needs. Acute OT to follow until dc.     Follow Up Recommendations  Other (comment) ([facility]. states [facility] has their own therapy staff )    Equipment Recommendations  None recommended by OT    Recommendations for Other Services       Precautions / Restrictions Precautions Precautions: Fall Restrictions Weight Bearing Restrictions: No      Mobility Bed Mobility Overal bed mobility: Needs Assistance Bed Mobility: Supine to Sit     Supine to sit: Supervision     General bed mobility comments: increased time and effort needed  Transfers Overall transfer level: Needs assistance Equipment used: Rolling walker (2 wheeled) Transfers: Sit to/from Stand Sit to Stand: Supervision         General transfer comment: s level with walker    Balance Overall balance assessment: Needs assistance Sitting-balance support: Feet supported Sitting balance-Leahy Scale: Good     Standing balance support: During functional activity;No upper extremity supported Standing balance-Leahy Scale: Fair                             ADL either performed or assessed with clinical judgement   ADL Overall ADL's : Needs assistance/impaired Eating/Feeding:  Independent   Grooming: Wash/dry hands;Wash/dry face;Supervision/safety;Standing   Upper Body Bathing: Supervision/ safety;Set up;Sitting   Lower Body Bathing: Supervison/ safety;Sit to/from stand   Upper Body Dressing : Supervision/safety;Sitting;Set up   Lower Body Dressing: Sit to/from stand;Supervision/safety;Set up   Toilet Transfer: Supervision/safety;Ambulation;RW   Toileting- Clothing Manipulation and Hygiene: Supervision/safety;Sit to/from stand   Tub/ Banker: Supervision/safety;Grab bars   Functional mobility during ADLs: Supervision/safety;Rolling walker General ADL Comments: Pt. is able to perform dressing task at setup level. Pt .does not wear socks and just puts on slip on shoes. Pt. does have sock donner and knows how to use.      Vision Baseline Vision/History: Wears glasses Wears Glasses: At all times Patient Visual Report: No change from baseline Vision Assessment?: No apparent visual deficits     Perception     Praxis      Pertinent Vitals/Pain Pain Assessment: No/denies pain Faces Pain Scale: Hurts a little bit Pain Location: L groin Pain Descriptors / Indicators: Sore Pain Intervention(s): Monitored during session;Repositioned     Hand Dominance Left   Extremity/Trunk Assessment Upper Extremity Assessment Upper Extremity Assessment: Overall WFL for tasks assessed   Lower Extremity Assessment Lower Extremity Assessment: Generalized weakness   Cervical / Trunk Assessment Cervical / Trunk Assessment: Kyphotic   Communication Communication Communication: No difficulties   Cognition Arousal/Alertness: Awake/alert Behavior During Therapy: WFL for tasks assessed/performed Overall Cognitive Status: Within Functional Limits for tasks assessed  General Comments       Exercises     Shoulder Instructions      Home Living Family/patient expects to be discharged to:: Assisted  living                             Home Equipment: Walker - 4 wheels;Shower seat;Tub bench;Grab bars - toilet;Grab bars - tub/shower;Hand held shower head   Additional Comments: pt reporting that she was previously at ILF but will be moving to the ALF section upon d/c      Prior Functioning/Environment Level of Independence: Independent with assistive device(s)        Comments: ambulates with use of a rollator        OT Problem List: Decreased strength      OT Treatment/Interventions: Self-care/ADL training;DME and/or AE instruction;Therapeutic activities;Patient/family education    OT Goals(Current goals can be found in the care plan section) Acute Rehab OT Goals Patient Stated Goal: go home OT Goal Formulation: With patient Time For Goal Achievement: 07/04/20 Potential to Achieve Goals: Good ADL Goals Pt Will Perform Lower Body Bathing: with modified independence Pt Will Perform Lower Body Dressing: with modified independence Pt Will Transfer to Toilet: with modified independence Pt Will Perform Tub/Shower Transfer: with modified independence  OT Frequency: Min 2X/week   Barriers to D/C:            Co-evaluation              AM-PAC OT "6 Clicks" Daily Activity     Outcome Measure Help from another person eating meals?: None Help from another person taking care of personal grooming?: A Little Help from another person toileting, which includes using toliet, bedpan, or urinal?: A Little Help from another person bathing (including washing, rinsing, drying)?: A Little Help from another person to put on and taking off regular upper body clothing?: A Little Help from another person to put on and taking off regular lower body clothing?: A Little 6 Click Score: 19   End of Session Equipment Utilized During Treatment: Rolling walker Nurse Communication:  (ok therapy)  Activity Tolerance: Patient tolerated treatment well Patient left: in bed;with call  bell/phone within reach;with bed alarm set  OT Visit Diagnosis: Unsteadiness on feet (R26.81)                Time: 3343-5686 OT Time Calculation (min): 33 min Charges:  OT General Charges $OT Visit: 1 Visit OT Evaluation $OT Eval Moderate Complexity: 1 Mod  Sara Davila OT/L    Sara Davila 06/20/2020, 12:29 PM

## 2020-06-20 NOTE — Progress Notes (Signed)
Isla Pence to be D/C'd Home per MD order.  Discussed with the patient and all questions fully answered.   VSS, Skin clean, dry and intact without evidence of skin break down, no evidence of skin tears noted. IV catheter discontinued intact. Site without signs and symptoms of complications. Dressing and pressure applied.   An After Visit Summary was printed and given to the patient.    D/C education completed with patient/family including follow up instructions, medication list, d/c activities limitations if indicated, with other d/c instructions as indicated by MD - patient able to verbalize understanding, all questions fully answered.    Patient instructed to return to ED, call 911, or call MD for any changes in condition.    Patient escorted via WC, and D/C home via car with son.

## 2020-06-20 NOTE — Evaluation (Signed)
Physical Therapy Evaluation Patient Details Name: Sara Davila MRN: [id number] DOB: Aug 02, 1930 Today's Date: 06/20/2020   History of Present Illness  Pt is an 86 y/o female admitted secondary to L UE weakness. MRI of the brain was negative for any acute findings. PMH including but not limited to L femur fx s/p IM nail on 03/03/20, fibromyalgia and HTN.    Clinical Impression  Pt presented supine in bed with HOB elevated, awake and willing to participate in therapy session. Prior to admission, pt reported that she ambulated with use of a rollator and was independent with ADLs. Pt stated that she previously lived in [place] and will be transitioning to ALF upon d/c. At the time of evaluation, pt overall at a supervision to min guard level with functional mobility including hallway ambulation with use of RW. Pt very motivated to move and eager to get back to her functional baseline. No focal weakness noted on eval with either UE or LEs. PT will continue to f/u with pt acutely to progress mobility as tolerated per PT POC to ensure a safe d/c home.     Follow Up Recommendations No PT follow up    Equipment Recommendations  None recommended by PT    Recommendations for Other Services       Precautions / Restrictions Precautions Precautions: Fall Restrictions Weight Bearing Restrictions: No      Mobility  Bed Mobility Overal bed mobility: Needs Assistance Bed Mobility: Supine to Sit     Supine to sit: Supervision     General bed mobility comments: increased time and effort needed  Transfers Overall transfer level: Needs assistance Equipment used: Rolling walker (2 wheeled) Transfers: Sit to/from Stand Sit to Stand: Min guard         General transfer comment: min guard for safety with initial transition into standing from EOB  Ambulation/Gait Ambulation/Gait assistance: Min guard Gait Distance (Feet): 200 Feet Assistive device: Rolling walker (2 wheeled) Gait  Pattern/deviations: Step-through pattern;Decreased stride length Gait velocity: decreased   General Gait Details: pt steady overall with use of RW, no LOB or need for physical assistance, min guard for safety, no complaints of increased L groin pain  Stairs            Wheelchair Mobility    Modified Rankin (Stroke Patients Only) Modified Rankin (Stroke Patients Only) Pre-Morbid Rankin Score: Moderate disability Modified Rankin: Moderate disability     Balance Overall balance assessment: Needs assistance Sitting-balance support: Feet supported Sitting balance-Leahy Scale: Good     Standing balance support: During functional activity;No upper extremity supported Standing balance-Leahy Scale: Fair                               Pertinent Vitals/Pain Pain Assessment: Faces Faces Pain Scale: Hurts a little bit Pain Location: L groin Pain Descriptors / Indicators: Sore Pain Intervention(s): Monitored during session;Repositioned    Home Living Family/patient expects to be discharged to:: Assisted living               Home Equipment: Walker - 4 wheels;Shower seat Additional Comments: pt reporting that she was previously at [facility] [place] [facility] but will be moving to the ALF section upon d/c    Prior Function Level of Independence: Independent with assistive device(s)         Comments: ambulates with use of a rollator     Hand Dominance   Dominant Hand: Left    Extremity/Trunk Assessment  Upper Extremity Assessment Upper Extremity Assessment: Overall WFL for tasks assessed    Lower Extremity Assessment Lower Extremity Assessment: Generalized weakness    Cervical / Trunk Assessment Cervical / Trunk Assessment: Kyphotic  Communication   Communication: No difficulties  Cognition Arousal/Alertness: Awake/alert Behavior During Therapy: WFL for tasks assessed/performed Overall Cognitive Status: Within Functional Limits for tasks assessed                                         General Comments      Exercises     Assessment/Plan    PT Assessment Patient needs continued PT services  PT Problem List Decreased strength;Decreased balance;Decreased mobility;Decreased coordination       PT Treatment Interventions DME instruction;Gait training;Functional mobility training;Stair training;Therapeutic activities;Therapeutic exercise;Balance training;Neuromuscular re-education;Patient/family education    PT Goals (Current goals can be found in the Care Plan section)  Acute Rehab PT Goals Patient Stated Goal: "go home soon" PT Goal Formulation: With patient Time For Goal Achievement: 07/04/20 Potential to Achieve Goals: Good    Frequency Min 3X/week   Barriers to discharge        Co-evaluation               AM-PAC PT "6 Clicks" Mobility  Outcome Measure Help needed turning from your back to your side while in a flat bed without using bedrails?: None Help needed moving from lying on your back to sitting on the side of a flat bed without using bedrails?: None Help needed moving to and from a bed to a chair (including a wheelchair)?: None Help needed standing up from a chair using your arms (e.g., wheelchair or bedside chair)?: None Help needed to walk in hospital room?: None Help needed climbing 3-5 steps with a railing? : A Little 6 Click Score: 23    End of Session Equipment Utilized During Treatment: Gait belt Activity Tolerance: Patient tolerated treatment well Patient left: with call bell/phone within reach;Other (comment) (seated on toilet with MD present) Nurse Communication: Mobility status PT Visit Diagnosis: Other abnormalities of gait and mobility (R26.89)    Time: 5885-0277 PT Time Calculation (min) (ACUTE ONLY): 20 min   Charges:   PT Evaluation $PT Eval Moderate Complexity: 1 Mod          Eduard Clos, PT, DPT  Acute Rehabilitation Services Pager [phone] Office  [facility] 06/20/2020, 10:21 AM

## 2020-06-21 ENCOUNTER — Encounter: Payer: Self-pay | Admitting: Nurse Practitioner

## 2020-06-21 ENCOUNTER — Non-Acute Institutional Stay: Payer: Medicare Other | Admitting: Nurse Practitioner

## 2020-06-21 DIAGNOSIS — G629 Polyneuropathy, unspecified: Secondary | ICD-10-CM

## 2020-06-21 DIAGNOSIS — I10 Essential (primary) hypertension: Secondary | ICD-10-CM

## 2020-06-21 DIAGNOSIS — K5901 Slow transit constipation: Secondary | ICD-10-CM

## 2020-06-21 DIAGNOSIS — M8000XS Age-related osteoporosis with current pathological fracture, unspecified site, sequela: Secondary | ICD-10-CM

## 2020-06-21 DIAGNOSIS — D75839 Thrombocytosis, unspecified: Secondary | ICD-10-CM

## 2020-06-21 DIAGNOSIS — E871 Hypo-osmolality and hyponatremia: Secondary | ICD-10-CM

## 2020-06-21 DIAGNOSIS — K219 Gastro-esophageal reflux disease without esophagitis: Secondary | ICD-10-CM | POA: Diagnosis not present

## 2020-06-21 DIAGNOSIS — E032 Hypothyroidism due to medicaments and other exogenous substances: Secondary | ICD-10-CM

## 2020-06-21 DIAGNOSIS — G459 Transient cerebral ischemic attack, unspecified: Secondary | ICD-10-CM

## 2020-06-21 DIAGNOSIS — F5105 Insomnia due to other mental disorder: Secondary | ICD-10-CM

## 2020-06-21 DIAGNOSIS — F419 Anxiety disorder, unspecified: Secondary | ICD-10-CM

## 2020-06-21 DIAGNOSIS — D473 Essential (hemorrhagic) thrombocythemia: Secondary | ICD-10-CM

## 2020-06-21 NOTE — Assessment & Plan Note (Signed)
Stable, continue  Senokot S II bid, prn MiraLax.

## 2020-06-21 NOTE — Assessment & Plan Note (Signed)
Stable continue Tylenol 500mg  bid, prn Tramadol.

## 2020-06-21 NOTE — Assessment & Plan Note (Signed)
off HCTZ, Na 132 06/20/20

## 2020-06-21 NOTE — Progress Notes (Signed)
Location:  [facility] Room Number: 361 Place of Service:  [facility] (13) Provider: Lennie Odor Kasee Hantz NP  [name], MD  Patient Care Team: [name], MD as PCP - General (Internal Medicine)  Extended Emergency Contact Information Primary Emergency Contact: Katherine Mantle of [place] Phone: [phone] Relation: Daughter Secondary Emergency Contact: Milon Dikes [place] of [place] Phone: [phone] Mobile Phone: [phone] Relation: Friend  Code Status: DNR Goals of care: Advanced Directive information Advanced Directives 06/21/2020  Does Patient Have a Medical Advance Directive? No  Type of Advance Directive -  Does patient want to make changes to medical advance directive? -  Copy of [place] in Chart? -  Would patient like information on creating a medical advance directive? -  Pre-existing out of facility DNR order (yellow form or pink MOST form) -     Chief Complaint  Patient presents with  . New Admit To SNF    Review Medications    HPI:  Pt is a 86 y.o. female seen today for an acute visit for following hospital stay 7/21-7/22 for left sided weakness, resolved upon arrival of ED. CT head negative forany acute intracranialfindings. MRI brain no acute abnormality. LDL elevated 94 06/20/20 with a goal of less than 70, started on Lipitor 20 mg daily for TIA. Underwent neurology consult, will f/u as outpatient. Continue Plavix and ASA for 3 weeks, then ASA only.   Hyponatremia, off HCTZ, Na 132 06/20/20  Hypothyroidism takes Levothyroxine, TSH 2.3 06/19/20  GERD, prn Omeprazole  Neuropathy, takes Tylenol 500mg  bid, prn Tramadol.   OP, takes Boniva, Vit D, Ca.   Insomnia/Anxiety, takes Lorazepam, Ambien  Thrombocytosis resolved, plt 397  HTN, takes Amlodipine 2.5mg  qd.   Constipation, takes Senokot S II bid, prn MiraLax.   Past Medical History:  Diagnosis Date  . Abdominal pain 02/26/2017  . Anxiety    . Arthritis    Left Knee, Wrist, Back and Hips  . Cervical vertebral fusion   . Diverticulitis   . Diverticulosis   . Fibromyalgia   . GERD (gastroesophageal reflux disease)   . Gout 02/2018   L hand  . Hypothyroidism   . Pelvis fracture ([place])   . Peritoneal free air 03/29/2012  . Pneumonia   . Pneumoperitoneum 02/26/2017  . Thyroid disorder   . TIA (transient ischemic attack)   . Toe pain 09/06/2016   Past Surgical History:  Procedure Laterality Date  . ABDOMINAL HYSTERECTOMY    . BOWEL RESECTION N/A 07/09/2017   Procedure: SMALL BOWEL RESECTION;  Surgeon: Johnathan Hausen, MD;  Location: [facility];  Service: General;  Laterality: N/A;  . CERVICAL FUSION     x2  . FEMUR IM NAIL Left 03/03/2020   Procedure: INTRAMEDULLARY (IM) NAIL FEMORAL;  Surgeon: Paralee Cancel, MD;  Location: [facility];  Service: Orthopedics;  Laterality: Left;  . LAPAROSCOPIC APPENDECTOMY N/A 03/04/2017   Procedure: APPENDECTOMY LAPAROSCOPIC;  Surgeon: Johnathan Hausen, MD;  Location: [facility];  Service: General;  Laterality: N/A;  . LAPAROSCOPY N/A 03/04/2017   Procedure: LAPAROSCOPY DIAGNOSTIC, ENTEROLYSIS;  Surgeon: Johnathan Hausen, MD;  Location: [facility];  Service: General;  Laterality: N/A;  . LAPAROTOMY N/A 07/09/2017   Procedure: EXPLORATORY LAPAROTOMY;  Surgeon: Johnathan Hausen, MD;  Location: [facility];  Service: General;  Laterality: N/A;  . SPINE SURGERY     Lumbar- rod placement  . TONSILLECTOMY     86y/o  . TOTAL VAGINAL HYSTERECTOMY  Allergies  Allergen Reactions  . Codeine Rash  . Penicillins Rash    Has patient had a PCN reaction causing immediate rash, facial/tongue/throat swelling, SOB or lightheadedness with hypotension: No Has patient had a PCN reaction causing severe rash involving mucus membranes or skin necrosis: No Has patient had a PCN reaction that required hospitalization: No Has patient had a PCN reaction occurring within the last 10 years: No If all of the above answers are "NO", then may  proceed with Cephalosporin use.   . Levaquin [Levofloxacin]     Allergies as of 06/21/2020      Reactions   Codeine Rash   Penicillins Rash   Has patient had a PCN reaction causing immediate rash, facial/tongue/throat swelling, SOB or lightheadedness with hypotension: No Has patient had a PCN reaction causing severe rash involving mucus membranes or skin necrosis: No Has patient had a PCN reaction that required hospitalization: No Has patient had a PCN reaction occurring within the last 10 years: No If all of the above answers are "NO", then may proceed with Cephalosporin use.   Levaquin [levofloxacin]       Medication List       Accurate as of June 21, 2020  3:35 PM. If you have any questions, ask your nurse or doctor.        STOP taking these medications   ibuprofen 200 MG tablet Commonly known as: ADVIL Stopped by: Mata Rowen X Lorenza Winkleman, NP     TAKE these medications   amLODipine 2.5 MG tablet Commonly known as: NORVASC Take 1 tablet (2.5 mg total) by mouth daily.   aspirin 81 MG EC tablet Take 1 tablet (81 mg total) by mouth daily. Swallow whole.   atorvastatin 20 MG tablet Commonly known as: LIPITOR Take 1 tablet (20 mg total) by mouth daily.   CALCIUM 500/D PO Take 500 mg by mouth daily.   clopidogrel 75 MG tablet Commonly known as: PLAVIX Take 1 tablet (75 mg total) by mouth daily for 21 days.   diclofenac Sodium 1 % Gel Commonly known as: VOLTAREN Apply topically in the morning and at bedtime. To knee   ibandronate 150 MG tablet Commonly known as: BONIVA Take 150 mg by mouth every 30 (thirty) days. Take in the morning with a full glass of water, on an empty stomach, and do not take anything else by mouth or lie down for the next 30 min.   levothyroxine 75 MCG tablet Commonly known as: SYNTHROID Take 1 tablet (75 mcg total) by mouth daily before breakfast.   LORazepam 0.5 MG tablet Commonly known as: Ativan Take 0.5 tablets (0.25 mg total) by mouth 2 (two)  times daily as needed for anxiety.   omeprazole 20 MG capsule Commonly known as: PRILOSEC Take 20 mg by mouth daily as needed.   oxyCODONE 5 MG immediate release tablet Commonly known as: Oxy IR/ROXICODONE Take 5 mg by mouth every 4 (four) hours as needed for severe pain.   polyethylene glycol 17 g packet Commonly known as: MIRALAX / GLYCOLAX Take 17 g by mouth daily as needed.   PREVAGEN PO Take 10 mg by mouth daily.   saccharomyces boulardii 250 MG capsule Commonly known as: FLORASTOR Take 250 mg by mouth daily.   senna-docusate 8.6-50 MG tablet Commonly known as: Senokot-S Take 2 tablets by mouth 2 (two) times daily.   THERATEARS OP Place 1 drop into both eyes 2 (two) times daily.   traMADol 50 MG tablet Commonly known as: ULTRAM Take  50 mg by mouth every 6 (six) hours as needed for moderate pain.   TYLENOL EXTRA STRENGTH PO Take 500 mg by mouth in the morning and at bedtime. 2 tabs daily   Vitamin D (Cholecalciferol) 50 MCG (2000 UT) Caps Take 1 tablet by mouth daily.   zolpidem 5 MG tablet Commonly known as: AMBIEN Take 2.5 mg by mouth at bedtime as needed for sleep.       Review of Systems  Constitutional: Negative for activity change, appetite change, fatigue and fever.  HENT: Negative for congestion and voice change.   Eyes: Negative for visual disturbance.  Respiratory: Negative for cough.   Cardiovascular: Positive for leg swelling.       Trace edema BLE  Gastrointestinal: Negative for abdominal distention, abdominal pain and constipation.  Genitourinary: Negative for difficulty urinating, dysuria and urgency.  Musculoskeletal: Positive for arthralgias and gait problem. Negative for back pain.       Left hip pain, left groin, left thigh, chronic left knee pain-due for inj 03/21/20  Skin: Negative for color change.  Neurological: Negative for dizziness, speech difficulty, weakness and headaches.  Psychiatric/Behavioral: Positive for sleep disturbance.  Negative for behavioral problems and dysphoric mood. The patient is nervous/anxious.     Immunization History  Administered Date(s) Administered  . Influenza-Unspecified 09/01/2018  . Moderna SARS-COVID-2 Vaccination 12/04/2019, 01/01/2020  . Zoster Recombinat (Shingrix) 07/05/2018   Pertinent  Health Maintenance Due  Topic Date Due  . DEXA SCAN  Never done  . PNA vac Low Risk Adult (1 of 2 - PCV13) Never done  . INFLUENZA VACCINE  06/30/2020   Fall Risk  06/14/2020 04/26/2020  Falls in the past year? 0 1  Number falls in past yr: 0 0  Injury with Fall? - 1   Functional Status Survey:    Vitals:   06/21/20 1355  BP: (!) 130/76  Pulse: 64  Resp: 18  Temp: (!) 97.1 F (36.2 C)  SpO2: 96%  Weight: (!) 90 lb 6.4 oz (41 kg)  Height: 5' (1.524 m)   Body mass index is 17.66 kg/m. Physical Exam Constitutional:      Appearance: Normal appearance.  HENT:     Head: Normocephalic and atraumatic.     Mouth/Throat:     Mouth: Mucous membranes are moist.  Eyes:     Extraocular Movements: Extraocular movements intact.     Conjunctiva/sclera: Conjunctivae normal.     Pupils: Pupils are equal, round, and reactive to light.  Cardiovascular:     Rate and Rhythm: Normal rate and regular rhythm.     Heart sounds: No murmur heard.      Comments: Weak left DP pulse, more symptomatic tingling sensation at night.  Pulmonary:     Effort: Pulmonary effort is normal.     Breath sounds: Rales present.     Comments: Right base Abdominal:     General: Bowel sounds are normal.     Palpations: Abdomen is soft.     Tenderness: There is no abdominal tenderness. There is no right CVA tenderness or left CVA tenderness.  Musculoskeletal:     Cervical back: Normal range of motion and neck supple.     Right lower leg: No edema.     Left lower leg: Edema present.     Comments: Trace edema LLE, mild scoliosis. Left hip/groin/thigh/knee pain when walking/weight bearing  Skin:    General: Skin is  warm and dry.     Comments: S/p L hip IM nail.  Neurological:     General: No focal deficit present.     Mental Status: She is alert and oriented to person, place, and time. Mental status is at baseline.     Gait: Gait abnormal.     Comments: Using walker sometimes.   Psychiatric:        Mood and Affect: Mood normal.        Behavior: Behavior normal.        Thought Content: Thought content normal.        Judgment: Judgment normal.     Labs reviewed: Recent Labs    03/05/20 0437 06/13/20 0700 06/19/20 1248 06/19/20 2151 06/20/20 0421  NA  --  132* 130*  --  132*  K  --  4.5 4.3  --  3.9  CL  --  100 97*  --  100  CO2  --  26 23  --  25  GLUCOSE  --  87 109*  --  99  BUN  --  20 20  --  12  CREATININE   < > 0.67 0.71 0.65 0.65  CALCIUM  --  9.5 9.8  --  9.0   < > = values in this interval not displayed.   Recent Labs    03/20/20 0000 03/20/20 0000 04/17/20 0000 06/13/20 0700 06/19/20 1248  AST 16   < > 18 19 24   ALT 10   < > 10 13 14   ALKPHOS 75  --  72  --  55  BILITOT  --   --   --  0.6 0.6  PROT  --   --   --  6.6 6.9  ALBUMIN 3.3*  --  3.7  --  3.7   < > = values in this interval not displayed.   Recent Labs    03/20/20 0000 04/17/20 0000 06/19/20 1248 06/19/20 2151 06/20/20 0421  WBC 7.9   < > 7.1 6.0 6.3  NEUTROABS 5,419  --  4.6  --  3.6  HGB 9.6*   < > 12.7 12.6 12.0  HCT 29*   < > 39.4 38.9 36.6  MCV  --   --  98.5 97.5 95.8  PLT 933*   < > 471* 418* 397   < > = values in this interval not displayed.   Lab Results  Component Value Date   TSH 2.389 06/19/2020   Lab Results  Component Value Date   HGBA1C 6.1 (H) 06/20/2020   Lab Results  Component Value Date   CHOL 171 06/20/2020   HDL 59 06/20/2020   LDLCALC 94 06/20/2020   TRIG 89 06/20/2020   CHOLHDL 2.9 06/20/2020    Significant Diagnostic Results in last 30 days:  CT Angio Head W or Wo Contrast  Result Date: 06/19/2020 CLINICAL DATA:  Extremity weakness.  Stroke suspected.  EXAM: CT ANGIOGRAPHY HEAD AND NECK TECHNIQUE: Multidetector CT imaging of the head and neck was performed using the standard protocol during bolus administration of intravenous contrast. Multiplanar CT image reconstructions and MIPs were obtained to evaluate the vascular anatomy. Carotid stenosis measurements (when applicable) are obtained utilizing NASCET criteria, using the distal internal carotid diameter as the denominator. CONTRAST:  60mL OMNIPAQUE IOHEXOL 350 MG/ML SOLN COMPARISON:  CT head without contrast 06/19/2020 and 03/02/2020. FINDINGS: CTA NECK FINDINGS Aortic arch: Common origin of the left common carotid artery and innominate artery is noted. No significant calcifications are present at the aortic arch. No aneurysm or stenosis is present. Right  carotid system: The right common carotid artery is within normal limits. Bifurcation is unremarkable. Mild tortuosity is present cervical right ICA without significant stenosis. Left carotid system: The left common carotid artery is within normal limits. Minimal calcifications present bifurcation without significant stenosis. Mild tortuosity is present cervical left ICA without significant stenosis. Vertebral arteries: The vertebral arteries originate from the subclavian arteries bilaterally without significant stenosis. The left vertebral artery is the dominant vessel. No significant stenosis present in the neck. Skeleton: Cervical spine is fused C4-6 probable fusion is present at C6-7. Grade 2 anterolisthesis at C7-T1 measures 5 cm. Fusion is present at T1-2 and likely T2-3. Other neck: Soft tissues the neck are otherwise unremarkable. Upper chest: Scarring is noted at the lung apices bilaterally. Lungs are otherwise clear. Thoracic inlet is within normal limits. Review of the MIP images confirms the above findings CTA HEAD FINDINGS Anterior circulation: The internal carotid arteries are within normal limits through the ICA termini bilaterally. The A1 and M1  segments are normal. The A1 and M1 segments are normal. The anterior communicating artery is patent. The MCA bifurcations are intact bilaterally. ACA and MCA branch vessels are unremarkable. Posterior circulation: Left vertebral artery is dominant. PICA origins are visualized and normal. The vertebrobasilar junction is normal. Basilar artery is normal. Both posterior cerebral arteries originate from the basilar tip. The PCA branch vessels are within normal limits. Venous sinuses: The dural sinuses are patent. The straight sinus and deep cerebral veins are intact. Cortical veins are unremarkable. Anatomic variants: None Review of the MIP images confirms the above findings IMPRESSION: 1. No emergent large vessel occlusion. 2. Minimal atherosclerotic changes at the left carotid bifurcation without significant stenosis. 3. Mild tortuosity of the cervical internal carotid arteries bilaterally without significant stenosis. 4. Normal variant Circle of Willis without significant proximal stenosis, aneurysm, or branch vessel occlusion. 5. Grade 2 anterolisthesis at C7-T1 measures 5 cm. The cervical spine is fused above this level. Electronically Signed   By: San Morelle M.D.   On: 06/19/2020 17:04   CT Head Wo Contrast  Result Date: 06/19/2020 CLINICAL DATA:  Extremity weakness EXAM: CT HEAD WITHOUT CONTRAST TECHNIQUE: Contiguous axial images were obtained from the base of the skull through the vertex without intravenous contrast. COMPARISON:  CT 03/02/2020 FINDINGS: Brain: No evidence of acute infarction, hemorrhage, hydrocephalus, extra-axial collection or mass lesion/mass effect. Symmetric prominence of the ventricles, cisterns and sulci compatible with parenchymal volume loss. Patchy areas of white matter hypoattenuation are most compatible with chronic microvascular angiopathy. Vascular: Atherosclerotic calcification of the carotid siphons. No hyperdense vessel. Skull: No calvarial fracture or suspicious  osseous lesion. No scalp swelling or hematoma. Sinuses/Orbits: Paranasal sinuses and mastoid air cells are predominantly clear. Orbital structures are unremarkable aside from prior lens extractions. Other: None. IMPRESSION: 1. No acute intracranial findings. 2. Chronic microvascular angiopathy and parenchymal volume loss. Electronically Signed   By: Lovena Le M.D.   On: 06/19/2020 15:10   CT Angio Neck W and/or Wo Contrast  Result Date: 06/19/2020 CLINICAL DATA:  Extremity weakness.  Stroke suspected. EXAM: CT ANGIOGRAPHY HEAD AND NECK TECHNIQUE: Multidetector CT imaging of the head and neck was performed using the standard protocol during bolus administration of intravenous contrast. Multiplanar CT image reconstructions and MIPs were obtained to evaluate the vascular anatomy. Carotid stenosis measurements (when applicable) are obtained utilizing NASCET criteria, using the distal internal carotid diameter as the denominator. CONTRAST:  84mL OMNIPAQUE IOHEXOL 350 MG/ML SOLN COMPARISON:  CT head without contrast 06/19/2020  and 03/02/2020. FINDINGS: CTA NECK FINDINGS Aortic arch: Common origin of the left common carotid artery and innominate artery is noted. No significant calcifications are present at the aortic arch. No aneurysm or stenosis is present. Right carotid system: The right common carotid artery is within normal limits. Bifurcation is unremarkable. Mild tortuosity is present cervical right ICA without significant stenosis. Left carotid system: The left common carotid artery is within normal limits. Minimal calcifications present bifurcation without significant stenosis. Mild tortuosity is present cervical left ICA without significant stenosis. Vertebral arteries: The vertebral arteries originate from the subclavian arteries bilaterally without significant stenosis. The left vertebral artery is the dominant vessel. No significant stenosis present in the neck. Skeleton: Cervical spine is fused C4-6  probable fusion is present at C6-7. Grade 2 anterolisthesis at C7-T1 measures 5 cm. Fusion is present at T1-2 and likely T2-3. Other neck: Soft tissues the neck are otherwise unremarkable. Upper chest: Scarring is noted at the lung apices bilaterally. Lungs are otherwise clear. Thoracic inlet is within normal limits. Review of the MIP images confirms the above findings CTA HEAD FINDINGS Anterior circulation: The internal carotid arteries are within normal limits through the ICA termini bilaterally. The A1 and M1 segments are normal. The A1 and M1 segments are normal. The anterior communicating artery is patent. The MCA bifurcations are intact bilaterally. ACA and MCA branch vessels are unremarkable. Posterior circulation: Left vertebral artery is dominant. PICA origins are visualized and normal. The vertebrobasilar junction is normal. Basilar artery is normal. Both posterior cerebral arteries originate from the basilar tip. The PCA branch vessels are within normal limits. Venous sinuses: The dural sinuses are patent. The straight sinus and deep cerebral veins are intact. Cortical veins are unremarkable. Anatomic variants: None Review of the MIP images confirms the above findings IMPRESSION: 1. No emergent large vessel occlusion. 2. Minimal atherosclerotic changes at the left carotid bifurcation without significant stenosis. 3. Mild tortuosity of the cervical internal carotid arteries bilaterally without significant stenosis. 4. Normal variant Circle of Willis without significant proximal stenosis, aneurysm, or branch vessel occlusion. 5. Grade 2 anterolisthesis at C7-T1 measures 5 cm. The cervical spine is fused above this level. Electronically Signed   By: San Morelle M.D.   On: 06/19/2020 17:04   MR BRAIN WO CONTRAST  Result Date: 06/19/2020 CLINICAL DATA:  Follow-up examination for acute stroke. EXAM: MRI HEAD WITHOUT CONTRAST TECHNIQUE: Multiplanar, multiecho pulse sequences of the brain and  surrounding structures were obtained without intravenous contrast. COMPARISON:  Prior CTs from earlier the same day. FINDINGS: Brain: Cerebral volume within normal limits for age. Mild scattered T2/FLAIR hyperintensity seen involving the periventricular deep white matter both cerebral hemispheres, most like related chronic microvascular ischemic disease, fairly typical/normal for age. No abnormal foci of restricted diffusion to suggest acute or subacute ischemia. Gray-white matter differentiation maintained. No encephalomalacia to suggest chronic cortical infarction. No foci of susceptibility artifact to suggest acute or chronic intracranial hemorrhage. No mass lesion, midline shift or mass effect. No hydrocephalus or extra-axial fluid collection. Pituitary gland suprasellar region normal. Midline structures intact. Vascular: Major intracranial vascular flow voids are maintained. Skull and upper cervical spine: Degenerative osteoarthritic changes noted about the dens. Craniocervical junction otherwise unremarkable. Bone marrow signal intensity within normal limits. No scalp soft tissue abnormality. Sinuses/Orbits: Patient status post bilateral ocular lens replacement. Globes and orbital soft tissues demonstrate no acute finding. Paranasal sinuses are largely clear. No significant mastoid effusion. Inner ear structures grossly normal. Other: None. IMPRESSION: Normal brain MRI for  age. No acute intracranial infarct or other abnormality. Electronically Signed   By: Jeannine Boga M.D.   On: 06/19/2020 20:22   ECHOCARDIOGRAM COMPLETE  Result Date: 06/20/2020    ECHOCARDIOGRAM REPORT   Patient Name:   Sara Davila Date of Exam: 06/20/2020 Medical Rec #:  [id number]      Height:       60.0 in Accession #:    [id number]     Weight:       91.7 lb Date of Birth:  [birth year]     BSA:          1.340 m Patient Age:    88 years       BP:           135/80 mmHg Patient Gender: F              HR:           84 bpm. Exam  Location:  Inpatient Procedure: 2D Echo, Cardiac Doppler and Color Doppler Indications:    Stroke 434.91 / I163.9  History:        Patient has no prior history of Echocardiogram examinations.                 TIA; Risk Factors:Hypertension and Former Smoker. GERD.  Sonographer:    Vickie Epley RDCS Referring Phys: [id number] [place]  1. Left ventricular ejection fraction, by estimation, is 60 to 65%. The left ventricle has normal function. The left ventricle has no regional wall motion abnormalities. There is mild left ventricular hypertrophy. Left ventricular diastolic parameters were normal.  2. Right ventricular systolic function is normal. The right ventricular size is normal. There is normal pulmonary artery systolic pressure. The estimated right ventricular systolic pressure is 44.0 mmHg.  3. The mitral valve is normal in structure. Significant calcification of subvalvular apparatus. No evidence of mitral valve regurgitation.  4. The aortic valve is tricuspid. Aortic valve regurgitation is trivial. No aortic stenosis is present.  5. The inferior vena cava is normal in size with <50% respiratory variability, suggesting right atrial pressure of 8 mmHg. FINDINGS  Left Ventricle: Left ventricular ejection fraction, by estimation, is 60 to 65%. The left ventricle has normal function. The left ventricle has no regional wall motion abnormalities. The left ventricular internal cavity size was small. There is mild left ventricular hypertrophy. Left ventricular diastolic parameters were normal. Right Ventricle: The right ventricular size is normal. Right vetricular wall thickness was not assessed. Right ventricular systolic function is normal. There is normal pulmonary artery systolic pressure. The tricuspid regurgitant velocity is 2.28 m/s, and with an assumed right atrial pressure of 8 mmHg, the estimated right ventricular systolic pressure is 10.2 mmHg. Left Atrium: Left atrial size was normal in size.  Right Atrium: Right atrial size was normal in size. Pericardium: There is no evidence of pericardial effusion. Mitral Valve: The mitral valve is normal in structure. No evidence of mitral valve regurgitation. Tricuspid Valve: The tricuspid valve is normal in structure. Tricuspid valve regurgitation is trivial. Aortic Valve: The aortic valve is tricuspid. Aortic valve regurgitation is trivial. No aortic stenosis is present. Pulmonic Valve: The pulmonic valve was not well visualized. Pulmonic valve regurgitation is not visualized. Aorta: The aortic root is normal in size and structure. Venous: The inferior vena cava is normal in size with less than 50% respiratory variability, suggesting right atrial pressure of 8 mmHg. IAS/Shunts: The interatrial septum was not well visualized.  LEFT VENTRICLE PLAX  2D LVIDd:         3.30 cm     Diastology LVIDs:         2.30 cm     LV e' lateral:   6.20 cm/s LV PW:         0.70 cm     LV E/e' lateral: 10.2 LV IVS:        0.70 cm     LV e' medial:    5.11 cm/s LVOT diam:     1.80 cm     LV E/e' medial:  12.3 LV SV:         40 LV SV Index:   30 LVOT Area:     2.54 cm  LV Volumes (MOD) LV vol d, MOD A2C: 42.7 ml LV vol d, MOD A4C: 65.8 ml LV vol s, MOD A2C: 18.2 ml LV vol s, MOD A4C: 23.1 ml LV SV MOD A2C:     24.5 ml LV SV MOD A4C:     65.8 ml LV SV MOD BP:      33.4 ml RIGHT VENTRICLE RV S prime:     10.60 cm/s TAPSE (M-mode): 1.8 cm LEFT ATRIUM             Index      RIGHT ATRIUM          Index LA diam:        2.50 cm 1.87 cm/m RA Area:     6.17 cm LA Vol (A2C):   9.8 ml  7.31 ml/m RA Volume:   8.97 ml  6.69 ml/m LA Vol (A4C):   11.1 ml 8.28 ml/m LA Biplane Vol: 11.1 ml 8.28 ml/m  AORTIC VALVE LVOT Vmax:   73.80 cm/s LVOT Vmean:  55.200 cm/s LVOT VTI:    0.158 m  AORTA Ao Root diam: 3.10 cm MITRAL VALVE               TRICUSPID VALVE MV Area (PHT): 3.77 cm    TR Peak grad:   20.8 mmHg MV Decel Time: 201 msec    TR Vmax:        228.00 cm/s MV E velocity: 63.00 cm/s MV A  velocity: 98.50 cm/s  SHUNTS MV E/A ratio:  0.64        Systemic VTI:  0.16 m                            Systemic Diam: 1.80 cm Oswaldo Milian MD Electronically signed by Oswaldo Milian MD Signature Date/Time: 06/20/2020/3:06:01 PM    Final     Assessment/Plan: TIA (transient ischemic attack) hospital stay 7/21-7/22 for left sided weakness, resolved upon arrival of ED. CT head negative forany acute intracranialfindings. MRI brain no acute abnormality. LDL elevated 94 with a goal of less than 70, started on Lipitor 20 mg daily for TIA. Underwent neurology consult, will f/u as outpatient. Continue Plavix and ASA for 3 weeks, then ASA only.  [facility] for care, therapy.   GERD (gastroesophageal reflux disease) Stable, continue Omeprazole.   Slow transit constipation Stable, continue  Senokot S II bid, prn MiraLax.    Hypothyroidism Stable, continue Levothyroxine, TSH 2.3 06/19/20   Neuropathy Stable continue Tylenol 500mg  bid, prn Tramadol.    Osteoporosis  Continue Boniva, Vit D, Ca. Last DEXA about a year ago.    Thrombocytosis ([place]) resolved, plt 397  Hyponatremia off HCTZ, Na 132 06/20/20   Insomnia secondary to  anxiety takes Lorazepam, Ambien   HTN (hypertension), benign Blood pressure is controlled, continue Amlodipine 2.5mg  qd.     Family/ staff Communication: plan of care reviewed with the patient and charge nurse.   Labs/tests ordered:  None  Time spend 40 minutes.

## 2020-06-21 NOTE — Assessment & Plan Note (Signed)
Continue Boniva, Vit D, Ca. Last DEXA about a year ago.

## 2020-06-21 NOTE — Assessment & Plan Note (Signed)
Stable, continue Omeprazole.  

## 2020-06-21 NOTE — Assessment & Plan Note (Signed)
Blood pressure is controlled, continue Amlodipine 2.5mg  qd.

## 2020-06-21 NOTE — Assessment & Plan Note (Signed)
takes Lorazepam, Ambien

## 2020-06-21 NOTE — Assessment & Plan Note (Signed)
Stable, continue Levothyroxine, TSH 2.3 06/19/20

## 2020-06-21 NOTE — Assessment & Plan Note (Signed)
resolved, plt 397

## 2020-06-21 NOTE — Assessment & Plan Note (Signed)
hospital stay 7/21-7/22 for left sided weakness, resolved upon arrival of ED. CT head negative forany acute intracranialfindings. MRI brain no acute abnormality. LDL elevated 94 with a goal of less than 70, started on Lipitor 20 mg daily for TIA. Underwent neurology consult, will f/u as outpatient. Continue Plavix and ASA for 3 weeks, then ASA only.  [facility] for care, therapy.

## 2020-06-24 ENCOUNTER — Other Ambulatory Visit: Payer: Self-pay | Admitting: *Deleted

## 2020-06-24 MED ORDER — ZOLPIDEM TARTRATE 5 MG PO TABS: 2.5000 mg | ORAL_TABLET | Freq: Every evening | ORAL | 0 refills | Status: DC | PRN

## 2020-06-24 NOTE — Telephone Encounter (Signed)
Received refill Request from pharmacy P[facility] and sent to [facility] for approval.

## 2020-07-11 ENCOUNTER — Other Ambulatory Visit: Payer: Self-pay

## 2020-07-11 ENCOUNTER — Encounter (HOSPITAL_COMMUNITY): Payer: Self-pay | Admitting: Emergency Medicine

## 2020-07-11 ENCOUNTER — Observation Stay (HOSPITAL_COMMUNITY): Payer: Medicare Other

## 2020-07-11 ENCOUNTER — Emergency Department (HOSPITAL_COMMUNITY): Payer: Medicare Other

## 2020-07-11 ENCOUNTER — Inpatient Hospital Stay (HOSPITAL_COMMUNITY)
Admission: EM | Admit: 2020-07-11 | Discharge: 2020-07-18 | DRG: 101 | Disposition: A | Discharge status: Home Health | Payer: Medicare Other | Attending: Internal Medicine | Admitting: Internal Medicine

## 2020-07-11 DIAGNOSIS — Z880 Allergy status to penicillin: Secondary | ICD-10-CM

## 2020-07-11 DIAGNOSIS — Z981 Arthrodesis status: Secondary | ICD-10-CM

## 2020-07-11 DIAGNOSIS — F419 Anxiety disorder, unspecified: Secondary | ICD-10-CM | POA: Diagnosis present

## 2020-07-11 DIAGNOSIS — Z66 Do not resuscitate: Secondary | ICD-10-CM | POA: Diagnosis present

## 2020-07-11 DIAGNOSIS — R569 Unspecified convulsions: Principal | ICD-10-CM | POA: Diagnosis present

## 2020-07-11 DIAGNOSIS — R059 Cough, unspecified: Secondary | ICD-10-CM

## 2020-07-11 DIAGNOSIS — E861 Hypovolemia: Secondary | ICD-10-CM | POA: Diagnosis present

## 2020-07-11 DIAGNOSIS — M797 Fibromyalgia: Secondary | ICD-10-CM | POA: Diagnosis present

## 2020-07-11 DIAGNOSIS — Z881 Allergy status to other antibiotic agents status: Secondary | ICD-10-CM

## 2020-07-11 DIAGNOSIS — R05 Cough: Secondary | ICD-10-CM

## 2020-07-11 DIAGNOSIS — E039 Hypothyroidism, unspecified: Secondary | ICD-10-CM | POA: Diagnosis present

## 2020-07-11 DIAGNOSIS — E871 Hypo-osmolality and hyponatremia: Secondary | ICD-10-CM | POA: Diagnosis not present

## 2020-07-11 DIAGNOSIS — Z7983 Long term (current) use of bisphosphonates: Secondary | ICD-10-CM

## 2020-07-11 DIAGNOSIS — Z79899 Other long term (current) drug therapy: Secondary | ICD-10-CM

## 2020-07-11 DIAGNOSIS — Z823 Family history of stroke: Secondary | ICD-10-CM

## 2020-07-11 DIAGNOSIS — K219 Gastro-esophageal reflux disease without esophagitis: Secondary | ICD-10-CM | POA: Diagnosis present

## 2020-07-11 DIAGNOSIS — E876 Hypokalemia: Secondary | ICD-10-CM | POA: Diagnosis not present

## 2020-07-11 DIAGNOSIS — E785 Hyperlipidemia, unspecified: Secondary | ICD-10-CM | POA: Diagnosis present

## 2020-07-11 DIAGNOSIS — Z7982 Long term (current) use of aspirin: Secondary | ICD-10-CM

## 2020-07-11 DIAGNOSIS — Z7902 Long term (current) use of antithrombotics/antiplatelets: Secondary | ICD-10-CM

## 2020-07-11 DIAGNOSIS — I10 Essential (primary) hypertension: Secondary | ICD-10-CM | POA: Diagnosis not present

## 2020-07-11 DIAGNOSIS — Z8249 Family history of ischemic heart disease and other diseases of the circulatory system: Secondary | ICD-10-CM

## 2020-07-11 DIAGNOSIS — R531 Weakness: Secondary | ICD-10-CM | POA: Insufficient documentation

## 2020-07-11 DIAGNOSIS — Z20822 Contact with and (suspected) exposure to covid-19: Secondary | ICD-10-CM | POA: Diagnosis present

## 2020-07-11 DIAGNOSIS — D75839 Thrombocytosis, unspecified: Secondary | ICD-10-CM | POA: Diagnosis present

## 2020-07-11 DIAGNOSIS — Z87891 Personal history of nicotine dependence: Secondary | ICD-10-CM

## 2020-07-11 DIAGNOSIS — R200 Anesthesia of skin: Secondary | ICD-10-CM

## 2020-07-11 DIAGNOSIS — Z825 Family history of asthma and other chronic lower respiratory diseases: Secondary | ICD-10-CM

## 2020-07-11 DIAGNOSIS — D473 Essential (hemorrhagic) thrombocythemia: Secondary | ICD-10-CM

## 2020-07-11 DIAGNOSIS — Z885 Allergy status to narcotic agent status: Secondary | ICD-10-CM

## 2020-07-11 DIAGNOSIS — K5901 Slow transit constipation: Secondary | ICD-10-CM | POA: Diagnosis present

## 2020-07-11 DIAGNOSIS — Z8673 Personal history of transient ischemic attack (TIA), and cerebral infarction without residual deficits: Secondary | ICD-10-CM

## 2020-07-11 DIAGNOSIS — E032 Hypothyroidism due to medicaments and other exogenous substances: Secondary | ICD-10-CM

## 2020-07-11 DIAGNOSIS — G4700 Insomnia, unspecified: Secondary | ICD-10-CM | POA: Diagnosis present

## 2020-07-11 DIAGNOSIS — Z803 Family history of malignant neoplasm of breast: Secondary | ICD-10-CM

## 2020-07-11 DIAGNOSIS — M109 Gout, unspecified: Secondary | ICD-10-CM | POA: Diagnosis present

## 2020-07-11 DIAGNOSIS — Z79890 Hormone replacement therapy (postmenopausal): Secondary | ICD-10-CM

## 2020-07-11 LAB — DIFFERENTIAL
Abs Immature Granulocytes: 0.04 10*3/uL (ref 0.00–0.07)
Basophils Absolute: 0.1 10*3/uL (ref 0.0–0.1)
Basophils Relative: 1 %
Eosinophils Absolute: 0.1 10*3/uL (ref 0.0–0.5)
Eosinophils Relative: 2 %
Immature Granulocytes: 1 %
Lymphocytes Relative: 10 %
Lymphs Abs: 0.7 10*3/uL (ref 0.7–4.0)
Monocytes Absolute: 0.7 10*3/uL (ref 0.1–1.0)
Monocytes Relative: 10 %
Neutro Abs: 5.5 10*3/uL (ref 1.7–7.7)
Neutrophils Relative %: 76 %

## 2020-07-11 LAB — COMPREHENSIVE METABOLIC PANEL
ALT: 19 U/L (ref 0–44)
AST: 24 U/L (ref 15–41)
Albumin: 3.9 g/dL (ref 3.5–5.0)
Alkaline Phosphatase: 62 U/L (ref 38–126)
Anion gap: 9 (ref 5–15)
BUN: 9 mg/dL (ref 8–23)
CO2: 26 mmol/L (ref 22–32)
Calcium: 10.2 mg/dL (ref 8.9–10.3)
Chloride: 92 mmol/L — ABNORMAL LOW (ref 98–111)
Creatinine, Ser: 0.65 mg/dL (ref 0.44–1.00)
GFR calc Af Amer: >60 mL/min (ref >60)
GFR calc non Af Amer: >60 mL/min (ref >60)
Glucose, Bld: 112 mg/dL — ABNORMAL HIGH (ref 70–99)
Potassium: 4.2 mmol/L (ref 3.5–5.1)
Sodium: 127 mmol/L — ABNORMAL LOW (ref 135–145)
Total Bilirubin: 0.8 mg/dL (ref 0.3–1.2)
Total Protein: 6.8 g/dL (ref 6.5–8.1)

## 2020-07-11 LAB — RAPID URINE DRUG SCREEN, HOSP PERFORMED
Amphetamines: NOT DETECTED
Barbiturates: NOT DETECTED
Benzodiazepines: NOT DETECTED
Cocaine: NOT DETECTED
Opiates: NOT DETECTED
Tetrahydrocannabinol: NOT DETECTED

## 2020-07-11 LAB — CBG MONITORING, ED: Glucose-Capillary: 101 mg/dL — ABNORMAL HIGH (ref 70–99)

## 2020-07-11 LAB — PROTIME-INR
INR: 1 (ref 0.8–1.2)
Prothrombin Time: 12.4 seconds (ref 11.4–15.2)

## 2020-07-11 LAB — URINALYSIS, ROUTINE W REFLEX MICROSCOPIC
Bilirubin Urine: NEGATIVE
Glucose, UA: NEGATIVE mg/dL
Hgb urine dipstick: NEGATIVE
Ketones, ur: NEGATIVE mg/dL
Leukocytes,Ua: NEGATIVE
Nitrite: NEGATIVE
Protein, ur: NEGATIVE mg/dL
Specific Gravity, Urine: 1.005 (ref 1.005–1.030)
pH: 8 (ref 5.0–8.0)

## 2020-07-11 LAB — CBC
HCT: 40.3 % (ref 36.0–46.0)
Hemoglobin: 13.2 g/dL (ref 12.0–15.0)
MCH: 31.4 pg (ref 26.0–34.0)
MCHC: 32.8 g/dL (ref 30.0–36.0)
MCV: 96 fL (ref 80.0–100.0)
Platelets: 521 10*3/uL — ABNORMAL HIGH (ref 150–400)
RBC: 4.2 MIL/uL (ref 3.87–5.11)
RDW: 13 % (ref 11.5–15.5)
WBC: 7.1 10*3/uL (ref 4.0–10.5)
nRBC: 0 % (ref 0.0–0.2)

## 2020-07-11 LAB — APTT: aPTT: 28 seconds (ref 24–36)

## 2020-07-11 LAB — SARS CORONAVIRUS 2 BY RT PCR (HOSPITAL ORDER, PERFORMED IN ~~LOC~~ HOSPITAL LAB): SARS Coronavirus 2: NEGATIVE

## 2020-07-11 MED ORDER — CLOPIDOGREL BISULFATE 75 MG PO TABS
75.0000 mg | ORAL_TABLET | Freq: Every day | ORAL | Status: DC
  Administered 2020-07-12 – 2020-07-18 (×7): 75 mg via ORAL
  Filled 2020-07-11 (×7): qty 1

## 2020-07-11 MED ORDER — SACCHAROMYCES BOULARDII 250 MG PO CAPS
250.0000 mg | ORAL_CAPSULE | Freq: Every day | ORAL | Status: DC
  Administered 2020-07-11 – 2020-07-18 (×8): 250 mg via ORAL
  Filled 2020-07-11 (×8): qty 1

## 2020-07-11 MED ORDER — SODIUM CHLORIDE 0.9 % IV SOLN
750.0000 mg | Freq: Two times a day (BID) | INTRAVENOUS | Status: DC
  Administered 2020-07-11 – 2020-07-13 (×5): 750 mg via INTRAVENOUS
  Filled 2020-07-11 (×7): qty 7.5

## 2020-07-11 MED ORDER — MUPIROCIN 2 % EX OINT
1.0000 "application " | TOPICAL_OINTMENT | Freq: Every day | CUTANEOUS | Status: DC | PRN
  Administered 2020-07-11: 1 via TOPICAL
  Filled 2020-07-11: qty 22

## 2020-07-11 MED ORDER — SENNOSIDES-DOCUSATE SODIUM 8.6-50 MG PO TABS
2.0000 | ORAL_TABLET | Freq: Two times a day (BID) | ORAL | Status: DC
  Administered 2020-07-11 – 2020-07-17 (×9): 2 via ORAL
  Filled 2020-07-11 (×11): qty 2

## 2020-07-11 MED ORDER — PANTOPRAZOLE SODIUM 40 MG PO TBEC
40.0000 mg | DELAYED_RELEASE_TABLET | Freq: Every day | ORAL | Status: DC
  Administered 2020-07-12 – 2020-07-18 (×7): 40 mg via ORAL
  Filled 2020-07-11 (×7): qty 1

## 2020-07-11 MED ORDER — ATORVASTATIN CALCIUM 10 MG PO TABS
20.0000 mg | ORAL_TABLET | Freq: Every day | ORAL | Status: DC
  Administered 2020-07-12 – 2020-07-18 (×7): 20 mg via ORAL
  Filled 2020-07-11 (×7): qty 2

## 2020-07-11 MED ORDER — GADOBUTROL 1 MMOL/ML IV SOLN
4.0000 mL | Freq: Once | INTRAVENOUS | Status: AC | PRN
  Administered 2020-07-11: 4 mL via INTRAVENOUS

## 2020-07-11 MED ORDER — ZOLPIDEM TARTRATE 5 MG PO TABS
2.5000 mg | ORAL_TABLET | Freq: Two times a day (BID) | ORAL | Status: DC | PRN
  Administered 2020-07-15 – 2020-07-17 (×3): 2.5 mg via ORAL
  Filled 2020-07-11 (×3): qty 1

## 2020-07-11 MED ORDER — SODIUM CHLORIDE 0.9 % IV SOLN: Freq: Once | INTRAVENOUS | Status: AC

## 2020-07-11 MED ORDER — ENOXAPARIN SODIUM 40 MG/0.4ML ~~LOC~~ SOLN
40.0000 mg | SUBCUTANEOUS | Status: DC
  Administered 2020-07-11 – 2020-07-17 (×7): 40 mg via SUBCUTANEOUS
  Filled 2020-07-11 (×7): qty 0.4

## 2020-07-11 MED ORDER — ACETAMINOPHEN 650 MG RE SUPP: 650.0000 mg | RECTAL | Status: DC | PRN

## 2020-07-11 MED ORDER — LEVOTHYROXINE SODIUM 75 MCG PO TABS
75.0000 ug | ORAL_TABLET | Freq: Every day | ORAL | Status: DC
  Administered 2020-07-12 – 2020-07-18 (×7): 75 ug via ORAL
  Filled 2020-07-11 (×7): qty 1

## 2020-07-11 MED ORDER — SODIUM CHLORIDE 0.9% FLUSH: 3.0000 mL | Freq: Once | INTRAVENOUS | Status: DC

## 2020-07-11 MED ORDER — STROKE: EARLY STAGES OF RECOVERY BOOK
Freq: Once | Status: AC
  Filled 2020-07-11 (×2): qty 1

## 2020-07-11 MED ORDER — ASPIRIN EC 81 MG PO TBEC
81.0000 mg | DELAYED_RELEASE_TABLET | Freq: Every day | ORAL | Status: DC
  Administered 2020-07-12 – 2020-07-18 (×7): 81 mg via ORAL
  Filled 2020-07-11 (×7): qty 1

## 2020-07-11 MED ORDER — POLYETHYLENE GLYCOL 3350 17 G PO PACK
17.0000 g | PACK | Freq: Every day | ORAL | Status: DC
  Administered 2020-07-13 – 2020-07-17 (×3): 17 g via ORAL
  Filled 2020-07-11 (×5): qty 1

## 2020-07-11 MED ORDER — IOHEXOL 350 MG/ML SOLN
100.0000 mL | Freq: Once | INTRAVENOUS | Status: AC | PRN
  Administered 2020-07-11: 100 mL via INTRAVENOUS

## 2020-07-11 MED ORDER — LORAZEPAM 0.5 MG PO TABS
0.2500 mg | ORAL_TABLET | Freq: Two times a day (BID) | ORAL | Status: DC | PRN
  Administered 2020-07-11 – 2020-07-17 (×4): 0.25 mg via ORAL
  Filled 2020-07-11 (×4): qty 1

## 2020-07-11 MED ORDER — ACETAMINOPHEN 325 MG PO TABS
650.0000 mg | ORAL_TABLET | ORAL | Status: DC | PRN
  Administered 2020-07-11 – 2020-07-16 (×5): 650 mg via ORAL
  Filled 2020-07-11 (×5): qty 2

## 2020-07-11 MED ORDER — DICLOFENAC SODIUM 1 % EX GEL
2.0000 g | Freq: Every day | CUTANEOUS | Status: DC
  Administered 2020-07-12 – 2020-07-15 (×4): 2 g via TOPICAL
  Filled 2020-07-11: qty 100

## 2020-07-11 MED ORDER — ACETAMINOPHEN 160 MG/5ML PO SOLN: 650.0000 mg | ORAL | Status: DC | PRN

## 2020-07-11 NOTE — Hospital Course (Signed)
L - sided weakness yesterday at 2 PM (can't feel fingers)

## 2020-07-11 NOTE — ED Provider Notes (Signed)
[place] [facility] EMERGENCY DEPARTMENT Provider Note   CSN: [id number] Arrival date & time: 07/11/20  1660     History Chief Complaint  Patient presents with  . Weakness    Sara Davila is a 86 y.o. female presenting for evaluation of weakness and left arm numbness.  Patient states yesterday around 2 when she was playing cards her left hand and arm was not working the way it was supposed to.  This lasted all afternoon, but improved this morning.  Patient states this morning she could not get out of bed and put on her make-up as normal due to extreme weakness.  She reports a mild headache that began yesterday afternoon.  She denies vision changes, slurred speech, fall, trauma, injury, fevers, chills, chest pain, shortness breath, cough, nausea, vomiting, abdominal pain, urinary symptoms, normal bowel movements.  Patient states she has not had anything to eat since the day before yesterday.  She is at a facility, has been taking all of her medicines as prescribed.  Additional history taken chart review.  Patient with a history of anxiety, arthritis, fibromyalgia, GERD, hypothyroidism, TIA. Pt was recently admitted for TIA work-up, had negative MRI negative echo.  Started on clopidogrel, statin, aspirin.  HPI     Past Medical History:  Diagnosis Date  . Abdominal pain 02/26/2017  . Anxiety   . Arthritis    Left Knee, Wrist, Back and Hips  . Cervical vertebral fusion   . Diverticulitis   . Diverticulosis   . Fibromyalgia   . GERD (gastroesophageal reflux disease)   . Gout 02/2018   L hand  . Hypothyroidism   . Pelvis fracture ([place])   . Peritoneal free air 03/29/2012  . Pneumonia   . Pneumoperitoneum 02/26/2017  . Thyroid disorder   . TIA (transient ischemic attack)   . Toe pain 09/06/2016    Patient Active Problem List   Diagnosis Date Noted  . Left-sided weakness 07/11/2020  . TIA (transient ischemic attack) 06/20/2020  . CVA (cerebral vascular accident)  ([place]) 06/19/2020  . Asymptomatic bacteriuria 06/19/2020  . HTN (hypertension), benign 06/19/2020  . Thrombocytosis ([place]) 06/19/2020  . Left hip pain 04/15/2020  . Left knee pain 03/20/2020  . Slow transit constipation 03/20/2020  . Left foot pain 03/18/2020  . Urinary retention 03/06/2020  . Blood loss anemia 03/06/2020  . Displaced intertrochanteric fracture of left femur, initial encounter for closed fracture ([place]) 03/03/2020  . Prediabetes 01/04/2020  . Memory deficit 01/04/2020  . Osteoporosis 01/04/2020  . History of vasculitis 01/04/2020  . Venous insufficiency 01/04/2020  . Neuropathy 03/30/2018  . Hyponatremia 07/21/2017  . Insomnia secondary to anxiety 07/21/2017  . GERD (gastroesophageal reflux disease) 07/21/2017  . S/P small bowel resection 07/09/2017  . Hypothyroidism 02/26/2017  . Paresthesias 09/06/2016  . Fall 03/29/2012    Past Surgical History:  Procedure Laterality Date  . ABDOMINAL HYSTERECTOMY    . BOWEL RESECTION N/A 07/09/2017   Procedure: SMALL BOWEL RESECTION;  Surgeon: Johnathan Hausen, MD;  Location: [facility];  Service: General;  Laterality: N/A;  . CERVICAL FUSION     x2  . FEMUR IM NAIL Left 03/03/2020   Procedure: INTRAMEDULLARY (IM) NAIL FEMORAL;  Surgeon: Paralee Cancel, MD;  Location: [facility];  Service: Orthopedics;  Laterality: Left;  . LAPAROSCOPIC APPENDECTOMY N/A 03/04/2017   Procedure: APPENDECTOMY LAPAROSCOPIC;  Surgeon: Johnathan Hausen, MD;  Location: [facility];  Service: General;  Laterality: N/A;  . LAPAROSCOPY N/A 03/04/2017   Procedure: LAPAROSCOPY DIAGNOSTIC,  ENTEROLYSIS;  Surgeon: Johnathan Hausen, MD;  Location: [facility];  Service: General;  Laterality: N/A;  . LAPAROTOMY N/A 07/09/2017   Procedure: EXPLORATORY LAPAROTOMY;  Surgeon: Johnathan Hausen, MD;  Location: [facility];  Service: General;  Laterality: N/A;  . SPINE SURGERY     Lumbar- rod placement  . TONSILLECTOMY     86y/o  . TOTAL VAGINAL HYSTERECTOMY       OB History   No obstetric  history on file.     Family History  Problem Relation Age of Onset  . Asthma Mother   . Pulmonary embolism Mother   . Macular degeneration Mother   . Heart disease Father   . Stroke Father   . Stroke Paternal Uncle   . Breast cancer Maternal Aunt   . Macular degeneration Sister   . Stroke Sister   . Neuropathy Neg Hx     Social History   Tobacco Use  . Smoking status: Former Research scientist (life sciences)  . Smokeless tobacco: Never Used  . Tobacco comment: Smoked from age [age]  Vaping Use  . Vaping Use: Never used  Substance Use Topics  . Alcohol use: Yes    Comment: occasional glass of wine  . Drug use: No    Home Medications Prior to Admission medications   Medication Sig Start Date End Date Taking? Authorizing Provider  Acetaminophen (TYLENOL EXTRA STRENGTH PO) Take 500 mg by mouth in the morning and at bedtime. 2 tabs daily    [provider]  amLODipine (NORVASC) 2.5 MG tablet Take 1 tablet (2.5 mg total) by mouth daily. 06/20/20 08/19/20  Kayleen Memos, DO  Apoaequorin (PREVAGEN PO) Take 10 mg by mouth daily.     [provider]  aspirin EC 81 MG EC tablet Take 1 tablet (81 mg total) by mouth daily. Swallow whole. 06/21/20 06/16/21  Kayleen Memos, DO  atorvastatin (LIPITOR) 20 MG tablet Take 1 tablet (20 mg total) by mouth daily. 06/21/20 09/19/20  Kayleen Memos, DO  Calcium Carbonate-Vitamin D (CALCIUM 500/D PO) Take 500 mg by mouth daily.    [provider]  Carboxymethylcellulose Sodium (THERATEARS OP) Place 1 drop into both eyes 2 (two) times daily.     [provider]  clopidogrel (PLAVIX) 75 MG tablet Take 1 tablet (75 mg total) by mouth daily for 21 days. 06/21/20 07/12/20  Kayleen Memos, DO  diclofenac Sodium (VOLTAREN) 1 % GEL Apply topically in the morning and at bedtime. To knee     [provider]  ibandronate (BONIVA) 150 MG tablet Take 150 mg by mouth every 30 (thirty) days. Take in the morning with a full glass of water, on an empty  stomach, and do not take anything else by mouth or lie down for the next 30 min.    [provider]  levothyroxine (SYNTHROID) 75 MCG tablet Take 1 tablet (75 mcg total) by mouth daily before breakfast. 06/15/20   [name], MD  LORazepam (ATIVAN) 0.5 MG tablet Take 0.5 tablets (0.25 mg total) by mouth 2 (two) times daily as needed for anxiety. 03/12/20   [name], MD  omeprazole (PRILOSEC) 20 MG capsule Take 20 mg by mouth daily as needed.     [provider]  oxyCODONE (OXY IR/ROXICODONE) 5 MG immediate release tablet Take 5 mg by mouth every 4 (four) hours as needed for severe pain.    [provider]  polyethylene glycol (MIRALAX / GLYCOLAX) 17 g packet Take 17 g  by mouth daily as needed.    [provider]  saccharomyces boulardii (FLORASTOR) 250 MG capsule Take 250 mg by mouth daily.     [provider]  senna-docusate (SENOKOT-S) 8.6-50 MG tablet Take 2 tablets by mouth 2 (two) times daily. 03/05/20   Shelly Coss, MD  traMADol (ULTRAM) 50 MG tablet Take 50 mg by mouth every 6 (six) hours as needed for moderate pain.     [provider]  Vitamin D, Cholecalciferol, 50 MCG (2000 UT) CAPS Take 1 tablet by mouth daily.    [provider]  zolpidem (AMBIEN) 5 MG tablet Take 0.5 tablets (2.5 mg total) by mouth at bedtime as needed for sleep. 06/24/20   Mast, Man X, NP    Allergies    Codeine, Penicillins, and Levaquin [levofloxacin]  Review of Systems   Review of Systems  Neurological: Positive for weakness, numbness and headaches.  All other systems reviewed and are negative.   Physical Exam Updated Vital Signs BP (!) 162/94   Pulse 91   Temp 98.3 F (36.8 C) (Oral)   Resp 13   SpO2 96%   Physical Exam Vitals and nursing note reviewed.  Constitutional:      General: She is not in acute distress.    Appearance: She is well-developed.     Comments: Pt is very tired and keeping eyes closed, but will respond  to questions appropriately.   HENT:     Head: Normocephalic and atraumatic.  Eyes:     Extraocular Movements: Extraocular movements intact.     Conjunctiva/sclera: Conjunctivae normal.     Pupils: Pupils are equal, round, and reactive to light.  Cardiovascular:     Rate and Rhythm: Normal rate and regular rhythm.     Pulses: Normal pulses.  Pulmonary:     Effort: Pulmonary effort is normal. No respiratory distress.     Breath sounds: Normal breath sounds. No wheezing.  Abdominal:     General: There is no distension.     Palpations: Abdomen is soft. There is no mass.     Tenderness: There is no abdominal tenderness. There is no guarding or rebound.  Musculoskeletal:        General: Normal range of motion.     Cervical back: Normal range of motion and neck supple.  Skin:    General: Skin is warm and dry.     Capillary Refill: Capillary refill takes less than 2 seconds.  Neurological:     Mental Status: She is oriented to person, place, and time. She is lethargic.     GCS: GCS eye subscore is 4. GCS verbal subscore is 5. GCS motor subscore is 6.     Cranial Nerves: Cranial nerves are intact.     Sensory: Sensory deficit present.     Comments: Patient unable to feel that my fingers are touching herr L hand for assessment of grip strength     ED Results / Procedures / Treatments   Labs (all labs ordered are listed, but only abnormal results are displayed) Labs Reviewed  CBC - Abnormal; Notable for the following components:      Result Value   Platelets 521 (*)    All other components within normal limits  COMPREHENSIVE METABOLIC PANEL - Abnormal; Notable for the following components:   Sodium 127 (*)    Chloride 92 (*)    Glucose, Bld 112 (*)    All other components within normal limits  URINALYSIS, ROUTINE W REFLEX  MICROSCOPIC - Abnormal; Notable for the following components:   Color, Urine STRAW (*)    All other components within normal limits  CBG MONITORING, ED -  Abnormal; Notable for the following components:   Glucose-Capillary 101 (*)    All other components within normal limits  URINE CULTURE  SARS CORONAVIRUS 2 BY RT PCR (HOSPITAL ORDER, [facility])  PROTIME-INR  APTT  DIFFERENTIAL  RAPID URINE DRUG SCREEN, HOSP PERFORMED  I-STAT CHEM 8, ED    EKG EKG Interpretation  Date/Time:  Thursday July 11 2020 09:14:48 EDT Ventricular Rate:  88 PR Interval:    QRS Duration: 79 QT Interval:  348 QTC Calculation: 421 R Axis:   48 Text Interpretation: Sinus rhythm Atrial premature complex Probable left atrial enlargement Nonspecific T abnrm, anterolateral leads Confirmed by Pattricia Boss [phone]) on 07/11/2020 11:43:57 AM   Radiology CT Code Stroke CTA Head W/WO contrast  Result Date: 07/11/2020 CLINICAL DATA:  Headache. Blurred vision. Left upper extremity weakness. EXAM: CT ANGIOGRAPHY HEAD AND NECK CT PERFUSION BRAIN TECHNIQUE: Multidetector CT imaging of the head and neck was performed using the standard protocol during bolus administration of intravenous contrast. Multiplanar CT image reconstructions and MIPs were obtained to evaluate the vascular anatomy. Carotid stenosis measurements (when applicable) are obtained utilizing NASCET criteria, using the distal internal carotid diameter as the denominator. Multiphase CT imaging of the brain was performed following IV bolus contrast injection. Subsequent parametric perfusion maps were calculated using RAPID software. CONTRAST:  118mL OMNIPAQUE IOHEXOL 350 MG/ML SOLN COMPARISON:  Head CT earlier same day. FINDINGS: CTA NECK FINDINGS Aortic arch: Aortic atherosclerosis and tortuosity. No aneurysm or dissection. Branching pattern is normal without origin stenosis. Right carotid system: Common carotid artery widely patent to the bifurcation. Carotid bifurcation is widely patent without soft or calcified plaque. Cervical ICA widely patent. Left carotid system: Common carotid artery  widely patent to the bifurcation. Minimal calcified plaque at the carotid bifurcation but no stenosis. Cervical ICA widely patent. Vertebral arteries: Both vertebral artery origins are widely patent. Both vertebral arteries appeared widely patent and normal through the cervical region to the foramen magnum. Skeleton: Distant cervical fusion. Chronic degenerative anterolisthesis at C7-T1. Old minor superior endplate deformity at T6. Other neck: No mass or lymphadenopathy. Upper chest:  Scarring at the lung apices. No active process. Review of the MIP images confirms the above findings CTA HEAD FINDINGS Anterior circulation: Both internal carotid arteries are widely patent through the skull base and siphon regions. No siphon stenosis. The anterior and middle cerebral vessels are normal without proximal stenosis, aneurysm or vascular malformation. No large or medium vessel occlusion. Posterior circulation: Both vertebral arteries are widely patent to the basilar. No basilar stenosis. Posterior circulation branch vessels appear normal. Venous sinuses: Patent and normal. Anatomic variants: None Review of the MIP images confirms the above findings CT Brain Perfusion Findings: ASPECTS: 10 CBF (<30%) Volume: 40mL Perfusion (Tmax>6.0s) volume: 78mL Mismatch Volume: 69mL Infarction Location:None IMPRESSION: 1. No large or medium vessel occlusion. Normal perfusion study. 2. Aortic atherosclerosis. 3. Minimal calcified plaque at the left carotid bifurcation but no stenosis. Aortic Atherosclerosis (ICD10-I70.0). Electronically Signed   By: Nelson Chimes M.D.   On: 07/11/2020 12:30   CT HEAD WO CONTRAST  Result Date: 07/11/2020 CLINICAL DATA:  TIA this morning, headache, blurry vision, left upper extremity weakness now resolved. No reported injury. EXAM: CT HEAD WITHOUT CONTRAST TECHNIQUE: Contiguous axial images were obtained from the base of the skull through the  vertex without intravenous contrast. COMPARISON:  06/19/2020 head  CT FINDINGS: Brain: No evidence of parenchymal hemorrhage or extra-axial fluid collection. No mass lesion, mass effect, or midline shift. No CT evidence of acute infarction. Generalized cerebral volume loss. Nonspecific moderate subcortical and periventricular white matter hypodensity, most in keeping with chronic small vessel ischemic change. No ventriculomegaly. Vascular: No acute abnormality. Skull: No evidence of calvarial fracture. Sinuses/Orbits: The visualized paranasal sinuses are essentially clear. Other:  The mastoid air cells are unopacified. IMPRESSION: 1. No evidence of acute intracranial abnormality. 2. Generalized cerebral volume loss and moderate chronic small vessel ischemic changes in the cerebral white matter. Electronically Signed   By: Ilona Sorrel M.D.   On: 07/11/2020 09:50   CT Code Stroke CTA Neck W/WO contrast  Result Date: 07/11/2020 CLINICAL DATA:  Headache. Blurred vision. Left upper extremity weakness. EXAM: CT ANGIOGRAPHY HEAD AND NECK CT PERFUSION BRAIN TECHNIQUE: Multidetector CT imaging of the head and neck was performed using the standard protocol during bolus administration of intravenous contrast. Multiplanar CT image reconstructions and MIPs were obtained to evaluate the vascular anatomy. Carotid stenosis measurements (when applicable) are obtained utilizing NASCET criteria, using the distal internal carotid diameter as the denominator. Multiphase CT imaging of the brain was performed following IV bolus contrast injection. Subsequent parametric perfusion maps were calculated using RAPID software. CONTRAST:  145mL OMNIPAQUE IOHEXOL 350 MG/ML SOLN COMPARISON:  Head CT earlier same day. FINDINGS: CTA NECK FINDINGS Aortic arch: Aortic atherosclerosis and tortuosity. No aneurysm or dissection. Branching pattern is normal without origin stenosis. Right carotid system: Common carotid artery widely patent to the bifurcation. Carotid bifurcation is widely patent without soft or  calcified plaque. Cervical ICA widely patent. Left carotid system: Common carotid artery widely patent to the bifurcation. Minimal calcified plaque at the carotid bifurcation but no stenosis. Cervical ICA widely patent. Vertebral arteries: Both vertebral artery origins are widely patent. Both vertebral arteries appeared widely patent and normal through the cervical region to the foramen magnum. Skeleton: Distant cervical fusion. Chronic degenerative anterolisthesis at C7-T1. Old minor superior endplate deformity at T6. Other neck: No mass or lymphadenopathy. Upper chest:  Scarring at the lung apices. No active process. Review of the MIP images confirms the above findings CTA HEAD FINDINGS Anterior circulation: Both internal carotid arteries are widely patent through the skull base and siphon regions. No siphon stenosis. The anterior and middle cerebral vessels are normal without proximal stenosis, aneurysm or vascular malformation. No large or medium vessel occlusion. Posterior circulation: Both vertebral arteries are widely patent to the basilar. No basilar stenosis. Posterior circulation branch vessels appear normal. Venous sinuses: Patent and normal. Anatomic variants: None Review of the MIP images confirms the above findings CT Brain Perfusion Findings: ASPECTS: 10 CBF (<30%) Volume: 52mL Perfusion (Tmax>6.0s) volume: 84mL Mismatch Volume: 62mL Infarction Location:None IMPRESSION: 1. No large or medium vessel occlusion. Normal perfusion study. 2. Aortic atherosclerosis. 3. Minimal calcified plaque at the left carotid bifurcation but no stenosis. Aortic Atherosclerosis (ICD10-I70.0). Electronically Signed   By: Nelson Chimes M.D.   On: 07/11/2020 12:30   CT Code Stroke Cerebral Perfusion with contrast  Result Date: 07/11/2020 CLINICAL DATA:  Headache. Blurred vision. Left upper extremity weakness. EXAM: CT ANGIOGRAPHY HEAD AND NECK CT PERFUSION BRAIN TECHNIQUE: Multidetector CT imaging of the head and neck was  performed using the standard protocol during bolus administration of intravenous contrast. Multiplanar CT image reconstructions and MIPs were obtained to evaluate the vascular anatomy. Carotid stenosis measurements (when applicable) are obtained  utilizing NASCET criteria, using the distal internal carotid diameter as the denominator. Multiphase CT imaging of the brain was performed following IV bolus contrast injection. Subsequent parametric perfusion maps were calculated using RAPID software. CONTRAST:  145mL OMNIPAQUE IOHEXOL 350 MG/ML SOLN COMPARISON:  Head CT earlier same day. FINDINGS: CTA NECK FINDINGS Aortic arch: Aortic atherosclerosis and tortuosity. No aneurysm or dissection. Branching pattern is normal without origin stenosis. Right carotid system: Common carotid artery widely patent to the bifurcation. Carotid bifurcation is widely patent without soft or calcified plaque. Cervical ICA widely patent. Left carotid system: Common carotid artery widely patent to the bifurcation. Minimal calcified plaque at the carotid bifurcation but no stenosis. Cervical ICA widely patent. Vertebral arteries: Both vertebral artery origins are widely patent. Both vertebral arteries appeared widely patent and normal through the cervical region to the foramen magnum. Skeleton: Distant cervical fusion. Chronic degenerative anterolisthesis at C7-T1. Old minor superior endplate deformity at T6. Other neck: No mass or lymphadenopathy. Upper chest:  Scarring at the lung apices. No active process. Review of the MIP images confirms the above findings CTA HEAD FINDINGS Anterior circulation: Both internal carotid arteries are widely patent through the skull base and siphon regions. No siphon stenosis. The anterior and middle cerebral vessels are normal without proximal stenosis, aneurysm or vascular malformation. No large or medium vessel occlusion. Posterior circulation: Both vertebral arteries are widely patent to the basilar. No  basilar stenosis. Posterior circulation branch vessels appear normal. Venous sinuses: Patent and normal. Anatomic variants: None Review of the MIP images confirms the above findings CT Brain Perfusion Findings: ASPECTS: 10 CBF (<30%) Volume: 53mL Perfusion (Tmax>6.0s) volume: 88mL Mismatch Volume: 42mL Infarction Location:None IMPRESSION: 1. No large or medium vessel occlusion. Normal perfusion study. 2. Aortic atherosclerosis. 3. Minimal calcified plaque at the left carotid bifurcation but no stenosis. Aortic Atherosclerosis (ICD10-I70.0). Electronically Signed   By: Nelson Chimes M.D.   On: 07/11/2020 12:30    Procedures Procedures (including critical care time)  Medications Ordered in ED Medications  sodium chloride flush (NS) 0.9 % injection 3 mL (0 mLs Intravenous Hold 07/11/20 0958)  iohexol (OMNIPAQUE) 350 MG/ML injection 100 mL (100 mLs Intravenous Contrast Given 07/11/20 1221)    ED Course  I have reviewed the triage vital signs and the nursing notes.  Pertinent labs & imaging results that were available during my care of the patient were reviewed by me and considered in my medical decision making (see chart for details).    MDM Rules/Calculators/A&P                          Patient presenting for evaluation of left arm numbness and generalized weakness. On my exam, pt is very tired. She will respond appropriately, but keeping her eyes closed unless direct otherwise. She has a neuro deficit on the LUE. This is new per chart review. However, pt outside stroke window. No obvious LVO.  Labs obtained from triage overall reassuring.  No significant metabolic abnormality.  Urine pending.  CT head ordered from triage is pending.  CT head negative for acute findings.  Will consult with neurology.  Neurology evaluated the patient.  They are seeing right visual neglect.  Perfusion studies ordered.  Perfusion studies negative.  Discussed with neurology.  Recommends admission to medicine. Requests  MRI, if negative consider need for EEG.   Discussed with Dr. Tamala Julian from [facility] hospitalist service, patient to be admitted.  Final Clinical Impression(s) / ED Diagnoses Final  diagnoses:  LUE numbness  Weakness    Rx / DC Orders ED Discharge Orders    None       Franchot Heidelberg, PA-C 07/11/20 1312    Pattricia Boss, MD 07/15/20 1436

## 2020-07-11 NOTE — ED Triage Notes (Addendum)
Patient brought in by [facility] from [place] for left arm weakness that started this morning which EMS states resolved by the time they arrived. Patient states she has had a persistent headache since yesterday at 1400 that was accompanied by blurred vision, vision has returned to normal. Patient's grip strength equal in bilateral extremities.  During initial assessment of grip strength, patient appeared to have difficulty lifting her left arm and would only squeeze her right hand. With repeated instruction, patient lifted her left arm and grip was equal. No drift. When assessing sensation in arms, patient states she does not feel sensation on left upper arm but does feel hands and forearm.

## 2020-07-11 NOTE — Procedures (Signed)
Patient Name: Sara Davila  MRN: [id number]  Epilepsy Attending: Lora Havens  Referring Physician/Provider: Dr. Varney Baas Date: 07/11/2020 Duration: 24 mins  Patient history: 86 year old female who presented with left-sided weakness and left hemianopia.  EEG evaluate for seizures.  Level of alertness: Awake  AEDs during EEG study: None  Technical aspects: This EEG study was done with scalp electrodes positioned according to the 10-20 International system of electrode placement. Electrical activity was acquired at a sampling rate of 500Hz  and reviewed with a high frequency filter of 70Hz  and a low frequency filter of 1Hz . EEG data were recorded continuously and digitally stored.   Description: No posterior dominant rhythm was seen. EEG showed 8-9 z alpha activity in left hemisphere and continuous 3-5hz  theta-delta activity in right hemisphere which at times appear rhythmic and sharply contoured without definite evolution. Sharp waves were also seen in right frontal region.  Hyperventilation and photic stimulation were not performed.     ABNORMALITY - Sharp wave, right frontal region -Continuous slow, right hemisphere  IMPRESSION: This study showed evidence of epileptogenicity arising from right frontal region as well as cortical dysfunction in right hemisphere likely secondary to underlying structural abnormality, post-ictal state.   Zylon Creamer Barbra Sarks

## 2020-07-11 NOTE — Consult Note (Addendum)
Neurology Consultation  Reason for Consult: Left-sided neglect Referring Physician: Dr. Jeanell Sparrow  CC: Left-sided neglect  History is obtained from: Patient  HPI: Sara Davila is a 86 y.o. female with history of TIA, pneumonia, fibromyalgia, and diverticulosis.  Patient states that she noted some left-sided weakness back on July 23.  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  She states that she was using her walker and noted that she cannot grasp with her left hand.  This lasted for 20 minutes and then went away.  Yesterday afternoon at approximately 1400 hrs. she felt generalized weakness all over and nausea, she walked to her assisted living apartment to wear a CNA had told her she should lay down.  At that time she did not go to the hospital.  However today she noticed that she was difficult time with her left hand and focusing on the left side especially with using her walker.  She developed a headache which was an 8/10 that she came to the ED.  She also notes that yesterday at 1400 hrs. she had some difficulty with her vision when she was playing cards.  Neurology was consulted due to ER staff noting that she was not using her left side and it seemed like she was neglecting her left side. Patient is main complaint when I went into the room was the fact that she had some left arm weakness but mostly generalized weakness.  Patient was also very thirsty.   LKW: 1400 8.11.2021 tpa given?: no, out of window Premorbid modified Rankin scale (mRS): 4 NIHSS 1a Level of Conscious.: 0 1b LOC Questions: 0 1c LOC Commands: 0 2 Best Gaze: 0 3 Visual: 2 4 Facial Palsy: 0 5a Motor Arm - left: 0 5b Motor Arm - Right: 0 6a Motor Leg - Left: 0 6b Motor Leg - Right: 0 7 Limb Ataxia: 0 8 Sensory: 0 9 Best Language: 0 10 Dysarthria: 0 11 Extinct. and Inatten.: 2 TOTAL: 4   Past Medical History:  Diagnosis Date  . Abdominal pain 02/26/2017  . Anxiety   . Arthritis    Left  Knee, Wrist, Back and Hips  . Cervical vertebral fusion   . Diverticulitis   . Diverticulosis   . Fibromyalgia   . GERD (gastroesophageal reflux disease)   . Gout 02/2018   L hand  . Hypothyroidism   . Pelvis fracture ([place])   . Peritoneal free air 03/29/2012  . Pneumonia   . Pneumoperitoneum 02/26/2017  . Thyroid disorder   . TIA (transient ischemic attack)   . Toe pain 09/06/2016    Family History  Problem Relation Age of Onset  . Asthma Mother   . Pulmonary embolism Mother   . Macular degeneration Mother   . Heart disease Father   . Stroke Father   . Stroke Paternal Uncle   . Breast cancer Maternal Aunt   . Macular degeneration Sister   . Stroke Sister   . Neuropathy Neg Hx    Social History:   reports that she has quit smoking. She has never used smokeless tobacco. She reports current alcohol use. She reports that she does not use drugs.  Medications  Current Facility-Administered Medications:  .  sodium chloride flush (NS) 0.9 % injection 3 mL, 3 mL, Intravenous, Once, Pattricia Boss, MD  Current Outpatient Medications:  .  Acetaminophen (TYLENOL EXTRA STRENGTH PO), Take 500 mg by mouth in the morning and at bedtime. 2  tabs daily, Disp: , Rfl:  .  amLODipine (NORVASC) 2.5 MG tablet, Take 1 tablet (2.5 mg total) by mouth daily., Disp: 60 tablet, Rfl: 0 .  Apoaequorin (PREVAGEN PO), Take 10 mg by mouth daily. , Disp: , Rfl:  .  aspirin EC 81 MG EC tablet, Take 1 tablet (81 mg total) by mouth daily. Swallow whole., Disp: 360 tablet, Rfl: 0 .  atorvastatin (LIPITOR) 20 MG tablet, Take 1 tablet (20 mg total) by mouth daily., Disp: 90 tablet, Rfl: 0 .  Calcium Carbonate-Vitamin D (CALCIUM 500/D PO), Take 500 mg by mouth daily., Disp: , Rfl:  .  Carboxymethylcellulose Sodium (THERATEARS OP), Place 1 drop into both eyes 2 (two) times daily. , Disp: , Rfl:  .  clopidogrel (PLAVIX) 75 MG tablet, Take 1 tablet (75 mg total) by mouth daily for 21 days., Disp: 21 tablet, Rfl: 0 .   diclofenac Sodium (VOLTAREN) 1 % GEL, Apply topically in the morning and at bedtime. To knee , Disp: , Rfl:  .  ibandronate (BONIVA) 150 MG tablet, Take 150 mg by mouth every 30 (thirty) days. Take in the morning with a full glass of water, on an empty stomach, and do not take anything else by mouth or lie down for the next 30 min., Disp: , Rfl:  .  levothyroxine (SYNTHROID) 75 MCG tablet, Take 1 tablet (75 mcg total) by mouth daily before breakfast., Disp: 30 tablet, Rfl: 1 .  LORazepam (ATIVAN) 0.5 MG tablet, Take 0.5 tablets (0.25 mg total) by mouth 2 (two) times daily as needed for anxiety., Disp: 30 tablet, Rfl: 0 .  omeprazole (PRILOSEC) 20 MG capsule, Take 20 mg by mouth daily as needed. , Disp: , Rfl:  .  oxyCODONE (OXY IR/ROXICODONE) 5 MG immediate release tablet, Take 5 mg by mouth every 4 (four) hours as needed for severe pain., Disp: , Rfl:  .  polyethylene glycol (MIRALAX / GLYCOLAX) 17 g packet, Take 17 g by mouth daily as needed., Disp: , Rfl:  .  saccharomyces boulardii (FLORASTOR) 250 MG capsule, Take 250 mg by mouth daily. , Disp: , Rfl:  .  senna-docusate (SENOKOT-S) 8.6-50 MG tablet, Take 2 tablets by mouth 2 (two) times daily., Disp:  , Rfl:  .  traMADol (ULTRAM) 50 MG tablet, Take 50 mg by mouth every 6 (six) hours as needed for moderate pain. , Disp: , Rfl:  .  Vitamin D, Cholecalciferol, 50 MCG (2000 UT) CAPS, Take 1 tablet by mouth daily., Disp: , Rfl:  .  zolpidem (AMBIEN) 5 MG tablet, Take 0.5 tablets (2.5 mg total) by mouth at bedtime as needed for sleep., Disp: 15 tablet, Rfl: 0  ROS:.   General ROS: negative for - chills, fatigue, fever, night sweats, weight gain or weight loss Psychological ROS: negative for - behavioral disorder, hallucinations, memory difficulties, mood swings or suicidal ideation Ophthalmic ROS: Positive for - blurry vision ENT ROS: negative for - epistaxis, nasal discharge, oral lesions, sore throat, tinnitus or vertigo Allergy and Immunology  ROS: negative for - hives or itchy/watery eyes Hematological and Lymphatic ROS: negative for - bleeding problems, bruising or swollen lymph nodes Endocrine ROS: negative for - galactorrhea, hair pattern changes, polydipsia/polyuria or temperature intolerance Respiratory ROS: negative for - cough, hemoptysis, shortness of breath or wheezing Cardiovascular ROS: negative for - chest pain, dyspnea on exertion, edema or irregular heartbeat Gastrointestinal ROS: negative for - abdominal pain, diarrhea, hematemesis, nausea/vomiting or stool incontinence Genito-Urinary ROS: negative for - dysuria, hematuria, incontinence  or urinary frequency/urgency Musculoskeletal ROS: Positive for - j generalized muscular weakness Neurological ROS: as noted in HPI Dermatological ROS: negative for rash and skin lesion changes  Exam: Current vital signs: BP (!) 151/83   Pulse 91   Temp 98.3 F (36.8 C) (Oral)   Resp 12   SpO2 96%  Vital signs in last 24 hours: Temp:  [98.3 F (36.8 C)] 98.3 F (36.8 C) (08/12 0915) Pulse Rate:  [87-94] 91 (08/12 1143) Resp:  [12-16] 12 (08/12 1111) BP: (151-168)/(83) 151/83 (08/12 1143) SpO2:  [94 %-97 %] 96 % (08/12 1143)   Constitutional: Cachectic.  Eyes: No scleral injection HENT: No OP obstrucion Head: Normocephalic.  Cardiovascular: Normal rate and regular rhythm.  Respiratory: Effort normal, non-labored breathing GI: Soft.  No distension. There is no tenderness.  Skin: WDI  Neuro: Mental Status: Patient is awake, alert, oriented to person, place, month, year, and situation. Speech-shows no dysarthria or aphasia.  She has intact naming, repeating and comprehension.  Patient is able to give a fairly good history.  Patient is able to follow commands. Cranial Nerves: II: Left hemianopsia III,IV, VI: EOMI without ptosis or diploplia. Pupils equal, round and reactive to light V: Facial sensation is symmetric to temperature VII: Facial movement is symmetric.   VIII: hearing is intact to voice X: Palat elevates symmetrically XI: Shoulder shrug is symmetric. XII: tongue is midline without atrophy or fasciculations.  Motor: Tone is normal. Bulk is normal. 5/5 strength was present in all four extremities.  No drift Sensory: Sensation is symmetric to light touch and temperature in the arms and legs.  That said she has a dense neglect of her left side and does not recognize her own left hand. Deep Tendon Reflexes: No brachioradialis and or patellar reflex Plantars: Toes are downgoing bilaterally.  Cerebellar: FNF and HKS are intact bilaterally  Labs I have reviewed labs in epic and the results pertinent to this consultation are:   CBC    Component Value Date/Time   WBC 7.1 07/11/2020 0914   RBC 4.20 07/11/2020 0914   HGB 13.2 07/11/2020 0914   HCT 40.3 07/11/2020 0914   PLT 521 (H) 07/11/2020 0914   MCV 96.0 07/11/2020 0914   MCH 31.4 07/11/2020 0914   MCHC 32.8 07/11/2020 0914   RDW 13.0 07/11/2020 0914   LYMPHSABS 0.7 07/11/2020 0914   MONOABS 0.7 07/11/2020 0914   EOSABS 0.1 07/11/2020 0914   BASOSABS 0.1 07/11/2020 0914    CMP     Component Value Date/Time   NA 127 (L) 07/11/2020 0914   NA 130 (A) 04/17/2020 0000   K 4.2 07/11/2020 0914   CL 92 (L) 07/11/2020 0914   CO2 26 07/11/2020 0914   GLUCOSE 112 (H) 07/11/2020 0914   BUN 9 07/11/2020 0914   BUN 10 04/17/2020 0000   CREATININE 0.65 07/11/2020 0914   CREATININE 0.67 06/13/2020 0700   CALCIUM 10.2 07/11/2020 0914   PROT 6.8 07/11/2020 0914   PROT 6.7 03/30/2018 1155   ALBUMIN 3.9 07/11/2020 0914   AST 24 07/11/2020 0914   ALT 19 07/11/2020 0914   ALKPHOS 62 07/11/2020 0914   BILITOT 0.8 07/11/2020 0914   GFRNONAA >60 07/11/2020 0914   GFRNONAA 79 06/13/2020 0700   GFRAA >60 07/11/2020 0914   GFRAA 92 06/13/2020 0700    Lipid Panel     Component Value Date/Time   CHOL 171 06/20/2020 0421   TRIG 89 06/20/2020 0421   HDL 59 06/20/2020 0421  CHOLHDL  2.9 06/20/2020 0421   VLDL 18 06/20/2020 0421   LDLCALC 94 06/20/2020 0421   LDLCALC 93 06/13/2020 0700     Imaging I have reviewed the images obtained:  CT-scan of the brain-no evidence of acute intracranial abnormality.  CTA of head and neck along with perfusion-no large vessel and or medium vessel occlusion.  Normal perfusion study.  Aortic atherosclerosis.  Minimal calcification plaque at the left carotid bifurcation no stenosis  Etta Quill PA-C [facility] [phone]  M-F  (9:00 am- 5:00 PM)  07/11/2020, 12:52 PM     Assessment:  Patient has no significant stroke risk factors and her past medical history.  She developed left-sided weakness at approximately 1400 hrs. yesterday.  On exam patient showed a dense left-sided neglect along with left hemianopsia.  Imaging did not show any large or medium vessel occlusion and perfusion did not show infarction.  At this point would like to get MRI brain to fully rule out possible stroke that was not observed by perfusion.  In addition would like to get EEG as other etiology could be intermittent seizures which is leaving her with left-sided neglect.   Impression: -Left-sided neglect Recommendations: -MRI brain without contrast -EEG  I personally examined the patient, gathered history, reviewed labs, personally reviewed imaging, and supervised the APP above in the care of this patient.  In brief, this is a patient with some confusion coming in with some left-sided weakness and sensory symptoms that were intermittent in nature.  Laboratory evaluation is notable for hyponatremia which is to some degree chronic but appears to have an acute on chronic element.  Additionally her head CT is without acute intracranial pathology, and CTA/CTP did not reveal large vessel occlusion or any tissue at risk of ischemic damage.  EEG reflects epileptiform activity.  Notably benzodiazepine withdrawal can also precipitate seizure and it will  be useful to determine whether she may have missed doses of Ativan for a couple of days.  Nevertheless, this would be expected to be a generalized seizure and she had focal findings on EEG as well as on examination.  Therefore she merits starting antiseizure medication.  Given her age, I will start a relatively low-dose of Keppra.  Additional recommendations -Keppra 750 mg twice daily, ordered  Lesleigh Noe MD-PhD [facility] [phone]

## 2020-07-11 NOTE — Code Documentation (Signed)
Stroke Response Nurse Documentation Code Documentation  CORLIS ANGELICA is a 86 y.o. female arriving to [place] ED via [facility] on 07/11/2020 at 0902. Code stroke was activated by D. Tamala Julian, [place] following neurology consult. Patient from [facility] where she was LKW at 1400 on 06/30/2020 and initially came to [facility] ED with symptoms of persistent headache. On No antithrombotic. Stroke team at the bedside at 1208. Patient to CT with team. NIHSS 4, see documentation for details and code stroke times. Patient with left hemianopia and left neglect on exam. The following imaging was completed: CTA head and neck, CTP. Patient is not a candidate for tPA due to LKW at 1400 yesterday.  Care/Plan: q2h VS and neurological checks Bedside handoff with ED RN Mikayla.    Sunaina Ferrando L Salim Forero  Rapid Response RN

## 2020-07-11 NOTE — ED Notes (Signed)
Pt provided snack bag and soda.

## 2020-07-11 NOTE — H&P (Signed)
History and Physical    Sara Davila GHW:[id number] DOB: [birth year] DOA: 07/11/2020  Referring MD/NP/PA: Franchot Heidelberg, PA-C PCP: [name], MD  Patient coming from: [facility] via EMS  Chief Complaint: Left arm weakness  I have personally briefly reviewed patient's old medical records in [place]   HPI: Sara Davila is a 86 y.o. female with medical history significant of TIA, hypothyroidism, GERD, neuropathy, anxiety, and insomnia who presented with complaints of complaints of left-sided weakness.  Symptoms first started yesterday afternoon at approximately 2 PM.  She was playing marbles and cards with friends when she reported having difficulty grasping the marbles and was having some difficulty seeing the numbers on the cards.  Noted associated symptoms of a severe frontal headache that has persisted, photophobia, some mild nausea, and chronically deals with insomnia.  At that time she did not get upset and reports that the weakness and visual changes resolved within 20 minutes.  However, they returned this morning for which she came to the emergency department for further evaluation.  She admits that this is the third time that something similar has happened.  ED Course: Upon admission to the emergency department patient was seen to be afebrile with blood pressures 151/83-160 8/83, and all other vital signs maintained. Neurology had been consulted, but she was not a TPA candidate as she was out of the window. Labs significant for platelets 521 and sodium 127. UDS was negative.  Urinalysis showed no signs of infection.     Review of Systems  Constitutional: Positive for malaise/fatigue. Negative for fever.  HENT: Negative for congestion and ear discharge.   Eyes: Positive for blurred vision and photophobia. Negative for pain.  Respiratory: Negative for cough and shortness of breath.   Cardiovascular: Negative for chest pain and leg swelling.  Gastrointestinal:  Positive for nausea. Negative for abdominal pain and vomiting.  Genitourinary: Negative for dysuria and hematuria.  Musculoskeletal: Negative for falls and joint pain.  Skin: Negative for itching and rash.  Neurological: Positive for focal weakness. Negative for loss of consciousness.  Endo/Heme/Allergies: Positive for polydipsia.  Psychiatric/Behavioral: The patient has insomnia.     Past Medical History:  Diagnosis Date  . Abdominal pain 02/26/2017  . Anxiety   . Arthritis    Left Knee, Wrist, Back and Hips  . Cervical vertebral fusion   . Diverticulitis   . Diverticulosis   . Fibromyalgia   . GERD (gastroesophageal reflux disease)   . Gout 02/2018   L hand  . Hypothyroidism   . Pelvis fracture ([place])   . Peritoneal free air 03/29/2012  . Pneumonia   . Pneumoperitoneum 02/26/2017  . Thyroid disorder   . TIA (transient ischemic attack)   . Toe pain 09/06/2016    Past Surgical History:  Procedure Laterality Date  . ABDOMINAL HYSTERECTOMY    . BOWEL RESECTION N/A 07/09/2017   Procedure: SMALL BOWEL RESECTION;  Surgeon: Johnathan Hausen, MD;  Location: [facility];  Service: General;  Laterality: N/A;  . CERVICAL FUSION     x2  . FEMUR IM NAIL Left 03/03/2020   Procedure: INTRAMEDULLARY (IM) NAIL FEMORAL;  Surgeon: Paralee Cancel, MD;  Location: [facility];  Service: Orthopedics;  Laterality: Left;  . LAPAROSCOPIC APPENDECTOMY N/A 03/04/2017   Procedure: APPENDECTOMY LAPAROSCOPIC;  Surgeon: Johnathan Hausen, MD;  Location: [facility];  Service: General;  Laterality: N/A;  . LAPAROSCOPY N/A 03/04/2017   Procedure: LAPAROSCOPY DIAGNOSTIC, ENTEROLYSIS;  Surgeon: Johnathan Hausen, MD;  Location: [facility];  Service: General;  Laterality: N/A;  . LAPAROTOMY N/A 07/09/2017   Procedure: EXPLORATORY LAPAROTOMY;  Surgeon: Johnathan Hausen, MD;  Location: [facility];  Service: General;  Laterality: N/A;  . SPINE SURGERY     Lumbar- rod placement  . TONSILLECTOMY     86y/o  . TOTAL VAGINAL HYSTERECTOMY        reports that she has quit smoking. She has never used smokeless tobacco. She reports current alcohol use. She reports that she does not use drugs.  Allergies  Allergen Reactions  . Codeine Rash  . Penicillins Rash    Has patient had a PCN reaction causing immediate rash, facial/tongue/throat swelling, SOB or lightheadedness with hypotension: No Has patient had a PCN reaction causing severe rash involving mucus membranes or skin necrosis: No Has patient had a PCN reaction that required hospitalization: No Has patient had a PCN reaction occurring within the last 10 years: No If all of the above answers are "NO", then may proceed with Cephalosporin use.   [place] [Levofloxacin]     Family History  Problem Relation Age of Onset  . Asthma Mother   . Pulmonary embolism Mother   . Macular degeneration Mother   . Heart disease Father   . Stroke Father   . Stroke Paternal Uncle   . Breast cancer Maternal Aunt   . Macular degeneration Sister   . Stroke Sister   . Neuropathy Neg Hx     Prior to Admission medications   Medication Sig Start Date End Date Taking? Authorizing Provider  Acetaminophen (TYLENOL EXTRA STRENGTH PO) Take 500 mg by mouth in the morning and at bedtime. 2 tabs daily    [provider]  amLODipine (NORVASC) 2.5 MG tablet Take 1 tablet (2.5 mg total) by mouth daily. 06/20/20 08/19/20  Kayleen Memos, DO  Apoaequorin (PREVAGEN PO) Take 10 mg by mouth daily.     [provider]  aspirin EC 81 MG EC tablet Take 1 tablet (81 mg total) by mouth daily. Swallow whole. 06/21/20 06/16/21  Kayleen Memos, DO  atorvastatin (LIPITOR) 20 MG tablet Take 1 tablet (20 mg total) by mouth daily. 06/21/20 09/19/20  Kayleen Memos, DO  Calcium Carbonate-Vitamin D (CALCIUM 500/D PO) Take 500 mg by mouth daily.    [provider]  Carboxymethylcellulose Sodium (THERATEARS OP) Place 1 drop into both eyes 2 (two) times daily.     [provider]  clopidogrel  (PLAVIX) 75 MG tablet Take 1 tablet (75 mg total) by mouth daily for 21 days. 06/21/20 07/12/20  Kayleen Memos, DO  diclofenac Sodium (VOLTAREN) 1 % GEL Apply topically in the morning and at bedtime. To knee     [provider]  ibandronate (BONIVA) 150 MG tablet Take 150 mg by mouth every 30 (thirty) days. Take in the morning with a full glass of water, on an empty stomach, and do not take anything else by mouth or lie down for the next 30 min.    [provider]  levothyroxine (SYNTHROID) 75 MCG tablet Take 1 tablet (75 mcg total) by mouth daily before breakfast. 06/15/20   [name], MD  LORazepam (ATIVAN) 0.5 MG tablet Take 0.5 tablets (0.25 mg total) by mouth 2 (two) times daily as needed for anxiety. 03/12/20   [name], MD  omeprazole (PRILOSEC) 20 MG capsule Take 20 mg by mouth daily as needed.     [provider]  oxyCODONE (OXY IR/ROXICODONE) 5 MG immediate  release tablet Take 5 mg by mouth every 4 (four) hours as needed for severe pain.    [provider]  polyethylene glycol (MIRALAX / GLYCOLAX) 17 g packet Take 17 g by mouth daily as needed.    [provider]  saccharomyces boulardii (FLORASTOR) 250 MG capsule Take 250 mg by mouth daily.     [provider]  senna-docusate (SENOKOT-S) 8.6-50 MG tablet Take 2 tablets by mouth 2 (two) times daily. 03/05/20   Shelly Coss, MD  traMADol (ULTRAM) 50 MG tablet Take 50 mg by mouth every 6 (six) hours as needed for moderate pain.     [provider]  Vitamin D, Cholecalciferol, 50 MCG (2000 UT) CAPS Take 1 tablet by mouth daily.    [provider]  zolpidem (AMBIEN) 5 MG tablet Take 0.5 tablets (2.5 mg total) by mouth at bedtime as needed for sleep. 06/24/20   Mast, Man X, NP    Physical Exam:  Constitutional: Elderly female who appears to be in no acute distress at this time. Vitals:   07/11/20 1140 07/11/20 1141 07/11/20 1143 07/11/20 1200  BP:   (!) 151/83  (!) 162/94  Pulse: 89 87 91 91  Resp:    13  Temp:      TempSrc:      SpO2: 96% 96% 96%    Eyes: PERRL, lids and conjunctivae normal ENMT: Mucous membranes are moist. Posterior pharynx clear of any exudate or lesions.Normal dentition.  Neck: normal, supple, no masses, no thyromegaly Respiratory: clear to auscultation bilaterally, no wheezing, no crackles. Normal respiratory effort. No accessory muscle use.  Cardiovascular: Regular rate and rhythm, no murmurs / rubs / gallops. No extremity edema. 2+ pedal pulses. No carotid bruits.  Abdomen: no tenderness, no masses palpated. No hepatosplenomegaly. Bowel sounds positive.  Musculoskeletal: no clubbing / cyanosis. No joint deformity upper and lower extremities. Good ROM, no contractures. Normal muscle tone.  Skin: no rashes, lesions, ulcers. No induration Neurologic: CN 2-12 grossly intact. Sensation intact, DTR normal. Strength 5/5 in all 4.  Psychiatric: Normal judgment and insight. Alert and oriented x 3. Normal mood.     Labs on Admission: I have personally reviewed following labs and imaging studies  CBC: Recent Labs  Lab 07/11/20 0914  WBC 7.1  NEUTROABS 5.5  HGB 13.2  HCT 40.3  MCV 96.0  PLT 518*   Basic Metabolic Panel: Recent Labs  Lab 07/11/20 0914  NA 127*  K 4.2  CL 92*  CO2 26  GLUCOSE 112*  BUN 9  CREATININE 0.65  CALCIUM 10.2   GFR: CrCl cannot be calculated (Unknown ideal weight.). Liver Function Tests: Recent Labs  Lab 07/11/20 0914  AST 24  ALT 19  ALKPHOS 62  BILITOT 0.8  PROT 6.8  ALBUMIN 3.9   No results for input(s): LIPASE, AMYLASE in the last 168 hours. No results for input(s): AMMONIA in the last 168 hours. Coagulation Profile: Recent Labs  Lab 07/11/20 0914  INR 1.0   Cardiac Enzymes: No results for input(s): CKTOTAL, CKMB, CKMBINDEX, TROPONINI in the last 168 hours. BNP (last 3 results) No results for input(s): PROBNP in the last 8760 hours. HbA1C: No results for  input(s): HGBA1C in the last 72 hours. CBG: Recent Labs  Lab 07/11/20 0904  GLUCAP 101*   Lipid Profile: No results for input(s): CHOL, HDL, LDLCALC, TRIG, CHOLHDL, LDLDIRECT in the last 72 hours. Thyroid Function Tests: No results for input(s): TSH, T4TOTAL, FREET4, T3FREE, THYROIDAB in the last  72 hours. Anemia Panel: No results for input(s): VITAMINB12, FOLATE, FERRITIN, TIBC, IRON, RETICCTPCT in the last 72 hours. Urine analysis:    Component Value Date/Time   COLORURINE STRAW (A) 07/11/2020 1125   APPEARANCEUR CLEAR 07/11/2020 1125   LABSPEC 1.005 07/11/2020 1125   PHURINE 8.0 07/11/2020 1125   GLUCOSEU NEGATIVE 07/11/2020 1125   HGBUR NEGATIVE 07/11/2020 1125   BILIRUBINUR NEGATIVE 07/11/2020 1125   KETONESUR NEGATIVE 07/11/2020 1125   PROTEINUR NEGATIVE 07/11/2020 1125   NITRITE NEGATIVE 07/11/2020 1125   LEUKOCYTESUR NEGATIVE 07/11/2020 1125   Sepsis Labs: No results found for this or any previous visit (from the past 240 hour(s)).   Radiological Exams on Admission: CT Code Stroke CTA Head W/WO contrast  Result Date: 07/11/2020 CLINICAL DATA:  Headache. Blurred vision. Left upper extremity weakness. EXAM: CT ANGIOGRAPHY HEAD AND NECK CT PERFUSION BRAIN TECHNIQUE: Multidetector CT imaging of the head and neck was performed using the standard protocol during bolus administration of intravenous contrast. Multiplanar CT image reconstructions and MIPs were obtained to evaluate the vascular anatomy. Carotid stenosis measurements (when applicable) are obtained utilizing NASCET criteria, using the distal internal carotid diameter as the denominator. Multiphase CT imaging of the brain was performed following IV bolus contrast injection. Subsequent parametric perfusion maps were calculated using RAPID software. CONTRAST:  155mL OMNIPAQUE IOHEXOL 350 MG/ML SOLN COMPARISON:  Head CT earlier same day. FINDINGS: CTA NECK FINDINGS Aortic arch: Aortic atherosclerosis and tortuosity. No  aneurysm or dissection. Branching pattern is normal without origin stenosis. Right carotid system: Common carotid artery widely patent to the bifurcation. Carotid bifurcation is widely patent without soft or calcified plaque. Cervical ICA widely patent. Left carotid system: Common carotid artery widely patent to the bifurcation. Minimal calcified plaque at the carotid bifurcation but no stenosis. Cervical ICA widely patent. Vertebral arteries: Both vertebral artery origins are widely patent. Both vertebral arteries appeared widely patent and normal through the cervical region to the foramen magnum. Skeleton: Distant cervical fusion. Chronic degenerative anterolisthesis at C7-T1. Old minor superior endplate deformity at T6. Other neck: No mass or lymphadenopathy. Upper chest:  Scarring at the lung apices. No active process. Review of the MIP images confirms the above findings CTA HEAD FINDINGS Anterior circulation: Both internal carotid arteries are widely patent through the skull base and siphon regions. No siphon stenosis. The anterior and middle cerebral vessels are normal without proximal stenosis, aneurysm or vascular malformation. No large or medium vessel occlusion. Posterior circulation: Both vertebral arteries are widely patent to the basilar. No basilar stenosis. Posterior circulation branch vessels appear normal. Venous sinuses: Patent and normal. Anatomic variants: None Review of the MIP images confirms the above findings CT Brain Perfusion Findings: ASPECTS: 10 CBF (<30%) Volume: 49mL Perfusion (Tmax>6.0s) volume: 7mL Mismatch Volume: 54mL Infarction Location:None IMPRESSION: 1. No large or medium vessel occlusion. Normal perfusion study. 2. Aortic atherosclerosis. 3. Minimal calcified plaque at the left carotid bifurcation but no stenosis. Aortic Atherosclerosis (ICD10-I70.0). Electronically Signed   By: Nelson Chimes M.D.   On: 07/11/2020 12:30   CT HEAD WO CONTRAST  Result Date: 07/11/2020 CLINICAL  DATA:  TIA this morning, headache, blurry vision, left upper extremity weakness now resolved. No reported injury. EXAM: CT HEAD WITHOUT CONTRAST TECHNIQUE: Contiguous axial images were obtained from the base of the skull through the vertex without intravenous contrast. COMPARISON:  06/19/2020 head CT FINDINGS: Brain: No evidence of parenchymal hemorrhage or extra-axial fluid collection. No mass lesion, mass effect, or midline shift. No CT evidence of acute  infarction. Generalized cerebral volume loss. Nonspecific moderate subcortical and periventricular white matter hypodensity, most in keeping with chronic small vessel ischemic change. No ventriculomegaly. Vascular: No acute abnormality. Skull: No evidence of calvarial fracture. Sinuses/Orbits: The visualized paranasal sinuses are essentially clear. Other:  The mastoid air cells are unopacified. IMPRESSION: 1. No evidence of acute intracranial abnormality. 2. Generalized cerebral volume loss and moderate chronic small vessel ischemic changes in the cerebral white matter. Electronically Signed   By: Ilona Sorrel M.D.   On: 07/11/2020 09:50   CT Code Stroke CTA Neck W/WO contrast  Result Date: 07/11/2020 CLINICAL DATA:  Headache. Blurred vision. Left upper extremity weakness. EXAM: CT ANGIOGRAPHY HEAD AND NECK CT PERFUSION BRAIN TECHNIQUE: Multidetector CT imaging of the head and neck was performed using the standard protocol during bolus administration of intravenous contrast. Multiplanar CT image reconstructions and MIPs were obtained to evaluate the vascular anatomy. Carotid stenosis measurements (when applicable) are obtained utilizing NASCET criteria, using the distal internal carotid diameter as the denominator. Multiphase CT imaging of the brain was performed following IV bolus contrast injection. Subsequent parametric perfusion maps were calculated using RAPID software. CONTRAST:  159mL OMNIPAQUE IOHEXOL 350 MG/ML SOLN COMPARISON:  Head CT earlier same  day. FINDINGS: CTA NECK FINDINGS Aortic arch: Aortic atherosclerosis and tortuosity. No aneurysm or dissection. Branching pattern is normal without origin stenosis. Right carotid system: Common carotid artery widely patent to the bifurcation. Carotid bifurcation is widely patent without soft or calcified plaque. Cervical ICA widely patent. Left carotid system: Common carotid artery widely patent to the bifurcation. Minimal calcified plaque at the carotid bifurcation but no stenosis. Cervical ICA widely patent. Vertebral arteries: Both vertebral artery origins are widely patent. Both vertebral arteries appeared widely patent and normal through the cervical region to the foramen magnum. Skeleton: Distant cervical fusion. Chronic degenerative anterolisthesis at C7-T1. Old minor superior endplate deformity at T6. Other neck: No mass or lymphadenopathy. Upper chest:  Scarring at the lung apices. No active process. Review of the MIP images confirms the above findings CTA HEAD FINDINGS Anterior circulation: Both internal carotid arteries are widely patent through the skull base and siphon regions. No siphon stenosis. The anterior and middle cerebral vessels are normal without proximal stenosis, aneurysm or vascular malformation. No large or medium vessel occlusion. Posterior circulation: Both vertebral arteries are widely patent to the basilar. No basilar stenosis. Posterior circulation branch vessels appear normal. Venous sinuses: Patent and normal. Anatomic variants: None Review of the MIP images confirms the above findings CT Brain Perfusion Findings: ASPECTS: 10 CBF (<30%) Volume: 69mL Perfusion (Tmax>6.0s) volume: 73mL Mismatch Volume: 78mL Infarction Location:None IMPRESSION: 1. No large or medium vessel occlusion. Normal perfusion study. 2. Aortic atherosclerosis. 3. Minimal calcified plaque at the left carotid bifurcation but no stenosis. Aortic Atherosclerosis (ICD10-I70.0). Electronically Signed   By: Nelson Chimes  M.D.   On: 07/11/2020 12:30   CT Code Stroke Cerebral Perfusion with contrast  Result Date: 07/11/2020 CLINICAL DATA:  Headache. Blurred vision. Left upper extremity weakness. EXAM: CT ANGIOGRAPHY HEAD AND NECK CT PERFUSION BRAIN TECHNIQUE: Multidetector CT imaging of the head and neck was performed using the standard protocol during bolus administration of intravenous contrast. Multiplanar CT image reconstructions and MIPs were obtained to evaluate the vascular anatomy. Carotid stenosis measurements (when applicable) are obtained utilizing NASCET criteria, using the distal internal carotid diameter as the denominator. Multiphase CT imaging of the brain was performed following IV bolus contrast injection. Subsequent parametric perfusion maps were calculated using RAPID software.  CONTRAST:  15mL OMNIPAQUE IOHEXOL 350 MG/ML SOLN COMPARISON:  Head CT earlier same day. FINDINGS: CTA NECK FINDINGS Aortic arch: Aortic atherosclerosis and tortuosity. No aneurysm or dissection. Branching pattern is normal without origin stenosis. Right carotid system: Common carotid artery widely patent to the bifurcation. Carotid bifurcation is widely patent without soft or calcified plaque. Cervical ICA widely patent. Left carotid system: Common carotid artery widely patent to the bifurcation. Minimal calcified plaque at the carotid bifurcation but no stenosis. Cervical ICA widely patent. Vertebral arteries: Both vertebral artery origins are widely patent. Both vertebral arteries appeared widely patent and normal through the cervical region to the foramen magnum. Skeleton: Distant cervical fusion. Chronic degenerative anterolisthesis at C7-T1. Old minor superior endplate deformity at T6. Other neck: No mass or lymphadenopathy. Upper chest:  Scarring at the lung apices. No active process. Review of the MIP images confirms the above findings CTA HEAD FINDINGS Anterior circulation: Both internal carotid arteries are widely patent  through the skull base and siphon regions. No siphon stenosis. The anterior and middle cerebral vessels are normal without proximal stenosis, aneurysm or vascular malformation. No large or medium vessel occlusion. Posterior circulation: Both vertebral arteries are widely patent to the basilar. No basilar stenosis. Posterior circulation branch vessels appear normal. Venous sinuses: Patent and normal. Anatomic variants: None Review of the MIP images confirms the above findings CT Brain Perfusion Findings: ASPECTS: 10 CBF (<30%) Volume: 43mL Perfusion (Tmax>6.0s) volume: 3mL Mismatch Volume: 42mL Infarction Location:None IMPRESSION: 1. No large or medium vessel occlusion. Normal perfusion study. 2. Aortic atherosclerosis. 3. Minimal calcified plaque at the left carotid bifurcation but no stenosis. Aortic Atherosclerosis (ICD10-I70.0). Electronically Signed   By: Nelson Chimes M.D.   On: 07/11/2020 12:30    EKG: Independently reviewed.Sinus rhythm at 88 bpm with premature complexes.  Assessment/Plan Left-sided weakness and visual disturbance/history of TIA: Patient presents with complaints of left-sided and reports of difficulty seeing numbers while playing marbles and cards yesterday afternoon around 2 PM.  Symptoms lasted approximately 20 minutes and resolved.  However patient reported recurrence of symptoms this morning.  Review of records shows that patient just recently hospitalized in July for similar.  She does complain of having a severe headache.   -Admit to telemetry bed -Stroke order set initiated -Neuro checks -Follow-up MRI brain -Follow-up EEG -PT/OT/Speech to eval and treat -Continue aspirin and Plavix -Appreciate neurology consultative services, will follow-up   Hyponatremia: Acute on chronic.  Patient sodium noted to be 127 on admission.  She is no longer on hydrochlorothiazide as this was stopped during her last hospitalization. -Gentle IV fluids at 75 mL/h x 1 litter -Recheck sodium  levels in a.m.  Essential hypertension: Allowing for permissive hypertension at this time.  Home blood pressure medications include amlodipine 2.5 mg daily. -Restart amlodipine if no signs of stroke in a.m.  Hypothyroidism: Last TSH within normal limits at 2.389 on 7/21. -Continue home regimen of levothyroxine   Anxiety: Home medications include Ativan 0.25 mg twice daily as needed for anxiety. -Continue Ativan as needed  Thrombocytosis: Acute on chronic.  Platelet count elevated at 521 on admission. -Continue to monitor  Slow transit constipation -Continue Senokot S and MiraLAX as needed  Insomnia: Chronic. -Continue Ambien at night  Hyperlipidemia: Home medications include atorvastatin 20 mg daily. -Continue atorvastatin  DNR: Present on admission  Of note at the nursing facility it appeared that she was no longer on any of the medications per the report that was sent.  However patient reported  that she was still taking everything.  DVT prophylaxis: Lovenox  Code Status: DNR Family Communication: Left message for daughter  Disposition Plan: Likely discharge back to nursing facility once medically stable Consults called: Neurology Admission status: Observation  Norval Morton MD [facility] Pager [phone]   If 7PM-7AM, please contact night-coverage [email or web address] Password [facility]  07/11/2020, 1:04 PM

## 2020-07-11 NOTE — Progress Notes (Signed)
EEG complete - results pending 

## 2020-07-12 DIAGNOSIS — R531 Weakness: Secondary | ICD-10-CM | POA: Diagnosis present

## 2020-07-12 DIAGNOSIS — M109 Gout, unspecified: Secondary | ICD-10-CM | POA: Diagnosis present

## 2020-07-12 DIAGNOSIS — F419 Anxiety disorder, unspecified: Secondary | ICD-10-CM | POA: Diagnosis present

## 2020-07-12 DIAGNOSIS — Z825 Family history of asthma and other chronic lower respiratory diseases: Secondary | ICD-10-CM | POA: Diagnosis not present

## 2020-07-12 DIAGNOSIS — G4700 Insomnia, unspecified: Secondary | ICD-10-CM | POA: Diagnosis present

## 2020-07-12 DIAGNOSIS — E032 Hypothyroidism due to medicaments and other exogenous substances: Secondary | ICD-10-CM | POA: Diagnosis not present

## 2020-07-12 DIAGNOSIS — Z66 Do not resuscitate: Secondary | ICD-10-CM | POA: Diagnosis present

## 2020-07-12 DIAGNOSIS — E039 Hypothyroidism, unspecified: Secondary | ICD-10-CM | POA: Diagnosis present

## 2020-07-12 DIAGNOSIS — I10 Essential (primary) hypertension: Secondary | ICD-10-CM | POA: Diagnosis present

## 2020-07-12 DIAGNOSIS — M797 Fibromyalgia: Secondary | ICD-10-CM | POA: Diagnosis present

## 2020-07-12 DIAGNOSIS — E876 Hypokalemia: Secondary | ICD-10-CM | POA: Diagnosis not present

## 2020-07-12 DIAGNOSIS — R569 Unspecified convulsions: Secondary | ICD-10-CM | POA: Diagnosis present

## 2020-07-12 DIAGNOSIS — Z981 Arthrodesis status: Secondary | ICD-10-CM | POA: Diagnosis not present

## 2020-07-12 DIAGNOSIS — E871 Hypo-osmolality and hyponatremia: Secondary | ICD-10-CM | POA: Diagnosis not present

## 2020-07-12 DIAGNOSIS — K219 Gastro-esophageal reflux disease without esophagitis: Secondary | ICD-10-CM | POA: Diagnosis present

## 2020-07-12 DIAGNOSIS — Z881 Allergy status to other antibiotic agents status: Secondary | ICD-10-CM | POA: Diagnosis not present

## 2020-07-12 DIAGNOSIS — E861 Hypovolemia: Secondary | ICD-10-CM | POA: Diagnosis present

## 2020-07-12 DIAGNOSIS — K5901 Slow transit constipation: Secondary | ICD-10-CM | POA: Diagnosis present

## 2020-07-12 DIAGNOSIS — Z823 Family history of stroke: Secondary | ICD-10-CM | POA: Diagnosis not present

## 2020-07-12 DIAGNOSIS — Z8249 Family history of ischemic heart disease and other diseases of the circulatory system: Secondary | ICD-10-CM | POA: Diagnosis not present

## 2020-07-12 DIAGNOSIS — Z8673 Personal history of transient ischemic attack (TIA), and cerebral infarction without residual deficits: Secondary | ICD-10-CM | POA: Diagnosis not present

## 2020-07-12 DIAGNOSIS — E785 Hyperlipidemia, unspecified: Secondary | ICD-10-CM | POA: Diagnosis present

## 2020-07-12 DIAGNOSIS — Z885 Allergy status to narcotic agent status: Secondary | ICD-10-CM | POA: Diagnosis not present

## 2020-07-12 DIAGNOSIS — Z880 Allergy status to penicillin: Secondary | ICD-10-CM | POA: Diagnosis not present

## 2020-07-12 DIAGNOSIS — Z20822 Contact with and (suspected) exposure to covid-19: Secondary | ICD-10-CM | POA: Diagnosis present

## 2020-07-12 DIAGNOSIS — Z7983 Long term (current) use of bisphosphonates: Secondary | ICD-10-CM | POA: Diagnosis not present

## 2020-07-12 LAB — URINE CULTURE

## 2020-07-12 LAB — BASIC METABOLIC PANEL
Anion gap: 10 (ref 5–15)
BUN: 11 mg/dL (ref 8–23)
CO2: 24 mmol/L (ref 22–32)
Calcium: 9.4 mg/dL (ref 8.9–10.3)
Chloride: 97 mmol/L — ABNORMAL LOW (ref 98–111)
Creatinine, Ser: 0.67 mg/dL (ref 0.44–1.00)
GFR calc Af Amer: >60 mL/min (ref >60)
GFR calc non Af Amer: >60 mL/min (ref >60)
Glucose, Bld: 99 mg/dL (ref 70–99)
Potassium: 3.6 mmol/L (ref 3.5–5.1)
Sodium: 131 mmol/L — ABNORMAL LOW (ref 135–145)

## 2020-07-12 MED ORDER — DEXTROSE-NACL 5-0.9 % IV SOLN: INTRAVENOUS | Status: DC

## 2020-07-12 NOTE — Progress Notes (Addendum)
PROGRESS NOTE    Sara Davila  XAJ:[id number] DOB: 06/03/1930 DOA: 07/11/2020 PCP: [name], MD    Brief Narrative:  Patient admitted to the hospital with working diagnosis of focal neurologic deficit due new onset seizures in the setting of old CVA.   86 year old female with significant past medical history for TIA, hypothyroidism, GERD, neuropathy, anxiety and insomnia.  She reported intermittent left-sided weakness, for about 24 hours in duration, associated with apraxia, headaches, photophobia and nausea.  Her initial symptoms resolved after 20 minutes but recur about 12 hours later, prompting her to come to the hospital.  On her initial physical examination blood pressure 151/83, heart rate 91, respiratory rate 13, oxygen saturation 96%.  She had clear lungs to auscultation, heart S1-S2, present rhythmic, soft abdomen, no lower extremity edema.  Neurologically patient was nonfocal. Sodium 127, potassium 4.2, chloride 92, bicarb 26, glucose 112, BUN 9, creatinine 0.65, white count 7.1, hemoglobin 13.0, hematocrit 40.2, platelets 521.  SARS COVID-19 negative.  Urinalysis specific gravity 1.005.  Negative leukocytes.  Head and Neck CT no acute changes.  Head CT angiography with no large or medium vessel occlusion.  EKG 88 bpm, normal axis, normal intervals, sinus rhythm, PAC, poor R wave progression, no ST segment or T wave changes, positive LVH.  Electroencephalography showed evidence of epileptogenic city arising from the right frontal region, as well as cortical dysfunction in the right hemisphere. Brain MRI with chronic microvascular changes.   Assessment & Plan:   Principal Problem:   Seizure ([address]) Active Problems:   Hypothyroidism   Hyponatremia   Slow transit constipation   HTN (hypertension), benign   Thrombocytosis (HCC)   History of TIA (transient ischemic attack)   1. 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 Patient this am has been awake and alert, at the time  of my examination she is very somnolent and hyporeactive, opening her eyes to touch and voice.   Patient has been placed on Keppra for seizure control. Continue neuro checks, speech therapy, PT and OT recommendations.  Continue close neurologic monitoring.   2. History of CVA. Continue dual antiplatelet therapy with asa and clopidogrel. Continue statin therapy with atorvastatin 20 mg daily.   3. Hyponatremia. Patient clinically hypovolemic, follow up Na today is 131, K at 3,6 and serum bicarbonate at 24. Preserved renal function per serum cr. Her oral intake continue to be very poor.   Will add dextrose and isotonic saline at 75 ml per H. Follow up on renal function in am.   4, HTN. Will continue to hold on antihypertensive medications for now. Blood pressure 121/67 mmHg.   6. Hypothyroid. Continue with levothyroxine.   Patient continue to be at high risk for worsening seizures.   Status is: Observation  The patient will require care spanning > 2 midnights and should be moved to inpatient because: IV treatments appropriate due to intensity of illness or inability to take PO  Dispo: The patient is from: ALF              Anticipated d/c is to: SNF              Anticipated d/c date is: 3 days              Patient currently is not medically stable to d/c.   DVT prophylaxis: Enoxaparin   Code Status:   DNR   Family Communication:  I spoke with patient's  Son  at the bedside, we talked in  detail about patient's condition, plan of care and prognosis and all questions were addressed.       Consultants:   Neurology     Subjective: Patient very somnolent at the time of my examination, her son is at the bedside and he was able to give information. Patient very weak and deconditioned, she worked with pt this am. Very poor oral intake.   Objective: Vitals:   07/11/20 2200 07/12/20 0411 07/12/20 0750 07/12/20 1113  BP: 136/83 (!) 105/57 115/76 121/67  Pulse: 90 80 83 81  Resp: 19  18 18 17   Temp: 97.9 F (36.6 C) 98 F (36.7 C) 98.2 F (36.8 C) 98 F (36.7 C)  TempSrc: Oral Axillary Oral Oral  SpO2:  95% 95% 96%    Intake/Output Summary (Last 24 hours) at 07/12/2020 1340 Last data filed at 07/12/2020 0500 Gross per 24 hour  Intake 480 ml  Output 300 ml  Net 180 ml   There were no vitals filed for this visit.  Examination:   General: deconditioned and ill looking appearing  Neurology: somnolent and hyporeactive, opens her eyes to voice and touch but not following commands or answering to simple questions.  E ENT: positive  pallor, no icterus, oral mucosa moist Cardiovascular: No JVD. S1-S2 present, rhythmic, no gallops, rubs, or murmurs. No lower extremity edema. Pulmonary: positive breath sounds bilaterally, Gastrointestinal. Abdomen soft and non tender Skin. No rashes Musculoskeletal: no joint deformities     Data Reviewed: I have personally reviewed following labs and imaging studies  CBC: Recent Labs  Lab 07/11/20 0914  WBC 7.1  NEUTROABS 5.5  HGB 13.2  HCT 40.3  MCV 96.0  PLT 932*   Basic Metabolic Panel: Recent Labs  Lab 07/11/20 0914 07/12/20 0729  NA 127* 131*  K 4.2 3.6  CL 92* 97*  CO2 26 24  GLUCOSE 112* 99  BUN 9 11  CREATININE 0.65 0.67  CALCIUM 10.2 9.4   GFR: CrCl cannot be calculated (Unknown ideal weight.). Liver Function Tests: Recent Labs  Lab 07/11/20 0914  AST 24  ALT 19  ALKPHOS 62  BILITOT 0.8  PROT 6.8  ALBUMIN 3.9   No results for input(s): LIPASE, AMYLASE in the last 168 hours. No results for input(s): AMMONIA in the last 168 hours. Coagulation Profile: Recent Labs  Lab 07/11/20 0914  INR 1.0   Cardiac Enzymes: No results for input(s): CKTOTAL, CKMB, CKMBINDEX, TROPONINI in the last 168 hours. BNP (last 3 results) No results for input(s): PROBNP in the last 8760 hours. HbA1C: No results for input(s): HGBA1C in the last 72 hours. CBG: Recent Labs  Lab 07/11/20 0904  GLUCAP 101*    Lipid Profile: No results for input(s): CHOL, HDL, LDLCALC, TRIG, CHOLHDL, LDLDIRECT in the last 72 hours. Thyroid Function Tests: No results for input(s): TSH, T4TOTAL, FREET4, T3FREE, THYROIDAB in the last 72 hours. Anemia Panel: No results for input(s): VITAMINB12, FOLATE, FERRITIN, TIBC, IRON, RETICCTPCT in the last 72 hours.    Radiology Studies: I have reviewed all of the imaging during this hospital visit personally     Scheduled Meds: . aspirin EC  81 mg Oral Daily  . atorvastatin  20 mg Oral Daily  . clopidogrel  75 mg Oral Daily  . diclofenac Sodium  2 g Topical QHS  . enoxaparin (LOVENOX) injection  40 mg Subcutaneous Q24H  . levothyroxine  75 mcg Oral QAC breakfast  . pantoprazole  40 mg Oral Daily  . polyethylene glycol  17  g Oral Daily  . saccharomyces boulardii  250 mg Oral Daily  . senna-docusate  2 tablet Oral BID  . sodium chloride flush  3 mL Intravenous Once   Continuous Infusions: . levETIRAcetam 750 mg (07/12/20 1029)     LOS: 0 days        Bradin Mcadory Gerome Apley, MD

## 2020-07-12 NOTE — NC FL2 (Signed)
[place] LEVEL OF CARE SCREENING TOOL     IDENTIFICATION  Patient Name: Sara Davila Birthdate: [birth year] Sex: female Admission Date (Current Location): 07/11/2020  [facility] [place] [facility] [place]:  Herbalist and Address:  [facility], [place] [address] [place], [place] [address]      Provider Number: [id number]  Attending Physician Name and Address:  Tawni Millers  Relative Name and Phone Number:       Current Level of Care: Hospital Recommended Level of Care: [place] Prior Approval Number:    Date Approved/Denied:   PASRR Number: [id number]  Discharge Plan: SNF    Current Diagnoses: Patient Active Problem List   Diagnosis Date Noted  . Left-sided weakness 07/11/2020  . History of TIA (transient ischemic attack) 07/11/2020  . TIA (transient ischemic attack) 06/20/2020  . CVA (cerebral vascular accident) ([place]) 06/19/2020  . Asymptomatic bacteriuria 06/19/2020  . HTN (hypertension), benign 06/19/2020  . Thrombocytosis ([place]) 06/19/2020  . Left hip pain 04/15/2020  . Left knee pain 03/20/2020  . Slow transit constipation 03/20/2020  . Left foot pain 03/18/2020  . Urinary retention 03/06/2020  . Blood loss anemia 03/06/2020  . Displaced intertrochanteric fracture of left femur, initial encounter for closed fracture ([place]) 03/03/2020  . Prediabetes 01/04/2020  . Memory deficit 01/04/2020  . Osteoporosis 01/04/2020  . History of vasculitis 01/04/2020  . Venous insufficiency 01/04/2020  . Neuropathy 03/30/2018  . Hyponatremia 07/21/2017  . Insomnia secondary to anxiety 07/21/2017  . GERD (gastroesophageal reflux disease) 07/21/2017  . S/P small bowel resection 07/09/2017  . Hypothyroidism 02/26/2017  . Paresthesias 09/06/2016  . Fall 03/29/2012    Orientation RESPIRATION BLADDER Height & Weight     Self, Time, Situation, Place  Normal Incontinent Weight:   Height:     BEHAVIORAL  SYMPTOMS/MOOD NEUROLOGICAL BOWEL NUTRITION STATUS      Continent Diet (see DC summary)  AMBULATORY STATUS COMMUNICATION OF NEEDS Skin   Limited Assist Verbally Normal                       Personal Care Assistance Level of Assistance  Bathing, Feeding, Dressing Bathing Assistance: Limited assistance Feeding assistance: Independent Dressing Assistance: Limited assistance     Functional Limitations Info             SPECIAL CARE FACTORS FREQUENCY  PT (By licensed PT), OT (By licensed OT)     PT Frequency: 5x/wk OT Frequency: 5x/wk            Contractures Contractures Info: Not present    Additional Factors Info  Code Status, Allergies Code Status Info: DNR Allergies Info: Codeine, Penicillins, Levaquin           Current Medications (07/12/2020):  This is the current hospital active medication list Current Facility-Administered Medications  Medication Dose Route Frequency Provider Last Rate Last Admin  . acetaminophen (TYLENOL) tablet 650 mg  650 mg Oral Q4H PRN Fuller Plan A, MD   650 mg at 07/12/20 0756   Or  . acetaminophen (TYLENOL) 160 MG/5ML solution 650 mg  650 mg Per Tube Q4H PRN Fuller Plan A, MD       Or  . acetaminophen (TYLENOL) suppository 650 mg  650 mg Rectal Q4H PRN Fuller Plan A, MD      . aspirin EC tablet 81 mg  81 mg Oral Daily Smith, Rondell A, MD   81 mg at 07/12/20 1021  .  atorvastatin (LIPITOR) tablet 20 mg  20 mg Oral Daily Smith, Rondell A, MD   20 mg at 07/12/20 1022  . clopidogrel (PLAVIX) tablet 75 mg  75 mg Oral Daily Tamala Julian, Rondell A, MD   75 mg at 07/12/20 1022  . diclofenac Sodium (VOLTAREN) 1 % topical gel 2 g  2 g Topical QHS Smith, Rondell A, MD      . enoxaparin (LOVENOX) injection 40 mg  40 mg Subcutaneous Q24H Tamala Julian, Rondell A, MD   40 mg at 07/11/20 2159  . levETIRAcetam (KEPPRA) 750 mg in sodium chloride 0.9 % 100 mL IVPB  750 mg Intravenous BID Bhagat, Srishti L, MD 430 mL/hr at 07/12/20 1029 750 mg at 07/12/20  1029  . levothyroxine (SYNTHROID) tablet 75 mcg  75 mcg Oral QAC breakfast Fuller Plan A, MD   75 mcg at 07/12/20 0630  . LORazepam (ATIVAN) tablet 0.25 mg  0.25 mg Oral BID PRN Fuller Plan A, MD   0.25 mg at 07/11/20 1615  . mupirocin ointment (BACTROBAN) 2 % 1 application  1 application Topical Daily PRN Norval Morton, MD   1 application at 61/68/37 2310  . pantoprazole (PROTONIX) EC tablet 40 mg  40 mg Oral Daily Tamala Julian, Rondell A, MD   40 mg at 07/12/20 1021  . polyethylene glycol (MIRALAX / GLYCOLAX) packet 17 g  17 g Oral Daily Smith, Rondell A, MD      . saccharomyces boulardii (FLORASTOR) capsule 250 mg  250 mg Oral Daily Smith, Rondell A, MD   250 mg at 07/12/20 1021  . senna-docusate (Senokot-S) tablet 2 tablet  2 tablet Oral BID Norval Morton, MD   2 tablet at 07/11/20 2159  . sodium chloride flush (NS) 0.9 % injection 3 mL  3 mL Intravenous Once Pattricia Boss, MD      . zolpidem (AMBIEN) tablet 2.5 mg  2.5 mg Oral BID BM & HS PRN Norval Morton, MD         Discharge Medications: Please see discharge summary for a list of discharge medications.  Relevant Imaging Results:  Relevant Lab Results:   Additional Information SS#: [id number]  Geralynn Ochs, LCSW

## 2020-07-12 NOTE — Plan of Care (Signed)
  Problem: Education: Goal: Knowledge of patient specific risk factors addressed and post discharge goals established will improve Outcome: Progressing   Problem: Education: Goal: Knowledge of secondary prevention will improve Outcome: Progressing   

## 2020-07-12 NOTE — Evaluation (Signed)
Physical Therapy Evaluation Patient Details Name: Sara Davila MRN: [id number] DOB: 09/17/1930 Today's Date: 07/12/2020   History of Present Illness  Pt is a 86 y/o female with PMH of TIA, hypothyroidism, neuropathy, anxiety, pneumonia, fibromyalgia, L femur fx s/p IM nail on 03/03/20, and insomnia who presented with complaints of L sided weakness. CT and MRI negative for acute abnormalities. 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     Clinical Impression  Pt admitted with above diagnosis. Pt currently with functional limitations due to the deficits listed below (see PT Problem List). At the time of PT eval pt was able to perform transfers with up to +2 mod assist, however we were unable to progress to gait training. Son, Lennette Bihari present during session and very interested in CIR as an option for d/c. At this time I do not think the pt would be able to tolerate the increased intensity of rehab at CIR. Today's session focused on transfer to/from [facility] only due to weakness/fatigue. I explained that we could consult CIR per his request, and if the pt perks up and is able to participate more with acute therapy, then a CIR admission would become more reasonable from a PT standpoint. He understands that there are a number of things that need to align for the pt to be considered for a rehab admission. He also voiced understanding that if the pt did not progress with acute therapy, then continued rehab at the SNF level would be most appropriate. Acutely, pt will benefit from skilled PT to increase their independence and safety with mobility to allow discharge to the venue listed below.       Follow Up Recommendations CIR    Equipment Recommendations  None recommended by PT    Recommendations for Other Services Rehab consult      Precautions / Restrictions Precautions Precautions: Fall Restrictions Weight Bearing Restrictions: No      Mobility  Bed Mobility Overal bed mobility: Needs Assistance Bed Mobility: Supine to Sit;Sit to Supine     Supine to sit: Mod assist Sit to supine: Max assist   General bed mobility comments: min assist to elevated trunk to EOB, max assist to return supine for turnk and LB support   Transfers Overall transfer level: Needs assistance Equipment used: 2 person hand held assist Transfers: Sit to/from [facility] Sit to Stand: Min assist;+2 physical assistance Stand pivot transfers: Mod assist;+2 physical assistance       General transfer comment: Decreased initiation of movement and pt with difficulty advancing her feet around to take pivotal steps bed<>bedside commode. Increased assist to facilitate weight shift and pivot.   Ambulation/Gait             General Gait Details: Unable to progress to gait training at this time.  Stairs            Wheelchair Mobility    Modified Rankin (Stroke Patients Only) Modified Rankin (Stroke Patients Only) Pre-Morbid Rankin Score: Moderate disability Modified Rankin: Moderately severe disability     Balance Overall balance assessment: Needs assistance Sitting-balance support: Feet supported Sitting balance-Leahy Scale: Fair Sitting balance - Comments: inital R lateral lean but once settled able to maintain with min guard    Standing balance support: Bilateral upper extremity supported;During functional activity Standing balance-Leahy Scale: Poor Standing balance comment: relies on external and UE suport  Pertinent Vitals/Pain Pain Assessment: Faces Faces Pain Scale: No hurt Pain Intervention(s): Monitored during session    Home Living Family/patient expects to be discharged to:: Assisted living Living Arrangements: Alone Available Help at Discharge:  Other (Comment) (Assisted Living) Type of Home: Apartment Home Access: Level entry     Home Layout: One level Home Equipment: Walker - 4 wheels;Shower seat;Tub bench;Grab bars - toilet;Grab bars - tub/shower;Hand held shower head Additional Comments: Son present and confirmed that pt is in the Assisted Living portion of [facility]    Prior Function Level of Independence: Independent with assistive device(s)         Comments: reports independent using rollator at home, completing ADLs independently      Hand Dominance   Dominant Hand: Left    Extremity/Trunk Assessment   Upper Extremity Assessment Upper Extremity Assessment: Defer to OT evaluation    Lower Extremity Assessment Lower Extremity Assessment: Difficult to assess due to impaired cognition (Quad strength WFL but unable to test hams/hip flexors)    Cervical / Trunk Assessment Cervical / Trunk Assessment: Kyphotic  Communication   Communication: No difficulties  Cognition Arousal/Alertness: Lethargic Behavior During Therapy: Flat affect Overall Cognitive Status: Impaired/Different from baseline Area of Impairment: Orientation;Attention;Memory;Following commands;Safety/judgement;Awareness;Problem solving                 Orientation Level: Disoriented to;Situation;Place Current Attention Level: Focused Memory: Decreased recall of precautions;Decreased short-term memory Following Commands: Follows one step commands inconsistently;Follows one step commands with increased time Safety/Judgement: Decreased awareness of safety;Decreased awareness of deficits Awareness: Intellectual Problem Solving: Slow processing;Decreased initiation;Difficulty sequencing;Requires verbal cues;Requires tactile cues General Comments: Asking if she is at [facility]. Pleasantly confused throughout session. Asking why there's no toilet in this hospital and states she does not trust the staff several times. Explained that we  were using the bedside commode and [employer] since the patient is not able to walk to the bathroom right now.       General Comments General comments (skin integrity, edema, etc.): pt reports dizziness and lightheadedness, VSS during session; keeps eyes closed    Exercises     Assessment/Plan    PT Assessment Patient needs continued PT services  PT Problem List Decreased strength;Decreased balance;Decreased mobility;Decreased coordination       PT Treatment Interventions DME instruction;Gait training;Functional mobility training;Stair training;Therapeutic activities;Therapeutic exercise;Balance training;Neuromuscular re-education;Patient/family education    PT Goals (Current goals can be found in the Care Plan section)  Acute Rehab PT Goals Patient Stated Goal: None stated PT Goal Formulation: With patient Time For Goal Achievement: 07/26/20 Potential to Achieve Goals: Good    Frequency Min 3X/week   Barriers to discharge        Co-evaluation               AM-PAC PT "6 Clicks" Mobility  Outcome Measure Help needed turning from your back to your side while in a flat bed without using bedrails?: A Little Help needed moving from lying on your back to sitting on the side of a flat bed without using bedrails?: A Lot Help needed moving to and from a bed to a chair (including a wheelchair)?: A Lot Help needed standing up from a chair using your arms (e.g., wheelchair or bedside chair)?: A Lot Help needed to walk in hospital room?: Total Help needed climbing 3-5 steps with a railing? : Total 6 Click Score: 11    End of Session Equipment Utilized During Treatment: Gait belt Activity Tolerance: Patient tolerated treatment  well Patient left: in bed;with call bell/phone within reach;with chair alarm set;with family/visitor present Nurse Communication: Mobility status PT Visit Diagnosis: Other abnormalities of gait and mobility (R26.89)    Time: 1341-1411 PT Time  Calculation (min) (ACUTE ONLY): 30 min   Charges:   PT Evaluation $PT Eval Moderate Complexity: 1 Mod PT Treatments $Gait Training: 8-22 mins        Rolinda Roan, PT, DPT Acute Rehabilitation Services Pager: [phone] Office: [phone]   Thelma Comp 07/12/2020, 2:54 PM

## 2020-07-12 NOTE — Evaluation (Addendum)
Occupational Therapy Evaluation Patient Details Name: Sara Davila MRN: [id number] DOB: 05/19/1930 Today's Date: 07/12/2020    History of Present Illness Pt is a 86 y/o female with PMH of TIA, hypothyroidism, neuropathy, anxiety, pneumonia, fibromyalgia, L femur fx s/p IM nail on 03/03/20, and insomnia who presented with complaints of L sided weakness. CT and MRI negative for acute abnormalities. 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    Clinical Impression   PTA patient reports independent with ADLs, mobility using rollator but noted limited historian. She reports living at [facility] in apartment, but per chart review has recently moved to ALF portion. Patient is pleasantly confused throughout session, oriented to self and time, place but reports "in the supply closet of the hospital"; decreased awareness to situation, poor safety, problem solving.  Questionable vision as patient unable to maintain eyes open during session, at times reports hearing multiple voices and asking where everyone is.  Perseverating on topics during session as well, see below.  Overall, limited by impaired cognition, confusion, vision, balance, and generalized weakness. Patient currently requires min-total assist for ADLs, min assist for transfers and min- max assist for bed mobility.  She will benefit from further OT services while admitted and after dc to optimize independence and safety for ADLs, mobility.   After PT speaking to son, reports son very interested in [place] for patient's rehab.  Updated dc plan to CIR, if patient doesn't not progress will need SNF rehab.     Follow Up Recommendations   CIR (if doesn't progress will need to look into SNF)    Equipment Recommendations  Other (comment) (TBD at next venue of care )     Recommendations for Other Services PT consult;Speech consult     Precautions / Restrictions Precautions Precautions: Fall Restrictions Weight Bearing Restrictions: No      Mobility Bed Mobility Overal bed mobility: Needs Assistance Bed Mobility: Supine to Sit;Sit to Supine     Supine to sit: Min assist Sit to supine: Max assist   General bed mobility comments: min assist to elevated trunk to EOB, max assist to return supine for turnk and LB support   Transfers Overall transfer level: Needs assistance Equipment used: Rolling walker (2 wheeled) Transfers: Sit to/from Stand Sit to Stand: Min assist         General transfer comment: min assist sit to stand after placing B hands on walker, assist to power up and steady; limited tolerance     Balance Overall balance assessment: Needs assistance Sitting-balance support: Feet supported Sitting balance-Leahy Scale: Fair Sitting balance - Comments: inital R lateral lean but once settled able to maintain with min guard    Standing balance support: Bilateral upper extremity supported;During functional activity Standing balance-Leahy Scale: Poor Standing balance comment: relies on external and UE suport                           ADL either performed or assessed with clinical judgement   ADL Overall ADL's : Needs assistance/impaired     Grooming: Wash/dry hands;Wash/dry face;Bed level;Minimal assistance Grooming Details (indicate cue type and reason): min assist to redirect to washing hands, perseverating on face  Upper Body Bathing: Moderate assistance;Sitting   Lower Body Bathing: Total assistance;Sit to/from stand   Upper Body Dressing : Maximal assistance;Sitting Upper Body Dressing Details (  indicate cue type and reason): pt attempting to pull gown off throughout session, max assist to keep on Lower Body Dressing: Total assistance;Sit to/from stand Lower Body Dressing Details (indicate cue type and reason):  assist for socks, min assist sit to stand    Toilet Transfer Details (indicate cue type and reason): deferred due to safety          Functional mobility during ADLs: Minimal assistance;Rolling walker;Cueing for safety;Cueing for sequencing General ADL Comments: pt limited by confusion and cognition, impaired balance and generalized weakness      Vision Baseline Vision/History: Wears glasses Wears Glasses: At all times Patient Visual Report: No change from baseline Additional Comments: glasses not present, reports no changes; but pt requires constant cueing to keep her eyes open during session, unable to locate or read clock with maximal  verbal cues, and reports there is "10 walkers" not knowing which to grab      Perception     Praxis      Pertinent Vitals/Pain Pain Assessment: Faces Faces Pain Scale: Hurts even more Pain Location: headache Pain Descriptors / Indicators: Discomfort;Headache Pain Intervention(s): Limited activity within patient's tolerance;Monitored during session;Repositioned;Premedicated before session;Other (comment) (RN notified )     Hand Dominance     Extremity/Trunk Assessment Upper Extremity Assessment Upper Extremity Assessment: Generalized weakness   Lower Extremity Assessment Lower Extremity Assessment: Defer to PT evaluation   Cervical / Trunk Assessment Cervical / Trunk Assessment: Kyphotic   Communication Communication Communication: No difficulties   Cognition Arousal/Alertness: Lethargic Behavior During Therapy: Flat affect;Anxious Overall Cognitive Status: Impaired/Different from baseline Area of Impairment: Orientation;Attention;Memory;Following commands;Safety/judgement;Awareness;Problem solving                 Orientation Level: Disoriented to;Place;Situation (reports hospital but reports "supply room") Current Attention Level: Focused Memory: Decreased recall of precautions;Decreased short-term memory Following Commands:  Follows one step commands inconsistently;Follows one step commands with increased time Safety/Judgement: Decreased awareness of safety;Decreased awareness of deficits Awareness: Intellectual Problem Solving: Slow processing;Decreased initiation;Difficulty sequencing;Requires verbal cues;Requires tactile cues General Comments: patient oriented to self and time, unable to report situation and reports she is in the supply room at the hopstial.  She follows 1 step commands inconsistenly, keeps eyes closed during session, requires constant redirection to task and is pleasantly confused.  Perseverates on "when is her son coming" and "there is no toilet in this hospital".  Continue assessment    General Comments  pt reports dizziness and headache, VSS during session; keeps eyes closed    Exercises     Shoulder Instructions      Home Living Family/patient expects to be discharged to:: Private residence Living Arrangements: Alone   Type of Home: Apartment Home Access: Level entry     Home Layout: One level               Home Equipment: Walker - 4 wheels;Shower seat;Tub bench;Grab bars - toilet;Grab bars - tub/shower;Hand held shower head   Additional Comments: limited historian--patient reporting [facility], per chart review moved to ALF portion?       Prior Functioning/Environment Level of Independence: Independent with assistive device(s)        Comments: reports independent using rollator at home, completing ADLs independently         OT Problem List: Decreased strength;Decreased activity tolerance;Impaired balance (sitting and/or standing);Impaired vision/perception;Decreased coordination;Decreased cognition;Decreased safety awareness;Decreased knowledge of use of DME or AE;Decreased knowledge of precautions;Pain      OT  Treatment/Interventions: Self-care/ADL training;DME and/or AE instruction;Therapeutic activities;Patient/family  education;Therapeutic exercise;Visual/perceptual remediation/compensation;Cognitive remediation/compensation;Balance training    OT Goals(Current goals can be found in the care plan section) Acute Rehab OT Goals Patient Stated Goal: to get to breakfast with my friends  OT Goal Formulation: With patient Time For Goal Achievement: 07/26/20 Potential to Achieve Goals: Fair  OT Frequency: Min 2X/week   Barriers to D/C:            Co-evaluation              AM-PAC OT "6 Clicks" Daily Activity     Outcome Measure Help from another person eating meals?: A Lot Help from another person taking care of personal grooming?: A Lot Help from another person toileting, which includes using toliet, bedpan, or urinal?: Total Help from another person bathing (including washing, rinsing, drying)?: A Lot Help from another person to put on and taking off regular upper body clothing?: A Lot Help from another person to put on and taking off regular lower body clothing?: Total 6 Click Score: 10   End of Session Equipment Utilized During Treatment: Gait belt;Rolling walker Nurse Communication: Mobility status;Other (comment);Precautions (cognition/confusion)  Activity Tolerance: Patient limited by lethargy;Other (comment) (cognition) Patient left: in bed;with call bell/phone within reach;with bed alarm set  OT Visit Diagnosis: Other abnormalities of gait and mobility (R26.89);Muscle weakness (generalized) (M62.81);Pain;Other symptoms and signs involving cognitive function Pain - part of body:  (headache )                Time: 0233-4356 OT Time Calculation (min): 23 min Charges:  OT General Charges $OT Visit: 1 Visit OT Evaluation $OT Eval Moderate Complexity: 1 Mod OT Treatments $Self Care/Home Management : 8-22 mins  Jolaine Artist, OT Acute Rehabilitation Services Pager [phone] Office [phone]   Delight Stare 07/12/2020, 10:12 AM

## 2020-07-12 NOTE — Progress Notes (Addendum)
Neurology Progress Note  Patient ID: JAYLANIE BOSCHEE is a 86 y.o. with PMHx of  has a past medical history of Abdominal pain (02/26/2017), Anxiety, Arthritis, Cervical vertebral fusion, Diverticulitis, Diverticulosis, Fibromyalgia, GERD (gastroesophageal reflux disease), Gout (02/2018), Hypothyroidism, Pelvis fracture ([place]), Peritoneal free air (03/29/2012), Pneumonia, Pneumoperitoneum (02/26/2017), Thyroid disorder, TIA (transient ischemic attack), and Toe pain (09/06/2016).  Subjective: Patient was sleeping comfortably on my evaluation, I did not awaken her Per nursing report there were no further episodes of left-sided neglect  Exam: Vitals:   07/12/20 1620 07/12/20 2011  BP: (!) 99/52 (!) 106/57  Pulse: 85 85  Resp: 16 18  Temp: 97.7 F (36.5 C) 97.9 F (36.6 C)  SpO2: 94% 94%   Gen: In bed, NAD Resp: non-labored breathing, no acute distress Abd: soft, nt  Pertinent Labs: Hyponatremia improving to 131  Impression: EMARY ZALAR is a 86 y.o. presenting with altered mental status likely secondary to intermittent seizures.  Recommendations: -Continue Keppra 750 mg twice daily -We will reexamine in the morning and confirm with primary team that there is no further evidence of neglect or left-sided weakness  Lesleigh Noe MD-PhD [facility] [phone]

## 2020-07-12 NOTE — Progress Notes (Signed)
Rehab Admissions Coordinator Note:  Patient was screened by Cleatrice Burke for appropriateness for an Inpatient Acute Rehab Consult per therapy and son request.   At this time, we are recommending Inpatient Rehab consult. I will place order per protocol.  Cleatrice Burke RN MSN 07/12/2020, 6:20 PM  I can be reached at [phone].

## 2020-07-12 NOTE — TOC Initial Note (Signed)
Transition of Care [facility]) - Initial/Assessment Note    Patient Details  Name: Sara Davila MRN: [id number] Date of Birth: 04-18-1930  [facility] CM/SW Contact:    Geralynn Ochs, LCSW Phone Number: 07/12/2020, 3:48 PM  Clinical Narrative:     CSW met with patient's son Lennette Bihari at bedside, to discuss discharge plans. Lennette Bihari would prefer seeing if patient would qualify for CIR, as she has historically bounced back quickly after these episodes. Lennette Bihari said that he doesn't think the therapy at [facility] is the best, so he'd like to pursue CIR as first choice. PT placed recommendation for CIR. CSW confirmed bed availability for SNF at [facility], if need be. CSW to follow.     Expected Discharge Plan: Skilled Nursing Facility Barriers to Discharge: Insurance Authorization, Continued Medical Work up   Patient Goals and CMS Choice Patient states their goals for this hospitalization and ongoing recovery are:: patient unable to participate in goal setting due to disorientation CMS Medicare.gov Compare Post Acute Care list provided to:: Patient Represenative (must comment) Choice offered to / list presented to : Adult Children  Expected Discharge Plan and Services Expected Discharge Plan: [place] arrangements for the past 2 months: [place]                                      Prior Living Arrangements/Services Living arrangements for the past 2 months: [place] Lives with:: Facility Resident Patient language and need for interpreter reviewed:: No Do you feel safe going back to the place where you live?: Yes      Need for Family Participation in Patient Care: Yes (Comment) Care giver support system in place?: Yes (comment)   Criminal Activity/Legal Involvement Pertinent to Current Situation/Hospitalization: No - Comment as needed  Activities of Daily Living Home Assistive Devices/Equipment:  Walker (specify type) ADL Screening (condition at time of admission) Patient's cognitive ability adequate to safely complete daily activities?: Yes Is the patient deaf or have difficulty hearing?: No Does the patient have difficulty seeing, even when wearing glasses/contacts?: No Does the patient have difficulty concentrating, remembering, or making decisions?: No Patient able to express need for assistance with ADLs?: Yes Does the patient have difficulty dressing or bathing?: No Independently performs ADLs?: No Communication: Needs assistance Is this a change from baseline?: Change from baseline, expected to last <3 days Dressing (OT): Needs assistance Is this a change from baseline?: Pre-admission baseline Grooming: Needs assistance Is this a change from baseline?: Pre-admission baseline Feeding: Needs assistance Bathing: Needs assistance Toileting: Needs assistance Is this a change from baseline?: Pre-admission baseline In/Out Bed: Needs assistance Is this a change from baseline?: Pre-admission baseline Walks in Home: Independent with device (comment) Weakness of Arms/Hands: None  Permission Sought/Granted Permission sought to share information with : Facility Sport and exercise psychologist, Family Supports Permission granted to share information with : Yes, Verbal Permission Granted  Share Information with NAME: Raelyn Ensign, Lennette Bihari  Permission granted to share info w AGENCY: [facility]  Permission granted to share info w Relationship: Daughter-in-Law, Son     Emotional Assessment Appearance:: Appears stated age Attitude/Demeanor/Rapport: Unable to Assess Affect (typically observed): Unable to Assess Orientation: : Oriented to Self Alcohol / Substance Use: Not Applicable Psych Involvement: No (comment)  Admission diagnosis:  Weakness [R53.1] LUE numbness [R20.0] Left-sided weakness [R53.1] Seizure ([place]) [R56.9] Patient  Active Problem List   Diagnosis Date Noted   Seizure ([place])  07/12/2020   Left-sided weakness 07/11/2020   History of TIA (transient ischemic attack) 07/11/2020   TIA (transient ischemic attack) 06/20/2020   CVA (cerebral vascular accident) ([place]) 06/19/2020   Asymptomatic bacteriuria 06/19/2020   HTN (hypertension), benign 06/19/2020   Thrombocytosis ([place]) 06/19/2020   Left hip pain 04/15/2020   Left knee pain 03/20/2020   Slow transit constipation 03/20/2020   Left foot pain 03/18/2020   Urinary retention 03/06/2020   Blood loss anemia 03/06/2020   Displaced intertrochanteric fracture of left femur, initial encounter for closed fracture ([place]) 03/03/2020   Prediabetes 01/04/2020   Memory deficit 01/04/2020   Osteoporosis 01/04/2020   History of vasculitis 01/04/2020   Venous insufficiency 01/04/2020   Neuropathy 03/30/2018   Hyponatremia 07/21/2017   Insomnia secondary to anxiety 07/21/2017   GERD (gastroesophageal reflux disease) 07/21/2017   S/P small bowel resection 07/09/2017   Hypothyroidism 02/26/2017   Paresthesias 09/06/2016   Fall 03/29/2012   PCP:  [name], MD Pharmacy:   [facility], [place] [address] Phone: [phone] Fax: [phone]  [facility] [place], [place] [address] [place] [address] Phone: [phone] Fax: [place], [place] [address] [place] [address] Phone: [phone] Fax: [phone]     Social Determinants of Health ([place]) Interventions    Readmission Risk Interventions No flowsheet data found.

## 2020-07-13 DIAGNOSIS — R569 Unspecified convulsions: Secondary | ICD-10-CM | POA: Diagnosis not present

## 2020-07-13 LAB — BASIC METABOLIC PANEL
Anion gap: 8 (ref 5–15)
BUN: 16 mg/dL (ref 8–23)
CO2: 24 mmol/L (ref 22–32)
Calcium: 8.7 mg/dL — ABNORMAL LOW (ref 8.9–10.3)
Chloride: 101 mmol/L (ref 98–111)
Creatinine, Ser: 0.72 mg/dL (ref 0.44–1.00)
GFR calc Af Amer: >60 mL/min (ref >60)
GFR calc non Af Amer: >60 mL/min (ref >60)
Glucose, Bld: 108 mg/dL — ABNORMAL HIGH (ref 70–99)
Potassium: 3.7 mmol/L (ref 3.5–5.1)
Sodium: 133 mmol/L — ABNORMAL LOW (ref 135–145)

## 2020-07-13 MED ORDER — POTASSIUM CHLORIDE 20 MEQ PO PACK
40.0000 meq | PACK | Freq: Once | ORAL | Status: AC
  Administered 2020-07-13: 40 meq via ORAL
  Filled 2020-07-13: qty 2

## 2020-07-13 MED ORDER — MELATONIN 3 MG PO TABS
3.0000 mg | ORAL_TABLET | Freq: Every day | ORAL | Status: DC
  Administered 2020-07-13 – 2020-07-16 (×3): 3 mg via ORAL
  Filled 2020-07-13 (×4): qty 1

## 2020-07-13 NOTE — Consult Note (Signed)
Physical Medicine and Rehabilitation Consult Reason for Consult: Impaired mobility and ADLs following seizure Referring Physician: Jimmy Picket Arrien   HPI: Sara Davila is a 86 y.o. female with a PMH of TIA, hypothyroidism, neuropathy, anxiety, pneumonia, fibromyalgia, L femur fracture s/p IM nail on 03/03/20, and insomnia, who presented with left sided weakness. CT and MRI were negative for acute abnormalities. EEG showed epileptogenicity from right frontal region as well as right hemisphere cortical dysfunction. Physical Medicine & Rehabilitation was consulted to assess candidacy for CIR.    Review of Systems  Constitutional: Negative for chills and fever.  HENT: Negative for hearing loss and tinnitus.   Eyes: Negative for blurred vision and double vision.  Respiratory: Negative for cough and hemoptysis.   Cardiovascular: Negative for chest pain and palpitations.  Gastrointestinal: Negative for heartburn and nausea.  Genitourinary: Negative for dysuria and urgency.  Musculoskeletal: Negative for myalgias and neck pain.  Skin: Negative for rash.  Neurological: Negative for dizziness and headaches.  Endo/Heme/Allergies: Negative for environmental allergies. Does not bruise/bleed easily.  Psychiatric/Behavioral: Negative for depression and suicidal ideas.   Past Medical History:  Diagnosis Date  . Abdominal pain 02/26/2017  . Anxiety   . Arthritis    Left Knee, Wrist, Back and Hips  . Cervical vertebral fusion   . Diverticulitis   . Diverticulosis   . Fibromyalgia   . GERD (gastroesophageal reflux disease)   . Gout 02/2018   L hand  . Hypothyroidism   . Pelvis fracture ([place])   . Peritoneal free air 03/29/2012  . Pneumonia   . Pneumoperitoneum 02/26/2017  . Thyroid disorder   . TIA (transient ischemic attack)   . Toe pain 09/06/2016   Past Surgical History:  Procedure Laterality Date  . ABDOMINAL HYSTERECTOMY    . BOWEL RESECTION N/A 07/09/2017   Procedure:  SMALL BOWEL RESECTION;  Surgeon: Johnathan Hausen, MD;  Location: [facility];  Service: General;  Laterality: N/A;  . CERVICAL FUSION     x2  . FEMUR IM NAIL Left 03/03/2020   Procedure: INTRAMEDULLARY (IM) NAIL FEMORAL;  Surgeon: Paralee Cancel, MD;  Location: [facility];  Service: Orthopedics;  Laterality: Left;  . LAPAROSCOPIC APPENDECTOMY N/A 03/04/2017   Procedure: APPENDECTOMY LAPAROSCOPIC;  Surgeon: Johnathan Hausen, MD;  Location: [facility];  Service: General;  Laterality: N/A;  . LAPAROSCOPY N/A 03/04/2017   Procedure: LAPAROSCOPY DIAGNOSTIC, ENTEROLYSIS;  Surgeon: Johnathan Hausen, MD;  Location: [facility];  Service: General;  Laterality: N/A;  . LAPAROTOMY N/A 07/09/2017   Procedure: EXPLORATORY LAPAROTOMY;  Surgeon: Johnathan Hausen, MD;  Location: [facility];  Service: General;  Laterality: N/A;  . SPINE SURGERY     Lumbar- rod placement  . TONSILLECTOMY     86y/o  . TOTAL VAGINAL HYSTERECTOMY     Family History  Problem Relation Age of Onset  . Asthma Mother   . Pulmonary embolism Mother   . Macular degeneration Mother   . Heart disease Father   . Stroke Father   . Stroke Paternal Uncle   . Breast cancer Maternal Aunt   . Macular degeneration Sister   . Stroke Sister   . Neuropathy Neg Hx    Social History:  reports that she has quit smoking. She has never used smokeless tobacco. She reports current alcohol use. She reports that she does not use drugs. Allergies:  Allergies  Allergen Reactions  . Codeine Rash  . Penicillins Rash    Has patient had a PCN reaction  causing immediate rash, facial/tongue/throat swelling, SOB or lightheadedness with hypotension: No Has patient had a PCN reaction causing severe rash involving mucus membranes or skin necrosis: No Has patient had a PCN reaction that required hospitalization: No Has patient had a PCN reaction occurring within the last 10 years: No If all of the above answers are "NO", then may proceed with Cephalosporin use.   . Levaquin [Levofloxacin]     Medications Prior to Admission  Medication Sig Dispense Refill  . Acetaminophen (TYLENOL EXTRA STRENGTH PO) Take 500 mg by mouth in the morning and at bedtime.     Marland Kitchen amLODipine (NORVASC) 2.5 MG tablet Take 1 tablet (2.5 mg total) by mouth daily. 60 tablet 0  . Apoaequorin (PREVAGEN) 10 MG CAPS Take 10 mg by mouth daily.    Marland Kitchen aspirin EC 81 MG EC tablet Take 1 tablet (81 mg total) by mouth daily. Swallow whole. 360 tablet 0  . atorvastatin (LIPITOR) 20 MG tablet Take 1 tablet (20 mg total) by mouth daily. 90 tablet 0  . Calcium Carbonate (CALCIUM 500 PO) Take 1 tablet by mouth daily.    . Carboxymethylcellulose Sod PF (THERATEARS PF) 0.25 % SOLN Apply 1 drop to eye in the morning and at bedtime.    . [EXPIRED] clopidogrel (PLAVIX) 75 MG tablet Take 1 tablet (75 mg total) by mouth daily for 21 days. 21 tablet 0  . diclofenac Sodium (VOLTAREN) 1 % GEL Apply topically in the morning and at bedtime. To knee     . docusate sodium (COLACE) 100 MG capsule Take 100 mg by mouth daily as needed for mild constipation.    . ibandronate (BONIVA) 150 MG tablet Take 150 mg by mouth every 30 (thirty) days. Take in the morning with a full glass of water, on an empty stomach, and do not take anything else by mouth or lie down for the next 30 min.    Marland Kitchen ibuprofen (ADVIL) 200 MG tablet Take 200 mg by mouth daily as needed for headache.     . levothyroxine (SYNTHROID) 75 MCG tablet Take 1 tablet (75 mcg total) by mouth daily before breakfast. 30 tablet 1  . LORazepam (ATIVAN) 0.5 MG tablet Take 0.5 tablets (0.25 mg total) by mouth 2 (two) times daily as needed for anxiety. 30 tablet 0  . mupirocin ointment (BACTROBAN) 2 % Apply 1 application topically daily as needed (nose sores).     Marland Kitchen omeprazole (PRILOSEC) 20 MG capsule Take 20 mg by mouth daily as needed (indigestion.).     Marland Kitchen polyethylene glycol (MIRALAX / GLYCOLAX) 17 g packet Take 17 g by mouth daily.     Marland Kitchen saccharomyces boulardii (FLORASTOR) 250 MG capsule  Take 250 mg by mouth daily.     Marland Kitchen senna-docusate (SENOKOT-S) 8.6-50 MG tablet Take 2 tablets by mouth 2 (two) times daily.    . sodium fluoride (DENTAGEL) 1.1 % GEL dental gel Place 1 application onto teeth daily.    . traMADol (ULTRAM) 50 MG tablet Take 50 mg by mouth every 6 (six) hours as needed for moderate pain.    . Vitamin D, Cholecalciferol, 50 MCG (2000 UT) CAPS Take 1 tablet by mouth daily.    Marland Kitchen zolpidem (AMBIEN) 5 MG tablet Take 0.5 tablets (2.5 mg total) by mouth at bedtime as needed for sleep. (Patient taking differently: Take 2.5 mg by mouth at bedtime. ) 15 tablet 0    Home: Home Living Family/patient expects to be discharged to:: Assisted living Living Arrangements: Alone  Available Help at Discharge: Other (Comment) (Assisted living ) Type of Home: Assisted living Home Access: Level entry Home Layout: One level Home Equipment: Walker - 4 wheels, Shower seat, Tub bench, Grab bars - toilet, Grab bars - tub/shower, Hand held shower head Additional Comments: Son present and confirmed that pt is in the Assisted Living portion of [facility]  Lives With:  (Care staff)  Functional History: Prior Function Level of Independence: Independent with assistive device(s) Comments: reports independent using rollator at home, completing ADLs independently  Functional Status:  Mobility: Bed Mobility Overal bed mobility: Needs Assistance Bed Mobility: Supine to Sit, Sit to Supine Supine to sit: Mod assist Sit to supine: Max assist General bed mobility comments: min assist to elevated trunk to EOB, max assist to return supine for turnk and LB support  Transfers Overall transfer level: Needs assistance Equipment used: 2 person hand held assist Transfers: Sit to/from Stand, Stand Pivot Transfers Sit to Stand: Min assist, +2 physical assistance Stand pivot transfers: Mod assist, +2 physical assistance General transfer comment: Decreased initiation of movement and pt with difficulty  advancing her feet around to take pivotal steps bed<>bedside commode. Increased assist to facilitate weight shift and pivot.  Ambulation/Gait General Gait Details: Unable to progress to gait training at this time.    ADL: ADL Overall ADL's : Needs assistance/impaired Grooming: Wash/dry hands, Wash/dry face, Bed level, Minimal assistance Grooming Details (indicate cue type and reason): min assist to redirect to washing hands, perseverating on face  Upper Body Bathing: Moderate assistance, Sitting Lower Body Bathing: Total assistance, Sit to/from stand Upper Body Dressing : Maximal assistance, Sitting Upper Body Dressing Details (indicate cue type and reason): pt attempting to pull gown off throughout session, max assist to keep on Lower Body Dressing: Total assistance, Sit to/from stand Lower Body Dressing Details (indicate cue type and reason): assist for socks, min assist sit to stand  Toilet Transfer Details (indicate cue type and reason): deferred due to safety  Functional mobility during ADLs: Minimal assistance, Rolling walker, Cueing for safety, Cueing for sequencing General ADL Comments: pt limited by confusion and cognition, impaired balance and generalized weakness   Cognition: Cognition Overall Cognitive Status: Impaired/Different from baseline Arousal/Alertness: Awake/alert Orientation Level: Oriented X4 Attention: Sustained Sustained Attention: Impaired Sustained Attention Impairment: Verbal basic Memory: Impaired Memory Impairment: Decreased short term memory Decreased Short Term Memory: Verbal basic, Functional basic Awareness: Appears intact Problem Solving: Impaired Problem Solving Impairment: Verbal basic, Functional basic, Functional complex, Verbal complex Executive Function: Writer: Impaired Organizing Impairment: Functional basic Behaviors: Perseveration Safety/Judgment: Appears intact Cognition Arousal/Alertness: Lethargic Behavior During  Therapy: Flat affect Overall Cognitive Status: Impaired/Different from baseline Area of Impairment: Orientation, Attention, Memory, Following commands, Safety/judgement, Awareness, Problem solving Orientation Level: Disoriented to, Situation, Place Current Attention Level: Focused Memory: Decreased recall of precautions, Decreased short-term memory Following Commands: Follows one step commands inconsistently, Follows one step commands with increased time Safety/Judgement: Decreased awareness of safety, Decreased awareness of deficits Awareness: Intellectual Problem Solving: Slow processing, Decreased initiation, Difficulty sequencing, Requires verbal cues, Requires tactile cues General Comments: Asking if she is at [facility]. Pleasantly confused throughout session. Asking why there's no toilet in this hospital and states she does not trust the staff several times. Explained that we were using the bedside commode and Purewick since the patient is not able to walk to the bathroom right now.   Blood pressure (!) 113/59, pulse 85, temperature (!) 97.5 F (36.4 C), temperature source Oral, resp. rate 18, SpO2  94 %. Physical Exam   General: Alert and oriented x 3, No apparent distress HEENT: Head is normocephalic, atraumatic, no neglect, sclera anicteric, oral mucosa pink and moist, dentition intact, ext ear canals clear,  Neck: Supple without JVD or lymphadenopathy Heart: Reg rate and rhythm. No murmurs rubs or gallops Chest: CTA bilaterally without wheezes, rales, or rhonchi; no distress Abdomen: Soft, non-tender, non-distended, bowel sounds positive. Extremities: No clubbing, cyanosis, or edema. Pulses are 2+ Skin: Clean and intact without signs of breakdown Neuro: Pt is cognitively appropriate with normal insight, memory, and awareness. Cranial nerves 2-12 are intact. Sensory exam is normal. Reflexes are 2+ in all 4's. Fine motor coordination is intact. No tremors. Motor function is grossly  5/5. Difficulties with mathematics Psych: Pt's affect is appropriate. Pt is cooperative   Results for orders placed or performed during the hospital encounter of 07/11/20 (from the past 24 hour(s))  Basic metabolic panel     Status: Abnormal   Collection Time: 07/13/20  2:55 AM  Result Value Ref Range   Sodium 133 (L) 135 - 145 mmol/L   Potassium 3.7 3.5 - 5.1 mmol/L   Chloride 101 98 - 111 mmol/L   CO2 24 22 - 32 mmol/L   Glucose, Bld 108 (H) 70 - 99 mg/dL   BUN 16 8 - 23 mg/dL   Creatinine, Ser 0.72 0.44 - 1.00 mg/dL   Calcium 8.7 (L) 8.9 - 10.3 mg/dL   GFR calc non Af Amer >60 >60 mL/min   GFR calc Af Amer >60 >60 mL/min   Anion gap 8 5 - 15   MR BRAIN W WO CONTRAST  Result Date: 07/11/2020 CLINICAL DATA:  Left-sided weakness neglect EXAM: MRI HEAD WITHOUT AND WITH CONTRAST TECHNIQUE: Multiplanar, multiecho pulse sequences of the brain and surrounding structures were obtained without and with intravenous contrast. CONTRAST:  34mL GADAVIST GADOBUTROL 1 MMOL/ML IV SOLN COMPARISON:  06/19/2020 FINDINGS: Motion artifact is present. Brain: There is no acute infarction or intracranial hemorrhage. There is no intracranial mass, mass effect, or edema. There is no hydrocephalus or extra-axial fluid collection. Prominence of the ventricles and sulci reflects generalized parenchymal volume loss similar to the prior study. Patchy T2 hyperintensity in the supratentorial white matter is nonspecific but probably reflects stable chronic microvascular ischemic changes. There is no abnormal enhancement. Vascular: Major vessel flow voids at the skull base are preserved. Skull and upper cervical spine: Normal marrow signal is preserved. Sinuses/Orbits: Paranasal sinuses are aerated. Bilateral lens replacements. Other: Sella is unremarkable.  Mastoid air cells are clear. IMPRESSION: Motion degraded. No acute intracranial abnormality. Stable chronic findings detailed above. Electronically Signed   By: Macy Mis M.D.   On: 07/11/2020 18:45     Assessment/Plan: Diagnosis: Impaired mobility and ADLs following seizure 1. Does the need for close, 24 hr/day medical supervision in concert with the patient's rehab needs make it unreasonable for this patient to be served in a less intensive setting? Yes 2. Co-Morbidities requiring supervision/potential complications: depression, anxiety, seizures, TIA, cognitive difficulties, L hip fracture s/p IM nail in April. 3. Due to bladder management, bowel management, safety, skin/wound care, disease management, medication administration, pain management and patient education, does the patient require 24 hr/day rehab nursing? Yes 4. Does the patient require coordinated care of a physician, rehab nurse, therapy disciplines of PT, OT. SLP-congitive to address physical and functional deficits in the context of the above medical diagnosis(es)? Yes Addressing deficits in the following areas: balance, endurance, locomotion, strength, transferring, bowel/bladder  control, bathing, dressing, feeding, grooming, toileting, cognition and psychosocial support 5. Can the patient actively participate in an intensive therapy program of at least 3 hrs of therapy per day at least 5 days per week? Yes 6. The potential for patient to make measurable gains while on inpatient rehab is excellent 7. Anticipated functional outcomes upon discharge from inpatient rehab are modified independent  with PT, modified independent with OT, modified independent with SLP. 8. Estimated rehab length of stay to reach the above functional goals is: 10-14 days 9. Anticipated discharge destination: Home 10. Overall Rehab/Functional Prognosis: excellent  RECOMMENDATIONS: This patient's condition is appropriate for continued rehabilitative care in the following setting: CIR Patient has agreed to participate in recommended program. Yes Note that insurance prior authorization may be required for reimbursement  for recommended care.  Comment: Mrs. Keesey would be an excellent CIR candidate. I have started Melatonin 3mg  for insomnia. ACs to follow on Monday.   Izora Ribas, MD 07/13/2020

## 2020-07-13 NOTE — Progress Notes (Signed)
Inpatient Rehab Admissions:  Inpatient Rehab Consult received.  I met with patient and son, Lennette Bihari, at the bedside for rehabilitation assessment and to discuss goals and expectations of an inpatient rehab admission.  Both acknowledged understanding of goals and expectations.  Both interested in CIR prior to desired d/c back to ALF.  Will continue to monitor pt's progress and tolerance of acute therapies as well as medical workup.  Signed: Gayland Curry, [place], [place] Admissions Coordinator [phone]

## 2020-07-13 NOTE — Progress Notes (Addendum)
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   86 year old female with significant past medical history for TIA, hypothyroidism, GERD, neuropathy, anxiety and insomnia.  She reported intermittent left-sided weakness, for about 24 hours in duration, associated with apraxia, headaches, photophobia and nausea.  Her initial symptoms resolved after 20 minutes but recur about 12 hours later, prompting her to come to the hospital.  On her initial physical examination blood pressure 151/83, heart rate 91, respiratory rate 13, oxygen saturation 96%.  She had clear lungs to auscultation, heart S1-S2, present rhythmic, soft abdomen, no lower extremity edema.  Neurologically patient was nonfocal. Sodium 127, potassium 4.2, chloride 92, bicarb 26, glucose 112, BUN 9, creatinine 0.65, white count 7.1, hemoglobin 13.0, hematocrit 40.2, platelets 521.  SARS COVID-19 negative.  Urinalysis specific gravity 1.005.  Negative leukocytes.  Head and Neck CT no acute changes.  Head CT angiography with no large or medium vessel occlusion.  EKG 88 bpm, normal axis, normal intervals, sinus rhythm, PAC, poor R wave progression, no ST segment or T wave changes, positive LVH.  Electroencephalography showed evidence of epileptogenic city arising from the right frontal region, as well as cortical dysfunction in the right hemisphere. Brain MRI with chronic microvascular changes.   Patient has been placed on antiepileptic therapy with Keppra. Physical therapy and occupational therapy have recommended inpatient rehab.    Assessment & Plan:   Principal Problem:   Seizure ([place]) Active Problems:   Hypothyroidism   Hyponatremia   Slow transit constipation   HTN (hypertension), benign   Thrombocytosis ([facility])    History of TIA (transient ischemic attack)    1. 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 This am patient is more awake and alert at the time of my examination. Follows commands and responds to questions, alert and orientated x3. Tolerating po well. Continue to be very weak and deconditioned, not yet back to baseline.   Continue seizure control with Keppra, will monitor toleration to antiepileptic regimen. PT and OT have recommended CIR.  Out of bed to chair tid with meals.   2. History of CVA. No clinical signs of acute CVA, will continue with dual antiplatelet therapy with asa and clopidogrel.  Atorvastatin 20 mg daily.   3. Hyponatremia/ hypokalemia. Na has improve to 133 with K at 3,7 and serum bicarbonate at 24. Renal function stable with serum cr at 0,72.   Improved mentation and oral intake, will dc IV fluids today and will check on renal panel in am.  Add 40 kcl today.   4, HTN. Stable blood pressure, will continue monitoring, off antihypertensive medications.    6. Hypothyroid. On levothyroxine.     Status is: Inpatient  Remains inpatient appropriate because:Inpatient level of care appropriate due to severity of illness   Dispo: The patient is from: Home              Anticipated d/c is to: CIR              Anticipated d/c date is: 2 days              Patient currently is not medically stable to d/c.   DVT prophylaxis: Enoxaparin   Code Status:   dnr  Family Communication: I spoke with patient's son  at the bedside, we talked in detail about patient's condition, plan of care and prognosis and all questions were addressed.       Nutrition Status:           Skin Documentation:     Consultants:   Neurology     Subjective: Patient is more awake and alert, able to follow commands and respond to simple questions,  no nausea or vomiting, no chest pain or dyspnea. Continue to be very weak and deconditioned   Objective: Vitals:   07/12/20 2011  07/12/20 2321 07/13/20 0338 07/13/20 0806  BP: (!) 106/57 106/64 125/61 124/61  Pulse: 85 74 77 77  Resp: 18 16 18 18   Temp: 97.9 F (36.6 C) 98 F (36.7 C) 98 F (36.7 C) 97.6 F (36.4 C)  TempSrc: Oral Oral Oral Oral  SpO2: 94% 95% 96% 95%    Intake/Output Summary (Last 24 hours) at 07/13/2020 1002 Last data filed at 07/13/2020 0400 Gross per 24 hour  Intake 1085.2 ml  Output 200 ml  Net 885.2 ml   There were no vitals filed for this visit.  Examination:   General: deconditioned  Neurology: Awake and alert, continue to have generalized weakness, but more reactive today compared to yesterday E ENT: no pallor, no icterus, oral mucosa moist Cardiovascular: No JVD. S1-S2 present, rhythmic, no gallops, rubs, or murmurs. No lower extremity edema. Pulmonary: positive breath sounds bilaterally, adequate air movement, no wheezing, rhonchi or rales. Gastrointestinal. Abdomen soft and non tender Skin. No rashes Musculoskeletal: no joint deformities     Data Reviewed: I have personally reviewed following labs and imaging studies  CBC: Recent Labs  Lab 07/11/20 0914  WBC 7.1  NEUTROABS 5.5  HGB 13.2  HCT 40.3  MCV 96.0  PLT 956*   Basic Metabolic Panel: Recent Labs  Lab 07/11/20 0914 07/12/20 0729 07/13/20 0255  NA 127* 131* 133*  K 4.2 3.6 3.7  CL 92* 97* 101  CO2 26 24 24   GLUCOSE 112* 99 108*  BUN 9 11 16   CREATININE 0.65 0.67 0.72  CALCIUM 10.2 9.4 8.7*   GFR: CrCl cannot be calculated (Unknown ideal weight.). Liver Function Tests: Recent Labs  Lab 07/11/20 0914  AST 24  ALT 19  ALKPHOS 62  BILITOT 0.8  PROT 6.8  ALBUMIN 3.9   No results for input(s): LIPASE, AMYLASE in the last 168 hours. No results for input(s): AMMONIA in the last 168 hours. Coagulation Profile: Recent Labs  Lab 07/11/20 0914  INR 1.0   Cardiac Enzymes: No results for input(s): CKTOTAL, CKMB, CKMBINDEX, TROPONINI in the last 168 hours. BNP (last 3 results) No results  for input(s): PROBNP in the last 8760 hours. HbA1C: No results for input(s): HGBA1C in the last 72 hours. CBG: Recent Labs  Lab 07/11/20 0904  GLUCAP 101*   Lipid Profile: No results for input(s): CHOL, HDL, LDLCALC, TRIG, CHOLHDL, LDLDIRECT in the last 72 hours. Thyroid Function Tests: No results for input(s): TSH, T4TOTAL, FREET4, T3FREE, THYROIDAB in the last 72 hours. Anemia Panel: No results for input(s): VITAMINB12, FOLATE, FERRITIN, TIBC, IRON, RETICCTPCT in the last 72 hours.    Radiology Studies: I have reviewed all of the imaging during this hospital visit personally     Scheduled Meds: . aspirin EC  81 mg Oral Daily  . atorvastatin  20 mg Oral Daily  . clopidogrel  75 mg Oral Daily  . diclofenac Sodium  2 g Topical QHS  . enoxaparin (LOVENOX) injection  40 mg Subcutaneous Q24H  . levothyroxine  75 mcg Oral QAC breakfast  . pantoprazole  40 mg Oral Daily  . polyethylene glycol  17 g Oral Daily  . saccharomyces boulardii  250 mg Oral Daily  . senna-docusate  2 tablet Oral BID  . sodium chloride flush  3 mL Intravenous Once   Continuous Infusions: . dextrose 5 % and 0.9% NaCl 75 mL/hr at 07/13/20 0558  . levETIRAcetam 750 mg (07/12/20 2245)     LOS: 1 day        Ladarrion Telfair Gerome Apley, MD

## 2020-07-13 NOTE — Progress Notes (Addendum)
Neurology Progress Note  Patient ID: Sara Davila is a 86 y.o. with PMHx of  has a past medical history of Abdominal pain (02/26/2017), Anxiety, Arthritis, Cervical vertebral fusion, Diverticulitis, Diverticulosis, Fibromyalgia, GERD (gastroesophageal reflux disease), Gout (02/2018), Hypothyroidism, Pelvis fracture ([place]), Peritoneal free air (03/29/2012), Pneumonia, Pneumoperitoneum (02/26/2017), Thyroid disorder, TIA (transient ischemic attack), and Toe pain (09/06/2016).  Subjective: She is concerned about this being "the beginning of the end", but otherwise has no acute complaints She denies headache, nausea, vomiting, photophobia, phonophobia, or any numbness  Exam: Vitals:   07/12/20 2321 07/13/20 0338  BP: 106/64 125/61  Pulse: 74 77  Resp: 16 18  Temp: 98 F (36.7 C) 98 F (36.7 C)  SpO2: 95% 96%   Gen: In bed, NAD Resp: non-labored breathing, no acute distress Neck: No nuchal rigidity, good range of motion, able to touch her chin to her chest Psychiatric: Pleasant and cooperative Abd: soft, nt Extremities: Warm and well perfused  Neurological: Mental status: Oriented to person, Zacarias Pontes, month and year and situation (but did not know she had been here for 3 days).  No neglect to double-sided stimuli on the left, but possible very very mild difficulty looking over to the left readily.  Occasionally conversation is a bit odd/non sequitur (for example when discussing Keppra she told me that it would make her have a lot of bruising) Cranial nerves: Visual fields intact, external ocular movements intact, facial sensation symmetric bilaterally, face symmetric, tongue midline, uvula elevates symmetrically,  Motor: Does not fully supinate the left upper extremity, but there is no drift.  Somewhat less brisk raising the left lower extremity antigravity and this drifts quickly to the bed.  Briskly antigravity in the right. Sensation: Equal to light touch in all 4 extremities, no  extinction  Pertinent Labs: Hyponatremia improving to 133  I personally reviewed her MRI brain which shows chronic changes as detailed in the radiology report  Impression: Sara Davila is a 86 y.o. presenting with altered mental status likely secondary to intermittent seizures.  I do believe that her seizure is secondary to the chronic cortical changes seen on MRI brain (atrophy).  In discussion with the patient and her son, while LP would be reasonable to obtain to fully exclude an infectious/inflammatory process, given the patient's trajectory of improvement and reassuring labs and vital signs as well as physical exam, they would prefer to defer this study for today unless the patient has further events or appears to be clinically worsening.  Recommendations: -Continue Keppra 750 mg twice daily  -Discussed with the patient and family that if she becomes irritable, would trial B6 -Continue close observation today (Every 4 hourly neuro checks ordered) to confirm patient continues to improve.  Should she have further episodes of altered mental status or worsening weakness or left-sided neglect, will perform LP -Should she continue to improve today, neurologically appropriate for discharge tomorrow morning -Will need ambulatory referral to neurology for hospital follow-up on discharge (1-2 wks)  I spent a total of 30 minutes in direct care with patient and family member, answering their questions as well as speaking with Dr. Cathlean Sauer about this case.  Lesleigh Noe MD-PhD [facility] [phone]

## 2020-07-13 NOTE — Evaluation (Signed)
Speech Language Pathology Evaluation Patient Details Name: Sara Davila MRN: [id number] DOB: 1930-07-02 Today's Date: 07/13/2020 Time: 5427-0623 SLP Time Calculation (min) (ACUTE ONLY): 45 min  Problem List:  Patient Active Problem List   Diagnosis Date Noted  . Seizure ([place]) 07/12/2020  . Left-sided weakness 07/11/2020  . History of TIA (transient ischemic attack) 07/11/2020  . TIA (transient ischemic attack) 06/20/2020  . CVA (cerebral vascular accident) ([place]) 06/19/2020  . Asymptomatic bacteriuria 06/19/2020  . HTN (hypertension), benign 06/19/2020  . Thrombocytosis ([place]) 06/19/2020  . Left hip pain 04/15/2020  . Left knee pain 03/20/2020  . Slow transit constipation 03/20/2020  . Left foot pain 03/18/2020  . Urinary retention 03/06/2020  . Blood loss anemia 03/06/2020  . Displaced intertrochanteric fracture of left femur, initial encounter for closed fracture ([place]) 03/03/2020  . Prediabetes 01/04/2020  . Memory deficit 01/04/2020  . Osteoporosis 01/04/2020  . History of vasculitis 01/04/2020  . Venous insufficiency 01/04/2020  . Neuropathy 03/30/2018  . Hyponatremia 07/21/2017  . Insomnia secondary to anxiety 07/21/2017  . GERD (gastroesophageal reflux disease) 07/21/2017  . S/P small bowel resection 07/09/2017  . Hypothyroidism 02/26/2017  . Paresthesias 09/06/2016  . Fall 03/29/2012   Past Medical History:  Past Medical History:  Diagnosis Date  . Abdominal pain 02/26/2017  . Anxiety   . Arthritis    Left Knee, Wrist, Back and Hips  . Cervical vertebral fusion   . Diverticulitis   . Diverticulosis   . Fibromyalgia   . GERD (gastroesophageal reflux disease)   . Gout 02/2018   L hand  . Hypothyroidism   . Pelvis fracture ([place])   . Peritoneal free air 03/29/2012  . Pneumonia   . Pneumoperitoneum 02/26/2017  . Thyroid disorder   . TIA (transient ischemic attack)   . Toe pain 09/06/2016   Past Surgical History:  Past Surgical History:  Procedure  Laterality Date  . ABDOMINAL HYSTERECTOMY    . BOWEL RESECTION N/A 07/09/2017   Procedure: SMALL BOWEL RESECTION;  Surgeon: Johnathan Hausen, MD;  Location: [facility];  Service: General;  Laterality: N/A;  . CERVICAL FUSION     x2  . FEMUR IM NAIL Left 03/03/2020   Procedure: INTRAMEDULLARY (IM) NAIL FEMORAL;  Surgeon: Paralee Cancel, MD;  Location: [facility];  Service: Orthopedics;  Laterality: Left;  . LAPAROSCOPIC APPENDECTOMY N/A 03/04/2017   Procedure: APPENDECTOMY LAPAROSCOPIC;  Surgeon: Johnathan Hausen, MD;  Location: [facility];  Service: General;  Laterality: N/A;  . LAPAROSCOPY N/A 03/04/2017   Procedure: LAPAROSCOPY DIAGNOSTIC, ENTEROLYSIS;  Surgeon: Johnathan Hausen, MD;  Location: [facility];  Service: General;  Laterality: N/A;  . LAPAROTOMY N/A 07/09/2017   Procedure: EXPLORATORY LAPAROTOMY;  Surgeon: Johnathan Hausen, MD;  Location: [facility];  Service: General;  Laterality: N/A;  . SPINE SURGERY     Lumbar- rod placement  . TONSILLECTOMY     86y/o  . TOTAL VAGINAL HYSTERECTOMY     HPI:  Pt is a 86 y/o female with PMH of TIA, hypothyroidism, neuropathy, anxiety, pneumonia, fibromyalgia, L femur fx s/p IM nail on 03/03/20, and insomnia who presented with complaints of L sided weakness. CT and MRI negative for acute abnormalities. 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   Assessment / Plan / Recommendation Clinical Impression  Pt was seen for a cognitive-linguistic evaluation.  She was encountered awake/alert with son present at bedside.  She stated that she was tired, but she was agreeable to this evaluation.  Pt reported that she has had some difficulty with short-term memory for the past 30 years.  She stated that she has been living in assisted living and that the care staff manages her medications, but that she currently  manages all of her finances.  Son verified this information.  Pt completed the [place] Examination in addition to informal evaluation measures.  She scored overall 14/30 on the SLUMS, indicating a moderate cognitive impairment.  Pt exhibited the most difficulty in the areas of numeric problem solving, executive functioning, short-term memory, and attention.  Pt was unable to complete the clock drawing independently, and required mod/max cues to place numbers in their correct positions on a pre-drawn clock.  She was additionally unable to answer numeric problem solving questions involving money and time management.  Expressive language appeared to be grossly functional.  Pt followed 1 & 2 step commands without difficulty, but consistently had difficulty following 3 step commands.  Suspect that this was more related to working memory deficits vs receptive language.  Pt also appeared to have mildly delayed auditory processing per informal evaluation.  No dysarthria was observed.  Recommend additional ST targeting cognitive-linguistic deficits in CIR.  Pt will also benefit from full assistance with IADLs including medication and financial management at time of discharge.  SLP discussed results and recommendations with pt/son and they verbalized understanding.  SLP will f/u for tx targeting cognitive-linguistic deficits acutely.      SLP Assessment  SLP Recommendation/Assessment: Patient needs continued Speech Lanaguage Pathology Services SLP Visit Diagnosis: Cognitive communication deficit (R41.841)    Follow Up Recommendations  Inpatient Rehab    Frequency and Duration min 2x/week  2 weeks      SLP Evaluation Cognition  Overall Cognitive Status: Impaired/Different from baseline Arousal/Alertness: Awake/alert Orientation Level: Oriented X4 Attention: Sustained Sustained Attention: Impaired Sustained Attention Impairment: Verbal basic Memory: Impaired Memory Impairment: Decreased short term  memory Decreased Short Term Memory: Verbal basic;Functional basic Awareness: Appears intact Problem Solving: Impaired Problem Solving Impairment: Verbal basic;Functional basic;Functional complex;Verbal complex Executive Function: Writer: Impaired Organizing Impairment: Functional basic Behaviors: Perseveration Safety/Judgment: Appears intact       Comprehension  Auditory Comprehension Overall Auditory Comprehension: Appears within functional limits for tasks assessed Yes/No Questions: Within Functional Limits Commands: Impaired Two Step Basic Commands: 75-100% accurate Multistep Basic Commands: 25-49% accurate Conversation: Complex Reading Comprehension Reading Status: Not tested    Expression Expression Primary Mode of Expression: Verbal Verbal Expression Overall Verbal Expression: Appears within functional limits for tasks assessed Repetition: Impaired Level of Impairment: Sentence level Naming: No impairment Pragmatics: No impairment Written Expression Dominant Hand: Left Written Expression: Not tested   Oral / Motor  Oral Motor/Sensory Function Overall Oral Motor/Sensory Function: Within functional limits Motor Speech Overall Motor Speech: Appears within functional limits for tasks assessed   GO                   Colin Mulders., M.S., [facility] Office: [phone]  [place] 07/13/2020, 2:03 PM

## 2020-07-14 DIAGNOSIS — E039 Hypothyroidism, unspecified: Secondary | ICD-10-CM

## 2020-07-14 LAB — BASIC METABOLIC PANEL
Anion gap: 9 (ref 5–15)
BUN: 12 mg/dL (ref 8–23)
CO2: 23 mmol/L (ref 22–32)
Calcium: 9.1 mg/dL (ref 8.9–10.3)
Chloride: 102 mmol/L (ref 98–111)
Creatinine, Ser: 0.65 mg/dL (ref 0.44–1.00)
GFR calc Af Amer: >60 mL/min (ref >60)
GFR calc non Af Amer: >60 mL/min (ref >60)
Glucose, Bld: 108 mg/dL — ABNORMAL HIGH (ref 70–99)
Potassium: 4 mmol/L (ref 3.5–5.1)
Sodium: 134 mmol/L — ABNORMAL LOW (ref 135–145)

## 2020-07-14 MED ORDER — LEVETIRACETAM 750 MG PO TABS
750.0000 mg | ORAL_TABLET | Freq: Two times a day (BID) | ORAL | Status: DC
  Administered 2020-07-14 – 2020-07-18 (×9): 750 mg via ORAL
  Filled 2020-07-14 (×9): qty 1

## 2020-07-14 NOTE — Progress Notes (Signed)
PROGRESS NOTE    Sara Davila  LEX:[id number] DOB: [birth year] DOA: 07/11/2020 PCP: [name], MD    Brief Narrative:  Patient admitted to the hospital with working diagnosis of focal neurologic deficit duenew onset seizures in the setting of old CVA.  86 year old female with significant past medical history for TIA, hypothyroidism, GERD, neuropathy, anxiety and insomnia. She reported intermittent left-sided weakness, for about 24 hours in duration, associated with apraxia,headaches, photophobia and nausea. Her initial symptoms resolved after 20 minutes but recur about 12 hours later, prompting her to come to the hospital. On her initial physical examination blood pressure 151/83, heart rate 91, respiratory rate 13, oxygen saturation 96%. She had clear lungs to auscultation, heart S1-S2, present rhythmic, soft abdomen, no lower extremity edema. Neurologically patient was nonfocal. Sodium 127, potassium 4.2, chloride 92, bicarb 26, glucose 112, BUN 9, creatinine 0.65, white count 7.1, hemoglobin 13.0, hematocrit 40.2,platelets 521.SARS COVID-19 negative. Urinalysis specific gravity 1.005. Negative leukocytes. Head and NeckCT no acute changes. Head CT angiography with no large or medium vessel occlusion. EKG 88 bpm, normal axis, normal intervals, sinus rhythm, PAC, poor R wave progression, no ST segment or T wave changes, positive LVH.  Electroencephalography showed evidence of epileptogenic activity arising from the right frontal region,as well as cortical dysfunction in the right hemisphere. Brain MRI with chronic microvascular changes.  Patient has been placed on antiepileptic therapy with Keppra. Physical therapy and occupational therapy have recommended inpatient rehab.     Assessment & Plan:   Principal Problem:   Seizure ([place]) Active Problems:   Hypothyroidism   Hyponatremia   Slow transit constipation   HTN (hypertension), benign   Thrombocytosis  ([facility])   History of TIA (transient ischemic attack)   1. 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 Mentation continue to improve, no further seizures.   Tolerating well antiepileptic regimen with Keppra (will change to po formulation). Patient continue to be very weak and deconditioned, plan for CIR, within the next 24 H. Continue to encourage out of bed to chair tid with meals.    2. History of CVA. No acute CVA. On dual antiplatelet therapy with asa and clopidogrel.  Continue with Atorvastatin 20 mg daily.   3. Hyponatremia/ hypokalemia. Renal function with serum cr at 0,65, K up to 4,0 and serum bicarbonate at 23.  Patient is tolerating po well. Continue to hold on IV fluids.   4, HTN. Blood pressure 150/73. [atient off antihypertensive medications.   6. Hypothyroid. Continue with levothyroxine.  7. Anxiety/ insomnia. Continue as needed lorazepam. On melatonin and ambien.   Status is: Inpatient  Remains inpatient appropriate because:Unsafe d/c plan   Dispo: The patient is from: Home              Anticipated d/c is to: CIR              Anticipated d/c date is: 1 day              Patient currently is medically stable to d/c.   DVT prophylaxis: Enoxaparin   Code Status:   dnr   Family Communication:  No family at the bedside      Consultants:   Neurology    Subjective: Patient continue to be very weak and deconditioned, no nausea or vomiting, no chest pain, has chronic right lower extremity edema, since right femur fracture.   Objective: Vitals:   07/13/20 1936 07/13/20 2355 07/14/20 0344 07/14/20 0900  BP: 123/69 129/75 138/63 Marland Kitchen)  150/73  Pulse: 90 86 82 84  Resp: 18 18 16 16   Temp: 97.9 F (36.6 C) 98 F (36.7 C) 98 F (36.7 C) 98.5 F (36.9 C)  TempSrc: Oral Oral Oral Oral  SpO2: 98% 99% 96% 96%    Intake/Output Summary (Last 24 hours) at 07/14/2020 0908 Last data filed at 07/14/2020 0513 Gross per 24 hour  Intake 876 ml  Output 800 ml  Net 76  ml   There were no vitals filed for this visit.  Examination:   General: deconditioned and ill looking appearing  Neurology: Awake and alert, non focal  E ENT: positive pallor, no icterus, oral mucosa moist Cardiovascular: No JVD. S1-S2 present, rhythmic, no gallops, rubs, or murmurs. Positive right non pitting lower extremity edema. Pulmonary: positive breath sounds bilaterally, Gastrointestinal. Abdomen soft and non tender Skin. No rashes Musculoskeletal: no joint deformities     Data Reviewed: I have personally reviewed following labs and imaging studies  CBC: Recent Labs  Lab 07/11/20 0914  WBC 7.1  NEUTROABS 5.5  HGB 13.2  HCT 40.3  MCV 96.0  PLT 683*   Basic Metabolic Panel: Recent Labs  Lab 07/11/20 0914 07/12/20 0729 07/13/20 0255 07/14/20 0307  NA 127* 131* 133* 134*  K 4.2 3.6 3.7 4.0  CL 92* 97* 101 102  CO2 26 24 24 23   GLUCOSE 112* 99 108* 108*  BUN 9 11 16 12   CREATININE 0.65 0.67 0.72 0.65  CALCIUM 10.2 9.4 8.7* 9.1   GFR: CrCl cannot be calculated (Unknown ideal weight.). Liver Function Tests: Recent Labs  Lab 07/11/20 0914  AST 24  ALT 19  ALKPHOS 62  BILITOT 0.8  PROT 6.8  ALBUMIN 3.9   No results for input(s): LIPASE, AMYLASE in the last 168 hours. No results for input(s): AMMONIA in the last 168 hours. Coagulation Profile: Recent Labs  Lab 07/11/20 0914  INR 1.0   Cardiac Enzymes: No results for input(s): CKTOTAL, CKMB, CKMBINDEX, TROPONINI in the last 168 hours. BNP (last 3 results) No results for input(s): PROBNP in the last 8760 hours. HbA1C: No results for input(s): HGBA1C in the last 72 hours. CBG: Recent Labs  Lab 07/11/20 0904  GLUCAP 101*   Lipid Profile: No results for input(s): CHOL, HDL, LDLCALC, TRIG, CHOLHDL, LDLDIRECT in the last 72 hours. Thyroid Function Tests: No results for input(s): TSH, T4TOTAL, FREET4, T3FREE, THYROIDAB in the last 72 hours. Anemia Panel: No results for input(s): VITAMINB12,  FOLATE, FERRITIN, TIBC, IRON, RETICCTPCT in the last 72 hours.    Radiology Studies: I have reviewed all of the imaging during this hospital visit personally     Scheduled Meds: . aspirin EC  81 mg Oral Daily  . atorvastatin  20 mg Oral Daily  . clopidogrel  75 mg Oral Daily  . diclofenac Sodium  2 g Topical QHS  . enoxaparin (LOVENOX) injection  40 mg Subcutaneous Q24H  . levothyroxine  75 mcg Oral QAC breakfast  . melatonin  3 mg Oral QHS  . pantoprazole  40 mg Oral Daily  . polyethylene glycol  17 g Oral Daily  . saccharomyces boulardii  250 mg Oral Daily  . senna-docusate  2 tablet Oral BID  . sodium chloride flush  3 mL Intravenous Once   Continuous Infusions: . levETIRAcetam Stopped (07/13/20 2116)     LOS: 2 days        Jaythan Hinely Gerome Apley, MD

## 2020-07-14 NOTE — Progress Notes (Addendum)
Neurology Progress Note  Patient ID: MCCARTNEY BRUCKS is a 86 y.o. with PMHx of  has a past medical history of Abdominal pain (02/26/2017), Anxiety, Arthritis, Cervical vertebral fusion, Diverticulitis, Diverticulosis, Fibromyalgia, GERD (gastroesophageal reflux disease), Gout (02/2018), Hypothyroidism, Pelvis fracture ([place]), Peritoneal free air (03/29/2012), Pneumonia, Pneumoperitoneum (02/26/2017), Thyroid disorder, TIA (transient ischemic attack), and Toe pain (09/06/2016).  Subjective: She is more cheerful today, and feeling ready for rehab She denies headache, nausea, vomiting, photophobia, phonophobia, or any numbness  Exam: Vitals:   07/14/20 0900 07/14/20 1235  BP: (!) 150/73 (!) 144/77  Pulse: 84 92  Resp: 16 18  Temp: 98.5 F (36.9 C) 98.2 F (36.8 C)  SpO2: 96% 98%   Gen: In bed, NAD Resp: non-labored breathing, no acute distress Neck: No nuchal rigidity Psychiatric: Pleasant and cooperative Abd: soft, nt Extremities: Warm and well perfused  Neurological: Mental status: Oriented to person, Zacarias Pontes, month and year and situation (knows she has been here for 3 nights so far and had some seizures).  I detected no evidence of neglect today.   Cranial nerves: Visual fields intact, external ocular movements intact, facial sensation symmetric bilaterally, face symmetric, tongue midline, uvula elevates symmetrically,  Motor: Fully supinates the left upper extremity, with some intermittent very mild pronation without drift.  Somewhat less brisk raising the left lower extremity antigravity and this drifts quickly to the bed, which she reports may be chronic due to a femur fracture.  Briskly antigravity in the right. Sensation: Equal to light touch in all 4 extremities, no extinction  Pertinent Labs: Hyponatremia improving to 134  Impression: REHANNA OLOUGHLIN is a 86 y.o. presenting with altered mental status likely secondary to intermittent seizures.  I do believe that her seizure is  secondary to the chronic cortical changes seen on MRI brain (atrophy).  As previously documented, in discussion with the patient and her son, while LP would be reasonable to obtain to fully exclude an infectious/inflammatory process, given the patient's trajectory of improvement and reassuring labs and vital signs as well as physical exam, they would prefer to defer this study for today unless the patient has further events or appears to be clinically worsening.   Given she has continued to steadily improve, no further inpatient work-up is required.  Recommendations: -Continue Keppra 750 mg twice daily  -Discussed with the patient and family that if she becomes irritable, would trial B6 -Should she have further episodes of altered mental status or worsening weakness or left-sided neglect, while in patient or in rehab, please re-consult neurology for LP -Strict return precautions for any subsequent seizures or alterations in mental status -Will need ambulatory referral to neurology for hospital follow-up on discharge from rehab (1-2 wks)  [place] [phone]

## 2020-07-15 MED ORDER — MELATONIN 3 MG PO TABS: 3.0000 mg | ORAL_TABLET | Freq: Every day | ORAL | 0 refills | Status: AC

## 2020-07-15 MED ORDER — CLOPIDOGREL BISULFATE 75 MG PO TABS: 75.0000 mg | ORAL_TABLET | Freq: Every day | ORAL | 0 refills | Status: DC

## 2020-07-15 MED ORDER — GUAIFENESIN 100 MG/5ML PO SOLN
5.0000 mL | ORAL | Status: DC | PRN
  Administered 2020-07-15 – 2020-07-17 (×6): 100 mg via ORAL
  Filled 2020-07-15 (×6): qty 5

## 2020-07-15 MED ORDER — LEVETIRACETAM 750 MG PO TABS: 750.0000 mg | ORAL_TABLET | Freq: Two times a day (BID) | ORAL | 0 refills | Status: DC

## 2020-07-15 NOTE — Progress Notes (Signed)
Physical Therapy Treatment Patient Details Name: Sara Davila MRN: [id number] DOB: [birth year] Today's Date: 07/15/2020    History of Present Illness Pt is a 86 y/o female with PMH of TIA, hypothyroidism, neuropathy, anxiety, pneumonia, fibromyalgia, L femur fx s/p IM nail on 03/03/20, and insomnia who presented with complaints of L sided weakness. CT and MRI negative for acute abnormalities. 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     PT Comments    Pt progressing towards physical therapy goals. Was able to demonstrate improved ambulation this session and progressed to HHA instead of RW use. Chair follow utilized and pt required seated rest break mainly due to DOE (2/4).  Pt reports sore spot on her sacrum and RN notified - she addressed this during the session and applied a sacral foam prior to return to supine. This patient could benefit from continued multidisciplinary therapies available at [facility] to maximize functional independence, safety, and return to PLOF.    Follow Up Recommendations  [facility]     Equipment Recommendations  None recommended by PT    Recommendations for Other Services Rehab consult     Precautions / Restrictions Precautions Precautions: Fall Restrictions Weight Bearing Restrictions: No    Mobility  Bed Mobility Overal bed mobility: Needs Assistance Bed Mobility: Sit to Supine       Sit to supine: Supervision   General bed mobility comments: No assist required to transition back to supine at end of session.   Transfers Overall transfer level: Needs assistance Equipment used: Rolling walker (2 wheeled);1 person hand held assist Transfers: Sit to/from Stand Sit to Stand: Min guard         General transfer comment: Close guard provided as pt powered up to full standing  position. Initially with walker and without AD at end of session.   Ambulation/Gait Ambulation/Gait assistance: Min guard;Min assist Gait Distance (Feet): 250 Feet Assistive device: Rolling walker (2 wheeled);1 person hand held assist Gait Pattern/deviations: Step-through pattern;Decreased stride length;Drifts right/left;Decreased weight shift to left Gait velocity: decreased Gait velocity interpretation: <1.31 ft/sec, indicative of household ambulator General Gait Details: Initially with RW and then progressed to [facility] as pt dissatisfied with the RW we provided her. She states the walker was making her drift however pt drifting to the L with HHA as well.    Stairs             Wheelchair Mobility    Modified Rankin (Stroke Patients Only) Modified Rankin (Stroke Patients Only) Pre-Morbid Rankin Score: Moderate disability Modified Rankin: Moderately severe disability     Balance Overall balance assessment: Needs assistance Sitting-balance support: Feet supported Sitting balance-Leahy Scale: Fair     Standing balance support: Bilateral upper extremity supported;During functional activity Standing balance-Leahy Scale: Poor Standing balance comment: relies on external and UE suport                            Cognition Arousal/Alertness: Awake/alert Behavior During Therapy: Flat affect Overall Cognitive Status: Impaired/Different from baseline Area of Impairment: Memory;Safety/judgement;Awareness;Orientation                 Orientation Level: Disoriented to;Situation Current Attention Level: Focused Memory: Decreased recall of precautions;Decreased short-term memory Following Commands: Follows one step commands inconsistently;Follows one step commands with increased time Safety/Judgement: Decreased awareness of safety;Decreased awareness of deficits Awareness:  Intellectual Problem Solving: Slow processing;Requires verbal cues;Requires tactile  cues General Comments: Asking if she is at [facility]. Pleasantly confused throughout session. Asking why there's no toilet in this hospital and states she does not trust the staff several times. Explained that we were using the bedside commode and Purewick since the patient is not able to walk to the bathroom right now.       Exercises      General Comments        Pertinent Vitals/Pain Pain Assessment: No/denies pain    Home Living                      Prior Function            PT Goals (current goals can now be found in the care plan section) Acute Rehab PT Goals Patient Stated Goal: None stated PT Goal Formulation: With patient Time For Goal Achievement: 07/26/20 Potential to Achieve Goals: Good Progress towards PT goals: Progressing toward goals    Frequency    Min 3X/week      PT Plan Current plan remains appropriate    Co-evaluation              AM-PAC PT "6 Clicks" Mobility   Outcome Measure  Help needed turning from your back to your side while in a flat bed without using bedrails?: A Little Help needed moving from lying on your back to sitting on the side of a flat bed without using bedrails?: A Lot Help needed moving to and from a bed to a chair (including a wheelchair)?: A Lot Help needed standing up from a chair using your arms (e.g., wheelchair or bedside chair)?: A Lot Help needed to walk in hospital room?: Total Help needed climbing 3-5 steps with a railing? : Total 6 Click Score: 11    End of Session Equipment Utilized During Treatment: Gait belt Activity Tolerance: Patient tolerated treatment well Patient left: in bed;with call bell/phone within reach;with chair alarm set;with family/visitor present Nurse Communication: Mobility status PT Visit Diagnosis: Other abnormalities of gait and mobility (R26.89)     Time: 1311-1340 PT Time Calculation (min) (ACUTE ONLY): 29 min  Charges:  $Gait Training: 23-37 mins                      Sara Davila, PT, DPT Acute Rehabilitation Services Pager: [phone] Office: [phone]    Sara Davila 07/15/2020, 2:35 PM

## 2020-07-15 NOTE — Discharge Summary (Signed)
Physician Discharge Summary  Sara Davila WGY:[id number] DOB: 09-14-1930 DOA: 07/11/2020  PCP: [name], MD  Admit date: 07/11/2020 Discharge date: 07/15/2020  Admitted From: Home  Disposition:  CIR  Recommendations for Outpatient Follow-up and new medication changes:  1. Follow up with Dr. Lyndel Safe in 7 days.  2. Patient has been placed on Keppra 750 mg bid.  3. Follow up with neurology as outpatient.  4. Plan of trial of B6 in case patient becomes irritable. 5. If recurrent focal neurologic focal deficit reconsult neurology.  6. Continue with seizure precautions.   Home Health: na   Equipment/Devices:  Walker   Discharge Condition: stable  CODE STATUS: dnr   Diet recommendation: heart healthy   Brief/Interim Summary: Patient admitted to the hospital with working diagnosis of acute focal neurologic deficit duenew onset seizures in the setting of old CVA.  86 year old female with significant past medical history for TIA, hypothyroidism, GERD, neuropathy, anxiety and insomnia. She reported intermittent left-sided weakness, for about 24 hours in duration, associated with apraxia,headaches, photophobia and nausea. Her initial symptoms resolved after 20 minutes but recur about 12 hours later, prompting her to come to the hospital. On her initial physical examination blood pressure 151/83, heart rate 91, respiratory rate 13, oxygen saturation 96%. She had clear lungs to auscultation, heart S1-S2, present rhythmic, soft abdomen, no lower extremity edema. Neurologically patient was nonfocal. Sodium 127, potassium 4.2, chloride 92, bicarb 26, glucose 112, BUN 9, creatinine 0.65, white count 7.1, hemoglobin 13.0, hematocrit 40.2,platelets 521.SARS COVID-19 negative. Urinalysis specific gravity 1.005. Negative leukocytes. Head and NeckCT no acute changes. Head CT angiography with no large or medium vessel occlusion. EKG 88 bpm, normal axis, normal intervals, sinus rhythm,  PAC, poor R wave progression, no ST segment or T wave changes, positive LVH.  Electroencephalography showed evidence of epileptogenic activity arising from the right frontal region,as well as cortical dysfunction in the right hemisphere. Brain MRI with chronic microvascular changes.  Patient has been placed on antiepileptic therapy with Keppra. Physical therapy and occupational therapy have recommended inpatient rehab.  1. 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  Patient was admitted to the medical ward, she had frequent neuro checks and seizure precautions. Patient was evaluated by neurology and she was placed on antiepileptic regimen with Keppra 750 g twice daily. She had no further clinical evidence of seizure activity.  Patient was evaluated by physical therapy and occupational therapy. Recommendations to continue recovery at inpatient rehab.  Follow-up with neurology as an outpatient.  2.  History of CVA.  No acute CVA, patient will continue dual antiplatelet therapy with aspirin and clopidogrel. Continue atorvastatin.  3.  Hypovolemic hyponatremia/hypokalemia.  Patient received isotonic intravenous fluids with good toleration, potassium was corrected with potassium chloride. At discharge she is tolerating p.o. diet adequately, her sodium is 134, potassium 4.0, chloride 102, bicarb 23, glucose 108, BUN 12, creatinine 0.65.  4.  Hypertension her blood pressure remained well controlled off antihypertensive medications. At discharge patient will resume low dose of amlodipine.   5.  Hypothyroidism.  Continue levothyroxine.  6.  Anxiety/insomnia.  Continuously lorazepam, continue melatonin.  7.  Chronic thrombocytosis.  Her platelets on admission were elevated at 521, recommendations to follow-up as an outpatient.   Discharge Diagnoses:  Principal Problem:   Seizure [facility] Active Problems:   Hypothyroidism   Hyponatremia   Slow transit constipation   HTN  (hypertension), benign   Thrombocytosis (HCC)   History of TIA (transient ischemic  attack)    Discharge Instructions   Allergies as of 07/15/2020      Reactions   Codeine Rash   Penicillins Rash   Has patient had a PCN reaction causing immediate rash, facial/tongue/throat swelling, SOB or lightheadedness with hypotension: No Has patient had a PCN reaction causing severe rash involving mucus membranes or skin necrosis: No Has patient had a PCN reaction that required hospitalization: No Has patient had a PCN reaction occurring within the last 10 years: No If all of the above answers are "NO", then may proceed with Cephalosporin use.   Levaquin [levofloxacin]       Medication List    TAKE these medications   amLODipine 2.5 MG tablet Commonly known as: NORVASC Take 1 tablet (2.5 mg total) by mouth daily.   aspirin 81 MG EC tablet Take 1 tablet (81 mg total) by mouth daily. Swallow whole.   atorvastatin 20 MG tablet Commonly known as: LIPITOR Take 1 tablet (20 mg total) by mouth daily.   CALCIUM 500 PO Take 1 tablet by mouth daily.   clopidogrel 75 MG tablet Commonly known as: PLAVIX Take 1 tablet (75 mg total) by mouth daily for 21 days.   DentaGel 1.1 % Gel dental gel Generic drug: sodium fluoride Place 1 application onto teeth daily.   diclofenac Sodium 1 % Gel Commonly known as: VOLTAREN Apply topically in the morning and at bedtime. To knee   docusate sodium 100 MG capsule Commonly known as: COLACE Take 100 mg by mouth daily as needed for mild constipation.   ibandronate 150 MG tablet Commonly known as: BONIVA Take 150 mg by mouth every 30 (thirty) days. Take in the morning with a full glass of water, on an empty stomach, and do not take anything else by mouth or lie down for the next 30 min.   ibuprofen 200 MG tablet Commonly known as: ADVIL Take 200 mg by mouth daily as needed for headache.   levETIRAcetam 750 MG tablet Commonly known as: KEPPRA Take 1  tablet (750 mg total) by mouth 2 (two) times daily.   levothyroxine 75 MCG tablet Commonly known as: SYNTHROID Take 1 tablet (75 mcg total) by mouth daily before breakfast.   LORazepam 0.5 MG tablet Commonly known as: Ativan Take 0.5 tablets (0.25 mg total) by mouth 2 (two) times daily as needed for anxiety.   melatonin 3 MG Tabs tablet Take 1 tablet (3 mg total) by mouth at bedtime for 15 days.   mupirocin ointment 2 % Commonly known as: BACTROBAN Apply 1 application topically daily as needed (nose sores).   omeprazole 20 MG capsule Commonly known as: PRILOSEC Take 20 mg by mouth daily as needed (indigestion.).   polyethylene glycol 17 g packet Commonly known as: MIRALAX / GLYCOLAX Take 17 g by mouth daily.   Prevagen 10 MG Caps Generic drug: Apoaequorin Take 10 mg by mouth daily.   saccharomyces boulardii 250 MG capsule Commonly known as: FLORASTOR Take 250 mg by mouth daily.   senna-docusate 8.6-50 MG tablet Commonly known as: Senokot-S Take 2 tablets by mouth 2 (two) times daily.   Theratears PF 0.25 % Soln Generic drug: Carboxymethylcellulose Sod PF Apply 1 drop to eye in the morning and at bedtime.   traMADol 50 MG tablet Commonly known as: ULTRAM Take 50 mg by mouth every 6 (six) hours as needed for moderate pain.   TYLENOL EXTRA STRENGTH PO Take 500 mg by mouth in the morning and at bedtime.   Vitamin  D (Cholecalciferol) 50 MCG (2000 UT) Caps Take 1 tablet by mouth daily.   zolpidem 5 MG tablet Commonly known as: AMBIEN Take 0.5 tablets (2.5 mg total) by mouth at bedtime as needed for sleep. What changed: when to take this       Contact information for after-discharge care    Destination    [facility] .   Service: Skilled Nursing Contact information: [facility] [employer] [place] [phone]                 Allergies  Allergen Reactions  . Codeine Rash  . Penicillins Rash    Has  patient had a PCN reaction causing immediate rash, facial/tongue/throat swelling, SOB or lightheadedness with hypotension: No Has patient had a PCN reaction causing severe rash involving mucus membranes or skin necrosis: No Has patient had a PCN reaction that required hospitalization: No Has patient had a PCN reaction occurring within the last 10 years: No If all of the above answers are "NO", then may proceed with Cephalosporin use.   [place] [Levofloxacin]     Consultations:  Neurology    Procedures/Studies: EEG  Result Date: 07/11/2020 Lora Havens, MD     07/11/2020  4:21 PM Patient Name: Sara Davila MRN: [id number] Epilepsy Attending: Lora Havens Referring Physician/Provider: Dr. Varney Baas Date: 07/11/2020 Duration: 24 mins Patient history: 86 year old female who presented with left-sided weakness and left hemianopia.  EEG evaluate for seizures. Level of alertness: Awake AEDs during EEG study: None Technical aspects: This EEG study was done with scalp electrodes positioned according to the 10-20 International system of electrode placement. Electrical activity was acquired at a sampling rate of 500Hz  and reviewed with a high frequency filter of 70Hz  and a low frequency filter of 1Hz . EEG data were recorded continuously and digitally stored. Description: No posterior dominant rhythm was seen. EEG showed 8-9 z alpha activity in left hemisphere and continuous 3-5hz  theta-delta activity in right hemisphere which at times appear rhythmic and sharply contoured without definite evolution. Sharp waves were also seen in right frontal region.  Hyperventilation and photic stimulation were not performed.   ABNORMALITY - Sharp wave, right frontal region -Continuous slow, right hemisphere IMPRESSION: This study showed evidence of epileptogenicity arising from right frontal region as well as cortical dysfunction in right hemisphere likely secondary to underlying structural abnormality,  post-ictal state. Lora Havens   CT Code Stroke CTA Head W/WO contrast  Result Date: 07/11/2020 CLINICAL DATA:  Headache. Blurred vision. Left upper extremity weakness. EXAM: CT ANGIOGRAPHY HEAD AND NECK CT PERFUSION BRAIN TECHNIQUE: Multidetector CT imaging of the head and neck was performed using the standard protocol during bolus administration of intravenous contrast. Multiplanar CT image reconstructions and MIPs were obtained to evaluate the vascular anatomy. Carotid stenosis measurements (when applicable) are obtained utilizing NASCET criteria, using the distal internal carotid diameter as the denominator. Multiphase CT imaging of the brain was performed following IV bolus contrast injection. Subsequent parametric perfusion maps were calculated using RAPID software. CONTRAST:  110mL OMNIPAQUE IOHEXOL 350 MG/ML SOLN COMPARISON:  Head CT earlier same day. FINDINGS: CTA NECK FINDINGS Aortic arch: Aortic atherosclerosis and tortuosity. No aneurysm or dissection. Branching pattern is normal without origin stenosis. Right carotid system: Common carotid artery widely patent to the bifurcation. Carotid bifurcation is widely patent without soft or calcified plaque. Cervical ICA widely patent. Left carotid system: Common carotid artery widely patent to the bifurcation. Minimal calcified plaque at the  carotid bifurcation but no stenosis. Cervical ICA widely patent. Vertebral arteries: Both vertebral artery origins are widely patent. Both vertebral arteries appeared widely patent and normal through the cervical region to the foramen magnum. Skeleton: Distant cervical fusion. Chronic degenerative anterolisthesis at C7-T1. Old minor superior endplate deformity at T6. Other neck: No mass or lymphadenopathy. Upper chest:  Scarring at the lung apices. No active process. Review of the MIP images confirms the above findings CTA HEAD FINDINGS Anterior circulation: Both internal carotid arteries are widely patent through  the skull base and siphon regions. No siphon stenosis. The anterior and middle cerebral vessels are normal without proximal stenosis, aneurysm or vascular malformation. No large or medium vessel occlusion. Posterior circulation: Both vertebral arteries are widely patent to the basilar. No basilar stenosis. Posterior circulation branch vessels appear normal. Venous sinuses: Patent and normal. Anatomic variants: None Review of the MIP images confirms the above findings CT Brain Perfusion Findings: ASPECTS: 10 CBF (<30%) Volume: 58mL Perfusion (Tmax>6.0s) volume: 49mL Mismatch Volume: 62mL Infarction Location:None IMPRESSION: 1. No large or medium vessel occlusion. Normal perfusion study. 2. Aortic atherosclerosis. 3. Minimal calcified plaque at the left carotid bifurcation but no stenosis. Aortic Atherosclerosis (ICD10-I70.0). Electronically Signed   By: Nelson Chimes M.D.   On: 07/11/2020 12:30   CT Angio Head W or Wo Contrast  Result Date: 06/19/2020 CLINICAL DATA:  Extremity weakness.  Stroke suspected. EXAM: CT ANGIOGRAPHY HEAD AND NECK TECHNIQUE: Multidetector CT imaging of the head and neck was performed using the standard protocol during bolus administration of intravenous contrast. Multiplanar CT image reconstructions and MIPs were obtained to evaluate the vascular anatomy. Carotid stenosis measurements (when applicable) are obtained utilizing NASCET criteria, using the distal internal carotid diameter as the denominator. CONTRAST:  6mL OMNIPAQUE IOHEXOL 350 MG/ML SOLN COMPARISON:  CT head without contrast 06/19/2020 and 03/02/2020. FINDINGS: CTA NECK FINDINGS Aortic arch: Common origin of the left common carotid artery and innominate artery is noted. No significant calcifications are present at the aortic arch. No aneurysm or stenosis is present. Right carotid system: The right common carotid artery is within normal limits. Bifurcation is unremarkable. Mild tortuosity is present cervical right ICA without  significant stenosis. Left carotid system: The left common carotid artery is within normal limits. Minimal calcifications present bifurcation without significant stenosis. Mild tortuosity is present cervical left ICA without significant stenosis. Vertebral arteries: The vertebral arteries originate from the subclavian arteries bilaterally without significant stenosis. The left vertebral artery is the dominant vessel. No significant stenosis present in the neck. Skeleton: Cervical spine is fused C4-6 probable fusion is present at C6-7. Grade 2 anterolisthesis at C7-T1 measures 5 cm. Fusion is present at T1-2 and likely T2-3. Other neck: Soft tissues the neck are otherwise unremarkable. Upper chest: Scarring is noted at the lung apices bilaterally. Lungs are otherwise clear. Thoracic inlet is within normal limits. Review of the MIP images confirms the above findings CTA HEAD FINDINGS Anterior circulation: The internal carotid arteries are within normal limits through the ICA termini bilaterally. The A1 and M1 segments are normal. The A1 and M1 segments are normal. The anterior communicating artery is patent. The MCA bifurcations are intact bilaterally. ACA and MCA branch vessels are unremarkable. Posterior circulation: Left vertebral artery is dominant. PICA origins are visualized and normal. The vertebrobasilar junction is normal. Basilar artery is normal. Both posterior cerebral arteries originate from the basilar tip. The PCA branch vessels are within normal limits. Venous sinuses: The dural sinuses are patent. The straight sinus  and deep cerebral veins are intact. Cortical veins are unremarkable. Anatomic variants: None Review of the MIP images confirms the above findings IMPRESSION: 1. No emergent large vessel occlusion. 2. Minimal atherosclerotic changes at the left carotid bifurcation without significant stenosis. 3. Mild tortuosity of the cervical internal carotid arteries bilaterally without significant  stenosis. 4. Normal variant Circle of Willis without significant proximal stenosis, aneurysm, or branch vessel occlusion. 5. Grade 2 anterolisthesis at C7-T1 measures 5 cm. The cervical spine is fused above this level. Electronically Signed   By: San Morelle M.D.   On: 06/19/2020 17:04   CT HEAD WO CONTRAST  Result Date: 07/11/2020 CLINICAL DATA:  TIA this morning, headache, blurry vision, left upper extremity weakness now resolved. No reported injury. EXAM: CT HEAD WITHOUT CONTRAST TECHNIQUE: Contiguous axial images were obtained from the base of the skull through the vertex without intravenous contrast. COMPARISON:  06/19/2020 head CT FINDINGS: Brain: No evidence of parenchymal hemorrhage or extra-axial fluid collection. No mass lesion, mass effect, or midline shift. No CT evidence of acute infarction. Generalized cerebral volume loss. Nonspecific moderate subcortical and periventricular white matter hypodensity, most in keeping with chronic small vessel ischemic change. No ventriculomegaly. Vascular: No acute abnormality. Skull: No evidence of calvarial fracture. Sinuses/Orbits: The visualized paranasal sinuses are essentially clear. Other:  The mastoid air cells are unopacified. IMPRESSION: 1. No evidence of acute intracranial abnormality. 2. Generalized cerebral volume loss and moderate chronic small vessel ischemic changes in the cerebral white matter. Electronically Signed   By: Ilona Sorrel M.D.   On: 07/11/2020 09:50   CT Head Wo Contrast  Result Date: 06/19/2020 CLINICAL DATA:  Extremity weakness EXAM: CT HEAD WITHOUT CONTRAST TECHNIQUE: Contiguous axial images were obtained from the base of the skull through the vertex without intravenous contrast. COMPARISON:  CT 03/02/2020 FINDINGS: Brain: No evidence of acute infarction, hemorrhage, hydrocephalus, extra-axial collection or mass lesion/mass effect. Symmetric prominence of the ventricles, cisterns and sulci compatible with parenchymal  volume loss. Patchy areas of white matter hypoattenuation are most compatible with chronic microvascular angiopathy. Vascular: Atherosclerotic calcification of the carotid siphons. No hyperdense vessel. Skull: No calvarial fracture or suspicious osseous lesion. No scalp swelling or hematoma. Sinuses/Orbits: Paranasal sinuses and mastoid air cells are predominantly clear. Orbital structures are unremarkable aside from prior lens extractions. Other: None. IMPRESSION: 1. No acute intracranial findings. 2. Chronic microvascular angiopathy and parenchymal volume loss. Electronically Signed   By: Lovena Le M.D.   On: 06/19/2020 15:10   CT Code Stroke CTA Neck W/WO contrast  Result Date: 07/11/2020 CLINICAL DATA:  Headache. Blurred vision. Left upper extremity weakness. EXAM: CT ANGIOGRAPHY HEAD AND NECK CT PERFUSION BRAIN TECHNIQUE: Multidetector CT imaging of the head and neck was performed using the standard protocol during bolus administration of intravenous contrast. Multiplanar CT image reconstructions and MIPs were obtained to evaluate the vascular anatomy. Carotid stenosis measurements (when applicable) are obtained utilizing NASCET criteria, using the distal internal carotid diameter as the denominator. Multiphase CT imaging of the brain was performed following IV bolus contrast injection. Subsequent parametric perfusion maps were calculated using RAPID software. CONTRAST:  141mL OMNIPAQUE IOHEXOL 350 MG/ML SOLN COMPARISON:  Head CT earlier same day. FINDINGS: CTA NECK FINDINGS Aortic arch: Aortic atherosclerosis and tortuosity. No aneurysm or dissection. Branching pattern is normal without origin stenosis. Right carotid system: Common carotid artery widely patent to the bifurcation. Carotid bifurcation is widely patent without soft or calcified plaque. Cervical ICA widely patent. Left carotid system: Common carotid artery  widely patent to the bifurcation. Minimal calcified plaque at the carotid bifurcation  but no stenosis. Cervical ICA widely patent. Vertebral arteries: Both vertebral artery origins are widely patent. Both vertebral arteries appeared widely patent and normal through the cervical region to the foramen magnum. Skeleton: Distant cervical fusion. Chronic degenerative anterolisthesis at C7-T1. Old minor superior endplate deformity at T6. Other neck: No mass or lymphadenopathy. Upper chest:  Scarring at the lung apices. No active process. Review of the MIP images confirms the above findings CTA HEAD FINDINGS Anterior circulation: Both internal carotid arteries are widely patent through the skull base and siphon regions. No siphon stenosis. The anterior and middle cerebral vessels are normal without proximal stenosis, aneurysm or vascular malformation. No large or medium vessel occlusion. Posterior circulation: Both vertebral arteries are widely patent to the basilar. No basilar stenosis. Posterior circulation branch vessels appear normal. Venous sinuses: Patent and normal. Anatomic variants: None Review of the MIP images confirms the above findings CT Brain Perfusion Findings: ASPECTS: 10 CBF (<30%) Volume: 58mL Perfusion (Tmax>6.0s) volume: 79mL Mismatch Volume: 38mL Infarction Location:None IMPRESSION: 1. No large or medium vessel occlusion. Normal perfusion study. 2. Aortic atherosclerosis. 3. Minimal calcified plaque at the left carotid bifurcation but no stenosis. Aortic Atherosclerosis (ICD10-I70.0). Electronically Signed   By: Nelson Chimes M.D.   On: 07/11/2020 12:30   CT Angio Neck W and/or Wo Contrast  Result Date: 06/19/2020 CLINICAL DATA:  Extremity weakness.  Stroke suspected. EXAM: CT ANGIOGRAPHY HEAD AND NECK TECHNIQUE: Multidetector CT imaging of the head and neck was performed using the standard protocol during bolus administration of intravenous contrast. Multiplanar CT image reconstructions and MIPs were obtained to evaluate the vascular anatomy. Carotid stenosis measurements (when  applicable) are obtained utilizing NASCET criteria, using the distal internal carotid diameter as the denominator. CONTRAST:  37mL OMNIPAQUE IOHEXOL 350 MG/ML SOLN COMPARISON:  CT head without contrast 06/19/2020 and 03/02/2020. FINDINGS: CTA NECK FINDINGS Aortic arch: Common origin of the left common carotid artery and innominate artery is noted. No significant calcifications are present at the aortic arch. No aneurysm or stenosis is present. Right carotid system: The right common carotid artery is within normal limits. Bifurcation is unremarkable. Mild tortuosity is present cervical right ICA without significant stenosis. Left carotid system: The left common carotid artery is within normal limits. Minimal calcifications present bifurcation without significant stenosis. Mild tortuosity is present cervical left ICA without significant stenosis. Vertebral arteries: The vertebral arteries originate from the subclavian arteries bilaterally without significant stenosis. The left vertebral artery is the dominant vessel. No significant stenosis present in the neck. Skeleton: Cervical spine is fused C4-6 probable fusion is present at C6-7. Grade 2 anterolisthesis at C7-T1 measures 5 cm. Fusion is present at T1-2 and likely T2-3. Other neck: Soft tissues the neck are otherwise unremarkable. Upper chest: Scarring is noted at the lung apices bilaterally. Lungs are otherwise clear. Thoracic inlet is within normal limits. Review of the MIP images confirms the above findings CTA HEAD FINDINGS Anterior circulation: The internal carotid arteries are within normal limits through the ICA termini bilaterally. The A1 and M1 segments are normal. The A1 and M1 segments are normal. The anterior communicating artery is patent. The MCA bifurcations are intact bilaterally. ACA and MCA branch vessels are unremarkable. Posterior circulation: Left vertebral artery is dominant. PICA origins are visualized and normal. The vertebrobasilar  junction is normal. Basilar artery is normal. Both posterior cerebral arteries originate from the basilar tip. The PCA branch vessels are within normal  limits. Venous sinuses: The dural sinuses are patent. The straight sinus and deep cerebral veins are intact. Cortical veins are unremarkable. Anatomic variants: None Review of the MIP images confirms the above findings IMPRESSION: 1. No emergent large vessel occlusion. 2. Minimal atherosclerotic changes at the left carotid bifurcation without significant stenosis. 3. Mild tortuosity of the cervical internal carotid arteries bilaterally without significant stenosis. 4. Normal variant Circle of Willis without significant proximal stenosis, aneurysm, or branch vessel occlusion. 5. Grade 2 anterolisthesis at C7-T1 measures 5 cm. The cervical spine is fused above this level. Electronically Signed   By: San Morelle M.D.   On: 06/19/2020 17:04   MR BRAIN WO CONTRAST  Result Date: 06/19/2020 CLINICAL DATA:  Follow-up examination for acute stroke. EXAM: MRI HEAD WITHOUT CONTRAST TECHNIQUE: Multiplanar, multiecho pulse sequences of the brain and surrounding structures were obtained without intravenous contrast. COMPARISON:  Prior CTs from earlier the same day. FINDINGS: Brain: Cerebral volume within normal limits for age. Mild scattered T2/FLAIR hyperintensity seen involving the periventricular deep white matter both cerebral hemispheres, most like related chronic microvascular ischemic disease, fairly typical/normal for age. No abnormal foci of restricted diffusion to suggest acute or subacute ischemia. Gray-white matter differentiation maintained. No encephalomalacia to suggest chronic cortical infarction. No foci of susceptibility artifact to suggest acute or chronic intracranial hemorrhage. No mass lesion, midline shift or mass effect. No hydrocephalus or extra-axial fluid collection. Pituitary gland suprasellar region normal. Midline structures intact.  Vascular: Major intracranial vascular flow voids are maintained. Skull and upper cervical spine: Degenerative osteoarthritic changes noted about the dens. Craniocervical junction otherwise unremarkable. Bone marrow signal intensity within normal limits. No scalp soft tissue abnormality. Sinuses/Orbits: Patient status post bilateral ocular lens replacement. Globes and orbital soft tissues demonstrate no acute finding. Paranasal sinuses are largely clear. No significant mastoid effusion. Inner ear structures grossly normal. Other: None. IMPRESSION: Normal brain MRI for age. No acute intracranial infarct or other abnormality. Electronically Signed   By: Jeannine Boga M.D.   On: 06/19/2020 20:22   MR BRAIN W WO CONTRAST  Result Date: 07/11/2020 CLINICAL DATA:  Left-sided weakness neglect EXAM: MRI HEAD WITHOUT AND WITH CONTRAST TECHNIQUE: Multiplanar, multiecho pulse sequences of the brain and surrounding structures were obtained without and with intravenous contrast. CONTRAST:  31mL GADAVIST GADOBUTROL 1 MMOL/ML IV SOLN COMPARISON:  06/19/2020 FINDINGS: Motion artifact is present. Brain: There is no acute infarction or intracranial hemorrhage. There is no intracranial mass, mass effect, or edema. There is no hydrocephalus or extra-axial fluid collection. Prominence of the ventricles and sulci reflects generalized parenchymal volume loss similar to the prior study. Patchy T2 hyperintensity in the supratentorial white matter is nonspecific but probably reflects stable chronic microvascular ischemic changes. There is no abnormal enhancement. Vascular: Major vessel flow voids at the skull base are preserved. Skull and upper cervical spine: Normal marrow signal is preserved. Sinuses/Orbits: Paranasal sinuses are aerated. Bilateral lens replacements. Other: Sella is unremarkable.  Mastoid air cells are clear. IMPRESSION: Motion degraded. No acute intracranial abnormality. Stable chronic findings detailed above.  Electronically Signed   By: Macy Mis M.D.   On: 07/11/2020 18:45   CT Code Stroke Cerebral Perfusion with contrast  Result Date: 07/11/2020 CLINICAL DATA:  Headache. Blurred vision. Left upper extremity weakness. EXAM: CT ANGIOGRAPHY HEAD AND NECK CT PERFUSION BRAIN TECHNIQUE: Multidetector CT imaging of the head and neck was performed using the standard protocol during bolus administration of intravenous contrast. Multiplanar CT image reconstructions and MIPs were obtained to evaluate  the vascular anatomy. Carotid stenosis measurements (when applicable) are obtained utilizing NASCET criteria, using the distal internal carotid diameter as the denominator. Multiphase CT imaging of the brain was performed following IV bolus contrast injection. Subsequent parametric perfusion maps were calculated using RAPID software. CONTRAST:  163mL OMNIPAQUE IOHEXOL 350 MG/ML SOLN COMPARISON:  Head CT earlier same day. FINDINGS: CTA NECK FINDINGS Aortic arch: Aortic atherosclerosis and tortuosity. No aneurysm or dissection. Branching pattern is normal without origin stenosis. Right carotid system: Common carotid artery widely patent to the bifurcation. Carotid bifurcation is widely patent without soft or calcified plaque. Cervical ICA widely patent. Left carotid system: Common carotid artery widely patent to the bifurcation. Minimal calcified plaque at the carotid bifurcation but no stenosis. Cervical ICA widely patent. Vertebral arteries: Both vertebral artery origins are widely patent. Both vertebral arteries appeared widely patent and normal through the cervical region to the foramen magnum. Skeleton: Distant cervical fusion. Chronic degenerative anterolisthesis at C7-T1. Old minor superior endplate deformity at T6. Other neck: No mass or lymphadenopathy. Upper chest:  Scarring at the lung apices. No active process. Review of the MIP images confirms the above findings CTA HEAD FINDINGS Anterior circulation: Both  internal carotid arteries are widely patent through the skull base and siphon regions. No siphon stenosis. The anterior and middle cerebral vessels are normal without proximal stenosis, aneurysm or vascular malformation. No large or medium vessel occlusion. Posterior circulation: Both vertebral arteries are widely patent to the basilar. No basilar stenosis. Posterior circulation branch vessels appear normal. Venous sinuses: Patent and normal. Anatomic variants: None Review of the MIP images confirms the above findings CT Brain Perfusion Findings: ASPECTS: 10 CBF (<30%) Volume: 51mL Perfusion (Tmax>6.0s) volume: 38mL Mismatch Volume: 68mL Infarction Location:None IMPRESSION: 1. No large or medium vessel occlusion. Normal perfusion study. 2. Aortic atherosclerosis. 3. Minimal calcified plaque at the left carotid bifurcation but no stenosis. Aortic Atherosclerosis (ICD10-I70.0). Electronically Signed   By: Nelson Chimes M.D.   On: 07/11/2020 12:30   ECHOCARDIOGRAM COMPLETE  Result Date: 06/20/2020    ECHOCARDIOGRAM REPORT   Patient Name:   Sara Davila Date of Exam: 06/20/2020 Medical Rec #:  [id number]      Height:       60.0 in Accession #:    [id number]     Weight:       91.7 lb Date of Birth:  1930/07/08     BSA:          1.340 m Patient Age:    28 years       BP:           135/80 mmHg Patient Gender: F              HR:           84 bpm. Exam Location:  Inpatient Procedure: 2D Echo, Cardiac Doppler and Color Doppler Indications:    Stroke 434.91 / I163.9  History:        Patient has no prior history of Echocardiogram examinations.                 TIA; Risk Factors:Hypertension and Former Smoker. GERD.  Sonographer:    Vickie Epley RDCS Referring Phys: [id number] [place]  1. Left ventricular ejection fraction, by estimation, is 60 to 65%. The left ventricle has normal function. The left ventricle has no regional wall motion abnormalities. There is mild left ventricular hypertrophy. Left ventricular  diastolic parameters were normal.  2. Right ventricular systolic  function is normal. The right ventricular size is normal. There is normal pulmonary artery systolic pressure. The estimated right ventricular systolic pressure is 16.1 mmHg.  3. The mitral valve is normal in structure. Significant calcification of subvalvular apparatus. No evidence of mitral valve regurgitation.  4. The aortic valve is tricuspid. Aortic valve regurgitation is trivial. No aortic stenosis is present.  5. The inferior vena cava is normal in size with <50% respiratory variability, suggesting right atrial pressure of 8 mmHg. FINDINGS  Left Ventricle: Left ventricular ejection fraction, by estimation, is 60 to 65%. The left ventricle has normal function. The left ventricle has no regional wall motion abnormalities. The left ventricular internal cavity size was small. There is mild left ventricular hypertrophy. Left ventricular diastolic parameters were normal. Right Ventricle: The right ventricular size is normal. Right vetricular wall thickness was not assessed. Right ventricular systolic function is normal. There is normal pulmonary artery systolic pressure. The tricuspid regurgitant velocity is 2.28 m/s, and with an assumed right atrial pressure of 8 mmHg, the estimated right ventricular systolic pressure is 09.6 mmHg. Left Atrium: Left atrial size was normal in size. Right Atrium: Right atrial size was normal in size. Pericardium: There is no evidence of pericardial effusion. Mitral Valve: The mitral valve is normal in structure. No evidence of mitral valve regurgitation. Tricuspid Valve: The tricuspid valve is normal in structure. Tricuspid valve regurgitation is trivial. Aortic Valve: The aortic valve is tricuspid. Aortic valve regurgitation is trivial. No aortic stenosis is present. Pulmonic Valve: The pulmonic valve was not well visualized. Pulmonic valve regurgitation is not visualized. Aorta: The aortic root is normal in size and  structure. Venous: The inferior vena cava is normal in size with less than 50% respiratory variability, suggesting right atrial pressure of 8 mmHg. IAS/Shunts: The interatrial septum was not well visualized.  LEFT VENTRICLE PLAX 2D LVIDd:         3.30 cm     Diastology LVIDs:         2.30 cm     LV e' lateral:   6.20 cm/s LV PW:         0.70 cm     LV E/e' lateral: 10.2 LV IVS:        0.70 cm     LV e' medial:    5.11 cm/s LVOT diam:     1.80 cm     LV E/e' medial:  12.3 LV SV:         40 LV SV Index:   30 LVOT Area:     2.54 cm  LV Volumes (MOD) LV vol d, MOD A2C: 42.7 ml LV vol d, MOD A4C: 65.8 ml LV vol s, MOD A2C: 18.2 ml LV vol s, MOD A4C: 23.1 ml LV SV MOD A2C:     24.5 ml LV SV MOD A4C:     65.8 ml LV SV MOD BP:      33.4 ml RIGHT VENTRICLE RV S prime:     10.60 cm/s TAPSE (M-mode): 1.8 cm LEFT ATRIUM             Index      RIGHT ATRIUM          Index LA diam:        2.50 cm 1.87 cm/m RA Area:     6.17 cm LA Vol (A2C):   9.8 ml  7.31 ml/m RA Volume:   8.97 ml  6.69 ml/m LA Vol (A4C):   11.1 ml 8.28  ml/m LA Biplane Vol: 11.1 ml 8.28 ml/m  AORTIC VALVE LVOT Vmax:   73.80 cm/s LVOT Vmean:  55.200 cm/s LVOT VTI:    0.158 m  AORTA Ao Root diam: 3.10 cm MITRAL VALVE               TRICUSPID VALVE MV Area (PHT): 3.77 cm    TR Peak grad:   20.8 mmHg MV Decel Time: 201 msec    TR Vmax:        228.00 cm/s MV E velocity: 63.00 cm/s MV A velocity: 98.50 cm/s  SHUNTS MV E/A ratio:  0.64        Systemic VTI:  0.16 m                            Systemic Diam: 1.80 cm Oswaldo Milian MD Electronically signed by Oswaldo Milian MD Signature Date/Time: 06/20/2020/3:06:01 PM    Final        Subjective: Patient is feeling better, no clinical sings of recurrent seizures. Tolerating po well, no nausea or vomiting. Patient continue to be very weak and deconditioned.   Discharge Exam: Vitals:   07/15/20 0544 07/15/20 0820  BP: 137/85 134/90  Pulse: 85 85  Resp: 17 18  Temp: 98.1 F (36.7 C) (!) 97.3 F  (36.3 C)  SpO2: 97% 98%   Vitals:   07/14/20 2012 07/15/20 0016 07/15/20 0544 07/15/20 0820  BP: 119/85 (!) 144/73 137/85 134/90  Pulse: 86 83 85 85  Resp: 16 16 17 18   Temp: 98.4 F (36.9 C) (!) 97.4 F (36.3 C) 98.1 F (36.7 C) (!) 97.3 F (36.3 C)  TempSrc: Oral Oral Oral Oral  SpO2: 95% 94% 97% 98%    General: deconditioned  Neurology: Awake and alert, non focal  E ENT: mild pallor, no icterus, oral mucosa moist Cardiovascular: No JVD. S1-S2 present, rhythmic, no gallops, rubs, or murmurs. No lower extremity edema. Pulmonary: positive breath sounds bilaterally, adequate air movement, no wheezing, rhonchi or rales. Gastrointestinal. Abdomen soft and non tender Skin. No rashes Musculoskeletal: no joint deformities   The results of significant diagnostics from this hospitalization (including imaging, microbiology, ancillary and laboratory) are listed below for reference.     Microbiology: Recent Results (from the past 240 hour(s))  Urine culture     Status: Abnormal   Collection Time: 07/11/20 10:10 AM   Specimen: Urine, Random  Result Value Ref Range Status   Specimen Description URINE, RANDOM  Final   Special Requests   Final    NONE Performed at [facility], [address], [place], [place] [address]    Culture MULTIPLE SPECIES PRESENT, SUGGEST RECOLLECTION (A)  Final   Report Status 07/12/2020 FINAL  Final  SARS Coronavirus 2 by RT PCR (hospital order, performed in [facility] hospital lab) Nasopharyngeal Nasopharyngeal Swab     Status: None   Collection Time: 07/11/20 12:35 PM   Specimen: Nasopharyngeal Swab  Result Value Ref Range Status   SARS Coronavirus 2 NEGATIVE NEGATIVE Final    Comment: (NOTE) SARS-CoV-2 target nucleic acids are NOT DETECTED.  The SARS-CoV-2 RNA is generally detectable in upper and lower respiratory specimens during the acute phase of infection. The lowest concentration of SARS-CoV-2 viral copies this assay can detect is  250 copies / mL. A negative result does not preclude SARS-CoV-2 infection and should not be used as the sole basis for treatment or other patient management decisions.  A  negative result may occur with improper specimen collection / handling, submission of specimen other than nasopharyngeal swab, presence of viral mutation(s) within the areas targeted by this assay, and inadequate number of viral copies (<250 copies / mL). A negative result must be combined with clinical observations, patient history, and epidemiological information.  Fact Sheet for Patients:   StrictlyIdeas.no  Fact Sheet for Healthcare Providers: BankingDealers.co.za  This test is not yet approved or  cleared by the [place] FDA and has been authorized for detection and/or diagnosis of SARS-CoV-2 by FDA under an Emergency Use Authorization (EUA).  This EUA will remain in effect (meaning this test can be used) for the duration of the COVID-19 declaration under Section 564(b)(1) of the Act, 21 U.S.C. section 360bbb-3(b)(1), unless the authorization is terminated or revoked sooner.  Performed at [facility], [place] [address], [place], [place] [address]      Labs: BNP (last 3 results) No results for input(s): BNP in the last 8760 hours. Basic Metabolic Panel: Recent Labs  Lab 07/11/20 0914 07/12/20 0729 07/13/20 0255 07/14/20 0307  NA 127* 131* 133* 134*  K 4.2 3.6 3.7 4.0  CL 92* 97* 101 102  CO2 26 24 24 23   GLUCOSE 112* 99 108* 108*  BUN 9 11 16 12   CREATININE 0.65 0.67 0.72 0.65  CALCIUM 10.2 9.4 8.7* 9.1   Liver Function Tests: Recent Labs  Lab 07/11/20 0914  AST 24  ALT 19  ALKPHOS 62  BILITOT 0.8  PROT 6.8  ALBUMIN 3.9   No results for input(s): LIPASE, AMYLASE in the last 168 hours. No results for input(s): AMMONIA in the last 168 hours. CBC: Recent Labs  Lab 07/11/20 0914  WBC 7.1  NEUTROABS 5.5  HGB 13.2  HCT 40.3  MCV  96.0  PLT 521*   Cardiac Enzymes: No results for input(s): CKTOTAL, CKMB, CKMBINDEX, TROPONINI in the last 168 hours. BNP: Invalid input(s): POCBNP CBG: Recent Labs  Lab 07/11/20 0904  GLUCAP 101*   D-Dimer No results for input(s): DDIMER in the last 72 hours. Hgb A1c No results for input(s): HGBA1C in the last 72 hours. Lipid Profile No results for input(s): CHOL, HDL, LDLCALC, TRIG, CHOLHDL, LDLDIRECT in the last 72 hours. Thyroid function studies No results for input(s): TSH, T4TOTAL, T3FREE, THYROIDAB in the last 72 hours.  Invalid input(s): FREET3 Anemia work up No results for input(s): VITAMINB12, FOLATE, FERRITIN, TIBC, IRON, RETICCTPCT in the last 72 hours. Urinalysis    Component Value Date/Time   COLORURINE STRAW (A) 07/11/2020 1125   APPEARANCEUR CLEAR 07/11/2020 1125   LABSPEC 1.005 07/11/2020 1125   PHURINE 8.0 07/11/2020 1125   GLUCOSEU NEGATIVE 07/11/2020 1125   HGBUR NEGATIVE 07/11/2020 1125   BILIRUBINUR NEGATIVE 07/11/2020 1125   KETONESUR NEGATIVE 07/11/2020 1125   PROTEINUR NEGATIVE 07/11/2020 1125   NITRITE NEGATIVE 07/11/2020 1125   LEUKOCYTESUR NEGATIVE 07/11/2020 1125   Sepsis Labs Invalid input(s): PROCALCITONIN,  WBC,  LACTICIDVEN Microbiology Recent Results (from the past 240 hour(s))  Urine culture     Status: Abnormal   Collection Time: 07/11/20 10:10 AM   Specimen: Urine, Random  Result Value Ref Range Status   Specimen Description URINE, RANDOM  Final   Special Requests   Final    NONE Performed at [facility], [place] [address], [place], [place] [address]    Culture MULTIPLE SPECIES PRESENT, SUGGEST RECOLLECTION (A)  Final   Report Status 07/12/2020 FINAL  Final  SARS Coronavirus 2  by RT PCR (hospital order, performed in [facility] hospital lab) Nasopharyngeal Nasopharyngeal Swab     Status: None   Collection Time: 07/11/20 12:35 PM   Specimen: Nasopharyngeal Swab  Result Value Ref Range Status   SARS Coronavirus 2  NEGATIVE NEGATIVE Final    Comment: (NOTE) SARS-CoV-2 target nucleic acids are NOT DETECTED.  The SARS-CoV-2 RNA is generally detectable in upper and lower respiratory specimens during the acute phase of infection. The lowest concentration of SARS-CoV-2 viral copies this assay can detect is 250 copies / mL. A negative result does not preclude SARS-CoV-2 infection and should not be used as the sole basis for treatment or other patient management decisions.  A negative result may occur with improper specimen collection / handling, submission of specimen other than nasopharyngeal swab, presence of viral mutation(s) within the areas targeted by this assay, and inadequate number of viral copies (<250 copies / mL). A negative result must be combined with clinical observations, patient history, and epidemiological information.  Fact Sheet for Patients:   StrictlyIdeas.no  Fact Sheet for Healthcare Providers: BankingDealers.co.za  This test is not yet approved or  cleared by the [place] FDA and has been authorized for detection and/or diagnosis of SARS-CoV-2 by FDA under an Emergency Use Authorization (EUA).  This EUA will remain in effect (meaning this test can be used) for the duration of the COVID-19 declaration under Section 564(b)(1) of the Act, 21 U.S.C. section 360bbb-3(b)(1), unless the authorization is terminated or revoked sooner.  Performed at [facility], [place] [address], [place], [place] [address]      Time coordinating discharge: 45 minutes  SIGNED:   Tawni Millers, MD  [facility] 07/15/2020, 8:24 AM

## 2020-07-16 MED ORDER — DICLOFENAC SODIUM 1 % EX GEL
2.0000 g | Freq: Four times a day (QID) | CUTANEOUS | Status: DC
  Administered 2020-07-16 – 2020-07-18 (×8): 2 g via TOPICAL
  Filled 2020-07-16: qty 100

## 2020-07-16 NOTE — Progress Notes (Signed)
Occupational Therapy Treatment Patient Details Name: Sara Davila MRN: [id number] DOB: 1930/10/28 Today's Date: 07/16/2020    History of present illness Pt is a 86 y/o female with PMH of TIA, hypothyroidism, neuropathy, anxiety, pneumonia, fibromyalgia, L femur fx s/p IM nail on 03/03/20, and insomnia who presented with complaints of L sided weakness. CT and MRI negative for acute abnormalities. 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    OT comments  Patient supine in bed upon entry and agreeable to OT session.  Completing in room mobility using RW with min guard assist and toilet transfers with min assist, toileting with mod assist for clothing management and balance, grooming at sink with min guard assist.  Remains limited today by decreased activity tolerance, impaired cognition, and generalized weakness. Continue to recommend CIR, patient eager to dc to CIR to optimize return to PLOF.  Will follow acutely.    Follow Up Recommendations  CIR;Supervision/Assistance - 24 hour    Equipment Recommendations  3 in 1 bedside commode    Recommendations for Other Services      Precautions / Restrictions Precautions Precautions: Fall Restrictions Weight Bearing Restrictions: No       Mobility Bed Mobility Overal bed mobility: Needs Assistance Bed Mobility: Supine to Sit;Sit to Supine     Supine to sit: Min assist Sit to supine: Supervision   General bed mobility comments: min assist for trunk support to EOB, increased tiem and effort   Transfers Overall transfer level: Needs assistance Equipment used: Rolling walker (2 wheeled);1 person hand held assist Transfers: Sit to/from Stand Sit to Stand: Min assist         General transfer comment: min assist to power up and steady from EOB and commode,  cueing for hand placement and safety     Balance Overall balance assessment: Needs assistance Sitting-balance support: Feet supported Sitting balance-Leahy Scale: Fair     Standing balance support: Bilateral upper extremity supported;No upper extremity supported;During functional activity Standing balance-Leahy Scale: Poor Standing balance comment: able to engage in grooming without UE support standing but relies on BUE support dynamically                           ADL either performed or assessed with clinical judgement   ADL Overall ADL's : Needs assistance/impaired     Grooming: Wash/dry hands;Min guard;Standing               Lower Body Dressing: Maximal assistance;Sit to/from stand Lower Body Dressing Details (indicate cue type and reason): assist to don socks, min assist sit to stand  Toilet Transfer: Minimal assistance;Ambulation;RW;Grab bars   Toileting- Clothing Manipulation and Hygiene: Moderate assistance;Sit to/from stand Toileting - Clothing Manipulation Details (indicate cue type and reason): for clothing mgmt      Functional mobility during ADLs: Minimal assistance;Rolling walker General ADL Comments: pt limited by decreased activity tolerance, cognition, balance      Vision       Perception     Praxis      Cognition Arousal/Alertness: Awake/alert Behavior During Therapy: WFL for tasks assessed/performed Overall Cognitive Status: Impaired/Different from baseline Area of Impairment: Memory;Safety/judgement;Awareness;Problem solving                     Memory: Decreased short-term memory Following Commands: Follows one step commands consistently;Follows one  step commands with increased time;Follows multi-step commands inconsistently Safety/Judgement: Decreased awareness of safety;Decreased awareness of deficits Awareness: Emergent Problem Solving: Slow processing;Requires verbal cues General Comments: patient with improved  awareness to deficits and need for assistance today, following simple commands and sequencing basic ADLs with supervision         Exercises     Shoulder Instructions       General Comments      Pertinent Vitals/ Pain       Pain Assessment: Faces Faces Pain Scale: Hurts a little bit Pain Location: R foot  Pain Descriptors / Indicators: Discomfort Pain Intervention(s): Limited activity within patient's tolerance;Monitored during session;Repositioned  Home Living                                          Prior Functioning/Environment              Frequency  Min 2X/week        Progress Toward Goals  OT Goals(current goals can now be found in the care plan section)  Progress towards OT goals: Progressing toward goals  Acute Rehab OT Goals Patient Stated Goal: to get to rehab OT Goal Formulation: With patient  Plan Discharge plan remains appropriate;Frequency remains appropriate    Co-evaluation                 AM-PAC OT "6 Clicks" Daily Activity     Outcome Measure   Help from another person eating meals?: A Little Help from another person taking care of personal grooming?: A Little Help from another person toileting, which includes using toliet, bedpan, or urinal?: A Lot Help from another person bathing (including washing, rinsing, drying)?: A Lot Help from another person to put on and taking off regular upper body clothing?: A Little Help from another person to put on and taking off regular lower body clothing?: A Lot 6 Click Score: 15    End of Session Equipment Utilized During Treatment: Gait belt;Rolling walker  OT Visit Diagnosis: Other abnormalities of gait and mobility (R26.89);Muscle weakness (generalized) (M62.81);Pain;Other symptoms and signs involving cognitive function Pain - Right/Left: Right Pain - part of body: Ankle and joints of foot   Activity Tolerance Patient tolerated treatment well   Patient Left in bed;with  call bell/phone within reach;with bed alarm set   Nurse Communication Mobility status        Time: 4562-5638 OT Time Calculation (min): 17 min  Charges: OT General Charges $OT Visit: 1 Visit OT Treatments $Self Care/Home Management : 8-22 mins  Jolaine Artist, OT [place] Pager [phone] Office [place] 07/16/2020, 3:40 PM

## 2020-07-16 NOTE — Progress Notes (Signed)
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  86 year old female with significant past medical history for TIA, hypothyroidism, GERD, neuropathy, anxiety and insomnia. She reported intermittent left-sided weakness, for about 24 hours in duration, associated with apraxia,headaches, photophobia and nausea. Her initial symptoms resolved after 20 minutes but recur about 12 hours later, prompting her to come to the hospital. On her initial physical examination blood pressure 151/83, heart rate 91, respiratory rate 13, oxygen saturation 96%. She had clear lungs to auscultation, heart S1-S2, present rhythmic, soft abdomen, no lower extremity edema. Neurologically patient was nonfocal. Sodium 127, potassium 4.2, chloride 92, bicarb 26, glucose 112, BUN 9, creatinine 0.65, white count 7.1, hemoglobin 13.0, hematocrit 40.2,platelets 521.SARS COVID-19 negative. Urinalysis specific gravity 1.005. Negative leukocytes. Head and NeckCT no acute changes. Head CT angiography with no large or medium vessel occlusion. EKG 88 bpm, normal axis, normal intervals, sinus rhythm, PAC, poor R wave progression, no ST segment or T wave changes, positive LVH.  Electroencephalography showed evidence of epileptogenicactivityarising from the right frontal region,as well as cortical dysfunction in the right hemisphere. Brain MRI with chronic microvascular changes.  Patient has been placed on antiepileptic therapy with Keppra. Physical therapy and occupational therapy have recommended inpatient rehab.   Assessment & Plan:   Principal Problem:   Seizure ([place]) Active Problems:   Hypothyroidism   Hyponatremia   Slow transit constipation   HTN (hypertension), benign   Thrombocytosis (HCC)   History of TIA (transient ischemic attack)   1. New onset seizure in  the setting old CVA.No further seizure activity, patient continue to be very weak and deconditioned, pending placement at inpatient rehab.  Continue with Keppra 750 mg po bid with good toleration.  2. History of CVA.No acute CVA. Continue withdual antiplatelet therapy with asa and clopidogrel.  On Atorvastatin 20 mg daily.   3. Hyponatremia/ hypokalemia.Patient is tolerating po well her electrolytes have been corrected and patient is tolerating po well with no nausea or vomiting.    4, HTN.Continue blood pressure monitoring   6. Hypothyroid.Onlevothyroxine.  7. Anxiety/ insomnia. On as needed lorazepam. Continue with melatonin and ambien.   8. Right foot pain. Recent trauma as outpatient before hospitalization,. Will continue pain control with topical diclofenac.   Status is: Inpatient  Remains inpatient appropriate because:Unsafe d/c plan and Inpatient level of care appropriate due to severity of illness   Dispo: The patient is from: Home              Anticipated d/c is to: CIR              Anticipated d/c date is: 1 day              Patient currently is medically stable to d/c.   DVT prophylaxis: Enoxaparin   Code Status:   dnr   Family Communication:  I spoke over the phone with the patient's son about patient's  condition, plan of care, prognosis and all questions were addressed.    Consultants:   Neurology      Subjective: Patient continue to be very weak and deconditioned, has pain on her right foot, worse to touch and walking. No radiation, moderate to severe in intensity   Objective: Vitals:   07/16/20 0349 07/16/20 0830 07/16/20 1110 07/16/20 1534  BP: 121/67 110/73 (!) 141/67 (!) 143/72  Pulse:  93 94 92 86  Resp: 16 18 20 16   Temp: 99.1 F (37.3 C) 97.7 F (36.5 C) (!) 97.4 F (36.3 C) 98.1 F (36.7 C)  TempSrc: Oral Oral Oral Oral  SpO2: 95% 99% 98% 95%    Intake/Output Summary (Last 24 hours) at 07/16/2020 1555 Last data filed at  07/16/2020 1537 Gross per 24 hour  Intake 600 ml  Output 1275 ml  Net -675 ml   There were no vitals filed for this visit.  Examination:   General: deconditioned  Neurology: Awake and alert, non focal  E ENT: no pallor, no icterus, oral mucosa moist Cardiovascular: No JVD. S1-S2 present, rhythmic, no gallops, rubs, or murmurs. No lower extremity edema. Pulmonary: positive breath sounds bilaterally, adequate air movement, no wheezing, rhonchi or rales. Gastrointestinal. Abdomen soft and non tender Musculoskeletal: right foot ecchymosis at the forefoot, with tender to palpation, and mild edema.     Data Reviewed: I have personally reviewed following labs and imaging studies  CBC: Recent Labs  Lab 07/11/20 0914  WBC 7.1  NEUTROABS 5.5  HGB 13.2  HCT 40.3  MCV 96.0  PLT 588*   Basic Metabolic Panel: Recent Labs  Lab 07/11/20 0914 07/12/20 0729 07/13/20 0255 07/14/20 0307  NA 127* 131* 133* 134*  K 4.2 3.6 3.7 4.0  CL 92* 97* 101 102  CO2 26 24 24 23   GLUCOSE 112* 99 108* 108*  BUN 9 11 16 12   CREATININE 0.65 0.67 0.72 0.65  CALCIUM 10.2 9.4 8.7* 9.1   GFR: CrCl cannot be calculated (Unknown ideal weight.). Liver Function Tests: Recent Labs  Lab 07/11/20 0914  AST 24  ALT 19  ALKPHOS 62  BILITOT 0.8  PROT 6.8  ALBUMIN 3.9   No results for input(s): LIPASE, AMYLASE in the last 168 hours. No results for input(s): AMMONIA in the last 168 hours. Coagulation Profile: Recent Labs  Lab 07/11/20 0914  INR 1.0   Cardiac Enzymes: No results for input(s): CKTOTAL, CKMB, CKMBINDEX, TROPONINI in the last 168 hours. BNP (last 3 results) No results for input(s): PROBNP in the last 8760 hours. HbA1C: No results for input(s): HGBA1C in the last 72 hours. CBG: Recent Labs  Lab 07/11/20 0904  GLUCAP 101*   Lipid Profile: No results for input(s): CHOL, HDL, LDLCALC, TRIG, CHOLHDL, LDLDIRECT in the last 72 hours. Thyroid Function Tests: No results for  input(s): TSH, T4TOTAL, FREET4, T3FREE, THYROIDAB in the last 72 hours. Anemia Panel: No results for input(s): VITAMINB12, FOLATE, FERRITIN, TIBC, IRON, RETICCTPCT in the last 72 hours.    Radiology Studies: I have reviewed all of the imaging during this hospital visit personally     Scheduled Meds:  aspirin EC  81 mg Oral Daily   atorvastatin  20 mg Oral Daily   clopidogrel  75 mg Oral Daily   diclofenac Sodium  2 g Topical QID   enoxaparin (LOVENOX) injection  40 mg Subcutaneous Q24H   levETIRAcetam  750 mg Oral BID   levothyroxine  75 mcg Oral QAC breakfast   melatonin  3 mg Oral QHS   pantoprazole  40 mg Oral Daily   polyethylene glycol  17 g Oral Daily   saccharomyces boulardii  250 mg Oral Daily   senna-docusate  2 tablet Oral BID   sodium chloride flush  3 mL Intravenous Once   Continuous Infusions:   LOS: 4 days        Anders Hohmann Gerome Apley, MD

## 2020-07-17 LAB — SARS CORONAVIRUS 2 BY RT PCR (HOSPITAL ORDER, PERFORMED IN ~~LOC~~ HOSPITAL LAB): SARS Coronavirus 2: NEGATIVE

## 2020-07-17 MED ORDER — ALUM & MAG HYDROXIDE-SIMETH 200-200-20 MG/5ML PO SUSP: 30.0000 mL | ORAL | Status: DC | PRN

## 2020-07-17 NOTE — Progress Notes (Signed)
PROGRESS NOTE  Sara Davila LPF:[id number] DOB: 24-Aug-1930 DOA: 07/11/2020 PCP: [name], MD   LOS: 5 days   Brief Narrative / Interim history: 86 year old female with prior CVA, hypothyroidism, GERD, neuropathy, recent hip fracture status post left-sided IM nailing of femur in April 2021, presents to the hospital complaining of left-sided weakness, intermittent.  Neurology has been consulted.  She underwent an EEG which showed evidence of epileptogenic activity from the right frontal region as well as cortical dysfunction in the right hemisphere.  Brain MRI showed chronic microvascular changes.  She has been placed on antiepileptic therapy with Keppra  Subjective / 24h Interval events: She is doing well, somewhat frustrated that she is not in rehab yet  Assessment & Plan: Principal Problem New onset seizures in the setting of old CVA-patient was placed on Keppra.  Neurology consulted and followed patient while hospitalized.  She had no further seizure activity, will continue Keppra on discharge.  She has been continued to be very weak and deconditioned, physical therapy recommended inpatient rehab however insurance denied coverage for that.  I did peer to peer on 8/18 and they approved SNF.  Social worker updated, she is stable for SNF discharge  Active Problems History of CVA-no acute CVA based on MRI.  She is on dual antiplatelet therapy, continue.  Continue statin  Hyponatremia/hypokalemia-corrected  Hyperlipidemia-continue statin  Hypothyroidism-continue Synthroid   Scheduled Meds: . aspirin EC  81 mg Oral Daily  . atorvastatin  20 mg Oral Daily  . clopidogrel  75 mg Oral Daily  . diclofenac Sodium  2 g Topical QID  . enoxaparin (LOVENOX) injection  40 mg Subcutaneous Q24H  . levETIRAcetam  750 mg Oral BID  . levothyroxine  75 mcg Oral QAC breakfast  . melatonin  3 mg Oral QHS  . pantoprazole  40 mg Oral Daily  . polyethylene glycol  17 g Oral Daily  .  saccharomyces boulardii  250 mg Oral Daily  . senna-docusate  2 tablet Oral BID  . sodium chloride flush  3 mL Intravenous Once   Continuous Infusions: PRN Meds:.acetaminophen **OR** [DISCONTINUED] acetaminophen (TYLENOL) oral liquid 160 mg/5 mL **OR** [DISCONTINUED] acetaminophen, guaiFENesin, LORazepam, mupirocin ointment, zolpidem  Diet Orders (From admission, onward)    Start     Ordered   07/15/20 0000  Diet - low sodium heart healthy        07/15/20 0923   07/11/20 1400  Diet Heart Room service appropriate? Yes; Fluid consistency: Thin  Diet effective now       Question Answer Comment  Room service appropriate? Yes   Fluid consistency: Thin      07/11/20 1400          DVT prophylaxis: enoxaparin (LOVENOX) injection 40 mg Start: 07/11/20 2200     Code Status: DNR  Family Communication: no family at bedside   Status is: Inpatient  Remains inpatient appropriate because:Unsafe d/c plan   Dispo: The patient is from: Home              Anticipated d/c is to: SNF              Anticipated d/c date is: 1 day              Patient currently is medically stable to d/c.  Consultants:  Neurology   Procedures:  EEG  Microbiology  None   Antimicrobials: None     Objective: Vitals:   07/16/20 2345 07/17/20 0336 07/17/20 0817 07/17/20 1120  BP: (!) 146/83 120/65 (!) 123/56 131/67  Pulse: 86 79 83 84  Resp: 17 17 16 18   Temp: 98.2 F (36.8 C) 97.7 F (36.5 C) 97.9 F (36.6 C) 98.2 F (36.8 C)  TempSrc: Oral Oral Oral Oral  SpO2: 91% 92% 96% 95%    Intake/Output Summary (Last 24 hours) at 07/17/2020 1410 Last data filed at 07/17/2020 0700 Gross per 24 hour  Intake 240 ml  Output 775 ml  Net -535 ml   There were no vitals filed for this visit.  Examination:  Constitutional: NAD Eyes: no scleral icterus ENMT: Mucous membranes are moist.  Neck: normal, supple Respiratory: clear to auscultation bilaterally, no wheezing, no crackles.  Cardiovascular:  Regular rate and rhythm, trace edema Abdomen: non distended, no tenderness. Bowel sounds positive.  Musculoskeletal: no clubbing / cyanosis.  Skin: no rashes Neurologic: non focal   Data Reviewed: I have independently reviewed following labs and imaging studies   CBC: Recent Labs  Lab 07/11/20 0914  WBC 7.1  NEUTROABS 5.5  HGB 13.2  HCT 40.3  MCV 96.0  PLT 637*   Basic Metabolic Panel: Recent Labs  Lab 07/11/20 0914 07/12/20 0729 07/13/20 0255 07/14/20 0307  NA 127* 131* 133* 134*  K 4.2 3.6 3.7 4.0  CL 92* 97* 101 102  CO2 26 24 24 23   GLUCOSE 112* 99 108* 108*  BUN 9 11 16 12   CREATININE 0.65 0.67 0.72 0.65  CALCIUM 10.2 9.4 8.7* 9.1   Liver Function Tests: Recent Labs  Lab 07/11/20 0914  AST 24  ALT 19  ALKPHOS 62  BILITOT 0.8  PROT 6.8  ALBUMIN 3.9   Coagulation Profile: Recent Labs  Lab 07/11/20 0914  INR 1.0   HbA1C: No results for input(s): HGBA1C in the last 72 hours. CBG: Recent Labs  Lab 07/11/20 0904  GLUCAP 101*    Recent Results (from the past 240 hour(s))  Urine culture     Status: Abnormal   Collection Time: 07/11/20 10:10 AM   Specimen: Urine, Random  Result Value Ref Range Status   Specimen Description URINE, RANDOM  Final   Special Requests   Final    NONE Performed at [facility], [place] [address], [place], [place] [address]    Culture MULTIPLE SPECIES PRESENT, SUGGEST RECOLLECTION (A)  Final   Report Status 07/12/2020 FINAL  Final  SARS Coronavirus 2 by RT PCR (hospital order, performed in [facility] hospital lab) Nasopharyngeal Nasopharyngeal Swab     Status: None   Collection Time: 07/11/20 12:35 PM   Specimen: Nasopharyngeal Swab  Result Value Ref Range Status   SARS Coronavirus 2 NEGATIVE NEGATIVE Final    Comment: (NOTE) SARS-CoV-2 target nucleic acids are NOT DETECTED.  The SARS-CoV-2 RNA is generally detectable in upper and lower respiratory specimens during the acute phase of infection. The  lowest concentration of SARS-CoV-2 viral copies this assay can detect is 250 copies / mL. A negative result does not preclude SARS-CoV-2 infection and should not be used as the sole basis for treatment or other patient management decisions.  A negative result may occur with improper specimen collection / handling, submission of specimen other than nasopharyngeal swab, presence of viral mutation(s) within the areas targeted by this assay, and inadequate number of viral copies (<250 copies / mL). A negative result must be combined with clinical observations, patient history, and epidemiological information.  Fact Sheet for Patients:   StrictlyIdeas.no  Fact Sheet for Healthcare Providers: BankingDealers.co.za  This test is not yet approved or  cleared by the [place] and has been authorized for detection and/or diagnosis of SARS-CoV-2 by FDA under an Emergency Use Authorization (EUA).  This EUA will remain in effect (meaning this test can be used) for the duration of the COVID-19 declaration under Section 564(b)(1) of the Act, 21 U.S.C. section 360bbb-3(b)(1), unless the authorization is terminated or revoked sooner.  Performed at [facility], [place] [address] [place], [place] [address]      Radiology Studies: No results found.  Marzetta Board, MD, PhD [facility]  Between 7 am - 7 pm I am available, please contact me via Amion or Securechat  Between 7 pm - 7 am I am not available, please contact night coverage MD/APP via Amion

## 2020-07-17 NOTE — TOC Progression Note (Signed)
Transition of Care [facility]) - Progression Note    Patient Details  Name: Sara Davila MRN: [id number] Date of Birth: May 14, 1930  Transition of Care [facility]) CM/SW [place], [place] Phone Number: 07/17/2020, 2:22 PM  Clinical Narrative:   CSW alerted by MD that peer to peer request for CIR was denied, patient will need SNF. CSW sent request to [facility] Medicare for SNF placement. Confirmed bed availability at [facility].  CSW to follow.    Expected Discharge Plan: [place] Barriers to Discharge: Ship broker, Continued Medical Work up  Expected Discharge Plan and Services Expected Discharge Plan: [place] arrangements for the past 2 months: [place] Expected Discharge Date: 07/15/20                                     Social Determinants of Health (SDOH) Interventions    Readmission Risk Interventions No flowsheet data found.

## 2020-07-17 NOTE — Progress Notes (Addendum)
Inpatient Rehab Admissions Coordinator:   1218: Faxed updated clinicals to [facility] per their request.  Will continue to follow.   Following for my colleague, who is off the remainder of the week.  Insurance opened on Monday, hope to hear back with a determination today.    Shann Medal, PT, DPT Admissions Coordinator [phone] 07/17/20  10:42 AM

## 2020-07-17 NOTE — Progress Notes (Signed)
Inpatient Rehab Admissions Coordinator:   Notified by acute CM that request for CIR was denied following peer to peer.  Pt will need to seek rehab in a lower level of care.  CIR will sign off at this time.   Shann Medal, PT, DPT Admissions Coordinator [phone] 07/17/20  3:05 PM

## 2020-07-17 NOTE — Progress Notes (Signed)
Physical Therapy Treatment Patient Details Name: Sara Davila MRN: [id number] DOB: 1930-04-05 Today's Date: 07/17/2020    History of Present Illness Pt is a 86 y/o female with PMH of TIA, hypothyroidism, neuropathy, anxiety, pneumonia, fibromyalgia, L femur fx s/p IM nail on 03/03/20, and insomnia who presented with complaints of L sided weakness. CT and MRI negative for acute abnormalities. 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     PT Comments    Pt progressing with gait and mobility.  We used the rollator today as it gave [place] a sitting option if she fatigued in the hallway without having to bring the recliner chair to follow.  She did not need a seated rest break and despite DOE 2/4 her VSS on RA.  Warm up exercises preformed to try to get her arthritic knees loosened up before walking.   PT will continue to follow acutely for safe mobility progression.   Follow Up Recommendations  CIR     Equipment Recommendations  None recommended by PT    Recommendations for Other Services       Precautions / Restrictions Precautions Precautions: Fall    Mobility  Bed Mobility Overal bed mobility: Needs Assistance         Sit to supine: Supervision   General bed mobility comments: Supervision for safety  Transfers Overall transfer level: Needs assistance Equipment used: 4-wheeled walker Transfers: Sit to/from Stand Sit to Stand: Min guard         General transfer comment: Min guard assist for safety, cues to lock rollator breaks during transitions.   Ambulation/Gait Ambulation/Gait assistance: Min guard Gait Distance (Feet): 200 Feet Assistive device: 4-wheeled walker Gait Pattern/deviations: Step-through pattern;Staggering left;Staggering right     General Gait Details: Pt with mildly  staggering gait pattern, still lists mildly to the left with support of rollator.  No seated rest breaks today likely because there were people in the hallway and pt did not want to stop while they were in the hall.  DOE 2/4, but no O2 needed.  VSS on RA.    Stairs             Wheelchair Mobility    Modified Rankin (Stroke Patients Only) Modified Rankin (Stroke Patients Only) Pre-Morbid Rankin Score: Moderate disability Modified Rankin: Moderately severe disability     Balance Overall balance assessment: Needs assistance Sitting-balance support: Feet supported;No upper extremity supported Sitting balance-Leahy Scale: Fair     Standing balance support: Bilateral upper extremity supported Standing balance-Leahy Scale: Poor Standing balance comment: needs support from RW.                             Cognition Arousal/Alertness: Awake/alert Behavior During Therapy: WFL for tasks assessed/performed Overall Cognitive Status:  (not specifically tested.  )                       Memory: Decreased short-term memory                Exercises General Exercises - Lower Extremity Ankle Circles/Pumps: AROM;Both;10 reps Long Arc Quad: AROM;Both;10 reps (as warm up prior to walking. )    General Comments        Pertinent Vitals/Pain Pain Assessment: Faces Faces Pain Scale: Hurts even more Pain Location: bil knees Pain Descriptors /  Indicators: Grimacing;Guarding Pain Intervention(s): Limited activity within patient's tolerance;Monitored during session;Repositioned;Other (comment) (requested voltaren gel from RN)    Home Living                      Prior Function            PT Goals (current goals can now be found in the care plan section) Acute Rehab PT Goals Patient Stated Goal: to get to rehab Progress towards PT goals: Progressing toward goals    Frequency    Min 3X/week      PT Plan Current plan remains appropriate     Co-evaluation              AM-PAC PT "6 Clicks" Mobility   Outcome Measure  Help needed turning from your back to your side while in a flat bed without using bedrails?: A Little Help needed moving from lying on your back to sitting on the side of a flat bed without using bedrails?: A Little Help needed moving to and from a bed to a chair (including a wheelchair)?: A Little Help needed standing up from a chair using your arms (e.g., wheelchair or bedside chair)?: A Little Help needed to walk in hospital room?: A Little Help needed climbing 3-5 steps with a railing? : A Little 6 Click Score: 18    End of Session   Activity Tolerance: Patient limited by fatigue Patient left: in bed;with call bell/phone within reach;with bed alarm set Nurse Communication: Mobility status;Patient requests pain meds;Other (comment) (requests voltaren gel) PT Visit Diagnosis: Other abnormalities of gait and mobility (R26.89)     Time: 3013-1438 PT Time Calculation (min) (ACUTE ONLY): 21 min  Charges:  $Gait Training: 8-22 mins                     Verdene Lennert, PT, DPT  Acute Rehabilitation [phone] pager [phone]) [phone] office

## 2020-07-18 ENCOUNTER — Inpatient Hospital Stay (HOSPITAL_COMMUNITY): Payer: Medicare Other

## 2020-07-18 MED ORDER — LEVETIRACETAM 750 MG PO TABS: 750.0000 mg | ORAL_TABLET | Freq: Two times a day (BID) | ORAL | 1 refills | Status: DC

## 2020-07-18 NOTE — Discharge Summary (Signed)
Physician Discharge Summary  JUPITER BOYS XBW:[id number] DOB: 1930/07/22 DOA: 07/11/2020  PCP: [name], MD  Admit date: 07/11/2020 Discharge date: 07/18/2020  Admitted From: ALF Disposition:  ALF  Recommendations for Outpatient Follow-up:  1. Follow up with PCP in 1-2 weeks  Home Health: PT Equipment/Devices: none  Discharge Condition: stable CODE STATUS: DNR Diet recommendation: regular  HPI: Per admitting MD, Sara Davila is a 86 y.o. female with medical history significant of TIA, hypothyroidism, GERD, neuropathy, anxiety, and insomnia who presented with complaints of complaints of left-sided weakness.  Symptoms first started yesterday afternoon at approximately 2 PM.  She was playing marbles and cards with friends when she reported having difficulty grasping the marbles and was having some difficulty seeing the numbers on the cards.  Noted associated symptoms of a severe frontal headache that has persisted, photophobia, some mild nausea, and chronically deals with insomnia.  At that time she did not get upset and reports that the weakness and visual changes resolved within 20 minutes.  However, they returned this morning for which she came to the emergency department for further evaluation.  She admits that this is the third time that something similar has happened.  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  Neurology consulted and followed patient while hospitalized.  She had no further seizure activity, will continue Keppra on discharge.  She has been continued to be very weak and deconditioned, physical therapy recommended inpatient rehab however insurance denied coverage for that. She will return to ALF with [facility] services.  Active Problems History of CVA-no acute CVA based on MRI.  She is on dual antiplatelet therapy, continue.  Continue  statin  Hyponatremia/hypokalemia-corrected  Hyperlipidemia-continue statin  Hypothyroidism-continue Synthroid  Discharge Instructions  Discharge Instructions    Ambulatory referral to Neurology   Complete by: As directed    An appointment is requested in approximately: 2 weeks   Diet - low sodium heart healthy   Complete by: As directed    Discharge instructions   Complete by: As directed    Please follow up with primary care in 7 days after discharge from inpatient rehab.   Increase activity slowly   Complete by: As directed    No wound care   Complete by: As directed      Allergies as of 07/18/2020      Reactions   Codeine Rash   Penicillins Rash   Has patient had a PCN reaction causing immediate rash, facial/tongue/throat swelling, SOB or lightheadedness with hypotension: No Has patient had a PCN reaction causing severe rash involving mucus membranes or skin necrosis: No Has patient had a PCN reaction that required hospitalization: No Has patient had a PCN reaction occurring within the last 10 years: No If all of the above answers are "NO", then may proceed with Cephalosporin use.   Levaquin [levofloxacin]       Medication List    TAKE these medications   amLODipine 2.5 MG tablet Commonly known as: NORVASC Take 1 tablet (2.5 mg total) by mouth daily.   aspirin 81 MG EC tablet Take 1 tablet (81 mg total) by mouth daily. Swallow whole.   atorvastatin 20 MG tablet Commonly known as: LIPITOR Take 1 tablet (20 mg total) by mouth daily.   CALCIUM 500 PO Take 1 tablet by mouth daily.   clopidogrel 75 MG tablet Commonly known as: PLAVIX Take 1 tablet (75 mg  total) by mouth daily for 21 days.   DentaGel 1.1 % Gel dental gel Generic drug: sodium fluoride Place 1 application onto teeth daily.   diclofenac Sodium 1 % Gel Commonly known as: VOLTAREN Apply topically in the morning and at bedtime. To knee   docusate sodium 100 MG capsule Commonly known as:  COLACE Take 100 mg by mouth daily as needed for mild constipation.   ibandronate 150 MG tablet Commonly known as: BONIVA Take 150 mg by mouth every 30 (thirty) days. Take in the morning with a full glass of water, on an empty stomach, and do not take anything else by mouth or lie down for the next 30 min.   ibuprofen 200 MG tablet Commonly known as: ADVIL Take 200 mg by mouth daily as needed for headache.   levETIRAcetam 750 MG tablet Commonly known as: KEPPRA Take 1 tablet (750 mg total) by mouth 2 (two) times daily.   levothyroxine 75 MCG tablet Commonly known as: SYNTHROID Take 1 tablet (75 mcg total) by mouth daily before breakfast.   LORazepam 0.5 MG tablet Commonly known as: Ativan Take 0.5 tablets (0.25 mg total) by mouth 2 (two) times daily as needed for anxiety.   melatonin 3 MG Tabs tablet Take 1 tablet (3 mg total) by mouth at bedtime for 15 days.   mupirocin ointment 2 % Commonly known as: BACTROBAN Apply 1 application topically daily as needed (nose sores).   omeprazole 20 MG capsule Commonly known as: PRILOSEC Take 20 mg by mouth daily as needed (indigestion.).   polyethylene glycol 17 g packet Commonly known as: MIRALAX / GLYCOLAX Take 17 g by mouth daily.   Prevagen 10 MG Caps Generic drug: Apoaequorin Take 10 mg by mouth daily.   saccharomyces boulardii 250 MG capsule Commonly known as: FLORASTOR Take 250 mg by mouth daily.   senna-docusate 8.6-50 MG tablet Commonly known as: Senokot-S Take 2 tablets by mouth 2 (two) times daily.   Theratears PF 0.25 % Soln Generic drug: Carboxymethylcellulose Sod PF Apply 1 drop to eye in the morning and at bedtime.   traMADol 50 MG tablet Commonly known as: ULTRAM Take 50 mg by mouth every 6 (six) hours as needed for moderate pain.   TYLENOL EXTRA STRENGTH PO Take 500 mg by mouth in the morning and at bedtime.   Vitamin D (Cholecalciferol) 50 MCG (2000 UT) Caps Take 1 tablet by mouth daily.   zolpidem  5 MG tablet Commonly known as: AMBIEN Take 0.5 tablets (2.5 mg total) by mouth at bedtime as needed for sleep. What changed: when to take this       Contact information for follow-up providers    [name], MD Follow up in 1 week(s).   Specialty: Internal Medicine Contact information: [place] [phone]            Contact information for after-discharge care    Destination    [facility] .   Service: Skilled Nursing Contact information: [place] [phone]                  Consultations:  Neurology   Procedures/Studies:  EEG  Result Date: 07/11/2020 Lora Havens, MD     07/11/2020  4:21 PM Patient Name: Sara Davila MRN: [id number] Epilepsy Attending: Lora Havens Referring Physician/Provider: Dr. Varney Baas Date: 07/11/2020 Duration: 24 mins Patient history: 86 year old female who presented with left-sided weakness  and left hemianopia.  EEG evaluate for seizures. Level of alertness: Awake AEDs during EEG study: None Technical aspects: This EEG study was done with scalp electrodes positioned according to the 10-20 International system of electrode placement. Electrical activity was acquired at a sampling rate of 500Hz  and reviewed with a high frequency filter of 70Hz  and a low frequency filter of 1Hz . EEG data were recorded continuously and digitally stored. Description: No posterior dominant rhythm was seen. EEG showed 8-9 z alpha activity in left hemisphere and continuous 3-5hz  theta-delta activity in right hemisphere which at times appear rhythmic and sharply contoured without definite evolution. Sharp waves were also seen in right frontal region.  Hyperventilation and photic stimulation were not performed.   ABNORMALITY - Sharp wave, right frontal region -Continuous slow, right hemisphere IMPRESSION: This study showed evidence of epileptogenicity  arising from right frontal region as well as cortical dysfunction in right hemisphere likely secondary to underlying structural abnormality, post-ictal state. Lora Havens   CT Code Stroke CTA Head W/WO contrast  Result Date: 07/11/2020 CLINICAL DATA:  Headache. Blurred vision. Left upper extremity weakness. EXAM: CT ANGIOGRAPHY HEAD AND NECK CT PERFUSION BRAIN TECHNIQUE: Multidetector CT imaging of the head and neck was performed using the standard protocol during bolus administration of intravenous contrast. Multiplanar CT image reconstructions and MIPs were obtained to evaluate the vascular anatomy. Carotid stenosis measurements (when applicable) are obtained utilizing NASCET criteria, using the distal internal carotid diameter as the denominator. Multiphase CT imaging of the brain was performed following IV bolus contrast injection. Subsequent parametric perfusion maps were calculated using RAPID software. CONTRAST:  138mL OMNIPAQUE IOHEXOL 350 MG/ML SOLN COMPARISON:  Head CT earlier same day. FINDINGS: CTA NECK FINDINGS Aortic arch: Aortic atherosclerosis and tortuosity. No aneurysm or dissection. Branching pattern is normal without origin stenosis. Right carotid system: Common carotid artery widely patent to the bifurcation. Carotid bifurcation is widely patent without soft or calcified plaque. Cervical ICA widely patent. Left carotid system: Common carotid artery widely patent to the bifurcation. Minimal calcified plaque at the carotid bifurcation but no stenosis. Cervical ICA widely patent. Vertebral arteries: Both vertebral artery origins are widely patent. Both vertebral arteries appeared widely patent and normal through the cervical region to the foramen magnum. Skeleton: Distant cervical fusion. Chronic degenerative anterolisthesis at C7-T1. Old minor superior endplate deformity at T6. Other neck: No mass or lymphadenopathy. Upper chest:  Scarring at the lung apices. No active process. Review of  the MIP images confirms the above findings CTA HEAD FINDINGS Anterior circulation: Both internal carotid arteries are widely patent through the skull base and siphon regions. No siphon stenosis. The anterior and middle cerebral vessels are normal without proximal stenosis, aneurysm or vascular malformation. No large or medium vessel occlusion. Posterior circulation: Both vertebral arteries are widely patent to the basilar. No basilar stenosis. Posterior circulation branch vessels appear normal. Venous sinuses: Patent and normal. Anatomic variants: None Review of the MIP images confirms the above findings CT Brain Perfusion Findings: ASPECTS: 10 CBF (<30%) Volume: 26mL Perfusion (Tmax>6.0s) volume: 44mL Mismatch Volume: 37mL Infarction Location:None IMPRESSION: 1. No large or medium vessel occlusion. Normal perfusion study. 2. Aortic atherosclerosis. 3. Minimal calcified plaque at the left carotid bifurcation but no stenosis. Aortic Atherosclerosis (ICD10-I70.0). Electronically Signed   By: Nelson Chimes M.D.   On: 07/11/2020 12:30   CT Angio Head W or Wo Contrast  Result Date: 06/19/2020 CLINICAL DATA:  Extremity weakness.  Stroke suspected. EXAM: CT ANGIOGRAPHY HEAD AND NECK TECHNIQUE: Multidetector  CT imaging of the head and neck was performed using the standard protocol during bolus administration of intravenous contrast. Multiplanar CT image reconstructions and MIPs were obtained to evaluate the vascular anatomy. Carotid stenosis measurements (when applicable) are obtained utilizing NASCET criteria, using the distal internal carotid diameter as the denominator. CONTRAST:  45mL OMNIPAQUE IOHEXOL 350 MG/ML SOLN COMPARISON:  CT head without contrast 06/19/2020 and 03/02/2020. FINDINGS: CTA NECK FINDINGS Aortic arch: Common origin of the left common carotid artery and innominate artery is noted. No significant calcifications are present at the aortic arch. No aneurysm or stenosis is present. Right carotid system:  The right common carotid artery is within normal limits. Bifurcation is unremarkable. Mild tortuosity is present cervical right ICA without significant stenosis. Left carotid system: The left common carotid artery is within normal limits. Minimal calcifications present bifurcation without significant stenosis. Mild tortuosity is present cervical left ICA without significant stenosis. Vertebral arteries: The vertebral arteries originate from the subclavian arteries bilaterally without significant stenosis. The left vertebral artery is the dominant vessel. No significant stenosis present in the neck. Skeleton: Cervical spine is fused C4-6 probable fusion is present at C6-7. Grade 2 anterolisthesis at C7-T1 measures 5 cm. Fusion is present at T1-2 and likely T2-3. Other neck: Soft tissues the neck are otherwise unremarkable. Upper chest: Scarring is noted at the lung apices bilaterally. Lungs are otherwise clear. Thoracic inlet is within normal limits. Review of the MIP images confirms the above findings CTA HEAD FINDINGS Anterior circulation: The internal carotid arteries are within normal limits through the ICA termini bilaterally. The A1 and M1 segments are normal. The A1 and M1 segments are normal. The anterior communicating artery is patent. The MCA bifurcations are intact bilaterally. ACA and MCA branch vessels are unremarkable. Posterior circulation: Left vertebral artery is dominant. PICA origins are visualized and normal. The vertebrobasilar junction is normal. Basilar artery is normal. Both posterior cerebral arteries originate from the basilar tip. The PCA branch vessels are within normal limits. Venous sinuses: The dural sinuses are patent. The straight sinus and deep cerebral veins are intact. Cortical veins are unremarkable. Anatomic variants: None Review of the MIP images confirms the above findings IMPRESSION: 1. No emergent large vessel occlusion. 2. Minimal atherosclerotic changes at the left carotid  bifurcation without significant stenosis. 3. Mild tortuosity of the cervical internal carotid arteries bilaterally without significant stenosis. 4. Normal variant Circle of Willis without significant proximal stenosis, aneurysm, or branch vessel occlusion. 5. Grade 2 anterolisthesis at C7-T1 measures 5 cm. The cervical spine is fused above this level. Electronically Signed   By: San Morelle M.D.   On: 06/19/2020 17:04   CT HEAD WO CONTRAST  Result Date: 07/11/2020 CLINICAL DATA:  TIA this morning, headache, blurry vision, left upper extremity weakness now resolved. No reported injury. EXAM: CT HEAD WITHOUT CONTRAST TECHNIQUE: Contiguous axial images were obtained from the base of the skull through the vertex without intravenous contrast. COMPARISON:  06/19/2020 head CT FINDINGS: Brain: No evidence of parenchymal hemorrhage or extra-axial fluid collection. No mass lesion, mass effect, or midline shift. No CT evidence of acute infarction. Generalized cerebral volume loss. Nonspecific moderate subcortical and periventricular white matter hypodensity, most in keeping with chronic small vessel ischemic change. No ventriculomegaly. Vascular: No acute abnormality. Skull: No evidence of calvarial fracture. Sinuses/Orbits: The visualized paranasal sinuses are essentially clear. Other:  The mastoid air cells are unopacified. IMPRESSION: 1. No evidence of acute intracranial abnormality. 2. Generalized cerebral volume loss and moderate chronic small  vessel ischemic changes in the cerebral white matter. Electronically Signed   By: Ilona Sorrel M.D.   On: 07/11/2020 09:50   CT Head Wo Contrast  Result Date: 06/19/2020 CLINICAL DATA:  Extremity weakness EXAM: CT HEAD WITHOUT CONTRAST TECHNIQUE: Contiguous axial images were obtained from the base of the skull through the vertex without intravenous contrast. COMPARISON:  CT 03/02/2020 FINDINGS: Brain: No evidence of acute infarction, hemorrhage, hydrocephalus,  extra-axial collection or mass lesion/mass effect. Symmetric prominence of the ventricles, cisterns and sulci compatible with parenchymal volume loss. Patchy areas of white matter hypoattenuation are most compatible with chronic microvascular angiopathy. Vascular: Atherosclerotic calcification of the carotid siphons. No hyperdense vessel. Skull: No calvarial fracture or suspicious osseous lesion. No scalp swelling or hematoma. Sinuses/Orbits: Paranasal sinuses and mastoid air cells are predominantly clear. Orbital structures are unremarkable aside from prior lens extractions. Other: None. IMPRESSION: 1. No acute intracranial findings. 2. Chronic microvascular angiopathy and parenchymal volume loss. Electronically Signed   By: Lovena Le M.D.   On: 06/19/2020 15:10   CT Code Stroke CTA Neck W/WO contrast  Result Date: 07/11/2020 CLINICAL DATA:  Headache. Blurred vision. Left upper extremity weakness. EXAM: CT ANGIOGRAPHY HEAD AND NECK CT PERFUSION BRAIN TECHNIQUE: Multidetector CT imaging of the head and neck was performed using the standard protocol during bolus administration of intravenous contrast. Multiplanar CT image reconstructions and MIPs were obtained to evaluate the vascular anatomy. Carotid stenosis measurements (when applicable) are obtained utilizing NASCET criteria, using the distal internal carotid diameter as the denominator. Multiphase CT imaging of the brain was performed following IV bolus contrast injection. Subsequent parametric perfusion maps were calculated using RAPID software. CONTRAST:  164mL OMNIPAQUE IOHEXOL 350 MG/ML SOLN COMPARISON:  Head CT earlier same day. FINDINGS: CTA NECK FINDINGS Aortic arch: Aortic atherosclerosis and tortuosity. No aneurysm or dissection. Branching pattern is normal without origin stenosis. Right carotid system: Common carotid artery widely patent to the bifurcation. Carotid bifurcation is widely patent without soft or calcified plaque. Cervical ICA  widely patent. Left carotid system: Common carotid artery widely patent to the bifurcation. Minimal calcified plaque at the carotid bifurcation but no stenosis. Cervical ICA widely patent. Vertebral arteries: Both vertebral artery origins are widely patent. Both vertebral arteries appeared widely patent and normal through the cervical region to the foramen magnum. Skeleton: Distant cervical fusion. Chronic degenerative anterolisthesis at C7-T1. Old minor superior endplate deformity at T6. Other neck: No mass or lymphadenopathy. Upper chest:  Scarring at the lung apices. No active process. Review of the MIP images confirms the above findings CTA HEAD FINDINGS Anterior circulation: Both internal carotid arteries are widely patent through the skull base and siphon regions. No siphon stenosis. The anterior and middle cerebral vessels are normal without proximal stenosis, aneurysm or vascular malformation. No large or medium vessel occlusion. Posterior circulation: Both vertebral arteries are widely patent to the basilar. No basilar stenosis. Posterior circulation branch vessels appear normal. Venous sinuses: Patent and normal. Anatomic variants: None Review of the MIP images confirms the above findings CT Brain Perfusion Findings: ASPECTS: 10 CBF (<30%) Volume: 15mL Perfusion (Tmax>6.0s) volume: 47mL Mismatch Volume: 67mL Infarction Location:None IMPRESSION: 1. No large or medium vessel occlusion. Normal perfusion study. 2. Aortic atherosclerosis. 3. Minimal calcified plaque at the left carotid bifurcation but no stenosis. Aortic Atherosclerosis (ICD10-I70.0). Electronically Signed   By: Nelson Chimes M.D.   On: 07/11/2020 12:30   CT Angio Neck W and/or Wo Contrast  Result Date: 06/19/2020 CLINICAL DATA:  Extremity weakness.  Stroke suspected. EXAM: CT ANGIOGRAPHY HEAD AND NECK TECHNIQUE: Multidetector CT imaging of the head and neck was performed using the standard protocol during bolus administration of intravenous  contrast. Multiplanar CT image reconstructions and MIPs were obtained to evaluate the vascular anatomy. Carotid stenosis measurements (when applicable) are obtained utilizing NASCET criteria, using the distal internal carotid diameter as the denominator. CONTRAST:  46mL OMNIPAQUE IOHEXOL 350 MG/ML SOLN COMPARISON:  CT head without contrast 06/19/2020 and 03/02/2020. FINDINGS: CTA NECK FINDINGS Aortic arch: Common origin of the left common carotid artery and innominate artery is noted. No significant calcifications are present at the aortic arch. No aneurysm or stenosis is present. Right carotid system: The right common carotid artery is within normal limits. Bifurcation is unremarkable. Mild tortuosity is present cervical right ICA without significant stenosis. Left carotid system: The left common carotid artery is within normal limits. Minimal calcifications present bifurcation without significant stenosis. Mild tortuosity is present cervical left ICA without significant stenosis. Vertebral arteries: The vertebral arteries originate from the subclavian arteries bilaterally without significant stenosis. The left vertebral artery is the dominant vessel. No significant stenosis present in the neck. Skeleton: Cervical spine is fused C4-6 probable fusion is present at C6-7. Grade 2 anterolisthesis at C7-T1 measures 5 cm. Fusion is present at T1-2 and likely T2-3. Other neck: Soft tissues the neck are otherwise unremarkable. Upper chest: Scarring is noted at the lung apices bilaterally. Lungs are otherwise clear. Thoracic inlet is within normal limits. Review of the MIP images confirms the above findings CTA HEAD FINDINGS Anterior circulation: The internal carotid arteries are within normal limits through the ICA termini bilaterally. The A1 and M1 segments are normal. The A1 and M1 segments are normal. The anterior communicating artery is patent. The MCA bifurcations are intact bilaterally. ACA and MCA branch vessels are  unremarkable. Posterior circulation: Left vertebral artery is dominant. PICA origins are visualized and normal. The vertebrobasilar junction is normal. Basilar artery is normal. Both posterior cerebral arteries originate from the basilar tip. The PCA branch vessels are within normal limits. Venous sinuses: The dural sinuses are patent. The straight sinus and deep cerebral veins are intact. Cortical veins are unremarkable. Anatomic variants: None Review of the MIP images confirms the above findings IMPRESSION: 1. No emergent large vessel occlusion. 2. Minimal atherosclerotic changes at the left carotid bifurcation without significant stenosis. 3. Mild tortuosity of the cervical internal carotid arteries bilaterally without significant stenosis. 4. Normal variant Circle of Willis without significant proximal stenosis, aneurysm, or branch vessel occlusion. 5. Grade 2 anterolisthesis at C7-T1 measures 5 cm. The cervical spine is fused above this level. Electronically Signed   By: San Morelle M.D.   On: 06/19/2020 17:04   MR BRAIN WO CONTRAST  Result Date: 06/19/2020 CLINICAL DATA:  Follow-up examination for acute stroke. EXAM: MRI HEAD WITHOUT CONTRAST TECHNIQUE: Multiplanar, multiecho pulse sequences of the brain and surrounding structures were obtained without intravenous contrast. COMPARISON:  Prior CTs from earlier the same day. FINDINGS: Brain: Cerebral volume within normal limits for age. Mild scattered T2/FLAIR hyperintensity seen involving the periventricular deep white matter both cerebral hemispheres, most like related chronic microvascular ischemic disease, fairly typical/normal for age. No abnormal foci of restricted diffusion to suggest acute or subacute ischemia. Gray-white matter differentiation maintained. No encephalomalacia to suggest chronic cortical infarction. No foci of susceptibility artifact to suggest acute or chronic intracranial hemorrhage. No mass lesion, midline shift or mass  effect. No hydrocephalus or extra-axial fluid collection. Pituitary gland suprasellar region normal.  Midline structures intact. Vascular: Major intracranial vascular flow voids are maintained. Skull and upper cervical spine: Degenerative osteoarthritic changes noted about the dens. Craniocervical junction otherwise unremarkable. Bone marrow signal intensity within normal limits. No scalp soft tissue abnormality. Sinuses/Orbits: Patient status post bilateral ocular lens replacement. Globes and orbital soft tissues demonstrate no acute finding. Paranasal sinuses are largely clear. No significant mastoid effusion. Inner ear structures grossly normal. Other: None. IMPRESSION: Normal brain MRI for age. No acute intracranial infarct or other abnormality. Electronically Signed   By: Jeannine Boga M.D.   On: 06/19/2020 20:22   MR BRAIN W WO CONTRAST  Result Date: 07/11/2020 CLINICAL DATA:  Left-sided weakness neglect EXAM: MRI HEAD WITHOUT AND WITH CONTRAST TECHNIQUE: Multiplanar, multiecho pulse sequences of the brain and surrounding structures were obtained without and with intravenous contrast. CONTRAST:  8mL GADAVIST GADOBUTROL 1 MMOL/ML IV SOLN COMPARISON:  06/19/2020 FINDINGS: Motion artifact is present. Brain: There is no acute infarction or intracranial hemorrhage. There is no intracranial mass, mass effect, or edema. There is no hydrocephalus or extra-axial fluid collection. Prominence of the ventricles and sulci reflects generalized parenchymal volume loss similar to the prior study. Patchy T2 hyperintensity in the supratentorial white matter is nonspecific but probably reflects stable chronic microvascular ischemic changes. There is no abnormal enhancement. Vascular: Major vessel flow voids at the skull base are preserved. Skull and upper cervical spine: Normal marrow signal is preserved. Sinuses/Orbits: Paranasal sinuses are aerated. Bilateral lens replacements. Other: Sella is unremarkable.  Mastoid  air cells are clear. IMPRESSION: Motion degraded. No acute intracranial abnormality. Stable chronic findings detailed above. Electronically Signed   By: Macy Mis M.D.   On: 07/11/2020 18:45   CT Code Stroke Cerebral Perfusion with contrast  Result Date: 07/11/2020 CLINICAL DATA:  Headache. Blurred vision. Left upper extremity weakness. EXAM: CT ANGIOGRAPHY HEAD AND NECK CT PERFUSION BRAIN TECHNIQUE: Multidetector CT imaging of the head and neck was performed using the standard protocol during bolus administration of intravenous contrast. Multiplanar CT image reconstructions and MIPs were obtained to evaluate the vascular anatomy. Carotid stenosis measurements (when applicable) are obtained utilizing NASCET criteria, using the distal internal carotid diameter as the denominator. Multiphase CT imaging of the brain was performed following IV bolus contrast injection. Subsequent parametric perfusion maps were calculated using RAPID software. CONTRAST:  116mL OMNIPAQUE IOHEXOL 350 MG/ML SOLN COMPARISON:  Head CT earlier same day. FINDINGS: CTA NECK FINDINGS Aortic arch: Aortic atherosclerosis and tortuosity. No aneurysm or dissection. Branching pattern is normal without origin stenosis. Right carotid system: Common carotid artery widely patent to the bifurcation. Carotid bifurcation is widely patent without soft or calcified plaque. Cervical ICA widely patent. Left carotid system: Common carotid artery widely patent to the bifurcation. Minimal calcified plaque at the carotid bifurcation but no stenosis. Cervical ICA widely patent. Vertebral arteries: Both vertebral artery origins are widely patent. Both vertebral arteries appeared widely patent and normal through the cervical region to the foramen magnum. Skeleton: Distant cervical fusion. Chronic degenerative anterolisthesis at C7-T1. Old minor superior endplate deformity at T6. Other neck: No mass or lymphadenopathy. Upper chest:  Scarring at the lung apices.  No active process. Review of the MIP images confirms the above findings CTA HEAD FINDINGS Anterior circulation: Both internal carotid arteries are widely patent through the skull base and siphon regions. No siphon stenosis. The anterior and middle cerebral vessels are normal without proximal stenosis, aneurysm or vascular malformation. No large or medium vessel occlusion. Posterior circulation: Both vertebral arteries are widely  patent to the basilar. No basilar stenosis. Posterior circulation branch vessels appear normal. Venous sinuses: Patent and normal. Anatomic variants: None Review of the MIP images confirms the above findings CT Brain Perfusion Findings: ASPECTS: 10 CBF (<30%) Volume: 51mL Perfusion (Tmax>6.0s) volume: 50mL Mismatch Volume: 50mL Infarction Location:None IMPRESSION: 1. No large or medium vessel occlusion. Normal perfusion study. 2. Aortic atherosclerosis. 3. Minimal calcified plaque at the left carotid bifurcation but no stenosis. Aortic Atherosclerosis (ICD10-I70.0). Electronically Signed   By: Nelson Chimes M.D.   On: 07/11/2020 12:30   DG CHEST PORT 1 VIEW  Result Date: 07/18/2020 CLINICAL DATA:  Cough EXAM: PORTABLE CHEST 1 VIEW COMPARISON:  03/02/2020 chest radiograph and prior. FINDINGS: Cardiomediastinal silhouette within normal limits. Aortic atherosclerotic calcifications. Stable appearance of right mid lung calcified granuloma. No focal consolidation. No pneumothorax or pleural effusion. Multilevel spondylosis. Partially imaged ACDF and lumbar fixation hardware. IMPRESSION: No acute airspace disease. Electronically Signed   By: Primitivo Gauze M.D.   On: 07/18/2020 09:13   ECHOCARDIOGRAM COMPLETE  Result Date: 06/20/2020    ECHOCARDIOGRAM REPORT   Patient Name:   Sara Davila Date of Exam: 06/20/2020 Medical Rec #:  [id number]      Height:       60.0 in Accession #:    [id number]     Weight:       91.7 lb Date of Birth:  1930/08/15     BSA:          1.340 m Patient Age:     22 years       BP:           135/80 mmHg Patient Gender: F              HR:           84 bpm. Exam Location:  Inpatient Procedure: 2D Echo, Cardiac Doppler and Color Doppler Indications:    Stroke 434.91 / I163.9  History:        Patient has no prior history of Echocardiogram examinations.                 TIA; Risk Factors:Hypertension and Former Smoker. GERD.  Sonographer:    Vickie Epley RDCS Referring Phys: [id number] [place]  1. Left ventricular ejection fraction, by estimation, is 60 to 65%. The left ventricle has normal function. The left ventricle has no regional wall motion abnormalities. There is mild left ventricular hypertrophy. Left ventricular diastolic parameters were normal.  2. Right ventricular systolic function is normal. The right ventricular size is normal. There is normal pulmonary artery systolic pressure. The estimated right ventricular systolic pressure is 09.2 mmHg.  3. The mitral valve is normal in structure. Significant calcification of subvalvular apparatus. No evidence of mitral valve regurgitation.  4. The aortic valve is tricuspid. Aortic valve regurgitation is trivial. No aortic stenosis is present.  5. The inferior vena cava is normal in size with <50% respiratory variability, suggesting right atrial pressure of 8 mmHg. FINDINGS  Left Ventricle: Left ventricular ejection fraction, by estimation, is 60 to 65%. The left ventricle has normal function. The left ventricle has no regional wall motion abnormalities. The left ventricular internal cavity size was small. There is mild left ventricular hypertrophy. Left ventricular diastolic parameters were normal. Right Ventricle: The right ventricular size is normal. Right vetricular wall thickness was not assessed. Right ventricular systolic function is normal. There is normal pulmonary artery systolic pressure. The tricuspid regurgitant velocity is 2.28 m/s, and  with an assumed right atrial pressure of 8 mmHg, the estimated  right ventricular systolic pressure is 61.6 mmHg. Left Atrium: Left atrial size was normal in size. Right Atrium: Right atrial size was normal in size. Pericardium: There is no evidence of pericardial effusion. Mitral Valve: The mitral valve is normal in structure. No evidence of mitral valve regurgitation. Tricuspid Valve: The tricuspid valve is normal in structure. Tricuspid valve regurgitation is trivial. Aortic Valve: The aortic valve is tricuspid. Aortic valve regurgitation is trivial. No aortic stenosis is present. Pulmonic Valve: The pulmonic valve was not well visualized. Pulmonic valve regurgitation is not visualized. Aorta: The aortic root is normal in size and structure. Venous: The inferior vena cava is normal in size with less than 50% respiratory variability, suggesting right atrial pressure of 8 mmHg. IAS/Shunts: The interatrial septum was not well visualized.  LEFT VENTRICLE PLAX 2D LVIDd:         3.30 cm     Diastology LVIDs:         2.30 cm     LV e' lateral:   6.20 cm/s LV PW:         0.70 cm     LV E/e' lateral: 10.2 LV IVS:        0.70 cm     LV e' medial:    5.11 cm/s LVOT diam:     1.80 cm     LV E/e' medial:  12.3 LV SV:         40 LV SV Index:   30 LVOT Area:     2.54 cm  LV Volumes (MOD) LV vol d, MOD A2C: 42.7 ml LV vol d, MOD A4C: 65.8 ml LV vol s, MOD A2C: 18.2 ml LV vol s, MOD A4C: 23.1 ml LV SV MOD A2C:     24.5 ml LV SV MOD A4C:     65.8 ml LV SV MOD BP:      33.4 ml RIGHT VENTRICLE RV S prime:     10.60 cm/s TAPSE (M-mode): 1.8 cm LEFT ATRIUM             Index      RIGHT ATRIUM          Index LA diam:        2.50 cm 1.87 cm/m RA Area:     6.17 cm LA Vol (A2C):   9.8 ml  7.31 ml/m RA Volume:   8.97 ml  6.69 ml/m LA Vol (A4C):   11.1 ml 8.28 ml/m LA Biplane Vol: 11.1 ml 8.28 ml/m  AORTIC VALVE LVOT Vmax:   73.80 cm/s LVOT Vmean:  55.200 cm/s LVOT VTI:    0.158 m  AORTA Ao Root diam: 3.10 cm MITRAL VALVE               TRICUSPID VALVE MV Area (PHT): 3.77 cm    TR Peak grad:    20.8 mmHg MV Decel Time: 201 msec    TR Vmax:        228.00 cm/s MV E velocity: 63.00 cm/s MV A velocity: 98.50 cm/s  SHUNTS MV E/A ratio:  0.64        Systemic VTI:  0.16 m                            Systemic Diam: 1.80 cm Oswaldo Milian MD Electronically signed by Oswaldo Milian MD Signature Date/Time: 06/20/2020/3:06:01 PM    Final  Subjective: - no chest pain, shortness of breath, no abdominal pain, nausea or vomiting.   Discharge Exam: BP 132/74 (BP Location: Right Arm)   Pulse 91   Temp 98.2 F (36.8 C)   Resp 18   SpO2 98%   General: Pt is alert, awake, not in acute distress Cardiovascular: RRR, S1/S2 +, no rubs, no gallops Respiratory: CTA bilaterally, no wheezing, no rhonchi Abdominal: Soft, NT, ND, bowel sounds + Extremities: no edema, no cyanosis   The results of significant diagnostics from this hospitalization (including imaging, microbiology, ancillary and laboratory) are listed below for reference.     Microbiology: Recent Results (from the past 240 hour(s))  Urine culture     Status: Abnormal   Collection Time: 07/11/20 10:10 AM   Specimen: Urine, Random  Result Value Ref Range Status   Specimen Description URINE, RANDOM  Final   Special Requests   Final    NONE Performed at [facility], [address], [place], [place] [address]    Culture MULTIPLE SPECIES PRESENT, SUGGEST RECOLLECTION (A)  Final   Report Status 07/12/2020 FINAL  Final  SARS Coronavirus 2 by RT PCR (hospital order, performed in [facility] hospital lab) Nasopharyngeal Nasopharyngeal Swab     Status: None   Collection Time: 07/11/20 12:35 PM   Specimen: Nasopharyngeal Swab  Result Value Ref Range Status   SARS Coronavirus 2 NEGATIVE NEGATIVE Final    Comment: (NOTE) SARS-CoV-2 target nucleic acids are NOT DETECTED.  The SARS-CoV-2 RNA is generally detectable in upper and lower respiratory specimens during the acute phase of infection. The lowest concentration of  SARS-CoV-2 viral copies this assay can detect is 250 copies / mL. A negative result does not preclude SARS-CoV-2 infection and should not be used as the sole basis for treatment or other patient management decisions.  A negative result may occur with improper specimen collection / handling, submission of specimen other than nasopharyngeal swab, presence of viral mutation(s) within the areas targeted by this assay, and inadequate number of viral copies (<250 copies / mL). A negative result must be combined with clinical observations, patient history, and epidemiological information.  Fact Sheet for Patients:   StrictlyIdeas.no  Fact Sheet for Healthcare Providers: BankingDealers.co.za  This test is not yet approved or  cleared by the [place] FDA and has been authorized for detection and/or diagnosis of SARS-CoV-2 by FDA under an Emergency Use Authorization (EUA).  This EUA will remain in effect (meaning this test can be used) for the duration of the COVID-19 declaration under Section 564(b)(1) of the Act, 21 U.S.C. section 360bbb-3(b)(1), unless the authorization is terminated or revoked sooner.  Performed at [facility], [place] [address], [place], [place] [address]   SARS Coronavirus 2 by RT PCR (hospital order, performed in [facility] hospital lab) Nasopharyngeal Nasopharyngeal Swab     Status: None   Collection Time: 07/17/20  2:54 PM   Specimen: Nasopharyngeal Swab  Result Value Ref Range Status   SARS Coronavirus 2 NEGATIVE NEGATIVE Final    Comment: (NOTE) SARS-CoV-2 target nucleic acids are NOT DETECTED.  The SARS-CoV-2 RNA is generally detectable in upper and lower respiratory specimens during the acute phase of infection. The lowest concentration of SARS-CoV-2 viral copies this assay can detect is 250 copies / mL. A negative result does not preclude SARS-CoV-2 infection and should not be used as the sole basis  for treatment or other patient management decisions.  A negative result may occur with  improper specimen collection / handling, submission of specimen other than nasopharyngeal swab, presence of viral mutation(s) within the areas targeted by this assay, and inadequate number of viral copies (<250 copies / mL). A negative result must be combined with clinical observations, patient history, and epidemiological information.  Fact Sheet for Patients:   StrictlyIdeas.no  Fact Sheet for Healthcare Providers: BankingDealers.co.za  This test is not yet approved or  cleared by the [place] FDA and has been authorized for detection and/or diagnosis of SARS-CoV-2 by FDA under an Emergency Use Authorization (EUA).  This EUA will remain in effect (meaning this test can be used) for the duration of the COVID-19 declaration under Section 564(b)(1) of the Act, 21 U.S.C. section 360bbb-3(b)(1), unless the authorization is terminated or revoked sooner.  Performed at [facility], [place] [address], [place], [place] [address]      Labs: Basic Metabolic Panel: Recent Labs  Lab 07/12/20 0729 07/13/20 0255 07/14/20 0307  NA 131* 133* 134*  K 3.6 3.7 4.0  CL 97* 101 102  CO2 24 24 23   GLUCOSE 99 108* 108*  BUN 11 16 12   CREATININE 0.67 0.72 0.65  CALCIUM 9.4 8.7* 9.1   Liver Function Tests: No results for input(s): AST, ALT, ALKPHOS, BILITOT, PROT, ALBUMIN in the last 168 hours. CBC: No results for input(s): WBC, NEUTROABS, HGB, HCT, MCV, PLT in the last 168 hours. CBG: No results for input(s): GLUCAP in the last 168 hours. Hgb A1c No results for input(s): HGBA1C in the last 72 hours. Lipid Profile No results for input(s): CHOL, HDL, LDLCALC, TRIG, CHOLHDL, LDLDIRECT in the last 72 hours. Thyroid function studies No results for input(s): TSH, T4TOTAL, T3FREE, THYROIDAB in the last 72 hours.  Invalid input(s): FREET3 Urinalysis     Component Value Date/Time   COLORURINE STRAW (A) 07/11/2020 1125   APPEARANCEUR CLEAR 07/11/2020 1125   LABSPEC 1.005 07/11/2020 1125   PHURINE 8.0 07/11/2020 1125   GLUCOSEU NEGATIVE 07/11/2020 1125   HGBUR NEGATIVE 07/11/2020 1125   BILIRUBINUR NEGATIVE 07/11/2020 1125   KETONESUR NEGATIVE 07/11/2020 1125   PROTEINUR NEGATIVE 07/11/2020 1125   NITRITE NEGATIVE 07/11/2020 1125   LEUKOCYTESUR NEGATIVE 07/11/2020 1125    FURTHER DISCHARGE INSTRUCTIONS:   Get Medicines reviewed and adjusted: Please take all your medications with you for your next visit with your Primary MD   Laboratory/radiological data: Please request your Primary MD to go over all hospital tests and procedure/radiological results at the follow up, please ask your Primary MD to get all Hospital records sent to his/her office.   In some cases, they will be blood work, cultures and biopsy results pending at the time of your discharge. Please request that your primary care M.D. goes through all the records of your hospital data and follows up on these results.   Also Note the following: If you experience worsening of your admission symptoms, develop shortness of breath, life threatening emergency, suicidal or homicidal thoughts you must seek medical attention immediately by calling 911 or calling your MD immediately  if symptoms less severe.   You must read complete instructions/literature along with all the possible adverse reactions/side effects for all the Medicines you take and that have been prescribed to you. Take any new Medicines after you have completely understood and accpet all the possible adverse reactions/side effects.    Do not drive when taking Pain medications or sleeping medications (Benzodaizepines)   Do not take more than prescribed Pain, Sleep and Anxiety Medications.  It is not advisable to combine anxiety,sleep and pain medications without talking with your primary care practitioner   Special  Instructions: If you have smoked or chewed Tobacco  in the last 2 yrs please stop smoking, stop any regular Alcohol  and or any Recreational drug use.   Wear Seat belts while driving.   Please note: You were cared for by a hospitalist during your hospital stay. Once you are discharged, your primary care physician will handle any further medical issues. Please note that NO REFILLS for any discharge medications will be authorized once you are discharged, as it is imperative that you return to your primary care physician (or establish a relationship with a primary care physician if you do not have one) for your post hospital discharge needs so that they can reassess your need for medications and monitor your lab values.  Time coordinating discharge: 35 minutes  SIGNED:  Marzetta Board, MD, PhD 07/18/2020, 1:48 PM

## 2020-07-18 NOTE — Progress Notes (Addendum)
Physical Therapy Treatment Patient Details Name: Sara Davila MRN: [id number] DOB: 1930/02/16 Today's Date: 07/18/2020    History of Present Illness Pt is a 86 y/o female with PMH of TIA, hypothyroidism, neuropathy, anxiety, pneumonia, fibromyalgia, L femur fx s/p IM nail on 03/03/20, and insomnia who presented with complaints of L sided weakness. CT and MRI negative for acute abnormalities. 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     PT Comments    Pt showing improvement in stability and activity tolerance from previous session. She was able to increase ambulation distance with no DOE noted. Pt able to recall her room number and find her room w/o assist. Performed LE ther ex prior to ambulation to improve pain and ROM on arthritic knees and L hip. D/c plan have been updated for pt to return home to ALF where she would benefit from continued skilled PT. Will continue to follow acutely.    Follow Up Recommendations  Home health PT (At ALF)     Equipment Recommendations  3in1 (PT)    Recommendations for Other Services       Precautions / Restrictions Precautions Precautions: Fall Restrictions Weight Bearing Restrictions: No    Mobility  Bed Mobility               General bed mobility comments: up in chair on arrival  Transfers Overall transfer level: Needs assistance Equipment used: 4-wheeled walker Transfers: Sit to/from Stand Sit to Stand: Min guard         General transfer comment: min guard for safety. Cues to keep rollator close for transfers  Ambulation/Gait Ambulation/Gait assistance: Min guard;Supervision Gait Distance (Feet): 550 Feet Assistive device: 4-wheeled walker Gait Pattern/deviations: Step-through pattern Gait velocity: decreased   General Gait Details: Pt overall  steady today with use of rollator. Initially min guard for safety, however progressed to supervision. No DOE noted this session. pt able to find her room w/o assist.   Stairs             Wheelchair Mobility    Modified Rankin (Stroke Patients Only) Modified Rankin (Stroke Patients Only) Pre-Morbid Rankin Score: Moderate disability Modified Rankin: Moderate disability     Balance Overall balance assessment: Needs assistance Sitting-balance support: Feet supported;No upper extremity supported Sitting balance-Leahy Scale: Fair     Standing balance support: Bilateral upper extremity supported Standing balance-Leahy Scale: Poor Standing balance comment: needs support from RW.                             Cognition Arousal/Alertness: Awake/alert Behavior During Therapy: WFL for tasks assessed/performed Overall Cognitive Status: Within Functional Limits for tasks assessed                                 General Comments: Pt able to recall room number and find room w/o assist. She follows commands consistantly and asks appropriate questions in reguards to d/c.      Exercises General Exercises - Lower Extremity Long Arc Quad: AROM;Both;10 reps;Seated (as warm up prior to walking. ) Hip Flexion/Marching: AROM;Both;10 reps;Seated;Standing (as warm up prior to walking. )    General Comments        Pertinent Vitals/Pain Pain Assessment: Faces Faces Pain Scale: Hurts little more Pain Location: bil  knees, L hip Pain Descriptors / Indicators: Grimacing;Guarding Pain Intervention(s): Monitored during session;Limited activity within patient's tolerance    Home Living                      Prior Function            PT Goals (current goals can now be found in the care plan section) Acute Rehab PT Goals Patient Stated Goal: to go home to [facility] PT Goal Formulation: With patient Time For Goal Achievement: 07/26/20 Potential to  Achieve Goals: Good Progress towards PT goals: Progressing toward goals    Frequency    Min 3X/week      PT Plan Discharge plan needs to be updated;Equipment recommendations need to be updated    Co-evaluation              AM-PAC PT "6 Clicks" Mobility   Outcome Measure  Help needed turning from your back to your side while in a flat bed without using bedrails?: A Little Help needed moving from lying on your back to sitting on the side of a flat bed without using bedrails?: A Little Help needed moving to and from a bed to a chair (including a wheelchair)?: A Little Help needed standing up from a chair using your arms (e.g., wheelchair or bedside chair)?: A Little Help needed to walk in hospital room?: A Little Help needed climbing 3-5 steps with a railing? : A Little 6 Click Score: 18    End of Session Equipment Utilized During Treatment: Gait belt Activity Tolerance: Patient tolerated treatment well Patient left: with call bell/phone within reach;in chair;with chair alarm set Nurse Communication: Mobility status PT Visit Diagnosis: Other abnormalities of gait and mobility (R26.89)     Time: 3546-5681 PT Time Calculation (min) (ACUTE ONLY): 29 min  Charges:  $Gait Training: 8-22 mins $Therapeutic Exercise: 8-22 mins                     Benjiman Core, [place] Pager [phone] Acute Rehab   Allena Katz 07/18/2020, 12:29 PM

## 2020-07-18 NOTE — TOC Transition Note (Signed)
Transition of Care [facility]) - CM/SW Discharge Note   Patient Details  Name: Sara Davila MRN: [id number] Date of Birth: 1930/05/18  Transition of Care [facility]) CM/SW Contact:  Geralynn Ochs, LCSW Phone Number: 07/18/2020, 2:30 PM   Clinical Narrative:   CSW met with patient per patient request, she said that she feels back to herself and wants to go back to her ALF with home health through [facility]. PT in agreement. CSW alerted [facility], they are able to accommodate home health for the patient. Patient says she has a friend who can provide transport, and she said she would call and update her son.   CSW sent home health and 3N1 order to [facility], they will take care of those for the patient.  Nurse to call report to [phone].    Final next level of care: Assisted Living Barriers to Discharge: Barriers Resolved   Patient Goals and CMS Choice Patient states their goals for this hospitalization and ongoing recovery are:: patient now oriented, choosing to return to ALF with home heatlh CMS Medicare.gov Compare Post Acute Care list provided to:: Patient Choice offered to / list presented to : Patient  Discharge Placement              Patient chooses bed at: [facility] Patient to be transferred to facility by: Private vehicle Name of family member notified: Self Patient and family notified of of transfer: 07/18/20  Discharge Plan and Services                DME Arranged: 3-N-1 DME Agency: NA       HH Arranged: PT, OT, Speech Therapy [place] Agency: Other - See comment Secondary school teacher)        Social Determinants of Health (SDOH) Interventions     Readmission Risk Interventions No flowsheet data found.

## 2020-07-18 NOTE — Plan of Care (Signed)
Adequate for discharge.

## 2020-07-18 NOTE — Progress Notes (Signed)
Called report to ALF, patient transported by private car, friend. D/C paper work and prescriptions with patient at D/C

## 2020-07-18 NOTE — NC FL2 (Signed)
[place] LEVEL OF CARE SCREENING TOOL     IDENTIFICATION  Patient Name: Sara Davila Birthdate: Apr 08, 1930 Sex: female Admission Date (Current Location): 07/11/2020  [facility] and [place] Number:  Herbalist and Address:  [facility] [place] [place] [address] [place], [place] [address]      Provider Number: [id number]  Attending Physician Name and Address:  Caren Griffins, MD  Relative Name and Phone Number:       Current Level of Care: Hospital Recommended Level of Care: [place], [place] Prior Approval Number:    Date Approved/Denied:   PASRR Number: [id number]  Discharge Plan: Other (Comment) (ALF)    Current Diagnoses: Patient Active Problem List   Diagnosis Date Noted  . Seizure ([place]) 07/12/2020  . Left-sided weakness 07/11/2020  . History of TIA (transient ischemic attack) 07/11/2020  . TIA (transient ischemic attack) 06/20/2020  . CVA (cerebral vascular accident) ([place]) 06/19/2020  . Asymptomatic bacteriuria 06/19/2020  . HTN (hypertension), benign 06/19/2020  . Thrombocytosis ([place]) 06/19/2020  . Left hip pain 04/15/2020  . Left knee pain 03/20/2020  . Slow transit constipation 03/20/2020  . Left foot pain 03/18/2020  . Urinary retention 03/06/2020  . Blood loss anemia 03/06/2020  . Displaced intertrochanteric fracture of left femur, initial encounter for closed fracture ([place]) 03/03/2020  . Prediabetes 01/04/2020  . Memory deficit 01/04/2020  . Osteoporosis 01/04/2020  . History of vasculitis 01/04/2020  . Venous insufficiency 01/04/2020  . Neuropathy 03/30/2018  . Hyponatremia 07/21/2017  . Insomnia secondary to anxiety 07/21/2017  . GERD (gastroesophageal reflux disease) 07/21/2017  . S/P small bowel resection 07/09/2017  . Hypothyroidism 02/26/2017  . Paresthesias 09/06/2016  . Fall 03/29/2012    Orientation RESPIRATION BLADDER Height & Weight     Self, Time,  Situation, Place  Normal Incontinent Weight:   Height:     BEHAVIORAL SYMPTOMS/MOOD NEUROLOGICAL BOWEL NUTRITION STATUS      Continent Diet (see DC summary)  AMBULATORY STATUS COMMUNICATION OF NEEDS Skin   Limited Assist Verbally Normal                       Personal Care Assistance Level of Assistance  Bathing, Feeding, Dressing Bathing Assistance: Limited assistance Feeding assistance: Independent Dressing Assistance: Limited assistance     Functional Limitations Info             SPECIAL CARE FACTORS FREQUENCY  PT (By licensed PT), OT (By licensed OT)     PT Frequency: 3x/wk with home health OT Frequency: 3x/wk with home health            Contractures Contractures Info: Not present    Additional Factors Info  Code Status, Allergies Code Status Info: DNR Allergies Info: Codeine, Penicillins, Levaquin           Current Medications (07/18/2020):  This is the current hospital active medication list Current Facility-Administered Medications  Medication Dose Route Frequency Provider Last Rate Last Admin  . acetaminophen (TYLENOL) tablet 650 mg  650 mg Oral Q4H PRN Fuller Plan A, MD   650 mg at 07/16/20 1527  . alum & mag hydroxide-simeth (MAALOX/MYLANTA) 200-200-20 MG/5ML suspension 30 mL  30 mL Oral Q4H PRN Caren Griffins, MD      . aspirin EC tablet 81 mg  81 mg Oral Daily Fuller Plan A, MD   81 mg at 07/18/20 0917  . atorvastatin (LIPITOR) tablet 20 mg  20  mg Oral Daily Fuller Plan A, MD   20 mg at 07/18/20 0917  . clopidogrel (PLAVIX) tablet 75 mg  75 mg Oral Daily Fuller Plan A, MD   75 mg at 07/18/20 0917  . diclofenac Sodium (VOLTAREN) 1 % topical gel 2 g  2 g Topical QID Arrien, Jimmy Picket, MD   2 g at 07/18/20 0917  . enoxaparin (LOVENOX) injection 40 mg  40 mg Subcutaneous Q24H Fuller Plan A, MD   40 mg at 07/17/20 2137  . guaiFENesin (ROBITUSSIN) 100 MG/5ML solution 100 mg  5 mL Oral Q4H PRN Arrien, Jimmy Picket, MD   100 mg  at 07/17/20 1242  . levETIRAcetam (KEPPRA) tablet 750 mg  750 mg Oral BID Tawni Millers, MD   750 mg at 07/18/20 0917  . levothyroxine (SYNTHROID) tablet 75 mcg  75 mcg Oral QAC breakfast Fuller Plan A, MD   75 mcg at 07/18/20 0544  . LORazepam (ATIVAN) tablet 0.25 mg  0.25 mg Oral BID PRN Fuller Plan A, MD   0.25 mg at 07/17/20 1625  . melatonin tablet 3 mg  3 mg Oral QHS Raulkar, Clide Deutscher, MD   3 mg at 07/16/20 2131  . mupirocin ointment (BACTROBAN) 2 % 1 application  1 application Topical Daily PRN Norval Morton, MD   1 application at 71/16/57 2310  . pantoprazole (PROTONIX) EC tablet 40 mg  40 mg Oral Daily Fuller Plan A, MD   40 mg at 07/18/20 0917  . polyethylene glycol (MIRALAX / GLYCOLAX) packet 17 g  17 g Oral Daily Fuller Plan A, MD   17 g at 07/17/20 0851  . saccharomyces boulardii (FLORASTOR) capsule 250 mg  250 mg Oral Daily Fuller Plan A, MD   250 mg at 07/18/20 0917  . senna-docusate (Senokot-S) tablet 2 tablet  2 tablet Oral BID Norval Morton, MD   2 tablet at 07/17/20 2136  . sodium chloride flush (NS) 0.9 % injection 3 mL  3 mL Intravenous Once Pattricia Boss, MD      . zolpidem (AMBIEN) tablet 2.5 mg  2.5 mg Oral BID BM & HS PRN Fuller Plan A, MD   2.5 mg at 07/17/20 2136     Discharge Medications: Please see discharge summary for a list of discharge medications.  Relevant Imaging Results:  Relevant Lab Results:   Additional Information SS#: [id number]  Geralynn Ochs, LCSW

## 2020-07-18 NOTE — Progress Notes (Signed)
  Speech Language Pathology Treatment: Cognitive-Linquistic  Patient Details Name: Sara Davila MRN: [id number] DOB: 1930-03-13 Today's Date: 07/18/2020 Time: 3343-5686 SLP Time Calculation (min) (ACUTE ONLY): 29 min  Assessment / Plan / Recommendation Clinical Impression  Pt was seen for cognitive-linguistic treatment. She reported that she believes her cognition has improved since the initial evaluation and expressed how embrassed she was by her performance on completion of the clock during the initial evaluation. She achieved 100% accuracy with time management problems. She completed mental manipulation tasks with 60% accuracy increasing to 100% with cues. She required intermittent cues for task order during sequencing tasks. She was educated regarding use of internal memory aids and verbalized understanding regarding them. She achieved 60% accuracy with recall of concrete information from voicemails increasing to 100% with cues. SLP will continue to follow pt.    HPI HPI: Pt is a 86 y/o female with PMH of TIA, hypothyroidism, neuropathy, anxiety, pneumonia, fibromyalgia, L femur fx s/p IM nail on 03/03/20, and insomnia who presented with complaints of L sided weakness. CT and MRI negative for acute abnormalities. 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      SLP Plan  Continue with current plan of care       Recommendations                   Follow up Recommendations: Home health SLP SLP Visit Diagnosis: Cognitive communication deficit (H68.372) Plan: Continue with current plan of care       Issac Moure I. Hardin Negus, [place], [place] Office number [phone] Pager [place] 07/18/2020, 1:46 PM

## 2020-07-19 ENCOUNTER — Non-Acute Institutional Stay: Payer: Medicare Other | Admitting: Internal Medicine

## 2020-07-19 ENCOUNTER — Encounter: Payer: Self-pay | Admitting: Neurology

## 2020-07-19 ENCOUNTER — Encounter: Payer: Self-pay | Admitting: Internal Medicine

## 2020-07-19 DIAGNOSIS — R569 Unspecified convulsions: Secondary | ICD-10-CM

## 2020-07-19 DIAGNOSIS — G459 Transient cerebral ischemic attack, unspecified: Secondary | ICD-10-CM | POA: Diagnosis not present

## 2020-07-19 DIAGNOSIS — E032 Hypothyroidism due to medicaments and other exogenous substances: Secondary | ICD-10-CM

## 2020-07-19 DIAGNOSIS — M8000XS Age-related osteoporosis with current pathological fracture, unspecified site, sequela: Secondary | ICD-10-CM

## 2020-07-19 DIAGNOSIS — I10 Essential (primary) hypertension: Secondary | ICD-10-CM | POA: Diagnosis not present

## 2020-07-19 DIAGNOSIS — M25552 Pain in left hip: Secondary | ICD-10-CM

## 2020-07-19 DIAGNOSIS — E871 Hypo-osmolality and hyponatremia: Secondary | ICD-10-CM

## 2020-07-19 NOTE — Progress Notes (Signed)
Location: [facility] Room Number: 906 Place of Service:  ALF (13)  Provider:   Code Status:  Goals of Care:  Advanced Directives 07/11/2020  Does Patient Have a Medical Advance Directive? Yes  Type of Advance Directive Out of facility DNR (pink MOST or yellow form)  Does patient want to make changes to medical advance directive? No - Guardian declined  Copy of [place] in Chart? -  Would patient like information on creating a medical advance directive? -  Pre-existing out of facility DNR order (yellow form or pink MOST form) -     Chief Complaint  Patient presents with  . Readmit To SNF    Readmission    HPI: Patient is a 86 y.o. female seen today for an acute visit for readmission Patient was initially admitted from 7/21-7/22 for questionable TIA with ?Left sided weakness She was discharged on dual therapy for 3 weeks and then on aspirin. She was again admitted from 8/12 -8/19 new onset seizures  This admission present patient states that she was playing cards and realized that she could not see the numbers.  Later that day when she told the nurse the nurse noticed the left-sided weakness again and tender to the hospital. She had extensive work-up with CT and MRI which did not show any acute CVA.  But the EEG showed a focal seizure.  In the right frontal region. Was seen by neurology who started her on Keppra.  It was recommended that patient should have a LP done but at this time she refused and wanted to wait.  Patient is doing well in [place]. She did not have any complaints.  But while talking to her patient suddenly said that she is having hallucinations in which she sees a woman in her room.  She states that this has happened to her before.  And we got resolved when she stopped her medication.  Patient wanted to know if it is because of [place]. Patient did not have any focal complaints was walking with the walker was alert and  oriented and did not have any fever.  She is having some cough which seems chronic but no shortness of breath Past Medical History:  Diagnosis Date  . Abdominal pain 02/26/2017  . Anxiety   . Arthritis    Left Knee, Wrist, Back and Hips  . Cervical vertebral fusion   . Diverticulitis   . Diverticulosis   . Fibromyalgia   . GERD (gastroesophageal reflux disease)   . Gout 02/2018   L hand  . Hypothyroidism   . Pelvis fracture ([place])   . Peritoneal free air 03/29/2012  . Pneumonia   . Pneumoperitoneum 02/26/2017  . Thyroid disorder   . TIA (transient ischemic attack)   . Toe pain 09/06/2016    Past Surgical History:  Procedure Laterality Date  . ABDOMINAL HYSTERECTOMY    . BOWEL RESECTION N/A 07/09/2017   Procedure: SMALL BOWEL RESECTION;  Surgeon: Johnathan Hausen, MD;  Location: [facility];  Service: General;  Laterality: N/A;  . CERVICAL FUSION     x2  . FEMUR IM NAIL Left 03/03/2020   Procedure: INTRAMEDULLARY (IM) NAIL FEMORAL;  Surgeon: Paralee Cancel, MD;  Location: [facility];  Service: Orthopedics;  Laterality: Left;  . LAPAROSCOPIC APPENDECTOMY N/A 03/04/2017   Procedure: APPENDECTOMY LAPAROSCOPIC;  Surgeon: Johnathan Hausen, MD;  Location: [facility];  Service: General;  Laterality: N/A;  . LAPAROSCOPY N/A 03/04/2017   Procedure: LAPAROSCOPY DIAGNOSTIC, ENTEROLYSIS;  Surgeon: Johnathan Hausen, MD;  Location: [facility];  Service: General;  Laterality: N/A;  . LAPAROTOMY N/A 07/09/2017   Procedure: EXPLORATORY LAPAROTOMY;  Surgeon: Johnathan Hausen, MD;  Location: [facility];  Service: General;  Laterality: N/A;  . SPINE SURGERY     Lumbar- rod placement  . TONSILLECTOMY     86y/o  . TOTAL VAGINAL HYSTERECTOMY      Allergies  Allergen Reactions  . Codeine Rash  . Penicillins Rash    Has patient had a PCN reaction causing immediate rash, facial/tongue/throat swelling, SOB or lightheadedness with hypotension: No Has patient had a PCN reaction causing severe rash involving mucus membranes or skin  necrosis: No Has patient had a PCN reaction that required hospitalization: No Has patient had a PCN reaction occurring within the last 10 years: No If all of the above answers are "NO", then may proceed with Cephalosporin use.   [place] [Levofloxacin]     Outpatient Encounter Medications as of 07/19/2020  Medication Sig  . Acetaminophen (TYLENOL EXTRA STRENGTH PO) Take 500 mg by mouth in the morning and at bedtime.   Marland Kitchen amLODipine (NORVASC) 2.5 MG tablet Take 1 tablet (2.5 mg total) by mouth daily.  Marland Kitchen Apoaequorin (PREVAGEN) 10 MG CAPS Take 10 mg by mouth daily.  Marland Kitchen aspirin EC 81 MG EC tablet Take 1 tablet (81 mg total) by mouth daily. Swallow whole.  Marland Kitchen atorvastatin (LIPITOR) 20 MG tablet Take 1 tablet (20 mg total) by mouth daily.  . Calcium Carbonate (CALCIUM 500 PO) Take 1 tablet by mouth daily.  . Carboxymethylcellulose Sod PF (THERATEARS PF) 0.25 % SOLN Apply 1 drop to eye in the morning and at bedtime.  . clopidogrel (PLAVIX) 75 MG tablet Take 1 tablet (75 mg total) by mouth daily for 21 days.  . diclofenac Sodium (VOLTAREN) 1 % GEL Apply topically in the morning and at bedtime. To knee   . docusate sodium (COLACE) 100 MG capsule Take 100 mg by mouth daily as needed for mild constipation.  . ibandronate (BONIVA) 150 MG tablet Take 150 mg by mouth every 30 (thirty) days. Take in the morning with a full glass of water, on an empty stomach, and do not take anything else by mouth or lie down for the next 30 min.  Marland Kitchen ibuprofen (ADVIL) 200 MG tablet Take 200 mg by mouth daily as needed for headache.   . levETIRAcetam (KEPPRA) 750 MG tablet Take 1 tablet (750 mg total) by mouth 2 (two) times daily.  Marland Kitchen levothyroxine (SYNTHROID) 75 MCG tablet Take 1 tablet (75 mcg total) by mouth daily before breakfast.  . LORazepam (ATIVAN) 0.5 MG tablet Take 0.5 tablets (0.25 mg total) by mouth 2 (two) times daily as needed for anxiety.  . melatonin 3 MG TABS tablet Take 1 tablet (3 mg total) by mouth at  bedtime for 15 days.  . mupirocin ointment (BACTROBAN) 2 % Apply 1 application topically daily as needed (nose sores).   Marland Kitchen omeprazole (PRILOSEC) 20 MG capsule Take 20 mg by mouth daily as needed (indigestion.).   Marland Kitchen polyethylene glycol (MIRALAX / GLYCOLAX) 17 g packet Take 17 g by mouth daily.   Marland Kitchen saccharomyces boulardii (FLORASTOR) 250 MG capsule Take 250 mg by mouth daily.   Marland Kitchen senna-docusate (SENOKOT-S) 8.6-50 MG tablet Take 2 tablets by mouth 2 (two) times daily.  . sodium fluoride (DENTAGEL) 1.1 % GEL dental gel Place 1 application onto teeth daily.  . traMADol (ULTRAM) 50 MG tablet Take 50 mg by  mouth every 6 (six) hours as needed for moderate pain.  . Vitamin D, Cholecalciferol, 50 MCG (2000 UT) CAPS Take 1 tablet by mouth daily.  Marland Kitchen zolpidem (AMBIEN) 5 MG tablet Take 0.5 tablets (2.5 mg total) by mouth at bedtime as needed for sleep. (Patient taking differently: Take 2.5 mg by mouth at bedtime. )   No facility-administered encounter medications on file as of 07/19/2020.    Review of Systems:  Review of Systems  Constitutional: Positive for activity change.  HENT: Negative.   Respiratory: Positive for cough.   Cardiovascular: Negative.   Gastrointestinal: Negative.   Genitourinary: Negative.   Musculoskeletal: Negative.   Skin: Negative.   Neurological: Positive for weakness.  Psychiatric/Behavioral: Positive for hallucinations and sleep disturbance.  All other systems reviewed and are negative.   Health Maintenance  Topic Date Due  . TETANUS/TDAP  Never done  . DEXA SCAN  Never done  . PNA vac Low Risk Adult (1 of 2 - PCV13) Never done  . INFLUENZA VACCINE  06/30/2020  . COVID-19 Vaccine  Completed    Physical Exam: Vitals:   07/19/20 1509  BP: 122/68  Pulse: 93  Temp: 98.4 F (36.9 C)  SpO2: 94%  Weight: 93 lb 6.4 oz (42.4 kg)  Height: 5' (1.524 m)   Body mass index is 18.24 kg/m. Physical Exam Vitals reviewed.  Constitutional:      Appearance: Normal  appearance.  HENT:     Head: Normocephalic.     Nose: Nose normal.     Mouth/Throat:     Mouth: Mucous membranes are moist.     Pharynx: Oropharynx is clear.  Eyes:     Pupils: Pupils are equal, round, and reactive to light.  Cardiovascular:     Rate and Rhythm: Normal rate and regular rhythm.     Pulses: Normal pulses.  Pulmonary:     Effort: Pulmonary effort is normal.     Breath sounds: Normal breath sounds.     Comments: Positive Rales in right lower lobe Abdominal:     General: Abdomen is flat. Bowel sounds are normal.     Palpations: Abdomen is soft.  Musculoskeletal:        General: No swelling.     Cervical back: Neck supple.  Skin:    General: Skin is warm and dry.  Neurological:     General: No focal deficit present.     Mental Status: She is alert and oriented to person, place, and time.  Psychiatric:        Mood and Affect: Mood normal.        Thought Content: Thought content normal.     Labs reviewed: Basic Metabolic Panel: Recent Labs    03/08/20 0000 03/20/20 0000 04/17/20 0000 06/13/20 0700 06/19/20 2151 06/20/20 0421 07/12/20 0729 07/13/20 0255 07/14/20 0307  NA 133*   < > 130*   < >  --    < > 131* 133* 134*  K 4.1   < > 4.5   < >  --    < > 3.6 3.7 4.0  CL 101   < > 96*   < >  --    < > 97* 101 102  CO2 27*   < > 27*   < >  --    < > 24 24 23   GLUCOSE  --   --   --    < >  --    < > 99 108* 108*  BUN  9   < > 10   < >  --    < > 11 16 12   CREATININE 0.6   < > 0.9   < > 0.65   < > 0.67 0.72 0.65  CALCIUM 8.1*   < > 9.5   < >  --    < > 9.4 8.7* 9.1  TSH 2.61  --  1.80  --  2.389  --   --   --   --    < > = values in this interval not displayed.   Liver Function Tests: Recent Labs    04/17/20 0000 04/17/20 0000 06/13/20 0700 06/19/20 1248 07/11/20 0914  AST 18   < > 19 24 24   ALT 10   < > 13 14 19   ALKPHOS 72  --   --  55 62  BILITOT  --   --  0.6 0.6 0.8  PROT  --   --  6.6 6.9 6.8  ALBUMIN 3.7  --   --  3.7 3.9   < > = values in  this interval not displayed.   No results for input(s): LIPASE, AMYLASE in the last 8760 hours. No results for input(s): AMMONIA in the last 8760 hours. CBC: Recent Labs    06/19/20 1248 06/19/20 1248 06/19/20 2151 06/20/20 0421 07/11/20 0914  WBC 7.1   < > 6.0 6.3 7.1  NEUTROABS 4.6  --   --  3.6 5.5  HGB 12.7   < > 12.6 12.0 13.2  HCT 39.4   < > 38.9 36.6 40.3  MCV 98.5   < > 97.5 95.8 96.0  PLT 471*   < > 418* 397 521*   < > = values in this interval not displayed.   Lipid Panel: Recent Labs    06/13/20 0700 06/20/20 0421  CHOL 181 171  HDL 67 59  LDLCALC 93 94  TRIG 117 89  CHOLHDL 2.7 2.9   Lab Results  Component Value Date   HGBA1C 6.1 (H) 06/20/2020    Procedures since last visit: EEG  Result Date: 07/11/2020 Lora Havens, MD     07/11/2020  4:21 PM Patient Name: Sara Davila MRN: [id number] Epilepsy Attending: Lora Havens Referring Physician/Provider: Dr. Varney Baas Date: 07/11/2020 Duration: 24 mins Patient history: 86 year old female who presented with left-sided weakness and left hemianopia.  EEG evaluate for seizures. Level of alertness: Awake AEDs during EEG study: None Technical aspects: This EEG study was done with scalp electrodes positioned according to the 10-20 International system of electrode placement. Electrical activity was acquired at a sampling rate of 500Hz  and reviewed with a high frequency filter of 70Hz  and a low frequency filter of 1Hz . EEG data were recorded continuously and digitally stored. Description: No posterior dominant rhythm was seen. EEG showed 8-9 z alpha activity in left hemisphere and continuous 3-5hz  theta-delta activity in right hemisphere which at times appear rhythmic and sharply contoured without definite evolution. Sharp waves were also seen in right frontal region.  Hyperventilation and photic stimulation were not performed.   ABNORMALITY - Sharp wave, right frontal region -Continuous slow, right hemisphere  IMPRESSION: This study showed evidence of epileptogenicity arising from right frontal region as well as cortical dysfunction in right hemisphere likely secondary to underlying structural abnormality, post-ictal state. Lora Havens   CT Code Stroke CTA Head W/WO contrast  Result Date: 07/11/2020 CLINICAL DATA:  Headache. Blurred vision. Left upper extremity weakness. EXAM: CT ANGIOGRAPHY HEAD  AND NECK CT PERFUSION BRAIN TECHNIQUE: Multidetector CT imaging of the head and neck was performed using the standard protocol during bolus administration of intravenous contrast. Multiplanar CT image reconstructions and MIPs were obtained to evaluate the vascular anatomy. Carotid stenosis measurements (when applicable) are obtained utilizing NASCET criteria, using the distal internal carotid diameter as the denominator. Multiphase CT imaging of the brain was performed following IV bolus contrast injection. Subsequent parametric perfusion maps were calculated using RAPID software. CONTRAST:  128mL OMNIPAQUE IOHEXOL 350 MG/ML SOLN COMPARISON:  Head CT earlier same day. FINDINGS: CTA NECK FINDINGS Aortic arch: Aortic atherosclerosis and tortuosity. No aneurysm or dissection. Branching pattern is normal without origin stenosis. Right carotid system: Common carotid artery widely patent to the bifurcation. Carotid bifurcation is widely patent without soft or calcified plaque. Cervical ICA widely patent. Left carotid system: Common carotid artery widely patent to the bifurcation. Minimal calcified plaque at the carotid bifurcation but no stenosis. Cervical ICA widely patent. Vertebral arteries: Both vertebral artery origins are widely patent. Both vertebral arteries appeared widely patent and normal through the cervical region to the foramen magnum. Skeleton: Distant cervical fusion. Chronic degenerative anterolisthesis at C7-T1. Old minor superior endplate deformity at T6. Other neck: No mass or lymphadenopathy. Upper chest:   Scarring at the lung apices. No active process. Review of the MIP images confirms the above findings CTA HEAD FINDINGS Anterior circulation: Both internal carotid arteries are widely patent through the skull base and siphon regions. No siphon stenosis. The anterior and middle cerebral vessels are normal without proximal stenosis, aneurysm or vascular malformation. No large or medium vessel occlusion. Posterior circulation: Both vertebral arteries are widely patent to the basilar. No basilar stenosis. Posterior circulation branch vessels appear normal. Venous sinuses: Patent and normal. Anatomic variants: None Review of the MIP images confirms the above findings CT Brain Perfusion Findings: ASPECTS: 10 CBF (<30%) Volume: 47mL Perfusion (Tmax>6.0s) volume: 72mL Mismatch Volume: 11mL Infarction Location:None IMPRESSION: 1. No large or medium vessel occlusion. Normal perfusion study. 2. Aortic atherosclerosis. 3. Minimal calcified plaque at the left carotid bifurcation but no stenosis. Aortic Atherosclerosis (ICD10-I70.0). Electronically Signed   By: Nelson Chimes M.D.   On: 07/11/2020 12:30   CT HEAD WO CONTRAST  Result Date: 07/11/2020 CLINICAL DATA:  TIA this morning, headache, blurry vision, left upper extremity weakness now resolved. No reported injury. EXAM: CT HEAD WITHOUT CONTRAST TECHNIQUE: Contiguous axial images were obtained from the base of the skull through the vertex without intravenous contrast. COMPARISON:  06/19/2020 head CT FINDINGS: Brain: No evidence of parenchymal hemorrhage or extra-axial fluid collection. No mass lesion, mass effect, or midline shift. No CT evidence of acute infarction. Generalized cerebral volume loss. Nonspecific moderate subcortical and periventricular white matter hypodensity, most in keeping with chronic small vessel ischemic change. No ventriculomegaly. Vascular: No acute abnormality. Skull: No evidence of calvarial fracture. Sinuses/Orbits: The visualized paranasal sinuses  are essentially clear. Other:  The mastoid air cells are unopacified. IMPRESSION: 1. No evidence of acute intracranial abnormality. 2. Generalized cerebral volume loss and moderate chronic small vessel ischemic changes in the cerebral white matter. Electronically Signed   By: Ilona Sorrel M.D.   On: 07/11/2020 09:50   CT Code Stroke CTA Neck W/WO contrast  Result Date: 07/11/2020 CLINICAL DATA:  Headache. Blurred vision. Left upper extremity weakness. EXAM: CT ANGIOGRAPHY HEAD AND NECK CT PERFUSION BRAIN TECHNIQUE: Multidetector CT imaging of the head and neck was performed using the standard protocol during bolus administration of intravenous contrast. Multiplanar  CT image reconstructions and MIPs were obtained to evaluate the vascular anatomy. Carotid stenosis measurements (when applicable) are obtained utilizing NASCET criteria, using the distal internal carotid diameter as the denominator. Multiphase CT imaging of the brain was performed following IV bolus contrast injection. Subsequent parametric perfusion maps were calculated using RAPID software. CONTRAST:  161mL OMNIPAQUE IOHEXOL 350 MG/ML SOLN COMPARISON:  Head CT earlier same day. FINDINGS: CTA NECK FINDINGS Aortic arch: Aortic atherosclerosis and tortuosity. No aneurysm or dissection. Branching pattern is normal without origin stenosis. Right carotid system: Common carotid artery widely patent to the bifurcation. Carotid bifurcation is widely patent without soft or calcified plaque. Cervical ICA widely patent. Left carotid system: Common carotid artery widely patent to the bifurcation. Minimal calcified plaque at the carotid bifurcation but no stenosis. Cervical ICA widely patent. Vertebral arteries: Both vertebral artery origins are widely patent. Both vertebral arteries appeared widely patent and normal through the cervical region to the foramen magnum. Skeleton: Distant cervical fusion. Chronic degenerative anterolisthesis at C7-T1. Old minor  superior endplate deformity at T6. Other neck: No mass or lymphadenopathy. Upper chest:  Scarring at the lung apices. No active process. Review of the MIP images confirms the above findings CTA HEAD FINDINGS Anterior circulation: Both internal carotid arteries are widely patent through the skull base and siphon regions. No siphon stenosis. The anterior and middle cerebral vessels are normal without proximal stenosis, aneurysm or vascular malformation. No large or medium vessel occlusion. Posterior circulation: Both vertebral arteries are widely patent to the basilar. No basilar stenosis. Posterior circulation branch vessels appear normal. Venous sinuses: Patent and normal. Anatomic variants: None Review of the MIP images confirms the above findings CT Brain Perfusion Findings: ASPECTS: 10 CBF (<30%) Volume: 66mL Perfusion (Tmax>6.0s) volume: 4mL Mismatch Volume: 89mL Infarction Location:None IMPRESSION: 1. No large or medium vessel occlusion. Normal perfusion study. 2. Aortic atherosclerosis. 3. Minimal calcified plaque at the left carotid bifurcation but no stenosis. Aortic Atherosclerosis (ICD10-I70.0). Electronically Signed   By: Nelson Chimes M.D.   On: 07/11/2020 12:30   MR BRAIN W WO CONTRAST  Result Date: 07/11/2020 CLINICAL DATA:  Left-sided weakness neglect EXAM: MRI HEAD WITHOUT AND WITH CONTRAST TECHNIQUE: Multiplanar, multiecho pulse sequences of the brain and surrounding structures were obtained without and with intravenous contrast. CONTRAST:  53mL GADAVIST GADOBUTROL 1 MMOL/ML IV SOLN COMPARISON:  06/19/2020 FINDINGS: Motion artifact is present. Brain: There is no acute infarction or intracranial hemorrhage. There is no intracranial mass, mass effect, or edema. There is no hydrocephalus or extra-axial fluid collection. Prominence of the ventricles and sulci reflects generalized parenchymal volume loss similar to the prior study. Patchy T2 hyperintensity in the supratentorial white matter is  nonspecific but probably reflects stable chronic microvascular ischemic changes. There is no abnormal enhancement. Vascular: Major vessel flow voids at the skull base are preserved. Skull and upper cervical spine: Normal marrow signal is preserved. Sinuses/Orbits: Paranasal sinuses are aerated. Bilateral lens replacements. Other: Sella is unremarkable.  Mastoid air cells are clear. IMPRESSION: Motion degraded. No acute intracranial abnormality. Stable chronic findings detailed above. Electronically Signed   By: Macy Mis M.D.   On: 07/11/2020 18:45   CT Code Stroke Cerebral Perfusion with contrast  Result Date: 07/11/2020 CLINICAL DATA:  Headache. Blurred vision. Left upper extremity weakness. EXAM: CT ANGIOGRAPHY HEAD AND NECK CT PERFUSION BRAIN TECHNIQUE: Multidetector CT imaging of the head and neck was performed using the standard protocol during bolus administration of intravenous contrast. Multiplanar CT image reconstructions and MIPs were obtained  to evaluate the vascular anatomy. Carotid stenosis measurements (when applicable) are obtained utilizing NASCET criteria, using the distal internal carotid diameter as the denominator. Multiphase CT imaging of the brain was performed following IV bolus contrast injection. Subsequent parametric perfusion maps were calculated using RAPID software. CONTRAST:  165mL OMNIPAQUE IOHEXOL 350 MG/ML SOLN COMPARISON:  Head CT earlier same day. FINDINGS: CTA NECK FINDINGS Aortic arch: Aortic atherosclerosis and tortuosity. No aneurysm or dissection. Branching pattern is normal without origin stenosis. Right carotid system: Common carotid artery widely patent to the bifurcation. Carotid bifurcation is widely patent without soft or calcified plaque. Cervical ICA widely patent. Left carotid system: Common carotid artery widely patent to the bifurcation. Minimal calcified plaque at the carotid bifurcation but no stenosis. Cervical ICA widely patent. Vertebral arteries:  Both vertebral artery origins are widely patent. Both vertebral arteries appeared widely patent and normal through the cervical region to the foramen magnum. Skeleton: Distant cervical fusion. Chronic degenerative anterolisthesis at C7-T1. Old minor superior endplate deformity at T6. Other neck: No mass or lymphadenopathy. Upper chest:  Scarring at the lung apices. No active process. Review of the MIP images confirms the above findings CTA HEAD FINDINGS Anterior circulation: Both internal carotid arteries are widely patent through the skull base and siphon regions. No siphon stenosis. The anterior and middle cerebral vessels are normal without proximal stenosis, aneurysm or vascular malformation. No large or medium vessel occlusion. Posterior circulation: Both vertebral arteries are widely patent to the basilar. No basilar stenosis. Posterior circulation branch vessels appear normal. Venous sinuses: Patent and normal. Anatomic variants: None Review of the MIP images confirms the above findings CT Brain Perfusion Findings: ASPECTS: 10 CBF (<30%) Volume: 73mL Perfusion (Tmax>6.0s) volume: 39mL Mismatch Volume: 34mL Infarction Location:None IMPRESSION: 1. No large or medium vessel occlusion. Normal perfusion study. 2. Aortic atherosclerosis. 3. Minimal calcified plaque at the left carotid bifurcation but no stenosis. Aortic Atherosclerosis (ICD10-I70.0). Electronically Signed   By: Nelson Chimes M.D.   On: 07/11/2020 12:30   DG CHEST PORT 1 VIEW  Result Date: 07/18/2020 CLINICAL DATA:  Cough EXAM: PORTABLE CHEST 1 VIEW COMPARISON:  03/02/2020 chest radiograph and prior. FINDINGS: Cardiomediastinal silhouette within normal limits. Aortic atherosclerotic calcifications. Stable appearance of right mid lung calcified granuloma. No focal consolidation. No pneumothorax or pleural effusion. Multilevel spondylosis. Partially imaged ACDF and lumbar fixation hardware. IMPRESSION: No acute airspace disease. Electronically Signed    By: Primitivo Gauze M.D.   On: 07/18/2020 09:13    Assessment/Plan Seizures (HCC) Stable on Kepprra  C/O Hallucinations  d/w Neurology  They said no Change in Kepprra right now Will Order some Labs Continue Neuro Checks Talked to Patient she does not want to go to ED right now If anything Changes will Send to ED Neurology Follow up arranged  TIA (transient ischemic attack) Plavix got 3 weeks Will discontinue Continue on Aspirin  Cough Mucinex Started Chest Xray was negative done today  HTN (hypertension), benign New Diagnosis Norvasc Monitor BP here  Hypothyroidism due to medication TSH normal in 7/21  Age-related osteoporosis with current pathological fracture, sequela Continue [place]  Hyponatremia Follow BMP  Left hip pain s/p IM placement Has follow up with Dr Noralee Chars  Anxiety Will continue ativan PRN.  Will make Melatonin PRN     Labs/tests ordered:  BMP and CBC in 1 week  Total time spent in this patient care encounter was  60_  minutes; greater than 50% of the visit spent counseling patient and staff, reviewing records ,  Labs and coordinating care for problems addressed at this encounter.

## 2020-07-23 ENCOUNTER — Encounter: Payer: Self-pay | Admitting: Internal Medicine

## 2020-07-23 ENCOUNTER — Non-Acute Institutional Stay: Payer: Medicare Other | Admitting: Internal Medicine

## 2020-07-23 DIAGNOSIS — E032 Hypothyroidism due to medicaments and other exogenous substances: Secondary | ICD-10-CM | POA: Diagnosis not present

## 2020-07-23 DIAGNOSIS — R05 Cough: Secondary | ICD-10-CM

## 2020-07-23 DIAGNOSIS — G459 Transient cerebral ischemic attack, unspecified: Secondary | ICD-10-CM | POA: Diagnosis not present

## 2020-07-23 DIAGNOSIS — R569 Unspecified convulsions: Secondary | ICD-10-CM | POA: Diagnosis not present

## 2020-07-23 DIAGNOSIS — I10 Essential (primary) hypertension: Secondary | ICD-10-CM | POA: Diagnosis not present

## 2020-07-23 DIAGNOSIS — R059 Cough, unspecified: Secondary | ICD-10-CM

## 2020-07-23 NOTE — Progress Notes (Signed)
Location:   [facility] Room Number: [place] of Service:  [facility] [phone]) Provider:  Veleta Miners MD  [name], MD  Patient Care Team: [name], MD as PCP - General (Internal Medicine)  Extended Emergency Contact Information Primary Emergency Contact: Katherine Mantle of [place] Phone: [phone] Relation: Daughter Secondary Emergency Contact: Milon Dikes [place] of [place] Phone: [phone] Mobile Phone: [phone] Relation: Friend  Code Status:  DNR Goals of care: Advanced Directive information Advanced Directives 07/11/2020  Does Patient Have a Medical Advance Directive? Yes  Type of Advance Directive Out of facility DNR (pink MOST or yellow form)  Does patient want to make changes to medical advance directive? No - Guardian declined  Copy of Healthcare Power of Attorney in Chart? -  Would patient like information on creating a medical advance directive? -  Pre-existing out of facility DNR order (yellow form or pink MOST form) -     Chief Complaint  Patient presents with   Acute Visit    HPI:  Pt is a 86 y.o. female seen today for an acute visit for Follow up  Of her Hallucinations  Patient was initially admitted from 7/21-7/22 for questionable TIA with ?Left sided weakness She was discharged on dual therapy for 3 weeks and then on aspirin. She was again admitted from 8/12 -8/19 new onset seizures  This admission present patient states that she was playing cards and realized that she could not see the numbers.  Later that day when she told the nurse the nurse noticed the left-sided weakness again and tender to the hospital. She had extensive work-up with CT and MRI which did not show any acute CVA.  But the EEG showed a focal seizure.  In the right frontal region. Was seen by neurology who started her on Keppra.  It was recommended that patient should have a LP done but at this time she refused and  wanted to wait.  Patient is now in [place] Here she was c/o Visual Hallucinations. Refused to go to ED. Then she refused to take [place]. So I talked to her to reduce the dose to 500 mg BID She is now agreed to take that. Says her Hallucinations are gone now Walking with her walker. No other Neurological Symptoms  Past Medical History:  Diagnosis Date   Abdominal pain 02/26/2017   Anxiety    Arthritis    Left Knee, Wrist, Back and Hips   Cervical vertebral fusion    Diverticulitis    Diverticulosis    Fibromyalgia    GERD (gastroesophageal reflux disease)    Gout 02/2018   L hand   Hypothyroidism    Pelvis fracture ([place])    Peritoneal free air 03/29/2012   Pneumonia    Pneumoperitoneum 02/26/2017   Thyroid disorder    TIA (transient ischemic attack)    Toe pain 09/06/2016   Past Surgical History:  Procedure Laterality Date   ABDOMINAL HYSTERECTOMY     BOWEL RESECTION N/A 07/09/2017   Procedure: SMALL BOWEL RESECTION;  Surgeon: Johnathan Hausen, MD;  Location: [facility];  Service: General;  Laterality: N/A;   CERVICAL FUSION     x2   FEMUR IM NAIL Left 03/03/2020   Procedure: INTRAMEDULLARY (IM) NAIL FEMORAL;  Surgeon: Paralee Cancel, MD;  Location: [facility];  Service: Orthopedics;  Laterality: Left;   LAPAROSCOPIC APPENDECTOMY N/A 03/04/2017   Procedure: APPENDECTOMY LAPAROSCOPIC;  Surgeon: Johnathan Hausen, MD;  Location: [facility];  Service: General;  Laterality: N/A;   LAPAROSCOPY N/A 03/04/2017   Procedure: LAPAROSCOPY DIAGNOSTIC, ENTEROLYSIS;  Surgeon: Johnathan Hausen, MD;  Location: [facility];  Service: General;  Laterality: N/A;   LAPAROTOMY N/A 07/09/2017   Procedure: EXPLORATORY LAPAROTOMY;  Surgeon: Johnathan Hausen, MD;  Location: [facility];  Service: General;  Laterality: N/A;   SPINE SURGERY     Lumbar- rod placement   TONSILLECTOMY     86y/o   TOTAL VAGINAL HYSTERECTOMY      Allergies  Allergen Reactions   Codeine Rash   Penicillins Rash    Has patient  had a PCN reaction causing immediate rash, facial/tongue/throat swelling, SOB or lightheadedness with hypotension: No Has patient had a PCN reaction causing severe rash involving mucus membranes or skin necrosis: No Has patient had a PCN reaction that required hospitalization: No Has patient had a PCN reaction occurring within the last 10 years: No If all of the above answers are "NO", then may proceed with Cephalosporin use.    Levaquin [Levofloxacin]     Allergies as of 07/23/2020      Reactions   Codeine Rash   Penicillins Rash   Has patient had a PCN reaction causing immediate rash, facial/tongue/throat swelling, SOB or lightheadedness with hypotension: No Has patient had a PCN reaction causing severe rash involving mucus membranes or skin necrosis: No Has patient had a PCN reaction that required hospitalization: No Has patient had a PCN reaction occurring within the last 10 years: No If all of the above answers are "NO", then may proceed with Cephalosporin use.   Levaquin [levofloxacin]       Medication List       Accurate as of July 23, 2020  2:01 PM. If you have any questions, ask your nurse or doctor.        STOP taking these medications   clopidogrel 75 MG tablet Commonly known as: PLAVIX Stopped by: [name], MD     TAKE these medications   amLODipine 2.5 MG tablet Commonly known as: NORVASC Take 1 tablet (2.5 mg total) by mouth daily.   aspirin 81 MG EC tablet Take 1 tablet (81 mg total) by mouth daily. Swallow whole.   atorvastatin 20 MG tablet Commonly known as: LIPITOR Take 1 tablet (20 mg total) by mouth daily.   CALCIUM 500 PO Take 1 tablet by mouth daily.   DentaGel 1.1 % Gel dental gel Generic drug: sodium fluoride Place 1 application onto teeth daily.   diclofenac Sodium 1 % Gel Commonly known as: VOLTAREN Apply topically in the morning and at bedtime. To knee   docusate sodium 100 MG capsule Commonly known as: COLACE Take 100 mg  by mouth daily as needed for mild constipation.   guaiFENesin 600 MG 12 hr tablet Commonly known as: MUCINEX Take 600 mg by mouth every 12 (twelve) hours as needed.   ibandronate 150 MG tablet Commonly known as: BONIVA Take 150 mg by mouth every 30 (thirty) days. Take in the morning with a full glass of water, on an empty stomach, and do not take anything else by mouth or lie down for the next 30 min.   ibuprofen 200 MG tablet Commonly known as: ADVIL Take 200 mg by mouth daily as needed for headache.   levETIRAcetam 500 MG tablet Commonly known as: KEPPRA Take 500 mg by mouth 2 (two) times daily. What changed: Another medication with the same name was removed. Continue taking this medication, and follow the directions you  see here. Changed by: [name], MD   levothyroxine 75 MCG tablet Commonly known as: SYNTHROID Take 1 tablet (75 mcg total) by mouth daily before breakfast.   LORazepam 0.5 MG tablet Commonly known as: Ativan Take 0.5 tablets (0.25 mg total) by mouth 2 (two) times daily as needed for anxiety.   melatonin 3 MG Tabs tablet Take 1 tablet (3 mg total) by mouth at bedtime for 15 days.   mupirocin ointment 2 % Commonly known as: BACTROBAN Apply 1 application topically daily as needed (nose sores).   omeprazole 20 MG capsule Commonly known as: PRILOSEC Take 20 mg by mouth daily as needed (indigestion.).   polyethylene glycol 17 g packet Commonly known as: MIRALAX / GLYCOLAX Take 17 g by mouth daily.   Prevagen 10 MG Caps Generic drug: Apoaequorin Take 10 mg by mouth daily.   saccharomyces boulardii 250 MG capsule Commonly known as: FLORASTOR Take 250 mg by mouth daily.   senna-docusate 8.6-50 MG tablet Commonly known as: Senokot-S Take 2 tablets by mouth 2 (two) times daily.   Theratears PF 0.25 % Soln Generic drug: Carboxymethylcellulose Sod PF Apply 1 drop to eye in the morning and at bedtime.   traMADol 50 MG tablet Commonly known as:  ULTRAM Take 50 mg by mouth every 6 (six) hours as needed for moderate pain.   TYLENOL EXTRA STRENGTH PO Take 500 mg by mouth in the morning and at bedtime.   Vitamin D (Cholecalciferol) 50 MCG (2000 UT) Caps Take 1 tablet by mouth daily.   zolpidem 5 MG tablet Commonly known as: AMBIEN Take 0.5 tablets (2.5 mg total) by mouth at bedtime as needed for sleep. What changed: when to take this       Review of Systems  Review of Systems  Constitutional: Negative for activity change, appetite change, chills, diaphoresis, fatigue and fever.  HENT: Negative for mouth sores, postnasal drip, rhinorrhea, sinus pain and sore throat.   Respiratory: Negative for apnea, , chest tightness, shortness of breath and wheezing.   Cardiovascular: Negative for chest pain, palpitations and leg swelling.  Gastrointestinal: Negative for abdominal distention, abdominal pain, constipation, diarrhea, nausea and vomiting.  Genitourinary: Negative for dysuria and frequency.  Musculoskeletal: Negative for arthralgias, joint swelling and myalgias.  Skin: Negative for rash.  Neurological: Negative for dizziness, syncope, weakness, light-headedness and numbness.  Psychiatric/Behavioral: Negative for behavioral problems, confusion and sleep disturbance.     Immunization History  Administered Date(s) Administered   Influenza-Unspecified 09/01/2018   Moderna SARS-COVID-2 Vaccination 12/04/2019, 01/01/2020   Zoster Recombinat (Shingrix) 07/05/2018   Pertinent  Health Maintenance Due  Topic Date Due   DEXA SCAN  Never done   PNA vac Low Risk Adult (1 of 2 - PCV13) Never done   INFLUENZA VACCINE  06/30/2020   Fall Risk  06/14/2020 04/26/2020  Falls in the past year? 0 1  Number falls in past yr: 0 0  Injury with Fall? - 1   Functional Status Survey:    Vitals:   07/23/20 1341  BP: 118/62  Pulse: 81  Resp: 18  Temp: (!) 97.5 F (36.4 C)  SpO2: 93%  Weight: 93 lb 6.4 oz (42.4 kg)  Height: 5'  (1.524 m)   Body mass index is 18.24 kg/m. Physical Exam Constitutional: Oriented to person, place, and time. Well-developed and well-nourished.  HENT:  Head: Normocephalic.  Mouth/Throat: Oropharynx is clear and moist.  Eyes: Pupils are equal, round, and reactive to light.  Neck: Neck supple.  Cardiovascular: Normal rate and normal heart sounds.  No murmur heard. Pulmonary/Chest: Effort normal and breath sounds normal. No respiratory distress. No wheezes. She has no rales.  Abdominal: Soft. Bowel sounds are normal. No distension. There is no tenderness. There is no rebound.  Musculoskeletal: No edema.  Lymphadenopathy: none Neurological: Alert and oriented to person, place, and time.  Skin: Skin is warm and dry.  Psychiatric: Normal mood and affect. Behavior is normal. Thought content normal.  Labs reviewed: Recent Labs    07/12/20 0729 07/13/20 0255 07/14/20 0307  NA 131* 133* 134*  K 3.6 3.7 4.0  CL 97* 101 102  CO2 24 24 23   GLUCOSE 99 108* 108*  BUN 11 16 12   CREATININE 0.67 0.72 0.65  CALCIUM 9.4 8.7* 9.1   Recent Labs    04/17/20 0000 04/17/20 0000 06/13/20 0700 06/19/20 1248 07/11/20 0914  AST 18   < > 19 24 24   ALT 10   < > 13 14 19   ALKPHOS 72  --   --  55 62  BILITOT  --   --  0.6 0.6 0.8  PROT  --   --  6.6 6.9 6.8  ALBUMIN 3.7  --   --  3.7 3.9   < > = values in this interval not displayed.   Recent Labs    06/19/20 1248 06/19/20 1248 06/19/20 2151 06/20/20 0421 07/11/20 0914  WBC 7.1   < > 6.0 6.3 7.1  NEUTROABS 4.6  --   --  3.6 5.5  HGB 12.7   < > 12.6 12.0 13.2  HCT 39.4   < > 38.9 36.6 40.3  MCV 98.5   < > 97.5 95.8 96.0  PLT 471*   < > 418* 397 521*   < > = values in this interval not displayed.   Lab Results  Component Value Date   TSH 2.389 06/19/2020   Lab Results  Component Value Date   HGBA1C 6.1 (H) 06/20/2020   Lab Results  Component Value Date   CHOL 171 06/20/2020   HDL 59 06/20/2020   LDLCALC 94 06/20/2020    TRIG 89 06/20/2020   CHOLHDL 2.9 06/20/2020    Significant Diagnostic Results in last 30 days:  EEG  Result Date: 07/11/2020 Lora Havens, MD     07/11/2020  4:21 PM Patient Name: BRIGITTE SODERBERG MRN: [id number] Epilepsy Attending: Lora Havens Referring Physician/Provider: Dr. Varney Baas Date: 07/11/2020 Duration: 24 mins Patient history: 86 year old female who presented with left-sided weakness and left hemianopia.  EEG evaluate for seizures. Level of alertness: Awake AEDs during EEG study: None Technical aspects: This EEG study was done with scalp electrodes positioned according to the 10-20 International system of electrode placement. Electrical activity was acquired at a sampling rate of 500Hz  and reviewed with a high frequency filter of 70Hz  and a low frequency filter of 1Hz . EEG data were recorded continuously and digitally stored. Description: No posterior dominant rhythm was seen. EEG showed 8-9 z alpha activity in left hemisphere and continuous 3-5hz  theta-delta activity in right hemisphere which at times appear rhythmic and sharply contoured without definite evolution. Sharp waves were also seen in right frontal region.  Hyperventilation and photic stimulation were not performed.   ABNORMALITY - Sharp wave, right frontal region -Continuous slow, right hemisphere IMPRESSION: This study showed evidence of epileptogenicity arising from right frontal region as well as cortical dysfunction in right hemisphere likely secondary to underlying structural abnormality, post-ictal state. Priyanka O  Hortense Ramal   CT Code Stroke CTA Head W/WO contrast  Result Date: 07/11/2020 CLINICAL DATA:  Headache. Blurred vision. Left upper extremity weakness. EXAM: CT ANGIOGRAPHY HEAD AND NECK CT PERFUSION BRAIN TECHNIQUE: Multidetector CT imaging of the head and neck was performed using the standard protocol during bolus administration of intravenous contrast. Multiplanar CT image reconstructions and MIPs were  obtained to evaluate the vascular anatomy. Carotid stenosis measurements (when applicable) are obtained utilizing NASCET criteria, using the distal internal carotid diameter as the denominator. Multiphase CT imaging of the brain was performed following IV bolus contrast injection. Subsequent parametric perfusion maps were calculated using RAPID software. CONTRAST:  12mL OMNIPAQUE IOHEXOL 350 MG/ML SOLN COMPARISON:  Head CT earlier same day. FINDINGS: CTA NECK FINDINGS Aortic arch: Aortic atherosclerosis and tortuosity. No aneurysm or dissection. Branching pattern is normal without origin stenosis. Right carotid system: Common carotid artery widely patent to the bifurcation. Carotid bifurcation is widely patent without soft or calcified plaque. Cervical ICA widely patent. Left carotid system: Common carotid artery widely patent to the bifurcation. Minimal calcified plaque at the carotid bifurcation but no stenosis. Cervical ICA widely patent. Vertebral arteries: Both vertebral artery origins are widely patent. Both vertebral arteries appeared widely patent and normal through the cervical region to the foramen magnum. Skeleton: Distant cervical fusion. Chronic degenerative anterolisthesis at C7-T1. Old minor superior endplate deformity at T6. Other neck: No mass or lymphadenopathy. Upper chest:  Scarring at the lung apices. No active process. Review of the MIP images confirms the above findings CTA HEAD FINDINGS Anterior circulation: Both internal carotid arteries are widely patent through the skull base and siphon regions. No siphon stenosis. The anterior and middle cerebral vessels are normal without proximal stenosis, aneurysm or vascular malformation. No large or medium vessel occlusion. Posterior circulation: Both vertebral arteries are widely patent to the basilar. No basilar stenosis. Posterior circulation branch vessels appear normal. Venous sinuses: Patent and normal. Anatomic variants: None Review of the  MIP images confirms the above findings CT Brain Perfusion Findings: ASPECTS: 10 CBF (<30%) Volume: 15mL Perfusion (Tmax>6.0s) volume: 70mL Mismatch Volume: 4mL Infarction Location:None IMPRESSION: 1. No large or medium vessel occlusion. Normal perfusion study. 2. Aortic atherosclerosis. 3. Minimal calcified plaque at the left carotid bifurcation but no stenosis. Aortic Atherosclerosis (ICD10-I70.0). Electronically Signed   By: Nelson Chimes M.D.   On: 07/11/2020 12:30   CT HEAD WO CONTRAST  Result Date: 07/11/2020 CLINICAL DATA:  TIA this morning, headache, blurry vision, left upper extremity weakness now resolved. No reported injury. EXAM: CT HEAD WITHOUT CONTRAST TECHNIQUE: Contiguous axial images were obtained from the base of the skull through the vertex without intravenous contrast. COMPARISON:  06/19/2020 head CT FINDINGS: Brain: No evidence of parenchymal hemorrhage or extra-axial fluid collection. No mass lesion, mass effect, or midline shift. No CT evidence of acute infarction. Generalized cerebral volume loss. Nonspecific moderate subcortical and periventricular white matter hypodensity, most in keeping with chronic small vessel ischemic change. No ventriculomegaly. Vascular: No acute abnormality. Skull: No evidence of calvarial fracture. Sinuses/Orbits: The visualized paranasal sinuses are essentially clear. Other:  The mastoid air cells are unopacified. IMPRESSION: 1. No evidence of acute intracranial abnormality. 2. Generalized cerebral volume loss and moderate chronic small vessel ischemic changes in the cerebral white matter. Electronically Signed   By: Ilona Sorrel M.D.   On: 07/11/2020 09:50   CT Code Stroke CTA Neck W/WO contrast  Result Date: 07/11/2020 CLINICAL DATA:  Headache. Blurred vision. Left upper extremity weakness. EXAM: CT ANGIOGRAPHY  HEAD AND NECK CT PERFUSION BRAIN TECHNIQUE: Multidetector CT imaging of the head and neck was performed using the standard protocol during bolus  administration of intravenous contrast. Multiplanar CT image reconstructions and MIPs were obtained to evaluate the vascular anatomy. Carotid stenosis measurements (when applicable) are obtained utilizing NASCET criteria, using the distal internal carotid diameter as the denominator. Multiphase CT imaging of the brain was performed following IV bolus contrast injection. Subsequent parametric perfusion maps were calculated using RAPID software. CONTRAST:  192mL OMNIPAQUE IOHEXOL 350 MG/ML SOLN COMPARISON:  Head CT earlier same day. FINDINGS: CTA NECK FINDINGS Aortic arch: Aortic atherosclerosis and tortuosity. No aneurysm or dissection. Branching pattern is normal without origin stenosis. Right carotid system: Common carotid artery widely patent to the bifurcation. Carotid bifurcation is widely patent without soft or calcified plaque. Cervical ICA widely patent. Left carotid system: Common carotid artery widely patent to the bifurcation. Minimal calcified plaque at the carotid bifurcation but no stenosis. Cervical ICA widely patent. Vertebral arteries: Both vertebral artery origins are widely patent. Both vertebral arteries appeared widely patent and normal through the cervical region to the foramen magnum. Skeleton: Distant cervical fusion. Chronic degenerative anterolisthesis at C7-T1. Old minor superior endplate deformity at T6. Other neck: No mass or lymphadenopathy. Upper chest:  Scarring at the lung apices. No active process. Review of the MIP images confirms the above findings CTA HEAD FINDINGS Anterior circulation: Both internal carotid arteries are widely patent through the skull base and siphon regions. No siphon stenosis. The anterior and middle cerebral vessels are normal without proximal stenosis, aneurysm or vascular malformation. No large or medium vessel occlusion. Posterior circulation: Both vertebral arteries are widely patent to the basilar. No basilar stenosis. Posterior circulation branch vessels  appear normal. Venous sinuses: Patent and normal. Anatomic variants: None Review of the MIP images confirms the above findings CT Brain Perfusion Findings: ASPECTS: 10 CBF (<30%) Volume: 59mL Perfusion (Tmax>6.0s) volume: 40mL Mismatch Volume: 36mL Infarction Location:None IMPRESSION: 1. No large or medium vessel occlusion. Normal perfusion study. 2. Aortic atherosclerosis. 3. Minimal calcified plaque at the left carotid bifurcation but no stenosis. Aortic Atherosclerosis (ICD10-I70.0). Electronically Signed   By: Nelson Chimes M.D.   On: 07/11/2020 12:30   MR BRAIN W WO CONTRAST  Result Date: 07/11/2020 CLINICAL DATA:  Left-sided weakness neglect EXAM: MRI HEAD WITHOUT AND WITH CONTRAST TECHNIQUE: Multiplanar, multiecho pulse sequences of the brain and surrounding structures were obtained without and with intravenous contrast. CONTRAST:  22mL GADAVIST GADOBUTROL 1 MMOL/ML IV SOLN COMPARISON:  06/19/2020 FINDINGS: Motion artifact is present. Brain: There is no acute infarction or intracranial hemorrhage. There is no intracranial mass, mass effect, or edema. There is no hydrocephalus or extra-axial fluid collection. Prominence of the ventricles and sulci reflects generalized parenchymal volume loss similar to the prior study. Patchy T2 hyperintensity in the supratentorial white matter is nonspecific but probably reflects stable chronic microvascular ischemic changes. There is no abnormal enhancement. Vascular: Major vessel flow voids at the skull base are preserved. Skull and upper cervical spine: Normal marrow signal is preserved. Sinuses/Orbits: Paranasal sinuses are aerated. Bilateral lens replacements. Other: Sella is unremarkable.  Mastoid air cells are clear. IMPRESSION: Motion degraded. No acute intracranial abnormality. Stable chronic findings detailed above. Electronically Signed   By: Macy Mis M.D.   On: 07/11/2020 18:45   CT Code Stroke Cerebral Perfusion with contrast  Result Date:  07/11/2020 CLINICAL DATA:  Headache. Blurred vision. Left upper extremity weakness. EXAM: CT ANGIOGRAPHY HEAD AND NECK CT PERFUSION BRAIN  TECHNIQUE: Multidetector CT imaging of the head and neck was performed using the standard protocol during bolus administration of intravenous contrast. Multiplanar CT image reconstructions and MIPs were obtained to evaluate the vascular anatomy. Carotid stenosis measurements (when applicable) are obtained utilizing NASCET criteria, using the distal internal carotid diameter as the denominator. Multiphase CT imaging of the brain was performed following IV bolus contrast injection. Subsequent parametric perfusion maps were calculated using RAPID software. CONTRAST:  142mL OMNIPAQUE IOHEXOL 350 MG/ML SOLN COMPARISON:  Head CT earlier same day. FINDINGS: CTA NECK FINDINGS Aortic arch: Aortic atherosclerosis and tortuosity. No aneurysm or dissection. Branching pattern is normal without origin stenosis. Right carotid system: Common carotid artery widely patent to the bifurcation. Carotid bifurcation is widely patent without soft or calcified plaque. Cervical ICA widely patent. Left carotid system: Common carotid artery widely patent to the bifurcation. Minimal calcified plaque at the carotid bifurcation but no stenosis. Cervical ICA widely patent. Vertebral arteries: Both vertebral artery origins are widely patent. Both vertebral arteries appeared widely patent and normal through the cervical region to the foramen magnum. Skeleton: Distant cervical fusion. Chronic degenerative anterolisthesis at C7-T1. Old minor superior endplate deformity at T6. Other neck: No mass or lymphadenopathy. Upper chest:  Scarring at the lung apices. No active process. Review of the MIP images confirms the above findings CTA HEAD FINDINGS Anterior circulation: Both internal carotid arteries are widely patent through the skull base and siphon regions. No siphon stenosis. The anterior and middle cerebral  vessels are normal without proximal stenosis, aneurysm or vascular malformation. No large or medium vessel occlusion. Posterior circulation: Both vertebral arteries are widely patent to the basilar. No basilar stenosis. Posterior circulation branch vessels appear normal. Venous sinuses: Patent and normal. Anatomic variants: None Review of the MIP images confirms the above findings CT Brain Perfusion Findings: ASPECTS: 10 CBF (<30%) Volume: 52mL Perfusion (Tmax>6.0s) volume: 19mL Mismatch Volume: 18mL Infarction Location:None IMPRESSION: 1. No large or medium vessel occlusion. Normal perfusion study. 2. Aortic atherosclerosis. 3. Minimal calcified plaque at the left carotid bifurcation but no stenosis. Aortic Atherosclerosis (ICD10-I70.0). Electronically Signed   By: Nelson Chimes M.D.   On: 07/11/2020 12:30   DG CHEST PORT 1 VIEW  Result Date: 07/18/2020 CLINICAL DATA:  Cough EXAM: PORTABLE CHEST 1 VIEW COMPARISON:  03/02/2020 chest radiograph and prior. FINDINGS: Cardiomediastinal silhouette within normal limits. Aortic atherosclerotic calcifications. Stable appearance of right mid lung calcified granuloma. No focal consolidation. No pneumothorax or pleural effusion. Multilevel spondylosis. Partially imaged ACDF and lumbar fixation hardware. IMPRESSION: No acute airspace disease. Electronically Signed   By: Primitivo Gauze M.D.   On: 07/18/2020 09:13    Assessment/Plan Seizures (HCC) Keppra Decreased at her request to 500 mg BID She says she is feeling much better with lower dose  Visual Hallucinations Resolved at this time after decreasing the dose of Keppra Follow up with Neuroology  ? TIA (transient ischemic attack) Got Plavi for 3 weeks On Aspirin and statin now  HTN (hypertension), benign On Low Dose of Norvasc BP Stable Hypothyroidism due to medication TSH normal in 7/21 Cough Continue on Mucinex Chest Xray is negative so far Hyponatremia Follow BMP  Left hip pain s/p IM  placement Follows with Dr Alvan Dame  Anxiety Will continue ativan PRN.  Will make Melatonin PRN Age-related osteoporosis with current pathological fracture, sequela Continue Bonivia  Family/ staff Communication:   Labs/tests ordered:

## 2020-07-25 ENCOUNTER — Encounter: Payer: Self-pay | Admitting: Adult Health

## 2020-07-25 ENCOUNTER — Ambulatory Visit (INDEPENDENT_AMBULATORY_CARE_PROVIDER_SITE_OTHER): Payer: Medicare Other | Admitting: Adult Health

## 2020-07-25 VITALS — BP 110/69 | HR 85 | Ht 60.0 in | Wt 92.8 lb

## 2020-07-25 DIAGNOSIS — G459 Transient cerebral ischemic attack, unspecified: Secondary | ICD-10-CM | POA: Diagnosis not present

## 2020-07-25 DIAGNOSIS — R441 Visual hallucinations: Secondary | ICD-10-CM

## 2020-07-25 DIAGNOSIS — R569 Unspecified convulsions: Secondary | ICD-10-CM

## 2020-07-25 LAB — BASIC METABOLIC PANEL
BUN: 12 (ref 4–21)
CO2: 27 — AB (ref 13–22)
Chloride: 97 — AB (ref 99–108)
Creatinine: 0.6 (ref 0.5–1.1)
Glucose: 97
Potassium: 4.5 (ref 3.4–5.3)
Sodium: 133 — AB (ref 137–147)

## 2020-07-25 LAB — CBC: RBC: 3.78 — AB (ref 3.87–5.11)

## 2020-07-25 LAB — CBC AND DIFFERENTIAL
HCT: 37 (ref 36–46)
Hemoglobin: 12.3 (ref 12.0–16.0)
Neutrophils Absolute: 3428
Platelets: 636 — AB (ref 150–399)
WBC: 5.9

## 2020-07-25 LAB — HEPATIC FUNCTION PANEL
ALT: 14 (ref 7–35)
AST: 19 (ref 13–35)
Alkaline Phosphatase: 63 (ref 25–125)
Bilirubin, Total: 0.5

## 2020-07-25 LAB — COMPREHENSIVE METABOLIC PANEL
Albumin: 3.9 (ref 3.5–5.0)
Calcium: 9.8 (ref 8.7–10.7)
Globulin: 2.6

## 2020-07-25 NOTE — Patient Instructions (Signed)
Repeat EEG - appt scheduled today  Visual hallucinations possibly could be due to medication side effect or possibly visual loss on your left side call release hallucinations - if these continue, would recommend further evaluation with your eye doctor to assess for visual loss  Continue keppra 500mg  twice daily  Continue aspirin 81 mg daily  and atorvastatin 20mg   for secondary stroke prevention  Continue to follow up with PCP regarding cholesterol and blood pressure management  Maintain strict control of hypertension with blood pressure goal below 130/90 and cholesterol with LDL cholesterol (bad cholesterol) goal below 70 mg/dL.      Followup in the future with me in 3 months or call earlier if needed       Thank you for coming to see [place] at [facility]. I hope we have been able to provide you high quality care today.  You may receive a patient satisfaction survey over the next few weeks. We would appreciate your feedback and comments so that we may continue to improve ourselves and the health of our patients.

## 2020-07-25 NOTE — Progress Notes (Signed)
[facility] [address] [place]. [place] [address] [phone]       HOSPITAL FOLLOW UP NOTE  Sara Davila Date of Birth:  December 21, 1929 Medical Record Number:  [id number]   Reason for Referral:  hospital stroke follow up    SUBJECTIVE:   CHIEF COMPLAINT:  Chief Complaint  Patient presents with  . Hospitalization Follow-up    HFU for possible stroke   . treatment room    with helper from nursing home     HPI:   Sara Davila is a 86 y.o. female with history of TIA and HTN who presented on 06/20/2019 with L sided weakness.   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  Recommended DAPT for 3 weeks and aspirin alone.  HTN stable.  LDL 94 initiate atorvastatin 20 mg daily.  No history of DM but A1c 6.1 indicating prediabetes.  Other stroke risk factors include advanced age, former tobacco use, EtOH use, hx TIA per pt, and family history of stroke.  Evaluated by therapy without therapy needs and discharged back home with plans on moving to ALF the following day.  R brain TIA likely from small vessel disease CT head No acute abnormality. Small vessel disease. Atrophy.   CTA head & neck no ELVO. L ICA bifurcation w/ minimal atherosclerosis. B cervical ICA tortuosity. Grade 2 anterolisthesis C7-11, CS fused above this level  MRI  No acute abnormality  2D Echo  normal  LDL 94  HgbA1c 6.1  VTE prophylaxis - Lovenox 30 mg sq daily   No antithrombotic prior to admission, now on aspirin 325 mg daily. Decrease aspirin to 81 and add plavix 75 mg daily. Continue DAPT x 3 weeks then aspirin alone.   Therapy recommendations:  ALF   Disposition:  Return to [facility] - from [facility] apt w/ plans to move to ALF tomorrow at [facility]  Today, 07/25/2020, Sara Davila is being seen for hospital follow-up accompanied by transportation She continues to reside at friend's home ALF  Since discharge, she  presented to ED on 07/11/2020 with recurrence of left-sided weakness, left hemianopia along with severe frontal headache associated with photophobia and nausea the day prior.  Symptoms apparently resolved after 20 minutes.  Neurology consulted who noted altered mental status with EEG reporting evidence of epileptogenicity arising from the right frontal region as well as cortical dysfunction in the right hemisphere likely secondary to underlying structural abnormality, postictal state.  MRI negative for acute abnormality.  Initiated Keppra 750 mg twice daily for new onset seizures.  Noted to be weak and deconditioned with PT recommending CIR but unfortunately insurance denied and discharged back to ALF with [facility] services.  She has been doing well since discharge without reoccurring left-sided weakness, visual changes or headache.  She does report concerns of visual hallucinations in her left periphery but when she would look straight on to image, she would no longer see image.  She denies visual impairment or deficit.  There was concern for keppra side effect from SNF provider therefore dosage lowered to 500 mg twice daily with improvement of her hallucinations.  She denies any other side effect or intolerance  She has remained on aspirin and atorvastatin for secondary stroke prevention without side effects Blood pressure today 110/69 HTN and HLD managed by facility  No further concerns at this time    ROS:   14 system review of systems performed and negative with  exception of seizures, visual hallucinations  PMH:  Past Medical History:  Diagnosis Date  . Abdominal pain 02/26/2017  . Anxiety   . Arthritis    Left Knee, Wrist, Back and Hips  . Cervical vertebral fusion   . Diverticulitis   . Diverticulosis   . Fibromyalgia   . GERD (gastroesophageal reflux disease)   . Gout 02/2018   L hand  . Hypothyroidism   . Pelvis fracture ([place])   . Peritoneal free air 03/29/2012  . Pneumonia   .  Pneumoperitoneum 02/26/2017  . Thyroid disorder   . TIA (transient ischemic attack)   . Toe pain 09/06/2016    PSH:  Past Surgical History:  Procedure Laterality Date  . ABDOMINAL HYSTERECTOMY    . BOWEL RESECTION N/A 07/09/2017   Procedure: SMALL BOWEL RESECTION;  Surgeon: Johnathan Hausen, MD;  Location: [facility];  Service: General;  Laterality: N/A;  . CERVICAL FUSION     x2  . FEMUR IM NAIL Left 03/03/2020   Procedure: INTRAMEDULLARY (IM) NAIL FEMORAL;  Surgeon: Paralee Cancel, MD;  Location: [facility];  Service: Orthopedics;  Laterality: Left;  . LAPAROSCOPIC APPENDECTOMY N/A 03/04/2017   Procedure: APPENDECTOMY LAPAROSCOPIC;  Surgeon: Johnathan Hausen, MD;  Location: [facility];  Service: General;  Laterality: N/A;  . LAPAROSCOPY N/A 03/04/2017   Procedure: LAPAROSCOPY DIAGNOSTIC, ENTEROLYSIS;  Surgeon: Johnathan Hausen, MD;  Location: [facility];  Service: General;  Laterality: N/A;  . LAPAROTOMY N/A 07/09/2017   Procedure: EXPLORATORY LAPAROTOMY;  Surgeon: Johnathan Hausen, MD;  Location: [facility];  Service: General;  Laterality: N/A;  . SPINE SURGERY     Lumbar- rod placement  . TONSILLECTOMY     86y/o  . TOTAL VAGINAL HYSTERECTOMY      Social History:  Social History   Socioeconomic History  . Marital status: Widowed    Spouse name: Not on file  . Number of children: 2  . Years of education: Therapist, sports  . Highest education level: Not on file  Occupational History  . Occupation: Retired   Tobacco Use  . Smoking status: Former Research scientist (life sciences)  . Smokeless tobacco: Never Used  . Tobacco comment: Smoked from age [age]  Vaping Use  . Vaping Use: Never used  Substance and Sexual Activity  . Alcohol use: Yes    Comment: occasional glass of wine  . Drug use: No  . Sexual activity: Not Currently  Other Topics Concern  . Not on file  Social History Narrative   Lives at [facility] independent living    Caffeine use: occasional cup of coffee, 2-3 times a week   Left handed         Diet: Regular       Do you drink/ eat things with caffeine? No      Marital status: Widowed                              What year were you married ? 1986      Do you live in a house, apartment,assistred living, condo, trailer, etc.)? [place]       Is it one or more stories? 4 th floor      How many persons live in your home ? 1      Do you have any pets in your home ?(please list) No      Highest Level of education completed: Registered Nurse       Current  or past profession: RN      Do you exercise?  Yes                            Type & how often 5-6 x WK      ADVANCED DIRECTIVES (Please bring copies)      Do you have a living will? Yes      Do you have a DNR form?   Yes                    If not, do you want to discuss one?       Do you have signed POA?HPOA forms?   Yes              If so, please bring to your appointment      FUNCTIONAL STATUS- To be completed by Spouse / child / Staff       Do you have difficulty bathing or dressing yourself ? No      Do you have difficulty preparing food or eating ?  No      Do you have difficulty managing your mediation ?No      Do you have difficulty managing your finances ? No      Do you have difficulty affording your medication ? No      Social Determinants of Health   Financial Resource Strain:   . Difficulty of Paying Living Expenses: Not on file  Food Insecurity:   . Worried About Charity fundraiser in the Last Year: Not on file  . Ran Out of Food in the Last Year: Not on file  Transportation Needs:   . Lack of Transportation (Medical): Not on file  . Lack of Transportation (Non-Medical): Not on file  Physical Activity:   . Days of Exercise per Week: Not on file  . Minutes of Exercise per Session: Not on file  Stress:   . Feeling of Stress : Not on file  Social Connections:   . Frequency of Communication with Friends and Family: Not on file  . Frequency of Social Gatherings with Friends and Family: Not on  file  . Attends Religious Services: Not on file  . Active Member of Clubs or Organizations: Not on file  . Attends Archivist Meetings: Not on file  . Marital Status: Not on file  Intimate Partner Violence:   . Fear of Current or Ex-Partner: Not on file  . Emotionally Abused: Not on file  . Physically Abused: Not on file  . Sexually Abused: Not on file    Family History:  Family History  Problem Relation Age of Onset  . Asthma Mother   . Pulmonary embolism Mother   . Macular degeneration Mother   . Heart disease Father   . Stroke Father   . Stroke Paternal Uncle   . Breast cancer Maternal Aunt   . Macular degeneration Sister   . Stroke Sister   . Neuropathy Neg Hx     Medications:   Current Outpatient Medications on File Prior to Visit  Medication Sig Dispense Refill  . Acetaminophen (TYLENOL EXTRA STRENGTH PO) Take 500 mg by mouth in the morning and at bedtime.     Marland Kitchen amLODipine (NORVASC) 2.5 MG tablet Take 1 tablet (2.5 mg total) by mouth daily. 60 tablet 0  . Apoaequorin (PREVAGEN) 10 MG CAPS Take 10 mg by mouth daily.    Marland Kitchen  aspirin EC 81 MG EC tablet Take 1 tablet (81 mg total) by mouth daily. Swallow whole. 360 tablet 0  . atorvastatin (LIPITOR) 20 MG tablet Take 1 tablet (20 mg total) by mouth daily. 90 tablet 0  . Calcium Carbonate (CALCIUM 500 PO) Take 1 tablet by mouth daily.    . Carboxymethylcellulose Sod PF (THERATEARS PF) 0.25 % SOLN Apply 1 drop to eye in the morning and at bedtime.    . diclofenac Sodium (VOLTAREN) 1 % GEL Apply topically in the morning and at bedtime. To knee     . docusate sodium (COLACE) 100 MG capsule Take 100 mg by mouth daily as needed for mild constipation.    Marland Kitchen guaiFENesin (MUCINEX) 600 MG 12 hr tablet Take 600 mg by mouth every 12 (twelve) hours as needed.    . ibandronate (BONIVA) 150 MG tablet Take 150 mg by mouth every 30 (thirty) days. Take in the morning with a full glass of water, on an empty stomach, and do not take  anything else by mouth or lie down for the next 30 min.    Marland Kitchen ibuprofen (ADVIL) 200 MG tablet Take 200 mg by mouth daily as needed for headache.     . levETIRAcetam (KEPPRA) 500 MG tablet Take 500 mg by mouth 2 (two) times daily.    Marland Kitchen levothyroxine (SYNTHROID) 75 MCG tablet Take 1 tablet (75 mcg total) by mouth daily before breakfast. 30 tablet 1  . LORazepam (ATIVAN) 0.5 MG tablet Take 0.5 tablets (0.25 mg total) by mouth 2 (two) times daily as needed for anxiety. 30 tablet 0  . melatonin 3 MG TABS tablet Take 1 tablet (3 mg total) by mouth at bedtime for 15 days. (Patient taking differently: Take 3 mg by mouth at bedtime as needed. ) 15 tablet 0  . mupirocin ointment (BACTROBAN) 2 % Apply 1 application topically daily as needed (nose sores).     Marland Kitchen omeprazole (PRILOSEC) 20 MG capsule Take 20 mg by mouth daily as needed (indigestion.).     Marland Kitchen polyethylene glycol (MIRALAX / GLYCOLAX) 17 g packet Take 17 g by mouth daily.     Marland Kitchen saccharomyces boulardii (FLORASTOR) 250 MG capsule Take 250 mg by mouth daily.     Marland Kitchen senna-docusate (SENOKOT-S) 8.6-50 MG tablet Take 2 tablets by mouth 2 (two) times daily.    . sodium fluoride (DENTAGEL) 1.1 % GEL dental gel Place 1 application onto teeth daily.    . traMADol (ULTRAM) 50 MG tablet Take 50 mg by mouth every 6 (six) hours as needed for moderate pain.    . Vitamin D, Cholecalciferol, 50 MCG (2000 UT) CAPS Take 1 tablet by mouth daily.    Marland Kitchen zolpidem (AMBIEN) 5 MG tablet Take 0.5 tablets (2.5 mg total) by mouth at bedtime as needed for sleep. (Patient taking differently: Take 2.5 mg by mouth at bedtime. ) 15 tablet 0   No current facility-administered medications on file prior to visit.    Allergies:   Allergies  Allergen Reactions  . Codeine Rash  . Penicillins Rash    Has patient had a PCN reaction causing immediate rash, facial/tongue/throat swelling, SOB or lightheadedness with hypotension: No Has patient had a PCN reaction causing severe rash involving  mucus membranes or skin necrosis: No Has patient had a PCN reaction that required hospitalization: No Has patient had a PCN reaction occurring within the last 10 years: No If all of the above answers are "NO", then may proceed with Cephalosporin use.   Marland Kitchen  Levaquin [Levofloxacin]       OBJECTIVE:  Physical Exam  Vitals:   07/25/20 0902  BP: 110/69  Pulse: 85  Weight: 92 lb 12.8 oz (42.1 kg)  Height: 5' (1.524 m)   Body mass index is 18.12 kg/m. No exam data present  General: Frail very pleasant elderly Caucasian female, seated, in no evident distress Head: head normocephalic and atraumatic.   Neck: supple with no carotid or supraclavicular bruits Cardiovascular: regular rate and rhythm, no murmurs Musculoskeletal: no deformity Skin:  no rash/petichiae Vascular:  Normal pulses all extremities   Neurologic Exam Mental Status: Awake and fully alert.   Fluent speech and language.  Oriented to place and time. Recent and remote memory intact. Attention span, concentration and fund of knowledge appropriate. Mood and affect appropriate.  Cranial Nerves: Fundoscopic exam reveals sharp disc margins. Pupils equal, briskly reactive to light. Extraocular movements full without nystagmus. Visual fields full to confrontation. Hearing intact. Facial sensation intact. Face, tongue, palate moves normally and symmetrically.  Motor: Normal bulk and tone. Normal strength in all tested extremity muscles. Sensory.: intact to touch , pinprick , position and vibratory sensation.  Coordination: Rapid alternating movements normal in all extremities. Finger-to-nose and heel-to-shin performed accurately bilaterally. Gait and Station: Arises from chair without difficulty. Stance is normal. Gait demonstrates normal stride length and balance with use of Rollator walker Reflexes: 1+ and symmetric. Toes downgoing.     NIHSS  0 Modified Rankin  0      ASSESSMENT: Sara Davila is a 86 y.o. year old  female presented with left-sided weakness on 06/20/2019 with stroke work-up largely unremarkable and symptoms likely related to right brain TIA secondary to small vessel disease.  Returned on 07/11/2020 with reoccurring left-sided weakness and visual impairment with MRI negative but EEG showed evidence of epileptogenicity arising from right frontal region and cortical dysfunction in right hemisphere.  Vascular risk factors include prior TIA history, HTN, HLD, prediabetes (A1c 6.1), advanced age, former tobacco use, and EtOH use.      PLAN:  1. R brain TIA :  a. Continue aspirin 81 mg daily  and atorvastatin 20 mg daily for secondary stroke prevention.  b. Close PCP follow up for aggressive stroke risk factor management  2. Seizures:  a. Continue Keppra 500 mg twice daily for seizure prophylaxis b. Repeat EEG 3. Visual hallucinations: a. Potentially medication side effect as hallucinations diminished after decreasing Keppra dosage but also concern for possible release hallucinations and she initially presented with left hemianopia and has hallucinations in her left periphery.  Recommend further evaluation by ophthalmology to fully assess visual fields if she continues to experience hallucinations.  Unable to appreciate visual impairment confrontation 4. HTN:  a. BP goal <130/90.   b. Stable today.   c. Managed by PCP. 5. HLD:  a. LDL goal <70. Recent LDL 94.   b. Continue atorvastatin 20 mg daily.   c. F/u with PCP for management as well as prescribing of statin    Follow up in 3 months or call earlier if needed   I spent 45 minutes of face-to-face and non-face-to-face time with patient.  This included previsit chart review, lab review, study review, order entry, electronic health record documentation, patient education regarding recent stroke, recent seizure activity, visual hallucinations, importance of managing stroke risk factors and answered all questions to patient  satisfaction     Frann Rider, [facility]  [facility] [address] [place], [place] [address]  Phone  [phone] Fax [phone] Note: This document was prepared with digital dictation and possible smart phrase technology. Any transcriptional errors that result from this process are unintentional.

## 2020-07-29 ENCOUNTER — Encounter: Payer: Self-pay | Admitting: Nurse Practitioner

## 2020-07-29 ENCOUNTER — Non-Acute Institutional Stay: Payer: Medicare Other | Admitting: Nurse Practitioner

## 2020-07-29 ENCOUNTER — Other Ambulatory Visit: Payer: Self-pay

## 2020-07-29 DIAGNOSIS — I10 Essential (primary) hypertension: Secondary | ICD-10-CM

## 2020-07-29 DIAGNOSIS — K219 Gastro-esophageal reflux disease without esophagitis: Secondary | ICD-10-CM

## 2020-07-29 DIAGNOSIS — E039 Hypothyroidism, unspecified: Secondary | ICD-10-CM

## 2020-07-29 DIAGNOSIS — R569 Unspecified convulsions: Secondary | ICD-10-CM

## 2020-07-29 DIAGNOSIS — F5105 Insomnia due to other mental disorder: Secondary | ICD-10-CM

## 2020-07-29 DIAGNOSIS — K5901 Slow transit constipation: Secondary | ICD-10-CM

## 2020-07-29 DIAGNOSIS — F419 Anxiety disorder, unspecified: Secondary | ICD-10-CM

## 2020-07-29 DIAGNOSIS — M25552 Pain in left hip: Secondary | ICD-10-CM

## 2020-07-29 DIAGNOSIS — E871 Hypo-osmolality and hyponatremia: Secondary | ICD-10-CM

## 2020-07-29 DIAGNOSIS — I872 Venous insufficiency (chronic) (peripheral): Secondary | ICD-10-CM | POA: Diagnosis not present

## 2020-07-29 NOTE — Assessment & Plan Note (Signed)
Edema BLE, Hx of it, will weight weekly, update CBC/diff, CMP/eGFR, will resume HCTZ 25mg 2x/wk, higher dose caused worsened hyponatremia.  

## 2020-07-29 NOTE — Assessment & Plan Note (Signed)
Stable, continue Levothyroxine.  

## 2020-07-29 NOTE — Progress Notes (Signed)
Location:   [facility] Room Number: 906 Place of Service:  ALF [phone]) Provider: Marlana Latus NP  [name], MD  Patient Care Team: [name], MD as PCP - General (Internal Medicine)  Extended Emergency Contact Information Primary Emergency Contact: Katherine Mantle of [place] Phone: [phone] Relation: Daughter Secondary Emergency Contact: Milon Dikes [place] of [place] Phone: [phone] Mobile Phone: [phone] Relation: Friend  Code Status: DNR Goals of care: Advanced Directive information Advanced Directives 07/29/2020  Does Patient Have a Medical Advance Directive? Yes  Type of Advance Directive Living will;Out of facility DNR (pink MOST or yellow form)  Does patient want to make changes to medical advance directive? No - Patient declined  Copy of [place] in Chart? -  Would patient like information on creating a medical advance directive? -  Pre-existing out of facility DNR order (yellow form or pink MOST form) Pink MOST form placed in chart (order not valid for inpatient use)     Chief Complaint  Patient presents with  . Acute Visit    swelling leg    HPI:  Pt is a 86 y.o. female seen today for an acute visit for edema BLE, Hx of it, denied change of chronic cough, SOB, chest pain/pressure, palpitation, no O2 desaturation. Hx of higher dose of HCTZ associated with hyponatremia.   Seizure, saw neurology 07/25/20, takes Keppra. Hospitalized 8/12-8/19 for seizure with presentation of left sided weakness again. EEG showed focal seizure right frontal region/.  Prior hospitalized 7/21-7/22 for questionable TIA with left sided weakness. Takes Keppra 575m bid since 07/23/20  Hallucination, resolved after Keppra dose was reduced.    HTN, takes Amlodipine.   Hypothyroidism, TSH wnl 05/2020, takes Levothyroxine  Hyponatremia, Na 130s.   Anxiety, not sleeping well, prn Lorazepam,  Zolpidem  GERD, takes Omeprazole.   Constipation, takes MiraLax, Senokot S  OA, left hip, s/p IM, takes Tylenol, prn Tramadol       Past Medical History:  Diagnosis Date  . Abdominal pain 02/26/2017  . Anxiety   . Arthritis    Left Knee, Wrist, Back and Hips  . Cervical vertebral fusion   . Diverticulitis   . Diverticulosis   . Fibromyalgia   . GERD (gastroesophageal reflux disease)   . Gout 02/2018   L hand  . Hypothyroidism   . [place]vis fracture [place]   . Peritoneal free air 03/29/2012  . Pneumonia   . Pneumoperitoneum 02/26/2017  . Thyroid disorder   . TIA (transient ischemic attack)   . Toe pain 09/06/2016   Past Surgical History:  Procedure Laterality Date  . ABDOMINAL HYSTERECTOMY    . BOWEL RESECTION N/A 07/09/2017   Procedure: SMALL BOWEL RESECTION;  Surgeon: MJohnathan Hausen MD;  Location: [facility];  Service: General;  Laterality: N/A;  . CERVICAL FUSION     x2  . FEMUR IM NAIL Left 03/03/2020   Procedure: INTRAMEDULLARY (IM) NAIL FEMORAL;  Surgeon: OParalee Cancel MD;  Location: [facility];  Service: Orthopedics;  Laterality: Left;  . LAPAROSCOPIC APPENDECTOMY N/A 03/04/2017   Procedure: APPENDECTOMY LAPAROSCOPIC;  Surgeon: MJohnathan Hausen MD;  Location: [facility];  Service: General;  Laterality: N/A;  . LAPAROSCOPY N/A 03/04/2017   Procedure: LAPAROSCOPY DIAGNOSTIC, ENTEROLYSIS;  Surgeon: MJohnathan Hausen MD;  Location: [facility];  Service: General;  Laterality: N/A;  . LAPAROTOMY N/A 07/09/2017   Procedure: EXPLORATORY LAPAROTOMY;  Surgeon: MJohnathan Hausen MD;  Location: [facility];  Service: General;  Laterality: N/A;  . SPINE SURGERY     Lumbar- rod placement  . TONSILLECTOMY     86y/o  . TOTAL VAGINAL HYSTERECTOMY      Allergies  Allergen Reactions  . Codeine Rash  . Penicillins Rash    Has patient had a PCN reaction causing immediate rash, facial/tongue/throat swelling, SOB or lightheadedness with hypotension: No Has patient had a PCN reaction causing severe rash involving  mucus membranes or skin necrosis: No Has patient had a PCN reaction that required hospitalization: No Has patient had a PCN reaction occurring within the last 10 years: No If all of the above answers are "NO", then may proceed with Cephalosporin use.   . Levaquin [Levofloxacin]     Allergies as of 07/29/2020      Reactions   Codeine Rash   Penicillins Rash   Has patient had a PCN reaction causing immediate rash, facial/tongue/throat swelling, SOB or lightheadedness with hypotension: No Has patient had a PCN reaction causing severe rash involving mucus membranes or skin necrosis: No Has patient had a PCN reaction that required hospitalization: No Has patient had a PCN reaction occurring within the last 10 years: No If all of the above answers are "NO", then may proceed with Cephalosporin use.   Levaquin [levofloxacin]       Medication List       Accurate as of July 29, 2020 11:59 PM. If you have any questions, ask your nurse or doctor.        amLODipine 2.5 MG tablet Commonly known as: NORVASC Take 1 tablet (2.5 mg total) by mouth daily.   aspirin 81 MG EC tablet Take 1 tablet (81 mg total) by mouth daily. Swallow whole.   atorvastatin 20 MG tablet Commonly known as: LIPITOR Take 1 tablet (20 mg total) by mouth daily.   CALCIUM 500 PO Take 1 tablet by mouth daily.   DentaGel 1.1 % Gel dental gel Generic drug: sodium fluoride Place 1 application onto teeth daily.   diclofenac Sodium 1 % Gel Commonly known as: VOLTAREN Apply topically in the morning and at bedtime. To knee   docusate sodium 100 MG capsule Commonly known as: COLACE Take 100 mg by mouth daily as needed for mild constipation.   hydrochlorothiazide 25 MG tablet Commonly known as: HYDRODIURIL Take 25 mg by mouth. Monday and Thursday   ibandronate 150 MG tablet Commonly known as: BONIVA Take 150 mg by mouth every 30 (thirty) days. Take in the morning with a full glass of water, on an empty stomach,  and do not take anything else by mouth or lie down for the next 30 min.   ibuprofen 200 MG tablet Commonly known as: ADVIL Take 200 mg by mouth daily as needed for headache.   levETIRAcetam 500 MG tablet Commonly known as: KEPPRA Take 500 mg by mouth 2 (two) times daily.   levothyroxine 75 MCG tablet Commonly known as: SYNTHROID Take 1 tablet (75 mcg total) by mouth daily before breakfast.   LORazepam 0.5 MG tablet Commonly known as: Ativan Take 0.5 tablets (0.25 mg total) by mouth 2 (two) times daily as needed for anxiety.   melatonin 3 MG Tabs tablet Take 1 tablet (3 mg total) by mouth at bedtime for 15 days. What changed:   when to take this  reasons to take this   MUCINEX PO Take 400 mg by mouth every 8 (eight) hours as needed. What changed: Another medication with the same name was removed. Continue taking this  medication, and follow the directions you see here. Changed by:  X , NP   mupirocin ointment 2 % Commonly known as: BACTROBAN Apply 1 application topically daily as needed (nose sores).   omeprazole 20 MG capsule Commonly known as: PRILOSEC Take 20 mg by mouth daily as needed (indigestion.).   polyethylene glycol 17 g packet Commonly known as: MIRALAX / GLYCOLAX Take 17 g by mouth daily.   Prevagen 10 MG Caps Generic drug: Apoaequorin Take 10 mg by mouth daily.   saccharomyces boulardii 250 MG capsule Commonly known as: FLORASTOR Take 250 mg by mouth daily.   senna-docusate 8.6-50 MG tablet Commonly known as: Senokot-S Take 2 tablets by mouth 2 (two) times daily.   Theratears PF 0.25 % Soln Generic drug: Carboxymethylcellulose Sod PF Apply 1 drop to eye in the morning and at bedtime.   traMADol 50 MG tablet Commonly known as: ULTRAM Take 50 mg by mouth every 6 (six) hours as needed for moderate pain.   TYLENOL EXTRA STRENGTH PO Take 500 mg by mouth in the morning and at bedtime.   Vitamin D (Cholecalciferol) 50 MCG (2000 UT)  Caps Take 1 tablet by mouth daily.   zolpidem 5 MG tablet Commonly known as: AMBIEN Take 0.5 tablets (2.5 mg total) by mouth at bedtime as needed for sleep. What changed: when to take this       Review of Systems  Constitutional: Negative for activity change, appetite change, fatigue and fever.  HENT: Negative for congestion and voice change.   Eyes: Negative for visual disturbance.  Respiratory: Positive for cough. Negative for chest tightness, shortness of breath and wheezing.        Chronic cough  Cardiovascular: Positive for leg swelling. Negative for palpitations.  Gastrointestinal: Negative for abdominal distention, abdominal pain and constipation.  Genitourinary: Negative for difficulty urinating, dysuria and urgency.       Bathroom trips 1-2/night  Musculoskeletal: Positive for arthralgias and gait problem. Negative for back pain.       Left hip pain, left groin, left thigh, chronic left knee pain-due for inj 03/21/20  Skin: Negative for color change.  Neurological: Negative for speech difficulty, weakness, light-headedness and headaches.  Psychiatric/Behavioral: Positive for sleep disturbance. Negative for behavioral problems, dysphoric mood and hallucinations. The patient is not nervous/anxious.     Immunization History  Administered Date(s) Administered  . Influenza-Unspecified 09/01/2018  . Moderna SARS-COVID-2 Vaccination 12/04/2019, 01/01/2020  . Zoster Recombinat (Shingrix) 07/05/2018   Pertinent  Health Maintenance Due  Topic Date Due  . DEXA SCAN  Never done  . PNA vac Low Risk Adult (1 of 2 - PCV13) Never done  . INFLUENZA VACCINE  06/30/2020   Fall Risk  06/14/2020 04/26/2020  Falls in the past year? 0 1  Number falls in past yr: 0 0  Injury with Fall? - 1   Functional Status Survey:    Vitals:   07/29/20 1358  BP: 120/60  Pulse: 82  Resp: 20  Temp: (!) 97.4 F (36.3 C)  SpO2: 95%  Weight: 89 lb (40.4 kg)  Height: 5' (1.524 m)   Body mass  index is 17.38 kg/m. Physical Exam Constitutional:      Appearance: Normal appearance.  HENT:     Head: Normocephalic and atraumatic.     Mouth/Throat:     Mouth: Mucous membranes are moist.  Eyes:     Extraocular Movements: Extraocular movements intact.     Conjunctiva/sclera: Conjunctivae normal.     Pupils: Pupils  are equal, round, and reactive to light.  Cardiovascular:     Rate and Rhythm: Normal rate and regular rhythm.     Heart sounds: No murmur heard.      Comments: Weak left DP pulse, more symptomatic tingling sensation at night.  Pulmonary:     Effort: Pulmonary effort is normal.     Breath sounds: Rales present.     Comments: Left basilare rales.  Abdominal:     General: Bowel sounds are normal.     Palpations: Abdomen is soft.     Tenderness: There is no abdominal tenderness. There is no right CVA tenderness or left CVA tenderness.  Musculoskeletal:     Cervical back: Normal range of motion and neck supple.     Right lower leg: Edema present.     Left lower leg: Edema present.     Comments: Moderate edema BLE,  mild scoliosis. Left hip/groin/thigh/knee pain is improved.   Skin:    General: Skin is warm and dry.     Comments: S/p L hip IM nail.  Neurological:     General: No focal deficit present.     Mental Status: She is alert and oriented to person, place, and time. Mental status is at baseline.     Gait: Gait abnormal.     Comments: Using walker sometimes.   Psychiatric:        Mood and Affect: Mood normal.        Behavior: Behavior normal.        Thought Content: Thought content normal.        Judgment: Judgment normal.     Labs reviewed: Recent Labs    07/12/20 0729 07/12/20 0729 07/13/20 0255 07/14/20 0307 07/25/20 0000  NA 131*   < > 133* 134* 133*  K 3.6   < > 3.7 4.0 4.5  CL 97*   < > 101 102 97*  CO2 24   < > 24 23 27*  GLUCOSE 99  --  108* 108*  --   BUN 11   < > _0 CREATININE 0.67   < > 0.72 0.65 0.6  CALCIUM 9.4   < > 8.7*  9.1 9.8   < > = values in this interval not displayed.   Recent Labs    04/17/20 0000 06/13/20 0700 06/19/20 1248 07/11/20 0914 07/25/20 0000  AST   < > _1 ALT   < > _2 ALKPHOS   < >  --  55 62 63  BILITOT  --  0.6 0.6 0.8  --   PROT  --  6.6 6.9 6.8  --   ALBUMIN   < >  --  3.7 3.9 3.9   < > = values in this interval not displayed.   Recent Labs    06/19/20 2151 06/19/20 2151 06/20/20 0421 07/11/20 0914 07/25/20 0000  WBC 6.0   < > 6.3 7.1 5.9  NEUTROABS  --   --  3.6 5.5 3,428  HGB 12.6   < > 12.0 13.2 12.3  HCT 38.9   < > 36.6 40.3 37  MCV 97.5  --  95.8 96.0  --   PLT 418*   < > 397 521* 636*   < > = values in this interval not displayed.   Lab Results  Component Value Date   TSH 2.389 06/19/2020   Lab Results  Component Value Date   HGBA1C 6.1 (  H) 06/20/2020   Lab Results  Component Value Date   CHOL 171 06/20/2020   HDL 59 06/20/2020   LDLCALC 94 06/20/2020   TRIG 89 06/20/2020   CHOLHDL 2.9 06/20/2020    Significant Diagnostic Results in last 30 days:  EEG  Result Date: 07/11/2020 Lora Havens, MD     07/11/2020  4:21 PM Patient Name: SHEFALI NG MRN: [id number] Epilepsy Attending: Lora Havens Referring Physician/Provider: Dr. Varney Baas Date: 07/11/2020 Duration: 24 mins Patient history: 86 year old female who presented with left-sided weakness and left hemianopia.  EEG evaluate for seizures. Level of alertness: Awake AEDs during EEG study: None Technical aspects: This EEG study was done with scalp electrodes positioned according to the 10-20 International system of electrode placement. Electrical activity was acquired at a sampling rate of 500Hz and reviewed with a high frequency filter of 70Hz and a low frequency filter of 1Hz. EEG data were recorded continuously and digitally stored. Description: No posterior dominant rhythm was seen. EEG showed 8-9 z alpha activity in left hemisphere and continuous 3-5hz theta-delta  activity in right hemisphere which at times appear rhythmic and sharply contoured without definite evolution. Sharp waves were also seen in right frontal region.  Hyperventilation and photic stimulation were not performed.   ABNORMALITY - Sharp wave, right frontal region -Continuous slow, right hemisphere IMPRESSION: This study showed evidence of epileptogenicity arising from right frontal region as well as cortical dysfunction in right hemisphere likely secondary to underlying structural abnormality, post-ictal state. Lora Havens   CT Code Stroke CTA Head W/WO contrast  Result Date: 07/11/2020 CLINICAL DATA:  Headache. Blurred vision. Left upper extremity weakness. EXAM: CT ANGIOGRAPHY HEAD AND NECK CT PERFUSION BRAIN TECHNIQUE: Multidetector CT imaging of the head and neck was performed using the standard protocol during bolus administration of intravenous contrast. Multiplanar CT image reconstructions and MIPs were obtained to evaluate the vascular anatomy. Carotid stenosis measurements (when applicable) are obtained utilizing NASCET criteria, using the distal internal carotid diameter as the denominator. Multiphase CT imaging of the brain was performed following IV bolus contrast injection. Subsequent parametric perfusion maps were calculated using RAPID software. CONTRAST:  126m OMNIPAQUE IOHEXOL 350 MG/ML SOLN COMPARISON:  Head CT earlier same day. FINDINGS: CTA NECK FINDINGS Aortic arch: Aortic atherosclerosis and tortuosity. No aneurysm or dissection. Branching pattern is normal without origin stenosis. Right carotid system: Common carotid artery widely patent to the bifurcation. Carotid bifurcation is widely patent without soft or calcified plaque. Cervical ICA widely patent. Left carotid system: Common carotid artery widely patent to the bifurcation. Minimal calcified plaque at the carotid bifurcation but no stenosis. Cervical ICA widely patent. Vertebral arteries: Both vertebral artery origins  are widely patent. Both vertebral arteries appeared widely patent and normal through the cervical region to the foramen magnum. Skeleton: Distant cervical fusion. Chronic degenerative anterolisthesis at C7-T1. Old minor superior endplate deformity at T6. Other neck: No mass or lymphadenopathy. Upper chest:  Scarring at the lung apices. No active process. Review of the MIP images confirms the above findings CTA HEAD FINDINGS Anterior circulation: Both internal carotid arteries are widely patent through the skull base and siphon regions. No siphon stenosis. The anterior and middle cerebral vessels are normal without proximal stenosis, aneurysm or vascular malformation. No large or medium vessel occlusion. Posterior circulation: Both vertebral arteries are widely patent to the basilar. No basilar stenosis. Posterior circulation branch vessels appear normal. Venous sinuses: Patent and normal. Anatomic variants: None Review of the MIP images confirms  the above findings CT Brain Perfusion Findings: ASPECTS: 10 CBF (<30%) Volume: 52m Perfusion (Tmax>6.0s) volume: 076mMismatch Volume: 4m39mnfarction Location:None IMPRESSION: 1. No large or medium vessel occlusion. Normal perfusion study. 2. Aortic atherosclerosis. 3. Minimal calcified plaque at the left carotid bifurcation but no stenosis. Aortic Atherosclerosis (ICD10-I70.0). Electronically Signed   By: MarNelson ChimesD.   On: 07/11/2020 12:30   CT HEAD WO CONTRAST  Result Date: 07/11/2020 CLINICAL DATA:  TIA this morning, headache, blurry vision, left upper extremity weakness now resolved. No reported injury. EXAM: CT HEAD WITHOUT CONTRAST TECHNIQUE: Contiguous axial images were obtained from the base of the skull through the vertex without intravenous contrast. COMPARISON:  06/19/2020 head CT FINDINGS: Brain: No evidence of parenchymal hemorrhage or extra-axial fluid collection. No mass lesion, mass effect, or midline shift. No CT evidence of acute infarction.  Generalized cerebral volume loss. Nonspecific moderate subcortical and periventricular white matter hypodensity, most in keeping with chronic small vessel ischemic change. No ventriculomegaly. Vascular: No acute abnormality. Skull: No evidence of calvarial fracture. Sinuses/Orbits: The visualized paranasal sinuses are essentially clear. Other:  The mastoid air cells are unopacified. IMPRESSION: 1. No evidence of acute intracranial abnormality. 2. Generalized cerebral volume loss and moderate chronic small vessel ischemic changes in the cerebral white matter. Electronically Signed   By: JasIlona SorrelD.   On: 07/11/2020 09:50   CT Code Stroke CTA Neck W/WO contrast  Result Date: 07/11/2020 CLINICAL DATA:  Headache. Blurred vision. Left upper extremity weakness. EXAM: CT ANGIOGRAPHY HEAD AND NECK CT PERFUSION BRAIN TECHNIQUE: Multidetector CT imaging of the head and neck was performed using the standard protocol during bolus administration of intravenous contrast. Multiplanar CT image reconstructions and MIPs were obtained to evaluate the vascular anatomy. Carotid stenosis measurements (when applicable) are obtained utilizing NASCET criteria, using the distal internal carotid diameter as the denominator. Multiphase CT imaging of the brain was performed following IV bolus contrast injection. Subsequent parametric perfusion maps were calculated using RAPID software. CONTRAST:  104m64mNIPAQUE IOHEXOL 350 MG/ML SOLN COMPARISON:  Head CT earlier same day. FINDINGS: CTA NECK FINDINGS Aortic arch: Aortic atherosclerosis and tortuosity. No aneurysm or dissection. Branching pattern is normal without origin stenosis. Right carotid system: Common carotid artery widely patent to the bifurcation. Carotid bifurcation is widely patent without soft or calcified plaque. Cervical ICA widely patent. Left carotid system: Common carotid artery widely patent to the bifurcation. Minimal calcified plaque at the carotid bifurcation but  no stenosis. Cervical ICA widely patent. Vertebral arteries: Both vertebral artery origins are widely patent. Both vertebral arteries appeared widely patent and normal through the cervical region to the foramen magnum. Skeleton: Distant cervical fusion. Chronic degenerative anterolisthesis at C7-T1. Old minor superior endplate deformity at T6. Other neck: No mass or lymphadenopathy. Upper chest:  Scarring at the lung apices. No active process. Review of the MIP images confirms the above findings CTA HEAD FINDINGS Anterior circulation: Both internal carotid arteries are widely patent through the skull base and siphon regions. No siphon stenosis. The anterior and middle cerebral vessels are normal without proximal stenosis, aneurysm or vascular malformation. No large or medium vessel occlusion. Posterior circulation: Both vertebral arteries are widely patent to the basilar. No basilar stenosis. Posterior circulation branch vessels appear normal. Venous sinuses: Patent and normal. Anatomic variants: None Review of the MIP images confirms the above findings CT Brain Perfusion Findings: ASPECTS: 10 CBF (<30%) Volume: 4mL 56mfusion (Tmax>6.0s) volume: 4mL M79match Volume: 4mL In59mction Location:None IMPRESSION: 1. No large or  medium vessel occlusion. Normal perfusion study. 2. Aortic atherosclerosis. 3. Minimal calcified plaque at the left carotid bifurcation but no stenosis. Aortic Atherosclerosis (ICD10-I70.0). Electronically Signed   By: Nelson Chimes M.D.   On: 07/11/2020 12:30   MR BRAIN W WO CONTRAST  Result Date: 07/11/2020 CLINICAL DATA:  Left-sided weakness neglect EXAM: MRI HEAD WITHOUT AND WITH CONTRAST TECHNIQUE: Multiplanar, multiecho pulse sequences of the brain and surrounding structures were obtained without and with intravenous contrast. CONTRAST:  576m GADAVIST GADOBUTROL 1 MMOL/ML IV SOLN COMPARISON:  06/19/2020 FINDINGS: Motion artifact is present. Brain: There is no acute infarction or intracranial  hemorrhage. There is no intracranial mass, mass effect, or edema. There is no hydrocephalus or extra-axial fluid collection. Prominence of the ventricles and sulci reflects generalized parenchymal volume loss similar to the prior study. Patchy T2 hyperintensity in the supratentorial white matter is nonspecific but probably reflects stable chronic microvascular ischemic changes. There is no abnormal enhancement. Vascular: Major vessel flow voids at the skull base are preserved. Skull and upper cervical spine: Normal marrow signal is preserved. Sinuses/Orbits: Paranasal sinuses are aerated. Bilateral lens replacements. Other: Sella is unremarkable.  Mastoid air cells are clear. IMPRESSION: Motion degraded. No acute intracranial abnormality. Stable chronic findings detailed above. Electronically Signed   By: PMacy MisM.D.   On: 07/11/2020 18:45   CT Code Stroke Cerebral Perfusion with contrast  Result Date: 07/11/2020 CLINICAL DATA:  Headache. Blurred vision. Left upper extremity weakness. EXAM: CT ANGIOGRAPHY HEAD AND NECK CT PERFUSION BRAIN TECHNIQUE: Multidetector CT imaging of the head and neck was performed using the standard protocol during bolus administration of intravenous contrast. Multiplanar CT image reconstructions and MIPs were obtained to evaluate the vascular anatomy. Carotid stenosis measurements (when applicable) are obtained utilizing NASCET criteria, using the distal internal carotid diameter as the denominator. Multiphase CT imaging of the brain was performed following IV bolus contrast injection. Subsequent parametric perfusion maps were calculated using RAPID software. CONTRAST:  1016mOMNIPAQUE IOHEXOL 350 MG/ML SOLN COMPARISON:  Head CT earlier same day. FINDINGS: CTA NECK FINDINGS Aortic arch: Aortic atherosclerosis and tortuosity. No aneurysm or dissection. Branching pattern is normal without origin stenosis. Right carotid system: Common carotid artery widely patent to the  bifurcation. Carotid bifurcation is widely patent without soft or calcified plaque. Cervical ICA widely patent. Left carotid system: Common carotid artery widely patent to the bifurcation. Minimal calcified plaque at the carotid bifurcation but no stenosis. Cervical ICA widely patent. Vertebral arteries: Both vertebral artery origins are widely patent. Both vertebral arteries appeared widely patent and normal through the cervical region to the foramen magnum. Skeleton: Distant cervical fusion. Chronic degenerative anterolisthesis at C7-T1. Old minor superior endplate deformity at T6. Other neck: No mass or lymphadenopathy. Upper chest:  Scarring at the lung apices. No active process. Review of the MIP images confirms the above findings CTA HEAD FINDINGS Anterior circulation: Both internal carotid arteries are widely patent through the skull base and siphon regions. No siphon stenosis. The anterior and middle cerebral vessels are normal without proximal stenosis, aneurysm or vascular malformation. No large or medium vessel occlusion. Posterior circulation: Both vertebral arteries are widely patent to the basilar. No basilar stenosis. Posterior circulation branch vessels appear normal. Venous sinuses: Patent and normal. Anatomic variants: None Review of the MIP images confirms the above findings CT Brain Perfusion Findings: ASPECTS: 10 CBF (<30%) Volume: 76m85merfusion (Tmax>6.0s) volume: 76mL72msmatch Volume: 76mL 71marction Location:None IMPRESSION: 1. No large or medium vessel occlusion. Normal perfusion study. 2.  Aortic atherosclerosis. 3. Minimal calcified plaque at the left carotid bifurcation but no stenosis. Aortic Atherosclerosis (ICD10-I70.0). Electronically Signed   By: Nelson Chimes M.D.   On: 07/11/2020 12:30   DG CHEST PORT 1 VIEW  Result Date: 07/18/2020 CLINICAL DATA:  Cough EXAM: PORTABLE CHEST 1 VIEW COMPARISON:  03/02/2020 chest radiograph and prior. FINDINGS: Cardiomediastinal silhouette within  normal limits. Aortic atherosclerotic calcifications. Stable appearance of right mid lung calcified granuloma. No focal consolidation. No pneumothorax or pleural effusion. Multilevel spondylosis. Partially imaged ACDF and lumbar fixation hardware. IMPRESSION: No acute airspace disease. Electronically Signed   By: Primitivo Gauze M.D.   On: 07/18/2020 09:13    Assessment/Plan: Venous insufficiency Edema BLE, Hx of it, will weight weekly, update CBC/diff, CMP/eGFR, will resume HCTZ 35m 2x/wk, higher dose caused worsened hyponatremia.   HTN (hypertension), benign Blood pressure is controlled, continue Amlodipine.   GERD (gastroesophageal reflux disease) Stable, continue Omeprazole.   Slow transit constipation Stable, continue Senokot S, MiraLax.   Hypothyroidism Stable, continue Levothyroxine.   Hyponatremia Na 133 07/25/20  Insomnia secondary to anxiety Worsened since Keppra, continue Zolpidem, Alprazolam.   Left hip pain S/p IM, continue Tylenol, prn Tramadol  Seizure ([place]izure, saw neurology 07/25/20, takes Keppra. Hospitalized 8/12-8/19 for seizure with presentation of left sided weakness again. EEG showed focal seizure right frontal region/.  Prior hospitalized 7/21-7/22 for questionable TIA with left sided weakness. Takes Keppra 5054mbid since 07/23/20    Family/ staff Communication: plan of care reviewed with the patient and charge nurse.   Labs/tests ordered:  CBC/diff, CMP/eGFR  Time spend 40 minutes

## 2020-07-29 NOTE — Telephone Encounter (Signed)
Refill request received via fax for Lorazepam 0.25mg  twice daily prn anxiety and Zolpidem 5mg  tablet take 2.5mg (1/2 tablet) at bedtime prn sleep. Medications pended and routed to Dr. Lyndel Safe for approval.

## 2020-07-29 NOTE — Assessment & Plan Note (Signed)
Blood pressure is controlled, continue Amlodipine.  

## 2020-07-29 NOTE — Assessment & Plan Note (Signed)
Na 133 07/25/20

## 2020-07-29 NOTE — Assessment & Plan Note (Signed)
Seizure, saw neurology 07/25/20, takes Keppra. Hospitalized 8/12-8/19 for seizure with presentation of left sided weakness again. EEG showed focal seizure right frontal region/.  Prior hospitalized 7/21-7/22 for questionable TIA with left sided weakness. Takes Keppra 500mg  bid since 07/23/20

## 2020-07-29 NOTE — Assessment & Plan Note (Signed)
S/p IM, continue Tylenol, prn Tramadol

## 2020-07-29 NOTE — Assessment & Plan Note (Signed)
Stable, continue Omeprazole.  

## 2020-07-29 NOTE — Assessment & Plan Note (Signed)
Worsened since Keppra, continue Zolpidem, Alprazolam.

## 2020-07-29 NOTE — Assessment & Plan Note (Signed)
Stable, continue Senokot S, MiraLax 

## 2020-07-30 ENCOUNTER — Encounter: Payer: Self-pay | Admitting: Nurse Practitioner

## 2020-07-30 LAB — HEPATIC FUNCTION PANEL
ALT: 13 (ref 7–35)
AST: 17 (ref 13–35)
Alkaline Phosphatase: 66 (ref 25–125)
Bilirubin, Total: 0.8

## 2020-07-30 LAB — CBC AND DIFFERENTIAL
HCT: 37 (ref 36–46)
Hemoglobin: 12.8 (ref 12.0–16.0)
Neutrophils Absolute: 3900
Platelets: 519 — AB (ref 150–399)
WBC: 6.5

## 2020-07-30 LAB — BASIC METABOLIC PANEL
BUN: 13 (ref 4–21)
CO2: 26 — AB (ref 13–22)
Chloride: 98 — AB (ref 99–108)
Creatinine: 0.6 (ref 0.5–1.1)
Glucose: 87
Potassium: 4.7 (ref 3.4–5.3)
Sodium: 132 — AB (ref 137–147)

## 2020-07-30 LAB — COMPREHENSIVE METABOLIC PANEL
Albumin: 4 (ref 3.5–5.0)
Calcium: 9.8 (ref 8.7–10.7)
Globulin: 2.6

## 2020-07-30 LAB — CBC: RBC: 3.95 (ref 3.87–5.11)

## 2020-07-30 MED ORDER — LORAZEPAM 0.5 MG PO TABS
0.2500 mg | ORAL_TABLET | Freq: Two times a day (BID) | ORAL | 0 refills | Status: DC | PRN
Start: 2020-07-30 — End: 2020-11-04

## 2020-07-30 MED ORDER — ZOLPIDEM TARTRATE 5 MG PO TABS
2.5000 mg | ORAL_TABLET | Freq: Every evening | ORAL | 0 refills | Status: DC | PRN
Start: 2020-07-30 — End: 2020-09-30

## 2020-07-30 NOTE — Progress Notes (Signed)
I agree with the above plan 

## 2020-08-14 ENCOUNTER — Ambulatory Visit (INDEPENDENT_AMBULATORY_CARE_PROVIDER_SITE_OTHER): Payer: Medicare Other

## 2020-08-14 DIAGNOSIS — R569 Unspecified convulsions: Secondary | ICD-10-CM

## 2020-08-20 ENCOUNTER — Encounter: Payer: Self-pay | Admitting: Internal Medicine

## 2020-08-20 ENCOUNTER — Non-Acute Institutional Stay: Payer: Medicare Other | Admitting: Internal Medicine

## 2020-08-20 DIAGNOSIS — E039 Hypothyroidism, unspecified: Secondary | ICD-10-CM | POA: Diagnosis not present

## 2020-08-20 DIAGNOSIS — I872 Venous insufficiency (chronic) (peripheral): Secondary | ICD-10-CM

## 2020-08-20 DIAGNOSIS — I10 Essential (primary) hypertension: Secondary | ICD-10-CM | POA: Diagnosis not present

## 2020-08-20 DIAGNOSIS — R569 Unspecified convulsions: Secondary | ICD-10-CM

## 2020-08-20 DIAGNOSIS — M25552 Pain in left hip: Secondary | ICD-10-CM

## 2020-08-20 DIAGNOSIS — R600 Localized edema: Secondary | ICD-10-CM | POA: Diagnosis not present

## 2020-08-20 DIAGNOSIS — E871 Hypo-osmolality and hyponatremia: Secondary | ICD-10-CM

## 2020-08-20 NOTE — Progress Notes (Signed)
Location:   [place] Room Number: [place] of Service:  ALF [phone]) Provider:  Veleta Miners MD  [name], MD  Patient Care Team: [name], MD as PCP - General (Internal Medicine)  Extended Emergency Contact Information Primary Emergency Contact: Katherine Mantle of [place] Phone: [phone] Relation: Daughter Secondary Emergency Contact: Milon Dikes [place] of [place] Phone: [phone] Mobile Phone: [phone] Relation: Friend  Code Status:  DNR Goals of care: Advanced Directive information Advanced Directives 07/29/2020  Does Patient Have a Medical Advance Directive? Yes  Type of Advance Directive Living will;Out of facility DNR (pink MOST or yellow form)  Does patient want to make changes to medical advance directive? No - Patient declined  Copy of [place] in Chart? -  Would patient like information on creating a medical advance directive? -  Pre-existing out of facility DNR order (yellow form or pink MOST form) Pink MOST form placed in chart (order not valid for inpatient use)     Chief Complaint  Patient presents with  . Acute Visit    Swelling    HPI:  Pt is a 86 y.o. female seen today for an acute visit for Swelling in her Leg and Weakness  Patient was initially admitted from 7/21-7/22 for questionable TIA with?Left sided weakness She was discharged on dual therapy for 3 weeks and then on aspirin. She was again admitted from 8/12-8/19new onset seizures Also has a history of left trochanteric fracture in 4/21 And hypothyroidism, Osteoporosis, Arthritis and GERDand Depression and Anxiety also Insomnia  Patient wanted to see me today because she wants her hydrochlorothiazide to be increased to 3 times a week to help her lower extremity edema. She also wanted to know if I can increase her Synthroid and she thinks she feels weak because of low thyroid. Otherwise  staying stable in AL Has not had any visual hallucinations.  No  left-sided weakness. Continues to have left hip pain. Walks with a walker and has not had any falls    Past Medical History:  Diagnosis Date  . Abdominal pain 02/26/2017  . Anxiety   . Arthritis    Left Knee, Wrist, Back and Hips  . Cervical vertebral fusion   . Diverticulitis   . Diverticulosis   . Fibromyalgia   . GERD (gastroesophageal reflux disease)   . Gout 02/2018   L hand  . Hypothyroidism   . Pelvis fracture ([place])   . Peritoneal free air 03/29/2012  . Pneumonia   . Pneumoperitoneum 02/26/2017  . Thyroid disorder   . TIA (transient ischemic attack)   . Toe pain 09/06/2016   Past Surgical History:  Procedure Laterality Date  . ABDOMINAL HYSTERECTOMY    . BOWEL RESECTION N/A 07/09/2017   Procedure: SMALL BOWEL RESECTION;  Surgeon: Johnathan Hausen, MD;  Location: [facility];  Service: General;  Laterality: N/A;  . CERVICAL FUSION     x2  . FEMUR IM NAIL Left 03/03/2020   Procedure: INTRAMEDULLARY (IM) NAIL FEMORAL;  Surgeon: Paralee Cancel, MD;  Location: [facility];  Service: Orthopedics;  Laterality: Left;  . LAPAROSCOPIC APPENDECTOMY N/A 03/04/2017   Procedure: APPENDECTOMY LAPAROSCOPIC;  Surgeon: Johnathan Hausen, MD;  Location: [facility];  Service: General;  Laterality: N/A;  . LAPAROSCOPY N/A 03/04/2017   Procedure: LAPAROSCOPY DIAGNOSTIC, ENTEROLYSIS;  Surgeon: Johnathan Hausen, MD;  Location: [facility];  Service: General;  Laterality: N/A;  . LAPAROTOMY N/A 07/09/2017   Procedure: EXPLORATORY LAPAROTOMY;  Surgeon: Johnathan Hausen, MD;  Location: [facility];  Service: General;  Laterality: N/A;  . SPINE SURGERY     Lumbar- rod placement  . TONSILLECTOMY     86y/o  . TOTAL VAGINAL HYSTERECTOMY      Allergies  Allergen Reactions  . Codeine Rash  . Penicillins Rash    Has patient had a PCN reaction causing immediate rash, facial/tongue/throat swelling, SOB or lightheadedness with hypotension: No Has patient had a PCN  reaction causing severe rash involving mucus membranes or skin necrosis: No Has patient had a PCN reaction that required hospitalization: No Has patient had a PCN reaction occurring within the last 10 years: No If all of the above answers are "NO", then may proceed with Cephalosporin use.   . Levaquin [Levofloxacin]     Allergies as of 08/20/2020      Reactions   Codeine Rash   Penicillins Rash   Has patient had a PCN reaction causing immediate rash, facial/tongue/throat swelling, SOB or lightheadedness with hypotension: No Has patient had a PCN reaction causing severe rash involving mucus membranes or skin necrosis: No Has patient had a PCN reaction that required hospitalization: No Has patient had a PCN reaction occurring within the last 10 years: No If all of the above answers are "NO", then may proceed with Cephalosporin use.   Levaquin [levofloxacin]       Medication List       Accurate as of August 20, 2020 11:46 AM. If you have any questions, ask your nurse or doctor.        STOP taking these medications   Prevagen 10 MG Caps Generic drug: Apoaequorin Stopped by: [name], MD     TAKE these medications   amLODipine 2.5 MG tablet Commonly known as: NORVASC Take 1 tablet (2.5 mg total) by mouth daily.   aspirin 81 MG EC tablet Take 1 tablet (81 mg total) by mouth daily. Swallow whole.   atorvastatin 20 MG tablet Commonly known as: LIPITOR Take 1 tablet (20 mg total) by mouth daily.   CALCIUM 500 PO Take 1 tablet by mouth daily.   DentaGel 1.1 % Gel dental gel Generic drug: sodium fluoride Place 1 application onto teeth daily.   diclofenac Sodium 1 % Gel Commonly known as: VOLTAREN Apply topically 4 (four) times daily. To knee   docusate sodium 100 MG capsule Commonly known as: COLACE Take 100 mg by mouth daily as needed for mild constipation.   hydrochlorothiazide 25 MG tablet Commonly known as: HYDRODIURIL Take 25 mg by mouth. Monday and  Thursday   ibandronate 150 MG tablet Commonly known as: BONIVA Take 150 mg by mouth every 30 (thirty) days. Take in the morning with a full glass of water, on an empty stomach, and do not take anything else by mouth or lie down for the next 30 min.   ibuprofen 200 MG tablet Commonly known as: ADVIL Take 200 mg by mouth daily as needed for headache.   levETIRAcetam 500 MG tablet Commonly known as: KEPPRA Take 500 mg by mouth daily.   levothyroxine 75 MCG tablet Commonly known as: SYNTHROID Take 1 tablet (75 mcg total) by mouth daily before breakfast.   LORazepam 0.5 MG tablet Commonly known as: Ativan Take 0.5 tablets (0.25 mg total) by mouth 2 (two) times daily as needed for anxiety.   melatonin 3 MG Tabs tablet Take 3 mg by mouth at bedtime.   MUCINEX PO Take 400 mg by mouth every 8 (eight)  hours as needed.   mupirocin ointment 2 % Commonly known as: BACTROBAN Apply 1 application topically daily as needed (nose sores).   omeprazole 20 MG capsule Commonly known as: PRILOSEC Take 20 mg by mouth daily as needed (indigestion.).   polyethylene glycol 17 g packet Commonly known as: MIRALAX / GLYCOLAX Take 17 g by mouth daily.   saccharomyces boulardii 250 MG capsule Commonly known as: FLORASTOR Take 250 mg by mouth daily.   senna-docusate 8.6-50 MG tablet Commonly known as: Senokot-S Take 2 tablets by mouth 2 (two) times daily.   Theratears PF 0.25 % Soln Generic drug: Carboxymethylcellulose Sod PF Apply 1 drop to eye in the morning and at bedtime.   traMADol 50 MG tablet Commonly known as: ULTRAM Take 50 mg by mouth every 6 (six) hours as needed for moderate pain.   TYLENOL EXTRA STRENGTH PO Take 500 mg by mouth in the morning and at bedtime.   Vitamin D (Cholecalciferol) 50 MCG (2000 UT) Caps Take 1 tablet by mouth daily.   zolpidem 5 MG tablet Commonly known as: AMBIEN Take 0.5 tablets (2.5 mg total) by mouth at bedtime as needed for sleep.        Review of Systems  Constitutional: Positive for activity change. Negative for unexpected weight change.  HENT: Negative.   Respiratory: Negative.   Cardiovascular: Positive for leg swelling.  Gastrointestinal: Negative.   Genitourinary: Negative.   Musculoskeletal: Positive for arthralgias and myalgias.  Skin: Negative.   Neurological: Positive for weakness.  Psychiatric/Behavioral: Positive for sleep disturbance. The patient is nervous/anxious.     Immunization History  Administered Date(s) Administered  . Influenza-Unspecified 09/01/2018  . Moderna SARS-COVID-2 Vaccination 12/04/2019, 01/01/2020  . Zoster Recombinat (Shingrix) 07/05/2018   Pertinent  Health Maintenance Due  Topic Date Due  . DEXA SCAN  Never done  . PNA vac Low Risk Adult (1 of 2 - PCV13) Never done  . INFLUENZA VACCINE  06/30/2020   Fall Risk  06/14/2020 04/26/2020  Falls in the past year? 0 1  Number falls in past yr: 0 0  Injury with Fall? - 1   Functional Status Survey:    Vitals:   08/20/20 1042  BP: 116/64  Pulse: 82  Resp: 18  Temp: (!) 97.1 F (36.2 C)  SpO2: 96%  Weight: 93 lb 3.2 oz (42.3 kg)  Height: 5' (1.524 m)   Body mass index is 18.2 kg/m. Physical Exam Vitals reviewed.  Constitutional:      Appearance: Normal appearance.  HENT:     Head: Normocephalic.     Nose: Nose normal.     Mouth/Throat:     Mouth: Mucous membranes are moist.     Pharynx: Oropharynx is clear.  Eyes:     Pupils: Pupils are equal, round, and reactive to light.  Cardiovascular:     Rate and Rhythm: Normal rate and regular rhythm.     Pulses: Normal pulses.  Pulmonary:     Effort: Pulmonary effort is normal.     Breath sounds: Normal breath sounds.  Abdominal:     General: Abdomen is flat. Bowel sounds are normal.     Palpations: Abdomen is soft.  Musculoskeletal:     Cervical back: Neck supple.     Comments: Mild Swelling Bilateral  Skin:    General: Skin is warm and dry.  Neurological:      General: No focal deficit present.     Mental Status: She is alert and oriented to person, place,  and time.     Comments: Walks with the walker  Psychiatric:        Mood and Affect: Mood normal.        Thought Content: Thought content normal.     Labs reviewed: Recent Labs    07/12/20 0729 07/12/20 0729 07/13/20 0255 07/13/20 0255 07/14/20 0307 07/25/20 0000 07/30/20 0000  NA 131*   < > 133*   < > 134* 133* 132*  K 3.6   < > 3.7   < > 4.0 4.5 4.7  CL 97*   < > 101   < > 102 97* 98*  CO2 24   < > 24   < > 23 27* 26*  GLUCOSE 99  --  108*  --  108*  --   --   BUN 11   < > 16   < > 12 12 13   CREATININE 0.67   < > 0.72   < > 0.65 0.6 0.6  CALCIUM 9.4   < > 8.7*   < > 9.1 9.8 9.8   < > = values in this interval not displayed.   Recent Labs    04/17/20 0000 06/13/20 0700 06/19/20 1248 06/19/20 1248 07/11/20 0914 07/25/20 0000 07/30/20 0000  AST   < > 19 24   < > 24 19 17   ALT   < > 13 14   < > 19 14 13   ALKPHOS   < >  --  55   < > 62 63 66  BILITOT  --  0.6 0.6  --  0.8  --   --   PROT  --  6.6 6.9  --  6.8  --   --   ALBUMIN   < >  --  3.7   < > 3.9 3.9 4.0   < > = values in this interval not displayed.   Recent Labs    06/19/20 2151 06/19/20 2151 06/20/20 0421 06/20/20 0421 07/11/20 0914 07/25/20 0000 07/30/20 0000  WBC 6.0   < > 6.3   < > 7.1 5.9 6.5  NEUTROABS  --   --  3.6   < > 5.5 3,428 3,900  HGB 12.6   < > 12.0   < > 13.2 12.3 12.8  HCT 38.9   < > 36.6   < > 40.3 37 37  MCV 97.5  --  95.8  --  96.0  --   --   PLT 418*   < > 397   < > 521* 636* 519*   < > = values in this interval not displayed.   Lab Results  Component Value Date   TSH 2.389 06/19/2020   Lab Results  Component Value Date   HGBA1C 6.1 (H) 06/20/2020   Lab Results  Component Value Date   CHOL 171 06/20/2020   HDL 59 06/20/2020   LDLCALC 94 06/20/2020   TRIG 89 06/20/2020   CHOLHDL 2.9 06/20/2020    Significant Diagnostic Results in last 30 days:  No results  found.  Assessment/Plan Bilateral leg edema Discontinue HCTZ Start on Lasix 10 mg QD with Potassium Repeat BMP in 1 week Hyponatremia Sodium today was 130 Repeat BMP after starting Lasix  Venous insufficiency Does not Like Ted hoses due to Fibromylagia Elevate Legs PRN  HTN (hypertension), benign On Norvasc Hypothyroidism, unspecified type Repeat TSH pending  Seizures ([place]) Keppra Long acting  Left hip pain Follows with Ortho  Hyperlipidemia On  Lipitor Repeat  Lipid Panel   Family/ staff Communication:   Labs/tests ordered:  Lipid Panel and BMP in 1 week

## 2020-08-26 ENCOUNTER — Institutional Professional Consult (permissible substitution): Payer: Medicare Other | Admitting: Neurology

## 2020-08-26 ENCOUNTER — Telehealth: Payer: Self-pay | Admitting: *Deleted

## 2020-08-26 NOTE — Telephone Encounter (Signed)
Pt came to office today for an appt with Dr Jaynee Eagles that had been previously canceled d/t pt seeing NP sooner. I reviewed EEG results with pt per result note from [place]. Pt has copy of EEG results released by medical records. Pt stated she has been taking Keppra 500 mg daily (down from 2x daily) for 2 weeks. Dose was decreased by the provider that sees her at her ALF. She stated the hallucinations have decreased since then. However she notes she has been "dreadfully tired" since she started the [place] and she has asked for [facility] NP to advise if the dose can be decreased even further since her EEG showed no seizure activity. I let pt know a message would be sent to [facility] NP and we would give her a call back. She verbalized appreciation.

## 2020-08-26 NOTE — Telephone Encounter (Signed)
I would not recommend further decreasing dosage as her prior EEG did show abnormality and although her recent EEG was normal this means that the Keppra is working appropriately.  She can try taking the Keppra dosage at nighttime to see if this helps.  Also, if she is only taking once daily this should be an XR formulation to ensure appropriate coverage throughout the 24 hours.  If she continues to experience drowsiness, we can consider switching to a different antiseizure medication.  Thank you.

## 2020-08-27 NOTE — Telephone Encounter (Signed)
JM NP reviewed last note from [facility] that states pt on Keppra long acting.

## 2020-08-27 NOTE — Telephone Encounter (Signed)
I called the pt and discussed the message from [facility] NP. Pt aware of the reason why Sara Davila does not recommend decreasing Keppra further and also aware of recommendation to try her Keppra dose at night instead of the morning. Pt was very appreciative of the information. She was advised if she continues to experience drowsiness we may consider switching to a different anti-seizure medication. Her questions were answered during the call.

## 2020-09-03 LAB — BASIC METABOLIC PANEL
BUN: 19 (ref 4–21)
CO2: 26 — AB (ref 13–22)
Chloride: 99 (ref 99–108)
Creatinine: 0.7 (ref 0.5–1.1)
Glucose: 65
Potassium: 4.4 (ref 3.4–5.3)
Sodium: 134 — AB (ref 137–147)

## 2020-09-03 LAB — COMPREHENSIVE METABOLIC PANEL: Calcium: 9.6 (ref 8.7–10.7)

## 2020-09-23 ENCOUNTER — Encounter: Payer: Self-pay | Admitting: Nurse Practitioner

## 2020-09-23 ENCOUNTER — Non-Acute Institutional Stay: Payer: Medicare Other | Admitting: Nurse Practitioner

## 2020-09-23 DIAGNOSIS — R569 Unspecified convulsions: Secondary | ICD-10-CM

## 2020-09-23 DIAGNOSIS — M25552 Pain in left hip: Secondary | ICD-10-CM

## 2020-09-23 DIAGNOSIS — M8000XS Age-related osteoporosis with current pathological fracture, unspecified site, sequela: Secondary | ICD-10-CM

## 2020-09-23 DIAGNOSIS — G459 Transient cerebral ischemic attack, unspecified: Secondary | ICD-10-CM

## 2020-09-23 DIAGNOSIS — I10 Essential (primary) hypertension: Secondary | ICD-10-CM

## 2020-09-23 DIAGNOSIS — E871 Hypo-osmolality and hyponatremia: Secondary | ICD-10-CM

## 2020-09-23 DIAGNOSIS — E039 Hypothyroidism, unspecified: Secondary | ICD-10-CM | POA: Diagnosis not present

## 2020-09-23 DIAGNOSIS — K5901 Slow transit constipation: Secondary | ICD-10-CM

## 2020-09-23 DIAGNOSIS — F5105 Insomnia due to other mental disorder: Secondary | ICD-10-CM

## 2020-09-23 DIAGNOSIS — K219 Gastro-esophageal reflux disease without esophagitis: Secondary | ICD-10-CM

## 2020-09-23 DIAGNOSIS — F419 Anxiety disorder, unspecified: Secondary | ICD-10-CM

## 2020-09-23 DIAGNOSIS — I872 Venous insufficiency (chronic) (peripheral): Secondary | ICD-10-CM

## 2020-09-23 NOTE — Progress Notes (Signed)
Location:   [place] Room Number: [place] of Service:  [id number]) Provider: Marda Stalker, Lennie Odor NP  [name], MD  Patient Care Team: [name], MD as PCP - General (Internal Medicine)  Extended Emergency Contact Information Primary Emergency Contact: Katherine Mantle of [place] Phone: [phone] Relation: Daughter Secondary Emergency Contact: Milon Dikes [place] of [place] Phone: [phone] Mobile Phone: [phone] Relation: Friend  Code Status:  DNR Goals of care: Advanced Directive information Advanced Directives 07/29/2020  Does Patient Have a Medical Advance Directive? Yes  Type of Advance Directive Living will;Out of facility DNR (pink MOST or yellow form)  Does patient want to make changes to medical advance directive? No - Patient declined  Copy of [place] in Chart? -  Would patient like information on creating a medical advance directive? -  Pre-existing out of facility DNR order (yellow form or pink MOST form) Pink MOST form placed in chart (order not valid for inpatient use)     Chief Complaint  Patient presents with  . Medical Management of Chronic Issues  . Health Maintenance    TDAP, PCV13, and Dexa scan    HPI:  Pt is a 86 y.o. female seen today for medical management of chronic diseases.      Edema BLE, takes Furosemide, Bun/creat 13/0.58 07/30/20             Seizure, saw neurology 07/25/20, takes Keppra. Hospitalized 8/12-8/19 for seizure with presentation of left sided weakness again. EEG showed focal seizure right frontal region. Takes Keppra 500mg  qd.              HTN, takes Amlodipine.              Hypothyroidism, TSH wnl 05/2020, takes Levothyroxine             Hyponatremia, Na 130s.              Anxiety, not sleeping well, prn Lorazepam, Zolpidem             GERD, takes Omeprazole.              Constipation, takes MiraLax, Senokot S, Colace             OA, left  hip, s/p IM, takes Tylenol, prn Tramadol  OP takes Boniva, Ca, pending DEXA  TIA, takes Atorvastatin, ASA.                  Past Medical History:  Diagnosis Date  . Abdominal pain 02/26/2017  . Anxiety   . Arthritis    Left Knee, Wrist, Back and Hips  . Cervical vertebral fusion   . Diverticulitis   . Diverticulosis   . Fibromyalgia   . GERD (gastroesophageal reflux disease)   . Gout 02/2018   L hand  . Hypothyroidism   . Pelvis fracture ([place])   . Peritoneal free air 03/29/2012  . Pneumonia   . Pneumoperitoneum 02/26/2017  . Thyroid disorder   . TIA (transient ischemic attack)   . Toe pain 09/06/2016   Past Surgical History:  Procedure Laterality Date  . ABDOMINAL HYSTERECTOMY    . BOWEL RESECTION N/A 07/09/2017   Procedure: SMALL BOWEL RESECTION;  Surgeon: Johnathan Hausen, MD;  Location: [facility];  Service: General;  Laterality: N/A;  . CERVICAL FUSION     x2  . FEMUR IM NAIL Left 03/03/2020   Procedure: INTRAMEDULLARY (IM) NAIL FEMORAL;  Surgeon: Alvan Dame,  Rodman Key, MD;  Location: [facility];  Service: Orthopedics;  Laterality: Left;  . LAPAROSCOPIC APPENDECTOMY N/A 03/04/2017   Procedure: APPENDECTOMY LAPAROSCOPIC;  Surgeon: Johnathan Hausen, MD;  Location: [facility];  Service: General;  Laterality: N/A;  . LAPAROSCOPY N/A 03/04/2017   Procedure: LAPAROSCOPY DIAGNOSTIC, ENTEROLYSIS;  Surgeon: Johnathan Hausen, MD;  Location: [facility];  Service: General;  Laterality: N/A;  . LAPAROTOMY N/A 07/09/2017   Procedure: EXPLORATORY LAPAROTOMY;  Surgeon: Johnathan Hausen, MD;  Location: [facility];  Service: General;  Laterality: N/A;  . SPINE SURGERY     Lumbar- rod placement  . TONSILLECTOMY     86y/o  . TOTAL VAGINAL HYSTERECTOMY      Allergies  Allergen Reactions  . Codeine Rash  . Penicillins Rash    Has patient had a PCN reaction causing immediate rash, facial/tongue/throat swelling, SOB or lightheadedness with hypotension: No Has patient had a PCN reaction causing severe rash involving mucus  membranes or skin necrosis: No Has patient had a PCN reaction that required hospitalization: No Has patient had a PCN reaction occurring within the last 10 years: No If all of the above answers are "NO", then may proceed with Cephalosporin use.   . Levaquin [Levofloxacin]     Allergies as of 09/23/2020      Reactions   Codeine Rash   Penicillins Rash   Has patient had a PCN reaction causing immediate rash, facial/tongue/throat swelling, SOB or lightheadedness with hypotension: No Has patient had a PCN reaction causing severe rash involving mucus membranes or skin necrosis: No Has patient had a PCN reaction that required hospitalization: No Has patient had a PCN reaction occurring within the last 10 years: No If all of the above answers are "NO", then may proceed with Cephalosporin use.   Levaquin [levofloxacin]       Medication List       Accurate as of September 23, 2020 11:59 PM. If you have any questions, ask your nurse or doctor.        STOP taking these medications   amLODipine 2.5 MG tablet Commonly known as: NORVASC Stopped by: Suleima Ohlendorf X Elisia Stepp, NP   diclofenac Sodium 1 % Gel Commonly known as: VOLTAREN Stopped by: Efrata Brunner X Alphonsus Doyel, NP   hydrochlorothiazide 25 MG tablet Commonly known as: HYDRODIURIL Stopped by: Violanda Bobeck X Margaurite Salido, NP   ibuprofen 200 MG tablet Commonly known as: ADVIL Stopped by: Siniyah Evangelist X Nakenya Theall, NP   melatonin 3 MG Tabs tablet Stopped by: Lonzy Mato X Katrenia Alkins, NP   mupirocin ointment 2 % Commonly known as: BACTROBAN Stopped by: Rayburn Mundis X Fermon Ureta, NP   Theratears PF 0.25 % Soln Generic drug: Carboxymethylcellulose Sod PF Stopped by: Ervin Hensley X Nneka Blanda, NP   TYLENOL EXTRA STRENGTH PO Stopped by: Luvern Mcisaac X Neesha Langton, NP     TAKE these medications   aspirin 81 MG EC tablet Take 1 tablet (81 mg total) by mouth daily. Swallow whole.   atorvastatin 20 MG tablet Commonly known as: LIPITOR Take 1 tablet (20 mg total) by mouth daily.   CALCIUM 500 PO Take 1 tablet by mouth daily.   DentaGel  1.1 % Gel dental gel Generic drug: sodium fluoride Place 1 application onto teeth daily.   docusate sodium 100 MG capsule Commonly known as: COLACE Take 100 mg by mouth daily as needed for mild constipation.   furosemide 20 MG tablet Commonly known as: LASIX Take 10 mg by mouth daily.   ibandronate 150 MG tablet Commonly known as: BONIVA Take 150 mg by  mouth every 30 (thirty) days. Take in the morning with a full glass of water, on an empty stomach, and do not take anything else by mouth or lie down for the next 30 min.   levETIRAcetam 500 MG tablet Commonly known as: KEPPRA Take 500 mg by mouth daily.   levothyroxine 75 MCG tablet Commonly known as: SYNTHROID Take 1 tablet (75 mcg total) by mouth daily before breakfast.   LORazepam 0.5 MG tablet Commonly known as: Ativan Take 0.5 tablets (0.25 mg total) by mouth 2 (two) times daily as needed for anxiety.   MUCINEX PO Take 400 mg by mouth every 8 (eight) hours as needed.   omeprazole 20 MG capsule Commonly known as: PRILOSEC Take 20 mg by mouth daily as needed (indigestion.).   polyethylene glycol 17 g packet Commonly known as: MIRALAX / GLYCOLAX Take 17 g by mouth daily.   potassium chloride 10 MEQ CR capsule Commonly known as: MICRO-K Take 10 mEq by mouth daily.   saccharomyces boulardii 250 MG capsule Commonly known as: FLORASTOR Take 250 mg by mouth daily.   senna-docusate 8.6-50 MG tablet Commonly known as: Senokot-S Take 2 tablets by mouth 2 (two) times daily.   traMADol 50 MG tablet Commonly known as: ULTRAM Take 50 mg by mouth every 6 (six) hours as needed for moderate pain.   vitamin D3 50 MCG (2000 UT) Caps Take 1 tablet by mouth daily.   zolpidem 5 MG tablet Commonly known as: AMBIEN Take 0.5 tablets (2.5 mg total) by mouth at bedtime as needed for sleep.       Review of Systems  Constitutional: Negative for fatigue, fever and unexpected weight change.  HENT: Negative for congestion and  voice change.   Eyes: Negative for visual disturbance.  Respiratory: Positive for cough. Negative for shortness of breath.        Chronic cough  Cardiovascular: Positive for leg swelling. Negative for palpitations.  Gastrointestinal: Negative for abdominal pain and constipation.  Genitourinary: Negative for dysuria and urgency.       Bathroom trips 1-2/night  Musculoskeletal: Positive for arthralgias and gait problem. Negative for back pain.       Left hip pain, left groin, left thigh, chronic left knee pain-due for inj 03/21/20  Skin: Negative for color change.  Neurological: Negative for dizziness, seizures, speech difficulty, weakness and headaches.  Psychiatric/Behavioral: Positive for sleep disturbance. Negative for behavioral problems and hallucinations. The patient is not nervous/anxious.     Immunization History  Administered Date(s) Administered  . Influenza-Unspecified 09/01/2018, 09/10/2020  . Moderna SARS-COVID-2 Vaccination 12/04/2019, 01/01/2020  . Zoster Recombinat (Shingrix) 07/05/2018   Pertinent  Health Maintenance Due  Topic Date Due  . DEXA SCAN  Never done  . PNA vac Low Risk Adult (1 of 2 - PCV13) Never done  . INFLUENZA VACCINE  Completed   Fall Risk  06/14/2020 04/26/2020  Falls in the past year? 0 1  Number falls in past yr: 0 0  Injury with Fall? - 1   Functional Status Survey:    Vitals:   09/23/20 1131  BP: 110/60  Pulse: 80  Resp: 18  Temp: 98 F (36.7 C)  SpO2: 95%  Weight: 94 lb 3.2 oz (42.7 kg)  Height: 5' (1.524 m)   Body mass index is 18.4 kg/m. Physical Exam Constitutional:      Appearance: Normal appearance.  HENT:     Head: Normocephalic and atraumatic.     Mouth/Throat:     Mouth: Mucous  membranes are moist.  Eyes:     Conjunctiva/sclera: Conjunctivae normal.     Pupils: Pupils are equal, round, and reactive to light.  Cardiovascular:     Rate and Rhythm: Normal rate and regular rhythm.     Heart sounds: No murmur heard.       Comments: Weak left DP pulse, more symptomatic tingling sensation at night.  Pulmonary:     Effort: Pulmonary effort is normal.     Breath sounds: Rales present.     Comments: Left basilare rales.  Abdominal:     General: Bowel sounds are normal.     Palpations: Abdomen is soft.     Tenderness: There is no abdominal tenderness. There is no left CVA tenderness.  Musculoskeletal:     Cervical back: Normal range of motion and neck supple.     Right lower leg: Edema present.     Left lower leg: Edema present.     Comments: Moderate edema BLE,  mild scoliosis. Left hip/groin/thigh/knee pain is improved.   Skin:    General: Skin is warm and dry.     Comments: S/p L hip IM nail.  Neurological:     General: No focal deficit present.     Mental Status: She is alert and oriented to person, place, and time. Mental status is at baseline.     Gait: Gait abnormal.     Comments: Using walker sometimes.   Psychiatric:        Mood and Affect: Mood normal.        Behavior: Behavior normal.        Thought Content: Thought content normal.        Judgment: Judgment normal.     Labs reviewed: Recent Labs    07/12/20 0729 07/12/20 0729 07/13/20 0255 07/13/20 0255 07/14/20 0307 07/25/20 0000 07/30/20 0000  NA 131*   < > 133*   < > 134* 133* 132*  K 3.6   < > 3.7   < > 4.0 4.5 4.7  CL 97*   < > 101   < > 102 97* 98*  CO2 24   < > 24   < > 23 27* 26*  GLUCOSE 99  --  108*  --  108*  --   --   BUN 11   < > 16   < > 12 12 13   CREATININE 0.67   < > 0.72   < > 0.65 0.6 0.6  CALCIUM 9.4   < > 8.7*   < > 9.1 9.8 9.8   < > = values in this interval not displayed.   Recent Labs    04/17/20 0000 06/13/20 0700 06/19/20 1248 06/19/20 1248 07/11/20 0914 07/25/20 0000 07/30/20 0000  AST   < > 19 24   < > 24 19 17   ALT   < > 13 14   < > 19 14 13   ALKPHOS   < >  --  55   < > 62 63 66  BILITOT  --  0.6 0.6  --  0.8  --   --   PROT  --  6.6 6.9  --  6.8  --   --   ALBUMIN   < >  --  3.7    < > 3.9 3.9 4.0   < > = values in this interval not displayed.   Recent Labs    06/19/20 2151 06/19/20 2151 06/20/20 0421 06/20/20 0421 07/11/20 0914 07/25/20 0000  07/30/20 0000  WBC 6.0   < > 6.3   < > 7.1 5.9 6.5  NEUTROABS  --   --  3.6   < > 5.5 3,428 3,900  HGB 12.6   < > 12.0   < > 13.2 12.3 12.8  HCT 38.9   < > 36.6   < > 40.3 37 37  MCV 97.5  --  95.8  --  96.0  --   --   PLT 418*   < > 397   < > 521* 636* 519*   < > = values in this interval not displayed.   Lab Results  Component Value Date   TSH 2.389 06/19/2020   Lab Results  Component Value Date   HGBA1C 6.1 (H) 06/20/2020   Lab Results  Component Value Date   CHOL 171 06/20/2020   HDL 59 06/20/2020   LDLCALC 94 06/20/2020   TRIG 89 06/20/2020   CHOLHDL 2.9 06/20/2020    Significant Diagnostic Results in last 30 days:  No results found.  Assessment/Plan Venous insufficiency Edema BLE, minimal, will reduce  Furosemide/Kcl to M, W, F  Seizure [facility]) Seizure, saw neurology 07/25/20, takes Keppra. Hospitalized 8/12-8/19 for seizure with presentation of left sided weakness again. EEG showed focal seizure right frontal region. Takes Keppra 500mg  qd.    HTN (hypertension), benign  HTN, takes Amlodipine. 08/27/20 LDL 52, the patient desires to reduce Atorvastatin to 10mg  qd.   Hypothyroidism Hypothyroidism, TSH wnl 05/2020, takes Levothyroxine  Hyponatremia Hyponatremia, Na 130s.   Insomnia secondary to anxiety Anxiety, not sleeping well, prn Lorazepam, Zolpidem   GERD (gastroesophageal reflux disease) GERD, takes Omeprazole. Wants to get off FloraStor.    Slow transit constipation Constipation, takes MiraLax, Senokot S, Colace   Left hip pain  OA, left hip, s/p IM, takes Tylenol, prn Tramadol. Pain in left hip/thigh mostly at night, see Ortho Fri   Osteoporosis OP takes Boniva, Ca, pending DEXA,   TIA (transient ischemic attack) TIA, takes Atorvastatin, ASA.       Family/ staff  Communication: plan of care reviewed with the patient and charge nurse.   Labs/tests ordered:  none  Time spend 40 minutes.

## 2020-09-24 ENCOUNTER — Encounter: Payer: Self-pay | Admitting: Nurse Practitioner

## 2020-09-24 NOTE — Assessment & Plan Note (Signed)
Constipation, takes MiraLax, Senokot S, Colace

## 2020-09-24 NOTE — Assessment & Plan Note (Addendum)
HTN, takes Amlodipine. 08/27/20 LDL 52, the patient desires to reduce Atorvastatin to 10mg  qd.

## 2020-09-24 NOTE — Assessment & Plan Note (Signed)
Anxiety, not sleeping well, prn Lorazepam, Zolpidem

## 2020-09-24 NOTE — Assessment & Plan Note (Signed)
Hyponatremia, Na 130s.

## 2020-09-24 NOTE — Assessment & Plan Note (Signed)
TIA, takes Atorvastatin, ASA.

## 2020-09-24 NOTE — Assessment & Plan Note (Addendum)
GERD, takes Omeprazole. Wants to get off FloraStor.

## 2020-09-24 NOTE — Assessment & Plan Note (Addendum)
Edema BLE, minimal, will reduce  Furosemide/Kcl to M, W, F

## 2020-09-24 NOTE — Assessment & Plan Note (Signed)
Seizure, saw neurology 07/25/20, takes Keppra. Hospitalized 8/12-8/19 for seizure with presentation of left sided weakness again. EEG showed focal seizure right frontal region. Takes Keppra 500mg  qd.

## 2020-09-24 NOTE — Assessment & Plan Note (Addendum)
OA, left hip, s/p IM, takes Tylenol, prn Tramadol. Pain in left hip/thigh mostly at night, see Ortho Fri

## 2020-09-24 NOTE — Assessment & Plan Note (Signed)
Hypothyroidism, TSH wnl 05/2020, takes Levothyroxine

## 2020-09-24 NOTE — Assessment & Plan Note (Signed)
OP takes Boniva, Ca, pending DEXA,

## 2020-09-25 ENCOUNTER — Encounter: Payer: Self-pay | Admitting: Nurse Practitioner

## 2020-09-30 ENCOUNTER — Other Ambulatory Visit: Payer: Self-pay

## 2020-09-30 MED ORDER — ZOLPIDEM TARTRATE 5 MG PO TABS: 2.5000 mg | ORAL_TABLET | Freq: Every evening | ORAL | 0 refills | Status: DC | PRN

## 2020-09-30 NOTE — Telephone Encounter (Signed)
Refill request received for Zolpidem (Ambien) 5 mg tablet take 1/2 tablet (2.5 mg) at bedtime as needed for sleep. Medication pended and sent to Dr. Lyndel Safe for approval.

## 2020-10-17 ENCOUNTER — Encounter (HOSPITAL_COMMUNITY): Payer: Self-pay

## 2020-10-17 ENCOUNTER — Encounter (HOSPITAL_COMMUNITY): Payer: Self-pay | Admitting: Orthopedic Surgery

## 2020-10-17 NOTE — Patient Instructions (Addendum)
DUE TO COVID-19 ONLY ONE VISITOR IS ALLOWED TO COME WITH YOU AND STAY IN THE WAITING ROOM ONLY DURING PRE OP AND PROCEDURE DAY OF SURGERY. THE 1 VISITOR  MAY VISIT WITH YOU AFTER SURGERY IN YOUR PRIVATE ROOM DURING VISITING HOURS ONLY!  YOU NEED TO HAVE A COVID 19 TEST ON__11-29-21_____ @_______ , THIS TEST MUST BE DONE BEFORE SURGERY,  COVID TESTING SITE [address] [place] [address],   ONCE YOUR COVID TEST IS COMPLETED,  PLEASE BEGIN THE QUARANTINE INSTRUCTIONS AS OUTLINED IN YOUR HANDOUT.  If your COVID test is not done and you do not quarantine your surgery will be cancelled                Sara Davila  10/17/2020   Your procedure is scheduled on:  10-31-20   Report to [facility] Main  Entrance   Report to admitting at        [place] AM     Call this number if you have problems the morning of surgery [phone]    Remember: NO SOLID FOOD AFTER MIDNIGHT THE NIGHT PRIOR TO SURGERY.   NOTHING BY MOUTH EXCEPT CLEAR LIQUIDS UNTIL   0640 am  . PLEASE FINISH G2  DRINK PER SURGEON ORDER  WHICH NEEDS TO BE COMPLETED AT    0640 am then nothing by mouth  .    CLEAR LIQUID DIET       Until 640 am  The morning of your surgery then nothing by mouth   Foods Allowed                                                                                Black Coffee and tea, regular and decaf                                               Plain Jell-O any favor except red or purple                                         Fruit ices (not with fruit pulp)                                                         Iced Popsicles                                                            Carbonated beverages, regular and diet  Cranberry, grape and apple juices Sports drinks like Gatorade Lightly seasoned clear broth or consume(fat free) Sugar, honey syrup   _____________________________________________________________________    BRUSH YOUR  TEETH MORNING OF SURGERY AND RINSE YOUR MOUTH OUT, NO CHEWING GUM CANDY OR MINTS.     Take these medicines the morning of surgery with A SIP OF WATER: levothyroxine, omeprazole, levetiracetam( keppra), lipitor                                 You may not have any metal on your body including hair pins and              piercings  Do not wear jewelry, make-up, lotions, powders or perfumes, deodorant             Do not wear nail polish on your fingernails.  Do not shave  48 hours prior to surgery.     Do not bring valuables to the hospital. [place].  Contacts, dentures or bridgework may not be worn into surgery.      Patients discharged the day of surgery will not be allowed to drive home. IF YOU ARE HAVING SURGERY AND GOING HOME THE SAME DAY, YOU MUST HAVE AN ADULT TO DRIVE YOU HOME AND BE WITH YOU FOR 24 HOURS. YOU MAY GO HOME BY TAXI OR UBER OR ORTHERWISE, BUT AN ADULT MUST ACCOMPANY YOU HOME AND STAY WITH YOU FOR 24 HOURS.  Name and phone number of your driver:  Special Instructions: N/A              Please read over the following fact sheets you were given: _____________________________________________________________________             [facility] - Preparing for Surgery Before surgery, you can play an important role.  Because skin is not sterile, your skin needs to be as free of germs as possible.  You can reduce the number of germs on your skin by washing with CHG (chlorahexidine gluconate) soap before surgery.  CHG is an antiseptic cleaner which kills germs and bonds with the skin to continue killing germs even after washing. Please DO NOT use if you have an allergy to CHG or antibacterial soaps.  If your skin becomes reddened/irritated stop using the CHG and inform your nurse when you arrive at Short Stay. Do not shave (including legs and underarms) for at least 48 hours prior to the first CHG shower.  You may shave your  face/neck. Please follow these instructions carefully:  1.  Shower with CHG Soap the night before surgery and the  morning of Surgery.  2.  If you choose to wash your hair, wash your hair first as usual with your  normal  shampoo.  3.  After you shampoo, rinse your hair and body thoroughly to remove the  shampoo.                           4.  Use CHG as you would any other liquid soap.  You can apply chg directly  to the skin and wash                       Gently with a scrungie or clean washcloth.  5.  Apply the CHG  Soap to your body ONLY FROM THE NECK DOWN.   Do not use on face/ open                           Wound or open sores. Avoid contact with eyes, ears mouth and genitals (private parts).                       Wash face,  Genitals (private parts) with your normal soap.             6.  Wash thoroughly, paying special attention to the area where your surgery  will be performed.  7.  Thoroughly rinse your body with warm water from the neck down.  8.  DO NOT shower/wash with your normal soap after using and rinsing off  the CHG Soap.                9.  Pat yourself dry with a clean towel.            10.  Wear clean pajamas.            11.  Place clean sheets on your bed the night of your first shower and do not  sleep with pets. Day of Surgery : Do not apply any lotions/deodorants the morning of surgery.  Please wear clean clothes to the hospital/surgery center.  FAILURE TO FOLLOW THESE INSTRUCTIONS MAY RESULT IN THE CANCELLATION OF YOUR SURGERY PATIENT SIGNATURE_________________________________  NURSE SIGNATURE__________________________________  ________________________________________________________________________   Sara Davila  An incentive spirometer is a tool that can help keep your lungs clear and active. This tool measures how well you are filling your lungs with each breath. Taking long deep breaths may help reverse or decrease the chance of developing breathing  (pulmonary) problems (especially infection) following:  A long period of time when you are unable to move or be active. BEFORE THE PROCEDURE   If the spirometer includes an indicator to show your best effort, your nurse or respiratory therapist will set it to a desired goal.  If possible, sit up straight or lean slightly forward. Try not to slouch.  Hold the incentive spirometer in an upright position. INSTRUCTIONS FOR USE  1. Sit on the edge of your bed if possible, or sit up as far as you can in bed or on a chair. 2. Hold the incentive spirometer in an upright position. 3. Breathe out normally. 4. Place the mouthpiece in your mouth and seal your lips tightly around it. 5. Breathe in slowly and as deeply as possible, raising the piston or the ball toward the top of the column. 6. Hold your breath for 3-5 seconds or for as long as possible. Allow the piston or ball to fall to the bottom of the column. 7. Remove the mouthpiece from your mouth and breathe out normally. 8. Rest for a few seconds and repeat Steps 1 through 7 at least 10 times every 1-2 hours when you are awake. Take your time and take a few normal breaths between deep breaths. 9. The spirometer may include an indicator to show your best effort. Use the indicator as a goal to work toward during each repetition. 10. After each set of 10 deep breaths, practice coughing to be sure your lungs are clear. If you have an incision (the cut made at the time of surgery), support your incision when coughing by placing a pillow or rolled up towels  firmly against it. Once you are able to get out of bed, walk around indoors and cough well. You may stop using the incentive spirometer when instructed by your caregiver.  RISKS AND COMPLICATIONS  Take your time so you do not get dizzy or light-headed.  If you are in pain, you may need to take or ask for pain medication before doing incentive spirometry. It is harder to take a deep breath if you  are having pain. AFTER USE  Rest and breathe slowly and easily.  It can be helpful to keep track of a log of your progress. Your caregiver can provide you with a simple table to help with this. If you are using the spirometer at home, follow these instructions: [place] IF:   You are having difficultly using the spirometer.  You have trouble using the spirometer as often as instructed.  Your pain medication is not giving enough relief while using the spirometer.  You develop fever of 100.5 F (38.1 C) or higher. SEEK IMMEDIATE MEDICAL CARE IF:   You cough up bloody sputum that had not been present before.  You develop fever of 102 F (38.9 C) or greater.  You develop worsening pain at or near the incision site. MAKE SURE YOU:   Understand these instructions.  Will watch your condition.  Will get help right away if you are not doing well or get worse. Document Released: 03/29/2007 Document Revised: 02/08/2012 Document Reviewed: 05/30/2007 [facility] Patient Information 2014 [place], [place].   ________________________________________________________________________

## 2020-10-17 NOTE — Progress Notes (Addendum)
PCP - Veleta Miners, MD Cardiologist - no  PPM/ICD -  Device Orders -  Rep Notified -   Chest x-ray - 07-18-20 epic EKG - 07-11-20 epic Stress Test -  ECHO -06-20-20 epic  Cardiac Cath -   Sleep Study -  CPAP -   Fasting Blood Sugar -  Checks Blood Sugar _____ times a day  Blood Thinner Instructions: Aspirin Instructions:  ERAS Protcol - PRE-SURGERY G2-   COVID TEST- 10-28-20  Activity can do housework without SOB Anesthesia review: Resides at [facility]  (ALF) Hx. HTN, TIA , Seizure,  K+ 5.6  Patient denies shortness of breath, fever, cough and chest pain at PAT appointment   NONE  All instructions explained to the patient, with a verbal understanding of the material. Patient agrees to go over the instructions while at home for a better understanding. Patient also instructed to self quarantine after being tested for COVID-19. The opportunity to ask questions was provided.

## 2020-10-21 ENCOUNTER — Encounter (HOSPITAL_COMMUNITY)
Admission: RE | Admit: 2020-10-21 | Discharge: 2020-10-21 | Disposition: A | Discharge status: Home / Self Care | Payer: Medicare Other | Source: Ambulatory Visit | Attending: Orthopedic Surgery | Admitting: Orthopedic Surgery

## 2020-10-21 ENCOUNTER — Other Ambulatory Visit: Payer: Self-pay

## 2020-10-21 ENCOUNTER — Encounter (HOSPITAL_COMMUNITY): Payer: Self-pay

## 2020-10-21 ENCOUNTER — Encounter (INDEPENDENT_AMBULATORY_CARE_PROVIDER_SITE_OTHER): Payer: Self-pay

## 2020-10-21 DIAGNOSIS — Z01812 Encounter for preprocedural laboratory examination: Secondary | ICD-10-CM | POA: Insufficient documentation

## 2020-10-21 HISTORY — DX: Prediabetes: R73.03

## 2020-10-21 HISTORY — DX: Essential (primary) hypertension: I10

## 2020-10-21 LAB — BASIC METABOLIC PANEL
Anion gap: 8 (ref 5–15)
BUN: 23 mg/dL (ref 8–23)
CO2: 28 mmol/L (ref 22–32)
Calcium: 9.8 mg/dL (ref 8.9–10.3)
Chloride: 97 mmol/L — ABNORMAL LOW (ref 98–111)
Creatinine, Ser: 0.71 mg/dL (ref 0.44–1.00)
GFR, Estimated: >60 mL/min (ref >60)
Glucose, Bld: 92 mg/dL (ref 70–99)
Potassium: 5.6 mmol/L — ABNORMAL HIGH (ref 3.5–5.1)
Sodium: 133 mmol/L — ABNORMAL LOW (ref 135–145)

## 2020-10-21 LAB — HEMOGLOBIN A1C
Hgb A1c MFr Bld: 5.8 % — ABNORMAL HIGH (ref 4.8–5.6)
Mean Plasma Glucose: 119.76 mg/dL

## 2020-10-21 LAB — CBC
HCT: 39 % (ref 36.0–46.0)
Hemoglobin: 13 g/dL (ref 12.0–15.0)
MCH: 32.7 pg (ref 26.0–34.0)
MCHC: 33.3 g/dL (ref 30.0–36.0)
MCV: 98 fL (ref 80.0–100.0)
Platelets: 410 10*3/uL — ABNORMAL HIGH (ref 150–400)
RBC: 3.98 MIL/uL (ref 3.87–5.11)
RDW: 12.4 % (ref 11.5–15.5)
WBC: 5.7 10*3/uL (ref 4.0–10.5)
nRBC: 0 % (ref 0.0–0.2)

## 2020-10-21 LAB — SURGICAL PCR SCREEN
MRSA, PCR: NEGATIVE
Staphylococcus aureus: NEGATIVE

## 2020-10-21 NOTE — Progress Notes (Signed)
Pt. From [facility]. They will bring her meals to her room and meds after covid test. Instructions given to patient and faxed to [facility].

## 2020-10-28 ENCOUNTER — Ambulatory Visit: Payer: Medicare Other | Admitting: Adult Health

## 2020-10-28 ENCOUNTER — Other Ambulatory Visit (HOSPITAL_COMMUNITY)
Admission: RE | Admit: 2020-10-28 | Discharge: 2020-10-28 | Disposition: A | Discharge status: Home / Self Care | Payer: Medicare Other | Source: Ambulatory Visit | Attending: Orthopedic Surgery | Admitting: Orthopedic Surgery

## 2020-10-28 DIAGNOSIS — Z01812 Encounter for preprocedural laboratory examination: Secondary | ICD-10-CM | POA: Diagnosis present

## 2020-10-28 DIAGNOSIS — Z20822 Contact with and (suspected) exposure to covid-19: Secondary | ICD-10-CM | POA: Diagnosis not present

## 2020-10-28 LAB — SARS CORONAVIRUS 2 (TAT 6-24 HRS): SARS Coronavirus 2: NEGATIVE

## 2020-10-30 NOTE — Anesthesia Preprocedure Evaluation (Addendum)
Anesthesia Evaluation  Patient identified by MRN, date of birth, ID band Patient awake    Reviewed: Allergy & Precautions, NPO status , Patient's Chart, lab work & pertinent test results  Airway Mallampati: II  TM Distance: >3 FB Neck ROM: Full    Dental  (+) Dental Advisory Given   Pulmonary former smoker,    breath sounds clear to auscultation       Cardiovascular hypertension, Pt. on medications  Rhythm:Regular Rate:Normal     Neuro/Psych Seizures -,  TIA Neuromuscular disease CVA    GI/Hepatic Neg liver ROS, GERD  ,  Endo/Other  Hypothyroidism   Renal/GU negative Renal ROS     Musculoskeletal  (+) Arthritis , Fibromyalgia -  Abdominal   Peds  Hematology  (+) anemia ,   Anesthesia Other Findings   Reproductive/Obstetrics                            Lab Results  Component Value Date   WBC 5.7 10/21/2020   HGB 13.0 10/21/2020   HCT 39.0 10/21/2020   MCV 98.0 10/21/2020   PLT 410 (H) 10/21/2020   Lab Results  Component Value Date   CREATININE 0.71 10/21/2020   BUN 23 10/21/2020   NA 133 (L) 10/21/2020   K 5.6 (H) 10/21/2020   CL 97 (L) 10/21/2020   CO2 28 10/21/2020    Anesthesia Physical Anesthesia Plan  ASA: III  Anesthesia Plan: General   Post-op Pain Management:    Induction: Intravenous  PONV Risk Score and Plan: 3 and Dexamethasone, Ondansetron and Treatment may vary due to age or medical condition  Airway Management Planned: Oral ETT  Additional Equipment: None  Intra-op Plan:   Post-operative Plan: Extubation in OR  Informed Consent: I have reviewed the patients History and Physical, chart, labs and discussed the procedure including the risks, benefits and alternatives for the proposed anesthesia with the patient or authorized representative who has indicated his/her understanding and acceptance.   Patient has DNR.  Suspend DNR and Discussed DNR with  patient.   Dental advisory given  Plan Discussed with: CRNA  Anesthesia Plan Comments: (Pt refuses spinal anesthesia)      Anesthesia Quick Evaluation

## 2020-10-31 ENCOUNTER — Inpatient Hospital Stay (HOSPITAL_COMMUNITY): Payer: Medicare Other

## 2020-10-31 ENCOUNTER — Inpatient Hospital Stay (HOSPITAL_COMMUNITY): Payer: Medicare Other | Admitting: Anesthesiology

## 2020-10-31 ENCOUNTER — Encounter (HOSPITAL_COMMUNITY): Payer: Self-pay | Admitting: Orthopedic Surgery

## 2020-10-31 ENCOUNTER — Inpatient Hospital Stay (HOSPITAL_COMMUNITY)
Admission: RE | Admit: 2020-10-31 | Discharge: 2020-11-04 | DRG: 470 | Disposition: A | Discharge status: Skilled Nursing Facility | Payer: Medicare Other | Attending: Orthopedic Surgery | Admitting: Orthopedic Surgery

## 2020-10-31 ENCOUNTER — Inpatient Hospital Stay (HOSPITAL_COMMUNITY): Payer: Medicare Other | Admitting: Physician Assistant

## 2020-10-31 ENCOUNTER — Other Ambulatory Visit: Payer: Self-pay

## 2020-10-31 ENCOUNTER — Encounter (HOSPITAL_COMMUNITY)
Admission: RE | Disposition: A | Discharge status: Skilled Nursing Facility | Payer: Self-pay | Source: Home / Self Care | Attending: Orthopedic Surgery

## 2020-10-31 DIAGNOSIS — Z885 Allergy status to narcotic agent status: Secondary | ICD-10-CM

## 2020-10-31 DIAGNOSIS — M109 Gout, unspecified: Secondary | ICD-10-CM | POA: Diagnosis present

## 2020-10-31 DIAGNOSIS — Z8673 Personal history of transient ischemic attack (TIA), and cerebral infarction without residual deficits: Secondary | ICD-10-CM

## 2020-10-31 DIAGNOSIS — T8489XA Other specified complication of internal orthopedic prosthetic devices, implants and grafts, initial encounter: Principal | ICD-10-CM | POA: Diagnosis present

## 2020-10-31 DIAGNOSIS — S72142P Displaced intertrochanteric fracture of left femur, subsequent encounter for closed fracture with malunion: Secondary | ICD-10-CM

## 2020-10-31 DIAGNOSIS — R7303 Prediabetes: Secondary | ICD-10-CM | POA: Diagnosis present

## 2020-10-31 DIAGNOSIS — F419 Anxiety disorder, unspecified: Secondary | ICD-10-CM | POA: Diagnosis present

## 2020-10-31 DIAGNOSIS — Z880 Allergy status to penicillin: Secondary | ICD-10-CM

## 2020-10-31 DIAGNOSIS — Z823 Family history of stroke: Secondary | ICD-10-CM

## 2020-10-31 DIAGNOSIS — Z825 Family history of asthma and other chronic lower respiratory diseases: Secondary | ICD-10-CM

## 2020-10-31 DIAGNOSIS — I10 Essential (primary) hypertension: Secondary | ICD-10-CM | POA: Diagnosis present

## 2020-10-31 DIAGNOSIS — Z981 Arthrodesis status: Secondary | ICD-10-CM

## 2020-10-31 DIAGNOSIS — Z8249 Family history of ischemic heart disease and other diseases of the circulatory system: Secondary | ICD-10-CM

## 2020-10-31 DIAGNOSIS — Z96649 Presence of unspecified artificial hip joint: Secondary | ICD-10-CM

## 2020-10-31 DIAGNOSIS — M1612 Unilateral primary osteoarthritis, left hip: Secondary | ICD-10-CM | POA: Diagnosis present

## 2020-10-31 DIAGNOSIS — M810 Age-related osteoporosis without current pathological fracture: Secondary | ICD-10-CM | POA: Diagnosis present

## 2020-10-31 DIAGNOSIS — D62 Acute posthemorrhagic anemia: Secondary | ICD-10-CM | POA: Diagnosis not present

## 2020-10-31 DIAGNOSIS — Z87891 Personal history of nicotine dependence: Secondary | ICD-10-CM

## 2020-10-31 DIAGNOSIS — Z20822 Contact with and (suspected) exposure to covid-19: Secondary | ICD-10-CM | POA: Diagnosis present

## 2020-10-31 DIAGNOSIS — M797 Fibromyalgia: Secondary | ICD-10-CM | POA: Diagnosis present

## 2020-10-31 DIAGNOSIS — E039 Hypothyroidism, unspecified: Secondary | ICD-10-CM | POA: Diagnosis present

## 2020-10-31 DIAGNOSIS — K219 Gastro-esophageal reflux disease without esophagitis: Secondary | ICD-10-CM | POA: Diagnosis present

## 2020-10-31 DIAGNOSIS — R54 Age-related physical debility: Secondary | ICD-10-CM | POA: Diagnosis present

## 2020-10-31 DIAGNOSIS — Z803 Family history of malignant neoplasm of breast: Secondary | ICD-10-CM

## 2020-10-31 DIAGNOSIS — Z419 Encounter for procedure for purposes other than remedying health state, unspecified: Secondary | ICD-10-CM

## 2020-10-31 HISTORY — PX: CONVERSION TO TOTAL HIP: SHX5784

## 2020-10-31 HISTORY — DX: Unspecified convulsions: R56.9

## 2020-10-31 LAB — TYPE AND SCREEN
ABO/RH(D): A NEG
Antibody Screen: NEGATIVE

## 2020-10-31 LAB — ABO/RH: ABO/RH(D): A NEG

## 2020-10-31 SURGERY — CONVERSION, PREVIOUS HIP SURGERY, TO TOTAL HIP ARTHROPLASTY
Anesthesia: General | Site: Hip | Laterality: Left

## 2020-10-31 MED ORDER — PROPOFOL 10 MG/ML IV BOLUS
INTRAVENOUS | Status: AC
  Filled 2020-10-31: qty 20

## 2020-10-31 MED ORDER — TRANEXAMIC ACID-NACL 1000-0.7 MG/100ML-% IV SOLN
1000.0000 mg | INTRAVENOUS | Status: AC
  Administered 2020-10-31: 1000 mg via INTRAVENOUS
  Filled 2020-10-31: qty 100

## 2020-10-31 MED ORDER — ONDANSETRON HCL 4 MG PO TABS: 4.0000 mg | ORAL_TABLET | Freq: Four times a day (QID) | ORAL | Status: DC | PRN

## 2020-10-31 MED ORDER — DEXAMETHASONE SODIUM PHOSPHATE 10 MG/ML IJ SOLN: 10.0000 mg | Freq: Once | INTRAMUSCULAR | Status: DC

## 2020-10-31 MED ORDER — GUAIFENESIN ER 600 MG PO TB12: 600.0000 mg | ORAL_TABLET | Freq: Two times a day (BID) | ORAL | Status: DC | PRN

## 2020-10-31 MED ORDER — PROPOFOL 10 MG/ML IV BOLUS
INTRAVENOUS | Status: DC | PRN
  Administered 2020-10-31: 30 mg via INTRAVENOUS
  Administered 2020-10-31: 40 mg via INTRAVENOUS

## 2020-10-31 MED ORDER — POTASSIUM CHLORIDE CRYS ER 10 MEQ PO TBCR: 10.0000 meq | EXTENDED_RELEASE_TABLET | ORAL | Status: DC

## 2020-10-31 MED ORDER — FUROSEMIDE 20 MG PO TABS
20.0000 mg | ORAL_TABLET | ORAL | Status: DC
  Administered 2020-11-01 – 2020-11-04 (×2): 20 mg via ORAL
  Filled 2020-10-31 (×2): qty 1

## 2020-10-31 MED ORDER — LORAZEPAM 0.5 MG PO TABS
0.2500 mg | ORAL_TABLET | Freq: Two times a day (BID) | ORAL | Status: DC | PRN
  Administered 2020-10-31 – 2020-11-04 (×2): 0.25 mg via ORAL
  Filled 2020-10-31 (×2): qty 1

## 2020-10-31 MED ORDER — CEFAZOLIN SODIUM-DEXTROSE 2-4 GM/100ML-% IV SOLN
2.0000 g | Freq: Four times a day (QID) | INTRAVENOUS | Status: AC
  Administered 2020-10-31 (×2): 2 g via INTRAVENOUS
  Filled 2020-10-31 (×2): qty 100

## 2020-10-31 MED ORDER — ALUM & MAG HYDROXIDE-SIMETH 200-200-20 MG/5ML PO SUSP: 15.0000 mL | ORAL | Status: DC | PRN

## 2020-10-31 MED ORDER — LEVOTHYROXINE SODIUM 75 MCG PO TABS
75.0000 ug | ORAL_TABLET | Freq: Every day | ORAL | Status: DC
  Administered 2020-11-01 – 2020-11-04 (×4): 75 ug via ORAL
  Filled 2020-10-31 (×4): qty 1

## 2020-10-31 MED ORDER — TRANEXAMIC ACID-NACL 1000-0.7 MG/100ML-% IV SOLN
1000.0000 mg | Freq: Once | INTRAVENOUS | Status: AC
  Administered 2020-10-31: 1000 mg via INTRAVENOUS
  Filled 2020-10-31: qty 100

## 2020-10-31 MED ORDER — DEXAMETHASONE SODIUM PHOSPHATE 10 MG/ML IJ SOLN
INTRAMUSCULAR | Status: AC
  Filled 2020-10-31: qty 1

## 2020-10-31 MED ORDER — ASPIRIN 81 MG PO CHEW
81.0000 mg | CHEWABLE_TABLET | Freq: Two times a day (BID) | ORAL | Status: DC
  Administered 2020-10-31 – 2020-11-04 (×8): 81 mg via ORAL
  Filled 2020-10-31 (×8): qty 1

## 2020-10-31 MED ORDER — SODIUM FLUORIDE 1.1 % DT GEL: 1.0000 "application " | Freq: Every day | DENTAL | Status: DC

## 2020-10-31 MED ORDER — LACTATED RINGERS IV SOLN: INTRAVENOUS | Status: DC

## 2020-10-31 MED ORDER — MORPHINE SULFATE (PF) 2 MG/ML IV SOLN: 0.5000 mg | INTRAVENOUS | Status: DC | PRN

## 2020-10-31 MED ORDER — CEFAZOLIN SODIUM-DEXTROSE 2-4 GM/100ML-% IV SOLN
2.0000 g | INTRAVENOUS | Status: AC
  Administered 2020-10-31: 2 g via INTRAVENOUS
  Filled 2020-10-31: qty 100

## 2020-10-31 MED ORDER — ONDANSETRON HCL 4 MG/2ML IJ SOLN
INTRAMUSCULAR | Status: DC | PRN
  Administered 2020-10-31: 4 mg via INTRAVENOUS

## 2020-10-31 MED ORDER — METOCLOPRAMIDE HCL 5 MG PO TABS: 5.0000 mg | ORAL_TABLET | Freq: Three times a day (TID) | ORAL | Status: DC | PRN

## 2020-10-31 MED ORDER — METHOCARBAMOL 500 MG IVPB - SIMPLE MED
500.0000 mg | Freq: Four times a day (QID) | INTRAVENOUS | Status: DC | PRN
  Administered 2020-10-31: 500 mg via INTRAVENOUS
  Filled 2020-10-31: qty 50

## 2020-10-31 MED ORDER — DOCUSATE SODIUM 100 MG PO CAPS
100.0000 mg | ORAL_CAPSULE | Freq: Two times a day (BID) | ORAL | Status: DC
  Administered 2020-10-31 – 2020-11-03 (×6): 100 mg via ORAL
  Filled 2020-10-31 (×7): qty 1

## 2020-10-31 MED ORDER — DEXAMETHASONE SODIUM PHOSPHATE 10 MG/ML IJ SOLN
10.0000 mg | Freq: Once | INTRAMUSCULAR | Status: DC
  Filled 2020-10-31: qty 1

## 2020-10-31 MED ORDER — LEVETIRACETAM 500 MG PO TABS
500.0000 mg | ORAL_TABLET | Freq: Every day | ORAL | Status: DC
  Administered 2020-10-31 – 2020-11-03 (×4): 500 mg via ORAL
  Filled 2020-10-31 (×4): qty 1

## 2020-10-31 MED ORDER — PHENYLEPHRINE 40 MCG/ML (10ML) SYRINGE FOR IV PUSH (FOR BLOOD PRESSURE SUPPORT)
PREFILLED_SYRINGE | INTRAVENOUS | Status: AC
  Filled 2020-10-31: qty 10

## 2020-10-31 MED ORDER — PROPOFOL 1000 MG/100ML IV EMUL
INTRAVENOUS | Status: AC
  Filled 2020-10-31: qty 100

## 2020-10-31 MED ORDER — LIDOCAINE HCL (PF) 2 % IJ SOLN
INTRAMUSCULAR | Status: AC
  Filled 2020-10-31: qty 5

## 2020-10-31 MED ORDER — FENTANYL CITRATE (PF) 100 MCG/2ML IJ SOLN
INTRAMUSCULAR | Status: DC | PRN
  Administered 2020-10-31 (×2): 25 ug via INTRAVENOUS
  Administered 2020-10-31: 50 ug via INTRAVENOUS
  Administered 2020-10-31 (×2): 25 ug via INTRAVENOUS
  Administered 2020-10-31: 50 ug via INTRAVENOUS

## 2020-10-31 MED ORDER — FERROUS SULFATE 325 (65 FE) MG PO TABS
325.0000 mg | ORAL_TABLET | Freq: Three times a day (TID) | ORAL | Status: DC
  Administered 2020-11-01 – 2020-11-04 (×10): 325 mg via ORAL
  Filled 2020-10-31 (×10): qty 1

## 2020-10-31 MED ORDER — METHOCARBAMOL 500 MG IVPB - SIMPLE MED
INTRAVENOUS | Status: AC
  Filled 2020-10-31: qty 50

## 2020-10-31 MED ORDER — ONDANSETRON HCL 4 MG/2ML IJ SOLN
INTRAMUSCULAR | Status: AC
  Filled 2020-10-31: qty 2

## 2020-10-31 MED ORDER — ZOLPIDEM TARTRATE 5 MG PO TABS
2.5000 mg | ORAL_TABLET | Freq: Every evening | ORAL | Status: DC | PRN
  Administered 2020-11-01: 2.5 mg via ORAL
  Filled 2020-10-31: qty 1

## 2020-10-31 MED ORDER — SUGAMMADEX SODIUM 200 MG/2ML IV SOLN
INTRAVENOUS | Status: DC | PRN
  Administered 2020-10-31: 100 mg via INTRAVENOUS

## 2020-10-31 MED ORDER — STERILE WATER FOR IRRIGATION IR SOLN
Status: DC | PRN
  Administered 2020-10-31: 2000 mL

## 2020-10-31 MED ORDER — MENTHOL 3 MG MT LOZG: 1.0000 | LOZENGE | OROMUCOSAL | Status: DC | PRN

## 2020-10-31 MED ORDER — ACETAMINOPHEN 325 MG PO TABS
650.0000 mg | ORAL_TABLET | Freq: Once | ORAL | Status: AC
  Administered 2020-10-31: 650 mg via ORAL
  Filled 2020-10-31: qty 2

## 2020-10-31 MED ORDER — METOCLOPRAMIDE HCL 5 MG/ML IJ SOLN: 5.0000 mg | Freq: Three times a day (TID) | INTRAMUSCULAR | Status: DC | PRN

## 2020-10-31 MED ORDER — LIP MEDEX EX OINT
TOPICAL_OINTMENT | CUTANEOUS | Status: AC
  Filled 2020-10-31: qty 7

## 2020-10-31 MED ORDER — 0.9 % SODIUM CHLORIDE (POUR BTL) OPTIME
TOPICAL | Status: DC | PRN
  Administered 2020-10-31: 1000 mL

## 2020-10-31 MED ORDER — ATORVASTATIN CALCIUM 10 MG PO TABS
10.0000 mg | ORAL_TABLET | Freq: Every day | ORAL | Status: DC
  Administered 2020-10-31 – 2020-11-03 (×4): 10 mg via ORAL
  Filled 2020-10-31 (×4): qty 1

## 2020-10-31 MED ORDER — DEXAMETHASONE SODIUM PHOSPHATE 10 MG/ML IJ SOLN
INTRAMUSCULAR | Status: DC | PRN
  Administered 2020-10-31: 8 mg via INTRAVENOUS

## 2020-10-31 MED ORDER — METHOCARBAMOL 500 MG PO TABS
500.0000 mg | ORAL_TABLET | Freq: Four times a day (QID) | ORAL | Status: DC | PRN
  Administered 2020-11-01 – 2020-11-04 (×7): 500 mg via ORAL
  Filled 2020-10-31 (×7): qty 1

## 2020-10-31 MED ORDER — CHLORHEXIDINE GLUCONATE 0.12 % MT SOLN
15.0000 mL | Freq: Once | OROMUCOSAL | Status: AC
  Administered 2020-10-31: 15 mL via OROMUCOSAL

## 2020-10-31 MED ORDER — BISACODYL 10 MG RE SUPP: 10.0000 mg | Freq: Every day | RECTAL | Status: DC | PRN

## 2020-10-31 MED ORDER — AMISULPRIDE (ANTIEMETIC) 5 MG/2ML IV SOLN: 10.0000 mg | Freq: Once | INTRAVENOUS | Status: DC | PRN

## 2020-10-31 MED ORDER — PHENYLEPHRINE 40 MCG/ML (10ML) SYRINGE FOR IV PUSH (FOR BLOOD PRESSURE SUPPORT)
PREFILLED_SYRINGE | INTRAVENOUS | Status: DC | PRN
  Administered 2020-10-31: 40 ug via INTRAVENOUS
  Administered 2020-10-31: 80 ug via INTRAVENOUS
  Administered 2020-10-31: 160 ug via INTRAVENOUS
  Administered 2020-10-31: 80 ug via INTRAVENOUS
  Administered 2020-10-31: 40 ug via INTRAVENOUS
  Administered 2020-10-31 (×2): 80 ug via INTRAVENOUS

## 2020-10-31 MED ORDER — FENTANYL CITRATE (PF) 100 MCG/2ML IJ SOLN
INTRAMUSCULAR | Status: AC
  Filled 2020-10-31: qty 2

## 2020-10-31 MED ORDER — MAGNESIUM CITRATE PO SOLN
1.0000 | Freq: Once | ORAL | Status: AC | PRN
  Administered 2020-11-02: 1 via ORAL
  Filled 2020-10-31: qty 296

## 2020-10-31 MED ORDER — ORAL CARE MOUTH RINSE: 15.0000 mL | Freq: Once | OROMUCOSAL | Status: AC

## 2020-10-31 MED ORDER — POLYETHYLENE GLYCOL 3350 17 G PO PACK
17.0000 g | PACK | Freq: Two times a day (BID) | ORAL | Status: DC
  Administered 2020-10-31 – 2020-11-02 (×4): 17 g via ORAL
  Filled 2020-10-31 (×6): qty 1

## 2020-10-31 MED ORDER — PHENOL 1.4 % MT LIQD: 1.0000 | OROMUCOSAL | Status: DC | PRN

## 2020-10-31 MED ORDER — HYDROCODONE-ACETAMINOPHEN 5-325 MG PO TABS: 1.0000 | ORAL_TABLET | ORAL | Status: DC | PRN

## 2020-10-31 MED ORDER — ROCURONIUM BROMIDE 10 MG/ML (PF) SYRINGE
PREFILLED_SYRINGE | INTRAVENOUS | Status: AC
  Filled 2020-10-31: qty 10

## 2020-10-31 MED ORDER — LIDOCAINE 2% (20 MG/ML) 5 ML SYRINGE
INTRAMUSCULAR | Status: DC | PRN
  Administered 2020-10-31: 40 mg via INTRAVENOUS

## 2020-10-31 MED ORDER — ACETAMINOPHEN 325 MG PO TABS
325.0000 mg | ORAL_TABLET | Freq: Four times a day (QID) | ORAL | Status: DC | PRN
  Administered 2020-11-02 – 2020-11-03 (×3): 650 mg via ORAL
  Filled 2020-10-31 (×3): qty 2

## 2020-10-31 MED ORDER — HYDROCODONE-ACETAMINOPHEN 7.5-325 MG PO TABS
1.0000 | ORAL_TABLET | ORAL | Status: DC | PRN
  Administered 2020-10-31 – 2020-11-01 (×3): 1 via ORAL
  Administered 2020-11-01: 2 via ORAL
  Administered 2020-11-01 – 2020-11-04 (×8): 1 via ORAL
  Filled 2020-10-31 (×12): qty 1

## 2020-10-31 MED ORDER — DIPHENHYDRAMINE HCL 12.5 MG/5ML PO ELIX: 12.5000 mg | ORAL_SOLUTION | ORAL | Status: DC | PRN

## 2020-10-31 MED ORDER — ONDANSETRON HCL 4 MG/2ML IJ SOLN: 4.0000 mg | Freq: Four times a day (QID) | INTRAMUSCULAR | Status: DC | PRN

## 2020-10-31 MED ORDER — SODIUM CHLORIDE 0.9 % IV SOLN: INTRAVENOUS | Status: DC

## 2020-10-31 MED ORDER — ROCURONIUM BROMIDE 10 MG/ML (PF) SYRINGE
PREFILLED_SYRINGE | INTRAVENOUS | Status: DC | PRN
  Administered 2020-10-31: 30 mg via INTRAVENOUS
  Administered 2020-10-31 (×3): 10 mg via INTRAVENOUS

## 2020-10-31 MED ORDER — FENTANYL CITRATE (PF) 100 MCG/2ML IJ SOLN
25.0000 ug | INTRAMUSCULAR | Status: AC | PRN
  Administered 2020-10-31 (×6): 25 ug via INTRAVENOUS

## 2020-10-31 SURGICAL SUPPLY — 49 items
ADH SKN CLS APL DERMABOND .7 (GAUZE/BANDAGES/DRESSINGS) ×1
BAG SPEC THK2 15X12 ZIP CLS (MISCELLANEOUS)
BAG ZIPLOCK 12X15 (MISCELLANEOUS) IMPLANT
BLADE SAW SGTL 11.0X1.19X90.0M (BLADE) IMPLANT
CLOTH BEACON ORANGE TIMEOUT ST (SAFETY) ×3 IMPLANT
COVER PERINEAL POST (MISCELLANEOUS) ×3 IMPLANT
COVER SURGICAL LIGHT HANDLE (MISCELLANEOUS) ×3 IMPLANT
COVER WAND RF STERILE (DRAPES) IMPLANT
CUP ACETBLR 48 OD SECTOR II (Hips) ×2 IMPLANT
DERMABOND ADVANCED (GAUZE/BANDAGES/DRESSINGS) ×2
DERMABOND ADVANCED .7 DNX12 (GAUZE/BANDAGES/DRESSINGS) ×1 IMPLANT
DRAPE STERI IOBAN 125X83 (DRAPES) ×3 IMPLANT
DRAPE U-SHAPE 47X51 STRL (DRAPES) ×6 IMPLANT
DRSG AQUACEL AG ADV 3.5X 6 (GAUZE/BANDAGES/DRESSINGS) ×2 IMPLANT
DRSG AQUACEL AG ADV 3.5X10 (GAUZE/BANDAGES/DRESSINGS) ×5 IMPLANT
DURAPREP 26ML APPLICATOR (WOUND CARE) ×3 IMPLANT
ELECT REM PT RETURN 15FT ADLT (MISCELLANEOUS) ×3 IMPLANT
ELIMINATOR HOLE APEX DEPUY (Hips) ×2 IMPLANT
GLOVE BIO SURGEON STRL SZ 6 (GLOVE) ×6 IMPLANT
GLOVE BIOGEL PI IND STRL 6.5 (GLOVE) ×1 IMPLANT
GLOVE BIOGEL PI IND STRL 7.5 (GLOVE) ×1 IMPLANT
GLOVE BIOGEL PI IND STRL 8.5 (GLOVE) ×1 IMPLANT
GLOVE BIOGEL PI INDICATOR 6.5 (GLOVE) ×2
GLOVE BIOGEL PI INDICATOR 7.5 (GLOVE) ×2
GLOVE BIOGEL PI INDICATOR 8.5 (GLOVE) ×2
GLOVE ECLIPSE 8.0 STRL XLNG CF (GLOVE) ×3 IMPLANT
GLOVE ORTHO TXT STRL SZ7.5 (GLOVE) ×6 IMPLANT
GLOVE SURG ENC MOIS LTX SZ6 (GLOVE) ×4 IMPLANT
GOWN STRL REUS W/TWL LRG LVL3 (GOWN DISPOSABLE) ×6 IMPLANT
GOWN STRL REUS W/TWL XL LVL3 (GOWN DISPOSABLE) ×3 IMPLANT
HEAD FEM STD 32X+1 STRL (Hips) ×2 IMPLANT
HOLDER FOLEY CATH W/STRAP (MISCELLANEOUS) ×3 IMPLANT
KIT TURNOVER KIT A (KITS) IMPLANT
PACK ANTERIOR HIP CUSTOM (KITS) ×3 IMPLANT
PENCIL SMOKE EVACUATOR (MISCELLANEOUS) ×2 IMPLANT
PINN ALTRX NEUT ID X OD 32X48 ×2 IMPLANT
SAW OSC TIP CART 19.5X105X1.3 (SAW) ×3 IMPLANT
SCREW 6.5MMX30MM (Screw) ×2 IMPLANT
STEM FEMORAL SZ 6MM STD ACTIS (Stem) ×2 IMPLANT
SUT MNCRL AB 4-0 PS2 18 (SUTURE) ×7 IMPLANT
SUT VIC AB 1 CT1 36 (SUTURE) ×9 IMPLANT
SUT VIC AB 2-0 CT1 27 (SUTURE) ×6
SUT VIC AB 2-0 CT1 TAPERPNT 27 (SUTURE) ×2 IMPLANT
SUT VLOC 180 0 24IN GS25 (SUTURE) ×3 IMPLANT
SYR BULB IRRIG 60ML STRL (SYRINGE) ×2 IMPLANT
TRAY FOLEY MTR SLVR 14FR STAT (SET/KITS/TRAYS/PACK) ×2 IMPLANT
TRAY FOLEY MTR SLVR 16FR STAT (SET/KITS/TRAYS/PACK) ×1 IMPLANT
TUBE SUCTION HIGH CAP CLEAR NV (SUCTIONS) ×2 IMPLANT
WATER STERILE IRR 1000ML POUR (IV SOLUTION) ×3 IMPLANT

## 2020-10-31 NOTE — Op Note (Signed)
NAME:  Sara Davila                ACCOUNT NO.: [id number]      MEDICAL RECORD NO.: [id number]      FACILITY:  [facility]      PHYSICIAN:  Mauri Pole  DATE OF BIRTH:  03/07/1930     DATE OF PROCEDURE:  10/31/2020                                 OPERATIVE REPORT         PREOPERATIVE DIAGNOSIS: Failed left hip surgery status post open reduction internal fixation of left intertrochanteric femur fracture due to malunion as well as advanced left hip osteoarthritis.     POSTOPERATIVE DIAGNOSIS:  Failed left hip surgery status post open reduction internal fixation of left intertrochanteric femur fracture due to malunion as well as advanced left hip osteoarthritis.     PROCEDURE:   Conversion of failed left hip surgery to left total hip replacement through an anterior approach utilizing DePuy THR system, component size 48 mm pinnacle cup, a size 32+4 neutral   Altrex liner, a size 6 standard Actis stem with a 32+1 Articuleze metal head ball.      SURGEON:  Pietro Cassis. Alvan Dame, M.D.      ASSISTANT:  Griffith Citron, PA-C     ANESTHESIA:  General.      SPECIMENS:  None.      COMPLICATIONS:  None.      BLOOD LOSS:  300 cc     DRAINS:  None.      INDICATION OF THE PROCEDURE:  Sara Davila is a 86 y.o. female who sustained a left intertrochanteric femur fracture in April 2021.  She underwent an open reduction internal fixation of this hip fracture utilizing a trochanteric nail.  As we followed her in the office she was continue to have issues with regards to pain related to advanced left hip osteoarthritis.  We also recognized malunion with compression through her fracture site and prominence of her hardware laterally and proximally.  Given the pain and her desire to remain as functionally active as possible she wished to proceed with conversion of this failed left hip surgery to a total hip replacement.  Consent was obtained for   benefit of pain relief.  Specific risks  of infection, DVT, component   failure, dislocation, neurovascular injury, and need for revision surgery were reviewed in the office. We also spent time discussing her age.  Despite her age of 86 she wished to remain as active as possible and thus wished to proceed with surgery.     PROCEDURE IN DETAIL:  The patient was brought to operative theater.   Once adequate anesthesia, preoperative antibiotics, 2 gm of Ancef, 1 gm of Tranexamic Acid, and 10 mg of Decadron were administered, the patient was positioned supine on the [employer] table.  Once the patient was safely positioned with adequate padding of boney prominences we predraped out the hip, and used fluoroscopy to confirm orientation of the pelvis.      The left hip was then prepped and draped from proximal iliac crest to   mid thigh with a shower curtain technique.      Time-out was performed identifying the patient, planned procedure, and the appropriate extremity.    First portion of the case was directed at removing her femoral nail.  I utilized her old incisions beginning distally with a distal interlock.  This distal interlock screw was removed without issue.  I then moved to the lag screw.  This was very prominent laterally.  Through her old incision it was easily identified.  Once I had the lateral aspect of the screw identified we made the incision proximally.  Here I was able to easily identify the proximal aspect of her previously placed nail as well.  We utilized the appropriate screwdriver to back the locking bolt out.  Once this was done I was able to remove the lag screw.  I then was able to move the nail actually by the use of a rondure.  We then irrigated all of these wounds.  I reapproximated the gluteal fascia proximally using #1 Vicryl in a running fashion.  I then reapproximated the iliotibial band fascia at the area of her lag screw insertion.  All wounds were then closed in layers using 2-0 Vicryl and a running Monocryl  stitch. Once this portion of the case was performed we directed attention to performing her left total hip replacement.   An incision was then made 2 cm lateral to the   anterior superior iliac spine extending over the orientation of the   tensor fascia lata muscle and sharp dissection was carried down to the   fascia of the muscle.      The fascia was then incised.  The muscle belly was identified and swept   laterally and retractor placed along the superior neck.  Following   cauterization of the circumflex vessels and removing some pericapsular   fat, a second cobra retractor was placed on the inferior neck.  A partial capsulectomy was made along the line of the   superior neck to the trochanteric fossa, then extended proximally and   distally.  A tag suture was placed in the superior capsule and the retractors were then placed   intracapsular.  We then identified the trochanteric fossa and   orientation of my neck cut and then made a neck osteotomy with the femur on traction.  The femoral   head was removed without difficulty or complication.  Traction was let   off and retractors were placed posterior and anterior around the   acetabulum.  Here identified advanced left hip osteoarthritis however there was no evidence of any significant nonunion or mall union of the trochanteric segments complicating the the continued procedure.     The labrum and foveal tissue were debrided.  I began reaming with a 44 mm   reamer and reamed up to 47 mm reamer with good bony bed preparation and a 48 mm  cup was chosen.  The final 48 mm Pinnacle cup was then impacted under fluoroscopy to confirm the depth of penetration and orientation with respect to   Abduction and forward flexion.  A screw was placed into the ilium followed by the hole eliminator.  The final   32+4 neutral Altrex liner was impacted with good visualized rim fit.  The cup was positioned anatomically within the acetabular portion of the  pelvis.      At this point, the femur was rolled to 100 degrees.  Further capsule was   released off the inferior aspect of the femoral neck.  I then   released the superior capsule proximally.  With the leg in a neutral position the hook was placed laterally   along the femur under the vastus lateralis origin and elevated manually and  then held in position using the hook attachment on the bed.  The leg was then extended and adducted with the leg rolled to 100   degrees of external rotation.  Retractors were placed along the medial calcar and posteriorly over the greater trochanter.  Once the proximal femur was fully   exposed, I used a box osteotome to set orientation.  I then began   broaching with the starting chili pepper broach and passed this by hand and then broached up to 6.  With the 6 broach in place I chose a standard neck and did several trial reductions.  The offset was appropriate, leg lengths   appeared to be equal best matched with the +1 head ball trial confirmed radiographically.   Given these findings, I went ahead and dislocated the hip, repositioned all   retractors and positioned the right hip in the extended and abducted position.  The final 6 Standard Actis stem was   chosen and it was impacted down to the level of neck cut.  Based on this   and the trial reductions, a final 32+1 Articuleze metal head delta ceramic ball was chosen and   impacted onto a clean and dry trunnion, and the hip was reduced.  The   hip had been irrigated throughout the case again at this point.  I did   reapproximate the superior capsular leaflet to the anterior leaflet   using #1 Vicryl.  The fascia of the   tensor fascia lata muscle was then reapproximated using #1 Vicryl and #0 Stratafix sutures.  The   remaining wound was closed with 2-0 Vicryl and running 4-0 Monocryl.   The hip was cleaned, dried, and dressed sterilely using Dermabond and   Aquacel dressing.  The patient was then brought    to recovery room in stable condition tolerating the procedure well.    Griffith Citron, PA-C was present for the entirety of the case involved from   preoperative positioning, perioperative retractor management, general   facilitation of the case, as well as primary wound closure as assistant.            Pietro Cassis Alvan Dame, M.D.        10/31/2020 12:22 PM

## 2020-10-31 NOTE — Evaluation (Signed)
Physical Therapy Evaluation Patient Details Name: Sara Davila MRN: [id number] DOB: 06-10-1930 Today's Date: 10/31/2020   History of Present Illness  Pt is 86 yo female admitted for conversion of previous L hip ORIF to L anterior THA on 10/31/20.  She has PMH including but not limited to seizure 8/21, CVA 7/21, anemia, neuropathy, GERD, hypothyroidism, and fall w/ L hip fx 4/21.  Clinical Impression  Pt is s/p L anteriorTHA conversion from prior ORIF resulting in the deficits listed below (see PT Problem List). Pt with PWB status on L LE requiring cues.  She was able to transfer and take a few pivot steps toward [facility] with min A.  Pt tends to keep L hip internally rotated but is able to obtain neutral with assist.  Pt is from ALF and normally is independent with rollator for ambulation but has min A with some ADLs.   Pt will benefit from skilled PT to increase their independence and safety with mobility to allow discharge to the venue listed below.      Follow Up Recommendations Follow surgeon's recommendation for DC plan and follow-up therapies;Supervision/Assistance - 24 hour (Pt reports she is from ALF but plans to return to their SNF)    Equipment Recommendations  Rolling walker with 5" wheels (YOUTH RW)    Recommendations for Other Services       Precautions / Restrictions Precautions Precautions: Fall Restrictions Weight Bearing Restrictions: Yes LLE Weight Bearing: Partial weight bearing LLE Partial Weight Bearing Percentage or Pounds: 50%      Mobility  Bed Mobility Overal bed mobility: Needs Assistance Bed Mobility: Supine to Sit;Sit to Supine     Supine to sit: Min assist Sit to supine: Min assist   General bed mobility comments: Min A for L LE    Transfers Overall transfer level: Needs assistance Equipment used: Rolling walker (2 wheeled) Transfers: Sit to/from Stand Sit to Stand: Min assist         General transfer comment: cues for hand placement and  PWB  Ambulation/Gait Ambulation/Gait assistance: Min assist Gait Distance (Feet): 3 Feet Assistive device: Rolling walker (2 wheeled) Gait Pattern/deviations: Step-to pattern;Decreased stride length;Shuffle;Decreased weight shift to left Gait velocity: decreased   General Gait Details: Side steps/pivots toward HOB.  Pt did well maintaining PWB , requruing min A for balance, unable to take step with R LE but did pivot on R LE toward [place]            Wheelchair Mobility    Modified Rankin (Stroke Patients Only)       Balance Overall balance assessment: Needs assistance Sitting-balance support: Bilateral upper extremity supported Sitting balance-Leahy Scale: Poor Sitting balance - Comments: required use of UE   Standing balance support: Bilateral upper extremity supported Standing balance-Leahy Scale: Poor Standing balance comment: Required use of UE and min a                             Pertinent Vitals/Pain Pain Assessment: 0-10 Pain Score: 6  Pain Location: L hip Pain Descriptors / Indicators: Aching;Sore Pain Intervention(s): Limited activity within patient's tolerance;Monitored during session;Repositioned    Home Living Family/patient expects to be discharged to:: Other (Comment) (ALF vs SNF)               Home Equipment: Walker - 4 wheels;Shower seat;Tub bench;Grab bars - toilet;Grab bars - tub/shower;Hand held shower head Additional Comments: Pt at ALF - [facility]  Prior Function Level of Independence: Needs assistance   Gait / Transfers Assistance Needed: In apartment she "furniture surfs", for longer distances uses rollator, reports limps since femur fx earlier this year  ADL's / Homemaking Assistance Needed: Pt independent with dressing and toielting; had min A with showers for safety  Comments: Does not drive     Hand Dominance        Extremity/Trunk Assessment   Upper Extremity Assessment Upper Extremity  Assessment: Overall WFL for tasks assessed    Lower Extremity Assessment Lower Extremity Assessment: LLE deficits/detail;RLE deficits/detail RLE Deficits / Details: WFL LLE Deficits / Details: ROM: knee and ankle WFL, hip tends to keep internally rotated but can get to neutral (encouraged neutral) otherwise WFL; MMT: ankle 5/5, knee 3/5 not further tested, hip 1/5    Cervical / Trunk Assessment Cervical / Trunk Assessment: Normal  Communication   Communication: No difficulties  Cognition Arousal/Alertness: Awake/alert Behavior During Therapy: WFL for tasks assessed/performed Overall Cognitive Status: Within Functional Limits for tasks assessed                                        General Comments General comments (skin integrity, edema, etc.): VSS    Exercises     Assessment/Plan    PT Assessment Patient needs continued PT services  PT Problem List Decreased strength;Decreased mobility;Decreased range of motion;Decreased knowledge of precautions;Decreased activity tolerance;Decreased balance;Decreased knowledge of use of DME;Pain       PT Treatment Interventions DME instruction;Therapeutic activities;Modalities;Gait training;Therapeutic exercise;Patient/family education;Functional mobility training;Balance training    PT Goals (Current goals can be found in the Care Plan section)  Acute Rehab PT Goals Patient Stated Goal: return home, decrease groin pain PT Goal Formulation: With patient Time For Goal Achievement: 11/14/20 Potential to Achieve Goals: Good Additional Goals Additional Goal #1: Pt will adheare to PWB status without cues    Frequency 7X/week   Barriers to discharge        Co-evaluation               AM-PAC PT "6 Clicks" Mobility  Outcome Measure Help needed turning from your back to your side while in a flat bed without using bedrails?: A Little Help needed moving from lying on your back to sitting on the side of a flat bed  without using bedrails?: A Little Help needed moving to and from a bed to a chair (including a wheelchair)?: A Little Help needed standing up from a chair using your arms (e.g., wheelchair or bedside chair)?: A Little Help needed to walk in hospital room?: A Lot Help needed climbing 3-5 steps with a railing? : A Lot 6 Click Score: 16    End of Session Equipment Utilized During Treatment: Gait belt Activity Tolerance: Patient tolerated treatment well Patient left: in bed;with call bell/phone within reach;with bed alarm set Nurse Communication: Mobility status;Precautions;Weight bearing status (white board communication) PT Visit Diagnosis: Other abnormalities of gait and mobility (R26.89);Muscle weakness (generalized) (M62.81)    Time: 1700-1730 PT Time Calculation (min) (ACUTE ONLY): 30 min   Charges:   PT Evaluation $PT Eval Moderate Complexity: 1 Mod PT Treatments $Gait Training: 8-22 mins        Abran Richard, PT Acute Rehab Services Pager [phone] [facility] [place] 10/31/2020, 5:42 PM

## 2020-10-31 NOTE — Anesthesia Postprocedure Evaluation (Signed)
Anesthesia Post Note  Patient: Sara Davila  Procedure(s) Performed: CONVERSION TO ANTERIOR TOTAL HIP (Left Hip)     Patient location during evaluation: Nursing Unit Anesthesia Type: General Level of consciousness: oriented and awake and alert Pain management: pain level controlled Vital Signs Assessment: post-procedure vital signs reviewed and stable Respiratory status: spontaneous breathing and respiratory function stable Cardiovascular status: blood pressure returned to baseline and stable Postop Assessment: no headache, no backache, no apparent nausea or vomiting and patient able to bend at knees Anesthetic complications: no   No complications documented.  Last Vitals:  Vitals:   10/31/20 0737  BP: 139/82  Pulse: 80  Resp: 16  Temp: 36.8 C  SpO2: 93%    Last Pain:  Vitals:   10/31/20 0737  TempSrc: Oral  PainSc: 2                  Barnet Glasgow

## 2020-10-31 NOTE — Transfer of Care (Signed)
Immediate Anesthesia Transfer of Care Note  Patient: Sara Davila  Procedure(s) Performed: CONVERSION TO ANTERIOR TOTAL HIP (Left Hip)  Patient Location: PACU  Anesthesia Type:General  Level of Consciousness: awake, alert  and oriented  Airway & Oxygen Therapy: Patient Spontanous Breathing and Patient connected to face mask oxygen  Post-op Assessment: Report given to RN, Post -op Vital signs reviewed and stable and Patient moving all extremities X 4  Post vital signs: Reviewed and stable  Last Vitals:  Vitals Value Taken Time  BP    Temp    Pulse    Resp    SpO2      Last Pain:  Vitals:   10/31/20 0737  TempSrc: Oral  PainSc: 2       Patients Stated Pain Goal: 4 (92/33/00 7622)  Complications: No complications documented.

## 2020-10-31 NOTE — Anesthesia Procedure Notes (Signed)
Procedure Name: Intubation Date/Time: 10/31/2020 10:02 AM Performed by: Niel Hummer, CRNA Pre-anesthesia Checklist: Patient identified, Emergency Drugs available, Suction available and Patient being monitored Patient Re-evaluated:Patient Re-evaluated prior to induction Oxygen Delivery Method: Circle system utilized Preoxygenation: Pre-oxygenation with 100% oxygen Induction Type: IV induction Ventilation: Mask ventilation without difficulty Laryngoscope Size: Mac and 3 Grade View: Grade I Tube type: Oral Tube size: 6.5 mm Number of attempts: 1 Airway Equipment and Method: Stylet Placement Confirmation: ETT inserted through vocal cords under direct vision,  positive ETCO2 and breath sounds checked- equal and bilateral Secured at: 22 cm Tube secured with: Tape Dental Injury: Teeth and Oropharynx as per pre-operative assessment

## 2020-10-31 NOTE — Interval H&P Note (Signed)
History and Physical Interval Note:  10/31/2020 7:45 AM  Sara Davila  has presented today for surgery, with the diagnosis of Failed left hip surgery.  The various methods of treatment have been discussed with the patient and family. After consideration of risks, benefits and other options for treatment, the patient has consented to  Procedure(s) with comments: CONVERSION TO ANTERIOR TOTAL HIP (Left) - 2 hrs as a surgical intervention.  The patient's history has been reviewed, patient examined, no change in status, stable for surgery.  I have reviewed the patient's chart and labs.  Questions were answered to the patient's satisfaction.     Mauri Pole

## 2020-10-31 NOTE — H&P (Signed)
TOTAL HIP ADMISSION H&P  Patient is admitted for conversion of previous left hip surgery to left total hip arthroplasty   Subjective:  Chief Complaint: Left hip pain, s/p left hip IM nail   HPI: Sara Davila, 86 y.o. female, has a history of pain and functional disability in the left hip(s) due to arthritis and patient has failed non-surgical conservative treatments for greater than 12 weeks to include corticosteriod injections and activity modification. She has a history of left hip IM nail on 03/03/20 following intertrochanteric femur fracture by Dr. Alvan Dame. Post-operative radiographs noted compression through the lag screw, with lateral prominence of the lag screw. She had worsening left hip arthritic pain over the last 8 months, with severe left hip osteoarthritis on radiographs. Dr. Alvan Dame discussed hardware removal with conversion to left total hip arthroplasty, including risks, benefits, and expectations. There is no current active infection.  Patient Active Problem List   Diagnosis Date Noted  . S/P left THA, posterior 10/31/2020  . Seizure ([place]) 07/12/2020  . Left-sided weakness 07/11/2020  . History of TIA (transient ischemic attack) 07/11/2020  . TIA (transient ischemic attack) 06/20/2020  . CVA (cerebral vascular accident) ([place]) 06/19/2020  . Asymptomatic bacteriuria 06/19/2020  . HTN (hypertension), benign 06/19/2020  . Thrombocytosis 06/19/2020  . Left hip pain 04/15/2020  . Left knee pain 03/20/2020  . Slow transit constipation 03/20/2020  . Left foot pain 03/18/2020  . Urinary retention 03/06/2020  . Blood loss anemia 03/06/2020  . Displaced intertrochanteric fracture of left femur, initial encounter for closed fracture ([place]) 03/03/2020  . Prediabetes 01/04/2020  . Memory deficit 01/04/2020  . Osteoporosis 01/04/2020  . History of vasculitis 01/04/2020  . Venous insufficiency 01/04/2020  . Neuropathy 03/30/2018  . Hyponatremia 07/21/2017  . Insomnia secondary to anxiety  07/21/2017  . GERD (gastroesophageal reflux disease) 07/21/2017  . S/P small bowel resection 07/09/2017  . Hypothyroidism 02/26/2017  . Paresthesias 09/06/2016  . Fall 03/29/2012   Past Medical History:  Diagnosis Date  . Abdominal pain 02/26/2017  . Anxiety   . Arthritis    Left Knee, Wrist, Back and Hips  . Cervical vertebral fusion   . Diverticulitis   . Diverticulosis   . Fibromyalgia   . GERD (gastroesophageal reflux disease)   . Gout 02/2018   L hand  . Hypertension     after d/c from hospital no current problems or medication  . Hypothyroidism   . Pelvis fracture ([place])   . Peritoneal free air 03/29/2012  . Pneumonia   . Pneumoperitoneum 02/26/2017  . Pre-diabetes   . Seizure ([place])   . Thyroid disorder   . Toe pain 09/06/2016    Past Surgical History:  Procedure Laterality Date  . ABDOMINAL HYSTERECTOMY    . BOWEL RESECTION N/A 07/09/2017   Procedure: SMALL BOWEL RESECTION;  Surgeon: Johnathan Hausen, MD;  Location: [facility];  Service: General;  Laterality: N/A;  . CERVICAL FUSION     x2  . FEMUR IM NAIL Left 03/03/2020   Procedure: INTRAMEDULLARY (IM) NAIL FEMORAL;  Surgeon: Paralee Cancel, MD;  Location: [facility];  Service: Orthopedics;  Laterality: Left;  . LAPAROSCOPIC APPENDECTOMY N/A 03/04/2017   Procedure: APPENDECTOMY LAPAROSCOPIC;  Surgeon: Johnathan Hausen, MD;  Location: [facility];  Service: General;  Laterality: N/A;  . LAPAROSCOPY N/A 03/04/2017   Procedure: LAPAROSCOPY DIAGNOSTIC, ENTEROLYSIS;  Surgeon: Johnathan Hausen, MD;  Location: [facility];  Service: General;  Laterality: N/A;  . LAPAROTOMY N/A 07/09/2017   Procedure: EXPLORATORY LAPAROTOMY;  Surgeon:  Johnathan Hausen, MD;  Location: [facility];  Service: General;  Laterality: N/A;  . SPINE SURGERY     Lumbar- rod placement  . TONSILLECTOMY     86y/o  . TOTAL VAGINAL HYSTERECTOMY      Current Facility-Administered Medications  Medication Dose Route Frequency Provider Last Rate Last Admin  . acetaminophen (TYLENOL)  tablet 650 mg  650 mg Oral Once Suzette Battiest, MD      . ceFAZolin (ANCEF) IVPB 2g/100 mL premix  2 g Intravenous On Call to [place], Rodman Key, PA-C      . chlorhexidine (PERIDEX) 0.12 % solution 15 mL  15 mL Mouth/Throat Once Effie Berkshire, MD       Or  . MEDLINE mouth rinse  15 mL Mouth Rinse Once Effie Berkshire, MD      . dexamethasone (DECADRON) injection 10 mg  10 mg Intravenous Once Babish, Matthew, PA-C      . lactated ringers infusion   Intravenous Continuous Effie Berkshire, MD      . tranexamic acid (CYKLOKAPRON) IVPB 1,000 mg  1,000 mg Intravenous To OR Danae Orleans, PA-C       Allergies  Allergen Reactions  . Codeine Rash  . Penicillins Rash    Has patient had a PCN reaction causing immediate rash, facial/tongue/throat swelling, SOB or lightheadedness with hypotension: No Has patient had a PCN reaction causing severe rash involving mucus membranes or skin necrosis: No Has patient had a PCN reaction that required hospitalization: No Has patient had a PCN reaction occurring within the last 10 years: No If all of the above answers are "NO", then may proceed with Cephalosporin use.   [place] [Levofloxacin]     Social History   Tobacco Use  . Smoking status: Former Research scientist (life sciences)  . Smokeless tobacco: Never Used  . Tobacco comment: Smoked from age [age]  Substance Use Topics  . Alcohol use: Not Currently    Comment: occasional glass of wine    Family History  Problem Relation Age of Onset  . Asthma Mother   . Pulmonary embolism Mother   . Macular degeneration Mother   . Heart disease Father   . Stroke Father   . Stroke Paternal Uncle   . Breast cancer Maternal Aunt   . Macular degeneration Sister   . Stroke Sister   . Neuropathy Neg Hx      Review of Systems  Constitutional: Negative for chills and fever.  Respiratory: Negative for cough and shortness of breath.   Cardiovascular: Negative for chest pain.  Gastrointestinal: Negative for nausea and  vomiting.  Musculoskeletal: Positive for arthralgias.    Objective:  Physical Exam Patient is an 86 year old female.  Pleasant 86 year old female awake alert and oriented. She is in no acute distress. She walks in the office with assist device. She currently lives in assisted living at [facility].  Left hip exam: Painful passive range of motion left hip anterior and lateral Tenderness laterally over prominent lag screw but no skin breakdown Neurovascular intact distally Trendelenburg gait associated with shortening to this left lower extremity as compared to her right.  Vital signs in last 24 hours:    Labs:   Estimated body mass index is 19.14 kg/m as calculated from the following:   Height as of 10/21/20: 5' (1.524 m).   Weight as of 10/21/20: 44.5 kg.   Imaging Review Plain radiographs demonstrate evidence of advanced left hip osteoarthritis with compression through her fracture site and prominence of the  lag screw laterally. No evidence of distal hardware complications.   Assessment/Plan:  History of left intertroch intramedullary nail, now with end stage osteoarthritis, left hip   The patient history, physical examination, clinical judgement of the provider and imaging studies are consistent with end stage degenerative joint disease of the left hip(s) and total hip arthroplasty is deemed medically necessary. The treatment options including medical management, injection therapy, arthroscopy and arthroplasty were discussed at length. The risks and benefits of total hip arthroplasty were presented and reviewed. The risks due to aseptic loosening, infection, stiffness, dislocation/subluxation,  thromboembolic complications and other imponderables were discussed.  The patient acknowledged the explanation, agreed to proceed with the plan and consent was signed. Patient is being admitted for inpatient treatment for surgery, pain control, PT, OT, prophylactic antibiotics, VTE  prophylaxis, progressive ambulation and ADL's and discharge planning.The patient is planning to be discharged home.  Griffith Citron, PA-C Orthopedic Surgery [facility] [place] [phone]

## 2020-11-01 LAB — CBC
HCT: 26.4 % — ABNORMAL LOW (ref 36.0–46.0)
Hemoglobin: 8.8 g/dL — ABNORMAL LOW (ref 12.0–15.0)
MCH: 32.7 pg (ref 26.0–34.0)
MCHC: 33.3 g/dL (ref 30.0–36.0)
MCV: 98.1 fL (ref 80.0–100.0)
Platelets: 289 10*3/uL (ref 150–400)
RBC: 2.69 MIL/uL — ABNORMAL LOW (ref 3.87–5.11)
RDW: 12.5 % (ref 11.5–15.5)
WBC: 7.6 10*3/uL (ref 4.0–10.5)
nRBC: 0 % (ref 0.0–0.2)

## 2020-11-01 LAB — BASIC METABOLIC PANEL
Anion gap: 7 (ref 5–15)
BUN: 13 mg/dL (ref 8–23)
CO2: 23 mmol/L (ref 22–32)
Calcium: 8.1 mg/dL — ABNORMAL LOW (ref 8.9–10.3)
Chloride: 103 mmol/L (ref 98–111)
Creatinine, Ser: 0.49 mg/dL (ref 0.44–1.00)
GFR, Estimated: >60 mL/min (ref >60)
Glucose, Bld: 132 mg/dL — ABNORMAL HIGH (ref 70–99)
Potassium: 4.1 mmol/L (ref 3.5–5.1)
Sodium: 133 mmol/L — ABNORMAL LOW (ref 135–145)

## 2020-11-01 MED ORDER — HYDROCODONE-ACETAMINOPHEN 5-325 MG PO TABS
1.0000 | ORAL_TABLET | ORAL | 0 refills | Status: DC | PRN
Start: 2020-11-01 — End: 2021-03-03

## 2020-11-01 MED ORDER — FERROUS SULFATE 325 (65 FE) MG PO TABS: 325.0000 mg | ORAL_TABLET | Freq: Three times a day (TID) | ORAL | 0 refills | Status: DC

## 2020-11-01 MED ORDER — ASPIRIN 81 MG PO CHEW: 81.0000 mg | CHEWABLE_TABLET | Freq: Two times a day (BID) | ORAL | 0 refills | Status: AC

## 2020-11-01 MED ORDER — METHOCARBAMOL 500 MG PO TABS: 500.0000 mg | ORAL_TABLET | Freq: Four times a day (QID) | ORAL | 0 refills | Status: DC | PRN

## 2020-11-01 NOTE — TOC Progression Note (Addendum)
Transition of Care [facility]) - Progression Note    Patient Details  Name: Sara Davila MRN: [id number] Date of Birth: 23-[name]-1930  Transition of Care [facility]) CM/SW [place], [place] Phone Number: 11/01/2020, 11:59 AM  Clinical Narrative:    [place] social service staff Katie confirm the facility will accept the patient on Saturday 12/4, pending insurance approval.  Patient will discharge to: [place] [facility] Nurse call report to: [place], Approved 5 days, next review day Dec.7  Details provided to [facility].    Expected Discharge Plan: [place] Barriers to Discharge: Continued Medical Worker up, Covid test.  Expected Discharge Plan and Services Expected Discharge Plan: [place] In-house Referral: Clinical Social Work Discharge Planning Services: CM Consult Post Acute Care Choice: [place] arrangements for the past 2 months: [place] [facility] Expected Discharge Date: 11/01/20                                     Social Determinants of Health (SDOH) Interventions    Readmission Risk Interventions No flowsheet data found.

## 2020-11-01 NOTE — Plan of Care (Signed)
Plan of care reviewed and discussed. °

## 2020-11-01 NOTE — TOC Initial Note (Signed)
Transition of Care [facility]) - Initial/Assessment Note    Patient Details  Name: Sara Davila MRN: [id number] Date of Birth: 01-12-1930  Transition of Care [facility]) CM/SW Contact:    Lia Hopping, [place] Phone Number: 11/01/2020, 11:20 AM  Clinical Narrative: Conversion to anterior total hip surgical intervention.  Re: SNF placement               CSW met with the patient at bedside to discuss SNF rehab. Patient is a resident at [facility] and will discharge to SNF. Patient reports she receives assistance with bathing and medications. Patient is hoping the facility will have a bed for her on the [place].  CSW reached out to social service staff Katie to coordinate d/c to SNF, left a voicemail.   [facility] insurance authorization initiated, pending reference# [id number] Patient will need a covid-19 test 24-48 hours prior to discharge.   Expected Discharge Plan: Skilled Nursing Facility Barriers to Discharge: Continued Medical Work up, Ship broker   Patient Goals and CMS Choice Patient states their goals for this hospitalization and ongoing recovery are:: I hope they [facility]) will have a bed for me   Choice offered to / list presented to : Patient  Expected Discharge Plan and Services Expected Discharge Plan: [place] In-house Referral: Clinical Social Work Discharge Planning Services: CM Consult Post Acute Care Choice: [place] Living arrangements for the past 2 months: [place] Expected Discharge Date: 11/01/20                                    Prior Living Arrangements/Services Living arrangements for the past 2 months: [place] Lives with:: Facility Resident Patient language and need for interpreter reviewed:: No Do you feel safe going back to the place where you live?: Yes      Need for Family Participation in Patient Care: Yes (Comment) Care giver support system in place?: Yes  (comment)   Criminal Activity/Legal Involvement Pertinent to Current Situation/Hospitalization: No - Comment as needed  Activities of Daily Living Home Assistive Devices/Equipment: Eyeglasses, Environmental consultant (specify type) ADL Screening (condition at time of admission) Patient's cognitive ability adequate to safely complete daily activities?: Yes Is the patient deaf or have difficulty hearing?: Yes Does the patient have difficulty seeing, even when wearing glasses/contacts?: No Does the patient have difficulty concentrating, remembering, or making decisions?: No Patient able to express need for assistance with ADLs?: Yes Does the patient have difficulty dressing or bathing?: No Independently performs ADLs?: Yes (appropriate for developmental age) Does the patient have difficulty walking or climbing stairs?: Yes Weakness of Legs: Both Weakness of Arms/Hands: None  Permission Sought/Granted Permission sought to share information with : Facility Art therapist granted to share information with : Yes, Verbal Permission Granted     Permission granted to share info w AGENCY: [facility]        Emotional Assessment Appearance:: Appears stated age Attitude/Demeanor/Rapport: Engaged Affect (typically observed): Accepting Orientation: : Oriented to Self, Oriented to Place, Oriented to  Time, Oriented to Situation Alcohol / Substance Use: Not Applicable Psych Involvement: No (comment)  Admission diagnosis:  S/P total hip arthroplasty [Z96.649] Patient Active Problem List   Diagnosis Date Noted  . S/P left THA, posterior 10/31/2020  . Seizure ([place]) 07/12/2020  . Left-sided weakness 07/11/2020  . History of TIA (transient ischemic attack) 07/11/2020  . TIA (transient  ischemic attack) 06/20/2020  . CVA (cerebral vascular accident) ([place]) 06/19/2020  . Asymptomatic bacteriuria 06/19/2020  . HTN (hypertension), benign 06/19/2020  . Thrombocytosis 06/19/2020  . Left hip  pain 04/15/2020  . Left knee pain 03/20/2020  . Slow transit constipation 03/20/2020  . Left foot pain 03/18/2020  . Urinary retention 03/06/2020  . Blood loss anemia 03/06/2020  . Displaced intertrochanteric fracture of left femur, initial encounter for closed fracture ([place]) 03/03/2020  . Prediabetes 01/04/2020  . Memory deficit 01/04/2020  . Osteoporosis 01/04/2020  . History of vasculitis 01/04/2020  . Venous insufficiency 01/04/2020  . Neuropathy 03/30/2018  . Hyponatremia 07/21/2017  . Insomnia secondary to anxiety 07/21/2017  . GERD (gastroesophageal reflux disease) 07/21/2017  . S/P small bowel resection 07/09/2017  . Hypothyroidism 02/26/2017  . Paresthesias 09/06/2016  . Fall 03/29/2012   PCP:  [name], MD Pharmacy:   [place], [place] [address] Phone: [phone] Fax: [phone]  [facility] [place], [place] [address] [place] [address] Phone: [phone] Fax: [place], [place] [address] [place] [address] Phone: [phone] Fax: [phone]     Social Determinants of Health ([place]) Interventions    Readmission Risk Interventions No flowsheet data found.

## 2020-11-01 NOTE — Progress Notes (Signed)
Patient ID: Sara Davila, female   DOB: [birth year], 86 y.o.   MRN: [id number] Subjective: 1 Day Post-Op Procedure(s) (LRB): CONVERSION TO ANTERIOR TOTAL HIP (Left)    Patient reports pain as mild to moderate, some spasms and cramping reported this am  Objective:   VITALS:   Vitals:   11/01/20 0154 11/01/20 0530  BP: (!) 110/53 (!) 115/59  Pulse: 90 93  Resp: 15 17  Temp: 98.1 F (36.7 C) 98 F (36.7 C)  SpO2: 97% 99%    Neurovascular intact Incision: dressing C/D/I  LABS Recent Labs    11/01/20 0315  HGB 8.8*  HCT 26.4*  WBC 7.6  PLT 289    Recent Labs    11/01/20 0315  NA 133*  K 4.1  BUN 13  CREATININE 0.49  GLUCOSE 132*    No results for input(s): LABPT, INR in the last 72 hours.   Assessment/Plan: 1 Day Post-Op Procedure(s) (LRB): CONVERSION TO ANTERIOR TOTAL HIP (Left)   Advance diet Up with therapy  PWB LLE TOC consult for disposition to skilled care at [facility] labs ABLA - Iron supplement, monitor labs and vitals

## 2020-11-01 NOTE — Progress Notes (Signed)
Physical Therapy Treatment Patient Details Name: Sara Davila MRN: [id number] DOB: 09-11-1930 Today's Date: 11/01/2020    History of Present Illness Pt is 86 yo female admitted for conversion of previous L hip ORIF to L anterior THA on 10/31/20.  She has PMH including but not limited to seizure 8/21, CVA 7/21, anemia, neuropathy, GERD, hypothyroidism, and fall w/ L hip fx 4/21.    PT Comments    Pt making good progress for POD #1 , particularly considering age and PWB status.  She only ambulates short distances due to difficulty with PWB with fatigue.  Pt also tends to internally rotate L hip but can actively correct on her own with cues during exercises and transfers. She had good participation with exercises with good muscle activation and fair pain control.  Continue plan of care.      Follow Up Recommendations  Follow surgeon's recommendation for DC plan and follow-up therapies;Supervision/Assistance - 24 hour (Pt reports plan to return to SNF side of her ALF)     Equipment Recommendations  Other (comment) (Youth RW)    Recommendations for Other Services       Precautions / Restrictions Precautions Precautions: Fall Restrictions LLE Weight Bearing: Partial weight bearing LLE Partial Weight Bearing Percentage or Pounds: 50%    Mobility  Bed Mobility Overal bed mobility: Needs Assistance Bed Mobility: Supine to Sit     Supine to sit: Min assist     General bed mobility comments: Min A for L LE and to scoot to EOB; increased time  Transfers Overall transfer level: Needs assistance Equipment used: Rolling walker (2 wheeled) Transfers: Sit to/from Stand Sit to Stand: Min assist         General transfer comment: cues for hand placement and PWB and neutral positioning of L hip  Ambulation/Gait Ambulation/Gait assistance: Min assist Gait Distance (Feet): 4 Feet (4'x2) Assistive device: Rolling walker (2 wheeled) Gait Pattern/deviations: Step-to pattern;Decreased  stride length;Shuffle;Decreased weight shift to left Gait velocity: decreased   General Gait Details: Smalll steps in room with cues and assist for PWB. Also, required cues for sequencing and RW use. Difficulty maintaining PWB on L LE as she fatigued - limited distance due to this   Chief Strategy Officer    Modified Rankin (Stroke Patients Only)       Balance Overall balance assessment: Needs assistance Sitting-balance support: No upper extremity supported Sitting balance-Leahy Scale: Fair     Standing balance support: Bilateral upper extremity supported;No upper extremity supported Standing balance-Leahy Scale: Fair Standing balance comment: RW for transfers but able to stand without UE support for short time                            Cognition Arousal/Alertness: Awake/alert Behavior During Therapy: WFL for tasks assessed/performed Overall Cognitive Status: Within Functional Limits for tasks assessed                                        Exercises Total Joint Exercises Ankle Circles/Pumps: AROM;Both;Supine (10x2) Quad Sets: AROM;Both;Supine (10x2) Heel Slides: AAROM;Left;AROM;Right;Supine (10x2, cues and assist for neutral rotation L hip) Hip ABduction/ADduction: AAROM;Left;Supine (10x2) Long Arc Quad: AAROM;Left;AROM;Right;10 reps;Seated (assist for neutral rotation L) Other Exercises Other Exercises: Pt tends to internally rotate L hip.  Worked on moving from  internal rotation to neutral rotation at EOB 10x2 AROM    General Comments General comments (skin integrity, edema, etc.): VSS; Educated on PWB status and trying to maintain neutral rotation of L hip as able      Pertinent Vitals/Pain Pain Assessment: 0-10 Pain Score: 4  Pain Location: L hip Pain Descriptors / Indicators: Aching;Sore;Spasm Pain Intervention(s): Limited activity within patient's tolerance;Monitored during session;Premedicated before  session;Repositioned;Ice applied    Home Living                      Prior Function            PT Goals (current goals can now be found in the care plan section) Acute Rehab PT Goals Patient Stated Goal: return home, decrease groin pain PT Goal Formulation: With patient Time For Goal Achievement: 11/14/20 Potential to Achieve Goals: Good Additional Goals Additional Goal #1: Pt will adheare to PWB status without cues Progress towards PT goals: Progressing toward goals    Frequency    7X/week      PT Plan Current plan remains appropriate    Co-evaluation              AM-PAC PT "6 Clicks" Mobility   Outcome Measure  Help needed turning from your back to your side while in a flat bed without using bedrails?: A Little Help needed moving from lying on your back to sitting on the side of a flat bed without using bedrails?: A Little Help needed moving to and from a bed to a chair (including a wheelchair)?: A Little Help needed standing up from a chair using your arms (e.g., wheelchair or bedside chair)?: A Little Help needed to walk in hospital room?: A Little Help needed climbing 3-5 steps with a railing? : A Lot 6 Click Score: 17    End of Session Equipment Utilized During Treatment: Gait belt Activity Tolerance: Patient tolerated treatment well Patient left: with call bell/phone within reach;in chair;with chair alarm set (pillow to elevate and position L hip in neutral rotation) Nurse Communication: Mobility status;Precautions;Weight bearing status PT Visit Diagnosis: Other abnormalities of gait and mobility (R26.89);Muscle weakness (generalized) (M62.81)     Time: 0034-9179 PT Time Calculation (min) (ACUTE ONLY): 35 min  Charges:  $Gait Training: 8-22 mins $Therapeutic Exercise: 8-22 mins                     Abran Richard, PT Acute Rehab Services Pager [phone] [facility] 11/01/2020, 11:38 AM

## 2020-11-02 DIAGNOSIS — R54 Age-related physical debility: Secondary | ICD-10-CM | POA: Diagnosis present

## 2020-11-02 DIAGNOSIS — R7303 Prediabetes: Secondary | ICD-10-CM | POA: Diagnosis present

## 2020-11-02 DIAGNOSIS — M797 Fibromyalgia: Secondary | ICD-10-CM | POA: Diagnosis present

## 2020-11-02 DIAGNOSIS — Z885 Allergy status to narcotic agent status: Secondary | ICD-10-CM | POA: Diagnosis not present

## 2020-11-02 DIAGNOSIS — Z8673 Personal history of transient ischemic attack (TIA), and cerebral infarction without residual deficits: Secondary | ICD-10-CM | POA: Diagnosis not present

## 2020-11-02 DIAGNOSIS — Z8249 Family history of ischemic heart disease and other diseases of the circulatory system: Secondary | ICD-10-CM | POA: Diagnosis not present

## 2020-11-02 DIAGNOSIS — Z87891 Personal history of nicotine dependence: Secondary | ICD-10-CM | POA: Diagnosis not present

## 2020-11-02 DIAGNOSIS — M810 Age-related osteoporosis without current pathological fracture: Secondary | ICD-10-CM | POA: Diagnosis present

## 2020-11-02 DIAGNOSIS — Z981 Arthrodesis status: Secondary | ICD-10-CM | POA: Diagnosis not present

## 2020-11-02 DIAGNOSIS — T8489XA Other specified complication of internal orthopedic prosthetic devices, implants and grafts, initial encounter: Secondary | ICD-10-CM | POA: Diagnosis present

## 2020-11-02 DIAGNOSIS — Z880 Allergy status to penicillin: Secondary | ICD-10-CM | POA: Diagnosis not present

## 2020-11-02 DIAGNOSIS — E039 Hypothyroidism, unspecified: Secondary | ICD-10-CM | POA: Diagnosis present

## 2020-11-02 DIAGNOSIS — M109 Gout, unspecified: Secondary | ICD-10-CM | POA: Diagnosis present

## 2020-11-02 DIAGNOSIS — S72142P Displaced intertrochanteric fracture of left femur, subsequent encounter for closed fracture with malunion: Secondary | ICD-10-CM | POA: Diagnosis not present

## 2020-11-02 DIAGNOSIS — M1612 Unilateral primary osteoarthritis, left hip: Secondary | ICD-10-CM | POA: Diagnosis present

## 2020-11-02 DIAGNOSIS — Z825 Family history of asthma and other chronic lower respiratory diseases: Secondary | ICD-10-CM | POA: Diagnosis not present

## 2020-11-02 DIAGNOSIS — Z20822 Contact with and (suspected) exposure to covid-19: Secondary | ICD-10-CM | POA: Diagnosis present

## 2020-11-02 DIAGNOSIS — K219 Gastro-esophageal reflux disease without esophagitis: Secondary | ICD-10-CM | POA: Diagnosis present

## 2020-11-02 DIAGNOSIS — Z803 Family history of malignant neoplasm of breast: Secondary | ICD-10-CM | POA: Diagnosis not present

## 2020-11-02 DIAGNOSIS — D62 Acute posthemorrhagic anemia: Secondary | ICD-10-CM | POA: Diagnosis not present

## 2020-11-02 DIAGNOSIS — Z823 Family history of stroke: Secondary | ICD-10-CM | POA: Diagnosis not present

## 2020-11-02 DIAGNOSIS — I10 Essential (primary) hypertension: Secondary | ICD-10-CM | POA: Diagnosis present

## 2020-11-02 DIAGNOSIS — F419 Anxiety disorder, unspecified: Secondary | ICD-10-CM | POA: Diagnosis present

## 2020-11-02 LAB — BASIC METABOLIC PANEL
Anion gap: 8 (ref 5–15)
BUN: 16 mg/dL (ref 8–23)
CO2: 23 mmol/L (ref 22–32)
Calcium: 8 mg/dL — ABNORMAL LOW (ref 8.9–10.3)
Chloride: 99 mmol/L (ref 98–111)
Creatinine, Ser: 0.42 mg/dL — ABNORMAL LOW (ref 0.44–1.00)
GFR, Estimated: >60 mL/min (ref >60)
Glucose, Bld: 105 mg/dL — ABNORMAL HIGH (ref 70–99)
Potassium: 4.1 mmol/L (ref 3.5–5.1)
Sodium: 130 mmol/L — ABNORMAL LOW (ref 135–145)

## 2020-11-02 LAB — CBC
HCT: 26 % — ABNORMAL LOW (ref 36.0–46.0)
Hemoglobin: 8.6 g/dL — ABNORMAL LOW (ref 12.0–15.0)
MCH: 33 pg (ref 26.0–34.0)
MCHC: 33.1 g/dL (ref 30.0–36.0)
MCV: 99.6 fL (ref 80.0–100.0)
Platelets: 266 10*3/uL (ref 150–400)
RBC: 2.61 MIL/uL — ABNORMAL LOW (ref 3.87–5.11)
RDW: 12.9 % (ref 11.5–15.5)
WBC: 9.3 10*3/uL (ref 4.0–10.5)
nRBC: 0 % (ref 0.0–0.2)

## 2020-11-02 NOTE — Progress Notes (Signed)
On call provider paged concerning pt bladder scan of 572 after voiding. Pt bladder is distended. Rn awaiting call back.

## 2020-11-02 NOTE — Progress Notes (Signed)
Patient ID: Sara Davila, female   DOB: 04-17-1930, 86 y.o.   MRN: [id number] Subjective: 2 Days Post-Op Procedure(s) (LRB): CONVERSION TO ANTERIOR TOTAL HIP (Left)    Patient reports pain as moderate.  Still weak and needs assistance with room mobility  Objective:   VITALS:   Vitals:   11/01/20 2259 11/02/20 0650  BP: (!) 105/55 124/63  Pulse: 87 94  Resp: 20 20  Temp: 98.8 F (37.1 C) 99.2 F (37.3 C)  SpO2: 96% (!) 88%    Neurovascular intact Incision: dressing C/D/I  LABS Recent Labs    11/01/20 0315 11/02/20 0351  HGB 8.8* 8.6*  HCT 26.4* 26.0*  WBC 7.6 9.3  PLT 289 266    Recent Labs    11/01/20 0315 11/02/20 0351  NA 133* 130*  K 4.1 4.1  BUN 13 16  CREATININE 0.49 0.42*  GLUCOSE 132* 105*    No results for input(s): LABPT, INR in the last 72 hours.   Assessment/Plan: 2 Days Post-Op Procedure(s) (LRB): CONVERSION TO ANTERIOR TOTAL HIP (Left)   Up with therapy   Plan will be to  Work with in house acute PT through weekend to assure safety prior to transfer to skilled unit at [facility] Will hold required COVID screen test until tomorrow for planned transfer Monday PWB LLE

## 2020-11-02 NOTE — Progress Notes (Signed)
Physical Therapy Treatment Patient Details Name: Sara Davila MRN: [id number] DOB: 02-15-1930 Today's Date: 11/02/2020    History of Present Illness Pt is 86 yo female admitted for conversion of previous L hip ORIF to L anterior THA on 10/31/20.  She has PMH including but not limited to seizure 8/21, CVA 7/21, anemia, neuropathy, GERD, hypothyroidism, and fall w/ L hip fx 4/21.    PT Comments    Progressing with mobility. Pt c/o L LE>L hip pain on today. She fatigues fairly easily with activity. Continue to recommend SNF.    Follow Up Recommendations  Follow surgeon's recommendation for DC plan and follow-up therapies (plan is for SNF)     Equipment Recommendations  Rolling walker with 5" wheels (youth height)    Recommendations for Other Services       Precautions / Restrictions Precautions Precautions: Fall Restrictions Weight Bearing Restrictions: Yes LLE Weight Bearing: Partial weight bearing LLE Partial Weight Bearing Percentage or Pounds: 50%    Mobility  Bed Mobility Overal bed mobility: Needs Assistance Bed Mobility: Sit to Supine       Sit to supine: Mod assist   General bed mobility comments: Assist for bil LEs. Increased time.  Transfers Overall transfer level: Needs assistance Equipment used: Rolling walker (2 wheeled) Transfers: Sit to/from Stand Sit to Stand: Min assist         General transfer comment: VCs safety, technique, hand/LE placement. Cues for safety, technique, adherence to PWB. Noted L LE tends to fall into internal rotation  Ambulation/Gait Ambulation/Gait assistance: Min assist Gait Distance (Feet): 15 Feet Assistive device: Rolling walker (2 wheeled) Gait Pattern/deviations: Step-to pattern;Trunk flexed;Antalgic     General Gait Details: Assist to stabilize pt throughout distance. Slow gait speed. Cues for safety, technique, sequence, adherence to PWB status. Ambulation distance limited by pain, fatigue. Followed with recliner    Stairs             Wheelchair Mobility    Modified Rankin (Stroke Patients Only)       Balance Overall balance assessment: Needs assistance         Standing balance support: Bilateral upper extremity supported Standing balance-Leahy Scale: Poor                              Cognition Arousal/Alertness: Awake/alert Behavior During Therapy: WFL for tasks assessed/performed Overall Cognitive Status: Within Functional Limits for tasks assessed                                        Exercises Total Joint Exercises Ankle Circles/Pumps: AROM;Both;10 reps Quad Sets: AROM;Both;10 reps Heel Slides: AAROM;Left;10 reps Hip ABduction/ADduction: AAROM;Left;10 reps    General Comments        Pertinent Vitals/Pain Pain Assessment: 0-10 Pain Score: 6  Pain Location: L knee > L hip Pain Descriptors / Indicators: Aching;Discomfort;Sore Pain Intervention(s): Limited activity within patient's tolerance;Monitored during session;Repositioned    Home Living                      Prior Function            PT Goals (current goals can now be found in the care plan section) Progress towards PT goals: Progressing toward goals    Frequency    7X/week      PT Plan Current plan remains  appropriate    Co-evaluation              AM-PAC PT "6 Clicks" Mobility   Outcome Measure  Help needed turning from your back to your side while in a flat bed without using bedrails?: A Little Help needed moving from lying on your back to sitting on the side of a flat bed without using bedrails?: A Little Help needed moving to and from a bed to a chair (including a wheelchair)?: A Little Help needed standing up from a chair using your arms (e.g., wheelchair or bedside chair)?: A Little Help needed to walk in hospital room?: A Little Help needed climbing 3-5 steps with a railing? : A Lot 6 Click Score: 17    End of Session Equipment  Utilized During Treatment: Gait belt Activity Tolerance: Patient limited by fatigue;Patient limited by pain Patient left: in bed;with call bell/phone within reach   PT Visit Diagnosis: Other abnormalities of gait and mobility (R26.89);Muscle weakness (generalized) (M62.81);Pain Pain - Right/Left: Left Pain - part of body: Knee;Hip     Time: 0100-7121 PT Time Calculation (min) (ACUTE ONLY): 15 min  Charges:  $Gait Training: 8-22 mins                         Doreatha Massed, PT Acute Rehabilitation  Office: [phone] Pager: [phone]

## 2020-11-02 NOTE — Plan of Care (Signed)
  Problem: Pain Management: Goal: Pain level will decrease with appropriate interventions Outcome: Progressing   Problem: Skin Integrity: Goal: Will show signs of wound healing Outcome: Progressing   Problem: Clinical Measurements: Goal: Ability to maintain clinical measurements within normal limits will improve Outcome: Progressing   Problem: Activity: Goal: Risk for activity intolerance will decrease Outcome: Progressing   Problem: Coping: Goal: Level of anxiety will decrease Outcome: Progressing

## 2020-11-02 NOTE — Plan of Care (Signed)
  Problem: Education: Goal: Knowledge of the prescribed therapeutic regimen will improve Outcome: Progressing   Problem: Activity: Goal: Ability to avoid complications of mobility impairment will improve Outcome: Progressing Goal: Ability to tolerate increased activity will improve Outcome: Progressing   Problem: Pain Management: Goal: Pain level will decrease with appropriate interventions Outcome: Progressing   

## 2020-11-03 LAB — SARS CORONAVIRUS 2 BY RT PCR (HOSPITAL ORDER, PERFORMED IN ~~LOC~~ HOSPITAL LAB): SARS Coronavirus 2: NEGATIVE

## 2020-11-03 NOTE — Plan of Care (Signed)
  Problem: Pain Management: Goal: Pain level will decrease with appropriate interventions Outcome: Progressing   Problem: Skin Integrity: Goal: Will show signs of wound healing Outcome: Progressing   Problem: Activity: Goal: Ability to avoid complications of mobility impairment will improve Outcome: Progressing   Problem: Clinical Measurements: Goal: Ability to maintain clinical measurements within normal limits will improve Outcome: Progressing   Problem: Clinical Measurements: Goal: Will remain free from infection Outcome: Progressing   Problem: Coping: Goal: Level of anxiety will decrease Outcome: Progressing   Problem: Skin Integrity: Goal: Risk for impaired skin integrity will decrease Outcome: Progressing   Problem: Safety: Goal: Ability to remain free from injury will improve Outcome: Progressing   Problem: Pain Managment: Goal: General experience of comfort will improve Outcome: Progressing

## 2020-11-03 NOTE — Progress Notes (Signed)
   Subjective: 3 Days Post-Op Procedure(s) (LRB): CONVERSION TO ANTERIOR TOTAL HIP (Left) Patient reports pain as mild.   Patient seen in rounds for Dr. Alvan Dame. Patient is well, and has had no acute complaints or problems other than discomfort in the left hip. No acute events overnight. Resting in the recliner this morning on exam. Voiding without difficulty, but feels she needs to be drinking more water. Reports positive BM. Planning for D/C to SNF tomorrow.  We will continue therapy today.   Objective: Vital signs in last 24 hours: Temp:  [98 F (36.7 C)-98.1 F (36.7 C)] 98 F (36.7 C) (12/05 0606) Pulse Rate:  [85-92] 87 (12/05 0606) Resp:  [16-18] 16 (12/05 0606) BP: (98-106)/(52-58) 106/58 (12/05 0606) SpO2:  [93 %-95 %] 93 % (12/05 0606)  Intake/Output from previous day:  Intake/Output Summary (Last 24 hours) at 11/03/2020 0924 Last data filed at 11/03/2020 0606 Gross per 24 hour  Intake 1440 ml  Output 2025 ml  Net -585 ml     Intake/Output this shift: No intake/output data recorded.  Labs: Recent Labs    11/01/20 0315 11/02/20 0351  HGB 8.8* 8.6*   Recent Labs    11/01/20 0315 11/02/20 0351  WBC 7.6 9.3  RBC 2.69* 2.61*  HCT 26.4* 26.0*  PLT 289 266   Recent Labs    11/01/20 0315 11/02/20 0351  NA 133* 130*  K 4.1 4.1  CL 103 99  CO2 23 23  BUN 13 16  CREATININE 0.49 0.42*  GLUCOSE 132* 105*  CALCIUM 8.1* 8.0*   No results for input(s): LABPT, INR in the last 72 hours.  Exam: General - Patient is Alert and Oriented Extremity - Neurologically intact Sensation intact distally Intact pulses distally Dorsiflexion/Plantar flexion intact Dressing - dressing C/D/I Motor Function - intact, moving foot and toes well on exam.   Past Medical History:  Diagnosis Date  . Abdominal pain 02/26/2017  . Anxiety   . Arthritis    Left Knee, Wrist, Back and Hips  . Cervical vertebral fusion   . Diverticulitis   . Diverticulosis   . Fibromyalgia   .  GERD (gastroesophageal reflux disease)   . Gout 02/2018   L hand  . Hypertension     after d/c from hospital no current problems or medication  . Hypothyroidism   . Pelvis fracture ([place])   . Peritoneal free air 03/29/2012  . Pneumonia   . Pneumoperitoneum 02/26/2017  . Pre-diabetes   . Seizure ([place])   . Thyroid disorder   . Toe pain 09/06/2016    Assessment/Plan: 3 Days Post-Op Procedure(s) (LRB): CONVERSION TO ANTERIOR TOTAL HIP (Left) Active Problems:   S/P left THA, AA  Estimated body mass index is 19.14 kg/m as calculated from the following:   Height as of this encounter: 5' (1.524 m).   Weight as of this encounter: 44.5 kg. Advance diet Up with therapy  DVT Prophylaxis - Aspirin PWB LLE  Plan is to go Skilled nursing facility after hospital stay. COVID test obtained today for planned d/c to SNF tomorrow. Continue working with therapy.   Griffith Citron, PA-C Orthopedic Surgery [phone] 11/03/2020, 9:24 AM

## 2020-11-03 NOTE — Plan of Care (Signed)
  Problem: Education: Goal: Knowledge of the prescribed therapeutic regimen will improve Outcome: Progressing Goal: Understanding of discharge needs will improve Outcome: Progressing   Problem: Activity: Goal: Ability to avoid complications of mobility impairment will improve Outcome: Progressing Goal: Ability to tolerate increased activity will improve Outcome: Progressing   Problem: Pain Management: Goal: Pain level will decrease with appropriate interventions Outcome: Progressing   

## 2020-11-03 NOTE — Progress Notes (Signed)
Physical Therapy Treatment Patient Details Name: Sara Davila MRN: [id number] DOB: [birth year] Today's Date: 11/03/2020    History of Present Illness Pt is 86 yo female admitted for conversion of previous L hip ORIF to L anterior THA on 10/31/20.  She has PMH including but not limited to seizure 8/21, CVA 7/21, anemia, neuropathy, GERD, hypothyroidism, and fall w/ L hip fx 4/21.    PT Comments    Pt continues to participate well. Moderate pain with activity. Plan is for SNF on tomorrow.    Follow Up Recommendations  Follow surgeon's recommendation for DC plan and follow-up therapies (plan is for SNF)     Equipment Recommendations  Rolling walker with 5" wheels (youth height)    Recommendations for Other Services       Precautions / Restrictions Precautions Precautions: Fall Restrictions Weight Bearing Restrictions: Yes LLE Weight Bearing: Partial weight bearing LLE Partial Weight Bearing Percentage or Pounds: 50%    Mobility  Bed Mobility               General bed mobility comments: oob in recliner  Transfers Overall transfer level: Needs assistance Equipment used: Rolling walker (2 wheeled) Transfers: Sit to/from Stand Sit to Stand: Min assist         General transfer comment: VCs safety, technique, hand/LE placement. Cues for safety, technique, adherence to PWB. Noted L LE tends to fall into internal rotation  Ambulation/Gait Ambulation/Gait assistance: Min assist Gait Distance (Feet): 40 Feet (40'x1, 15'x1) Assistive device: Rolling walker (2 wheeled) Gait Pattern/deviations: Step-to pattern;Trunk flexed;Antalgic     General Gait Details: Assist to stabilize pt throughout distance. Slow gait speed. Cues for safety, technique, sequence, adherence to PWB status.   Stairs             Wheelchair Mobility    Modified Rankin (Stroke Patients Only)       Balance Overall balance assessment: Needs assistance         Standing balance  support: Bilateral upper extremity supported Standing balance-Leahy Scale: Poor                              Cognition Arousal/Alertness: Awake/alert Behavior During Therapy: WFL for tasks assessed/performed Overall Cognitive Status: Within Functional Limits for tasks assessed                                        Exercises Total Joint Exercises Ankle Circles/Pumps: AROM;Both;10 reps Heel Slides: AAROM;Left;10 reps Hip ABduction/ADduction: AAROM;Left;10 reps Long Arc Quad: AROM;Left;5 reps;Seated    General Comments        Pertinent Vitals/Pain Pain Assessment: 0-10 Pain Score: 6  Pain Location: L knee > L hip Pain Descriptors / Indicators: Aching;Discomfort;Sore Pain Intervention(s): Limited activity within patient's tolerance;Monitored during session;Repositioned    Home Living                      Prior Function            PT Goals (current goals can now be found in the care plan section) Progress towards PT goals: Progressing toward goals    Frequency    7X/week      PT Plan Current plan remains appropriate    Co-evaluation              AM-PAC PT "6 Clicks" Mobility  Outcome Measure  Help needed turning from your back to your side while in a flat bed without using bedrails?: A Little Help needed moving from lying on your back to sitting on the side of a flat bed without using bedrails?: A Little Help needed moving to and from a bed to a chair (including a wheelchair)?: A Little Help needed standing up from a chair using your arms (e.g., wheelchair or bedside chair)?: A Little Help needed to walk in hospital room?: A Little Help needed climbing 3-5 steps with a railing? : A Lot 6 Click Score: 17    End of Session Equipment Utilized During Treatment: Gait belt Activity Tolerance: Patient limited by fatigue;Patient limited by pain Patient left: in bed;with call bell/phone within reach;with nursing/sitter in  room (NT in room helping pt with ADLs)   PT Visit Diagnosis: Other abnormalities of gait and mobility (R26.89);Muscle weakness (generalized) (M62.81);Pain Pain - Right/Left: Left Pain - part of body: Knee;Hip     Time: 0312-8118 PT Time Calculation (min) (ACUTE ONLY): 20 min  Charges:  $Gait Training: 8-22 mins                        Doreatha Massed, PT Acute Rehabilitation  Office: [phone] Pager: [phone]

## 2020-11-04 ENCOUNTER — Other Ambulatory Visit: Payer: Self-pay | Admitting: *Deleted

## 2020-11-04 ENCOUNTER — Encounter (HOSPITAL_COMMUNITY): Payer: Self-pay | Admitting: Orthopedic Surgery

## 2020-11-04 NOTE — TOC Transition Note (Addendum)
Transition of Care [facility]) - CM/SW Discharge Note   Patient Details  Name: Sara Davila MRN: [id number] Date of Birth: 1930/08/09  [facility] CM/SW Contact:  Lia Hopping, LCSW Phone Number: 11/04/2020, 10:46 AM   Clinical Narrative:    CSW confirm with social service staff Katie,facility ready to accept pt. Today. covid test negative   Patient will discharge to: [facility] Nurse call report to: [phone]  Final next level of care: Skilled Nursing Facility Barriers to Discharge: Barriers Resolved   Patient Goals and CMS Choice Patient states their goals for this hospitalization and ongoing recovery are:: I hope they [facility]) will have a bed for me   Choice offered to / list presented to : Patient  Discharge Placement   Existing PASRR number confirmed : 11/04/20          Patient chooses bed at: [place] Patient to be transferred to facility by: Friend Name of family member notified: Pt. to notify her son Patient and family notified of of transfer: 11/04/20  Discharge Plan and Services In-house Referral: Clinical Social Work Discharge Planning Services: [employer] Consult Post Acute Care Choice: [place]                               Social Determinants of Health (SDOH) Interventions     Readmission Risk Interventions No flowsheet data found.

## 2020-11-04 NOTE — Plan of Care (Signed)
  Problem: Education: Goal: Knowledge of the prescribed therapeutic regimen will improve Outcome: Progressing Goal: Understanding of discharge needs will improve Outcome: Progressing   Problem: Pain Management: Goal: Pain level will decrease with appropriate interventions Outcome: Progressing   

## 2020-11-04 NOTE — Telephone Encounter (Signed)
Received refill Request from [facility] and sent to Dr. Lyndel Safe for approval.

## 2020-11-04 NOTE — Progress Notes (Signed)
Physical Therapy Treatment Patient Details Name: Sara Davila MRN: [id number] DOB: 1930-04-21 Today's Date: 11/04/2020    History of Present Illness Pt is 86 yo female admitted for conversion of previous L hip ORIF to L anterior THA on 10/31/20.  She has PMH including but not limited to seizure 8/21, CVA 7/21, anemia, neuropathy, GERD, hypothyroidism, and fall w/ L hip fx 4/21.    PT Comments    POD # 4 am session Assisted OOB.  General bed mobility comments: increased time with instruction on how to use belt to self asssit L LE off bed.  General transfer comment: increased time and effort to rise from bed with 25% VC's on proper hand placement and safety with turns.  Unsteady.General Gait Details: 50% VC's on proper sequencing and proper walker to self distance to ensure PWB.  Slow.  Unsteady.  Limited distance due to pain.  Assisted back to bed a performed a few TE's.  Positioned to comfort.  Reported to RN pt's pain level 8/10.   Pt plans to D/C back to [facility] but to SNF level for ST Rehab.    Follow Up Recommendations  Follow surgeon's recommendation for DC plan and follow-up therapies     Equipment Recommendations  Rolling walker with 5" wheels (youth)    Recommendations for Other Services       Precautions / Restrictions Precautions Precautions: Fall Restrictions Weight Bearing Restrictions: Yes LLE Weight Bearing: Partial weight bearing LLE Partial Weight Bearing Percentage or Pounds: 50%    Mobility  Bed Mobility Overal bed mobility: Needs Assistance Bed Mobility: Supine to Sit     Supine to sit: Min assist;Min guard     General bed mobility comments: increased time with instruction on how to use belt to self asssit L LE off bed  Transfers Overall transfer level: Needs assistance Equipment used: Rolling walker (2 wheeled) Transfers: Sit to/from [facility] Sit to Stand: Min assist Stand pivot transfers: Min assist       General  transfer comment: increased time and effort to rise from bed with 25% VC's on proper hand placement and safety with turns.  Unsteady.  Ambulation/Gait Ambulation/Gait assistance: Min guard Gait Distance (Feet): 38 Feet Assistive device: Rolling walker (2 wheeled) Gait Pattern/deviations: Step-to pattern;Trunk flexed;Antalgic Gait velocity: decreased   General Gait Details: 50% VC's on proper sequencing and proper walker to self distance to ensure PWB.  Slow.  Unsteady.  Limited distance due to pain.   Stairs             Wheelchair Mobility    Modified Rankin (Stroke Patients Only)       Balance                                            Cognition   Behavior During Therapy: WFL for tasks assessed/performed Overall Cognitive Status: Within Functional Limits for tasks assessed                                 General Comments: AxO x 4 very pleasant and aware she was PWB 50%      Exercises      General Comments        Pertinent Vitals/Pain Pain Assessment: 0-10 Pain Score: 8  Pain Location: L hip Pain Descriptors / Indicators:  Aching;Discomfort;Sore;Operative site guarding Pain Intervention(s): Monitored during session;Premedicated before session;Repositioned;Patient requesting pain meds-RN notified    Home Living                      Prior Function            PT Goals (current goals can now be found in the care plan section) Progress towards PT goals: Progressing toward goals    Frequency    7X/week      PT Plan Current plan remains appropriate    Co-evaluation              AM-PAC PT "6 Clicks" Mobility   Outcome Measure  Help needed turning from your back to your side while in a flat bed without using bedrails?: A Little Help needed moving from lying on your back to sitting on the side of a flat bed without using bedrails?: A Little Help needed moving to and from a bed to a chair (including a  wheelchair)?: A Little Help needed standing up from a chair using your arms (e.g., wheelchair or bedside chair)?: A Little Help needed to walk in hospital room?: A Little Help needed climbing 3-5 steps with a railing? : A Lot 6 Click Score: 17    End of Session Equipment Utilized During Treatment: Gait belt Activity Tolerance: Patient limited by fatigue;Patient limited by pain Patient left: in bed;with call bell/phone within reach;with nursing/sitter in room Nurse Communication: Mobility status;Precautions;Weight bearing status PT Visit Diagnosis: Other abnormalities of gait and mobility (R26.89);Muscle weakness (generalized) (M62.81);Pain Pain - Right/Left: Left Pain - part of body: Knee;Hip     Time: 3846-6599 PT Time Calculation (min) (ACUTE ONLY): 30 min  Charges:  $Gait Training: 8-22 mins $Therapeutic Exercise: 8-22 mins                     Rica Koyanagi  PTA Acute  Rehabilitation Services Pager      [phone] Office      [phone]

## 2020-11-04 NOTE — Discharge Summary (Signed)
Patient ID: Sara Davila MRN: [id number] DOB/AGE: Oct 08, 1930 86 y.o.  Admit date: 10/31/2020 Discharge date: 11/04/2020  Admission Diagnoses:  Active Problems:   S/P left THA, AA   Discharge Diagnoses:  Same  Past Medical History:  Diagnosis Date  . Abdominal pain 02/26/2017  . Anxiety   . Arthritis    Left Knee, Wrist, Back and Hips  . Cervical vertebral fusion   . Diverticulitis   . Diverticulosis   . Fibromyalgia   . GERD (gastroesophageal reflux disease)   . Gout 02/2018   L hand  . Hypertension     after d/c from hospital no current problems or medication  . Hypothyroidism   . Pelvis fracture ([place])   . Peritoneal free air 03/29/2012  . Pneumonia   . Pneumoperitoneum 02/26/2017  . Pre-diabetes   . Seizure ([place])   . Thyroid disorder   . Toe pain 09/06/2016    Surgeries: Procedure(s): LEFT CONVERSION TO ANTERIOR TOTAL HIP on 10/31/2020   Consultants: N/A  Discharged Condition: Improved  Hospital Course: YARISSA REINING is an 86 y.o. female who was admitted 10/31/2020 for operative treatment of failed previous hip surgery. Patient has severe unremitting pain that affects sleep, daily activities, and work/hobbies. After pre-op clearance the patient was taken to the operating room on 10/31/2020 and underwent  Procedure(s): LEFT CONVERSION TO ANTERIOR TOTAL HIP.    Patient was given perioperative antibiotics:  Anti-infectives (From admission, onward)   Start     Dose/Rate Route Frequency Ordered Stop   10/31/20 1600  ceFAZolin (ANCEF) IVPB 2g/100 mL premix        2 g 200 mL/hr over 30 Minutes Intravenous Every 6 hours 10/31/20 1451 10/31/20 2204   10/31/20 0700  ceFAZolin (ANCEF) IVPB 2g/100 mL premix        2 g 200 mL/hr over 30 Minutes Intravenous On call to O.R. 10/31/20 5397 10/31/20 1032       Patient was given sequential compression devices, early ambulation, and chemoprophylaxis to prevent DVT.  Patient benefited maximally from hospital stay and there  were no complications.    Recent vital signs:  Patient Vitals for the past 24 hrs:  BP Temp Temp src Pulse Resp SpO2  11/04/20 0557 (!) 117/58 98.2 F (36.8 C) -- 84 16 95 %  11/03/20 2149 (!) 127/56 98.2 F (36.8 C) -- 80 18 98 %  11/03/20 1352 (!) 110/52 97.8 F (36.6 C) Oral 85 18 95 %     Recent laboratory studies:  Recent Labs    11/02/20 0351  WBC 9.3  HGB 8.6*  HCT 26.0*  PLT 266  NA 130*  K 4.1  CL 99  CO2 23  BUN 16  CREATININE 0.42*  GLUCOSE 105*  CALCIUM 8.0*     Discharge Medications:   Allergies as of 11/04/2020      Reactions   Codeine Rash   Penicillins Rash   Has patient had a PCN reaction causing immediate rash, facial/tongue/throat swelling, SOB or lightheadedness with hypotension: No Has patient had a PCN reaction causing severe rash involving mucus membranes or skin necrosis: No Has patient had a PCN reaction that required hospitalization: No Has patient had a PCN reaction occurring within the last 10 years: No If all of the above answers are "NO", then may proceed with Cephalosporin use. Tolerated Cephalosporin 10/31/20   Levaquin [levofloxacin]       Medication List    STOP taking these medications   aspirin 81 MG EC  tablet Replaced by: aspirin 81 MG chewable tablet   traMADol 50 MG tablet Commonly known as: ULTRAM     TAKE these medications   aspirin 81 MG chewable tablet Chew 1 tablet (81 mg total) by mouth 2 (two) times daily for 28 days. Then resume normal dose of aspirin 81 mg daily. Replaces: aspirin 81 MG EC tablet   atorvastatin 10 MG tablet Commonly known as: LIPITOR Take 10 mg by mouth daily.   atorvastatin 20 MG tablet Commonly known as: LIPITOR Take 1 tablet (20 mg total) by mouth daily.   CALCIUM 500 PO Take 500 mg by mouth daily.   DentaGel 1.1 % Gel dental gel Generic drug: sodium fluoride Place 1 application onto teeth daily.   diclofenac Sodium 1 % Gel Commonly known as: VOLTAREN Apply 2 g topically 4  (four) times daily as needed (knee pain).   docusate sodium 100 MG capsule Commonly known as: COLACE Take 100 mg by mouth daily as needed for mild constipation.   ferrous sulfate 325 (65 FE) MG tablet Take 1 tablet (325 mg total) by mouth 3 (three) times daily after meals for 14 days.   furosemide 20 MG tablet Commonly known as: LASIX Take 20 mg by mouth every Monday, Wednesday, and Friday.   HYDROcodone-acetaminophen 5-325 MG tablet Commonly known as: NORCO/VICODIN Take 1-2 tablets by mouth every 4 (four) hours as needed for severe pain.   ibandronate 150 MG tablet Commonly known as: BONIVA Take 150 mg by mouth every 30 (thirty) days. Take in the morning with a full glass of water, on an empty stomach, and do not take anything else by mouth or lie down for the next 30 min.   levETIRAcetam 500 MG tablet Commonly known as: KEPPRA Take 500 mg by mouth daily.   levothyroxine 75 MCG tablet Commonly known as: SYNTHROID Take 1 tablet (75 mcg total) by mouth daily before breakfast.   LORazepam 0.5 MG tablet Commonly known as: Ativan Take 0.5 tablets (0.25 mg total) by mouth 2 (two) times daily as needed for anxiety.   methocarbamol 500 MG tablet Commonly known as: ROBAXIN Take 1 tablet (500 mg total) by mouth every 6 (six) hours as needed for muscle spasms.   MUCINEX PO Take 400 mg by mouth every 8 (eight) hours as needed (cough).   omeprazole 20 MG capsule Commonly known as: PRILOSEC Take 20 mg by mouth daily as needed (indigestion.).   polyethylene glycol 17 g packet Commonly known as: MIRALAX / GLYCOLAX Take 17 g by mouth daily.   potassium chloride 10 MEQ CR capsule Commonly known as: MICRO-K Take 10 mEq by mouth every Monday, Wednesday, and Friday.   saccharomyces boulardii 250 MG capsule Commonly known as: FLORASTOR Take 250 mg by mouth daily.   senna-docusate 8.6-50 MG tablet Commonly known as: Senokot-S Take 2 tablets by mouth 2 (two) times daily. What  changed: how much to take   vitamin D3 50 MCG (2000 UT) Caps Take 2,000 Units by mouth daily.   zolpidem 5 MG tablet Commonly known as: AMBIEN Take 0.5 tablets (2.5 mg total) by mouth at bedtime as needed for sleep.            Discharge Care Instructions  (From admission, onward)         Start     Ordered   11/01/20 0000  Change dressing       Comments: Maintain surgical dressing until follow up in the clinic. If the edges start to pull  up, may reinforce with tape. If the dressing is no longer working, may remove and cover with gauze and tape, but must keep the area dry and clean.  Call with any questions or concerns.   11/01/20 0742          Diagnostic Studies: DG Pelvis Portable  Result Date: 10/31/2020 CLINICAL DATA:  Total left hip replacement. EXAM: PORTABLE PELVIS 1-2 VIEWS COMPARISON:  03/03/2020. FINDINGS: Total left hip replacement.  Hardware intact.  Anatomic alignment. IMPRESSION: Total left hip replacement with anatomic alignment. Electronically Signed   By: Marcello Moores  Register   On: 10/31/2020 13:39   DG C-Arm 1-60 Min-No Report  Result Date: 10/31/2020 CLINICAL DATA:  Status post left hip arthroplasty. EXAM: OPERATIVE left HIP (WITH PELVIS IF PERFORMED) 2 VIEWS TECHNIQUE: Fluoroscopic spot image(s) were submitted for interpretation post-operatively. Radiation exposure index: 1.9335 mGy. COMPARISON:  None. FINDINGS: Two intraoperative fluoroscopic images of the left hip demonstrate the left acetabular and femoral components to be well situated. IMPRESSION: Status post left total hip arthroplasty. Electronically Signed   By: Marijo Conception M.D.   On: 10/31/2020 12:00   DG HIP OPERATIVE UNILAT W OR W/O PELVIS LEFT  Result Date: 10/31/2020 CLINICAL DATA:  Status post left hip arthroplasty. EXAM: OPERATIVE left HIP (WITH PELVIS IF PERFORMED) 2 VIEWS TECHNIQUE: Fluoroscopic spot image(s) were submitted for interpretation post-operatively. Radiation exposure index: 1.9335  mGy. COMPARISON:  None. FINDINGS: Two intraoperative fluoroscopic images of the left hip demonstrate the left acetabular and femoral components to be well situated. IMPRESSION: Status post left total hip arthroplasty. Electronically Signed   By: Marijo Conception M.D.   On: 10/31/2020 12:00    Disposition:   Skilled nursing facility   Discharge Instructions    Call MD / Call 911   Complete by: As directed    If you experience chest pain or shortness of breath, CALL 911 and be transported to the hospital emergency room.  If you develope a fever above 101 F, pus (white drainage) or increased drainage or redness at the wound, or calf pain, call your surgeon's office.   Change dressing   Complete by: As directed    Maintain surgical dressing until follow up in the clinic. If the edges start to pull up, may reinforce with tape. If the dressing is no longer working, may remove and cover with gauze and tape, but must keep the area dry and clean.  Call with any questions or concerns.   Constipation Prevention   Complete by: As directed    Drink plenty of fluids.  Prune juice may be helpful.  You may use a stool softener, such as Colace (over the counter) 100 mg twice a day.  Use MiraLax (over the counter) for constipation as needed.   Diet - low sodium heart healthy   Complete by: As directed    Discharge instructions   Complete by: As directed    Maintain surgical dressing until follow up in the clinic. If the edges start to pull up, may reinforce with tape. If the dressing is no longer working, may remove and cover with gauze and tape, but must keep the area dry and clean.  Follow up in 2 weeks at [facility]. Call with any questions or concerns.   Increase activity slowly as tolerated   Complete by: As directed    Partial weight bearing with assist device as directed.   TED hose   Complete by: As directed    Use stockings (TED  hose) for 2 weeks on both leg(s).  You may remove them at night for  sleeping.       Follow-up Information    Paralee Cancel, MD. Schedule an appointment as soon as possible for a visit in 2 weeks.   Specialty: Orthopedic Surgery Contact information: [address] [place] [address] [phone]                Signed: Lucille Passy [facility] 11/04/2020, 10:32 AM

## 2020-11-04 NOTE — Progress Notes (Signed)
Patient ID: Sara Davila, female   DOB: 1930-09-16, 86 y.o.   MRN: [id number] Subjective: 4 Days Post-Op Procedure(s) (LRB): CONVERSION TO ANTERIOR TOTAL HIP (Left)    Patient reports pain as mild.  She feels like she is progressing well at this point.  Ready for transfer to skilled section of [facility] today  Objective:   VITALS:   Vitals:   11/03/20 2149 11/04/20 0557  BP: (!) 127/56 (!) 117/58  Pulse: 80 84  Resp: 18 16  Temp: 98.2 F (36.8 C) 98.2 F (36.8 C)  SpO2: 98% 95%    Neurovascular intact Incision: dressing C/D/I  LABS Recent Labs    11/02/20 0351  HGB 8.6*  HCT 26.0*  WBC 9.3  PLT 266    Recent Labs    11/02/20 0351  NA 130*  K 4.1  BUN 16  CREATININE 0.42*  GLUCOSE 105*    No results for input(s): LABPT, INR in the last 72 hours.   Assessment/Plan: 4 Days Post-Op Procedure(s) (LRB): CONVERSION TO ANTERIOR TOTAL HIP (Left)   Up with therapy  Plan to transfer to SNF today RTC in 10-12 days PWB LLE Rx on transfer chart

## 2020-11-04 NOTE — NC FL2 (Cosign Needed Addendum)
[place] LEVEL OF CARE SCREENING TOOL     IDENTIFICATION  Patient Name: Sara Davila Birthdate: 01-22-1930 Sex: female Admission Date (Current Location): 10/31/2020  [facility] and [place] Number:  Herbalist and Address:  [facility],  [place] [place], [place]      Provider Number: [id number]  Attending Physician Name and Address:  Paralee Cancel, MD  Relative Name and Phone Number:  Rodman Key [phone]    Current Level of Care: Hospital Recommended Level of Care: [place] Prior Approval Number:    Date Approved/Denied:   PASRR Number:  [id number]  Discharge Plan: SNF    Current Diagnoses: Patient Active Problem List   Diagnosis Date Noted  . S/P left THA, AA 10/31/2020  . Seizure ([place]) 07/12/2020  . Left-sided weakness 07/11/2020  . History of TIA (transient ischemic attack) 07/11/2020  . TIA (transient ischemic attack) 06/20/2020  . CVA (cerebral vascular accident) ([place]) 06/19/2020  . Asymptomatic bacteriuria 06/19/2020  . HTN (hypertension), benign 06/19/2020  . Thrombocytosis 06/19/2020  . Left hip pain 04/15/2020  . Left knee pain 03/20/2020  . Slow transit constipation 03/20/2020  . Left foot pain 03/18/2020  . Urinary retention 03/06/2020  . Blood loss anemia 03/06/2020  . Displaced intertrochanteric fracture of left femur, initial encounter for closed fracture ([place]) 03/03/2020  . Prediabetes 01/04/2020  . Memory deficit 01/04/2020  . Osteoporosis 01/04/2020  . History of vasculitis 01/04/2020  . Venous insufficiency 01/04/2020  . Neuropathy 03/30/2018  . Hyponatremia 07/21/2017  . Insomnia secondary to anxiety 07/21/2017  . GERD (gastroesophageal reflux disease) 07/21/2017  . S/P small bowel resection 07/09/2017  . Hypothyroidism 02/26/2017  . Paresthesias 09/06/2016  . Fall 03/29/2012    Orientation RESPIRATION BLADDER Height & Weight     Self, Time, Situation, Place   Normal Continent Weight: 98 lb (44.5 kg) Height:  5' (152.4 cm)  BEHAVIORAL SYMPTOMS/MOOD NEUROLOGICAL BOWEL NUTRITION STATUS      Continent Diet (Low Sodium Heart Healthy)  AMBULATORY STATUS COMMUNICATION OF NEEDS Skin   Extensive Assist Verbally Surgical wounds                       Personal Care Assistance Level of Assistance  Bathing, Feeding, Dressing Bathing Assistance: Limited assistance Feeding assistance: Independent Dressing Assistance: Limited assistance    Functional Limitations Info  Sight, Hearing, Speech Sight Info: Adequate Hearing Info: Adequate Speech Info: Adequate    SPECIAL CARE FACTORS FREQUENCY  PT (By licensed PT), OT (By licensed OT)     PT Frequency: 5x/week OT Frequency: 5x/week            Contractures Contractures Info: Not present    Additional Factors Info  Code Status, Allergies Code Status Info: Fullcode Allergies Info: Allergies: Codeine, Penicillins, Levaquin Levofloxacin           Current Medications (11/04/2020):  This is the current hospital active medication list Current Facility-Administered Medications  Medication Dose Route Frequency Provider Last Rate Last Admin  . 0.9 %  sodium chloride infusion   Intravenous Continuous Maurice March, PA-C 30 mL/hr at 11/01/20 0315 Rate Change at 11/01/20 0315  . acetaminophen (TYLENOL) tablet 325-650 mg  325-650 mg Oral Q6H PRN Maurice March, PA-C   650 mg at 11/03/20 1633  . alum & mag hydroxide-simeth (MAALOX/MYLANTA) 200-200-20 MG/5ML suspension 15 mL  15 mL Oral Q4H PRN Maurice March, PA-C      . aspirin  chewable tablet 81 mg  81 mg Oral BID Maurice March, PA-C   81 mg at 11/04/20 1007  . atorvastatin (LIPITOR) tablet 10 mg  10 mg Oral q1800 Maurice March, PA-C   10 mg at 11/03/20 1633  . bisacodyl (DULCOLAX) suppository 10 mg  10 mg Rectal Daily PRN Maurice March, PA-C      . dexamethasone (DECADRON) injection 10 mg  10 mg Intravenous Once Stinson, Ashley  R, PA-C      . diphenhydrAMINE (BENADRYL) 12.5 MG/5ML elixir 12.5-25 mg  12.5-25 mg Oral Q4H PRN Maurice March, PA-C      . docusate sodium (COLACE) capsule 100 mg  100 mg Oral BID Maurice March, PA-C   100 mg at 11/03/20 1610  . ferrous sulfate tablet 325 mg  325 mg Oral TID PC Maurice March, PA-C   325 mg at 11/04/20 1007  . furosemide (LASIX) tablet 20 mg  20 mg Oral Q M,W,F Maurice March, PA-C   20 mg at 11/04/20 1007  . guaiFENesin (MUCINEX) 12 hr tablet 600 mg  600 mg Oral BID PRN Maurice March, PA-C      . HYDROcodone-acetaminophen (NORCO) 7.5-325 MG per tablet 1-2 tablet  1-2 tablet Oral Q4H PRN Maurice March, PA-C   1 tablet at 11/04/20 1007  . HYDROcodone-acetaminophen (NORCO/VICODIN) 5-325 MG per tablet 1-2 tablet  1-2 tablet Oral Q4H PRN Maurice March, PA-C      . levETIRAcetam (KEPPRA) tablet 500 mg  500 mg Oral Daily Maurice March, PA-C   500 mg at 11/03/20 2110  . levothyroxine (SYNTHROID) tablet 75 mcg  75 mcg Oral QAC breakfast Maurice March, [place]   75 mcg at 11/04/20 9604  . LORazepam (ATIVAN) tablet 0.25 mg  0.25 mg Oral BID PRN Maurice March, PA-C   0.25 mg at 11/04/20 0309  . menthol-cetylpyridinium (CEPACOL) lozenge 3 mg  1 lozenge Oral PRN Griffith Citron R, PA-C       Or  . phenol (CHLORASEPTIC) mouth spray 1 spray  1 spray Mouth/Throat PRN Maurice March, PA-C      . methocarbamol (ROBAXIN) tablet 500 mg  500 mg Oral Q6H PRN Maurice March, PA-C   500 mg at 11/04/20 1007   Or  . methocarbamol (ROBAXIN) 500 mg in dextrose 5 % 50 mL IVPB  500 mg Intravenous Q6H PRN Maurice March, PA-C 100 mL/hr at 10/31/20 1248 500 mg at 10/31/20 1248  . metoCLOPramide (REGLAN) tablet 5-10 mg  5-10 mg Oral Q8H PRN Griffith Citron R, PA-C       Or  . metoCLOPramide (REGLAN) injection 5-10 mg  5-10 mg Intravenous Q8H PRN Maurice March, PA-C      . morphine 2 MG/ML injection 0.5-1 mg  0.5-1 mg Intravenous Q2H PRN Maurice March, PA-C       . ondansetron (ZOFRAN) tablet 4 mg  4 mg Oral Q6H PRN Maurice March, PA-C       Or  . ondansetron (ZOFRAN) injection 4 mg  4 mg Intravenous Q6H PRN Maurice March, PA-C      . polyethylene glycol (MIRALAX / GLYCOLAX) packet 17 g  17 g Oral BID Maurice March, PA-C   17 g at 11/02/20 2113  . zolpidem (AMBIEN) tablet 2.5 mg  2.5 mg Oral QHS PRN Maurice March, PA-C   2.5 mg at 11/01/20 2229     Discharge Medications: Please see  discharge summary for a list of discharge medications.  Relevant Imaging Results:  Relevant Lab Results:   Additional Information SS# [id number]  Lia Hopping, LCSW

## 2020-11-05 ENCOUNTER — Non-Acute Institutional Stay (SKILLED_NURSING_FACILITY): Payer: Medicare Other | Admitting: Internal Medicine

## 2020-11-05 ENCOUNTER — Encounter: Payer: Self-pay | Admitting: Internal Medicine

## 2020-11-05 DIAGNOSIS — I10 Essential (primary) hypertension: Secondary | ICD-10-CM

## 2020-11-05 DIAGNOSIS — R600 Localized edema: Secondary | ICD-10-CM

## 2020-11-05 DIAGNOSIS — D649 Anemia, unspecified: Secondary | ICD-10-CM

## 2020-11-05 DIAGNOSIS — K5901 Slow transit constipation: Secondary | ICD-10-CM

## 2020-11-05 DIAGNOSIS — R569 Unspecified convulsions: Secondary | ICD-10-CM | POA: Diagnosis not present

## 2020-11-05 DIAGNOSIS — F5105 Insomnia due to other mental disorder: Secondary | ICD-10-CM

## 2020-11-05 DIAGNOSIS — M8000XS Age-related osteoporosis with current pathological fracture, unspecified site, sequela: Secondary | ICD-10-CM

## 2020-11-05 DIAGNOSIS — Z96642 Presence of left artificial hip joint: Secondary | ICD-10-CM

## 2020-11-05 DIAGNOSIS — E039 Hypothyroidism, unspecified: Secondary | ICD-10-CM

## 2020-11-05 DIAGNOSIS — F419 Anxiety disorder, unspecified: Secondary | ICD-10-CM

## 2020-11-05 DIAGNOSIS — E871 Hypo-osmolality and hyponatremia: Secondary | ICD-10-CM | POA: Diagnosis not present

## 2020-11-05 DIAGNOSIS — K219 Gastro-esophageal reflux disease without esophagitis: Secondary | ICD-10-CM

## 2020-11-05 MED ORDER — LORAZEPAM 0.5 MG PO TABS: 0.2500 mg | ORAL_TABLET | Freq: Two times a day (BID) | ORAL | 0 refills | Status: DC | PRN

## 2020-11-05 NOTE — Progress Notes (Addendum)
Provider:  Veleta Miners MD Location:   [facility] Room Number: 30 Place of Service:  SNF ([phone])  PCP: [name], MD Patient Care Team: [name], MD as PCP - General (Internal Medicine)  Extended Emergency Contact Information Primary Emergency Contact: Lear Ng) Home Phone: [phone] Mobile Phone: [phone] Relation: Son Secondary Emergency Contact: Rosina Lowenstein [place] Phone: [phone] Relation: Daughter  Code Status: DNR Goals of Care: Advanced Directive information Advanced Directives 11/05/2020  Does Patient Have a Medical Advance Directive? Yes  Type of Advance Directive Out of facility DNR (pink MOST or yellow form);Living will  Does patient want to make changes to medical advance directive? No - Patient declined  Copy of [place] in Chart? -  Would patient like information on creating a medical advance directive? -  Pre-existing out of facility DNR order (yellow form or pink MOST form) Pink MOST form placed in chart (order not valid for inpatient use);Yellow form placed in chart (order not valid for inpatient use)      Chief Complaint  Patient presents with  . New Admit To SNF    Admission to SNF    HPI: Patient is a 86 y.o. female seen today for admission to SNF for therapy  Patient was electively admitted for Revision of Left Hip surgery  Patient was admitted from 12/2 to 12/06 Underwent left total hip arthroplasty with anterior approach on 12/02 Patient is now in SNF for therapy. She is already walking with her walker.  Pain seems to be controlled with hydrocodone on and Robaxin. She denied any complaints of dizziness.  No chest pain or shortness of breath   Past Medical History:  Diagnosis Date  . Abdominal pain 02/26/2017  . Anxiety   . Arthritis    Left Knee, Wrist, Back and Hips  . Cervical vertebral fusion   . Diverticulitis   . Diverticulosis   .  Fibromyalgia   . GERD (gastroesophageal reflux disease)   . Gout 02/2018   L hand  . Hypertension     after d/c from hospital no current problems or medication  . Hypothyroidism   . Pelvis fracture ([place])   . Peritoneal free air 03/29/2012  . Pneumonia   . Pneumoperitoneum 02/26/2017  . Pre-diabetes   . Seizure ([place])   . Thyroid disorder   . Toe pain 09/06/2016   Past Surgical History:  Procedure Laterality Date  . ABDOMINAL HYSTERECTOMY    . BOWEL RESECTION N/A 07/09/2017   Procedure: SMALL BOWEL RESECTION;  Surgeon: Johnathan Hausen, MD;  Location: [facility];  Service: General;  Laterality: N/A;  . CERVICAL FUSION     x2  . CONVERSION TO TOTAL HIP Left 10/31/2020   Procedure: CONVERSION TO ANTERIOR TOTAL HIP;  Surgeon: Paralee Cancel, MD;  Location: [facility];  Service: Orthopedics;  Laterality: Left;  2 hrs  . FEMUR IM NAIL Left 03/03/2020   Procedure: INTRAMEDULLARY (IM) NAIL FEMORAL;  Surgeon: Paralee Cancel, MD;  Location: [facility];  Service: Orthopedics;  Laterality: Left;  . LAPAROSCOPIC APPENDECTOMY N/A 03/04/2017   Procedure: APPENDECTOMY LAPAROSCOPIC;  Surgeon: Johnathan Hausen, MD;  Location: [facility];  Service: General;  Laterality: N/A;  . LAPAROSCOPY N/A 03/04/2017   Procedure: LAPAROSCOPY DIAGNOSTIC, ENTEROLYSIS;  Surgeon: Johnathan Hausen, MD;  Location: [facility];  Service: General;  Laterality: N/A;  . LAPAROTOMY N/A 07/09/2017   Procedure: EXPLORATORY LAPAROTOMY;  Surgeon: Johnathan Hausen, MD;  Location: [facility];  Service:  General;  Laterality: N/A;  . SPINE SURGERY     Lumbar- rod placement  . TONSILLECTOMY     86y/o  . TOTAL VAGINAL HYSTERECTOMY      reports that she has quit smoking. She has never used smokeless tobacco. She reports previous alcohol use. She reports that she does not use drugs. Social History   Socioeconomic History  . Marital status: Widowed    Spouse name: Not on file  . Number of children: 2  . Years of education: Therapist, sports  . Highest education level: Not on file    Occupational History  . Occupation: Retired   Tobacco Use  . Smoking status: Former Research scientist (life sciences)  . Smokeless tobacco: Never Used  . Tobacco comment: Smoked from age [age]  Vaping Use  . Vaping Use: Never used  Substance and Sexual Activity  . Alcohol use: Not Currently    Comment: occasional glass of wine  . Drug use: No  . Sexual activity: Not Currently  Other Topics Concern  . Not on file  Social History Narrative   Lives at [facility]    Caffeine use: occasional cup of coffee, 2-3 times a week   Left handed         Diet: Regular      Do you drink/ eat things with caffeine? No      Marital status: Widowed                              What year were you married ? 1986      Do you live in a house, apartment,assistred living, condo, trailer, etc.)? [place]       Is it one or more stories? 4 th floor      How many persons live in your home ? 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  Yes                            Type & how often 5-6 x WK      ADVANCED DIRECTIVES (Please bring copies)      Do you have a living will? Yes      Do you have a DNR form?   Yes                    If not, do you want to discuss one?       Do you have signed POA?HPOA forms?   Yes              If so, please bring to your appointment      FUNCTIONAL STATUS- To be completed by Spouse / child / Staff       Do you have difficulty bathing or dressing yourself ? No      Do you have difficulty preparing food or eating ?  No      Do you have difficulty managing your mediation ?No      Do you have difficulty managing your finances ? No      Do you have difficulty affording your medication ? No      Social Determinants  of Health   Financial Resource Strain:   . Difficulty of Paying Living Expenses: Not on file   Food Insecurity:   . Worried About Charity fundraiser in the Last Year: Not on file  . Ran Out of Food in the Last Year: Not on file  Transportation Needs:   . Lack of Transportation (Medical): Not on file  . Lack of Transportation (Non-Medical): Not on file  Physical Activity:   . Days of Exercise per Week: Not on file  . Minutes of Exercise per Session: Not on file  Stress:   . Feeling of Stress : Not on file  Social Connections:   . Frequency of Communication with Friends and Family: Not on file  . Frequency of Social Gatherings with Friends and Family: Not on file  . Attends Religious Services: Not on file  . Active Member of Clubs or Organizations: Not on file  . Attends Archivist Meetings: Not on file  . Marital Status: Not on file  Intimate Partner Violence:   . Fear of Current or Ex-Partner: Not on file  . Emotionally Abused: Not on file  . Physically Abused: Not on file  . Sexually Abused: Not on file    Functional Status Survey:    Family History  Problem Relation Age of Onset  . Asthma Mother   . Pulmonary embolism Mother   . Macular degeneration Mother   . Heart disease Father   . Stroke Father   . Stroke Paternal Uncle   . Breast cancer Maternal Aunt   . Macular degeneration Sister   . Stroke Sister   . Neuropathy Neg Hx     Health Maintenance  Topic Date Due  . TETANUS/TDAP  Never done  . DEXA SCAN  Never done  . PNA vac Low Risk Adult (1 of 2 - PCV13) Never done  . INFLUENZA VACCINE  Completed  . COVID-19 Vaccine  Completed    Allergies  Allergen Reactions  . Codeine Rash  . Penicillins Rash    Has patient had a PCN reaction causing immediate rash, facial/tongue/throat swelling, SOB or lightheadedness with hypotension: No Has patient had a PCN reaction causing severe rash involving mucus membranes or skin necrosis: No Has patient had a PCN reaction that required hospitalization: No Has patient had a PCN reaction occurring within  the last 10 years: No If all of the above answers are "NO", then may proceed with Cephalosporin use.  Tolerated Cephalosporin 10/31/20    . Levaquin [Levofloxacin]     Allergies as of 11/05/2020      Reactions   Codeine Rash   Penicillins Rash   Has patient had a PCN reaction causing immediate rash, facial/tongue/throat swelling, SOB or lightheadedness with hypotension: No Has patient had a PCN reaction causing severe rash involving mucus membranes or skin necrosis: No Has patient had a PCN reaction that required hospitalization: No Has patient had a PCN reaction occurring within the last 10 years: No If all of the above answers are "NO", then may proceed with Cephalosporin use. Tolerated Cephalosporin 10/31/20   Levaquin [levofloxacin]       Medication List       Accurate as of November 05, 2020  9:23 AM. If you have any questions, ask your nurse or doctor.        aspirin 81 MG chewable tablet Chew 1 tablet (81 mg total) by mouth 2 (two) times daily for 28 days. Then resume normal dose of aspirin 81  mg daily.   atorvastatin 10 MG tablet Commonly known as: LIPITOR Take 10 mg by mouth daily. What changed: Another medication with the same name was removed. Continue taking this medication, and follow the directions you see here. Changed by: [name], MD   CALCIUM 500 PO Take 500 mg by mouth daily.   DentaGel 1.1 % Gel dental gel Generic drug: sodium fluoride Place 1 application onto teeth daily.   diclofenac Sodium 1 % Gel Commonly known as: VOLTAREN Apply 2 g topically 4 (four) times daily as needed (knee pain).   docusate sodium 100 MG capsule Commonly known as: COLACE Take 100 mg by mouth daily as needed for mild constipation.   ferrous sulfate 325 (65 FE) MG tablet Take 325 mg by mouth daily with breakfast. What changed: Another medication with the same name was removed. Continue taking this medication, and follow the directions you see here. Changed by:  [name], MD   furosemide 20 MG tablet Commonly known as: LASIX Take 20 mg by mouth every Monday, Wednesday, and Friday.   HYDROcodone-acetaminophen 5-325 MG tablet Commonly known as: NORCO/VICODIN Take 1-2 tablets by mouth every 4 (four) hours as needed for severe pain.   ibandronate 150 MG tablet Commonly known as: BONIVA Take 150 mg by mouth every 30 (thirty) days. Take in the morning with a full glass of water, on an empty stomach, and do not take anything else by mouth or lie down for the next 30 min.   levETIRAcetam 500 MG tablet Commonly known as: KEPPRA Take 500 mg by mouth daily.   levothyroxine 75 MCG tablet Commonly known as: SYNTHROID Take 1 tablet (75 mcg total) by mouth daily before breakfast.   LORazepam 0.5 MG tablet Commonly known as: Ativan Take 0.5 tablets (0.25 mg total) by mouth 2 (two) times daily as needed for anxiety.   methocarbamol 500 MG tablet Commonly known as: ROBAXIN Take 250 mg by mouth every 6 (six) hours as needed for muscle spasms. What changed: Another medication with the same name was removed. Continue taking this medication, and follow the directions you see here. Changed by: [name], MD   MUCINEX PO Take 400 mg by mouth every 8 (eight) hours as needed (cough).   omeprazole 20 MG capsule Commonly known as: PRILOSEC Take 20 mg by mouth daily as needed (indigestion.).   polyethylene glycol 17 g packet Commonly known as: MIRALAX / GLYCOLAX Take 17 g by mouth daily.   potassium chloride 10 MEQ CR capsule Commonly known as: MICRO-K Take 10 mEq by mouth every Monday, Wednesday, and Friday.   saccharomyces boulardii 250 MG capsule Commonly known as: FLORASTOR Take 250 mg by mouth daily.   senna-docusate 8.6-50 MG tablet Commonly known as: Senokot-S Take 2 tablets by mouth 2 (two) times daily. What changed: how much to take   vitamin D3 50 MCG (2000 UT) Caps Take 2,000 Units by mouth daily.   zolpidem 5 MG  tablet Commonly known as: AMBIEN Take 0.5 tablets (2.5 mg total) by mouth at bedtime as needed for sleep.       Review of Systems  Review of Systems  Constitutional: Negative for activity change, appetite change, chills, diaphoresis, fatigue and fever.  HENT: Negative for mouth sores, postnasal drip, rhinorrhea, sinus pain and sore throat.   Respiratory: Negative for apnea, cough, chest tightness, shortness of breath and wheezing.   Cardiovascular: Negative for chest pain, palpitations and leg swelling.  Gastrointestinal: Negative for abdominal distention,  abdominal pain, constipation, diarrhea, nausea and vomiting.  Genitourinary: Negative for dysuria and frequency.  Musculoskeletal: Negative for arthralgias, joint swelling and myalgias.  Skin: Negative for rash.  Neurological: Negative for dizziness, syncope, weakness, light-headedness and numbness.  Psychiatric/Behavioral: Negative for behavioral problems, confusion and sleep disturbance.     Vitals:   11/05/20 0911  BP: 128/72  Pulse: 87  Resp: 18  Temp: 98.2 F (36.8 C)  SpO2: 94%  Weight: 95 lb (43.1 kg)  Height: 5' (1.524 m)   Body mass index is 18.55 kg/m. Physical Exam Constitutional: Oriented to person, place, and time. Well-developed and well-nourished.  HENT:  Head: Normocephalic.  Mouth/Throat: Oropharynx is clear and moist.  Eyes: Pupils are equal, round, and reactive to light.  Neck: Neck supple.  Cardiovascular: Normal rate and normal heart sounds.  No murmur heard. Pulmonary/Chest: Effort normal and breath sounds normal. No respiratory distress. No wheezes. She has no rales.  Abdominal: Soft. Bowel sounds are normal. No distension. There is no tenderness. There is no rebound.  Musculoskeletal:Mild Edema Bilateral.  Lymphadenopathy: none Neurological: Alert and oriented to person, place, and time.  Skin: Skin is warm and dry.  Psychiatric: Normal mood and affect. Behavior is normal. Thought content  normal.   Labs reviewed: Basic Metabolic Panel: Recent Labs    10/21/20 1212 11/01/20 0315 11/02/20 0351  NA 133* 133* 130*  K 5.6* 4.1 4.1  CL 97* 103 99  CO2 28 23 23   GLUCOSE 92 132* 105*  BUN 23 13 16   CREATININE 0.71 0.49 0.42*  CALCIUM 9.8 8.1* 8.0*   Liver Function Tests: Recent Labs    04/17/20 0000 06/13/20 0700 06/19/20 1248 06/19/20 1248 07/11/20 0914 07/25/20 0000 07/30/20 0000  AST   < > 19 24   < > 24 19 17   ALT   < > 13 14   < > 19 14 13   ALKPHOS   < >  --  55   < > 62 63 66  BILITOT  --  0.6 0.6  --  0.8  --   --   PROT  --  6.6 6.9  --  6.8  --   --   ALBUMIN   < >  --  3.7   < > 3.9 3.9 4.0   < > = values in this interval not displayed.   No results for input(s): LIPASE, AMYLASE in the last 8760 hours. No results for input(s): AMMONIA in the last 8760 hours. CBC: Recent Labs    04/17/20 0000 07/11/20 0914 07/11/20 0914 07/25/20 0000 07/25/20 0000 07/30/20 0000 10/21/20 1212 11/01/20 0315 11/02/20 0351  WBC   < > 7.1   < > 5.9   < > 6.5 5.7 7.6 9.3  NEUTROABS  --  5.5  --  3,428  --  3,900  --   --   --   HGB   < > 13.2   < > 12.3   < > 12.8 13.0 8.8* 8.6*  HCT   < > 40.3   < > 37   < > 37 39.0 26.4* 26.0*  MCV  --  96.0   < >  --   --   --  98.0 98.1 99.6  PLT   < > 521*   < > 636*   < > 519* 410* 289 266   < > = values in this interval not displayed.   Cardiac Enzymes: No results for input(s): CKTOTAL, CKMB, CKMBINDEX, TROPONINI in  the last 8760 hours. BNP: Invalid input(s): POCBNP Lab Results  Component Value Date   HGBA1C 5.8 (H) 10/21/2020   Lab Results  Component Value Date   TSH 2.389 06/19/2020   Lab Results  Component Value Date   [id number] 833 12/01/2017   Lab Results  Component Value Date   FOLATE 19.8 12/01/2017   No results found for: IRON, TIBC, FERRITIN  Imaging and Procedures obtained prior to SNF admission: No results found.  Assessment/Plan S/P total left hip arthroplasty Partial Weight  bearing On Higher dose of aspirin Pain control on Robaxin and Norco Doing well with therapy Follow up with Ortho .Anemia Postop anemia We will continue her on iron Repeat CBC H/o Seizure ([place]) Continue on Keprra  HTN (hypertension), benign Off Norvasc right now Only on Lasix low dose Hypothyroidism, unspecified type TSH normal in 7/21 Hyponatremia Repeat BMP Insomnia secondary to anxiety Ambien Low dose Gastroesophageal reflux disease without esophagitis On Prilosec Slow transit constipation Miralax and Senna Age-related osteoporosis with current pathological fracture, sequela Continue Bonivia Bilateral leg edema On Low dose of lasix Hyperlipidemia She was started on Lipitor went Neurology thought  patient has had TIA But it was Seizures and  she was started on Keppra Patient wants her Lipitor to be discontinued at this time.  We will revisit it in few weeks   Family/ staff Communication:   Labs/tests ordered: CMP,CBC

## 2020-11-12 LAB — CBC AND DIFFERENTIAL
HCT: 30 — AB (ref 36–46)
Hemoglobin: 10.3 — AB (ref 12.0–16.0)
Neutrophils Absolute: 5377
Platelets: 838 — AB (ref 150–399)
WBC: 8.7

## 2020-11-12 LAB — CBC: RBC: 3.07 — AB (ref 3.87–5.11)

## 2020-11-15 ENCOUNTER — Encounter: Payer: Self-pay | Admitting: Internal Medicine

## 2020-11-15 ENCOUNTER — Non-Acute Institutional Stay (SKILLED_NURSING_FACILITY): Payer: Medicare Other | Admitting: Internal Medicine

## 2020-11-15 DIAGNOSIS — F5105 Insomnia due to other mental disorder: Secondary | ICD-10-CM

## 2020-11-15 DIAGNOSIS — I10 Essential (primary) hypertension: Secondary | ICD-10-CM

## 2020-11-15 DIAGNOSIS — M8000XS Age-related osteoporosis with current pathological fracture, unspecified site, sequela: Secondary | ICD-10-CM

## 2020-11-15 DIAGNOSIS — F419 Anxiety disorder, unspecified: Secondary | ICD-10-CM

## 2020-11-15 DIAGNOSIS — K219 Gastro-esophageal reflux disease without esophagitis: Secondary | ICD-10-CM

## 2020-11-15 DIAGNOSIS — E039 Hypothyroidism, unspecified: Secondary | ICD-10-CM | POA: Diagnosis not present

## 2020-11-15 DIAGNOSIS — E871 Hypo-osmolality and hyponatremia: Secondary | ICD-10-CM

## 2020-11-15 DIAGNOSIS — Z96642 Presence of left artificial hip joint: Secondary | ICD-10-CM

## 2020-11-15 DIAGNOSIS — R600 Localized edema: Secondary | ICD-10-CM

## 2020-11-15 DIAGNOSIS — R569 Unspecified convulsions: Secondary | ICD-10-CM

## 2020-11-15 DIAGNOSIS — D649 Anemia, unspecified: Secondary | ICD-10-CM

## 2020-11-15 NOTE — Progress Notes (Signed)
Location:   [facility] Room Number: 30 Place of Service:  SNF [id number])  Provider: Veleta Miners MD  PCP: [name], MD Patient Care Team: [name], MD as PCP - General (Internal Medicine)  Extended Emergency Contact Information Primary Emergency Contact: Lear Ng) Home Phone: [phone] Mobile Phone: [phone] Relation: Son Secondary Emergency Contact: Rosina Lowenstein [place] Phone: [phone] Relation: Daughter  Code Status: DNR Goals of care:  Advanced Directive information Advanced Directives 11/05/2020  Does Patient Have a Medical Advance Directive? Yes  Type of Advance Directive Out of facility DNR (pink MOST or yellow form);Living will  Does patient want to make changes to medical advance directive? No - Patient declined  Copy of [place] in Chart? -  Would patient like information on creating a medical advance directive? -  Pre-existing out of facility DNR order (yellow form or pink MOST form) Pink MOST form placed in chart (order not valid for inpatient use);Yellow form placed in chart (order not valid for inpatient use)     Allergies  Allergen Reactions  . Codeine Rash  . Penicillins Rash    Has patient had a PCN reaction causing immediate rash, facial/tongue/throat swelling, SOB or lightheadedness with hypotension: No Has patient had a PCN reaction causing severe rash involving mucus membranes or skin necrosis: No Has patient had a PCN reaction that required hospitalization: No Has patient had a PCN reaction occurring within the last 10 years: No If all of the above answers are "NO", then may proceed with Cephalosporin use.  Tolerated Cephalosporin 10/31/20    . Levaquin [Levofloxacin]     Chief Complaint  Patient presents with  . Discharge Note    Discharge from SNF    HPI:  86 y.o. female  Seen for discharge from SNF to AL   Patient was electively admitted in  hospital for Revision of Left Hip surgery  Patient was admitted from 12/2 to 12/06 Underwent left total hip arthroplasty with anterior approach on 12/02  She also has h/o hypothyroidism, Osteoporosis, Arthritis and GERDand Depression and Anxiety also Insomnia. And Recent h/o Seizures with Left sided weakness  She did well in [facility] for therapy Is doing all her ADLS She is suppose to be 50% Weight bearing on Left Leg Working with therapy Transferring herself to Wheelchair and able to ambulate with mild assist Pain controlled   Past Medical History:  Diagnosis Date  . Abdominal pain 02/26/2017  . Anxiety   . Arthritis    Left Knee, Wrist, Back and Hips  . Cervical vertebral fusion   . Diverticulitis   . Diverticulosis   . Fibromyalgia   . GERD (gastroesophageal reflux disease)   . Gout 02/2018   L hand  . Hypertension     after d/c from hospital no current problems or medication  . Hypothyroidism   . Pelvis fracture ([place])   . Peritoneal free air 03/29/2012  . Pneumonia   . Pneumoperitoneum 02/26/2017  . Pre-diabetes   . Seizure ([place])   . Thyroid disorder   . Toe pain 09/06/2016    Past Surgical History:  Procedure Laterality Date  . ABDOMINAL HYSTERECTOMY    . BOWEL RESECTION N/A 07/09/2017   Procedure: SMALL BOWEL RESECTION;  Surgeon: Johnathan Hausen, MD;  Location: [facility];  Service: General;  Laterality: N/A;  . CERVICAL FUSION     x2  . CONVERSION TO TOTAL HIP Left 10/31/2020   Procedure: CONVERSION TO  ANTERIOR TOTAL HIP;  Surgeon: Paralee Cancel, MD;  Location: [facility];  Service: Orthopedics;  Laterality: Left;  2 hrs  . FEMUR IM NAIL Left 03/03/2020   Procedure: INTRAMEDULLARY (IM) NAIL FEMORAL;  Surgeon: Paralee Cancel, MD;  Location: [facility];  Service: Orthopedics;  Laterality: Left;  . LAPAROSCOPIC APPENDECTOMY N/A 03/04/2017   Procedure: APPENDECTOMY LAPAROSCOPIC;  Surgeon: Johnathan Hausen, MD;  Location: [facility];  Service: General;  Laterality: N/A;  . LAPAROSCOPY N/A  03/04/2017   Procedure: LAPAROSCOPY DIAGNOSTIC, ENTEROLYSIS;  Surgeon: Johnathan Hausen, MD;  Location: [facility];  Service: General;  Laterality: N/A;  . LAPAROTOMY N/A 07/09/2017   Procedure: EXPLORATORY LAPAROTOMY;  Surgeon: Johnathan Hausen, MD;  Location: [facility];  Service: General;  Laterality: N/A;  . SPINE SURGERY     Lumbar- rod placement  . TONSILLECTOMY     86y/o  . TOTAL VAGINAL HYSTERECTOMY        reports that she has quit smoking. She has never used smokeless tobacco. She reports previous alcohol use. She reports that she does not use drugs. Social History   Socioeconomic History  . Marital status: Widowed    Spouse name: Not on file  . Number of children: 2  . Years of education: Therapist, sports  . Highest education level: Not on file  Occupational History  . Occupation: Retired   Tobacco Use  . Smoking status: Former Research scientist (life sciences)  . Smokeless tobacco: Never Used  . Tobacco comment: Smoked from age [age]  Vaping Use  . Vaping Use: Never used  Substance and Sexual Activity  . Alcohol use: Not Currently    Comment: occasional glass of wine  . Drug use: No  . Sexual activity: Not Currently  Other Topics Concern  . Not on file  Social History Narrative   Lives at [facility] independent living    Caffeine use: occasional cup of coffee, 2-3 times a week   Left handed         Diet: Regular      Do you drink/ eat things with caffeine? No      Marital status: Widowed                              What year were you married ? 1986      Do you live in a house, apartment,assistred living, condo, trailer, etc.)? [place]       Is it one or more stories? 4 th floor      How many persons live in your home ? 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  Yes                            Type & how often 5-6 x WK      ADVANCED  DIRECTIVES (Please bring copies)      Do you have a living will? Yes      Do you have a DNR form?   Yes                    If not, do you want to discuss one?  Do you have signed POA?HPOA forms?   Yes              If so, please bring to your appointment      FUNCTIONAL STATUS- To be completed by Spouse / child / Staff       Do you have difficulty bathing or dressing yourself ? No      Do you have difficulty preparing food or eating ?  No      Do you have difficulty managing your mediation ?No      Do you have difficulty managing your finances ? No      Do you have difficulty affording your medication ? No      Social Determinants of Radio broadcast assistant Strain: Not on file  Food Insecurity: Not on file  Transportation Needs: Not on file  Physical Activity: Not on file  Stress: Not on file  Social Connections: Not on file  Intimate Partner Violence: Not on file   Functional Status Survey:    Allergies  Allergen Reactions  . Codeine Rash  . Penicillins Rash    Has patient had a PCN reaction causing immediate rash, facial/tongue/throat swelling, SOB or lightheadedness with hypotension: No Has patient had a PCN reaction causing severe rash involving mucus membranes or skin necrosis: No Has patient had a PCN reaction that required hospitalization: No Has patient had a PCN reaction occurring within the last 10 years: No If all of the above answers are "NO", then may proceed with Cephalosporin use.  Tolerated Cephalosporin 10/31/20    . Levaquin [Levofloxacin]     Pertinent  Health Maintenance Due  Topic Date Due  . DEXA SCAN  Never done  . PNA vac Low Risk Adult (1 of 2 - PCV13) Never done  . INFLUENZA VACCINE  Completed    Medications: Allergies as of 11/15/2020      Reactions   Codeine Rash   Penicillins Rash   Has patient had a PCN reaction causing immediate rash, facial/tongue/throat swelling, SOB or lightheadedness with hypotension: No Has  patient had a PCN reaction causing severe rash involving mucus membranes or skin necrosis: No Has patient had a PCN reaction that required hospitalization: No Has patient had a PCN reaction occurring within the last 10 years: No If all of the above answers are "NO", then may proceed with Cephalosporin use. Tolerated Cephalosporin 10/31/20   Levaquin [levofloxacin]       Medication List       Accurate as of November 15, 2020  3:16 PM. If you have any questions, ask your nurse or doctor.        STOP taking these medications   docusate sodium 100 MG capsule Commonly known as: COLACE Stopped by: [name], MD   ferrous sulfate 325 (65 FE) MG tablet Stopped by: [name], MD     TAKE these medications   aspirin 81 MG chewable tablet Chew 1 tablet (81 mg total) by mouth 2 (two) times daily for 28 days. Then resume normal dose of aspirin 81 mg daily.   CALCIUM 500 PO Take 500 mg by mouth daily.   doxylamine (Sleep) 25 MG tablet Commonly known as: UNISOM Take 25 mg by mouth at bedtime as needed.   furosemide 20 MG tablet Commonly known as: LASIX Take 20 mg by mouth every Monday, Wednesday, and Friday.   HYDROcodone-acetaminophen 5-325 MG tablet Commonly known as: NORCO/VICODIN Take 1-2 tablets by mouth every 4 (four) hours  as needed for severe pain.   ibandronate 150 MG tablet Commonly known as: BONIVA Take 150 mg by mouth every 30 (thirty) days. Take in the morning with a full glass of water, on an empty stomach, and do not take anything else by mouth or lie down for the next 30 min.   levETIRAcetam 500 MG tablet Commonly known as: KEPPRA Take 500 mg by mouth daily.   levothyroxine 75 MCG tablet Commonly known as: SYNTHROID Take 1 tablet (75 mcg total) by mouth daily before breakfast.   LORazepam 0.5 MG tablet Commonly known as: Ativan Take 0.5 tablets (0.25 mg total) by mouth 2 (two) times daily as needed for anxiety.   methocarbamol 500 MG  tablet Commonly known as: ROBAXIN Take 250 mg by mouth every 6 (six) hours as needed for muscle spasms.   omeprazole 20 MG capsule Commonly known as: PRILOSEC Take 20 mg by mouth daily as needed (indigestion.).   polyethylene glycol 17 g packet Commonly known as: MIRALAX / GLYCOLAX Take 17 g by mouth daily.   potassium chloride 10 MEQ CR capsule Commonly known as: MICRO-K Take 10 mEq by mouth every Monday, Wednesday, and Friday.   senna-docusate 8.6-50 MG tablet Commonly known as: Senokot-S Take 2 tablets by mouth 2 (two) times daily. What changed: how much to take   vitamin D3 50 MCG (2000 UT) Caps Take 2,000 Units by mouth daily.   zolpidem 5 MG tablet Commonly known as: AMBIEN Take 0.5 tablets (2.5 mg total) by mouth at bedtime as needed for sleep.       Review of Systems  Vitals:   11/15/20 1503  BP: 130/80  Pulse: 86  Resp: 18  Temp: 98.4 F (36.9 C)  SpO2: 95%  Weight: 99 lb 4.8 oz (45 kg)  Height: 5' (1.524 m)   Body mass index is 19.39 kg/m. Physical Exam  Constitutional: Oriented to person, place, and time. Well-developed and well-nourished.  HENT:  Head: Normocephalic.  Mouth/Throat: Oropharynx is clear and moist.  Eyes: Pupils are equal, round, and reactive to light.  Neck: Neck supple.  Cardiovascular: Normal rate and normal heart sounds.  No murmur heard. Pulmonary/Chest: Effort normal and breath sounds normal. No respiratory distress. No wheezes. She has no rales.  Abdominal: Soft. Bowel sounds are normal. No distension. There is no tenderness. There is no rebound.  Musculoskeletal: Mild edema Bilateral Left more then right  Lymphadenopathy: none Neurological: Alert and oriented to person, place, and time.  Skin: Skin is warm and dry.  Psychiatric: Normal mood and affect. Behavior is normal. Thought content normal.    Labs reviewed: Basic Metabolic Panel: Recent Labs    10/21/20 1212 11/01/20 0315 11/02/20 0351  NA 133* 133* 130*   K 5.6* 4.1 4.1  CL 97* 103 99  CO2 28 23 23   GLUCOSE 92 132* 105*  BUN 23 13 16   CREATININE 0.71 0.49 0.42*  CALCIUM 9.8 8.1* 8.0*   Liver Function Tests: Recent Labs    06/13/20 0700 06/19/20 1248 07/11/20 0914 07/25/20 0000 07/30/20 0000  AST 19 24 24 19 17   ALT 13 14 19 14 13   ALKPHOS  --  55 62 63 66  BILITOT 0.6 0.6 0.8  --   --   PROT 6.6 6.9 6.8  --   --   ALBUMIN  --  3.7 3.9 3.9 4.0   No results for input(s): LIPASE, AMYLASE in the last 8760 hours. No results for input(s): AMMONIA in the last 8760  hours. CBC: Recent Labs    07/25/20 0000 07/30/20 0000 10/21/20 1212 11/01/20 0315 11/02/20 0351 11/12/20 0000  WBC 5.9 6.5 5.7 7.6 9.3 8.7  NEUTROABS 3,428 3,900  --   --   --  5,377.00  HGB 12.3 12.8 13.0 8.8* 8.6* 10.3*  HCT 37 37 39.0 26.4* 26.0* 30*  MCV  --   --  98.0 98.1 99.6  --   PLT 636* 519* 410* 289 266 838*   Cardiac Enzymes: No results for input(s): CKTOTAL, CKMB, CKMBINDEX, TROPONINI in the last 8760 hours. BNP: Invalid input(s): POCBNP CBG: Recent Labs    07/11/20 0904  GLUCAP 101*    Procedures and Imaging Studies During Stay: DG Pelvis Portable  Result Date: 10/31/2020 CLINICAL DATA:  Total left hip replacement. EXAM: PORTABLE PELVIS 1-2 VIEWS COMPARISON:  03/03/2020. FINDINGS: Total left hip replacement.  Hardware intact.  Anatomic alignment. IMPRESSION: Total left hip replacement with anatomic alignment. Electronically Signed   By: Marcello Moores  Register   On: 10/31/2020 13:39   DG C-Arm 1-60 Min-No Report  Result Date: 10/31/2020 CLINICAL DATA:  Status post left hip arthroplasty. EXAM: OPERATIVE left HIP (WITH PELVIS IF PERFORMED) 2 VIEWS TECHNIQUE: Fluoroscopic spot image(s) were submitted for interpretation post-operatively. Radiation exposure index: 1.9335 mGy. COMPARISON:  None. FINDINGS: Two intraoperative fluoroscopic images of the left hip demonstrate the left acetabular and femoral components to be well situated. IMPRESSION:  Status post left total hip arthroplasty. Electronically Signed   By: Marijo Conception M.D.   On: 10/31/2020 12:00   DG HIP OPERATIVE UNILAT W OR W/O PELVIS LEFT  Result Date: 10/31/2020 CLINICAL DATA:  Status post left hip arthroplasty. EXAM: OPERATIVE left HIP (WITH PELVIS IF PERFORMED) 2 VIEWS TECHNIQUE: Fluoroscopic spot image(s) were submitted for interpretation post-operatively. Radiation exposure index: 1.9335 mGy. COMPARISON:  None. FINDINGS: Two intraoperative fluoroscopic images of the left hip demonstrate the left acetabular and femoral components to be well situated. IMPRESSION: Status post left total hip arthroplasty. Electronically Signed   By: Marijo Conception M.D.   On: 10/31/2020 12:00    Assessment/Plan:    Seizure ([place]) Continue on Keprra S/P total left hip arthroplasty Doing well Pain controlled Still Partial Weight bearing but doing well. Pain Controlled Follow with Ortho On high dose of Aspirin for 28 days  HTN (hypertension), benign Off Norvasc Stable only on lasix Hypothyroidism, unspecified type TSH normal on 7/21  Hyponatremia Sodium stable  Insomnia secondary to anxiety Ambien and Unisom  Gastroesophageal reflux disease without esophagitis On Priolsec Age-related osteoporosis with current pathological fracture, sequela On Bonivia Bilateral leg edema Low dose of Lasix Anemia, unspecified type Hgb Better Iron discontinued per Patient request  Platelets elevated Due to Surgery Repeat CBC pending Hyperlipidemia She was started on Lipitor went Neurology thought  patient has had TIA But it was Seizures and  she was started on Keppra Patient wants her Lipitor to be discontinued at this time.  We will revisit    Patient is being discharged with the following home health services:  Therapy        Future labs/tests needed:

## 2020-11-19 LAB — CBC: RBC: 3.71 — AB (ref 3.87–5.11)

## 2020-11-19 LAB — CBC AND DIFFERENTIAL
HCT: 36 (ref 36–46)
Hemoglobin: 12.5 (ref 12.0–16.0)
Neutrophils Absolute: 3488
Platelets: 901 — AB (ref 150–399)
WBC: 6.8

## 2020-11-27 ENCOUNTER — Encounter: Payer: Self-pay | Admitting: Nurse Practitioner

## 2020-11-27 ENCOUNTER — Non-Acute Institutional Stay: Payer: Medicare Other | Admitting: Nurse Practitioner

## 2020-11-27 DIAGNOSIS — I10 Essential (primary) hypertension: Secondary | ICD-10-CM | POA: Diagnosis not present

## 2020-11-27 DIAGNOSIS — R569 Unspecified convulsions: Secondary | ICD-10-CM | POA: Diagnosis not present

## 2020-11-27 DIAGNOSIS — K5901 Slow transit constipation: Secondary | ICD-10-CM

## 2020-11-27 DIAGNOSIS — Z8781 Personal history of (healed) traumatic fracture: Secondary | ICD-10-CM

## 2020-11-27 DIAGNOSIS — I872 Venous insufficiency (chronic) (peripheral): Secondary | ICD-10-CM

## 2020-11-27 DIAGNOSIS — Z8673 Personal history of transient ischemic attack (TIA), and cerebral infarction without residual deficits: Secondary | ICD-10-CM

## 2020-11-27 DIAGNOSIS — M8000XS Age-related osteoporosis with current pathological fracture, unspecified site, sequela: Secondary | ICD-10-CM

## 2020-11-27 DIAGNOSIS — F419 Anxiety disorder, unspecified: Secondary | ICD-10-CM

## 2020-11-27 DIAGNOSIS — G2581 Restless legs syndrome: Secondary | ICD-10-CM | POA: Insufficient documentation

## 2020-11-27 DIAGNOSIS — E871 Hypo-osmolality and hyponatremia: Secondary | ICD-10-CM

## 2020-11-27 DIAGNOSIS — E039 Hypothyroidism, unspecified: Secondary | ICD-10-CM

## 2020-11-27 DIAGNOSIS — F5105 Insomnia due to other mental disorder: Secondary | ICD-10-CM

## 2020-11-27 DIAGNOSIS — K219 Gastro-esophageal reflux disease without esophagitis: Secondary | ICD-10-CM

## 2020-11-27 NOTE — Assessment & Plan Note (Signed)
Seizure, saw neurology 07/25/20, takes Keppra. Hospitalized 8/12-8/19 for seizure with presentation of left sided weakness again. EEG showed focal seizure right frontal region. Takes Keppra 500mg qd.   

## 2020-11-27 NOTE — Assessment & Plan Note (Signed)
Hypothyroidism, TSH wnl 05/2020, takes Levothyroxine 

## 2020-11-27 NOTE — Assessment & Plan Note (Signed)
Constipation, takes MiraLax, Senokot S, Colace  

## 2020-11-27 NOTE — Assessment & Plan Note (Signed)
OA, left hip, s/p total left hip arthroplasty,  takes Tylenol, prn Tramadol

## 2020-11-27 NOTE — Assessment & Plan Note (Signed)
Anxiety, not sleeping well, prn Lorazepam, Zolpidem  

## 2020-11-27 NOTE — Assessment & Plan Note (Signed)
OP takes Boniva, Ca, pending DEXA

## 2020-11-27 NOTE — Assessment & Plan Note (Signed)
Hyponatremia, Na 130 11/02/20

## 2020-11-27 NOTE — Assessment & Plan Note (Signed)
Edema BLE, takes Furosemide, Bun/creat 13/0.58 07/30/20

## 2020-11-27 NOTE — Assessment & Plan Note (Signed)
restless legs at night which keeps from night rest regardless Ambien or Lorazepam use. Failed Gabapentin in the past. Will try Requip 0.5mg  qhs.

## 2020-11-27 NOTE — Assessment & Plan Note (Signed)
GERD, desires prn Omeprazole. Hgb 10.3 11/12/20

## 2020-11-27 NOTE — Assessment & Plan Note (Signed)
TIA, off  Atorvastatin, ASA per patient's request.

## 2020-11-27 NOTE — Assessment & Plan Note (Signed)
HTN, takes Amlodipine, Furosemide, Bun/creat 16/0.42 11/02/20

## 2020-11-27 NOTE — Progress Notes (Signed)
Location:   [facility] Number: 906 Place of Service:  ALF [phone]) Provider:  Monigue Spraggins X, NP  [name], MD  Patient Care Team: [name], MD as PCP - General (Internal Medicine)  Extended Emergency Contact Information Primary Emergency Contact: Rodman Key Address: [address]          [place] [address] Johnnette Litter of [place] Phone: [phone] Mobile Phone: [phone] Relation: Son Secondary Emergency Contact: Milon Dikes [place] of [place] Phone: [phone] Mobile Phone: [phone] Relation: Friend  Code Status:  DNR Goals of care: Advanced Directive information Advanced Directives 11/27/2020  Does Patient Have a Medical Advance Directive? Yes  Type of Advance Directive Living will;Out of facility DNR (pink MOST or yellow form)  Does patient want to make changes to medical advance directive? No - Patient declined  Copy of [place] in Chart? -  Would patient like information on creating a medical advance directive? -  Pre-existing out of facility DNR order (yellow form or pink MOST form) Pink MOST form placed in chart (order not valid for inpatient use);Yellow form placed in chart (order not valid for inpatient use)     Chief Complaint  Patient presents with  . Acute Visit    Restless legs    HPI:  Pt is a 86 y.o. female seen today for an acute visit for restless legs at night which keeps from night rest regardless Ambien or Lorazepam use. Failed Gabapentin in the past. Also the patient desires change Omeprazole to prn since off ASA, Statin. OA desires prn Tramadol.    Edema BLE, takes Furosemide, Bun/creat 13/0.58 07/30/20 Seizure, saw neurology 07/25/20, takes Keppra. Hospitalized 8/12-8/19 for seizure with presentation of left sided weakness again. EEG showed focal seizure right frontal region. Takes Keppra 500mg  qd.  HTN, takes  Amlodipine, Furosemide, Bun/creat 16/0.42 11/02/20 Hypothyroidism, TSH wnl 05/2020, takes Levothyroxine Hyponatremia, Na 130 11/02/20 Anxiety, not sleeping well, prn Lorazepam, Zolpidem GERD, takes Omeprazole. Hgb 10.3 11/12/20 Constipation, takes MiraLax, Senokot S, Colace OA, left hip, s/p total left hip arthroplasty,  takes Tylenol, prn Tramadol             OP takes Boniva, Ca, pending DEXA             TIA, off  Atorvastatin, ASA per patient's request.    Past Medical History:  Diagnosis Date  . Abdominal pain 02/26/2017  . Anxiety   . Arthritis    Left Knee, Wrist, Back and Hips  . Cervical vertebral fusion   . Diverticulitis   . Diverticulosis   . Fibromyalgia   . GERD (gastroesophageal reflux disease)   . Gout 02/2018   L hand  . Hypertension     after d/c from hospital no current problems or medication  . Hypothyroidism   . Pelvis fracture ([facility])   . Peritoneal free air 03/29/2012  . Pneumonia   . Pneumoperitoneum 02/26/2017  . Pre-diabetes   . Seizure ([place])   . Thyroid disorder   . Toe pain 09/06/2016   Past Surgical History:  Procedure Laterality Date  . ABDOMINAL HYSTERECTOMY    . BOWEL RESECTION N/A 07/09/2017   Procedure: SMALL BOWEL RESECTION;  Surgeon: Johnathan Hausen, MD;  Location: [facility];  Service: General;  Laterality: N/A;  . CERVICAL FUSION     x2  . CONVERSION TO TOTAL HIP Left 10/31/2020  Procedure: CONVERSION TO ANTERIOR TOTAL HIP;  Surgeon: Durene Romans, MD;  Location: [facility];  Service: Orthopedics;  Laterality: Left;  2 hrs  . FEMUR IM NAIL Left 03/03/2020   Procedure: INTRAMEDULLARY (IM) NAIL FEMORAL;  Surgeon: Durene Romans, MD;  Location: [facility];  Service: Orthopedics;  Laterality: Left;  . LAPAROSCOPIC APPENDECTOMY N/A 03/04/2017   Procedure: APPENDECTOMY LAPAROSCOPIC;  Surgeon: Luretha Murphy, MD;  Location: [facility];  Service: General;  Laterality: N/A;  . LAPAROSCOPY N/A  03/04/2017   Procedure: LAPAROSCOPY DIAGNOSTIC, ENTEROLYSIS;  Surgeon: Luretha Murphy, MD;  Location: [facility];  Service: General;  Laterality: N/A;  . LAPAROTOMY N/A 07/09/2017   Procedure: EXPLORATORY LAPAROTOMY;  Surgeon: Luretha Murphy, MD;  Location: [facility];  Service: General;  Laterality: N/A;  . SPINE SURGERY     Lumbar- rod placement  . TONSILLECTOMY     86y/o  . TOTAL VAGINAL HYSTERECTOMY      Allergies  Allergen Reactions  . Codeine Rash  . Penicillins Rash    Has patient had a PCN reaction causing immediate rash, facial/tongue/throat swelling, SOB or lightheadedness with hypotension: No Has patient had a PCN reaction causing severe rash involving mucus membranes or skin necrosis: No Has patient had a PCN reaction that required hospitalization: No Has patient had a PCN reaction occurring within the last 10 years: No If all of the above answers are "NO", then may proceed with Cephalosporin use.  Tolerated Cephalosporin 10/31/20    . Levaquin [Levofloxacin]     Allergies as of 11/27/2020      Reactions   Codeine Rash   Penicillins Rash   Has patient had a PCN reaction causing immediate rash, facial/tongue/throat swelling, SOB or lightheadedness with hypotension: No Has patient had a PCN reaction causing severe rash involving mucus membranes or skin necrosis: No Has patient had a PCN reaction that required hospitalization: No Has patient had a PCN reaction occurring within the last 10 years: No If all of the above answers are "NO", then may proceed with Cephalosporin use. Tolerated Cephalosporin 10/31/20   Levaquin [levofloxacin]       Medication List       Accurate as of November 27, 2020 11:59 PM. If you have any questions, ask your nurse or doctor.        aspirin 81 MG chewable tablet Chew 1 tablet (81 mg total) by mouth 2 (two) times daily for 28 days. Then resume normal dose of aspirin 81 mg daily.   CALCIUM 500 PO Take 500 mg by mouth daily.   doxylamine  (Sleep) 25 MG tablet Commonly known as: UNISOM Take 25 mg by mouth at bedtime as needed.   furosemide 20 MG tablet Commonly known as: LASIX Take 20 mg by mouth every Monday, Wednesday, and Friday.   HYDROcodone-acetaminophen 5-325 MG tablet Commonly known as: NORCO/VICODIN Take 1-2 tablets by mouth every 4 (four) hours as needed for severe pain.   ibandronate 150 MG tablet Commonly known as: BONIVA Take 150 mg by mouth every 30 (thirty) days. Take in the morning with a full glass of water, on an empty stomach, and do not take anything else by mouth or lie down for the next 30 min.   levETIRAcetam 500 MG tablet Commonly known as: KEPPRA Take 500 mg by mouth daily.   levothyroxine 75 MCG tablet Commonly known as: SYNTHROID Take 1 tablet (75 mcg total) by mouth daily before breakfast.   LORazepam 0.5 MG tablet Commonly known as: Ativan Take 0.5 tablets (0.25  mg total) by mouth 2 (two) times daily as needed for anxiety.   methocarbamol 500 MG tablet Commonly known as: ROBAXIN Take 250 mg by mouth every 6 (six) hours as needed for muscle spasms.   omeprazole 20 MG capsule Commonly known as: PRILOSEC Take 20 mg by mouth daily as needed (indigestion.).   polyethylene glycol 17 g packet Commonly known as: MIRALAX / GLYCOLAX Take 17 g by mouth daily.   potassium chloride 10 MEQ CR capsule Commonly known as: MICRO-K Take 10 mEq by mouth every Monday, Wednesday, and Friday.   senna-docusate 8.6-50 MG tablet Commonly known as: Senokot-S Take 2 tablets by mouth 2 (two) times daily. What changed: how much to take   vitamin D3 50 MCG (2000 UT) Caps Take 2,000 Units by mouth daily.   zolpidem 5 MG tablet Commonly known as: AMBIEN Take 0.5 tablets (2.5 mg total) by mouth at bedtime as needed for sleep.       Review of Systems  Constitutional: Negative for activity change, fatigue and fever.  HENT: Negative for congestion and voice change.   Eyes: Negative for visual  disturbance.  Respiratory: Negative for cough and shortness of breath.        Chronic cough  Cardiovascular: Positive for leg swelling. Negative for palpitations.  Gastrointestinal: Negative for abdominal pain and constipation.  Genitourinary: Negative for dysuria and urgency.       Bathroom trips 1-2/night  Musculoskeletal: Positive for arthralgias and gait problem. Negative for back pain.       Left hip pain, s/p total left hip arthroplasty.   Skin: Negative for color change.  Neurological: Negative for seizures, speech difficulty, weakness and headaches.       Restless legs at night.   Psychiatric/Behavioral: Positive for sleep disturbance. Negative for behavioral problems and hallucinations. The patient is not nervous/anxious.     Immunization History  Administered Date(s) Administered  . Influenza-Unspecified 09/01/2018, 09/10/2020  . Moderna Sars-Covid-2 Vaccination 12/04/2019, 01/01/2020  . Zoster Recombinat (Shingrix) 07/05/2018   Pertinent  Health Maintenance Due  Topic Date Due  . DEXA SCAN  Never done  . PNA vac Low Risk Adult (1 of 2 - PCV13) Never done  . INFLUENZA VACCINE  Completed   Fall Risk  06/14/2020 04/26/2020  Falls in the past year? 0 1  Number falls in past yr: 0 0  Injury with Fall? - 1   Functional Status Survey:    Vitals:   11/27/20 1150  BP: (!) 112/58  Pulse: 60  Resp: 16  Temp: (!) 97.5 F (36.4 C)  SpO2: 93%  Weight: 96 lb 3.2 oz (43.6 kg)  Height: 5' (1.524 m)   Body mass index is 18.79 kg/m. Physical Exam Constitutional:      Appearance: Normal appearance.  HENT:     Head: Normocephalic and atraumatic.     Mouth/Throat:     Mouth: Mucous membranes are moist.  Eyes:     Conjunctiva/sclera: Conjunctivae normal.     Pupils: Pupils are equal, round, and reactive to light.  Cardiovascular:     Rate and Rhythm: Normal rate and regular rhythm.     Heart sounds: No murmur heard.     Comments: Weak left DP pulse, more symptomatic  tingling sensation at night.  Pulmonary:     Effort: Pulmonary effort is normal.     Breath sounds: No rales.  Abdominal:     General: Bowel sounds are normal.     Palpations: Abdomen is soft.  Tenderness: There is no abdominal tenderness.  Musculoskeletal:     Cervical back: Normal range of motion and neck supple.     Right lower leg: Edema present.     Left lower leg: Edema present.     Comments: Trace edema BLE,  mild scoliosis.   Skin:    General: Skin is warm and dry.  Neurological:     General: No focal deficit present.     Mental Status: She is alert and oriented to person, place, and time. Mental status is at baseline.     Gait: Gait abnormal.     Comments: Using walker sometimes.   Psychiatric:        Mood and Affect: Mood normal.        Behavior: Behavior normal.        Thought Content: Thought content normal.        Judgment: Judgment normal.     Labs reviewed: Recent Labs    10/21/20 1212 11/01/20 0315 11/02/20 0351  NA 133* 133* 130*  K 5.6* 4.1 4.1  CL 97* 103 99  CO2 28 23 23   GLUCOSE 92 132* 105*  BUN 23 13 16   CREATININE 0.71 0.49 0.42*  CALCIUM 9.8 8.1* 8.0*   Recent Labs    06/13/20 0700 06/19/20 1248 07/11/20 0914 07/25/20 0000 07/30/20 0000  AST 19 24 24 19 17   ALT 13 14 19 14 13   ALKPHOS  --  55 62 63 66  BILITOT 0.6 0.6 0.8  --   --   PROT 6.6 6.9 6.8  --   --   ALBUMIN  --  3.7 3.9 3.9 4.0   Recent Labs    07/25/20 0000 07/30/20 0000 10/21/20 1212 11/01/20 0315 11/02/20 0351 11/12/20 0000  WBC 5.9 6.5 5.7 7.6 9.3 8.7  NEUTROABS 3,428 3,900  --   --   --  5,377.00  HGB 12.3 12.8 13.0 8.8* 8.6* 10.3*  HCT 37 37 39.0 26.4* 26.0* 30*  MCV  --   --  98.0 98.1 99.6  --   PLT 636* 519* 410* 289 266 838*   Lab Results  Component Value Date   TSH 2.389 06/19/2020   Lab Results  Component Value Date   HGBA1C 5.8 (H) 10/21/2020   Lab Results  Component Value Date   CHOL 171 06/20/2020   HDL 59 06/20/2020   LDLCALC 94  06/20/2020   TRIG 89 06/20/2020   CHOLHDL 2.9 06/20/2020    Significant Diagnostic Results in last 30 days:  DG Pelvis Portable  Result Date: 10/31/2020 CLINICAL DATA:  Total left hip replacement. EXAM: PORTABLE PELVIS 1-2 VIEWS COMPARISON:  03/03/2020. FINDINGS: Total left hip replacement.  Hardware intact.  Anatomic alignment. IMPRESSION: Total left hip replacement with anatomic alignment. Electronically Signed   By: 06/22/2020  Register   On: 10/31/2020 13:39   DG C-Arm 1-60 Min-No Report  Result Date: 10/31/2020 CLINICAL DATA:  Status post left hip arthroplasty. EXAM: OPERATIVE left HIP (WITH PELVIS IF PERFORMED) 2 VIEWS TECHNIQUE: Fluoroscopic spot image(s) were submitted for interpretation post-operatively. Radiation exposure index: 1.9335 mGy. COMPARISON:  None. FINDINGS: Two intraoperative fluoroscopic images of the left hip demonstrate the left acetabular and femoral components to be well situated. IMPRESSION: Status post left total hip arthroplasty. Electronically Signed   By: 05/03/2020 M.D.   On: 10/31/2020 12:00   DG HIP OPERATIVE UNILAT W OR W/O PELVIS LEFT  Result Date: 10/31/2020 CLINICAL DATA:  Status post left hip arthroplasty.  EXAM: OPERATIVE left HIP (WITH PELVIS IF PERFORMED) 2 VIEWS TECHNIQUE: Fluoroscopic spot image(s) were submitted for interpretation post-operatively. Radiation exposure index: 1.9335 mGy. COMPARISON:  None. FINDINGS: Two intraoperative fluoroscopic images of the left hip demonstrate the left acetabular and femoral components to be well situated. IMPRESSION: Status post left total hip arthroplasty. Electronically Signed   By: Lupita Raider M.D.   On: 10/31/2020 12:00    Assessment/Plan Restless leg syndrome restless legs at night which keeps from night rest regardless Ambien or Lorazepam use. Failed Gabapentin in the past. Will try Requip 0.5mg  qhs.   Venous insufficiency Edema BLE, takes Furosemide, Bun/creat 13/0.58 07/30/20   Seizure  ([facility]) Seizure, saw neurology 07/25/20, takes Keppra. Hospitalized 8/12-8/19 for seizure with presentation of left sided weakness again. EEG showed focal seizure right frontal region. Takes Keppra 500mg  qd.   HTN (hypertension), benign HTN, takes Amlodipine, Furosemide, Bun/creat 16/0.42 11/02/20   Hypothyroidism Hypothyroidism, TSH wnl 05/2020, takes Levothyroxine  Hyponatremia Hyponatremia, Na 130 11/02/20   Insomnia secondary to anxiety Anxiety, not sleeping well, prn Lorazepam, Zolpidem   GERD (gastroesophageal reflux disease) GERD, desires prn Omeprazole. Hgb 10.3 11/12/20  Slow transit constipation Constipation, takes MiraLax, Senokot S, Colace  History of fracture of left hip OA, left hip, s/p total left hip arthroplasty,  takes Tylenol, prn Tramadol   Osteoporosis OP takes Boniva, Ca, pending DEXA  History of TIA (transient ischemic attack) TIA, off  Atorvastatin, ASA per patient's request.        Family/ staff Communication: plan of care reviewed with the patient and charge nurse.   Labs/tests ordered:  none  Time spend 40 minutes.

## 2020-11-28 ENCOUNTER — Encounter: Payer: Self-pay | Admitting: Nurse Practitioner

## 2020-12-23 ENCOUNTER — Encounter: Payer: Self-pay | Admitting: Internal Medicine

## 2020-12-23 ENCOUNTER — Non-Acute Institutional Stay: Payer: Medicare Other | Admitting: Internal Medicine

## 2020-12-23 DIAGNOSIS — I10 Essential (primary) hypertension: Secondary | ICD-10-CM | POA: Diagnosis not present

## 2020-12-23 DIAGNOSIS — R569 Unspecified convulsions: Secondary | ICD-10-CM

## 2020-12-23 DIAGNOSIS — G2581 Restless legs syndrome: Secondary | ICD-10-CM

## 2020-12-23 DIAGNOSIS — F5105 Insomnia due to other mental disorder: Secondary | ICD-10-CM

## 2020-12-23 DIAGNOSIS — E039 Hypothyroidism, unspecified: Secondary | ICD-10-CM

## 2020-12-23 DIAGNOSIS — E871 Hypo-osmolality and hyponatremia: Secondary | ICD-10-CM

## 2020-12-23 DIAGNOSIS — F419 Anxiety disorder, unspecified: Secondary | ICD-10-CM

## 2020-12-23 DIAGNOSIS — K219 Gastro-esophageal reflux disease without esophagitis: Secondary | ICD-10-CM

## 2020-12-23 NOTE — Progress Notes (Signed)
Location:    [facility] Room Number: [place] of Service:  ALF [phone]) Provider:  Veleta Miners MD  [name], MD  Patient Care Team: [name], MD as PCP - General (Internal Medicine)  Extended Emergency Contact Information Primary Emergency Contact: Rodman Key Address: [address]          [place] [address] Johnnette Litter of [place] Phone: [phone] Mobile Phone: [phone] Relation: Son Secondary Emergency Contact: Milon Dikes [place] of [place] Phone: [phone] Mobile Phone: [phone] Relation: Friend  Code Status:  DNR Goals of care: Advanced Directive information Advanced Directives 11/27/2020  Does Patient Have a Medical Advance Directive? Yes  Type of Advance Directive Living will;Out of facility DNR (pink MOST or yellow form)  Does patient want to make changes to medical advance directive? No - Patient declined  Copy of [place] in Chart? -  Would patient like information on creating a medical advance directive? -  Pre-existing out of facility DNR order (yellow form or pink MOST form) Pink MOST form placed in chart (order not valid for inpatient use);Yellow form placed in chart (order not valid for inpatient use)     Chief Complaint  Patient presents with  . Medical Management of Chronic Issues  . Health Maintenance    TDAP, PCV13, DEXA    HPI:  Pt is a 87 y.o. female seen today for medical management of chronic diseases.    She also has h/o hypothyroidism,Osteoporosis, Arthritis and GERDand Depression and Anxiety also Insomnia. And Recent h/o Seizures with Left sided weakness  Underwent left total hip arthroplasty with anterior approach on12/02  Is in [place] with her walker Says that balance is still little off so working with therapy Also has noticed some pain in Right Groin area. Is going to talk to Dr Alvan Dame Restless leg is better on  Requip Swelling controlled No Other issues Past Medical History:  Diagnosis Date  . Abdominal pain 02/26/2017  . Anxiety   . Arthritis    Left Knee, Wrist, Back and Hips  . Cervical vertebral fusion   . Diverticulitis   . Diverticulosis   . Fibromyalgia   . GERD (gastroesophageal reflux disease)   . Gout 02/2018   L hand  . Hypertension     after d/c from hospital no current problems or medication  . Hypothyroidism   . Pelvis fracture ([place])   . Peritoneal free air 03/29/2012  . Pneumonia   . Pneumoperitoneum 02/26/2017  . Pre-diabetes   . Seizure ([place])   . Thyroid disorder   . Toe pain 09/06/2016   Past Surgical History:  Procedure Laterality Date  . ABDOMINAL HYSTERECTOMY    . BOWEL RESECTION N/A 07/09/2017   Procedure: SMALL BOWEL RESECTION;  Surgeon: Johnathan Hausen, MD;  Location: [facility];  Service: General;  Laterality: N/A;  . CERVICAL FUSION     x2  . CONVERSION TO TOTAL HIP Left 10/31/2020   Procedure: CONVERSION TO ANTERIOR TOTAL HIP;  Surgeon: Paralee Cancel, MD;  Location: [facility];  Service: Orthopedics;  Laterality: Left;  2 hrs  . FEMUR IM NAIL Left 03/03/2020   Procedure: INTRAMEDULLARY (IM) NAIL FEMORAL;  Surgeon: Paralee Cancel, MD;  Location: [facility];  Service: Orthopedics;  Laterality: Left;  . LAPAROSCOPIC APPENDECTOMY N/A 03/04/2017   Procedure: APPENDECTOMY LAPAROSCOPIC;  Surgeon: Johnathan Hausen, MD;  Location: [facility];  Service:  General;  Laterality: N/A;  . LAPAROSCOPY N/A 03/04/2017   Procedure: LAPAROSCOPY DIAGNOSTIC, ENTEROLYSIS;  Surgeon: Johnathan Hausen, MD;  Location: [facility];  Service: General;  Laterality: N/A;  . LAPAROTOMY N/A 07/09/2017   Procedure: EXPLORATORY LAPAROTOMY;  Surgeon: Johnathan Hausen, MD;  Location: [facility];  Service: General;  Laterality: N/A;  . SPINE SURGERY     Lumbar- rod placement  . TONSILLECTOMY     87y/o  . TOTAL VAGINAL HYSTERECTOMY      Allergies  Allergen Reactions  . Codeine Rash  . Penicillins Rash    Has patient had a  PCN reaction causing immediate rash, facial/tongue/throat swelling, SOB or lightheadedness with hypotension: No Has patient had a PCN reaction causing severe rash involving mucus membranes or skin necrosis: No Has patient had a PCN reaction that required hospitalization: No Has patient had a PCN reaction occurring within the last 10 years: No If all of the above answers are "NO", then may proceed with Cephalosporin use.  Tolerated Cephalosporin 10/31/20    . Levaquin [Levofloxacin]     Allergies as of 12/23/2020      Reactions   Codeine Rash   Penicillins Rash   Has patient had a PCN reaction causing immediate rash, facial/tongue/throat swelling, SOB or lightheadedness with hypotension: No Has patient had a PCN reaction causing severe rash involving mucus membranes or skin necrosis: No Has patient had a PCN reaction that required hospitalization: No Has patient had a PCN reaction occurring within the last 10 years: No If all of the above answers are "NO", then may proceed with Cephalosporin use. Tolerated Cephalosporin 10/31/20   Levaquin [levofloxacin]       Medication List       Accurate as of December 23, 2020 11:20 AM. If you have any questions, ask your nurse or doctor.        acetaminophen 500 MG tablet Commonly known as: TYLENOL Take 1,000 mg by mouth at bedtime.   CALCIUM 500 PO Take 500 mg by mouth daily.   doxylamine (Sleep) 25 MG tablet Commonly known as: UNISOM Take 25 mg by mouth at bedtime as needed.   furosemide 20 MG tablet Commonly known as: LASIX Take 20 mg by mouth every Monday, Wednesday, and Friday.   HYDROcodone-acetaminophen 5-325 MG tablet Commonly known as: NORCO/VICODIN Take 1-2 tablets by mouth every 4 (four) hours as needed for severe pain.   ibandronate 150 MG tablet Commonly known as: BONIVA Take 150 mg by mouth every 30 (thirty) days. Take in the morning with a full glass of water, on an empty stomach, and do not take anything else by  mouth or lie down for the next 30 min.   levETIRAcetam 500 MG tablet Commonly known as: KEPPRA Take 500 mg by mouth daily.   levothyroxine 75 MCG tablet Commonly known as: SYNTHROID Take 1 tablet (75 mcg total) by mouth daily before breakfast.   LORazepam 0.5 MG tablet Commonly known as: Ativan Take 0.5 tablets (0.25 mg total) by mouth 2 (two) times daily as needed for anxiety.   methocarbamol 500 MG tablet Commonly known as: ROBAXIN Take 250 mg by mouth every 6 (six) hours as needed for muscle spasms.   omeprazole 20 MG capsule Commonly known as: PRILOSEC Take 20 mg by mouth daily as needed (indigestion.).   polyethylene glycol 17 g packet Commonly known as: MIRALAX / GLYCOLAX Take 17 g by mouth daily.   potassium chloride 10 MEQ CR capsule Commonly known as: MICRO-K Take 10 mEq  by mouth every Monday, Wednesday, and Friday.   rOPINIRole 0.5 MG tablet Commonly known as: REQUIP Take 0.5 mg by mouth at bedtime.   senna-docusate 8.6-50 MG tablet Commonly known as: Senokot-S Take 2 tablets by mouth 2 (two) times daily. What changed: how much to take   traMADol 50 MG tablet Commonly known as: ULTRAM Take by mouth every 6 (six) hours as needed.   vitamin D3 50 MCG (2000 UT) Caps Take 2,000 Units by mouth daily.   zolpidem 5 MG tablet Commonly known as: AMBIEN Take 5 mg by mouth at bedtime as needed for sleep. What changed: Another medication with the same name was removed. Continue taking this medication, and follow the directions you see here. Changed by: [name], MD       Review of Systems  Review of Systems  Constitutional: Negative for activity change, appetite change, chills, diaphoresis, fatigue and fever.  HENT: Negative for mouth sores, postnasal drip, rhinorrhea, sinus pain and sore throat.   Respiratory: Negative for apnea, cough, chest tightness, shortness of breath and wheezing.   Cardiovascular: Negative for chest pain, palpitations and leg  swelling.  Gastrointestinal: Negative for abdominal distention, abdominal pain, constipation, diarrhea, nausea and vomiting.  Genitourinary: Negative for dysuria and frequency.  Musculoskeletal: Negative for arthralgias, joint swelling and myalgias.  Skin: Negative for rash.  Neurological: Negative for dizziness, syncope, weakness, light-headedness and numbness.  Psychiatric/Behavioral: Negative for behavioral problems, confusion and sleep disturbance.     Immunization History  Administered Date(s) Administered  . Influenza-Unspecified 09/01/2018, 09/10/2020  . Moderna Sars-Covid-2 Vaccination 12/04/2019, 01/01/2020  . Zoster Recombinat (Shingrix) 07/05/2018   Pertinent  Health Maintenance Due  Topic Date Due  . DEXA SCAN  Never done  . PNA vac Low Risk Adult (1 of 2 - PCV13) Never done  . INFLUENZA VACCINE  Completed   Fall Risk  06/14/2020 04/26/2020  Falls in the past year? 0 1  Number falls in past yr: 0 0  Injury with Fall? - 1   Functional Status Survey:    Vitals:   12/23/20 1058  BP: 126/70  Pulse: 64  Resp: 20  Temp: 98.8 F (37.1 C)  SpO2: 95%  Weight: 96 lb 3.2 oz (43.6 kg)  Height: 5' (1.524 m)   Body mass index is 18.79 kg/m. Physical Exam  Constitutional: Oriented to person, place, and time. Well-developed and well-nourished.  HENT:  Head: Normocephalic.  Mouth/Throat: Oropharynx is clear and moist.  Eyes: Pupils are equal, round, and reactive to light.  Neck: Neck supple.  Cardiovascular: Normal rate and normal heart sounds.  No murmur heard. Pulmonary/Chest: Effort normal and breath sounds normal. No respiratory distress. No wheezes. She has no rales.  Abdominal: Soft. Bowel sounds are normal. No distension. There is no tenderness. There is no rebound.  Musculoskeletal: Mild  edema.  Lymphadenopathy: none Neurological: Alert and oriented to person, place, and time.  Skin: Skin is warm and dry.  Psychiatric: Normal mood and affect. Behavior is  normal. Thought content normal.    Labs reviewed: Recent Labs    10/21/20 1212 11/01/20 0315 11/02/20 0351  NA 133* 133* 130*  K 5.6* 4.1 4.1  CL 97* 103 99  CO2 28 23 23   GLUCOSE 92 132* 105*  BUN 23 13 16   CREATININE 0.71 0.49 0.42*  CALCIUM 9.8 8.1* 8.0*   Recent Labs    06/13/20 0700 06/19/20 1248 07/11/20 0914 07/25/20 0000 07/30/20 0000  AST 19 24 24 19  17  ALT 13 14 19 14 13   ALKPHOS  --  55 62 63 66  BILITOT 0.6 0.6 0.8  --   --   PROT 6.6 6.9 6.8  --   --   ALBUMIN  --  3.7 3.9 3.9 4.0   Recent Labs    07/30/20 0000 10/21/20 1212 11/01/20 0315 11/02/20 0351 11/12/20 0000 11/19/20 0000  WBC 6.5 5.7 7.6 9.3 8.7 6.8  NEUTROABS 3,900  --   --   --  5,377.00 3,488.00  HGB 12.8 13.0 8.8* 8.6* 10.3* 12.5  HCT 37 39.0 26.4* 26.0* 30* 36  MCV  --  98.0 98.1 99.6  --   --   PLT 519* 410* 289 266 838* 901*   Lab Results  Component Value Date   TSH 2.389 06/19/2020   Lab Results  Component Value Date   HGBA1C 5.8 (H) 10/21/2020   Lab Results  Component Value Date   CHOL 171 06/20/2020   HDL 59 06/20/2020   LDLCALC 94 06/20/2020   TRIG 89 06/20/2020   CHOLHDL 2.9 06/20/2020    Significant Diagnostic Results in last 30 days:  No results found.  Assessment/Plan Seizure ([place]) Stable on Keprra  Restless leg syndrome Doing well on Requip  HTN (hypertension), benign On Lasix only  Hypothyroidism, unspecified type Repeat TSH  Hyponatremia Repeat CMP  Insomnia secondary to anxiety On Ativan  Gastroesophageal reflux disease without esophagitis On Prilosec prn  Hyperlipidemia She was started on LipitorwentNeurology thoughtpatient has had TIA But it wasSeizuresandshe was started on Keppra She wanted it to stop which was last month Will repeat Lipid panel  LE edema On lasix low dose Repeat CMP  S/p Arthroplasty left hip Pain contorol Discontinue robaxin and tramadol  Thrombocythemia Repeat CBC  Family/ staff  Communication:   Labs/tests ordered:  CBC,CMP,TSH, Lipid panel

## 2021-01-08 ENCOUNTER — Encounter: Payer: Self-pay | Admitting: Nurse Practitioner

## 2021-01-08 DIAGNOSIS — E785 Hyperlipidemia, unspecified: Secondary | ICD-10-CM | POA: Insufficient documentation

## 2021-03-03 ENCOUNTER — Non-Acute Institutional Stay: Payer: Medicare Other | Admitting: Nurse Practitioner

## 2021-03-03 ENCOUNTER — Ambulatory Visit (INDEPENDENT_AMBULATORY_CARE_PROVIDER_SITE_OTHER): Payer: Medicare Other | Admitting: Adult Health

## 2021-03-03 ENCOUNTER — Encounter: Payer: Self-pay | Admitting: Adult Health

## 2021-03-03 VITALS — BP 140/83 | HR 81 | Ht 60.0 in | Wt 101.0 lb

## 2021-03-03 DIAGNOSIS — M8949 Other hypertrophic osteoarthropathy, multiple sites: Secondary | ICD-10-CM

## 2021-03-03 DIAGNOSIS — I10 Essential (primary) hypertension: Secondary | ICD-10-CM

## 2021-03-03 DIAGNOSIS — R569 Unspecified convulsions: Secondary | ICD-10-CM

## 2021-03-03 DIAGNOSIS — G629 Polyneuropathy, unspecified: Secondary | ICD-10-CM

## 2021-03-03 DIAGNOSIS — F419 Anxiety disorder, unspecified: Secondary | ICD-10-CM

## 2021-03-03 DIAGNOSIS — G5793 Unspecified mononeuropathy of bilateral lower limbs: Secondary | ICD-10-CM | POA: Diagnosis not present

## 2021-03-03 DIAGNOSIS — M159 Polyosteoarthritis, unspecified: Secondary | ICD-10-CM | POA: Insufficient documentation

## 2021-03-03 DIAGNOSIS — F5105 Insomnia due to other mental disorder: Secondary | ICD-10-CM

## 2021-03-03 DIAGNOSIS — G459 Transient cerebral ischemic attack, unspecified: Secondary | ICD-10-CM

## 2021-03-03 DIAGNOSIS — K5901 Slow transit constipation: Secondary | ICD-10-CM

## 2021-03-03 DIAGNOSIS — I872 Venous insufficiency (chronic) (peripheral): Secondary | ICD-10-CM

## 2021-03-03 DIAGNOSIS — E871 Hypo-osmolality and hyponatremia: Secondary | ICD-10-CM

## 2021-03-03 DIAGNOSIS — M8000XS Age-related osteoporosis with current pathological fracture, unspecified site, sequela: Secondary | ICD-10-CM

## 2021-03-03 DIAGNOSIS — E039 Hypothyroidism, unspecified: Secondary | ICD-10-CM

## 2021-03-03 DIAGNOSIS — G2581 Restless legs syndrome: Secondary | ICD-10-CM

## 2021-03-03 DIAGNOSIS — K219 Gastro-esophageal reflux disease without esophagitis: Secondary | ICD-10-CM

## 2021-03-03 MED ORDER — GABAPENTIN 300 MG PO CAPS: 300.0000 mg | ORAL_CAPSULE | Freq: Every day | ORAL | 3 refills | Status: DC

## 2021-03-03 NOTE — Assessment & Plan Note (Signed)
03/03/21 neurology stopped Requip, re-started Gabapentin.

## 2021-03-03 NOTE — Assessment & Plan Note (Addendum)
takes Amlodipine, Furosemide, Bun/creat 20/0.59 01/07/21

## 2021-03-03 NOTE — Assessment & Plan Note (Signed)
takes MiraLax, Senokot S, Colace

## 2021-03-03 NOTE — Progress Notes (Signed)
[facility] [address] [place]. [place] [address] [phone]       STROKE FOLLOW UP NOTE  Sara. Sara Davila Date of Birth:  06-Dec-1929 Medical Record Number:  [id number]   Reason for Referral: stroke follow up    SUBJECTIVE:   CHIEF COMPLAINT:  Chief Complaint  Patient presents with  . Follow-up    RM 14 accompanied Pt is well, no seizures/stroke symptoms     HPI:   Today, 03/03/2021, Sara. Shepler returns for stroke and seizure follow-up after prior visit approximately 8 months ago.  She is unaccompanied at today's visit  Stable since prior visit without new or recurring stroke/TIA symptoms Personally reviewed [facility] notes -aspirin and atorvastatin d/c'd per patient request as she had difficulty tolerating and episodes were not felt to be TIA or stroke and more so seizures  Blood pressure today 140/83  Denies any additional seizure activity Currently on Keppra XR 500 mg nightly tolerating without side effects Difficulty tolerating higher dosages w/ c/o hallucinations and fatigue  S/p L conversion to anterior total hip 10/2020 by Dr. Alvan Dame without complication  She also mentions RLS currently on Requip without much benefit -describes symptoms as her feet are constantly moving (toe tapping like movement) but denies any pain or a feeling as though she needs to move her legs; does not improve with ambulation or movement Also reports chronic history of neuropathy in BLE first starting in her R big toe and then eventually L big toe -currently neuropathy in toes and bottom of feet -describes burning type sensation especially at night interfering with sleep  No further concerns at this time    History provided for reference purposes only Initial visit 07/25/2020 JM: Sara Davila is being seen for hospital follow-up accompanied by transportation [facility]  Since discharge, she presented to ED on 07/11/2020 with  recurrence of left-sided weakness, left hemianopia along with severe frontal headache associated with photophobia and nausea the morning 4 mg dose home prior.  Symptoms apparently resolved after 20 minutes.  Neurology consulted who noted altered mental status with EEG reporting evidence of epileptogenicity arising from the right frontal region as well as cortical dysfunction in the right hemisphere likely secondary to underlying structural abnormality, postictal state.  MRI negative for acute abnormality.  Initiated Keppra 750 mg twice daily for new onset seizures.  Noted to be weak and deconditioned with PT recommending CIR but unfortunately insurance denied and discharged back to ALF with [facility] services.  She has been doing well since discharge without reoccurring left-sided weakness, visual changes or headache.  She does report concerns of visual hallucinations in her left periphery but when she would look straight on to image, she would no longer see image.  She denies visual impairment or deficit.  There was concern for keppra side effect from SNF provider therefore dosage lowered to 500 mg twice daily with improvement of her hallucinations.  She denies any other side effect or intolerance  She has remained on aspirin and atorvastatin for secondary stroke prevention without side effects Blood pressure today 110/69 HTN and HLD managed by facility  No further concerns at this time  Hospital admission 06/19/2020 Sara. ISOLA Davila is a 87 y.o. female with history of TIA and HTN who presented on 06/19/2020 with L sided weakness.   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  Recommended DAPT for questions  weeks and aspirin alone.  HTN stable.  LDL 94 initiate atorvastatin 20 mg daily.  No history of DM but A1c 6.1 indicating prediabetes.  Other stroke risk factors include advanced age, former tobacco use, EtOH use, hx TIA per pt, and  family history of stroke.  Evaluated by therapy without therapy needs and discharged back home with plans on moving to ALF the following day.  R brain TIA likely from small vessel disease CT head No acute abnormality. Small vessel disease. Atrophy.   CTA head & neck no ELVO. L ICA bifurcation w/ minimal atherosclerosis. B cervical ICA tortuosity. Grade 2 anterolisthesis C7-11, CS fused above this level  MRI  No acute abnormality  2D Echo  normal  LDL 94  HgbA1c 6.1  VTE prophylaxis - Lovenox 30 mg sq daily   No antithrombotic prior to admission, now on aspirin 325 mg daily. Decrease aspirin to 81 and add plavix 75 mg daily. Continue DAPT x 3 weeks then aspirin alone.   Therapy recommendations:  ALF   Disposition:  Return to [facility] - from [facility] apt w/ plans to move to ALF tomorrow at [place]:   14 system review of systems performed and negative with exception of those listed in HPI  PMH:  Past Medical History:  Diagnosis Date  . Abdominal pain 02/26/2017  . Anxiety   . Arthritis    Left Knee, Wrist, Back and Hips  . Cervical vertebral fusion   . Diverticulitis   . Diverticulosis   . Fibromyalgia   . GERD (gastroesophageal reflux disease)   . Gout 02/2018   L hand  . Hypertension     after d/c from hospital no current problems or medication  . Hypothyroidism   . Pelvis fracture ([place])   . Peritoneal free air 03/29/2012  . Pneumonia   . Pneumoperitoneum 02/26/2017  . Pre-diabetes   . Seizure ([place])   . Thyroid disorder   . Toe pain 09/06/2016    PSH:  Past Surgical History:  Procedure Laterality Date  . ABDOMINAL HYSTERECTOMY    . BOWEL RESECTION N/A 07/09/2017   Procedure: SMALL BOWEL RESECTION;  Surgeon: Johnathan Hausen, MD;  Location: [facility];  Service: General;  Laterality: N/A;  . CERVICAL FUSION     x2  . CONVERSION TO TOTAL HIP Left 10/31/2020   Procedure: CONVERSION TO ANTERIOR TOTAL HIP;  Surgeon: Paralee Cancel, MD;  Location: [facility];  Service: Orthopedics;   Laterality: Left;  2 hrs  . FEMUR IM NAIL Left 03/03/2020   Procedure: INTRAMEDULLARY (IM) NAIL FEMORAL;  Surgeon: Paralee Cancel, MD;  Location: [facility];  Service: Orthopedics;  Laterality: Left;  . LAPAROSCOPIC APPENDECTOMY N/A 03/04/2017   Procedure: APPENDECTOMY LAPAROSCOPIC;  Surgeon: Johnathan Hausen, MD;  Location: [facility];  Service: General;  Laterality: N/A;  . LAPAROSCOPY N/A 03/04/2017   Procedure: LAPAROSCOPY DIAGNOSTIC, ENTEROLYSIS;  Surgeon: Johnathan Hausen, MD;  Location: [facility];  Service: General;  Laterality: N/A;  . LAPAROTOMY N/A 07/09/2017   Procedure: EXPLORATORY LAPAROTOMY;  Surgeon: Johnathan Hausen, MD;  Location: [facility];  Service: General;  Laterality: N/A;  . SPINE SURGERY     Lumbar- rod placement  . TONSILLECTOMY     87y/o  . TOTAL VAGINAL HYSTERECTOMY      Social History:  Social History   Socioeconomic History  . Marital status: Widowed    Spouse name: Not on file  . Number of children: 2  . Years of education: Therapist, sports  . Highest  education level: Not on file  Occupational History  . Occupation: Retired   Tobacco Use  . Smoking status: Former Research scientist (life sciences)  . Smokeless tobacco: Never Used  . Tobacco comment: Smoked from age [age]  Vaping Use  . Vaping Use: Never used  Substance and Sexual Activity  . Alcohol use: Not Currently    Comment: occasional glass of wine  . Drug use: No  . Sexual activity: Not Currently  Other Topics Concern  . Not on file  Social History Narrative   Lives at [facility] independent living    Caffeine use: occasional cup of coffee, 2-3 times a week   Left handed         Diet: Regular      Do you drink/ eat things with caffeine? No      Marital status: Widowed                              What year were you married ? 1986      Do you live in a house, apartment,assistred living, condo, trailer, etc.)? [place]       Is it one or more stories? 4 th floor      How many persons live in your home ? 1      Do you  have any pets in your home ?(please list) No      Highest Level of education completed: Registered Nurse       Current or past profession: RN      Do you exercise?  Yes                            Type & how often 5-6 x WK      ADVANCED DIRECTIVES (Please bring copies)      Do you have a living will? Yes      Do you have a DNR form?   Yes                    If not, do you want to discuss one?       Do you have signed POA?HPOA forms?   Yes              If so, please bring to your appointment      FUNCTIONAL STATUS- To be completed by Spouse / child / Staff       Do you have difficulty bathing or dressing yourself ? No      Do you have difficulty preparing food or eating ?  No      Do you have difficulty managing your mediation ?No      Do you have difficulty managing your finances ? No      Do you have difficulty affording your medication ? No      Social Determinants of Radio broadcast assistant Strain: Not on file  Food Insecurity: Not on file  Transportation Needs: Not on file  Physical Activity: Not on file  Stress: Not on file  Social Connections: Not on file  Intimate Partner Violence: Not on file    Family History:  Family History  Problem Relation Age of Onset  . Asthma Mother   . Pulmonary embolism Mother   . Macular degeneration Mother   . Heart disease Father   . Stroke Father   . Stroke Paternal Uncle   .  Breast cancer Maternal Aunt   . Macular degeneration Sister   . Stroke Sister   . Neuropathy Neg Hx     Medications:   Current Outpatient Medications on File Prior to Visit  Medication Sig Dispense Refill  . acetaminophen (TYLENOL) 500 MG tablet Take 1,000 mg by mouth at bedtime.    . Calcium Carbonate (CALCIUM 500 PO) Take 500 mg by mouth daily.     . furosemide (LASIX) 20 MG tablet Take 20 mg by mouth every Monday, Wednesday, and Friday.     . ibandronate (BONIVA) 150 MG tablet Take 150 mg by mouth every 30 (thirty) days. Take in the morning  with a full glass of water, on an empty stomach, and do not take anything else by mouth or lie down for the next 30 min.    . levETIRAcetam (KEPPRA) 500 MG tablet Take 500 mg by mouth daily.     Marland Kitchen levothyroxine (SYNTHROID) 75 MCG tablet Take 1 tablet (75 mcg total) by mouth daily before breakfast. 30 tablet 1  . LORazepam (ATIVAN) 0.5 MG tablet Take 0.5 tablets (0.25 mg total) by mouth 2 (two) times daily as needed for anxiety. 30 tablet 0  . LORazepam (ATIVAN) 0.5 MG tablet Take 0.5 mg by mouth every 8 (eight) hours.    . Melatonin 10 MG CAPS Take 10 mg by mouth at bedtime.    . Olopatadine HCl 0.7 % SOLN Apply to eye.    Marland Kitchen omeprazole (PRILOSEC) 20 MG capsule Take 20 mg by mouth daily as needed (indigestion.).     Marland Kitchen polyethylene glycol (MIRALAX / GLYCOLAX) 17 g packet Take 17 g by mouth daily.     . potassium chloride (MICRO-K) 10 MEQ CR capsule Take 10 mEq by mouth every Monday, Wednesday, and Friday.     . senna-docusate (SENOKOT-S) 8.6-50 MG tablet Take 2 tablets by mouth 2 (two) times daily. (Patient taking differently: Take 1 tablet by mouth 2 (two) times daily.)    . traMADol (ULTRAM) 50 MG tablet 1 tablet as needed    . Vitamin D, Cholecalciferol, 50 MCG (2000 UT) CAPS Take 2,000 Units by mouth daily.     Marland Kitchen zolpidem (AMBIEN) 5 MG tablet Take 5 mg by mouth at bedtime as needed for sleep.     No current facility-administered medications on file prior to visit.    Allergies:   Allergies  Allergen Reactions  . Codeine Rash  . Penicillins Rash    Has patient had a PCN reaction causing immediate rash, facial/tongue/throat swelling, SOB or lightheadedness with hypotension: No Has patient had a PCN reaction causing severe rash involving mucus membranes or skin necrosis: No Has patient had a PCN reaction that required hospitalization: No Has patient had a PCN reaction occurring within the last 10 years: No If all of the above answers are "NO", then may proceed with Cephalosporin  use.  Tolerated Cephalosporin 10/31/20    . Levaquin [Levofloxacin]       OBJECTIVE:  Physical Exam  Vitals:   03/03/21 0946  BP: 140/83  Pulse: 81  Weight: 101 lb (45.8 kg)  Height: 5' (1.524 m)   Body mass index is 19.73 kg/m. No exam data present  General: Frail very pleasant elderly Caucasian female, seated, in no evident distress Head: head normocephalic and atraumatic.   Neck: supple with no carotid or supraclavicular bruits Cardiovascular: regular rate and rhythm, no murmurs Musculoskeletal: no deformity Skin:  no rash/petichiae Vascular:  Normal pulses all extremities  Neurologic Exam Mental Status: Awake and fully alert.   Fluent speech and language.  Oriented to place and time. Recent and remote memory intact. Attention span, concentration and fund of knowledge appropriate. Mood and affect appropriate.  Cranial Nerves: Pupils equal, briskly reactive to light. Extraocular movements full without nystagmus. Visual fields full to confrontation. Hearing intact. Facial sensation intact. Face, tongue, palate moves normally and symmetrically.  Motor: Normal bulk and tone. Normal strength in all tested extremity muscles. Sensory.: intact to touch , pinprick , position and vibratory sensation.  Coordination: Rapid alternating movements normal in all extremities. Finger-to-nose and heel-to-shin performed accurately bilaterally. Gait and Station: Arises from chair without difficulty. Stance is normal. Gait demonstrates normal stride length and balance without use of assistive device Reflexes: 1+ and symmetric. Toes downgoing.         ASSESSMENT: Sara Davila is a 87 y.o. year old female presented with left-sided weakness on 06/20/2019 with stroke work-up largely unremarkable and symptoms likely related to right brain TIA secondary to small vessel disease.  Returned on 07/11/2020 with reoccurring left-sided weakness and visual impairment with MRI negative but EEG showed  evidence of epileptogenicity arising from right frontal region and cortical dysfunction in right hemisphere.  Vascular risk factors include prior TIA history, HTN, HLD, prediabetes (A1c 6.1), advanced age, former tobacco use, and EtOH use.  She is a long-term resident of [facility].      PLAN:  1. R brain TIA :  a. Previously on aspirin 81 mg daily  and atorvastatin but facility d/c'd per patient request - educated on indication for antithrombotic and statin especially as unable to completely rule out TIA -difficulty tolerating aspirin and discussed use of Plavix but she declines -verbalizes understanding of increased risk.  Also declines use of statin therapy b. Close PCP follow up for aggressive stroke risk factor management c. HTN: BP goal<130/90.  Well-controlled managed by PCP/facility d. HLD: LDL goal<70.  Declines use of statin therapy. Request routine monitoring per facility 2. Seizures:  a. Stable without recurring symptoms or seizures b. Continue Keppra XR 500 mg daily for seizure prophylaxis 3. BLE neuropathy: a. Unknown etiology per pt although is prediabetic b. Abnormal LE movements possibly part of neuropathy with lower suspicion of RLS c. Recommend initiating gabapentin 300 mg nightly d. Discontinue Requip as not beneficial per patient e. If symptoms persist, may consider increasing gabapentin dosage or possible use of low-dose amitriptyline which can further assist with underlying anxiety and sleep difficulty f. May consider further work-up or evaluation at follow-up visit if indicated such as if symptoms progress or worsen -previously seen by PCP for paresthesias of both feet since 2017 -only minimal change since that time    Follow up in 6 months or call earlier if needed   CC:  [facility] provider: Dr. Abelino Derrick, Rene Kocher, MD    I spent 35 minutes of face-to-face and non-face-to-face time with patient.  This included previsit chart review, lab review, study  review, order entry, electronic health record documentation, patient education regarding prior TIA prior seizure activity and ongoing use of AED, BLE neuropathy, importance of managing stroke risk factors and answered all other questions to patient satisfaction   Frann Rider, AGNP-BC  [facility] [address] [place], [place] [address]  Phone [phone] Fax [phone] Note: This document was prepared with digital dictation and possible smart phrase technology. Any transcriptional errors that result from this process are unintentional.

## 2021-03-03 NOTE — Assessment & Plan Note (Signed)
Na 133 01/07/21

## 2021-03-03 NOTE — Assessment & Plan Note (Signed)
off  Atorvastatin, ASA per patient's request.

## 2021-03-03 NOTE — Assessment & Plan Note (Signed)
[facility] started Gabapentin 300mg  hs per neurology(failed Gabapentin in the past), off Requip.

## 2021-03-03 NOTE — Assessment & Plan Note (Signed)
Seizure, saw neurology 03/03/21, takes Keppra. Hospitalized 8/12-8/19 for seizure with presentation of left sided weakness again. EEG showed focal seizure right frontal region.Takes Keppra 500mg qd.

## 2021-03-03 NOTE — Assessment & Plan Note (Addendum)
OA, left hip, s/p total left hip, multiple sites, it seems getting worse, usual  Gets better as day goes by,  arthroplasty,  takes Tylenol. Desires Celebrex today-the patient is aware of the possible negative side effects. Update CBC/diff, CMP/eGFR, ESR, CRP.

## 2021-03-03 NOTE — Patient Instructions (Signed)
Your Plan:  Continue Keppra XR 500 mg daily for seizure prevention  Start gabapentin 300 mg nightly for neuropathy  Discontinue Requip If neuropathy symptoms persist, may consider increasing gabapentin dosage or possibly adding low dose amitriptyline which can further help with neuropathy as well as sleep and anxiety    Follow-up in 6 months or call earlier if needed      Thank you for coming to see [place] at [facility]. I hope we have been able to provide you high quality care today.  You may receive a patient satisfaction survey over the next few weeks. We would appreciate your feedback and comments so that we may continue to improve ourselves and the health of our patients.

## 2021-03-03 NOTE — Assessment & Plan Note (Addendum)
takes Furosemide, Bun/creat 20/0.59 01/07/21

## 2021-03-03 NOTE — Assessment & Plan Note (Signed)
takes Omeprazole. Hgb 10.3 11/12/20

## 2021-03-03 NOTE — Progress Notes (Addendum)
Location:   [place] Room Number: 599 Place of Service:  ALF (13) Provider: [facility] Jelena Malicoat NP  [name], MD  Patient Care Team: [name], MD as PCP - General (Internal Medicine)  Extended Emergency Contact Information Primary Emergency Contact: Milon Dikes [place] Phone: [phone] Mobile Phone: [phone] Relation: Friend Secondary Emergency Contact: Rodman Key Address: [address]          [place] [address] Johnnette Litter of [place] Phone: [phone] Mobile Phone: [phone] Relation: Son  Code Status: DNR Goals of care: Advanced Directive information Advanced Directives 11/27/2020  Does Patient Have a Medical Advance Directive? Yes  Type of Advance Directive Living will;Out of facility DNR (pink MOST or yellow form)  Does patient want to make changes to medical advance directive? No - Patient declined  Copy of [place] in Chart? -  Would patient like information on creating a medical advance directive? -  Pre-existing out of facility DNR order (yellow form or pink MOST form) Pink MOST form placed in chart (order not valid for inpatient use);Yellow form placed in chart (order not valid for inpatient use)     Chief Complaint  Patient presents with  . Acute Visit    Progressing of arthritic pain all over.     HPI:  Pt is a 87 y.o. female seen today for an acute visit for c/o pain allover, shoulders, lower back/hips, knees, stiffness in am, fatigue.   RLS started Gabapentin 355m hs per neurology(failed Gabapentin in the past), off Requip.   Edema BLE, takes Furosemide, Bun/creat 20/0.59 01/07/21 Seizure, saw neurology 03/03/21, takes Keppra. Hospitalized 8/12-8/19 for seizure with presentation of left sided weakness again. EEG showed focal seizure right frontal region.Takes Keppra 5043md. HTN, takes Amlodipine, Furosemide, Bun/creat  20/0.59 01/07/21 Hypothyroidism, TSH 3.73 01/07/21, takes Levothyroxine Hyponatremia, Na 133 01/07/21 Anxiety, not sleeping well, prn Lorazepam, Zolpidem GERD, takes Omeprazole. Hgb 10.3 11/12/20 Constipation, takes MiraLax, Senokot S, Colace OA, left hip, s/p total left hip, multiple sites, it seems getting worse, usual  Gets better as day goes by,  arthroplasty,  takes Tylenol. Desires Celebrex today.  OP takes Boniva, Ca, pending DEXA TIA, off  Atorvastatin, ASA per patient's request.     Past Medical History:  Diagnosis Date  . Abdominal pain 02/26/2017  . Anxiety   . Arthritis    Left Knee, Wrist, Back and Hips  . Cervical vertebral fusion   . Diverticulitis   . Diverticulosis   . Fibromyalgia   . GERD (gastroesophageal reflux disease)   . Gout 02/2018   L hand  . Hypertension     after d/c from hospital no current problems or medication  . Hypothyroidism   . Pelvis fracture ([place]  . Peritoneal free air 03/29/2012  . Pneumonia   . Pneumoperitoneum 02/26/2017  . Pre-diabetes   . Seizure (HCOasis  . Thyroid disorder   . Toe pain 09/06/2016   Past Surgical History:  Procedure Laterality Date  . ABDOMINAL HYSTERECTOMY    . BOWEL RESECTION N/A 07/09/2017   Procedure: SMALL BOWEL RESECTION;  Surgeon: MaJohnathan HausenMD;  Location: [facility];  Service: General;  Laterality: N/A;  . CERVICAL FUSION     x2  . CONVERSION TO TOTAL HIP Left 10/31/2020   Procedure: CONVERSION TO ANTERIOR TOTAL HIP;  Surgeon: OlParalee CancelMD;  Location: [facility];  Service: Orthopedics;  Laterality: Left;  2 hrs  . FEMUR IM NAIL Left 03/03/2020   Procedure: INTRAMEDULLARY (IM) NAIL FEMORAL;  Surgeon: Paralee Cancel, MD;  Location: [facility];  Service: Orthopedics;  Laterality: Left;  . LAPAROSCOPIC APPENDECTOMY N/A 03/04/2017   Procedure: APPENDECTOMY LAPAROSCOPIC;  Surgeon: Johnathan Hausen, MD;  Location: [facility];   Service: General;  Laterality: N/A;  . LAPAROSCOPY N/A 03/04/2017   Procedure: LAPAROSCOPY DIAGNOSTIC, ENTEROLYSIS;  Surgeon: Johnathan Hausen, MD;  Location: [facility];  Service: General;  Laterality: N/A;  . LAPAROTOMY N/A 07/09/2017   Procedure: EXPLORATORY LAPAROTOMY;  Surgeon: Johnathan Hausen, MD;  Location: [facility];  Service: General;  Laterality: N/A;  . SPINE SURGERY     Lumbar- rod placement  . TONSILLECTOMY     87y/o  . TOTAL VAGINAL HYSTERECTOMY      Allergies  Allergen Reactions  . Codeine Rash  . Penicillins Rash    Has patient had a PCN reaction causing immediate rash, facial/tongue/throat swelling, SOB or lightheadedness with hypotension: No Has patient had a PCN reaction causing severe rash involving mucus membranes or skin necrosis: No Has patient had a PCN reaction that required hospitalization: No Has patient had a PCN reaction occurring within the last 10 years: No If all of the above answers are "NO", then may proceed with Cephalosporin use.  Tolerated Cephalosporin 10/31/20    . Levaquin [Levofloxacin]     Allergies as of 03/03/2021      Reactions   Codeine Rash   Penicillins Rash   Has patient had a PCN reaction causing immediate rash, facial/tongue/throat swelling, SOB or lightheadedness with hypotension: No Has patient had a PCN reaction causing severe rash involving mucus membranes or skin necrosis: No Has patient had a PCN reaction that required hospitalization: No Has patient had a PCN reaction occurring within the last 10 years: No If all of the above answers are "NO", then may proceed with Cephalosporin use. Tolerated Cephalosporin 10/31/20   Levaquin [levofloxacin]       Medication List       Accurate as of March 03, 2021 11:59 PM. If you have any questions, ask your nurse or doctor.        STOP taking these medications   doxylamine (Sleep) 25 MG tablet Commonly known as: UNISOM Stopped by: Frann Rider, NP   HYDROcodone-acetaminophen 5-325 MG  tablet Commonly known as: NORCO/VICODIN Stopped by: Frann Rider, NP   rOPINIRole 0.5 MG tablet Commonly known as: REQUIP Stopped by: Frann Rider, NP     TAKE these medications   acetaminophen 500 MG tablet Commonly known as: TYLENOL Take 1,000 mg by mouth at bedtime.   CALCIUM 500 PO Take 500 mg by mouth daily.   furosemide 20 MG tablet Commonly known as: LASIX Take 20 mg by mouth every Monday, Wednesday, and Friday.   gabapentin 300 MG capsule Commonly known as: NEURONTIN Take 1 capsule (300 mg total) by mouth at bedtime. Started by: Frann Rider, NP   ibandronate 150 MG tablet Commonly known as: BONIVA Take 150 mg by mouth every 30 (thirty) days. Take in the morning with a full glass of water, on an empty stomach, and do not take anything else by mouth or lie down for the next 30 min.   levETIRAcetam 500 MG tablet Commonly known as: KEPPRA Take 500 mg by mouth daily.   levothyroxine 75 MCG tablet Commonly known as: SYNTHROID Take 1 tablet (75 mcg total) by mouth daily before breakfast.   LORazepam 0.5 MG tablet Commonly  known as: ATIVAN Take 0.5 mg by mouth every 8 (eight) hours.   LORazepam 0.5 MG tablet Commonly known as: Ativan Take 0.5 tablets (0.25 mg total) by mouth 2 (two) times daily as needed for anxiety.   Melatonin 10 MG Caps Take 10 mg by mouth at bedtime.   Olopatadine HCl 0.7 % Soln Apply to eye.   omeprazole 20 MG capsule Commonly known as: PRILOSEC Take 20 mg by mouth daily as needed (indigestion.).   polyethylene glycol 17 g packet Commonly known as: MIRALAX / GLYCOLAX Take 17 g by mouth daily.   potassium chloride 10 MEQ CR capsule Commonly known as: MICRO-K Take 10 mEq by mouth every Monday, Wednesday, and Friday.   senna-docusate 8.6-50 MG tablet Commonly known as: Senokot-S Take 2 tablets by mouth 2 (two) times daily. What changed: how much to take   traMADol 50 MG tablet Commonly known as: ULTRAM 1 tablet as needed    vitamin D3 50 MCG (2000 UT) Caps Take 2,000 Units by mouth daily.   zolpidem 5 MG tablet Commonly known as: AMBIEN Take 5 mg by mouth at bedtime as needed for sleep.       Review of Systems  Constitutional: Negative for activity change, fatigue and fever.  HENT: Negative for congestion and voice change.   Eyes: Positive for discharge. Negative for visual disturbance.  Respiratory: Negative for cough and shortness of breath.        Chronic cough  Cardiovascular: Positive for leg swelling. Negative for palpitations.  Gastrointestinal: Negative for abdominal pain and constipation.  Genitourinary: Negative for dysuria and urgency.       Bathroom trips 1-2/night  Musculoskeletal: Positive for arthralgias and gait problem. Negative for back pain.       Left hip pain, s/p total left hip arthroplasty. Multiple sites aches in am, better as day goes by.   Skin: Negative for color change.  Neurological: Negative for seizures, speech difficulty, weakness and headaches.       Restless legs at night.   Psychiatric/Behavioral: Positive for sleep disturbance. Negative for behavioral problems and hallucinations. The patient is not nervous/anxious.     Immunization History  Administered Date(s) Administered  . Influenza-Unspecified 09/01/2018, 09/10/2020  . Moderna Sars-Covid-2 Vaccination 12/04/2019, 01/01/2020  . Zoster Recombinat (Shingrix) 07/05/2018   Pertinent  Health Maintenance Due  Topic Date Due  . DEXA SCAN  Never done  . INFLUENZA VACCINE  06/30/2021  . PNA vac Low Risk Adult  Completed   Fall Risk  06/14/2020 04/26/2020  Falls in the past year? 0 1  Number falls in past yr: 0 0  Injury with Fall? - 1   Functional Status Survey:    Vitals:   03/03/21 1312  BP: 116/70  Pulse: 76  Resp: 18  Temp: (!) 97 F (36.1 C)  SpO2: 96%   There is no height or weight on file to calculate BMI. Physical Exam Constitutional:      Appearance: Normal appearance.  HENT:     Head:  Normocephalic and atraumatic.     Mouth/Throat:     Mouth: Mucous membranes are moist.  Eyes:     Conjunctiva/sclera: Conjunctivae normal.     Pupils: Pupils are equal, round, and reactive to light.  Cardiovascular:     Rate and Rhythm: Normal rate and regular rhythm.     Heart sounds: No murmur heard.     Comments: Weak left DP pulse, more symptomatic tingling sensation at night.  Pulmonary:  Effort: Pulmonary effort is normal.     Breath sounds: No rales.  Abdominal:     General: Bowel sounds are normal.     Palpations: Abdomen is soft.     Tenderness: There is no abdominal tenderness.  Musculoskeletal:        General: No tenderness.     Cervical back: Normal range of motion and neck supple.     Right lower leg: Edema present.     Left lower leg: Edema present.     Comments: Trace edema BLE,  mild scoliosis.   Skin:    General: Skin is warm and dry.  Neurological:     General: No focal deficit present.     Mental Status: She is alert and oriented to person, place, and time. Mental status is at baseline.     Gait: Gait abnormal.     Comments: Using walker sometimes.   Psychiatric:        Mood and Affect: Mood normal.        Behavior: Behavior normal.        Thought Content: Thought content normal.        Judgment: Judgment normal.     Labs reviewed: Recent Labs    10/21/20 1212 11/01/20 0315 11/02/20 0351  NA 133* 133* 130*  K 5.6* 4.1 4.1  CL 97* 103 99  CO2 _0 GLUCOSE 92 132* 105*  BUN _1 CREATININE 0.71 0.49 0.42*  CALCIUM 9.8 8.1* 8.0*   Recent Labs    06/13/20 0700 06/19/20 1248 07/11/20 0914 07/25/20 0000 07/30/20 0000  AST _2 ALT _3 ALKPHOS  --  55 62 63 66  BILITOT 0.6 0.6 0.8  --   --   PROT 6.6 6.9 6.8  --   --   ALBUMIN  --  3.7 3.9 3.9 4.0   Recent Labs    07/30/20 0000 10/21/20 1212 11/01/20 0315 11/02/20 0351 11/12/20 0000 11/19/20 0000  WBC 6.5 5.7 7.6 9.3 8.7 6.8  NEUTROABS 3,900   --   --   --  5,377.00 3,488.00  HGB 12.8 13.0 8.8* 8.6* 10.3* 12.5  HCT 37 39.0 26.4* 26.0* 30* 36  MCV  --  98.0 98.1 99.6  --   --   PLT 519* 410* 289 266 838* 901*   Lab Results  Component Value Date   TSH 2.389 06/19/2020   Lab Results  Component Value Date   HGBA1C 5.8 (H) 10/21/2020   Lab Results  Component Value Date   CHOL 171 06/20/2020   HDL 59 06/20/2020   LDLCALC 94 06/20/2020   TRIG 89 06/20/2020   CHOLHDL 2.9 06/20/2020    Significant Diagnostic Results in last 30 days:  No results found.  Assessment/Plan: Osteoarthritis, multiple sites OA, left hip, s/p total left hip, multiple sites, it seems getting worse, usual  Gets better as day goes by,  arthroplasty,  takes Tylenol. Desires Celebrex today-the patient is aware of the possible negative side effects. Update CBC/diff, CMP/eGFR, ESR, CRP.  Neuropathy RLS started Gabapentin 324m hs per neurology(failed Gabapentin in the past), off Requip.    Venous insufficiency takes Furosemide, Bun/creat 20/0.59 01/07/21   Seizure ([place] Seizure, saw neurology 03/03/21, takes Keppra. Hospitalized 8/12-8/19 for seizure with presentation of left sided weakness again. EEG showed focal seizure right frontal region.Takes Keppra 5049md.   HTN (hypertension), benign takes Amlodipine, Furosemide, Bun/creat 20/0.59 01/07/21  Hypothyroidism TSH 3.73 01/07/21, takes Levothyroxine   Hyponatremia  Na 133 01/07/21   Insomnia secondary to anxiety not sleeping well, prn Lorazepam, Zolpidem   GERD (gastroesophageal reflux disease) takes Omeprazole. Hgb 10.3 11/12/20   Slow transit constipation takes MiraLax, Senokot S, Colace   Osteoporosis takes Boniva, Ca, pending DEXA   Restless leg syndrome 03/03/21 neurology stopped Requip, re-started Gabapentin.   TIA (transient ischemic attack) off  Atorvastatin, ASA per patient's request.       Family/ staff Communication: plan of care reviewed with the patient and  charge nurse.   Labs/tests ordered:  CBC/diff, CMP/eGFR, ESR, CPR  Time spend 40 minutes.

## 2021-03-03 NOTE — Assessment & Plan Note (Signed)
TSH 3.73 01/07/21, takes Levothyroxine

## 2021-03-03 NOTE — Assessment & Plan Note (Signed)
takes Boniva, Ca, pending DEXA

## 2021-03-03 NOTE — Assessment & Plan Note (Signed)
not sleeping well, prn Lorazepam, Zolpidem

## 2021-03-04 ENCOUNTER — Encounter: Payer: Self-pay | Admitting: Nurse Practitioner

## 2021-03-04 NOTE — Progress Notes (Signed)
I agree with the above plan 

## 2021-03-21 ENCOUNTER — Encounter: Payer: Self-pay | Admitting: Nurse Practitioner

## 2021-03-21 ENCOUNTER — Non-Acute Institutional Stay: Payer: Medicare Other | Admitting: Nurse Practitioner

## 2021-03-21 DIAGNOSIS — G459 Transient cerebral ischemic attack, unspecified: Secondary | ICD-10-CM

## 2021-03-21 DIAGNOSIS — K5901 Slow transit constipation: Secondary | ICD-10-CM

## 2021-03-21 DIAGNOSIS — K219 Gastro-esophageal reflux disease without esophagitis: Secondary | ICD-10-CM

## 2021-03-21 DIAGNOSIS — F419 Anxiety disorder, unspecified: Secondary | ICD-10-CM | POA: Diagnosis not present

## 2021-03-21 DIAGNOSIS — M8000XS Age-related osteoporosis with current pathological fracture, unspecified site, sequela: Secondary | ICD-10-CM

## 2021-03-21 DIAGNOSIS — I10 Essential (primary) hypertension: Secondary | ICD-10-CM

## 2021-03-21 DIAGNOSIS — E039 Hypothyroidism, unspecified: Secondary | ICD-10-CM

## 2021-03-21 DIAGNOSIS — H1013 Acute atopic conjunctivitis, bilateral: Secondary | ICD-10-CM

## 2021-03-21 DIAGNOSIS — F5105 Insomnia due to other mental disorder: Secondary | ICD-10-CM

## 2021-03-21 DIAGNOSIS — I872 Venous insufficiency (chronic) (peripheral): Secondary | ICD-10-CM

## 2021-03-21 DIAGNOSIS — E871 Hypo-osmolality and hyponatremia: Secondary | ICD-10-CM

## 2021-03-21 DIAGNOSIS — G2581 Restless legs syndrome: Secondary | ICD-10-CM

## 2021-03-21 DIAGNOSIS — M8949 Other hypertrophic osteoarthropathy, multiple sites: Secondary | ICD-10-CM

## 2021-03-21 DIAGNOSIS — H1010 Acute atopic conjunctivitis, unspecified eye: Secondary | ICD-10-CM | POA: Insufficient documentation

## 2021-03-21 DIAGNOSIS — R569 Unspecified convulsions: Secondary | ICD-10-CM

## 2021-03-21 DIAGNOSIS — M159 Polyosteoarthritis, unspecified: Secondary | ICD-10-CM

## 2021-03-21 NOTE — Assessment & Plan Note (Signed)
Hyponatremia, Na 130s.  

## 2021-03-21 NOTE — Assessment & Plan Note (Signed)
takes MiraLax, Senokot S, Colace.  

## 2021-03-21 NOTE — Assessment & Plan Note (Signed)
TSH 3.73 01/07/21, takes Levothyroxine 

## 2021-03-21 NOTE — Assessment & Plan Note (Signed)
Edema BLE, trace edema ankle/feet, takes Furosemide 

## 2021-03-21 NOTE — Assessment & Plan Note (Signed)
Seizure, onset 2019, saw neurology 03/03/21, takes Keppra. Hospitalized 8/12-8/19 for seizure with presentation of left sided weakness again. EEG showed focal seizure right frontal region.Takes Keppra 500mg qd

## 2021-03-21 NOTE — Assessment & Plan Note (Signed)
OA, left hip,s/p total left hip, multiple sites, it seems getting worse, usual  Gets better as day goes by,  arthroplasty,takes Tylenol, prn Tramadol,  pain allover, shoulders, lower back/hips, knees, stiffness in am, fatigue. Failed Celebrex, wants to try Aleve

## 2021-03-21 NOTE — Assessment & Plan Note (Signed)
Anxiety, not sleeping well, prn Lorazepam, Zolpidem, not adequate, agree to try Mirtazapine

## 2021-03-21 NOTE — Assessment & Plan Note (Signed)
uses Pataday eye drops 

## 2021-03-21 NOTE — Assessment & Plan Note (Signed)
takes Omeprazole 

## 2021-03-21 NOTE — Assessment & Plan Note (Signed)
RLS started Gabapentin 300mg hs per neurology(failed Gabapentin in the past), off Requip.   

## 2021-03-21 NOTE — Progress Notes (Signed)
Location:   [place] Room Number: 601 Place of Service:  ALF (13) Provider: [facility] Briley Bumgarner NP  [name], MD  Patient Care Team: [name], MD as PCP - General (Internal Medicine)  Extended Emergency Contact Information Primary Emergency Contact: Milon Dikes [place] Phone: [phone] Mobile Phone: [phone] Relation: Friend Secondary Emergency Contact: Rodman Key Address: [address]          [place] [address] Johnnette Litter of [place] Phone: [phone] Mobile Phone: [phone] Relation: Son  Code Status:  DNR Goals of care: Advanced Directive information Advanced Directives 03/21/2021  Does Patient Have a Medical Advance Directive? Yes  Type of Advance Directive Living will;Out of facility DNR (pink MOST or yellow form)  Does patient want to make changes to medical advance directive? No - Patient declined  Copy of [place] in Chart? -  Would patient like information on creating a medical advance directive? -  Pre-existing out of facility DNR order (yellow form or pink MOST form) Pink MOST form placed in chart (order not valid for inpatient use)     Chief Complaint  Patient presents with  . Medical Management of Chronic Issues    Routine follow up visit. Discuss need for dexa scan, COVID 19 booster    HPI:  Pt is a 87 y.o. female seen today for medical management of chronic diseases.    RLS started Gabapentin 300mg  hs per neurology(failed Gabapentin in the past), off Requip.              Edema BLE, trace edema ankle/feet, takes Furosemide Seizure, saw neurology 03/03/21, takes Keppra. Hospitalized 8/12-8/19 for seizure with presentation of left sided weakness again. EEG showed focal seizure right frontal region.Takes Keppra 500mg qd. HTN, takes Amlodipine, Furosemide Hypothyroidism, TSH 3.73 01/07/21, takes  Levothyroxine Hyponatremia, Na 130s Anxiety, not sleeping well, prn Lorazepam, Zolpidem, not adequate, agree to try Mirtazapine GERD, takes Omeprazole. Constipation, takes MiraLax, Senokot S, Colace OA, left hip,s/p total left hip, multiple sites, it seems getting worse, usual  Gets better as day goes by,  arthroplasty,takes Tylenol, prn Tramadol,  pain allover, shoulders, lower back/hips, knees, stiffness in am, fatigue. Failed Celebrex, wants to try Aleve  Allergic conjunctivitis, uses Pataday eye drops  OP takes Boniva, Ca, pending DEXA TIA,offAtorvastatin, ASA per patient's request.  Past Medical History:  Diagnosis Date  . Abdominal pain 02/26/2017  . Anxiety   . Arthritis    Left Knee, Wrist, Back and Hips  . Cervical vertebral fusion   . Diverticulitis   . Diverticulosis   . Fibromyalgia   . GERD (gastroesophageal reflux disease)   . Gout 02/2018   L hand  . Hypertension     after d/c from hospital no current problems or medication  . Hypothyroidism   . Pelvis fracture ([place])   . Peritoneal free air 03/29/2012  . Pneumonia   . Pneumoperitoneum 02/26/2017  . Pre-diabetes   . Seizure ([place])   . Thyroid disorder   . Toe pain 09/06/2016   Past Surgical History:  Procedure Laterality Date  . ABDOMINAL HYSTERECTOMY    . BOWEL RESECTION N/A 07/09/2017   Procedure: SMALL BOWEL RESECTION;  Surgeon: Johnathan Hausen, MD;  Location: [facility];  Service: General;  Laterality: N/A;  . CERVICAL FUSION     x2  . CONVERSION TO TOTAL HIP Left 10/31/2020   Procedure: CONVERSION TO ANTERIOR  TOTAL HIP;  Surgeon: Paralee Cancel, MD;  Location: [facility];  Service: Orthopedics;  Laterality: Left;  2 hrs  . FEMUR IM NAIL Left 03/03/2020   Procedure: INTRAMEDULLARY (IM) NAIL FEMORAL;  Surgeon: Paralee Cancel, MD;  Location: [facility];  Service: Orthopedics;  Laterality: Left;  . LAPAROSCOPIC APPENDECTOMY N/A 03/04/2017    Procedure: APPENDECTOMY LAPAROSCOPIC;  Surgeon: Johnathan Hausen, MD;  Location: [facility];  Service: General;  Laterality: N/A;  . LAPAROSCOPY N/A 03/04/2017   Procedure: LAPAROSCOPY DIAGNOSTIC, ENTEROLYSIS;  Surgeon: Johnathan Hausen, MD;  Location: [facility];  Service: General;  Laterality: N/A;  . LAPAROTOMY N/A 07/09/2017   Procedure: EXPLORATORY LAPAROTOMY;  Surgeon: Johnathan Hausen, MD;  Location: [facility];  Service: General;  Laterality: N/A;  . SPINE SURGERY     Lumbar- rod placement  . TONSILLECTOMY     87y/o  . TOTAL VAGINAL HYSTERECTOMY      Allergies  Allergen Reactions  . Codeine Rash  . Penicillins Rash    Has patient had a PCN reaction causing immediate rash, facial/tongue/throat swelling, SOB or lightheadedness with hypotension: No Has patient had a PCN reaction causing severe rash involving mucus membranes or skin necrosis: No Has patient had a PCN reaction that required hospitalization: No Has patient had a PCN reaction occurring within the last 10 years: No If all of the above answers are "NO", then may proceed with Cephalosporin use.  Tolerated Cephalosporin 10/31/20    . Levaquin [Levofloxacin]     Allergies as of 03/21/2021      Reactions   Codeine Rash   Penicillins Rash   Has patient had a PCN reaction causing immediate rash, facial/tongue/throat swelling, SOB or lightheadedness with hypotension: No Has patient had a PCN reaction causing severe rash involving mucus membranes or skin necrosis: No Has patient had a PCN reaction that required hospitalization: No Has patient had a PCN reaction occurring within the last 10 years: No If all of the above answers are "NO", then may proceed with Cephalosporin use. Tolerated Cephalosporin 10/31/20   Levaquin [levofloxacin]       Medication List       Accurate as of March 21, 2021 11:59 PM. If you have any questions, ask your nurse or doctor.        STOP taking these medications   Melatonin 10 MG Caps Stopped by: Taiquan Campanaro X  Nakota Elsen, NP     TAKE these medications   acetaminophen 500 MG tablet Commonly known as: TYLENOL Take 1,000 mg by mouth at bedtime.   CALCIUM 500 PO Take 500 mg by mouth daily.   furosemide 20 MG tablet Commonly known as: LASIX Take 20 mg by mouth every Monday, Wednesday, and Friday.   gabapentin 100 MG capsule Commonly known as: NEURONTIN Take 100 mg by mouth at bedtime. What changed: Another medication with the same name was removed. Continue taking this medication, and follow the directions you see here. Changed by: Kaysey Berndt X Punam Broussard, NP   ibandronate 150 MG tablet Commonly known as: BONIVA Take 150 mg by mouth every 30 (thirty) days. Take in the morning with a full glass of water, on an empty stomach, and do not take anything else by mouth or lie down for the next 30 min.   levETIRAcetam 500 MG tablet Commonly known as: KEPPRA Take 500 mg by mouth daily.   levothyroxine 75 MCG tablet Commonly known as: SYNTHROID Take 1 tablet (75 mcg total) by mouth daily before breakfast.   LORazepam 0.5 MG tablet Commonly  known as: ATIVAN Take 0.5 mg by mouth every 8 (eight) hours.   LORazepam 0.5 MG tablet Commonly known as: Ativan Take 0.5 tablets (0.25 mg total) by mouth 2 (two) times daily as needed for anxiety.   Olopatadine HCl 0.7 % Soln Apply to eye.   omeprazole 20 MG capsule Commonly known as: PRILOSEC Take 20 mg by mouth daily as needed (indigestion.).   polyethylene glycol 17 g packet Commonly known as: MIRALAX / GLYCOLAX Take 17 g by mouth daily.   potassium chloride 10 MEQ CR capsule Commonly known as: MICRO-K Take 10 mEq by mouth every Monday, Wednesday, and Friday.   senna-docusate 8.6-50 MG tablet Commonly known as: Senokot-S Take 2 tablets by mouth 2 (two) times daily. What changed: how much to take   traMADol 50 MG tablet Commonly known as: ULTRAM 1 tablet as needed   vitamin D3 50 MCG (2000 UT) Caps Take 2,000 Units by mouth daily.   zolpidem 5 MG  tablet Commonly known as: AMBIEN Take 5 mg by mouth at bedtime as needed for sleep.       Review of Systems  Constitutional: Negative for activity change, fatigue and fever.  HENT: Negative for congestion and voice change.   Eyes: Positive for discharge, redness and itching. Negative for visual disturbance.       Due to seasonal allergy  Respiratory: Negative for cough and shortness of breath.        Chronic cough  Cardiovascular: Positive for leg swelling. Negative for palpitations.  Gastrointestinal: Negative for abdominal pain and constipation.  Genitourinary: Negative for dysuria and urgency.       Bathroom trips 1-2/night  Musculoskeletal: Positive for arthralgias and gait problem. Negative for back pain.       Left hip pain, s/p total left hip arthroplasty. Multiple sites aches in am, better as day goes by.   Skin: Negative for color change.  Neurological: Negative for seizures, speech difficulty, weakness and headaches.       Restless legs at night.   Psychiatric/Behavioral: Positive for sleep disturbance. Negative for behavioral problems. The patient is not nervous/anxious.     Immunization History  Administered Date(s) Administered  . Influenza Split 08/11/2018, 08/03/2019  . Influenza-Unspecified 09/01/2018, 09/10/2020  . Moderna Sars-Covid-2 Vaccination 12/04/2019, 01/01/2020  . Pneumococcal Conjugate-13 05/29/2014  . Pneumococcal Polysaccharide-23 06/14/2015  . Tdap 09/14/2012  . Zoster 04/30/2004, 04/13/2018, 07/05/2018  . Zoster Recombinat (Shingrix) 07/05/2018   Pertinent  Health Maintenance Due  Topic Date Due  . DEXA SCAN  Never done  . INFLUENZA VACCINE  06/30/2021  . PNA vac Low Risk Adult  Completed   Fall Risk  06/14/2020 04/26/2020  Falls in the past year? 0 1  Number falls in past yr: 0 0  Injury with Fall? - 1   Functional Status Survey:    Vitals:   03/21/21 1155  BP: 122/64  Pulse: 82  Resp: 20  Temp: 98.4 F (36.9 C)  SpO2: 95%   Weight: 99 lb (44.9 kg)  Height: 5' (1.524 m)   Body mass index is 19.33 kg/m. Physical Exam Constitutional:      Appearance: Normal appearance.  HENT:     Head: Normocephalic and atraumatic.     Mouth/Throat:     Mouth: Mucous membranes are moist.  Eyes:     Pupils: Pupils are equal, round, and reactive to light.     Comments: Watery eyes, sometime itching, redness due to seasonal allergy.   Cardiovascular:  Rate and Rhythm: Normal rate and regular rhythm.     Heart sounds: No murmur heard.     Comments: Weak left DP pulse, more symptomatic tingling sensation at night.  Pulmonary:     Effort: Pulmonary effort is normal.     Breath sounds: No rales.  Abdominal:     General: Bowel sounds are normal.     Palpations: Abdomen is soft.     Tenderness: There is no abdominal tenderness.  Musculoskeletal:        General: No tenderness.     Cervical back: Normal range of motion and neck supple.     Right lower leg: Edema present.     Left lower leg: Edema present.     Comments: Trace edema BLE,  mild scoliosis.   Skin:    General: Skin is warm and dry.  Neurological:     General: No focal deficit present.     Mental Status: She is alert and oriented to person, place, and time. Mental status is at baseline.     Gait: Gait abnormal.     Comments: Using walker sometimes.   Psychiatric:        Mood and Affect: Mood normal.        Behavior: Behavior normal.        Thought Content: Thought content normal.        Judgment: Judgment normal.     Labs reviewed: Recent Labs    10/21/20 1212 11/01/20 0315 11/02/20 0351  NA 133* 133* 130*  K 5.6* 4.1 4.1  CL 97* 103 99  CO2 28 23 23   GLUCOSE 92 132* 105*  BUN 23 13 16   CREATININE 0.71 0.49 0.42*  CALCIUM 9.8 8.1* 8.0*   Recent Labs    06/13/20 0700 06/19/20 1248 07/11/20 0914 07/25/20 0000 07/30/20 0000  AST 19 24 24 19 17   ALT 13 14 19 14 13   ALKPHOS  --  55 62 63 66  BILITOT 0.6 0.6 0.8  --   --   PROT 6.6  6.9 6.8  --   --   ALBUMIN  --  3.7 3.9 3.9 4.0   Recent Labs    07/30/20 0000 10/21/20 1212 11/01/20 0315 11/02/20 0351 11/12/20 0000 11/19/20 0000  WBC 6.5 5.7 7.6 9.3 8.7 6.8  NEUTROABS 3,900  --   --   --  5,377.00 3,488.00  HGB 12.8 13.0 8.8* 8.6* 10.3* 12.5  HCT 37 39.0 26.4* 26.0* 30* 36  MCV  --  98.0 98.1 99.6  --   --   PLT 519* 410* 289 266 838* 901*   Lab Results  Component Value Date   TSH 2.389 06/19/2020   Lab Results  Component Value Date   HGBA1C 5.8 (H) 10/21/2020   Lab Results  Component Value Date   CHOL 171 06/20/2020   HDL 59 06/20/2020   LDLCALC 94 06/20/2020   TRIG 89 06/20/2020   CHOLHDL 2.9 06/20/2020    Significant Diagnostic Results in last 30 days:  No results found.  Assessment/Plan  Insomnia secondary to anxiety Anxiety, not sleeping well, prn Lorazepam, Zolpidem, not adequate, agree to try Mirtazapine  GERD (gastroesophageal reflux disease)  takes Omeprazole.   Slow transit constipation takes MiraLax, Senokot S, Colace   Osteoarthritis, multiple sites OA, left hip,s/p total left hip, multiple sites, it seems getting worse, usual  Gets better as day goes by,  arthroplasty,takes Tylenol, prn Tramadol,  pain allover, shoulders, lower back/hips, knees, stiffness in am,  fatigue. Failed Celebrex, wants to try Aleve  Allergic conjunctivitis uses Pataday eye drops   Osteoporosis OP takes Boniva, Ca, pending DEXA  TIA (transient ischemic attack) Hx of TIA,offAtorvastatin, ASA per patient's request.   Hyponatremia Hyponatremia, Na 130s  Hypothyroidism TSH 3.73 01/07/21, takes Levothyroxine   HTN (hypertension), benign Blood pressure is controlled, continue  Amlodipine, Furosemide   Seizure ([place]) Seizure, onset 2019, saw neurology 03/03/21, takes Keppra. Hospitalized 8/12-8/19 for seizure with presentation of left sided weakness again. EEG showed focal seizure right frontal region.Takes Keppra 500mg qd  Venous  insufficiency Edema BLE, trace edema ankle/feet, takes Furosemide   Restless leg syndrome RLS started Gabapentin 300mg  hs per neurology(failed Gabapentin in the past), off Requip.    Family/ staff Communication: plan of care reviewed with the patient and charge nurse.   Labs/tests ordered: none  Time spend 40 minutes.

## 2021-03-21 NOTE — Assessment & Plan Note (Signed)
Blood pressure is controlled, continue  Amlodipine, Furosemide

## 2021-03-21 NOTE — Assessment & Plan Note (Signed)
OP takes Boniva, Ca, pending DEXA

## 2021-03-21 NOTE — Assessment & Plan Note (Signed)
Hx of TIA,offAtorvastatin, ASA per patient's request.

## 2021-03-24 ENCOUNTER — Encounter: Payer: Self-pay | Admitting: Nurse Practitioner

## 2021-04-02 ENCOUNTER — Encounter: Payer: Self-pay | Admitting: Nurse Practitioner

## 2021-04-02 DIAGNOSIS — E876 Hypokalemia: Secondary | ICD-10-CM | POA: Insufficient documentation

## 2021-04-02 DIAGNOSIS — E875 Hyperkalemia: Secondary | ICD-10-CM | POA: Insufficient documentation

## 2021-04-23 ENCOUNTER — Encounter (HOSPITAL_COMMUNITY): Payer: Self-pay | Admitting: *Deleted

## 2021-04-23 ENCOUNTER — Emergency Department (HOSPITAL_COMMUNITY)
Admission: EM | Admit: 2021-04-23 | Discharge: 2021-04-23 | Disposition: A | Discharge status: Home / Self Care | Payer: Medicare Other | Attending: Emergency Medicine | Admitting: Emergency Medicine

## 2021-04-23 ENCOUNTER — Emergency Department (HOSPITAL_COMMUNITY): Payer: Medicare Other

## 2021-04-23 DIAGNOSIS — I10 Essential (primary) hypertension: Secondary | ICD-10-CM | POA: Diagnosis not present

## 2021-04-23 DIAGNOSIS — E039 Hypothyroidism, unspecified: Secondary | ICD-10-CM | POA: Diagnosis not present

## 2021-04-23 DIAGNOSIS — Z79899 Other long term (current) drug therapy: Secondary | ICD-10-CM | POA: Diagnosis not present

## 2021-04-23 DIAGNOSIS — R1084 Generalized abdominal pain: Secondary | ICD-10-CM

## 2021-04-23 DIAGNOSIS — N3090 Cystitis, unspecified without hematuria: Secondary | ICD-10-CM | POA: Diagnosis not present

## 2021-04-23 DIAGNOSIS — R109 Unspecified abdominal pain: Secondary | ICD-10-CM | POA: Diagnosis present

## 2021-04-23 DIAGNOSIS — Z87891 Personal history of nicotine dependence: Secondary | ICD-10-CM | POA: Insufficient documentation

## 2021-04-23 LAB — CBC WITH DIFFERENTIAL/PLATELET
Abs Immature Granulocytes: 0.01 10*3/uL (ref 0.00–0.07)
Basophils Absolute: 0.1 10*3/uL (ref 0.0–0.1)
Basophils Relative: 1 %
Eosinophils Absolute: 0.4 10*3/uL (ref 0.0–0.5)
Eosinophils Relative: 8 %
HCT: 36 % (ref 36.0–46.0)
Hemoglobin: 12 g/dL (ref 12.0–15.0)
Immature Granulocytes: 0 %
Lymphocytes Relative: 21 %
Lymphs Abs: 1.1 10*3/uL (ref 0.7–4.0)
MCH: 32.4 pg (ref 26.0–34.0)
MCHC: 33.3 g/dL (ref 30.0–36.0)
MCV: 97.3 fL (ref 80.0–100.0)
Monocytes Absolute: 0.5 10*3/uL (ref 0.1–1.0)
Monocytes Relative: 10 %
Neutro Abs: 2.9 10*3/uL (ref 1.7–7.7)
Neutrophils Relative %: 60 %
Platelets: 451 10*3/uL — ABNORMAL HIGH (ref 150–400)
RBC: 3.7 MIL/uL — ABNORMAL LOW (ref 3.87–5.11)
RDW: 13 % (ref 11.5–15.5)
WBC: 5 10*3/uL (ref 4.0–10.5)
nRBC: 0 % (ref 0.0–0.2)

## 2021-04-23 LAB — URINALYSIS, ROUTINE W REFLEX MICROSCOPIC
Bilirubin Urine: NEGATIVE
Glucose, UA: NEGATIVE mg/dL
Ketones, ur: NEGATIVE mg/dL
Nitrite: POSITIVE — AB
Protein, ur: NEGATIVE mg/dL
RBC / HPF: >50 RBC/hpf — ABNORMAL HIGH (ref 0–5)
Specific Gravity, Urine: 1.006 (ref 1.005–1.030)
WBC, UA: >50 WBC/hpf — ABNORMAL HIGH (ref 0–5)
pH: 8 (ref 5.0–8.0)

## 2021-04-23 LAB — COMPREHENSIVE METABOLIC PANEL
ALT: 12 U/L (ref 0–44)
AST: 20 U/L (ref 15–41)
Albumin: 3.9 g/dL (ref 3.5–5.0)
Alkaline Phosphatase: 54 U/L (ref 38–126)
Anion gap: 6 (ref 5–15)
BUN: 20 mg/dL (ref 8–23)
CO2: 31 mmol/L (ref 22–32)
Calcium: 9.2 mg/dL (ref 8.9–10.3)
Chloride: 97 mmol/L — ABNORMAL LOW (ref 98–111)
Creatinine, Ser: 0.68 mg/dL (ref 0.44–1.00)
GFR, Estimated: >60 mL/min (ref >60)
Glucose, Bld: 82 mg/dL (ref 70–99)
Potassium: 4 mmol/L (ref 3.5–5.1)
Sodium: 134 mmol/L — ABNORMAL LOW (ref 135–145)
Total Bilirubin: 0.7 mg/dL (ref 0.3–1.2)
Total Protein: 7.1 g/dL (ref 6.5–8.1)

## 2021-04-23 MED ORDER — CEPHALEXIN 500 MG PO CAPS: 500.0000 mg | ORAL_CAPSULE | Freq: Three times a day (TID) | ORAL | 0 refills | Status: DC

## 2021-04-23 MED ORDER — SODIUM CHLORIDE 0.9 % IV SOLN
1.0000 g | Freq: Once | INTRAVENOUS | Status: AC
  Administered 2021-04-23: 1 g via INTRAVENOUS
  Filled 2021-04-23: qty 10

## 2021-04-23 MED ORDER — IOHEXOL 9 MG/ML PO SOLN
ORAL | Status: AC
  Administered 2021-04-23: 500 mL via ORAL
  Filled 2021-04-23: qty 500

## 2021-04-23 MED ORDER — IOHEXOL 9 MG/ML PO SOLN
500.0000 mL | ORAL | Status: AC
  Administered 2021-04-23: 500 mL via ORAL

## 2021-04-23 NOTE — ED Notes (Signed)
Patient transported to CT 

## 2021-04-23 NOTE — ED Triage Notes (Signed)
Pt w/ hx of surgery for diverticulitis complains of midabdominal pain. No n/v/d/ or fever. She would like to talk to Dr. Hassell Done, who performed surgery 4 years ago.

## 2021-04-23 NOTE — Discharge Instructions (Signed)
Please read and follow all provided instructions.  Your diagnoses today include:  1. Generalized abdominal pain   2. Bladder infection     Tests performed today include:  Blood cell counts and platelets  Kidney and liver function tests  Pancreas function test (called lipase)  Urine test to look for infection - shows multiple signs of infection, culture sent to confirm  CT scan of the abdomen and pelvis -fortunately does not show any signs of a significant infection or blockage today  Vital signs. See below for your results today.   Medications prescribed:   Keflex (cephalexin) - antibiotic.  Please take 500 mg or 1 tablet every 8 hours for 7 days for urinary tract infection.  You have been prescribed an antibiotic medicine: take the entire course of medicine even if you are feeling better. Stopping early can cause the antibiotic not to work.  Take any prescribed medications only as directed.  Home care instructions:   Follow any educational materials contained in this packet.  Follow-up instructions: Please follow-up with your primary care provider in the next 5 days for further evaluation of your symptoms.    Return instructions:  SEEK IMMEDIATE MEDICAL ATTENTION IF:  The pain does not go away or becomes severe   A temperature above 101F develops   Repeated vomiting occurs (multiple episodes)   The pain becomes localized to portions of the abdomen. The right side could possibly be appendicitis. In an adult, the left lower portion of the abdomen could be colitis or diverticulitis.   Blood is being passed in stools or vomit (bright red or black tarry stools)   You develop chest pain, difficulty breathing, dizziness or fainting, or become confused, poorly responsive, or inconsolable (young children)  If you have any other emergent concerns regarding your health  Additional Information: Abdominal (belly) pain can be caused by many things. Your caregiver performed an  examination and possibly ordered blood/urine tests and imaging (CT scan, x-rays, ultrasound). Many cases can be observed and treated at home after initial evaluation in the emergency department. Even though you are being discharged home, abdominal pain can be unpredictable. Therefore, you need a repeated exam if your pain does not resolve, returns, or worsens. Most patients with abdominal pain don't have to be admitted to the hospital or have surgery, but serious problems like appendicitis and gallbladder attacks can start out as nonspecific pain. Many abdominal conditions cannot be diagnosed in one visit, so follow-up evaluations are very important.  Your vital signs today were: BP (!) 141/74   Pulse 74   Temp (!) 97.3 F (36.3 C) (Oral)   Resp 18   Ht 5' (1.524 m)   Wt 45.4 kg   SpO2 97%   BMI 19.53 kg/m  If your blood pressure (bp) was elevated above 135/85 this visit, please have this repeated by your doctor within one month. --------------

## 2021-04-23 NOTE — ED Provider Notes (Signed)
[facility] Provider Note   CSN: [id number] Arrival date & time: 04/23/21  1115     History Chief Complaint  Patient presents with  . Abdominal Pain    Sara Davila is a 87 y.o. female.  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  Patient states that over the past several weeks she has noted increasing abdominal distention and girth with associated discomfort that is generally in the abdomen.  She continues to pass gas and have bowel movements.  She has not had nausea or vomiting.  She is eating and drinking well.  No chest pain or shortness of breath.  No treatments prior to arrival.  She states that she spoke with her surgery group today and was redirected to the emergency department.        Past Medical History:  Diagnosis Date  . Abdominal pain 02/26/2017  . Anxiety   . Arthritis    Left Knee, Wrist, Back and Hips  . Cervical vertebral fusion   . Diverticulitis   . Diverticulosis   . Fibromyalgia   . GERD (gastroesophageal reflux disease)   . Gout 02/2018   L hand  . Hypertension     after d/c from hospital no current problems or medication  . Hypothyroidism   . Pelvis fracture ([place])   . Peritoneal free air 03/29/2012  . Pneumonia   . Pneumoperitoneum 02/26/2017  . Pre-diabetes   . Seizure ([place])   . Thyroid disorder   . Toe pain 09/06/2016    Patient Active Problem List   Diagnosis Date Noted  . Hyperkalemia 04/02/2021  . Allergic conjunctivitis 03/21/2021  . Osteoarthritis, multiple sites 03/03/2021  . Hyperlipidemia 01/08/2021  . Restless leg syndrome 11/27/2020  . S/P left THA, AA 10/31/2020  . Seizure ([place]) 07/12/2020  . Left-sided weakness 07/11/2020  . History of TIA (transient ischemic attack) 07/11/2020  . TIA (transient ischemic attack)  06/20/2020  . CVA (cerebral vascular accident) ([place]) 06/19/2020  . Asymptomatic bacteriuria 06/19/2020  . HTN (hypertension), benign 06/19/2020  . Thrombocytosis 06/19/2020  . Left hip pain 04/15/2020  . Left knee pain 03/20/2020  . Slow transit constipation 03/20/2020  . Left foot pain 03/18/2020  . Urinary retention 03/06/2020  . Blood loss anemia 03/06/2020  . History of fracture of left hip 03/03/2020  . Prediabetes 01/04/2020  . Memory deficit 01/04/2020  . Osteoporosis 01/04/2020  . History of vasculitis 01/04/2020  . Venous insufficiency 01/04/2020  . Neuropathy 03/30/2018  . Hyponatremia 07/21/2017  . Insomnia secondary to anxiety 07/21/2017  . GERD (gastroesophageal reflux disease) 07/21/2017  . S/P small bowel resection 07/09/2017  . Hypothyroidism 02/26/2017  . Paresthesias 09/06/2016  . Fall 03/29/2012    Past Surgical History:  Procedure Laterality Date  . ABDOMINAL HYSTERECTOMY    . BOWEL RESECTION N/A 07/09/2017   Procedure: SMALL BOWEL RESECTION;  Surgeon: Johnathan Hausen, MD;  Location: [facility];  Service: General;  Laterality: N/A;  . CERVICAL FUSION     x2  . CONVERSION TO TOTAL HIP Left 10/31/2020   Procedure: CONVERSION TO ANTERIOR TOTAL HIP;  Surgeon: Paralee Cancel, MD;  Location: [facility];  Service: Orthopedics;  Laterality: Left;  2 hrs  . FEMUR IM NAIL Left 03/03/2020   Procedure: INTRAMEDULLARY (IM) NAIL FEMORAL;  Surgeon: Paralee Cancel, MD;  Location: [facility];  Service: Orthopedics;  Laterality: Left;  .  LAPAROSCOPIC APPENDECTOMY N/A 03/04/2017   Procedure: APPENDECTOMY LAPAROSCOPIC;  Surgeon: Johnathan Hausen, MD;  Location: [facility];  Service: General;  Laterality: N/A;  . LAPAROSCOPY N/A 03/04/2017   Procedure: LAPAROSCOPY DIAGNOSTIC, ENTEROLYSIS;  Surgeon: Johnathan Hausen, MD;  Location: [facility];  Service: General;  Laterality: N/A;  . LAPAROTOMY N/A 07/09/2017   Procedure: EXPLORATORY LAPAROTOMY;  Surgeon: Johnathan Hausen, MD;  Location: [facility];  Service:  General;  Laterality: N/A;  . SPINE SURGERY     Lumbar- rod placement  . TONSILLECTOMY     87y/o  . TOTAL VAGINAL HYSTERECTOMY       OB History   No obstetric history on file.     Family History  Problem Relation Age of Onset  . Asthma Mother   . Pulmonary embolism Mother   . Macular degeneration Mother   . Heart disease Father   . Stroke Father   . Stroke Paternal Uncle   . Breast cancer Maternal Aunt   . Macular degeneration Sister   . Stroke Sister   . Neuropathy Neg Hx     Social History   Tobacco Use  . Smoking status: Former Research scientist (life sciences)  . Smokeless tobacco: Never Used  . Tobacco comment: Smoked from age [age]  Vaping Use  . Vaping Use: Never used  Substance Use Topics  . Alcohol use: Not Currently    Comment: occasional glass of wine  . Drug use: No    Home Medications Prior to Admission medications   Medication Sig Start Date End Date Taking? Authorizing Provider  acetaminophen (TYLENOL) 500 MG tablet Take 1,000 mg by mouth at bedtime.    [provider]  Calcium Carbonate (CALCIUM 500 PO) Take 500 mg by mouth daily.     [provider]  furosemide (LASIX) 20 MG tablet Take 20 mg by mouth every Monday, Wednesday, and Friday.     [provider]  gabapentin (NEURONTIN) 100 MG capsule Take 100 mg by mouth at bedtime.    [provider]  ibandronate (BONIVA) 150 MG tablet Take 150 mg by mouth every 30 (thirty) days. Take in the morning with a full glass of water, on an empty stomach, and do not take anything else by mouth or lie down for the next 30 min.    [provider]  levETIRAcetam (KEPPRA) 500 MG tablet Take 500 mg by mouth daily.     [provider]  levothyroxine (SYNTHROID) 75 MCG tablet Take 1 tablet (75 mcg total) by mouth daily before breakfast. 06/15/20   [name], MD  LORazepam (ATIVAN) 0.5 MG tablet Take 0.5 tablets (0.25 mg total) by mouth 2 (two) times daily as needed for anxiety. 11/05/20    [name], MD  LORazepam (ATIVAN) 0.5 MG tablet Take 0.5 mg by mouth every 8 (eight) hours.    [provider]  Olopatadine HCl 0.7 % SOLN Apply to eye.    [provider]  omeprazole (PRILOSEC) 20 MG capsule Take 20 mg by mouth daily as needed (indigestion.).     [provider]  polyethylene glycol (MIRALAX / GLYCOLAX) 17 g packet Take 17 g by mouth daily.     [provider]  potassium chloride (MICRO-K) 10 MEQ CR capsule Take 10 mEq by mouth every Monday, Wednesday, and Friday.     [provider]  senna-docusate (SENOKOT-S) 8.6-50 MG tablet Take 2 tablets by mouth 2 (two) times daily. Patient taking differently: Take 1 tablet by mouth 2 (two)  times daily. 03/05/20   Shelly Coss, MD  traMADol Veatrice Bourbon) 50 MG tablet 1 tablet as needed 05/20/18   [provider]  Vitamin D, Cholecalciferol, 50 MCG (2000 UT) CAPS Take 2,000 Units by mouth daily.     [provider]  zolpidem (AMBIEN) 5 MG tablet Take 5 mg by mouth at bedtime as needed for sleep.    [provider]    Allergies    Codeine, Penicillins, and Levaquin [levofloxacin]  Review of Systems   Review of Systems  Constitutional: Negative for fever.  HENT: Negative for rhinorrhea and sore throat.   Eyes: Negative for redness.  Respiratory: Negative for cough.   Cardiovascular: Negative for chest pain.  Gastrointestinal: Positive for abdominal distention and abdominal pain. Negative for diarrhea, nausea and vomiting.  Genitourinary: Negative for dysuria, frequency, hematuria and urgency.  Musculoskeletal: Negative for myalgias.  Skin: Negative for rash.  Neurological: Negative for headaches.    Physical Exam Updated Vital Signs BP 115/75 (BP Location: Left Arm)   Pulse 77   Temp (!) 97.3 F (36.3 C) (Oral)   Resp 18   Ht 5' (1.524 m)   Wt 45.4 kg   SpO2 99%   BMI 19.53 kg/m   Physical Exam Vitals and nursing note reviewed.  Constitutional:       General: She is not in acute distress.    Appearance: She is well-developed.  HENT:     Head: Normocephalic and atraumatic.     Right Ear: External ear normal.     Left Ear: External ear normal.     Nose: Nose normal.  Eyes:     Conjunctiva/sclera: Conjunctivae normal.  Cardiovascular:     Rate and Rhythm: Normal rate and regular rhythm.     Heart sounds: No murmur heard.   Pulmonary:     Effort: No respiratory distress.     Breath sounds: No wheezing, rhonchi or rales.  Abdominal:     General: Bowel sounds are increased. There is distension (Tympanic percussion note throughout).     Palpations: Abdomen is soft.     Tenderness: There is generalized abdominal tenderness (Mild). There is no guarding or rebound.     Comments: Bowel sounds globally increased  Musculoskeletal:     Cervical back: Normal range of motion and neck supple.     Right lower leg: No edema.     Left lower leg: No edema.  Skin:    General: Skin is warm and dry.     Findings: No rash.  Neurological:     General: No focal deficit present.     Mental Status: She is alert. Mental status is at baseline.     Motor: No weakness.  Psychiatric:        Mood and Affect: Mood normal.     ED Results / Procedures / Treatments   Labs (all labs ordered are listed, but only abnormal results are displayed) Labs Reviewed  CBC WITH DIFFERENTIAL/PLATELET - Abnormal; Notable for the following components:      Result Value   RBC 3.70 (*)    Platelets 451 (*)    All other components within normal limits  COMPREHENSIVE METABOLIC PANEL - Abnormal; Notable for the following components:   Sodium 134 (*)    Chloride 97 (*)    All other components within normal limits  URINALYSIS, ROUTINE W REFLEX MICROSCOPIC - Abnormal; Notable for the following components:   APPearance HAZY (*)    Hgb urine dipstick LARGE (*)  Nitrite POSITIVE (*)    Leukocytes,Ua LARGE (*)    RBC / HPF >50 (*)    WBC, UA >50 (*)    Bacteria,  UA RARE (*)    All other components within normal limits  URINE CULTURE    EKG None  Radiology CT ABDOMEN PELVIS WO CONTRAST  Result Date: 04/23/2021 CLINICAL DATA:  87 year old female with abdominal distension and pain. EXAM: CT ABDOMEN AND PELVIS WITHOUT CONTRAST TECHNIQUE: Multidetector CT imaging of the abdomen and pelvis was performed following the standard protocol without IV contrast. COMPARISON:  CT abdomen pelvis dated 02/26/2017. FINDINGS: Evaluation of this exam is limited in the absence of intravenous contrast. Evaluation is also limited due to streak artifact caused by spinal hardware. Lower chest: Bibasilar linear atelectasis/scarring. The visualized lung bases are otherwise clear. No intra-abdominal free air or free fluid. Hepatobiliary: Several small liver cysts. No intrahepatic biliary ductal dilatation. The gallbladder is unremarkable. Pancreas: Unremarkable. No pancreatic ductal dilatation or surrounding inflammatory changes. Spleen: Normal in size without focal abnormality. Adrenals/Urinary Tract: The adrenal glands are unremarkable. There is no hydronephrosis or nephrolithiasis on either side. The visualized ureters appear unremarkable. The urinary bladder is distended and grossly unremarkable. Stomach/Bowel: Large duodenal diverticula measure up to 5 cm. No definite inflammatory changes. There is moderate stool throughout the colon. There is sigmoid diverticulosis without active inflammatory changes. There is no bowel obstruction. Appendectomy. Vascular/Lymphatic: Mild aortoiliac atherosclerotic disease. The IVC is unremarkable. No portal venous gas. There is no adenopathy. Reproductive: Hysterectomy. No adnexal masses. Other: None Musculoskeletal: Osteopenia with scoliosis and degenerative changes of the spine. Extensive lumbar disc spacers and posterior fusion hardware. The hardware appears intact. There is no acute fracture. Total left hip arthroplasty. IMPRESSION: 1. No acute  intra-abdominal or pelvic pathology. 2. Moderate colonic stool burden. No bowel obstruction. 3. Duodenal and sigmoid diverticulosis. 4. Aortic Atherosclerosis (ICD10-I70.0). Electronically Signed   By: Anner Crete M.D.   On: 04/23/2021 14:59    Procedures Procedures   Medications Ordered in ED Medications - No data to display  ED Course  I have reviewed the triage vital signs and the nursing notes.  Pertinent labs & imaging results that were available during my care of the patient were reviewed by me and considered in my medical decision making (see chart for details).  Patient seen and examined. Work-up initiated. Discussed with Dr. Maryan Rued.   Vital signs reviewed and are as follows: BP 115/75 (BP Location: Left Arm)   Pulse 77   Temp (!) 97.3 F (36.3 C) (Oral)   Resp 18   Ht 5' (1.524 m)   Wt 45.4 kg   SpO2 99%   BMI 19.53 kg/m   12:55 PM discussed with rad tech, patient received oral contrast prior to CT.  CT performed and was reassuring.  No signs of intra-abdominal infection or bowel obstruction.  Patient updated on results.  She does have chronic diverticula of both large and small intestines.  Patient updated on results including UA which demonstrates signs of infection.  Patient agrees to treatment with IV Rocephin and discharged home with oral antibiotics.  Urine culture sent.  Patient will be sent home to her assisted living facility with clear discharge instructions.   All results were reviewed with Dr. Maryan Rued.  She agrees with plan.  Patient will be discharged back to her facility.  Encouraged return to the emergency department with development of persistent vomiting, severe pain, fevers.  She is encouraged to follow-up with surgery as needed  as an outpatient.  Encouraged follow-up with her doctor next week for recheck of her urine to ensure clearance of suspected UTI.      MDM Rules/Calculators/A&P                          Patient presents the  emergency department today with generalized abdominal pain and abdominal distention.  This is subacute.  Her work-up was reassuring with normal white blood cell count, liver and kidney function.  Given age and previous surgical history, CT of the abdomen was ordered to evaluate for intra-abdominal infection or obstruction.  Findings as above.  UA did demonstrate signs of infection.  Culture sent and patient treated for UTI.  This may be contributing to her abdominal discomfort.  At this point no indication for admission or emergent surgical consultation.  Encouraged outpatient follow-up to ensure clearance of UTI and surgical recommendations as above.  Strict return instructions discussed with patient.  She looks well at time of discharge.  Abdomen without any rebound or guarding.  She is not vomiting and appears nontoxic.   Final Clinical Impression(s) / ED Diagnoses Final diagnoses:  Generalized abdominal pain  Bladder infection    Rx / DC Orders ED Discharge Orders         Ordered    cephALEXin (KEFLEX) 500 MG capsule  3 times daily,   Status:  Discontinued        04/23/21 1637    cephALEXin (KEFLEX) 500 MG capsule  3 times daily        04/23/21 1639           Carlisle Cater, PA-C 04/23/21 1746    Blanchie Dessert, MD 04/27/21 2051

## 2021-04-25 ENCOUNTER — Non-Acute Institutional Stay: Payer: Medicare Other | Admitting: Nurse Practitioner

## 2021-04-25 DIAGNOSIS — N390 Urinary tract infection, site not specified: Secondary | ICD-10-CM | POA: Insufficient documentation

## 2021-04-25 LAB — URINE CULTURE: Culture: NO GROWTH

## 2021-04-25 NOTE — Progress Notes (Signed)
Location:   [place] Room Number: 856 Place of Service:  ALF (13) Provider: [facility] Truly Stankiewicz NP  [name], MD  Patient Care Team: [name], MD as PCP - General (Internal Medicine)  Extended Emergency Contact Information Primary Emergency Contact: Milon Dikes [place] of [place] Phone: [phone] Mobile Phone: [phone] Relation: Friend Secondary Emergency Contact: Rodman Key Address: [address]          [place] [address] Johnnette Litter of [place] Phone: [phone] Mobile Phone: [phone] Relation: Son  Code Status: DNR Goals of care: Advanced Directive information Advanced Directives 03/21/2021  Does Patient Have a Medical Advance Directive? Yes  Type of Advance Directive Living will;Out of facility DNR (pink MOST or yellow form)  Does patient want to make changes to medical advance directive? No - Patient declined  Copy of [place] in Chart? -  Would patient like information on creating a medical advance directive? -  Pre-existing out of facility DNR order (yellow form or pink MOST form) Pink MOST form placed in chart (order not valid for inpatient use)     Chief Complaint  Patient presents with  . Acute Visit    F/u ED eval    HPI:  Pt is a 87 y.o. female seen today for an acute visit for f/u ED eval 04/23/21 for abd pain, UTI. Unremarkable CBC/diff, CMP, CT abd/pelvis, UA C/S. received Ceftriaxone 1gm IM x1, then followed with Cephalexin, 500mg  tid x 7 days. UA 04/23/21 + nitrite, large leukocytes, >50 rbc, >50 wbc, rare bactreria, pending urine culture.    Past Medical History:  Diagnosis Date  . Abdominal pain 02/26/2017  . Anxiety   . Arthritis    Left Knee, Wrist, Back and Hips  . Cervical vertebral fusion   . Diverticulitis   . Diverticulosis   . Fibromyalgia   . GERD (gastroesophageal reflux disease)   . Gout 02/2018   L hand  . Hypertension     after  d/c from hospital no current problems or medication  . Hypothyroidism   . Pelvis fracture ([place])   . Peritoneal free air 03/29/2012  . Pneumonia   . Pneumoperitoneum 02/26/2017  . Pre-diabetes   . Seizure ([place])   . Thyroid disorder   . Toe pain 09/06/2016   Past Surgical History:  Procedure Laterality Date  . ABDOMINAL HYSTERECTOMY    . BOWEL RESECTION N/A 07/09/2017   Procedure: SMALL BOWEL RESECTION;  Surgeon: Johnathan Hausen, MD;  Location: [facility];  Service: General;  Laterality: N/A;  . CERVICAL FUSION     x2  . CONVERSION TO TOTAL HIP Left 10/31/2020   Procedure: CONVERSION TO ANTERIOR TOTAL HIP;  Surgeon: Paralee Cancel, MD;  Location: [facility];  Service: Orthopedics;  Laterality: Left;  2 hrs  . FEMUR IM NAIL Left 03/03/2020   Procedure: INTRAMEDULLARY (IM) NAIL FEMORAL;  Surgeon: Paralee Cancel, MD;  Location: [facility];  Service: Orthopedics;  Laterality: Left;  . LAPAROSCOPIC APPENDECTOMY N/A 03/04/2017   Procedure: APPENDECTOMY LAPAROSCOPIC;  Surgeon: Johnathan Hausen, MD;  Location: [facility];  Service: General;  Laterality: N/A;  . LAPAROSCOPY N/A 03/04/2017   Procedure: LAPAROSCOPY DIAGNOSTIC, ENTEROLYSIS;  Surgeon: Johnathan Hausen, MD;  Location: [facility];  Service: General;  Laterality: N/A;  . LAPAROTOMY N/A 07/09/2017   Procedure: EXPLORATORY LAPAROTOMY;  Surgeon: Johnathan Hausen, MD;  Location: [facility];  Service: General;  Laterality: N/A;  .  SPINE SURGERY     Lumbar- rod placement  . TONSILLECTOMY     87y/o  . TOTAL VAGINAL HYSTERECTOMY      Allergies  Allergen Reactions  . Codeine Rash  . Penicillins Rash    Has patient had a PCN reaction causing immediate rash, facial/tongue/throat swelling, SOB or lightheadedness with hypotension: No Has patient had a PCN reaction causing severe rash involving mucus membranes or skin necrosis: No Has patient had a PCN reaction that required hospitalization: No Has patient had a PCN reaction occurring within the last 10 years: No If all of the  above answers are "NO", then may proceed with Cephalosporin use.  Tolerated Cephalosporin 10/31/20    . Levaquin [Levofloxacin]     Allergies as of 04/25/2021      Reactions   Codeine Rash   Penicillins Rash   Has patient had a PCN reaction causing immediate rash, facial/tongue/throat swelling, SOB or lightheadedness with hypotension: No Has patient had a PCN reaction causing severe rash involving mucus membranes or skin necrosis: No Has patient had a PCN reaction that required hospitalization: No Has patient had a PCN reaction occurring within the last 10 years: No If all of the above answers are "NO", then may proceed with Cephalosporin use. Tolerated Cephalosporin 10/31/20   Levaquin [levofloxacin]       Medication List       Accurate as of Apr 25, 2021 11:59 PM. If you have any questions, ask your nurse or doctor.        acetaminophen 500 MG tablet Commonly known as: TYLENOL Take 1,000 mg by mouth at bedtime.   CALCIUM 500 PO Take 500 mg by mouth daily.   cephALEXin 500 MG capsule Commonly known as: KEFLEX Take 1 capsule (500 mg total) by mouth 3 (three) times daily.   furosemide 20 MG tablet Commonly known as: LASIX Take 20 mg by mouth every Monday, Wednesday, and Friday.   gabapentin 100 MG capsule Commonly known as: NEURONTIN Take 100 mg by mouth at bedtime.   ibandronate 150 MG tablet Commonly known as: BONIVA Take 150 mg by mouth every 30 (thirty) days. Take in the morning with a full glass of water, on an empty stomach, and do not take anything else by mouth or lie down for the next 30 min.   levETIRAcetam 500 MG tablet Commonly known as: KEPPRA Take 500 mg by mouth daily.   levothyroxine 75 MCG tablet Commonly known as: SYNTHROID Take 1 tablet (75 mcg total) by mouth daily before breakfast.   LORazepam 0.5 MG tablet Commonly known as: ATIVAN Take 0.5 mg by mouth every 8 (eight) hours.   LORazepam 0.5 MG tablet Commonly known as: Ativan Take 0.5  tablets (0.25 mg total) by mouth 2 (two) times daily as needed for anxiety.   Olopatadine HCl 0.7 % Soln Apply to eye.   omeprazole 20 MG capsule Commonly known as: PRILOSEC Take 20 mg by mouth daily as needed (indigestion.).   polyethylene glycol 17 g packet Commonly known as: MIRALAX / GLYCOLAX Take 17 g by mouth daily.   potassium chloride 10 MEQ CR capsule Commonly known as: MICRO-K Take 10 mEq by mouth every Monday, Wednesday, and Friday.   senna-docusate 8.6-50 MG tablet Commonly known as: Senokot-S Take 2 tablets by mouth 2 (two) times daily. What changed: how much to take   traMADol 50 MG tablet Commonly known as: ULTRAM 1 tablet as needed   vitamin D3 50 MCG (2000 UT) Caps Take 2,000  Units by mouth daily.   zolpidem 5 MG tablet Commonly known as: AMBIEN Take 5 mg by mouth at bedtime as needed for sleep.       Review of Systems  Constitutional: Negative for activity change, fatigue and fever.  HENT: Negative for congestion and voice change.   Eyes: Positive for discharge, redness and itching. Negative for visual disturbance.       Due to seasonal allergy  Respiratory: Negative for cough and shortness of breath.        Chronic cough  Cardiovascular: Positive for leg swelling. Negative for palpitations.  Gastrointestinal: Negative for abdominal pain and constipation.  Genitourinary: Negative for dysuria and urgency.       Bathroom trips 1-2/night  Musculoskeletal: Positive for arthralgias and gait problem. Negative for back pain.       Left hip pain, s/p total left hip arthroplasty. Multiple sites aches in am, better as day goes by.   Skin: Negative for color change.  Neurological: Negative for seizures, speech difficulty, weakness and headaches.       Restless legs at night.   Psychiatric/Behavioral: Positive for sleep disturbance. Negative for behavioral problems. The patient is not nervous/anxious.     Immunization History  Administered Date(s)  Administered  . Influenza Split 08/11/2018, 08/03/2019  . Influenza-Unspecified 09/01/2018, 09/10/2020  . Moderna Sars-Covid-2 Vaccination 12/04/2019, 01/01/2020  . Pneumococcal Conjugate-13 05/29/2014  . Pneumococcal Polysaccharide-23 06/14/2015  . Tdap 09/14/2012  . Zoster Recombinat (Shingrix) 07/05/2018  . Zoster, Live 04/30/2004, 04/13/2018, 07/05/2018   Pertinent  Health Maintenance Due  Topic Date Due  . DEXA SCAN  Never done  . INFLUENZA VACCINE  06/30/2021  . PNA vac Low Risk Adult  Completed   Fall Risk  06/14/2020 04/26/2020  Falls in the past year? 0 1  Number falls in past yr: 0 0  Injury with Fall? - 1   Functional Status Survey:    Vitals:   04/25/21 1631  BP: 116/62  Pulse: 66  Resp: 16  Temp: (!) 97.3 F (36.3 C)  SpO2: 96%   There is no height or weight on file to calculate BMI. Physical Exam Constitutional:      Appearance: Normal appearance.  HENT:     Head: Normocephalic and atraumatic.     Mouth/Throat:     Mouth: Mucous membranes are moist.  Eyes:     Pupils: Pupils are equal, round, and reactive to light.     Comments: Watery eyes, sometime itching, redness due to seasonal allergy.   Cardiovascular:     Rate and Rhythm: Normal rate and regular rhythm.     Heart sounds: No murmur heard.     Comments: Weak left DP pulse, more symptomatic tingling sensation at night.  Pulmonary:     Effort: Pulmonary effort is normal.     Breath sounds: No rales.  Abdominal:     General: Bowel sounds are normal.     Palpations: Abdomen is soft.     Tenderness: There is no abdominal tenderness.  Musculoskeletal:        General: No tenderness.     Cervical back: Normal range of motion and neck supple.     Right lower leg: Edema present.     Left lower leg: Edema present.     Comments: Trace edema BLE,  mild scoliosis.   Skin:    General: Skin is warm and dry.  Neurological:     General: No focal deficit present.     Mental  Status: She is alert and  oriented to person, place, and time. Mental status is at baseline.     Gait: Gait abnormal.     Comments: Using walker sometimes.   Psychiatric:        Mood and Affect: Mood normal.        Behavior: Behavior normal.        Thought Content: Thought content normal.        Judgment: Judgment normal.     Labs reviewed: Recent Labs    11/01/20 0315 11/02/20 0351 04/23/21 1228  NA 133* 130* 134*  K 4.1 4.1 4.0  CL 103 99 97*  CO2 23 23 31   GLUCOSE 132* 105* 82  BUN 13 16 20   CREATININE 0.49 0.42* 0.68  CALCIUM 8.1* 8.0* 9.2   Recent Labs    06/19/20 1248 07/11/20 0914 07/25/20 0000 07/30/20 0000 04/23/21 1228  AST 24 24 19 17 20   ALT 14 19 14 13 12   ALKPHOS 55 62 63 66 54  BILITOT 0.6 0.8  --   --  0.7  PROT 6.9 6.8  --   --  7.1  ALBUMIN 3.7 3.9 3.9 4.0 3.9   Recent Labs    11/01/20 0315 11/02/20 0351 11/12/20 0000 11/19/20 0000 04/23/21 1228  WBC 7.6 9.3 8.7 6.8 5.0  NEUTROABS  --   --  5,377.00 3,488.00 2.9  HGB 8.8* 8.6* 10.3* 12.5 12.0  HCT 26.4* 26.0* 30* 36 36.0  MCV 98.1 99.6  --   --  97.3  PLT 289 266 838* 901* 451*   Lab Results  Component Value Date   TSH 2.389 06/19/2020   Lab Results  Component Value Date   HGBA1C 5.8 (H) 10/21/2020   Lab Results  Component Value Date   CHOL 171 06/20/2020   HDL 59 06/20/2020   LDLCALC 94 06/20/2020   TRIG 89 06/20/2020   CHOLHDL 2.9 06/20/2020    Significant Diagnostic Results in last 30 days:  CT ABDOMEN PELVIS WO CONTRAST  Result Date: 04/23/2021 CLINICAL DATA:  87 year old female with abdominal distension and pain. EXAM: CT ABDOMEN AND PELVIS WITHOUT CONTRAST TECHNIQUE: Multidetector CT imaging of the abdomen and pelvis was performed following the standard protocol without IV contrast. COMPARISON:  CT abdomen pelvis dated 02/26/2017. FINDINGS: Evaluation of this exam is limited in the absence of intravenous contrast. Evaluation is also limited due to streak artifact caused by spinal hardware. Lower  chest: Bibasilar linear atelectasis/scarring. The visualized lung bases are otherwise clear. No intra-abdominal free air or free fluid. Hepatobiliary: Several small liver cysts. No intrahepatic biliary ductal dilatation. The gallbladder is unremarkable. Pancreas: Unremarkable. No pancreatic ductal dilatation or surrounding inflammatory changes. Spleen: Normal in size without focal abnormality. Adrenals/Urinary Tract: The adrenal glands are unremarkable. There is no hydronephrosis or nephrolithiasis on either side. The visualized ureters appear unremarkable. The urinary bladder is distended and grossly unremarkable. Stomach/Bowel: Large duodenal diverticula measure up to 5 cm. No definite inflammatory changes. There is moderate stool throughout the colon. There is sigmoid diverticulosis without active inflammatory changes. There is no bowel obstruction. Appendectomy. Vascular/Lymphatic: Mild aortoiliac atherosclerotic disease. The IVC is unremarkable. No portal venous gas. There is no adenopathy. Reproductive: Hysterectomy. No adnexal masses. Other: None Musculoskeletal: Osteopenia with scoliosis and degenerative changes of the spine. Extensive lumbar disc spacers and posterior fusion hardware. The hardware appears intact. There is no acute fracture. Total left hip arthroplasty. IMPRESSION: 1. No acute intra-abdominal or pelvic pathology. 2. Moderate colonic stool burden. No  bowel obstruction. 3. Duodenal and sigmoid diverticulosis. 4. Aortic Atherosclerosis (ICD10-I70.0). Electronically Signed   By: Anner Crete M.D.   On: 04/23/2021 14:59    Assessment/Plan: UTI (urinary tract infection)  f/u ED eval 04/23/21 for abd pain, UTI. Unremarkable CBC/diff, CMP, CT abd/pelvis, UA C/S. received Ceftriaxone 1gm IM x1, then followed with Cephalexin, 500mg  tid x 7 days. UA 04/23/21 + nitrite, large leukocytes, >50 rbc, >50 wbc, rare bactreria, pending urine culture-no growth    Family/ staff Communication: plan of  care reviewed with the patient and charge nurse.   Labs/tests ordered: none  Time spend 40 minutes.

## 2021-04-25 NOTE — Assessment & Plan Note (Addendum)
f/u ED eval 04/23/21 for abd pain, UTI. Unremarkable CBC/diff, CMP, CT abd/pelvis, UA C/S. received Ceftriaxone 1gm IM x1, then followed with Cephalexin, 500mg  tid x 7 days. UA 04/23/21 + nitrite, large leukocytes, >50 rbc, >50 wbc, rare bactreria, pending urine culture-no growth

## 2021-04-29 ENCOUNTER — Encounter: Payer: Self-pay | Admitting: Nurse Practitioner

## 2021-06-19 ENCOUNTER — Non-Acute Institutional Stay: Payer: Medicare Other | Admitting: Orthopedic Surgery

## 2021-06-19 ENCOUNTER — Encounter: Payer: Self-pay | Admitting: Orthopedic Surgery

## 2021-06-19 DIAGNOSIS — M8949 Other hypertrophic osteoarthropathy, multiple sites: Secondary | ICD-10-CM

## 2021-06-19 DIAGNOSIS — F419 Anxiety disorder, unspecified: Secondary | ICD-10-CM | POA: Diagnosis not present

## 2021-06-19 DIAGNOSIS — F5105 Insomnia due to other mental disorder: Secondary | ICD-10-CM

## 2021-06-19 DIAGNOSIS — M159 Polyosteoarthritis, unspecified: Secondary | ICD-10-CM

## 2021-06-19 NOTE — Progress Notes (Signed)
Location:   [facility] Room Number: [place] of Service:  ALF [phone]) Provider:  Windell Moulding, NP    Patient Care Team: [name], MD as PCP - General (Internal Medicine)  Extended Emergency Contact Information Primary Emergency Contact: Milon Dikes [place] Phone: [phone] Mobile Phone: [phone] Relation: Friend Secondary Emergency Contact: Rodman Key Address: [address]          [place] [address] Johnnette Litter of [place] Phone: [phone] Mobile Phone: [phone] Relation: Son  Code Status:  DNR Goals of care: Advanced Directive information Advanced Directives 06/19/2021  Does Patient Have a Medical Advance Directive? Yes  Type of Advance Directive Living will;Out of facility DNR (pink MOST or yellow form)  Does patient want to make changes to medical advance directive? No - Patient declined  Copy of [place] in Chart? -  Would patient like information on creating a medical advance directive? -  Pre-existing out of facility DNR order (yellow form or pink MOST form) Yellow form placed in chart (order not valid for inpatient use);Pink MOST form placed in chart (order not valid for inpatient use)     Chief Complaint  Patient presents with   Acute Visit    Patient presents for anxiety    HPI:  Pt is a 87 y.o. female seen today for an acute visit for anxiety.   Nurse reports she has been refusing to take her Remeron. Umaiza reports ongoing anxiety for years. In addition, she sleeps less and feels less rested. She was started on Remeron 7.5 mg 04/222022 to help with sleep and anxiety. Today, she is requesting to be taken off her medication, she does not believe it has helped. We discussed treatment options, at this time she does not wish to increase Remeron dosage. She denies any recent panic attacks or increased anxiety. In addition, she reports increased  left hip pain s/p total anterior hip replacement 10/2020. She is beginning to have more left hip pain and stiffness in the morning. Currently takes tylenol 1000 mg at bedtime, aleve 220 mg prn, and gabapentin 100 mg q hs for pain. Ambulates with walker. Does not wish to follow up with orthopedic doctor at this time.   Nurse does not report any other concerns, vitals stable.    Past Medical History:  Diagnosis Date   Abdominal pain 02/26/2017   Anxiety    Arthritis    Left Knee, Wrist, Back and Hips   Cervical vertebral fusion    Diverticulitis    Diverticulosis    Fibromyalgia    GERD (gastroesophageal reflux disease)    Gout 02/2018   L hand   Hypertension     after d/c from hospital no current problems or medication   Hypothyroidism    Pelvis fracture ([place])    Peritoneal free air 03/29/2012   Pneumonia    Pneumoperitoneum 02/26/2017   Pre-diabetes    Seizure ([place])    Thyroid disorder    Toe pain 09/06/2016   Past Surgical History:  Procedure Laterality Date   ABDOMINAL HYSTERECTOMY     BOWEL RESECTION N/A 07/09/2017   Procedure: SMALL BOWEL RESECTION;  Surgeon: Johnathan Hausen, MD;  Location: [facility];  Service: General;  Laterality: N/A;   CERVICAL FUSION     x2   CONVERSION TO TOTAL HIP Left 10/31/2020   Procedure: CONVERSION TO ANTERIOR TOTAL HIP;  Surgeon: Paralee Cancel, MD;  Location: [facility];  Service: Orthopedics;  Laterality: Left;  2 hrs   FEMUR IM NAIL Left 03/03/2020   Procedure: INTRAMEDULLARY (IM) NAIL FEMORAL;  Surgeon: Paralee Cancel, MD;  Location: [facility];  Service: Orthopedics;  Laterality: Left;   LAPAROSCOPIC APPENDECTOMY N/A 03/04/2017   Procedure: APPENDECTOMY LAPAROSCOPIC;  Surgeon: Johnathan Hausen, MD;  Location: [facility];  Service: General;  Laterality: N/A;   LAPAROSCOPY N/A 03/04/2017   Procedure: LAPAROSCOPY DIAGNOSTIC, ENTEROLYSIS;  Surgeon: Johnathan Hausen, MD;  Location: [facility];  Service: General;  Laterality: N/A;   LAPAROTOMY N/A 07/09/2017   Procedure:  EXPLORATORY LAPAROTOMY;  Surgeon: Johnathan Hausen, MD;  Location: [facility];  Service: General;  Laterality: N/A;   SPINE SURGERY     Lumbar- rod placement   TONSILLECTOMY     87y/o   TOTAL VAGINAL HYSTERECTOMY      Allergies  Allergen Reactions   Codeine Rash   Penicillins Rash    Has patient had a PCN reaction causing immediate rash, facial/tongue/throat swelling, SOB or lightheadedness with hypotension: No Has patient had a PCN reaction causing severe rash involving mucus membranes or skin necrosis: No Has patient had a PCN reaction that required hospitalization: No Has patient had a PCN reaction occurring within the last 10 years: No If all of the above answers are "NO", then may proceed with Cephalosporin use.  Tolerated Cephalosporin 10/31/20     Levaquin [Levofloxacin]     Allergies as of 06/19/2021       Reactions   Codeine Rash   Penicillins Rash   Has patient had a PCN reaction causing immediate rash, facial/tongue/throat swelling, SOB or lightheadedness with hypotension: No Has patient had a PCN reaction causing severe rash involving mucus membranes or skin necrosis: No Has patient had a PCN reaction that required hospitalization: No Has patient had a PCN reaction occurring within the last 10 years: No If all of the above answers are "NO", then may proceed with Cephalosporin use. Tolerated Cephalosporin 10/31/20   Levaquin [levofloxacin]         Medication List        Accurate as of June 19, 2021 11:15 AM. If you have any questions, ask your nurse or doctor.          STOP taking these medications    cephALEXin 500 MG capsule Commonly known as: KEFLEX Stopped by: Yvonna Alanis, NP   potassium chloride 10 MEQ CR capsule Commonly known as: MICRO-K Stopped by: Yvonna Alanis, NP       TAKE these medications    acetaminophen 500 MG tablet Commonly known as: TYLENOL Take 1,000 mg by mouth at bedtime.   CALCIUM 500 PO Take 500 mg by mouth daily.    furosemide 20 MG tablet Commonly known as: LASIX Take 20 mg by mouth every Monday, Wednesday, and Friday.   gabapentin 100 MG capsule Commonly known as: NEURONTIN Take 100 mg by mouth at bedtime.   ibandronate 150 MG tablet Commonly known as: BONIVA Take 150 mg by mouth every 30 (thirty) days. Take in the morning with a full glass of water, on an empty stomach, and do not take anything else by mouth or lie down for the next 30 min.   levETIRAcetam 500 MG tablet Commonly known as: KEPPRA Take 500 mg by mouth daily.   levothyroxine 75 MCG tablet Commonly known as: SYNTHROID Take 1 tablet (75 mcg total) by mouth daily before breakfast.   LORazepam 0.5 MG  tablet Commonly known as: Ativan Take 0.5 tablets (0.25 mg total) by mouth 2 (two) times daily as needed for anxiety. What changed: Another medication with the same name was removed. Continue taking this medication, and follow the directions you see here. Changed by: Yvonna Alanis, NP   mirtazapine 15 MG tablet Commonly known as: REMERON Take 7.5 mg by mouth daily.   mupirocin ointment 2 % Commonly known as: BACTROBAN Apply 1 application topically 2 (two) times daily.   naproxen sodium 220 MG tablet Commonly known as: ALEVE Take 220 mg by mouth 2 (two) times daily as needed.   Olopatadine HCl 0.7 % Soln Apply to eye.   omeprazole 20 MG capsule Commonly known as: PRILOSEC Take 20 mg by mouth daily as needed (indigestion.).   polyethylene glycol 17 g packet Commonly known as: MIRALAX / GLYCOLAX Take 17 g by mouth daily.   senna-docusate 8.6-50 MG tablet Commonly known as: Senokot-S Take 2 tablets by mouth 2 (two) times daily. What changed: how much to take   traMADol 50 MG tablet Commonly known as: ULTRAM Take 50 mg by mouth every 6 (six) hours as needed.   vitamin D3 50 MCG (2000 UT) Caps Take 2,000 Units by mouth daily.   zolpidem 5 MG tablet Commonly known as: AMBIEN Take 5 mg by mouth at bedtime as needed  for sleep.        Review of Systems  Constitutional:  Negative for activity change, appetite change, fatigue and fever.  Respiratory:  Negative for cough, shortness of breath and wheezing.   Cardiovascular:  Negative for chest pain and leg swelling.  Musculoskeletal:  Positive for arthralgias and gait problem.       Left hip pain  Psychiatric/Behavioral:  Positive for sleep disturbance. Negative for confusion and dysphoric mood. The patient is nervous/anxious.    Immunization History  Administered Date(s) Administered   Influenza Split 08/11/2018, 08/03/2019   Influenza-Unspecified 09/01/2018, 09/10/2020   Moderna Sars-Covid-2 Vaccination 12/04/2019, 01/01/2020   Pneumococcal Conjugate-13 05/29/2014   Pneumococcal Polysaccharide-23 06/14/2015   Tdap 09/14/2012   Zoster Recombinat (Shingrix) 07/05/2018   Zoster, Live 04/30/2004, 04/13/2018, 07/05/2018   Pertinent  Health Maintenance Due  Topic Date Due   DEXA SCAN  Never done   INFLUENZA VACCINE  06/30/2021   PNA vac Low Risk Adult  Completed   Fall Risk  06/14/2020 04/26/2020  Falls in the past year? 0 1  Number falls in past yr: 0 0  Injury with Fall? - 1   Functional Status Survey:    Vitals:   06/19/21 1107  BP: 116/70  Pulse: 64  Resp: 18  Temp: (!) 97.1 F (36.2 C)  SpO2: 95%  Weight: 100 lb 6.4 oz (45.5 kg)  Height: 5' (1.524 m)   Body mass index is 19.61 kg/m. Physical Exam Vitals reviewed.  Constitutional:      General: She is not in acute distress. Cardiovascular:     Rate and Rhythm: Normal rate and regular rhythm.     Pulses: Normal pulses.     Heart sounds: Normal heart sounds. No murmur heard. Pulmonary:     Effort: Pulmonary effort is normal. No respiratory distress.     Breath sounds: Normal breath sounds. No wheezing.  Musculoskeletal:     Left hip: No deformity, tenderness or crepitus. Normal range of motion. Normal strength.     Right lower leg: No edema.     Left lower leg: No edema.   Skin:  General: Skin is warm and dry.     Capillary Refill: Capillary refill takes less than 2 seconds.  Neurological:     General: No focal deficit present.     Mental Status: She is alert and oriented to person, place, and time.     Motor: Weakness present.     Gait: Gait abnormal.     Comments: walker  Psychiatric:        Mood and Affect: Mood normal.        Behavior: Behavior normal.    Labs reviewed: Recent Labs    11/01/20 0315 11/02/20 0351 04/23/21 1228  NA 133* 130* 134*  K 4.1 4.1 4.0  CL 103 99 97*  CO2 23 23 31   GLUCOSE 132* 105* 82  BUN 13 16 20   CREATININE 0.49 0.42* 0.68  CALCIUM 8.1* 8.0* 9.2   Recent Labs    06/19/20 1248 07/11/20 0914 07/25/20 0000 07/30/20 0000 04/23/21 1228  AST 24 24 19 17 20   ALT 14 19 14 13 12   ALKPHOS 55 62 63 66 54  BILITOT 0.6 0.8  --   --  0.7  PROT 6.9 6.8  --   --  7.1  ALBUMIN 3.7 3.9 3.9 4.0 3.9   Recent Labs    11/01/20 0315 11/02/20 0351 11/12/20 0000 11/19/20 0000 04/23/21 1228  WBC 7.6 9.3 8.7 6.8 5.0  NEUTROABS  --   --  5,377.00 3,488.00 2.9  HGB 8.8* 8.6* 10.3* 12.5 12.0  HCT 26.4* 26.0* 30* 36 36.0  MCV 98.1 99.6  --   --  97.3  PLT 289 266 838* 901* 451*   Lab Results  Component Value Date   TSH 2.389 06/19/2020   Lab Results  Component Value Date   HGBA1C 5.8 (H) 10/21/2020   Lab Results  Component Value Date   CHOL 171 06/20/2020   HDL 59 06/20/2020   LDLCALC 94 06/20/2020   TRIG 89 06/20/2020   CHOLHDL 2.9 06/20/2020    Significant Diagnostic Results in last 30 days:  No results found.  Assessment/Plan 1. Insomnia secondary to anxiety - does not believe Remeron was effective, not interested in increasing dosage - discontinue Remeron - cont ativan 0.25 po bid prn for anxiety - cont Ambien 5 mg po qhs - recommend healthy sleeping habits prior to bed- reading, sound machine, listening to calming music   2. Primary osteoarthritis involving multiple joints - reports  increased pain to left hip- s/p LATH 10/2020 - does not wish to f/u with ortho at this time  - will increase tylenol to 1000 mg po q am and q hs - cont aleve 220 mg prn - cont gabapentin 100 mg qhs    Family/ staff Communication: plan discussed with patient and nurse  Labs/tests ordered:  none

## 2021-07-11 ENCOUNTER — Non-Acute Institutional Stay: Payer: Medicare Other | Admitting: Nurse Practitioner

## 2021-07-11 ENCOUNTER — Encounter: Payer: Self-pay | Admitting: Nurse Practitioner

## 2021-07-11 DIAGNOSIS — M159 Polyosteoarthritis, unspecified: Secondary | ICD-10-CM

## 2021-07-11 DIAGNOSIS — E039 Hypothyroidism, unspecified: Secondary | ICD-10-CM

## 2021-07-11 DIAGNOSIS — U071 COVID-19: Secondary | ICD-10-CM | POA: Insufficient documentation

## 2021-07-11 DIAGNOSIS — F5105 Insomnia due to other mental disorder: Secondary | ICD-10-CM

## 2021-07-11 DIAGNOSIS — K5901 Slow transit constipation: Secondary | ICD-10-CM

## 2021-07-11 DIAGNOSIS — E871 Hypo-osmolality and hyponatremia: Secondary | ICD-10-CM

## 2021-07-11 DIAGNOSIS — R569 Unspecified convulsions: Secondary | ICD-10-CM | POA: Diagnosis not present

## 2021-07-11 DIAGNOSIS — G2581 Restless legs syndrome: Secondary | ICD-10-CM | POA: Diagnosis not present

## 2021-07-11 DIAGNOSIS — M8949 Other hypertrophic osteoarthropathy, multiple sites: Secondary | ICD-10-CM

## 2021-07-11 DIAGNOSIS — K219 Gastro-esophageal reflux disease without esophagitis: Secondary | ICD-10-CM

## 2021-07-11 DIAGNOSIS — M8000XS Age-related osteoporosis with current pathological fracture, unspecified site, sequela: Secondary | ICD-10-CM

## 2021-07-11 DIAGNOSIS — I872 Venous insufficiency (chronic) (peripheral): Secondary | ICD-10-CM

## 2021-07-11 DIAGNOSIS — G459 Transient cerebral ischemic attack, unspecified: Secondary | ICD-10-CM

## 2021-07-11 DIAGNOSIS — I10 Essential (primary) hypertension: Secondary | ICD-10-CM

## 2021-07-11 DIAGNOSIS — F419 Anxiety disorder, unspecified: Secondary | ICD-10-CM

## 2021-07-11 LAB — BASIC METABOLIC PANEL
BUN: 11 (ref 4–21)
BUN: 9 (ref 4–21)
CO2: 24 — AB (ref 13–22)
CO2: 25 — AB (ref 13–22)
Chloride: 100 (ref 99–108)
Chloride: 96 — AB (ref 99–108)
Creatinine: 0.6 (ref 0.5–1.1)
Creatinine: 0.7 (ref 0.5–1.1)
Glucose: 121
Glucose: 95
Potassium: 4.3 (ref 3.4–5.3)
Potassium: 4.6 (ref 3.4–5.3)
Sodium: 126 — AB (ref 137–147)
Sodium: 131 — AB (ref 137–147)

## 2021-07-11 LAB — HEPATIC FUNCTION PANEL
ALT: 12 (ref 7–35)
AST: 20 (ref 13–35)
Alkaline Phosphatase: 42 (ref 25–125)
Bilirubin, Total: 0.5

## 2021-07-11 LAB — CBC AND DIFFERENTIAL
HCT: 34 — AB (ref 36–46)
Hemoglobin: 11.6 — AB (ref 12.0–16.0)
Neutrophils Absolute: 5535
Platelets: 389 (ref 150–399)
WBC: 6.8

## 2021-07-11 LAB — COMPREHENSIVE METABOLIC PANEL
Albumin: 3.8 (ref 3.5–5.0)
Calcium: 9 (ref 8.7–10.7)
Globulin: 2.2

## 2021-07-11 LAB — CBC: RBC: 3.65 — AB (ref 3.87–5.11)

## 2021-07-11 NOTE — Assessment & Plan Note (Signed)
off  Atorvastatin, ASA per patient's request.

## 2021-07-11 NOTE — Progress Notes (Signed)
error    This encounter was created in error - please disregard.

## 2021-07-11 NOTE — Assessment & Plan Note (Signed)
Stable, continue takes Omeprazole.

## 2021-07-11 NOTE — Assessment & Plan Note (Signed)
Edema BLE, trace edema ankle/feet, takes Furosemide

## 2021-07-11 NOTE — Assessment & Plan Note (Signed)
Na 134 04/23/21

## 2021-07-11 NOTE — Assessment & Plan Note (Signed)
Seizure, saw neurology 03/03/21, takes Keppra. Hospitalized 8/12-8/19 for seizure with presentation of left sided weakness again. EEG showed focal seizure right frontal region. Takes Keppra '500mg'$  qd.

## 2021-07-11 NOTE — Assessment & Plan Note (Signed)
OP takes Boniva, Ca, pending DEXA

## 2021-07-11 NOTE — Assessment & Plan Note (Signed)
Lower grade fever, congestion, non productive cough, sore throat, fatigue x 2 days, denied chest pain, SOB, palpitation, no O2 desaturation. Tested positive COVID. Will obtain CBC/diff, CMP/eGFR, CXR ap/lateral. Start Paxlovid, Chloraseptic throat spray, DuoNeb q8hr x 72 hours. Observe.

## 2021-07-11 NOTE — Assessment & Plan Note (Signed)
left hip, s/p total left hip, multiple sites, it seems getting worse, usual  Gets better as day goes by,  arthroplasty,  takes Tylenol, Aleve, Gabapentin, Tramadol.

## 2021-07-11 NOTE — Assessment & Plan Note (Signed)
TSH 3.73 01/07/21, takes Levothyroxine

## 2021-07-11 NOTE — Assessment & Plan Note (Signed)
not sleeping well, prn Lorazepam, Zolpidem, failed Mirtazapine 

## 2021-07-11 NOTE — Progress Notes (Signed)
Location:   [facility] Room Number: [place] of Service:  AL[facility] [phone]) Provider:  Kinsley Holderman Otho Darner, NP  [name], MD  Patient Care Team: [name], MD as PCP - General (Internal Medicine)  Extended Emergency Contact Information Primary Emergency Contact: Milon Dikes [place] of [place] Phone: [phone] Mobile Phone: [phone] Relation: Friend Secondary Emergency Contact: Rodman Key Address: [address]          [place] [address] Johnnette Litter of [place] Phone: [phone] Mobile Phone: [phone] Relation: Son  Code Status:  DNR Goals of care: Advanced Directive information Advanced Directives 07/11/2021  Does Patient Have a Medical Advance Directive? Yes  Type of Advance Directive Living will;Out of facility DNR (pink MOST or yellow form)  Does patient want to make changes to medical advance directive? No - Patient declined  Copy of [place] in Chart? -  Would patient like information on creating a medical advance directive? -  Pre-existing out of facility DNR order (yellow form or pink MOST form) Yellow form placed in chart (order not valid for inpatient use);Pink MOST form placed in chart (order not valid for inpatient use)     Chief Complaint  Patient presents with   Acute Visit    Patient presents for fever and is COVID positive    HPI:  Pt is a 87 y.o. female seen today for an acute visit for Lower grade fever, congestion, non productive cough, sore throat, fatigue x 2 days, denied chest pain, SOB, palpitation, no O2 desaturation. Tested positive COVID.   RLS takes Gabapentin,  off Requip.              Edema BLE, trace edema ankle/feet, takes Furosemide             Seizure, saw neurology 03/03/21, takes Keppra. Hospitalized 8/12-8/19 for seizure with presentation of left sided weakness again. EEG showed focal seizure right frontal region. Takes Keppra 58m  qd.              HTN, takes Amlodipine, Furosemide             Hypothyroidism, TSH 3.73 01/07/21, takes Levothyroxine             Hyponatremia, Na 134 04/23/21             Anxiety, not sleeping well, prn Lorazepam, Zolpidem, failed Mirtazapine             GERD, takes Omeprazole.              Constipation, takes MiraLax, Senokot S, Colace             OA, left hip, s/p total left hip, multiple sites, it seems getting worse, usual  Gets better as day goes by,  arthroplasty,  takes Tylenol, Aleve, Gabapentin, Tramadol.              Allergic conjunctivitis, uses Pataday eye drops             OP takes Boniva, Ca, pending DEXA             TIA, off  Atorvastatin, ASA per patient's request.   Past Medical History:  Diagnosis Date   Abdominal pain 02/26/2017   Anxiety    Arthritis    Left Knee, Wrist, Back and Hips   Cervical vertebral fusion    Diverticulitis    Diverticulosis  Fibromyalgia    GERD (gastroesophageal reflux disease)    Gout 02/2018   L hand   Hypertension     after d/c from hospital no current problems or medication   Hypothyroidism    Pelvis fracture ([facility])    Peritoneal free air 03/29/2012   Pneumonia    Pneumoperitoneum 02/26/2017   Pre-diabetes    Seizure ([facility])    Thyroid disorder    Toe pain 09/06/2016   Past Surgical History:  Procedure Laterality Date   ABDOMINAL HYSTERECTOMY     BOWEL RESECTION N/A 07/09/2017   Procedure: SMALL BOWEL RESECTION;  Surgeon: Johnathan Hausen, MD;  Location: [facility];  Service: General;  Laterality: N/A;   CERVICAL FUSION     x2   CONVERSION TO TOTAL HIP Left 10/31/2020   Procedure: CONVERSION TO ANTERIOR TOTAL HIP;  Surgeon: Paralee Cancel, MD;  Location: [facility];  Service: Orthopedics;  Laterality: Left;  2 hrs   FEMUR IM NAIL Left 03/03/2020   Procedure: INTRAMEDULLARY (IM) NAIL FEMORAL;  Surgeon: Paralee Cancel, MD;  Location: [facility];  Service: Orthopedics;  Laterality: Left;   LAPAROSCOPIC APPENDECTOMY N/A 03/04/2017   Procedure:  APPENDECTOMY LAPAROSCOPIC;  Surgeon: Johnathan Hausen, MD;  Location: [facility];  Service: General;  Laterality: N/A;   LAPAROSCOPY N/A 03/04/2017   Procedure: LAPAROSCOPY DIAGNOSTIC, ENTEROLYSIS;  Surgeon: Johnathan Hausen, MD;  Location: [facility];  Service: General;  Laterality: N/A;   LAPAROTOMY N/A 07/09/2017   Procedure: EXPLORATORY LAPAROTOMY;  Surgeon: Johnathan Hausen, MD;  Location: [facility];  Service: General;  Laterality: N/A;   SPINE SURGERY     Lumbar- rod placement   TONSILLECTOMY     87y/o   TOTAL VAGINAL HYSTERECTOMY      Allergies  Allergen Reactions   Codeine Rash   Penicillins Rash    Has patient had a PCN reaction causing immediate rash, facial/tongue/throat swelling, SOB or lightheadedness with hypotension: No Has patient had a PCN reaction causing severe rash involving mucus membranes or skin necrosis: No Has patient had a PCN reaction that required hospitalization: No Has patient had a PCN reaction occurring within the last 10 years: No If all of the above answers are "NO", then may proceed with Cephalosporin use.  Tolerated Cephalosporin 10/31/20     Levaquin [Levofloxacin]     Allergies as of 07/11/2021       Reactions   Codeine Rash   Penicillins Rash   Has patient had a PCN reaction causing immediate rash, facial/tongue/throat swelling, SOB or lightheadedness with hypotension: No Has patient had a PCN reaction causing severe rash involving mucus membranes or skin necrosis: No Has patient had a PCN reaction that required hospitalization: No Has patient had a PCN reaction occurring within the last 10 years: No If all of the above answers are "NO", then may proceed with Cephalosporin use. Tolerated Cephalosporin 10/31/20   Levaquin [levofloxacin]         Medication List        Accurate as of July 11, 2021 11:59 PM. If you have any questions, ask your nurse or doctor.          STOP taking these medications    mirtazapine 15 MG tablet Commonly known as:  REMERON Stopped by: Alani Sabbagh X Ginnie Marich, NP       TAKE these medications    acetaminophen 500 MG tablet Commonly known as: TYLENOL Take 1,000 mg by mouth 2 (two) times daily.   acetaminophen 325 MG tablet Commonly known as: TYLENOL Take  650 mg by mouth every 4 (four) hours as needed.   CALCIUM 500 PO Take 500 mg by mouth daily.   furosemide 20 MG tablet Commonly known as: LASIX Take 20 mg by mouth every Monday, Wednesday, and Friday.   gabapentin 100 MG capsule Commonly known as: NEURONTIN Take 100 mg by mouth at bedtime.   ibandronate 150 MG tablet Commonly known as: BONIVA Take 150 mg by mouth every 30 (thirty) days. Take in the morning with a full glass of water, on an empty stomach, and do not take anything else by mouth or lie down for the next 30 min.   levETIRAcetam 500 MG tablet Commonly known as: KEPPRA Take 500 mg by mouth daily.   levothyroxine 75 MCG tablet Commonly known as: SYNTHROID Take 1 tablet (75 mcg total) by mouth daily before breakfast.   LORazepam 0.5 MG tablet Commonly known as: Ativan Take 0.5 tablets (0.25 mg total) by mouth 2 (two) times daily as needed for anxiety.   mupirocin ointment 2 % Commonly known as: BACTROBAN Apply 1 application topically 2 (two) times daily.   naproxen sodium 220 MG tablet Commonly known as: ALEVE Take 220 mg by mouth 2 (two) times daily as needed.   Olopatadine HCl 0.7 % Soln Apply to eye.   omeprazole 20 MG capsule Commonly known as: PRILOSEC Take 20 mg by mouth daily as needed (indigestion.).   polyethylene glycol 17 g packet Commonly known as: MIRALAX / GLYCOLAX Take 17 g by mouth daily.   senna-docusate 8.6-50 MG tablet Commonly known as: Senokot-S Take 2 tablets by mouth 2 (two) times daily. What changed: how much to take   traMADol 50 MG tablet Commonly known as: ULTRAM Take 50 mg by mouth every 6 (six) hours as needed.   vitamin D3 50 MCG (2000 UT) Caps Take 2,000 Units by mouth daily.    zolpidem 5 MG tablet Commonly known as: AMBIEN Take 5 mg by mouth at bedtime as needed for sleep.        Review of Systems  Constitutional:  Positive for activity change, appetite change, fatigue and fever.  HENT:  Positive for congestion, hearing loss, rhinorrhea, sore throat and voice change. Negative for sinus pressure, sinus pain and trouble swallowing.        Hoarseness voice  Eyes:  Negative for redness and visual disturbance.  Respiratory:  Positive for cough. Negative for chest tightness, shortness of breath and wheezing.        Chronic cough  Cardiovascular:  Positive for leg swelling. Negative for palpitations.  Gastrointestinal:  Negative for abdominal pain and constipation.  Genitourinary:  Negative for dysuria and urgency.       Bathroom trips 1-2/night  Musculoskeletal:  Positive for arthralgias and gait problem. Negative for back pain.       Left hip pain, s/p total left hip arthroplasty. Multiple sites aches in am, better as day goes by.   Skin:  Negative for color change.  Neurological:  Negative for seizures, speech difficulty, weakness and headaches.       Restless legs at night.   Psychiatric/Behavioral:  Positive for sleep disturbance. Negative for behavioral problems. The patient is not nervous/anxious.    Immunization History  Administered Date(s) Administered   Influenza Split 08/11/2018, 08/03/2019   Influenza-Unspecified 09/01/2018, 09/10/2020   Moderna Sars-Covid-2 Vaccination 12/04/2019, 01/01/2020   Pneumococcal Conjugate-13 05/29/2014   Pneumococcal Polysaccharide-23 06/14/2015   Tdap 09/14/2012   Zoster Recombinat (Shingrix) 07/05/2018   Zoster, Live 04/30/2004, 04/13/2018,  07/05/2018   Pertinent  Health Maintenance Due  Topic Date Due   DEXA SCAN  Never done   INFLUENZA VACCINE  06/30/2021   PNA vac Low Risk Adult  Completed   Fall Risk  06/14/2020 04/26/2020  Falls in the past year? 0 1  Number falls in past yr: 0 0  Injury with Fall? - 1    Functional Status Survey:    Vitals:   07/11/21 1328  BP: (!) 160/90  Pulse: 82  Resp: 18  Temp: 99.3 F (37.4 C)  SpO2: 96%  Weight: 100 lb 6.4 oz (45.5 kg)  Height: 5' (1.524 m)   Body mass index is 19.61 kg/m. Physical Exam Constitutional:      Comments: Fatigue, stayed in bed   HENT:     Head: Normocephalic and atraumatic.     Nose: Congestion and rhinorrhea present.     Mouth/Throat:     Mouth: Mucous membranes are moist.     Pharynx: Posterior oropharyngeal erythema present.     Comments: throat Eyes:     Extraocular Movements: Extraocular movements intact.     Pupils: Pupils are equal, round, and reactive to light.     Comments: Watery eyes, sometime itching, redness due to seasonal allergy.   Cardiovascular:     Rate and Rhythm: Normal rate and regular rhythm.     Heart sounds: No murmur heard.    Comments: Weak left DP pulse, more symptomatic tingling sensation at night.  Pulmonary:     Effort: Pulmonary effort is normal.     Breath sounds: No wheezing, rhonchi or rales.  Abdominal:     General: Bowel sounds are normal.     Palpations: Abdomen is soft.     Tenderness: There is no abdominal tenderness.  Musculoskeletal:        General: No tenderness.     Cervical back: Normal range of motion and neck supple.     Right lower leg: Edema present.     Left lower leg: Edema present.     Comments: Trace edema BLE,  mild scoliosis.   Skin:    General: Skin is warm and dry.  Neurological:     General: No focal deficit present.     Mental Status: She is alert and oriented to person, place, and time. Mental status is at baseline.     Gait: Gait abnormal.     Comments: Using walker sometimes.   Psychiatric:        Mood and Affect: Mood normal.        Behavior: Behavior normal.        Thought Content: Thought content normal.        Judgment: Judgment normal.    Labs reviewed: Recent Labs    11/01/20 0315 11/02/20 0351 04/23/21 1228  NA 133* 130* 134*   K 4.1 4.1 4.0  CL 103 99 97*  CO2 '23 23 31  ' GLUCOSE 132* 105* 82  BUN '13 16 20  ' CREATININE 0.49 0.42* 0.68  CALCIUM 8.1* 8.0* 9.2   Recent Labs    07/25/20 0000 07/30/20 0000 04/23/21 1228  AST '19 17 20  ' ALT '14 13 12  ' ALKPHOS 63 66 54  BILITOT  --   --  0.7  PROT  --   --  7.1  ALBUMIN 3.9 4.0 3.9   Recent Labs    11/01/20 0315 11/02/20 0351 11/12/20 0000 11/19/20 0000 04/23/21 1228  WBC 7.6 9.3 8.7 6.8 5.0  NEUTROABS  --   --  5,377.00 3,488.00 2.9  HGB 8.8* 8.6* 10.3* 12.5 12.0  HCT 26.4* 26.0* 30* 36 36.0  MCV 98.1 99.6  --   --  97.3  PLT 289 266 838* 901* 451*   Lab Results  Component Value Date   TSH 2.389 06/19/2020   Lab Results  Component Value Date   HGBA1C 5.8 (H) 10/21/2020   Lab Results  Component Value Date   CHOL 171 06/20/2020   HDL 59 06/20/2020   LDLCALC 94 06/20/2020   TRIG 89 06/20/2020   CHOLHDL 2.9 06/20/2020    Significant Diagnostic Results in last 30 days:  No results found.  Assessment/Plan COVID-19 virus infection Lower grade fever, congestion, non productive cough, sore throat, fatigue x 2 days, denied chest pain, SOB, palpitation, no O2 desaturation. Tested positive COVID. Will obtain CBC/diff, CMP/eGFR, CXR ap/lateral. Start Paxlovid, Chloraseptic throat spray, DuoNeb q8hr x 72 hours. Observe.   Venous insufficiency Edema BLE, trace edema ankle/feet, takes Furosemide  Restless leg syndrome RLS takes Gabapentin,  off Requip.   Seizure [facility])  Seizure, saw neurology 03/03/21, takes Keppra. Hospitalized 8/12-8/19 for seizure with presentation of left sided weakness again. EEG showed focal seizure right frontal region. Takes Keppra 564m qd.   HTN (hypertension), benign Elevated 160/90, denied headache change of vision, chest pain/pressure, will monitor VS shift, takes Amlodipine, Furosemide  Hypothyroidism TSH 3.73 01/07/21, takes Levothyroxine  Hyponatremia Na 134 04/23/21  Insomnia secondary to anxiety not sleeping  well, prn Lorazepam, Zolpidem, failed Mirtazapine  GERD (gastroesophageal reflux disease) Stable, continue takes Omeprazole.   Slow transit constipation takes MiraLax, Senokot S, Colace  Osteoarthritis, multiple sites left hip, s/p total left hip, multiple sites, it seems getting worse, usual  Gets better as day goes by,  arthroplasty,  takes Tylenol, Aleve, Gabapentin, Tramadol.   Osteoporosis OP takes Boniva, Ca, pending DEXA  TIA (transient ischemic attack) off  Atorvastatin, ASA per patient's request.     Family/ staff Communication: plan of care reviewed with the patient and charge nurse.   Labs/tests ordered:  CBC/diff, CMP/eGFR, CXR ap/lateral.   Time spend 40 minutes.

## 2021-07-11 NOTE — Assessment & Plan Note (Signed)
takes MiraLax, Senokot S, Colace

## 2021-07-11 NOTE — Assessment & Plan Note (Signed)
RLS takes Gabapentin,  off Requip.

## 2021-07-11 NOTE — Assessment & Plan Note (Signed)
Elevated 160/90, denied headache change of vision, chest pain/pressure, will monitor VS shift, takes Amlodipine, Furosemide

## 2021-07-15 ENCOUNTER — Encounter: Payer: Self-pay | Admitting: Nurse Practitioner

## 2021-07-16 ENCOUNTER — Non-Acute Institutional Stay: Payer: Medicare Other | Admitting: Nurse Practitioner

## 2021-07-16 ENCOUNTER — Encounter: Payer: Self-pay | Admitting: Nurse Practitioner

## 2021-07-16 DIAGNOSIS — R569 Unspecified convulsions: Secondary | ICD-10-CM

## 2021-07-16 DIAGNOSIS — F419 Anxiety disorder, unspecified: Secondary | ICD-10-CM

## 2021-07-16 DIAGNOSIS — G459 Transient cerebral ischemic attack, unspecified: Secondary | ICD-10-CM

## 2021-07-16 DIAGNOSIS — M159 Polyosteoarthritis, unspecified: Secondary | ICD-10-CM

## 2021-07-16 DIAGNOSIS — I10 Essential (primary) hypertension: Secondary | ICD-10-CM

## 2021-07-16 DIAGNOSIS — U071 COVID-19: Secondary | ICD-10-CM

## 2021-07-16 DIAGNOSIS — K5901 Slow transit constipation: Secondary | ICD-10-CM

## 2021-07-16 DIAGNOSIS — F5105 Insomnia due to other mental disorder: Secondary | ICD-10-CM

## 2021-07-16 DIAGNOSIS — M8949 Other hypertrophic osteoarthropathy, multiple sites: Secondary | ICD-10-CM

## 2021-07-16 DIAGNOSIS — I872 Venous insufficiency (chronic) (peripheral): Secondary | ICD-10-CM

## 2021-07-16 DIAGNOSIS — E871 Hypo-osmolality and hyponatremia: Secondary | ICD-10-CM

## 2021-07-16 DIAGNOSIS — G2581 Restless legs syndrome: Secondary | ICD-10-CM | POA: Diagnosis not present

## 2021-07-16 DIAGNOSIS — E039 Hypothyroidism, unspecified: Secondary | ICD-10-CM

## 2021-07-16 DIAGNOSIS — M8000XS Age-related osteoporosis with current pathological fracture, unspecified site, sequela: Secondary | ICD-10-CM

## 2021-07-16 DIAGNOSIS — K219 Gastro-esophageal reflux disease without esophagitis: Secondary | ICD-10-CM

## 2021-07-16 NOTE — Assessment & Plan Note (Signed)
saw neurology 03/03/21, takes Keppra. Hospitalized 8/12-8/19 for seizure with presentation of left sided weakness again. EEG showed focal seizure right frontal region. Takes Keppra 500mg  qd.

## 2021-07-16 NOTE — Assessment & Plan Note (Signed)
No apparent edema BLE, will hold Furosemide x 1 week in setting of Na 126 07/11/21

## 2021-07-16 NOTE — Assessment & Plan Note (Signed)
Appetite is still poor, encourage oral intake, expected getting better as recovering from COVID

## 2021-07-16 NOTE — Assessment & Plan Note (Signed)
TSH 3.73 01/07/21, takes Levothyroxine

## 2021-07-16 NOTE — Assessment & Plan Note (Signed)
takes MiraLax, Senokot S, Colace

## 2021-07-16 NOTE — Assessment & Plan Note (Signed)
Stable, continue Omeprazole.  

## 2021-07-16 NOTE — Assessment & Plan Note (Signed)
serum Na 126 07/11/21, denied nausea, vomiting, headache, or confusion. Her baseline sodium is 130s. Encourage oral intake, hold Furosemide x 1 week, repeat BMP one week.

## 2021-07-16 NOTE — Assessment & Plan Note (Signed)
not sleeping well, prn Lorazepam, Zolpidem, failed Mirtazapine. Feels anxious since quarantine for COVID, Lorazepam x1 stat.

## 2021-07-16 NOTE — Assessment & Plan Note (Signed)
off  Atorvastatin, ASA per patient's request.

## 2021-07-16 NOTE — Assessment & Plan Note (Signed)
left hip, s/p total left hip, multiple sites, it seems getting worse, usual  Gets better as day goes by,  arthroplasty,  takes Tylenol, Aleve, Gabapentin, Tramadol.

## 2021-07-16 NOTE — Assessment & Plan Note (Signed)
takes Gabapentin,  off Requip. 

## 2021-07-16 NOTE — Progress Notes (Signed)
Location:   [facility] Room Number: [place] of Service:  ALF [phone]) Provider:  Livia Tarr Otho Darner, NP    Patient Care Team: [name], MD as PCP - General (Internal Medicine)  Extended Emergency Contact Information Primary Emergency Contact: Milon Dikes [place] of [place] Phone: [phone] Mobile Phone: [phone] Relation: Friend Secondary Emergency Contact: Rodman Key Address: [address]          [place] [address] Johnnette Litter of [place] Phone: [phone] Mobile Phone: [phone] Relation: Son  Code Status:  DNR Goals of care: Advanced Directive information Advanced Directives 07/16/2021  Does Patient Have a Medical Advance Directive? Yes  Type of Advance Directive Living will;Out of facility DNR (pink MOST or yellow form)  Does patient want to make changes to medical advance directive? No - Patient declined  Copy of [place] in Chart? -  Would patient like information on creating a medical advance directive? -  Pre-existing out of facility DNR order (yellow form or pink MOST form) Yellow form placed in chart (order not valid for inpatient use)     Chief Complaint  Patient presents with   Acute Visit    Patient presents for low sodium    HPI:  Pt is a 87 y.o. female seen today for an acute visit for hyponatremia, serum Na 126 07/11/21, denied nausea, vomiting, headache, or confusion.   COVID, last dose Paxlovid this morning, appetite is still poor.              RLS takes Gabapentin,  off Requip.              Edema BLE, not apparent edema ankle/feet, takes Furosemide             Seizure, saw neurology 03/03/21, takes Keppra. Hospitalized 8/12-8/19 for seizure with presentation of left sided weakness again. EEG showed focal seizure right frontal region. Takes Keppra 519m qd.              HTN, takes Amlodipine, Furosemide, Bun/creat 9/0.70 eGFR 83 07/11/21              Hypothyroidism, TSH 3.73 01/07/21, takes Levothyroxine             Hyponatremia, worsened Na 126 07/11/21             Anxiety, not sleeping well, prn Lorazepam, Zolpidem, failed Mirtazapine             GERD, takes Omeprazole.              Constipation, takes MiraLax, Senokot S, Colace             OA, left hip, s/p total left hip, multiple sites, it seems getting worse, usual  Gets better as day goes by,  arthroplasty,  takes Tylenol, Aleve, Gabapentin, Tramadol.              Allergic conjunctivitis, uses Pataday eye drops             OP takes Boniva, Ca, pending DEXA             TIA, off  Atorvastatin, ASA per patient's request.    Past Medical History:  Diagnosis Date   Abdominal pain 02/26/2017   Anxiety    Arthritis    Left Knee, Wrist, Back and Hips   Cervical vertebral fusion    Diverticulitis  Diverticulosis    Fibromyalgia    GERD (gastroesophageal reflux disease)    Gout 02/2018   L hand   Hypertension     after d/c from hospital no current problems or medication   Hypothyroidism    Pelvis fracture ([place])    Peritoneal free air 03/29/2012   Pneumonia    Pneumoperitoneum 02/26/2017   Pre-diabetes    Seizure ([place])    Thyroid disorder    Toe pain 09/06/2016   Past Surgical History:  Procedure Laterality Date   ABDOMINAL HYSTERECTOMY     BOWEL RESECTION N/A 07/09/2017   Procedure: SMALL BOWEL RESECTION;  Surgeon: Johnathan Hausen, MD;  [facility]: [facility];  Service: General;  Laterality: N/A;   CERVICAL FUSION     x2   CONVERSION TO TOTAL HIP Left 10/31/2020   Procedure: CONVERSION TO ANTERIOR TOTAL HIP;  Surgeon: Paralee Cancel, MD;  [facility]: [facility];  Service: Orthopedics;  Laterality: Left;  2 hrs   FEMUR IM NAIL Left 03/03/2020   Procedure: INTRAMEDULLARY (IM) NAIL FEMORAL;  Surgeon: Paralee Cancel, MD;  [facility];  Service: Orthopedics;  Laterality: Left;   LAPAROSCOPIC APPENDECTOMY N/A 03/04/2017   Procedure: APPENDECTOMY LAPAROSCOPIC;  Surgeon: Johnathan Hausen, MD;   [facility];  Service: General;  Laterality: N/A;   LAPAROSCOPY N/A 03/04/2017   Procedure: LAPAROSCOPY DIAGNOSTIC, ENTEROLYSIS;  Surgeon: Johnathan Hausen, MD;  [facility];  Service: General;  Laterality: N/A;   LAPAROTOMY N/A 07/09/2017   Procedure: EXPLORATORY LAPAROTOMY;  Surgeon: Johnathan Hausen, MD;  [facility];  Service: General;  Laterality: N/A;   SPINE SURGERY     Lumbar- rod placement   TONSILLECTOMY     87y/o   TOTAL VAGINAL HYSTERECTOMY      Allergies  Allergen Reactions   Codeine Rash   Penicillins Rash    Has patient had a PCN reaction causing immediate rash, facial/tongue/throat swelling, SOB or lightheadedness with hypotension: No Has patient had a PCN reaction causing severe rash involving mucus membranes or skin necrosis: No Has patient had a PCN reaction that required hospitalization: No Has patient had a PCN reaction occurring within the last 10 years: No If all of the above answers are "NO", then may proceed with Cephalosporin use.  Tolerated Cephalosporin 10/31/20     Levaquin [Levofloxacin]     Allergies as of 07/16/2021       Reactions   Codeine Rash   Penicillins Rash   Has patient had a PCN reaction causing immediate rash, facial/tongue/throat swelling, SOB or lightheadedness with hypotension: No Has patient had a PCN reaction causing severe rash involving mucus membranes or skin necrosis: No Has patient had a PCN reaction that required hospitalization: No Has patient had a PCN reaction occurring within the last 10 years: No If all of the above answers are "NO", then may proceed with Cephalosporin use. Tolerated Cephalosporin 10/31/20   Levaquin [levofloxacin]         Medication List        Accurate as of July 16, 2021  3:16 PM. If you have any questions, ask your nurse or doctor.          acetaminophen 500 MG tablet Commonly known as: TYLENOL Take 1,000 mg by mouth 2 (two) times daily.   acetaminophen 325 MG  tablet Commonly known as: TYLENOL Take 650 mg by mouth every 4 (four) hours as needed.   CALCIUM 500 PO Take 500 mg by mouth daily.   furosemide 20 MG tablet Commonly  known as: LASIX Take 20 mg by mouth every Monday, Wednesday, and Friday.   gabapentin 100 MG capsule Commonly known as: NEURONTIN Take 100 mg by mouth at bedtime.   ibandronate 150 MG tablet Commonly known as: BONIVA Take 150 mg by mouth every 30 (thirty) days. Take in the morning with a full glass of water, on an empty stomach, and do not take anything else by mouth or lie down for the next 30 min.   levETIRAcetam 500 MG tablet Commonly known as: KEPPRA Take 500 mg by mouth daily.   levothyroxine 75 MCG tablet Commonly known as: SYNTHROID Take 1 tablet (75 mcg total) by mouth daily before breakfast.   LORazepam 0.5 MG tablet Commonly known as: Ativan Take 0.5 tablets (0.25 mg total) by mouth 2 (two) times daily as needed for anxiety.   mupirocin ointment 2 % Commonly known as: BACTROBAN Apply 1 application topically 2 (two) times daily.   naproxen sodium 220 MG tablet Commonly known as: ALEVE Take 220 mg by mouth 2 (two) times daily as needed.   Olopatadine HCl 0.7 % Soln Apply to eye.   omeprazole 20 MG capsule Commonly known as: PRILOSEC Take 20 mg by mouth daily as needed (indigestion.).   polyethylene glycol 17 g packet Commonly known as: MIRALAX / GLYCOLAX Take 17 g by mouth daily.   senna-docusate 8.6-50 MG tablet Commonly known as: Senokot-S Take 2 tablets by mouth 2 (two) times daily. What changed: how much to take   traMADol 50 MG tablet Commonly known as: ULTRAM Take 50 mg by mouth every 6 (six) hours as needed.   vitamin D3 50 MCG (2000 UT) Caps Take 2,000 Units by mouth daily.   zolpidem 5 MG tablet Commonly known as: AMBIEN Take 5 mg by mouth at bedtime as needed for sleep.        Review of Systems  Constitutional:  Positive for appetite change. Negative for activity  change and fever.  HENT:  Positive for hearing loss. Negative for congestion, rhinorrhea, sore throat and trouble swallowing.   Eyes:  Negative for redness and visual disturbance.  Respiratory:  Negative for cough and shortness of breath.        Chronic cough  Cardiovascular:  Negative for leg swelling.  Gastrointestinal:  Negative for abdominal pain and constipation.  Genitourinary:  Negative for dysuria and urgency.       Bathroom trips 1-2/night  Musculoskeletal:  Positive for arthralgias and gait problem. Negative for back pain.       Left hip pain, s/p total left hip arthroplasty. Multiple sites aches in am, better as day goes by.   Skin:  Negative for color change.  Neurological:  Negative for seizures, weakness and headaches.       Restless legs at night.   Psychiatric/Behavioral:  Positive for sleep disturbance. Negative for behavioral problems. The patient is nervous/anxious.    Immunization History  Administered Date(s) Administered   Influenza Split 08/11/2018, 08/03/2019   Influenza-Unspecified 09/01/2018, 09/10/2020   Moderna Sars-Covid-2 Vaccination 12/04/2019, 01/01/2020   Pneumococcal Conjugate-13 05/29/2014   Pneumococcal Polysaccharide-23 06/14/2015   Tdap 09/14/2012   Zoster Recombinat (Shingrix) 07/05/2018   Zoster, Live 04/30/2004, 04/13/2018, 07/05/2018   Pertinent  Health Maintenance Due  Topic Date Due   DEXA SCAN  Never done   INFLUENZA VACCINE  06/30/2021   PNA vac Low Risk Adult  Completed   Fall Risk  06/14/2020 04/26/2020  Falls in the past year? 0 1  Number falls in  past yr: 0 0  Injury with Fall? - 1   Functional Status Survey:    Vitals:   07/16/21 1216  BP: 110/60  Pulse: 78  Resp: 16  Temp: 97.6 F (36.4 C)  SpO2: 96%  Weight: 100 lb 6.4 oz (45.5 kg)  Height: 5' (1.524 m)   Body mass index is 19.61 kg/m. Physical Exam Constitutional:      Appearance: Normal appearance.  HENT:     Head: Normocephalic and atraumatic.     Nose:  No congestion or rhinorrhea.     Mouth/Throat:     Mouth: Mucous membranes are moist.  Eyes:     Extraocular Movements: Extraocular movements intact.     Pupils: Pupils are equal, round, and reactive to light.     Comments: Watery eyes, sometime itching, redness due to seasonal allergy.   Cardiovascular:     Rate and Rhythm: Normal rate and regular rhythm.     Heart sounds: No murmur heard.    Comments: Weak left DP pulse, more symptomatic tingling sensation at night.  Pulmonary:     Effort: Pulmonary effort is normal.     Breath sounds: No rales.  Abdominal:     General: Bowel sounds are normal.     Palpations: Abdomen is soft.     Tenderness: There is no abdominal tenderness.  Musculoskeletal:     Cervical back: Normal range of motion and neck supple.     Right lower leg: No edema.     Left lower leg: No edema.     Comments: mild scoliosis.  Skin:    General: Skin is warm and dry.  Neurological:     General: No focal deficit present.     Mental Status: She is alert and oriented to person, place, and time. Mental status is at baseline.     Gait: Gait abnormal.     Comments: Using walker sometimes.   Psychiatric:        Mood and Affect: Mood normal.        Behavior: Behavior normal.        Thought Content: Thought content normal.        Judgment: Judgment normal.    Labs reviewed: Recent Labs    11/01/20 0315 11/02/20 0351 04/23/21 1228  NA 133* 130* 134*  K 4.1 4.1 4.0  CL 103 99 97*  CO2 '23 23 31  ' GLUCOSE 132* 105* 82  BUN '13 16 20  ' CREATININE 0.49 0.42* 0.68  CALCIUM 8.1* 8.0* 9.2   Recent Labs    07/25/20 0000 07/30/20 0000 04/23/21 1228  AST '19 17 20  ' ALT '14 13 12  ' ALKPHOS 63 66 54  BILITOT  --   --  0.7  PROT  --   --  7.1  ALBUMIN 3.9 4.0 3.9   Recent Labs    11/01/20 0315 11/02/20 0351 11/12/20 0000 11/19/20 0000 04/23/21 1228  WBC 7.6 9.3 8.7 6.8 5.0  NEUTROABS  --   --  5,377.00 3,488.00 2.9  HGB 8.8* 8.6* 10.3* 12.5 12.0  HCT 26.4*  26.0* 30* 36 36.0  MCV 98.1 99.6  --   --  97.3  PLT 289 266 838* 901* 451*   Lab Results  Component Value Date   TSH 2.389 06/19/2020   Lab Results  Component Value Date   HGBA1C 5.8 (H) 10/21/2020   Lab Results  Component Value Date   CHOL 171 06/20/2020   HDL 59 06/20/2020   LDLCALC 94  06/20/2020   TRIG 89 06/20/2020   CHOLHDL 2.9 06/20/2020    Significant Diagnostic Results in last 30 days:  No results found.  Assessment/Plan Hyponatremia serum Na 126 07/11/21, denied nausea, vomiting, headache, or confusion. Her baseline sodium is 130s. Encourage oral intake, hold Furosemide x 1 week, repeat BMP one week.   COVID-19 virus infection Appetite is still poor, encourage oral intake, expected getting better as recovering from COVID  Venous insufficiency No apparent edema BLE, will hold Furosemide x 1 week in setting of Na 126 07/11/21  Restless leg syndrome takes Gabapentin,  off Requip.   Seizure [facility]) saw neurology 03/03/21, takes Keppra. Hospitalized 8/12-8/19 for seizure with presentation of left sided weakness again. EEG showed focal seizure right frontal region. Takes Keppra 530m qd.   HTN (hypertension), benign Blood pressure is controlled, continue Amlodipine. Bun/creat 9/0.70 eGFR 83 07/11/21  Hypothyroidism TSH 3.73 01/07/21, takes Levothyroxine  Insomnia secondary to anxiety not sleeping well, prn Lorazepam, Zolpidem, failed Mirtazapine. Feels anxious since quarantine for COVID, Lorazepam x1 stat.   GERD (gastroesophageal reflux disease) Stable, continue Omeprazole.   Slow transit constipation  takes MiraLax, Senokot S, Colace  Osteoarthritis, multiple sites  left hip, s/p total left hip, multiple sites, it seems getting worse, usual  Gets better as day goes by,  arthroplasty,  takes Tylenol, Aleve, Gabapentin, Tramadol.  Osteoporosis takes Boniva, Ca, pending DEXA  TIA (transient ischemic attack) off  Atorvastatin, ASA per patient's request.      Family/ staff Communication: plan of care reviewed with the patient and charge nurse.   Labs/tests ordered:  BMP one week  Time spend 40 minutes.

## 2021-07-16 NOTE — Assessment & Plan Note (Signed)
Blood pressure is controlled, continue Amlodipine. Bun/creat 9/0.70 eGFR 83 07/11/21

## 2021-07-16 NOTE — Assessment & Plan Note (Signed)
takes Boniva, Ca, pending DEXA

## 2021-07-24 LAB — BASIC METABOLIC PANEL
BUN: 11 (ref 4–21)
CO2: 25 — AB (ref 13–22)
Chloride: 100 (ref 99–108)
Creatinine: 0.6 (ref 0.5–1.1)
Glucose: 95
Potassium: 4.6 (ref 3.4–5.3)
Sodium: 131 — AB (ref 137–147)

## 2021-07-24 LAB — COMPREHENSIVE METABOLIC PANEL: Calcium: 9.1 (ref 8.7–10.7)

## 2021-07-29 ENCOUNTER — Non-Acute Institutional Stay: Payer: Medicare Other | Admitting: Internal Medicine

## 2021-07-29 ENCOUNTER — Encounter: Payer: Self-pay | Admitting: Internal Medicine

## 2021-07-29 DIAGNOSIS — R569 Unspecified convulsions: Secondary | ICD-10-CM

## 2021-07-29 DIAGNOSIS — K219 Gastro-esophageal reflux disease without esophagitis: Secondary | ICD-10-CM

## 2021-07-29 DIAGNOSIS — E871 Hypo-osmolality and hyponatremia: Secondary | ICD-10-CM | POA: Diagnosis not present

## 2021-07-29 DIAGNOSIS — E039 Hypothyroidism, unspecified: Secondary | ICD-10-CM

## 2021-07-29 DIAGNOSIS — M8000XS Age-related osteoporosis with current pathological fracture, unspecified site, sequela: Secondary | ICD-10-CM

## 2021-07-29 DIAGNOSIS — G2581 Restless legs syndrome: Secondary | ICD-10-CM | POA: Diagnosis not present

## 2021-07-29 NOTE — Progress Notes (Signed)
Location:  [facility] Room Number: [place] of Service:  ALF [phone])  Provider: Veleta Miners MD  Code Status: DNR Managed Care Goals of Care:  Advanced Directives 07/16/2021  Does Patient Have a Medical Advance Directive? Yes  Type of Advance Directive Living will;Out of facility DNR (pink MOST or yellow form)  Does patient want to make changes to medical advance directive? No - Patient declined  Copy of [place] in Chart? -  Would patient like information on creating a medical advance directive? -  Pre-existing out of facility DNR order (yellow form or pink MOST form) Yellow form placed in chart (order not valid for inpatient use)     Chief Complaint  Patient presents with   Medical Management of Chronic Issues   Quality Metric Gaps    Dexa scan, Shingrix, #3 Covid, flu shot    HPI: Patient is a 87 y.o. female seen today for medical management of chronic diseases.   She also has h/o hypothyroidism, Osteoporosis, Arthritis and GERD and Depression and Anxiety also Insomnia. A h/o Seizures with Left sided weakness left total hip arthroplasty with anterior approach on 12/02  Recent Covid Infection Treated with Paxlovid  and Hyponatremia Doing well now [facility] with her walker No seizures  Pian Controlled with Tylenol and Neurontin Wants Tylenol dose reduced. Appetite is good Weight is stable     Past Medical History:  Diagnosis Date   Abdominal pain 02/26/2017   Anxiety    Arthritis    Left Knee, Wrist, Back and Hips   Cervical vertebral fusion    Diverticulitis    Diverticulosis    Fibromyalgia    GERD (gastroesophageal reflux disease)    Gout 02/2018   L hand   Hypertension     after d/c from hospital no current problems or medication   Hypothyroidism    Pelvis fracture ([place])    Peritoneal free air 03/29/2012   Pneumonia    Pneumoperitoneum 02/26/2017   Pre-diabetes    Seizure ([place])    Thyroid disorder    Toe pain  09/06/2016    Past Surgical History:  Procedure Laterality Date   ABDOMINAL HYSTERECTOMY     BOWEL RESECTION N/A 07/09/2017   Procedure: SMALL BOWEL RESECTION;  Surgeon: Johnathan Hausen, MD;  [facility];  Service: General;  Laterality: N/A;   CERVICAL FUSION     x2   CONVERSION TO TOTAL HIP Left 10/31/2020   Procedure: CONVERSION TO ANTERIOR TOTAL HIP;  Surgeon: Paralee Cancel, MD;  [facility];  Service: Orthopedics;  Laterality: Left;  2 hrs   FEMUR IM NAIL Left 03/03/2020   Procedure: INTRAMEDULLARY (IM) NAIL FEMORAL;  Surgeon: Paralee Cancel, MD;  [facility];  Service: Orthopedics;  Laterality: Left;   LAPAROSCOPIC APPENDECTOMY N/A 03/04/2017   Procedure: APPENDECTOMY LAPAROSCOPIC;  Surgeon: Johnathan Hausen, MD;  [facility];  Service: General;  Laterality: N/A;   LAPAROSCOPY N/A 03/04/2017   Procedure: LAPAROSCOPY DIAGNOSTIC, ENTEROLYSIS;  Surgeon: Johnathan Hausen, MD;  [facility];  Service: General;  Laterality: N/A;   LAPAROTOMY N/A 07/09/2017   Procedure: EXPLORATORY LAPAROTOMY;  Surgeon: Johnathan Hausen, MD;  [facility];  Service: General;  Laterality: N/A;   SPINE SURGERY     Lumbar- rod placement   TONSILLECTOMY     87y/o   TOTAL VAGINAL HYSTERECTOMY      Allergies  Allergen Reactions   Codeine Rash   Penicillins Rash    Has  patient had a PCN reaction causing immediate rash, facial/tongue/throat swelling, SOB or lightheadedness with hypotension: No Has patient had a PCN reaction causing severe rash involving mucus membranes or skin necrosis: No Has patient had a PCN reaction that required hospitalization: No Has patient had a PCN reaction occurring within the last 10 years: No If all of the above answers are "NO", then may proceed with Cephalosporin use.  Tolerated Cephalosporin 10/31/20     Levaquin [Levofloxacin]     Outpatient Encounter Medications as of 07/29/2021  Medication Sig   acetaminophen (TYLENOL) 325 MG tablet Take 650 mg by mouth  every 4 (four) hours as needed.   acetaminophen (TYLENOL) 500 MG tablet Take 1,000 mg by mouth 2 (two) times daily.   Calcium Carbonate (CALCIUM 500 PO) Take 500 mg by mouth daily.    furosemide (LASIX) 20 MG tablet Take 20 mg by mouth every Monday, Wednesday, and Friday.    gabapentin (NEURONTIN) 100 MG capsule Take 100 mg by mouth at bedtime.   ibandronate (BONIVA) 150 MG tablet Take 150 mg by mouth every 30 (thirty) days. Take in the morning with a full glass of water, on an empty stomach, and do not take anything else by mouth or lie down for the next 30 min.   levETIRAcetam (KEPPRA) 500 MG tablet Take 500 mg by mouth daily.    levothyroxine (SYNTHROID) 75 MCG tablet Take 1 tablet (75 mcg total) by mouth daily before breakfast.   LORazepam (ATIVAN) 0.5 MG tablet Take 0.5 tablets (0.25 mg total) by mouth 2 (two) times daily as needed for anxiety.   mupirocin ointment (BACTROBAN) 2 % Apply 1 application topically 2 (two) times daily.   naproxen sodium (ALEVE) 220 MG tablet Take 220 mg by mouth 2 (two) times daily as needed.   Olopatadine HCl 0.7 % SOLN Apply to eye.   omeprazole (PRILOSEC) 20 MG capsule Take 20 mg by mouth daily as needed (indigestion.).    polyethylene glycol (MIRALAX / GLYCOLAX) 17 g packet Take 17 g by mouth daily.    senna-docusate (SENOKOT-S) 8.6-50 MG tablet Take 2 tablets by mouth 2 (two) times daily. (Patient taking differently: Take 1 tablet by mouth 2 (two) times daily.)   traMADol (ULTRAM) 50 MG tablet Take 50 mg by mouth every 6 (six) hours as needed.   Vitamin D, Cholecalciferol, 50 MCG (2000 UT) CAPS Take 2,000 Units by mouth daily.    zolpidem (AMBIEN) 5 MG tablet Take 5 mg by mouth at bedtime as needed for sleep.   No facility-administered encounter medications on file as of 07/29/2021.    Review of Systems:  Review of Systems Review of Systems  Constitutional: Negative for activity change, appetite change, chills, diaphoresis, fatigue and fever.  HENT:  Negative for mouth sores, postnasal drip, rhinorrhea, sinus pain and sore throat.   Respiratory: Negative for apnea, cough, chest tightness, shortness of breath and wheezing.   Cardiovascular: Negative for chest pain, palpitations and leg swelling.  Gastrointestinal: Negative for abdominal distention, abdominal pain, constipation, diarrhea, nausea and vomiting.  Genitourinary: Negative for dysuria and frequency.  Musculoskeletal: Negative for arthralgias, joint swelling and myalgias.  Skin: Negative for rash.  Neurological: Negative for dizziness, syncope, weakness, light-headedness and numbness.  Psychiatric/Behavioral: Negative for behavioral problems, confusion and sleep disturbance.    Health Maintenance  Topic Date Due   DEXA SCAN  Never done   Zoster Vaccines- Shingrix (2 of 2) 08/30/2018   COVID-19 Vaccine (3 - Booster for Moderna series) 05/30/2020  INFLUENZA VACCINE  06/30/2021   TETANUS/TDAP  09/14/2022   PNA vac Low Risk Adult  Completed   HPV VACCINES  Aged Out    Physical Exam: Vitals:   07/29/21 1148  BP: 118/81  Pulse: 76  Resp: 18  Temp: 98.3 F (36.8 C)  SpO2: 96%  Weight: 100 lb 6.4 oz (45.5 kg)  Height: 5' (1.524 m)   Body mass index is 19.61 kg/m. Physical Exam Constitutional: Oriented to person, place, and time. Well-developed and well-nourished.  HENT:  Head: Normocephalic.  Mouth/Throat: Oropharynx is clear and moist.  Eyes: Pupils are equal, round, and reactive to light.  Neck: Neck supple.  Cardiovascular: Normal rate and normal heart sounds.  No murmur heard. Pulmonary/Chest: Effort normal and breath sounds normal. No respiratory distress. No wheezes. She has no rales.  Abdominal: Soft. Bowel sounds are normal. No distension. There is no tenderness. There is no rebound.  Musculoskeletal: Mild Edema Bilateral  Lymphadenopathy: none Neurological: Alert and oriented to person, place, and time. No Focal Deficits Skin: Skin is warm and dry.   Psychiatric: Normal mood and affect. Behavior is normal. Thought content normal.   Labs reviewed: Basic Metabolic Panel: Recent Labs    11/01/20 0315 11/02/20 0351 04/23/21 1228  NA 133* 130* 134*  K 4.1 4.1 4.0  CL 103 99 97*  CO2 '23 23 31  '$ GLUCOSE 132* 105* 82  BUN '13 16 20  '$ CREATININE 0.49 0.42* 0.68  CALCIUM 8.1* 8.0* 9.2   Liver Function Tests: Recent Labs    07/30/20 0000 04/23/21 1228  AST 17 20  ALT 13 12  ALKPHOS 66 54  BILITOT  --  0.7  PROT  --  7.1  ALBUMIN 4.0 3.9   No results for input(s): LIPASE, AMYLASE in the last 8760 hours. No results for input(s): AMMONIA in the last 8760 hours. CBC: Recent Labs    11/01/20 0315 11/02/20 0351 11/12/20 0000 11/19/20 0000 04/23/21 1228  WBC 7.6 9.3 8.7 6.8 5.0  NEUTROABS  --   --  5,377.00 3,488.00 2.9  HGB 8.8* 8.6* 10.3* 12.5 12.0  HCT 26.4* 26.0* 30* 36 36.0  MCV 98.1 99.6  --   --  97.3  PLT 289 266 838* 901* 451*   Lipid Panel: No results for input(s): CHOL, HDL, LDLCALC, TRIG, CHOLHDL, LDLDIRECT in the last 8760 hours. Lab Results  Component Value Date   HGBA1C 5.8 (H) 10/21/2020    Procedures since last visit: No results found.  Assessment/Plan Hyponatremia Sodium is good levels now  Seizure ([place]) On Keperra. No recurrence since then  Hypothyroidism, unspecified type TSH normal in 3/22  Restless leg syndrome Was started on Neurontin Dose is now lower but doing well  Age-related osteoporosis with current pathological fracture, sequela On Bon[place] repeat DEXA  Patient refused to Repeat DEXA  Gastroesophageal reflux disease without esophagitis On Prilosec PRN HLD LDL more then 100 Last check was 112 She is not intereseted in [place]  Insomnia with Anxiety On Ativan and Ambien PRn  S/p Left Hip arthroplasty Doing well walking with her walker  Labs/tests ordered:  * No order type specified * Next appt:  Visit date not found

## 2021-09-04 ENCOUNTER — Ambulatory Visit (INDEPENDENT_AMBULATORY_CARE_PROVIDER_SITE_OTHER): Payer: Medicare Other | Admitting: Adult Health

## 2021-09-04 ENCOUNTER — Other Ambulatory Visit: Payer: Self-pay

## 2021-09-04 ENCOUNTER — Encounter: Payer: Self-pay | Admitting: Adult Health

## 2021-09-04 VITALS — BP 120/68 | HR 79 | Ht 59.0 in | Wt 105.0 lb

## 2021-09-04 DIAGNOSIS — R269 Unspecified abnormalities of gait and mobility: Secondary | ICD-10-CM | POA: Diagnosis not present

## 2021-09-04 DIAGNOSIS — R569 Unspecified convulsions: Secondary | ICD-10-CM | POA: Diagnosis not present

## 2021-09-04 DIAGNOSIS — G459 Transient cerebral ischemic attack, unspecified: Secondary | ICD-10-CM

## 2021-09-04 DIAGNOSIS — G5793 Unspecified mononeuropathy of bilateral lower limbs: Secondary | ICD-10-CM

## 2021-09-04 NOTE — Progress Notes (Signed)
[facility] [address] [place]. [place] [address] [phone]       STROKE FOLLOW UP NOTE  Sara. Sara Davila Date of Birth:  May 19, 1930 Medical Record Number:  [id number]   Reason for Referral: stroke and seizure follow up    SUBJECTIVE:   CHIEF COMPLAINT:  Chief Complaint  Patient presents with   Follow-up    Rm 2 alone  Pt is well, no seizures or new TIA symptoms. Does states she is declining, bares to the L when walking, and unstable a lot.      HPI:   Today, 09/04/2021, Sara Davila returns for 2-month stroke and seizure follow-up unaccompanied.  Overall doing well from neurological standpoint.  Remains on Keppra XR 500 mg nightly without recurrent seizure activity and denies side effects.  Denies new or reoccurring stroke/TIA symptoms.  Continues to decline antithrombotics or statins despite extensive education at prior visit.  Blood pressure today 120/68.  Prior complaints of neuropathy started on gabapentin with great benefit. Very mild residual symptoms.   She does feel as though her overall gait has been slowly declining with increased left knee pain.  She continues to do exercises as advised by therapy and participates in exercise classes 3 days weekly at [facility]  Normally will use rolling walker and denies any recent falls.  No further concerns at this time   History provided for reference purposes only Update 03/03/2021 JM: Sara Davila returns for stroke and seizure follow-up after prior visit approximately 8 months ago.  She is unaccompanied at today's visit  Stable since prior visit without new or recurring stroke/TIA symptoms Personally reviewed [facility] notes -aspirin and atorvastatin d/c'd per patient request as she had difficulty tolerating and episodes were not felt to be TIA or stroke and more so seizures  Blood pressure today 140/83  Denies any additional seizure activity Currently on Keppra XR 500 mg nightly tolerating  without side effects Difficulty tolerating higher dosages w/ c/o hallucinations and fatigue  S/p L conversion to anterior total hip 10/2020 by Dr. Alvan Dame without complication  She also mentions RLS currently on Requip without much benefit -describes symptoms as her feet are constantly moving (toe tapping like movement) but denies any pain or a feeling as though she needs to move her legs; does not improve with ambulation or movement Also reports chronic history of neuropathy in BLE first starting in her R big toe and then eventually L big toe -currently neuropathy in toes and bottom of feet -describes burning type sensation especially at night interfering with sleep  No further concerns at this time  Initial visit 07/25/2020 JM: Sara Davila is being seen for hospital follow-up accompanied by transportation [facility]  Since discharge, she presented to ED on 07/11/2020 with recurrence of left-sided weakness, left hemianopia along with severe frontal headache associated with photophobia and nausea the morning 4 mg dose home prior.  Symptoms apparently resolved after 20 minutes.  Neurology consulted who noted altered mental status with EEG reporting evidence of epileptogenicity arising from the right frontal region as well as cortical dysfunction in the right hemisphere likely secondary to underlying structural abnormality, postictal state.  MRI negative for acute abnormality.  Initiated Keppra 750 mg twice daily for new onset seizures.  Noted to be weak and deconditioned with PT recommending CIR but unfortunately insurance denied and discharged back to ALF with [facility] services.  She has been doing well since discharge without reoccurring  left-sided weakness, visual changes or headache.  She does report concerns of visual hallucinations in her left periphery but when she would look straight on to image, she would no longer see image.  She denies visual impairment or deficit.  There  was concern for keppra side effect from SNF provider therefore dosage lowered to 500 mg twice daily with improvement of her hallucinations.  She denies any other side effect or intolerance  She has remained on aspirin and atorvastatin for secondary stroke prevention without side effects Blood pressure today 110/69 HTN and HLD managed by facility  No further concerns at this time  Hospital admission 06/19/2020 Sara Davila is a 87 y.o. female with history of TIA and HTN who presented on 06/19/2020 with L sided weakness.   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  Recommended DAPT for questions weeks and aspirin alone.  HTN stable.  LDL 94 initiate atorvastatin 20 mg daily.  No history of DM but A1c 6.1 indicating prediabetes.  Other stroke risk factors include advanced age, former tobacco use, EtOH use, hx TIA per pt, and family history of stroke.  Evaluated by therapy without therapy needs and discharged back home with plans on moving to ALF the following day.  R brain TIA likely from small vessel disease CT head No acute abnormality. Small vessel disease. Atrophy.  CTA head & neck no ELVO. L ICA bifurcation w/ minimal atherosclerosis. B cervical ICA tortuosity. Grade 2 anterolisthesis C7-11, CS fused above this level MRI  No acute abnormality 2D Echo  normal LDL 94 HgbA1c 6.1 VTE prophylaxis - Lovenox 30 mg sq daily  No antithrombotic prior to admission, now on aspirin 325 mg daily. Decrease aspirin to 81 and add plavix 75 mg daily. Continue DAPT x 3 weeks then aspirin alone.  Therapy recommendations:  ALF  Disposition:  Return to [facility] - from [facility] apt w/ plans to move to ALF tomorrow at [place]:   14 system review of systems performed and negative with exception of those listed in HPI  PMH:  Past Medical History:  Diagnosis Date   Abdominal pain 02/26/2017   Anxiety    Arthritis    Left Knee, Wrist,  Back and Hips   Cervical vertebral fusion    Diverticulitis    Diverticulosis    Fibromyalgia    GERD (gastroesophageal reflux disease)    Gout 02/2018   L hand   Hypertension     after d/c from hospital no current problems or medication   Hypothyroidism    Pelvis fracture ([place])    Peritoneal free air 03/29/2012   Pneumonia    Pneumoperitoneum 02/26/2017   Pre-diabetes    Seizure ([place])    Thyroid disorder    Toe pain 09/06/2016    PSH:  Past Surgical History:  Procedure Laterality Date   ABDOMINAL HYSTERECTOMY     BOWEL RESECTION N/A 07/09/2017   Procedure: SMALL BOWEL RESECTION;  Surgeon: Johnathan Hausen, MD;  Location: [facility];  Service: General;  Laterality: N/A;   CERVICAL FUSION     x2   CONVERSION TO TOTAL HIP Left 10/31/2020   Procedure: CONVERSION TO ANTERIOR TOTAL HIP;  Surgeon: Paralee Cancel, MD;  Location: [facility];  Service: Orthopedics;  Laterality: Left;  2 hrs   FEMUR IM NAIL Left 03/03/2020   Procedure: INTRAMEDULLARY (IM) NAIL FEMORAL;  Surgeon: Paralee Cancel, MD;  Location: [facility];  Service:  Orthopedics;  Laterality: Left;   LAPAROSCOPIC APPENDECTOMY N/A 03/04/2017   Procedure: APPENDECTOMY LAPAROSCOPIC;  Surgeon: Johnathan Hausen, MD;  Location: [facility];  Service: General;  Laterality: N/A;   LAPAROSCOPY N/A 03/04/2017   Procedure: LAPAROSCOPY DIAGNOSTIC, ENTEROLYSIS;  Surgeon: Johnathan Hausen, MD;  Location: [facility];  Service: General;  Laterality: N/A;   LAPAROTOMY N/A 07/09/2017   Procedure: EXPLORATORY LAPAROTOMY;  Surgeon: Johnathan Hausen, MD;  Location: [facility];  Service: General;  Laterality: N/A;   SPINE SURGERY     Lumbar- rod placement   TONSILLECTOMY     87y/o   TOTAL VAGINAL HYSTERECTOMY      Social History:  Social History   Socioeconomic History   Marital status: Widowed    Spouse name: Not on file   Number of children: 2   Years of education: RN   Highest education level: Not on file  Occupational History   Occupation: Retired   Tobacco Use    Smoking status: Former   Smokeless tobacco: Never   Tobacco comments:    Smoked from age [age]  Vaping Use   Vaping Use: Never used  Substance and Sexual Activity   Alcohol use: Not Currently    Comment: occasional glass of wine   Drug use: No   Sexual activity: Not Currently  Other Topics Concern   Not on file  Social History Narrative   Lives at [facility]    Caffeine use: occasional cup of coffee, 2-3 times a week   Left handed         Diet: Regular      Do you drink/ eat things with caffeine? No      Marital status: Widowed                              What year were you married ? 1986      Do you live in a house, apartment,assistred living, condo, trailer, etc.)? [place]       Is it one or more stories? 4 th floor      How many persons live in your home ? 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  Yes                            Type & how often 5-6 x WK      ADVANCED DIRECTIVES (Please bring copies)      Do you have a living will? Yes      Do you have a DNR form?   Yes                    If not, do you want to discuss one?       Do you have signed POA?HPOA forms?   Yes              If so, please bring to your appointment      FUNCTIONAL STATUS- To be completed by Spouse / child / Staff       Do you have difficulty bathing or dressing yourself ? No      Do you have difficulty  preparing food or eating ?  No      Do you have difficulty managing your mediation ?No      Do you have difficulty managing your finances ? No      Do you have difficulty affording your medication ? No      Social Determinants of Radio broadcast assistant Strain: Not on file  Food Insecurity: Not on file  Transportation Needs: Not on file  Physical Activity: Not on file  Stress: Not on file   Social Connections: Not on file  Intimate Partner Violence: Not on file    Family History:  Family History  Problem Relation Age of Onset   Asthma Mother    Pulmonary embolism Mother    Macular degeneration Mother    Heart disease Father    Stroke Father    Stroke Paternal Uncle    Breast cancer Maternal Aunt    Macular degeneration Sister    Stroke Sister    Neuropathy Neg Hx     Medications:   Current Outpatient Medications on File Prior to Visit  Medication Sig Dispense Refill   acetaminophen (TYLENOL) 500 MG tablet Take 1,000 mg by mouth 2 (two) times daily.     Calcium Carbonate (CALCIUM 500 PO) Take 500 mg by mouth daily.      furosemide (LASIX) 20 MG tablet Take 20 mg by mouth every Monday, Wednesday, and Friday.      gabapentin (NEURONTIN) 100 MG capsule Take 100 mg by mouth at bedtime.     ibandronate (BONIVA) 150 MG tablet Take 150 mg by mouth every 30 (thirty) days. Take in the morning with a full glass of water, on an empty stomach, and do not take anything else by mouth or lie down for the next 30 min.     levETIRAcetam (KEPPRA) 500 MG tablet Take 500 mg by mouth daily.      levothyroxine (SYNTHROID) 75 MCG tablet Take 1 tablet (75 mcg total) by mouth daily before breakfast. 30 tablet 1   LORazepam (ATIVAN) 0.5 MG tablet Take 0.5 tablets (0.25 mg total) by mouth 2 (two) times daily as needed for anxiety. 30 tablet 0   mupirocin ointment (BACTROBAN) 2 % Apply 1 application topically 2 (two) times daily.     naproxen sodium (ALEVE) 220 MG tablet Take 220 mg by mouth 2 (two) times daily as needed.     Olopatadine HCl 0.7 % SOLN Apply to eye.     omeprazole (PRILOSEC) 20 MG capsule Take 20 mg by mouth daily as needed (indigestion.).      phenol (CHLORASEPTIC) 1.4 % LIQD Use as directed 1 spray in the mouth or throat every 4 (four) hours as needed for throat irritation / pain.     polyethylene glycol (MIRALAX / GLYCOLAX) 17 g packet Take 17 g by mouth daily.       sennosides-docusate sodium (SENOKOT-S) 8.6-50 MG tablet Take 1 tablet by mouth at bedtime as needed for constipation.     traMADol (ULTRAM) 50 MG tablet Take 50 mg by mouth every 6 (six) hours as needed.     Vitamin D, Cholecalciferol, 50 MCG (2000 UT) CAPS Take 2,000 Units by mouth daily.      zolpidem (AMBIEN) 5 MG tablet Take 5 mg by mouth at bedtime as needed for sleep.     No current facility-administered medications on file prior to visit.    Allergies:   Allergies  Allergen Reactions   Codeine Rash  Penicillins Rash    Has patient had a PCN reaction causing immediate rash, facial/tongue/throat swelling, SOB or lightheadedness with hypotension: No Has patient had a PCN reaction causing severe rash involving mucus membranes or skin necrosis: No Has patient had a PCN reaction that required hospitalization: No Has patient had a PCN reaction occurring within the last 10 years: No If all of the above answers are "NO", then may proceed with Cephalosporin use.  Tolerated Cephalosporin 10/31/20     Levaquin [Levofloxacin]       OBJECTIVE:  Physical Exam  Vitals:   09/04/21 0936  BP: 120/68  Pulse: 79  Weight: 105 lb (47.6 kg)  Height: 4\' 11"  (1.499 m)    Body mass index is 21.21 kg/m. No results found.  General: Frail very pleasant elderly Caucasian female, seated, in no evident distress Head: head normocephalic and atraumatic.   Neck: supple with no carotid or supraclavicular bruits Cardiovascular: regular rate and rhythm, no murmurs Musculoskeletal: no deformity Skin:  no rash/petichiae Vascular:  Normal pulses all extremities   Neurologic Exam Mental Status: Awake and fully alert.   Fluent speech and language.  Oriented to place and time. Recent and remote memory intact. Attention span, concentration and fund of knowledge appropriate. Mood and affect appropriate.  Cranial Nerves: Pupils equal, briskly reactive to light. Extraocular movements full without  nystagmus. Visual fields full to confrontation. Hearing intact. Facial sensation intact. Face, tongue, palate moves normally and symmetrically.  Motor: Normal bulk and tone. Normal strength in all tested extremity muscles. Sensory.: intact to touch , pinprick , position and vibratory sensation.  Coordination: Rapid alternating movements normal in all extremities. Finger-to-nose and heel-to-shin performed accurately bilaterally. Gait and Station: Arises from chair without difficulty. Stance is normal. Gait demonstrates decreased stride length and step height and unstable left knee bending inwards while ambulating. Needs some assistance with ambulation (does not have RW at todays visit).  Tandem walking heel toe not attempted Reflexes: 1+ and symmetric. Toes downgoing.         ASSESSMENT: Sara Davila is a 87 y.o. year old female presented with left-sided weakness on 06/20/2019 with stroke work-up largely unremarkable and symptoms likely related to right brain TIA secondary to small vessel disease.  Returned on 07/11/2020 with reoccurring left-sided weakness and visual impairment with MRI negative but EEG showed evidence of epileptogenicity arising from right frontal region and cortical dysfunction in right hemisphere.  Vascular risk factors include prior TIA history, HTN, HLD, prediabetes (A1c 6.1), advanced age, former tobacco use, and EtOH use.  She is a long-term resident of [facility].      PLAN:  R brain TIA :  Previously on aspirin 81 mg daily  and atorvastatin but facility d/c'd per patient request - again educated on indication for antithrombotic and statin especially as unable to completely rule out TIA -difficulty tolerating aspirin and discussed use of Plavix but continues to declines -verbalizes understanding of increased risk.  Also declines use of statin therapy Close PCP follow up for aggressive stroke risk factor management HTN: BP goal<130/90.  Well-controlled managed by  PCP/facility HLD: LDL goal<70.  Declines use of statin therapy. Request routine monitoring per facility Seizures:  Stable without recurring symptoms or seizures Continue Keppra XR 500 mg daily for seizure prophylaxis BLE neuropathy:Continue gabapentin with great improvement - managed by ALF Gait impairment: likely multifactorial in setting of deconditioning, age, multiple joint pain and worsening left knee pain.  Encouraged continued HEP and participation with exercise classes  3 times weekly.  Discussed importance of use of rolling walker at all times    Follow up in 6 months with Dr. Jaynee Eagles (per patient request) or call earlier if needed   CC:  [name], MD    I spent 33 minutes of face-to-face and non-face-to-face time with patient.  This included previsit chart review, lab review, study review, electronic health record documentation, patient education regarding prior TIA prior seizure activity and ongoing use of AED, BLE neuropathy, gait impairment, importance of managing stroke risk factors and answered all other questions to patient satisfaction  Frann Rider, AGNP-BC  [facility] [address] [place], [place] [address]  Phone [phone] Fax [phone] Note: This document was prepared with digital dictation and possible smart phrase technology. Any transcriptional errors that result from this process are unintentional.

## 2021-09-04 NOTE — Patient Instructions (Addendum)
Continue current treatment plan - overall stable  No changes made today     Followup in the future with Dr. Jaynee Eagles in 6 months or call earlier if needed       Thank you for coming to see Sara Davila at [facility]. I hope we have been able to provide you high quality care today.  You may receive a patient satisfaction survey over the next few weeks. We would appreciate your feedback and comments so that we may continue to improve ourselves and the health of our patients.

## 2021-09-29 ENCOUNTER — Encounter: Payer: Self-pay | Admitting: Nurse Practitioner

## 2021-09-29 ENCOUNTER — Non-Acute Institutional Stay: Payer: Medicare Other | Admitting: Nurse Practitioner

## 2021-09-29 DIAGNOSIS — G2581 Restless legs syndrome: Secondary | ICD-10-CM

## 2021-09-29 DIAGNOSIS — F419 Anxiety disorder, unspecified: Secondary | ICD-10-CM

## 2021-09-29 DIAGNOSIS — F5105 Insomnia due to other mental disorder: Secondary | ICD-10-CM

## 2021-09-29 DIAGNOSIS — I872 Venous insufficiency (chronic) (peripheral): Secondary | ICD-10-CM

## 2021-09-29 DIAGNOSIS — R569 Unspecified convulsions: Secondary | ICD-10-CM

## 2021-09-29 DIAGNOSIS — K5901 Slow transit constipation: Secondary | ICD-10-CM

## 2021-09-29 DIAGNOSIS — E871 Hypo-osmolality and hyponatremia: Secondary | ICD-10-CM

## 2021-09-29 DIAGNOSIS — K219 Gastro-esophageal reflux disease without esophagitis: Secondary | ICD-10-CM

## 2021-09-29 DIAGNOSIS — E039 Hypothyroidism, unspecified: Secondary | ICD-10-CM | POA: Diagnosis not present

## 2021-09-29 DIAGNOSIS — M8000XS Age-related osteoporosis with current pathological fracture, unspecified site, sequela: Secondary | ICD-10-CM

## 2021-09-29 DIAGNOSIS — I10 Essential (primary) hypertension: Secondary | ICD-10-CM

## 2021-09-29 DIAGNOSIS — M159 Polyosteoarthritis, unspecified: Secondary | ICD-10-CM

## 2021-09-29 DIAGNOSIS — G459 Transient cerebral ischemic attack, unspecified: Secondary | ICD-10-CM

## 2021-09-29 DIAGNOSIS — H1013 Acute atopic conjunctivitis, bilateral: Secondary | ICD-10-CM

## 2021-09-29 DIAGNOSIS — M150 Primary generalized (osteo)arthritis: Secondary | ICD-10-CM

## 2021-09-29 NOTE — Assessment & Plan Note (Addendum)
takes Omeprazole. Hgb 11.05/08/11/22, update CBC/diff.

## 2021-09-29 NOTE — Assessment & Plan Note (Signed)
not apparent edema ankle/feet, takes Furosemide, Bun/creat 11/0.6 07/24/21

## 2021-09-29 NOTE — Assessment & Plan Note (Addendum)
left hip, s/p total left hip, multiple sites, it seems getting worse, usual  Gets better as day goes by,  arthroplasty,  takes Tylenol, Aleve, Gabapentin, Tramadol. ortho recommended to try Methocarbamol prn, the patient desires 1/2 tab=250mg  qid prn instead of 500mg  bid prn for left sciatica pain in the left hip regsion.

## 2021-09-29 NOTE — Assessment & Plan Note (Signed)
Blood pressure is controlled, takes Amlodipine, Furosemide

## 2021-09-29 NOTE — Assessment & Plan Note (Addendum)
takes Boniva, Ca, declined DEXA, will update Vit D level.

## 2021-09-29 NOTE — Assessment & Plan Note (Signed)
takes MiraLax, Senokot S, Colace

## 2021-09-29 NOTE — Assessment & Plan Note (Addendum)
stable, Na 131 07/24/21, update CMP/eGFR

## 2021-09-29 NOTE — Assessment & Plan Note (Signed)
uses Pataday eye drops

## 2021-09-29 NOTE — Assessment & Plan Note (Signed)
not sleeping well, prn Lorazepam, Zolpidem, failed Mirtazapine 

## 2021-09-29 NOTE — Assessment & Plan Note (Signed)
takes Gabapentin,  off Requip. 

## 2021-09-29 NOTE — Assessment & Plan Note (Signed)
saw neurology 03/03/21, takes Keppra. Hospitalized 8/12-8/19 for seizure with presentation of left sided weakness again. EEG showed focal seizure right frontal region. Takes Keppra 500mg  qd. last saw neurology 09/04/21

## 2021-09-29 NOTE — Assessment & Plan Note (Addendum)
TSH 3.73 01/07/21, takes Levothyroxine, repeat TSH

## 2021-09-29 NOTE — Assessment & Plan Note (Signed)
off  Atorvastatin, ASA per patient's request.

## 2021-09-29 NOTE — Progress Notes (Signed)
Location:   [place] Room Number: 256 Place of Service:  ALF (13) Provider: [facility] Lex Linhares NP  [name], MD  Patient Care Team: [name], MD as PCP - General (Internal Medicine)  Extended Emergency Contact Information Primary Emergency Contact: Milon Dikes [place] Phone: [phone] Mobile Phone: [phone] Relation: Friend Secondary Emergency Contact: Rodman Key Address: [address]          [place] [address] [place] Phone: [phone] Mobile Phone: [phone] Relation: Son  Code Status:  DNR Goals of care: Advanced Directive information Advanced Directives 09/29/2021  Does Patient Have a Medical Advance Directive? Yes  Type of Advance Directive Living will;Out of facility DNR (pink MOST or yellow form)  Does patient want to make changes to medical advance directive? No - Patient declined  Copy of [place] in Chart? -  Would patient like information on creating a medical advance directive? -  Pre-existing out of facility DNR order (yellow form or pink MOST form) Pink MOST form placed in chart (order not valid for inpatient use)     Chief Complaint  Patient presents with   Medical Management of Chronic Issues    Routine follow up visit.   Health Maintenance    Dexa scan,Zoster vaccine, COVID booster, Flu vaccine     HPI:  Pt is a 87 y.o. female seen today for medical management of chronic diseases.              COVID, treated with Paxlovid              RLS takes Gabapentin,  off Requip.              Edema BLE, not apparent edema ankle/feet, takes Furosemide, Bun/creat 11/0.6 07/24/21             Seizure, saw neurology 03/03/21, takes Keppra. Hospitalized 8/12-8/19 for seizure with presentation of left sided weakness again. EEG showed focal seizure right frontal region. Takes Keppra 532m qd.              HTN, takes Amlodipine, Furosemide              Hypothyroidism, TSH 3.73 01/07/21, takes Levothyroxine             Hyponatremia, stable, Na 131 07/24/21             Anxiety, not sleeping well, prn Lorazepam, Zolpidem, failed Mirtazapine             GERD, takes Omeprazole. Hgb 11.05/08/11/22             Constipation, takes MiraLax, Senokot S, Colace             OA, left hip, s/p total left hip, multiple sites, it seems getting worse, usual  Gets better as day goes by,  arthroplasty,  takes Tylenol, Aleve, Gabapentin, Tramadol.  ortho recommended to try Methocarbamol prn, the patient desires 1/2 tab=2517mqid prn instead of 50025mid prn for left sciatica pain in the left hip regsion.               Allergic conjunctivitis, uses Pataday eye drops             OP takes Boniva, Ca, declined DEXA             TIA, off  Atorvastatin, ASA per patient's request.  Past Medical History:  Diagnosis Date   Abdominal pain 02/26/2017   Anxiety    Arthritis    Left Knee, Wrist, Back and Hips   Cervical vertebral fusion    Diverticulitis    Diverticulosis    Fibromyalgia    GERD (gastroesophageal reflux disease)    Gout 02/2018   L hand   Hypertension     after d/c from hospital no current problems or medication   Hypothyroidism    Pelvis fracture ([place])    Peritoneal free air 03/29/2012   Pneumonia    Pneumoperitoneum 02/26/2017   Pre-diabetes    Seizure ([place])    Thyroid disorder    Toe pain 09/06/2016   Past Surgical History:  Procedure Laterality Date   ABDOMINAL HYSTERECTOMY     BOWEL RESECTION N/A 07/09/2017   Procedure: SMALL BOWEL RESECTION;  Surgeon: Johnathan Hausen, MD;  Location: [facility];  Service: General;  Laterality: N/A;   CERVICAL FUSION     x2   CONVERSION TO TOTAL HIP Left 10/31/2020   Procedure: CONVERSION TO ANTERIOR TOTAL HIP;  Surgeon: Paralee Cancel, MD;  Location: [facility];  Service: Orthopedics;  Laterality: Left;  2 hrs   FEMUR IM NAIL Left 03/03/2020   Procedure: INTRAMEDULLARY (IM) NAIL FEMORAL;  Surgeon: Paralee Cancel, MD;  Location: [facility];  Service: Orthopedics;  Laterality: Left;   LAPAROSCOPIC APPENDECTOMY N/A 03/04/2017   Procedure: APPENDECTOMY LAPAROSCOPIC;  Surgeon: Johnathan Hausen, MD;  Location: [facility];  Service: General;  Laterality: N/A;   LAPAROSCOPY N/A 03/04/2017   Procedure: LAPAROSCOPY DIAGNOSTIC, ENTEROLYSIS;  Surgeon: Johnathan Hausen, MD;  Location: [facility];  Service: General;  Laterality: N/A;   LAPAROTOMY N/A 07/09/2017   Procedure: EXPLORATORY LAPAROTOMY;  Surgeon: Johnathan Hausen, MD;  Location: [facility];  Service: General;  Laterality: N/A;   SPINE SURGERY     Lumbar- rod placement   TONSILLECTOMY     87y/o   TOTAL VAGINAL HYSTERECTOMY      Allergies  Allergen Reactions   Codeine Rash   Penicillins Rash    Has patient had a PCN reaction causing immediate rash, facial/tongue/throat swelling, SOB or lightheadedness with hypotension: No Has patient had a PCN reaction causing severe rash involving mucus membranes or skin necrosis: No Has patient had a PCN reaction that required hospitalization: No Has patient had a PCN reaction occurring within the last 10 years: No If all of the above answers are "NO", then may proceed with Cephalosporin use.  Tolerated Cephalosporin 10/31/20     Levaquin [Levofloxacin]     Allergies as of 09/29/2021       Reactions   Codeine Rash   Penicillins Rash   Has patient had a PCN reaction causing immediate rash, facial/tongue/throat swelling, SOB or lightheadedness with hypotension: No Has patient had a PCN reaction causing severe rash involving mucus membranes or skin necrosis: No Has patient had a PCN reaction that required hospitalization: No Has patient had a PCN reaction occurring within the last 10 years: No If all of the above answers are "NO", then may proceed with Cephalosporin use. Tolerated Cephalosporin 10/31/20   Levaquin [levofloxacin]         Medication List        Accurate as of September 29, 2021 11:59 PM. If you have any  questions, ask your nurse or doctor.          acetaminophen 500 MG tablet Commonly known as: TYLENOL Take 1,000 mg by mouth 2 (two) times daily.  CALCIUM 500 PO Take 500 mg by mouth daily.   furosemide 20 MG tablet Commonly known as: LASIX Take 20 mg by mouth every Monday, Wednesday, and Friday.   gabapentin 100 MG capsule Commonly known as: NEURONTIN Take 100 mg by mouth at bedtime.   ibandronate 150 MG tablet Commonly known as: BONIVA Take 150 mg by mouth every 30 (thirty) days. Take in the morning with a full glass of water, on an empty stomach, and do not take anything else by mouth or lie down for the next 30 min.   levETIRAcetam 500 MG tablet Commonly known as: KEPPRA Take 500 mg by mouth daily.   levothyroxine 75 MCG tablet Commonly known as: SYNTHROID Take 1 tablet (75 mcg total) by mouth daily before breakfast.   LORazepam 0.5 MG tablet Commonly known as: Ativan Take 0.5 tablets (0.25 mg total) by mouth 2 (two) times daily as needed for anxiety.   methocarbamol 500 MG tablet Commonly known as: ROBAXIN Take 500 mg by mouth 2 (two) times daily.   mupirocin ointment 2 % Commonly known as: BACTROBAN Apply 1 application topically 2 (two) times daily.   naproxen sodium 220 MG tablet Commonly known as: ALEVE Take 220 mg by mouth 2 (two) times daily as needed.   Olopatadine HCl 0.7 % Soln Apply to eye.   omeprazole 20 MG capsule Commonly known as: PRILOSEC Take 20 mg by mouth daily as needed (indigestion.).   phenol 1.4 % Liqd Commonly known as: CHLORASEPTIC Use as directed 1 spray in the mouth or throat every 4 (four) hours as needed for throat irritation / pain.   polyethylene glycol 17 g packet Commonly known as: MIRALAX / GLYCOLAX Take 17 g by mouth daily.   sennosides-docusate sodium 8.6-50 MG tablet Commonly known as: SENOKOT-S Take 1 tablet by mouth at bedtime as needed for constipation.   traMADol 50 MG tablet Commonly known as:  ULTRAM Take 50 mg by mouth every 6 (six) hours as needed.   vitamin D3 50 MCG (2000 UT) Caps Take 2,000 Units by mouth daily.   zolpidem 5 MG tablet Commonly known as: AMBIEN Take 5 mg by mouth at bedtime as needed for sleep.        Review of Systems  Constitutional:  Negative for activity change, appetite change and fever.  HENT:  Positive for hearing loss. Negative for congestion, rhinorrhea, sore throat and trouble swallowing.   Eyes:  Negative for redness and visual disturbance.  Respiratory:  Negative for shortness of breath.        Chronic cough  Cardiovascular:  Negative for leg swelling.  Gastrointestinal:  Negative for abdominal pain.  Genitourinary:  Negative for dysuria and urgency.       Bathroom trips 1-2/night  Musculoskeletal:  Positive for arthralgias and gait problem. Negative for back pain.       Left hip pain, s/p total left hip arthroplasty. Multiple sites aches in am, better as day goes by.   Skin:  Negative for color change.  Neurological:  Negative for seizures, weakness and headaches.       Restless legs at night.   Psychiatric/Behavioral:  Positive for sleep disturbance. Negative for behavioral problems. The patient is nervous/anxious.    Immunization History  Administered Date(s) Administered   Influenza Split 08/11/2018, 08/03/2019   Influenza-Unspecified 09/01/2018, 09/10/2020   Moderna Sars-Covid-2 Vaccination 12/04/2019, 01/01/2020   Pneumococcal Conjugate-13 05/29/2014   Pneumococcal Polysaccharide-23 06/14/2015   Tdap 09/14/2012   Zoster Recombinat (Shingrix) 07/05/2018  Zoster, Live 04/30/2004, 04/13/2018, 07/05/2018   Pertinent  Health Maintenance Due  Topic Date Due   DEXA SCAN  Never done   INFLUENZA VACCINE  06/30/2021   Fall Risk 11/02/2020 11/03/2020 11/03/2020 11/04/2020 04/23/2021  Falls in the past year? - - - - -  Was there an injury with Fall? - - - - -  Fall Risk Category Calculator - - - - -  Fall Risk Category - - - - -   Patient Fall Risk Level High fall risk High fall risk High fall risk High fall risk Moderate fall risk   Functional Status Survey:    Vitals:   09/29/21 1615  BP: 136/66  Pulse: 68  Resp: 20  Temp: 98.7 F (37.1 C)  SpO2: 94%  Weight: 100 lb 6.4 oz (45.5 kg)  Height: 5' (1.524 m)   Body mass index is 19.61 kg/m. Physical Exam Constitutional:      Appearance: Normal appearance.  HENT:     Head: Normocephalic and atraumatic.     Nose: No congestion or rhinorrhea.     Mouth/Throat:     Mouth: Mucous membranes are moist.  Eyes:     Extraocular Movements: Extraocular movements intact.     Pupils: Pupils are equal, round, and reactive to light.  Cardiovascular:     Rate and Rhythm: Normal rate and regular rhythm.     Heart sounds: No murmur heard.    Comments: Weak left DP pulse, more symptomatic tingling sensation at night.  Pulmonary:     Effort: Pulmonary effort is normal.     Breath sounds: No rales.  Abdominal:     General: Bowel sounds are normal.     Palpations: Abdomen is soft.     Tenderness: There is no abdominal tenderness.  Musculoskeletal:        General: Tenderness present.     Cervical back: Normal range of motion and neck supple.     Right lower leg: No edema.     Left lower leg: No edema.     Comments: mild scoliosis. Hx of back surgeries x3 with left sciatica pain. s/p total left hip replacement.   Skin:    General: Skin is warm and dry.  Neurological:     General: No focal deficit present.     Mental Status: She is alert and oriented to person, place, and time. Mental status is at baseline.     Gait: Gait abnormal.     Comments: Using walker sometimes.   Psychiatric:        Mood and Affect: Mood normal.        Behavior: Behavior normal.        Thought Content: Thought content normal.        Judgment: Judgment normal.    Labs reviewed: Recent Labs    11/01/20 0315 11/02/20 0351 04/23/21 1228 07/11/21 0000 07/24/21 0000  NA 133* 130*  134* 131*  126* 131*  K 4.1 4.1 4.0 4.6  4.3 4.6  CL 103 99 97* 100  96* 100  CO2 _0 25*  24* 25*  GLUCOSE 132* 105* 82  --   --   BUN _1 CREATININE 0.49 0.42* 0.68 0.6  0.7 0.6  CALCIUM 8.1* 8.0* 9.2 9.0 9.1   Recent Labs    04/23/21 1228 07/11/21 0000  AST 20 20  ALT 12 12  ALKPHOS 54 42  BILITOT 0.7  --   PROT  7.1  --   ALBUMIN 3.9 3.8   Recent Labs    11/01/20 0315 11/02/20 0351 11/12/20 0000 11/19/20 0000 04/23/21 1228 07/11/21 0000  WBC 7.6 9.3   < > 6.8 5.0 6.8  NEUTROABS  --   --    < > 3,488.00 2.9 5,535.00  HGB 8.8* 8.6*   < > 12.5 12.0 11.6*  HCT 26.4* 26.0*   < > 36 36.0 34*  MCV 98.1 99.6  --   --  97.3  --   PLT 289 266   < > 901* 451* 389   < > = values in this interval not displayed.   Lab Results  Component Value Date   TSH 2.389 06/19/2020   Lab Results  Component Value Date   HGBA1C 5.8 (H) 10/21/2020   Lab Results  Component Value Date   CHOL 171 06/20/2020   HDL 59 06/20/2020   LDLCALC 94 06/20/2020   TRIG 89 06/20/2020   CHOLHDL 2.9 06/20/2020    Significant Diagnostic Results in last 30 days:  No results found.  Assessment/Plan  Hypothyroidism TSH 3.73 01/07/21, takes Levothyroxine, repeat TSH  Hyponatremia stable, Na 131 07/24/21, update CMP/eGFR  Insomnia secondary to anxiety not sleeping well, prn Lorazepam, Zolpidem, failed Mirtazapine  GERD (gastroesophageal reflux disease) takes Omeprazole. Hgb 11.05/08/11/22, update CBC/diff.   Slow transit constipation takes MiraLax, Senokot S, Colace  Osteoarthritis, multiple sites  left hip, s/p total left hip, multiple sites, it seems getting worse, usual  Gets better as day goes by,  arthroplasty,  takes Tylenol, Aleve, Gabapentin, Tramadol.   Allergic conjunctivitis uses Pataday eye drops  Osteoporosis takes Boniva, Ca, declined DEXA, will update Vit D level.  TIA (transient ischemic attack) off  Atorvastatin, ASA per patient's request.    HTN (hypertension), benign Blood pressure is controlled, takes Amlodipine, Furosemide  Seizure ([place]) saw neurology 03/03/21, takes Keppra. Hospitalized 8/12-8/19 for seizure with presentation of left sided weakness again. EEG showed focal seizure right frontal region. Takes Keppra 57m qd. last saw neurology 09/04/21  Venous insufficiency not apparent edema ankle/feet, takes Furosemide, Bun/creat 11/0.6 07/24/21  Restless leg syndrome takes Gabapentin,  off Requip.    Family/ staff Communication: plan of care reviewed with the patient and charge nurse.   Labs/tests ordered:  CBC/diff, CMP/eGFR, Vit D, TSH  Time spend 40 minutes.

## 2021-09-30 ENCOUNTER — Encounter: Payer: Self-pay | Admitting: Nurse Practitioner

## 2021-10-08 LAB — COMPREHENSIVE METABOLIC PANEL
Albumin: 3.9 (ref 3.5–5.0)
Calcium: 9.9 (ref 8.7–10.7)
Globulin: 2.8

## 2021-10-08 LAB — BASIC METABOLIC PANEL
BUN: 20 (ref 4–21)
CO2: 22 (ref 13–22)
Chloride: 99 (ref 99–108)
Creatinine: 0.7 (ref 0.5–1.1)
Glucose: 89
Sodium: 134 — AB (ref 137–147)

## 2021-10-08 LAB — CBC AND DIFFERENTIAL
HCT: 38 (ref 36–46)
Hemoglobin: 12.8 (ref 12.0–16.0)
Neutrophils Absolute: 2905
Platelets: 479 — AB (ref 150–399)
WBC: 5.4

## 2021-10-08 LAB — HEPATIC FUNCTION PANEL
ALT: 9 (ref 7–35)
AST: 15 (ref 13–35)
Alkaline Phosphatase: 46 (ref 25–125)
Bilirubin, Total: 0.6

## 2021-10-08 LAB — TSH: TSH: 2.17 (ref 0.41–5.90)

## 2021-10-08 LAB — VITAMIN D 25 HYDROXY (VIT D DEFICIENCY, FRACTURES): Vit D, 25-Hydroxy: 26

## 2021-10-08 LAB — CBC: RBC: 3.94 (ref 3.87–5.11)

## 2021-10-13 ENCOUNTER — Non-Acute Institutional Stay: Payer: Medicare Other | Admitting: Nurse Practitioner

## 2021-10-13 ENCOUNTER — Encounter: Payer: Self-pay | Admitting: Nurse Practitioner

## 2021-10-13 DIAGNOSIS — F5105 Insomnia due to other mental disorder: Secondary | ICD-10-CM

## 2021-10-13 DIAGNOSIS — K5901 Slow transit constipation: Secondary | ICD-10-CM

## 2021-10-13 DIAGNOSIS — R569 Unspecified convulsions: Secondary | ICD-10-CM

## 2021-10-13 DIAGNOSIS — E039 Hypothyroidism, unspecified: Secondary | ICD-10-CM

## 2021-10-13 DIAGNOSIS — M159 Polyosteoarthritis, unspecified: Secondary | ICD-10-CM

## 2021-10-13 DIAGNOSIS — M8000XS Age-related osteoporosis with current pathological fracture, unspecified site, sequela: Secondary | ICD-10-CM | POA: Diagnosis not present

## 2021-10-13 DIAGNOSIS — G2581 Restless legs syndrome: Secondary | ICD-10-CM | POA: Diagnosis not present

## 2021-10-13 DIAGNOSIS — F419 Anxiety disorder, unspecified: Secondary | ICD-10-CM

## 2021-10-13 DIAGNOSIS — G459 Transient cerebral ischemic attack, unspecified: Secondary | ICD-10-CM

## 2021-10-13 DIAGNOSIS — K219 Gastro-esophageal reflux disease without esophagitis: Secondary | ICD-10-CM

## 2021-10-13 DIAGNOSIS — I872 Venous insufficiency (chronic) (peripheral): Secondary | ICD-10-CM

## 2021-10-13 DIAGNOSIS — E871 Hypo-osmolality and hyponatremia: Secondary | ICD-10-CM

## 2021-10-13 DIAGNOSIS — I10 Essential (primary) hypertension: Secondary | ICD-10-CM | POA: Diagnosis not present

## 2021-10-13 NOTE — Assessment & Plan Note (Signed)
saw neurology 03/03/21, takes Keppra. Hospitalized 8/12-8/19 for seizure with presentation of left sided weakness again. EEG showed focal seizure right frontal region. Takes Keppra 500mg  qd.

## 2021-10-13 NOTE — Assessment & Plan Note (Signed)
Blood pressure is controlled,  takes Amlodipine, Furosemide, Bun/creat 20/0.68 10/07/21

## 2021-10-13 NOTE — Assessment & Plan Note (Addendum)
OA, left hip, s/p total left hip, multiple sites, it seems getting worse, usual  Gets better as day goes by,  arthroplasty,  takes Tylenol, Aleve, Gabapentin, Tramadol.  ortho recommended to try Methocarbamol prn, the patient desires 1/2 tab=250mg  qid prn instead of 500mg  bid prn for left sciatica pain in the left hip region. The patient requested to dc Robaxin 2/2 make her unsteady, desires trying Flexeril 2.5mg  hs prn.

## 2021-10-13 NOTE — Assessment & Plan Note (Signed)
off  Atorvastatin, ASA per patient's request.

## 2021-10-13 NOTE — Assessment & Plan Note (Signed)
takes MiraLax, Senokot S, Colace

## 2021-10-13 NOTE — Assessment & Plan Note (Signed)
takes Gabapentin,  off Requip. 

## 2021-10-13 NOTE — Assessment & Plan Note (Signed)
not apparent edema ankle/feet, takes Furosemide, Bun/creat 20/0.68 10/07/21

## 2021-10-13 NOTE — Assessment & Plan Note (Addendum)
takes Boniva, Ca, Vit D, declined DEXA, Vit D 26 10/07/21

## 2021-10-13 NOTE — Assessment & Plan Note (Signed)
takes Levothyroxine, TSH 2.17 10/07/21

## 2021-10-13 NOTE — Assessment & Plan Note (Signed)
stable, Na 134 10/07/21

## 2021-10-13 NOTE — Assessment & Plan Note (Signed)
not sleeping well, prn Lorazepam, Zolpidem, failed Mirtazapine 

## 2021-10-13 NOTE — Assessment & Plan Note (Addendum)
State she feels full quickly, abd bloats, becomes firm, uncomfortable, takes Omeprazole. Hgb 12.8 10/07/21. F/u Surgeon instead of GI per patient's request.

## 2021-10-13 NOTE — Progress Notes (Signed)
Location:   [facility] [place] Room Number: 235 A Place of Service:  ALF (13) Provider: [facility] Iris Tatsch NP  [name], MD  Patient Care Team: [name], MD as PCP - General (Internal Medicine)  Extended Emergency Contact Information Primary Emergency Contact: Milon Dikes [place] Phone: [phone] Mobile Phone: [phone] Relation: Friend Secondary Emergency Contact: Rodman Key Address: [address] [place] [address] Johnnette Litter of [place] Phone: [phone] Mobile Phone: [phone] Relation: Son  Code Status: DNR Goals of care: Advanced Directive information Advanced Directives 10/13/2021  Does Patient Have a Medical Advance Directive? Yes  Type of Advance Directive Living will;Out of facility DNR (pink MOST or yellow form)  Does patient want to make changes to medical advance directive? No - Patient declined  Copy of [place] in Chart? -  Would patient like information on creating a medical advance directive? -  Pre-existing out of facility DNR order (yellow form or pink MOST form) Pink MOST form placed in chart (order not valid for inpatient use)     Chief Complaint  Patient presents with   Acute Visit    Acute visit for stomach issues    HPI:  Pt is a 87 y.o. female seen today for an acute visit for c/o stomach issues. State she feels full quickly, abd bloats, becomes firm, uncomfortable.   COVID, treated with Paxlovid              RLS takes Gabapentin,  off Requip.              Edema BLE, not apparent edema ankle/feet, takes Furosemide, Bun/creat 20/0.68 10/07/21             Seizure, saw neurology 03/03/21, takes Keppra. Hospitalized 8/12-8/19 for seizure with presentation of left sided weakness again. EEG showed focal seizure right frontal region. Takes Keppra 500mg  qd.              HTN, saw neurology 03/03/21, takes Keppra. Hospitalized 8/12-8/19 for seizure  with presentation of left sided weakness again. EEG showed focal seizure right frontal region. Takes Keppra 500mg  qd.              Hypothyroidism, takes Levothyroxine, TSH 2.17 10/07/21             Hyponatremia, stable, Na 134 10/07/21             Anxiety, not sleeping well, prn Lorazepam, Zolpidem, failed Mirtazapine             GERD, takes Omeprazole. Hgb 12.8 10/07/21             Constipation, takes MiraLax, Senokot S, Colace             OA, left hip, s/p total left hip, multiple sites, it seems getting worse, usual  Gets better as day goes by,  arthroplasty,  takes Tylenol, Aleve, Gabapentin, Tramadol.  ortho recommended to try Methocarbamol prn, the patient desires 1/2 tab=250mg  qid prn instead of 500mg  bid prn for left sciatica pain in the left hip regsion-the patient requested to dc it 2/2 make her wobbly.              Allergic conjunctivitis, uses Pataday eye drops             OP takes Boniva, Ca, Vit D, declined DEXA, Vit  D 16 10/07/21.              TIA, off  Atorvastatin, ASA per patient's request.    Past Medical History:  Diagnosis Date   Abdominal pain 02/26/2017   Anxiety    Arthritis    Left Knee, Wrist, Back and Hips   Cervical vertebral fusion    Diverticulitis    Diverticulosis    Fibromyalgia    GERD (gastroesophageal reflux disease)    Gout 02/2018   L hand   Hypertension     after d/c from hospital no current problems or medication   Hypothyroidism    Pelvis fracture ([place])    Peritoneal free air 03/29/2012   Pneumonia    Pneumoperitoneum 02/26/2017   Pre-diabetes    Seizure ([place])    Thyroid disorder    Toe pain 09/06/2016   Past Surgical History:  Procedure Laterality Date   ABDOMINAL HYSTERECTOMY     BOWEL RESECTION N/A 07/09/2017   Procedure: SMALL BOWEL RESECTION;  Surgeon: Johnathan Hausen, MD;  Location: [facility];  Service: General;  Laterality: N/A;   CERVICAL FUSION     x2   CONVERSION TO TOTAL HIP Left 10/31/2020   Procedure: CONVERSION TO ANTERIOR TOTAL  HIP;  Surgeon: Paralee Cancel, MD;  Location: [facility];  Service: Orthopedics;  Laterality: Left;  2 hrs   FEMUR IM NAIL Left 03/03/2020   Procedure: INTRAMEDULLARY (IM) NAIL FEMORAL;  Surgeon: Paralee Cancel, MD;  Location: [facility];  Service: Orthopedics;  Laterality: Left;   LAPAROSCOPIC APPENDECTOMY N/A 03/04/2017   Procedure: APPENDECTOMY LAPAROSCOPIC;  Surgeon: Johnathan Hausen, MD;  Location: [facility];  Service: General;  Laterality: N/A;   LAPAROSCOPY N/A 03/04/2017   Procedure: LAPAROSCOPY DIAGNOSTIC, ENTEROLYSIS;  Surgeon: Johnathan Hausen, MD;  Location: [facility];  Service: General;  Laterality: N/A;   LAPAROTOMY N/A 07/09/2017   Procedure: EXPLORATORY LAPAROTOMY;  Surgeon: Johnathan Hausen, MD;  Location: [facility];  Service: General;  Laterality: N/A;   SPINE SURGERY     Lumbar- rod placement   TONSILLECTOMY     87y/o   TOTAL VAGINAL HYSTERECTOMY      Allergies  Allergen Reactions   Codeine Rash   Penicillins Rash    Has patient had a PCN reaction causing immediate rash, facial/tongue/throat swelling, SOB or lightheadedness with hypotension: No Has patient had a PCN reaction causing severe rash involving mucus membranes or skin necrosis: No Has patient had a PCN reaction that required hospitalization: No Has patient had a PCN reaction occurring within the last 10 years: No If all of the above answers are "NO", then may proceed with Cephalosporin use.  Tolerated Cephalosporin 10/31/20     Levaquin [Levofloxacin]     Allergies as of 10/13/2021       Reactions   Codeine Rash   Penicillins Rash   Has patient had a PCN reaction causing immediate rash, facial/tongue/throat swelling, SOB or lightheadedness with hypotension: No Has patient had a PCN reaction causing severe rash involving mucus membranes or skin necrosis: No Has patient had a PCN reaction that required hospitalization: No Has patient had a PCN reaction occurring within the last 10 years: No If all of the above answers are "NO",  then may proceed with Cephalosporin use. Tolerated Cephalosporin 10/31/20   Levaquin [levofloxacin]         Medication List        Accurate as of October 13, 2021  3:32 PM. If you have any questions, ask your nurse or  doctor.          acetaminophen 500 MG tablet Commonly known as: TYLENOL Take 500 mg by mouth 2 (two) times daily.   CALCIUM 500 PO Take 500 mg by mouth daily.   furosemide 20 MG tablet Commonly known as: LASIX Take 20 mg by mouth every Monday, Wednesday, and Friday.   gabapentin 100 MG capsule Commonly known as: NEURONTIN Take 100 mg by mouth at bedtime.   ibandronate 150 MG tablet Commonly known as: BONIVA Take 150 mg by mouth every 30 (thirty) days. Take in the morning with a full glass of water, on an empty stomach, and do not take anything else by mouth or lie down for the next 30 min.   levETIRAcetam 500 MG tablet Commonly known as: KEPPRA Take 500 mg by mouth daily.   levothyroxine 75 MCG tablet Commonly known as: SYNTHROID Take 1 tablet (75 mcg total) by mouth daily before breakfast.   LORazepam 0.5 MG tablet Commonly known as: Ativan Take 0.5 tablets (0.25 mg total) by mouth 2 (two) times daily as needed for anxiety.   methocarbamol 500 MG tablet Commonly known as: ROBAXIN Take 500 mg by mouth 2 (two) times daily as needed.   methocarbamol 500 MG tablet Commonly known as: ROBAXIN Take 250 mg by mouth 4 (four) times daily as needed for muscle spasms. Special Instructions: May try at least 1hr apart if initial dose of 250mg  is ineffective.   mupirocin ointment 2 % Commonly known as: BACTROBAN Apply 1 application topically 2 (two) times daily.   naproxen sodium 220 MG tablet Commonly known as: ALEVE Take 220 mg by mouth 2 (two) times daily as needed.   Olopatadine HCl 0.7 % Soln Apply to eye.   omeprazole 20 MG capsule Commonly known as: PRILOSEC Take 20 mg by mouth daily as needed (indigestion.).   phenol 1.4 % Liqd Commonly  known as: CHLORASEPTIC Use as directed 1 spray in the mouth or throat every 4 (four) hours as needed for throat irritation / pain.   polyethylene glycol 17 g packet Commonly known as: MIRALAX / GLYCOLAX Take 17 g by mouth daily.   sennosides-docusate sodium 8.6-50 MG tablet Commonly known as: SENOKOT-S Take 1 tablet by mouth at bedtime as needed for constipation.   traMADol 50 MG tablet Commonly known as: ULTRAM Take 50 mg by mouth every 6 (six) hours as needed.   vitamin D3 50 MCG (2000 UT) Caps Take 2,000 Units by mouth daily.   zolpidem 5 MG tablet Commonly known as: AMBIEN Take 5 mg by mouth at bedtime as needed for sleep.        Review of Systems  Constitutional:  Negative for fatigue, fever and unexpected weight change.  HENT:  Positive for hearing loss. Negative for congestion, rhinorrhea, sore throat and trouble swallowing.   Eyes:  Negative for redness and visual disturbance.  Respiratory:  Negative for shortness of breath.        Chronic cough  Cardiovascular:  Negative for leg swelling.  Gastrointestinal:  Negative for abdominal pain, constipation, nausea and vomiting.       Sometimes gets full quickly then feels firm abd and discomfort.   Genitourinary:  Negative for dysuria and urgency.       Bathroom trips 1-2/night  Musculoskeletal:  Positive for arthralgias and gait problem. Negative for back pain.       Left hip pain, s/p total left hip arthroplasty. Multiple sites aches in am, better as day goes by.  Skin:  Negative for color change.  Neurological:  Negative for seizures, weakness and headaches.       Restless legs at night.   Psychiatric/Behavioral:  Positive for sleep disturbance. Negative for behavioral problems. The patient is nervous/anxious.    Immunization History  Administered Date(s) Administered   Influenza Split 08/11/2018, 08/03/2019   Influenza-Unspecified 09/01/2018, 09/10/2020, 09/18/2021   Moderna Sars-Covid-2 Vaccination 12/04/2019,  01/01/2020   Pneumococcal Conjugate-13 05/29/2014   Pneumococcal Polysaccharide-23 06/14/2015   Tdap 09/14/2012   Zoster Recombinat (Shingrix) 07/05/2018   Zoster, Live 04/30/2004, 04/13/2018, 07/05/2018   Pertinent  Health Maintenance Due  Topic Date Due   DEXA SCAN  Never done   INFLUENZA VACCINE  Completed   Fall Risk 11/02/2020 11/03/2020 11/03/2020 11/04/2020 04/23/2021  Falls in the past year? - - - - -  Was there an injury with Fall? - - - - -  Fall Risk Category Calculator - - - - -  Fall Risk Category - - - - -  Patient Fall Risk Level High fall risk High fall risk High fall risk High fall risk Moderate fall risk   Functional Status Survey:    Vitals:   10/13/21 1043  BP: 128/74  Pulse: 82  Resp: 18  Temp: 98.6 F (37 C)  SpO2: 96%  Weight: 100 lb 6.4 oz (45.5 kg)  Height: 5' (1.524 m)   Body mass index is 19.61 kg/m. Physical Exam Constitutional:      Appearance: Normal appearance.  HENT:     Head: Normocephalic and atraumatic.     Nose: No congestion or rhinorrhea.     Mouth/Throat:     Mouth: Mucous membranes are moist.  Eyes:     Extraocular Movements: Extraocular movements intact.     Pupils: Pupils are equal, round, and reactive to light.  Cardiovascular:     Rate and Rhythm: Normal rate and regular rhythm.     Heart sounds: No murmur heard.    Comments: Weak left DP pulse, more symptomatic tingling sensation at night.  Pulmonary:     Effort: Pulmonary effort is normal.     Breath sounds: No rales.  Abdominal:     General: Bowel sounds are normal. There is no distension.     Palpations: Abdomen is soft.     Tenderness: There is no abdominal tenderness. There is no right CVA tenderness, left CVA tenderness, guarding or rebound.     Hernia: No hernia is present.  Musculoskeletal:        General: Tenderness present.     Cervical back: Normal range of motion and neck supple.     Right lower leg: No edema.     Left lower leg: No edema.     Comments:  mild scoliosis. Hx of back surgeries x3 with left sciatica pain. s/p total left hip replacement.   Skin:    General: Skin is warm and dry.  Neurological:     General: No focal deficit present.     Mental Status: She is alert and oriented to person, place, and time. Mental status is at baseline.     Gait: Gait abnormal.     Comments: Using walker sometimes.   Psychiatric:        Mood and Affect: Mood normal.        Behavior: Behavior normal.        Thought Content: Thought content normal.        Judgment: Judgment normal.    Labs reviewed: Recent Labs  11/01/20 0315 11/02/20 0351 04/23/21 1228 07/11/21 0000 07/24/21 0000 10/08/21 0000  NA 133* 130* 134* 131*  126* 131* 134*  K 4.1 4.1 4.0 4.6  4.3 4.6  --   CL 103 99 97* 100  96* 100 99  CO2 23 23 31  25*  24* 25* 22  GLUCOSE 132* 105* 82  --   --   --   BUN 13 16 20 11  9 11 20   CREATININE 0.49 0.42* 0.68 0.6  0.7 0.6 0.7  CALCIUM 8.1* 8.0* 9.2 9.0 9.1 9.9   Recent Labs    04/23/21 1228 07/11/21 0000 10/08/21 0000  AST 20 20 15   ALT 12 12 9   ALKPHOS 54 42 46  BILITOT 0.7  --   --   PROT 7.1  --   --   ALBUMIN 3.9 3.8 3.9   Recent Labs    11/01/20 0315 11/02/20 0351 11/12/20 0000 04/23/21 1228 07/11/21 0000 10/08/21 0000  WBC 7.6 9.3   < > 5.0 6.8 5.4  NEUTROABS  --   --    < > 2.9 5,535.00 2,905.00  HGB 8.8* 8.6*   < > 12.0 11.6* 12.8  HCT 26.4* 26.0*   < > 36.0 34* 38  MCV 98.1 99.6  --  97.3  --   --   PLT 289 266   < > 451* 389 479*   < > = values in this interval not displayed.   Lab Results  Component Value Date   TSH 2.17 10/08/2021   Lab Results  Component Value Date   HGBA1C 5.8 (H) 10/21/2020   Lab Results  Component Value Date   CHOL 171 06/20/2020   HDL 59 06/20/2020   LDLCALC 94 06/20/2020   TRIG 89 06/20/2020   CHOLHDL 2.9 06/20/2020    Significant Diagnostic Results in last 30 days:  No results found.  Assessment/Plan: GERD (gastroesophageal reflux disease) State  she feels full quickly, abd bloats, becomes firm, uncomfortable, takes Omeprazole. Hgb 12.8 10/07/21. F/u Surgeon instead of GI per patient's request.   Osteoporosis  takes Boniva, Ca, Vit D, declined DEXA, Vit D 26 10/07/21  HTN (hypertension), benign Blood pressure is controlled,  takes Amlodipine, Furosemide, Bun/creat 20/0.68 10/07/21  Restless leg syndrome  takes Gabapentin,  off Requip.   Venous insufficiency not apparent edema ankle/feet, takes Furosemide, Bun/creat 20/0.68 10/07/21  Seizure ([place])  saw neurology 03/03/21, takes Keppra. Hospitalized 8/12-8/19 for seizure with presentation of left sided weakness again. EEG showed focal seizure right frontal region. Takes Keppra 500mg  qd.   Hypothyroidism takes Levothyroxine, TSH 2.17 10/07/21  Hyponatremia  stable, Na 134 10/07/21  Insomnia secondary to anxiety not sleeping well, prn Lorazepam, Zolpidem, failed Mirtazapine  Slow transit constipation takes MiraLax, Senokot S, Colace  Osteoarthritis, multiple sites OA, left hip, s/p total left hip, multiple sites, it seems getting worse, usual  Gets better as day goes by,  arthroplasty,  takes Tylenol, Aleve, Gabapentin, Tramadol.  ortho recommended to try Methocarbamol prn, the patient desires 1/2 tab=250mg  qid prn instead of 500mg  bid prn for left sciatica pain in the left hip region. The patient requested to dc Robaxin 2/2 make her unsteady, desires trying Flexeril 2.5mg  hs prn.   TIA (transient ischemic attack) off  Atorvastatin, ASA per patient's request.     Family/ staff Communication: plan of care reviewed with the patient and charge nurse.   Labs/tests ordered:  none  Time spend 40 minutes.

## 2021-11-05 ENCOUNTER — Non-Acute Institutional Stay: Payer: Medicare Other | Admitting: Orthopedic Surgery

## 2021-11-05 ENCOUNTER — Encounter: Payer: Self-pay | Admitting: Orthopedic Surgery

## 2021-11-05 DIAGNOSIS — K5901 Slow transit constipation: Secondary | ICD-10-CM

## 2021-11-05 DIAGNOSIS — M47816 Spondylosis without myelopathy or radiculopathy, lumbar region: Secondary | ICD-10-CM

## 2021-11-05 NOTE — Progress Notes (Signed)
Location:   [facility] Room Number: 906-A Place of Service:  [phone]) Provider:  Windell Moulding, NP    Patient Care Team: [name], MD as PCP - General (Internal Medicine)  Extended Emergency Contact Information Primary Emergency Contact: Milon Dikes [place] Phone: [phone] Mobile Phone: [phone] Relation: Friend Secondary Emergency Contact: Rodman Key Address: [address]          [place] [address] Johnnette Litter of [place] Phone: [phone] Mobile Phone: [phone] Relation: Son  Code Status:  DNR Goals of care: Advanced Directive information Advanced Directives 11/05/2021  Does Patient Have a Medical Advance Directive? Yes  Type of Advance Directive Living will;Out of facility DNR (pink MOST or yellow form)  Does patient want to make changes to medical advance directive? No - Patient declined  Copy of [place] in Chart? -  Would patient like information on creating a medical advance directive? -  Pre-existing out of facility DNR order (yellow form or pink MOST form) -     Chief Complaint  Patient presents with   Acute Visit    Increased back pain.    HPI:  Pt is a 87 y.o. female seen today for an acute visit for increased back pain.   Increased lower back pain began about 2 weeks ago. Describes feeling a "clicking" when ambulating. Pain rated 5/10, no radiation, described as sharp. Aggravated with walking. Denies loss of bladder and bowel control. 11/30 she was seen by Dr. Alvan Dame at [facility]. She reports having a spinal xray at office, unchanged scoliosis and degenerative changes to L1-L5. LTHR intact, no deformities or fractures noted. She was advise start Flexeril 10 mg qhs and continue tramadol prn.   This morning, she reports feeling very dizzy at night with increased flexeril. She is worried about falling. In addition, her pain has not  improved. History of multiple back surgeries in the past. She does not want to explore seeing Dr. Saintclair Halsted. Interested in seeing Dr. Nelva Bush at [facility] for spinal injections. Reports they were helpful in the past. She is currently taking gabapentin, scheduled tylenol and tramadol prn. She continues to exercise daily, light walking around building.   No recent falls or injuries. Ambulates with walker for long distances. Falls safety precautions reviewed with patient.   Reports hard stools since she has been moving less. LBM today.      Past Medical History:  Diagnosis Date   Abdominal pain 02/26/2017   Anxiety    Arthritis    Left Knee, Wrist, Back and Hips   Cervical vertebral fusion    Diverticulitis    Diverticulosis    Fibromyalgia    GERD (gastroesophageal reflux disease)    Gout 02/2018   L hand   Hypertension     after d/c from hospital no current problems or medication   Hypothyroidism    Pelvis fracture ([place])    Peritoneal free air 03/29/2012   Pneumonia    Pneumoperitoneum 02/26/2017   Pre-diabetes    Seizure ([place])    Thyroid disorder    Toe pain 09/06/2016   Past Surgical History:  Procedure Laterality Date   ABDOMINAL HYSTERECTOMY     BOWEL RESECTION N/A 07/09/2017   Procedure: SMALL BOWEL RESECTION;  Surgeon: Johnathan Hausen, MD;  Location: [facility];  Service: General;  Laterality: N/A;   CERVICAL FUSION     x2  CONVERSION TO TOTAL HIP Left 10/31/2020   Procedure: CONVERSION TO ANTERIOR TOTAL HIP;  Surgeon: Paralee Cancel, MD;  Location: [facility];  Service: Orthopedics;  Laterality: Left;  2 hrs   FEMUR IM NAIL Left 03/03/2020   Procedure: INTRAMEDULLARY (IM) NAIL FEMORAL;  Surgeon: Paralee Cancel, MD;  Location: [facility];  Service: Orthopedics;  Laterality: Left;   LAPAROSCOPIC APPENDECTOMY N/A 03/04/2017   Procedure: APPENDECTOMY LAPAROSCOPIC;  Surgeon: Johnathan Hausen, MD;  Location: [facility];  Service: General;  Laterality: N/A;   LAPAROSCOPY N/A 03/04/2017   Procedure:  LAPAROSCOPY DIAGNOSTIC, ENTEROLYSIS;  Surgeon: Johnathan Hausen, MD;  Location: [facility];  Service: General;  Laterality: N/A;   LAPAROTOMY N/A 07/09/2017   Procedure: EXPLORATORY LAPAROTOMY;  Surgeon: Johnathan Hausen, MD;  Location: [facility];  Service: General;  Laterality: N/A;   SPINE SURGERY     Lumbar- rod placement   TONSILLECTOMY     87y/o   TOTAL VAGINAL HYSTERECTOMY      Allergies  Allergen Reactions   Codeine Rash   Penicillins Rash    Has patient had a PCN reaction causing immediate rash, facial/tongue/throat swelling, SOB or lightheadedness with hypotension: No Has patient had a PCN reaction causing severe rash involving mucus membranes or skin necrosis: No Has patient had a PCN reaction that required hospitalization: No Has patient had a PCN reaction occurring within the last 10 years: No If all of the above answers are "NO", then may proceed with Cephalosporin use.  Tolerated Cephalosporin 10/31/20     Levaquin [Levofloxacin]     Allergies as of 11/05/2021       Reactions   Codeine Rash   Penicillins Rash   Has patient had a PCN reaction causing immediate rash, facial/tongue/throat swelling, SOB or lightheadedness with hypotension: No Has patient had a PCN reaction causing severe rash involving mucus membranes or skin necrosis: No Has patient had a PCN reaction that required hospitalization: No Has patient had a PCN reaction occurring within the last 10 years: No If all of the above answers are "NO", then may proceed with Cephalosporin use. Tolerated Cephalosporin 10/31/20   Levaquin [levofloxacin]         Medication List        Accurate as of November 05, 2021  4:28 PM. If you have any questions, ask your nurse or doctor.          STOP taking these medications    methocarbamol 500 MG tablet Commonly known as: ROBAXIN Stopped by: Yvonna Alanis, NP       TAKE these medications    acetaminophen 500 MG tablet Commonly known as: TYLENOL Take 500 mg by  mouth 2 (two) times daily.   CALCIUM 500 PO Take 500 mg by mouth daily.   cyclobenzaprine 5 MG tablet Commonly known as: FLEXERIL Take 5 mg by mouth at bedtime.   furosemide 20 MG tablet Commonly known as: LASIX Take 20 mg by mouth every Monday, Wednesday, and Friday.   gabapentin 100 MG capsule Commonly known as: NEURONTIN Take 100 mg by mouth at bedtime.   ibandronate 150 MG tablet Commonly known as: BONIVA Take 150 mg by mouth every 30 (thirty) days. Take in the morning with a full glass of water, on an empty stomach, and do not take anything else by mouth or lie down for the next 30 min.   levETIRAcetam 500 MG tablet Commonly known as: KEPPRA Take 500 mg by mouth daily.   levothyroxine 75 MCG tablet Commonly known as:  SYNTHROID Take 1 tablet (75 mcg total) by mouth daily before breakfast.   Lidocaine 4 % Ptch Apply 1 patch topically daily.   LORazepam 0.5 MG tablet Commonly known as: Ativan Take 0.5 tablets (0.25 mg total) by mouth 2 (two) times daily as needed for anxiety.   mupirocin ointment 2 % Commonly known as: BACTROBAN Apply 1 application topically 2 (two) times daily.   naproxen sodium 220 MG tablet Commonly known as: ALEVE Take 220 mg by mouth 2 (two) times daily as needed.   Olopatadine HCl 0.7 % Soln Apply to eye.   omeprazole 20 MG capsule Commonly known as: PRILOSEC Take 20 mg by mouth daily as needed (indigestion.).   phenol 1.4 % Liqd Commonly known as: CHLORASEPTIC Use as directed 1 spray in the mouth or throat every 4 (four) hours as needed for throat irritation / pain.   polyethylene glycol 17 g packet Commonly known as: MIRALAX / GLYCOLAX Take 17 g by mouth daily.   senna 8.6 MG tablet Commonly known as: SENOKOT Take 1 tablet by mouth daily.   sennosides-docusate sodium 8.6-50 MG tablet Commonly known as: SENOKOT-S Take 1 tablet by mouth at bedtime as needed for constipation.   traMADol 50 MG tablet Commonly known as:  ULTRAM Take 50 mg by mouth every 6 (six) hours as needed.   vitamin D3 50 MCG (2000 UT) Caps Take 2,000 Units by mouth daily.   zolpidem 5 MG tablet Commonly known as: AMBIEN Take 5 mg by mouth at bedtime as needed for sleep.        Review of Systems  Constitutional:  Negative for activity change, appetite change, chills, diaphoresis, fatigue and fever.  HENT: Negative.    Eyes: Negative.   Respiratory:  Negative for cough, shortness of breath and wheezing.   Cardiovascular:  Negative for chest pain and leg swelling.  Gastrointestinal: Negative.   Genitourinary:  Negative for dysuria, frequency and hematuria.  Musculoskeletal:  Positive for arthralgias, back pain and gait problem.  Skin: Negative.   Psychiatric/Behavioral:  Positive for sleep disturbance. Negative for dysphoric mood. The patient is not nervous/anxious.    Immunization History  Administered Date(s) Administered   Influenza Split 08/11/2018, 08/03/2019   Influenza-Unspecified 09/01/2018, 09/10/2020, 09/18/2021   Moderna Sars-Covid-2 Vaccination 12/04/2019, 01/01/2020   Pneumococcal Conjugate-13 05/29/2014   Pneumococcal Polysaccharide-23 06/14/2015   Tdap 09/14/2012   Zoster Recombinat (Shingrix) 07/05/2018   Zoster, Live 04/30/2004, 04/13/2018, 07/05/2018   Pertinent  Health Maintenance Due  Topic Date Due   DEXA SCAN  Never done   INFLUENZA VACCINE  Completed   Fall Risk 11/02/2020 11/03/2020 11/03/2020 11/04/2020 04/23/2021  Falls in the past year? - - - - -  Was there an injury with Fall? - - - - -  Fall Risk Category Calculator - - - - -  Fall Risk Category - - - - -  Patient Fall Risk Level High fall risk High fall risk High fall risk High fall risk Moderate fall risk   Functional Status Survey:    Vitals:   11/05/21 1551  BP: (!) 113/59  Pulse: 82  Resp: 18  Temp: 98.5 F (36.9 C)  SpO2: 96%  Height: 5' (1.524 m)   Body mass index is 19.61 kg/m. Physical Exam Vitals reviewed.   Constitutional:      General: She is not in acute distress. Eyes:     General:        Right eye: No discharge.  Left eye: No discharge.  Neck:     Vascular: No carotid bruit.     Comments: Forward neck protrusion Cardiovascular:     Rate and Rhythm: Normal rate and regular rhythm.     Pulses: Normal pulses.     Heart sounds: Normal heart sounds. No murmur heard. Pulmonary:     Effort: Pulmonary effort is normal. No respiratory distress.     Breath sounds: Normal breath sounds. No wheezing.  Abdominal:     General: Bowel sounds are normal. There is no distension.     Palpations: Abdomen is soft.     Tenderness: There is no abdominal tenderness.  Musculoskeletal:     Cervical back: No rigidity or tenderness.     Thoracic back: No swelling, deformity or tenderness. Normal range of motion. Scoliosis present.     Lumbar back: No swelling, deformity, tenderness or bony tenderness. Decreased range of motion. Scoliosis present.     Right lower leg: No edema.     Left lower leg: No edema.     Comments: Curvature to upper back, poor posture  Lymphadenopathy:     Cervical: No cervical adenopathy.  Skin:    General: Skin is warm and dry.     Capillary Refill: Capillary refill takes less than 2 seconds.  Neurological:     General: No focal deficit present.     Mental Status: She is alert and oriented to person, place, and time.     Motor: Weakness present.     Gait: Gait abnormal.  Psychiatric:        Mood and Affect: Mood normal.        Behavior: Behavior normal.    Labs reviewed: Recent Labs    04/23/21 1228 07/11/21 0000 07/24/21 0000 10/08/21 0000  NA 134* 131*  126* 131* 134*  K 4.0 4.6  4.3 4.6  --   CL 97* 100  96* 100 99  CO2 31 25*  24* 25* 22  GLUCOSE 82  --   --   --   BUN 20 11  9 11 20   CREATININE 0.68 0.6  0.7 0.6 0.7  CALCIUM 9.2 9.0 9.1 9.9   Recent Labs    04/23/21 1228 07/11/21 0000 10/08/21 0000  AST 20 20 15   ALT 12 12 9   ALKPHOS  54 42 46  BILITOT 0.7  --   --   PROT 7.1  --   --   ALBUMIN 3.9 3.8 3.9   Recent Labs    04/23/21 1228 07/11/21 0000 10/08/21 0000  WBC 5.0 6.8 5.4  NEUTROABS 2.9 5,535.00 2,905.00  HGB 12.0 11.6* 12.8  HCT 36.0 34* 38  MCV 97.3  --   --   PLT 451* 389 479*   Lab Results  Component Value Date   TSH 2.17 10/08/2021   Lab Results  Component Value Date   HGBA1C 5.8 (H) 10/21/2020   Lab Results  Component Value Date   CHOL 171 06/20/2020   HDL 59 06/20/2020   LDLCALC 94 06/20/2020   TRIG 89 06/20/2020   CHOLHDL 2.9 06/20/2020    Significant Diagnostic Results in last 30 days:  No results found.  Assessment/Plan: 1. Osteoarthritis of lumbar spine, unspecified spinal osteoarthritis complication status - increased back pain past 2 weeks - feels "clicking" when ambulating - pain moderate, increased with movement, no radiation/ loss of bowel/bladder - CT abdomen 2022 noted scoliosis and degenerative changes to L1-L5 - hx spinal surgery- Dr. Saintclair Halsted - felt  dizziness with increased Flexeril 10 mg - decrease flexeril to 5 mg qhs - increase scheduled tylenol to 1000 mg po bid - increase gabapentin to 100 mg po bid - start lidocaine patch 4 - apply to back daily - cont tramadol prn for moderate to severe pain - cont falls safety precautions - schedule appointment with Dr. Nelva Bush to discuss steroid injections  2. Slow transit constipation - reports hard stools today - suspect due to back pain and decreased activity - start senna 8.6 mg po daily - cont miralax daily - encourage hydration with water    Family/ staff Communication: plan discussed with patient and nurse  Labs/tests ordered: none

## 2021-11-13 ENCOUNTER — Non-Acute Institutional Stay (INDEPENDENT_AMBULATORY_CARE_PROVIDER_SITE_OTHER): Payer: Medicare Other | Admitting: Nurse Practitioner

## 2021-11-13 ENCOUNTER — Encounter: Payer: Self-pay | Admitting: Nurse Practitioner

## 2021-11-13 DIAGNOSIS — Z0000 Encounter for general adult medical examination without abnormal findings: Secondary | ICD-10-CM

## 2021-11-13 NOTE — Progress Notes (Addendum)
Subjective:   Sara Davila is a 87 y.o. female who presents for Medicare Annual (Subsequent) preventive examination at [facility].      Objective:    Today's Vitals   11/13/21 1154 11/13/21 1319  BP: (!) 113/59   Pulse: 82   Resp: 18   Temp: (!) 97.1 F (36.2 C)   SpO2: 96%   Weight: 100 lb 6.4 oz (45.5 kg)   Height: 5' (1.524 m)   PainSc:  5    Body mass index is 19.61 kg/m.  Advanced Directives 11/13/2021 11/05/2021 10/13/2021 09/29/2021 07/29/2021 07/16/2021 07/11/2021  Does Patient Have a Medical Advance Directive? Yes Yes Yes Yes Yes Yes Yes  Type of Advance Directive Living will;Out of facility DNR (pink MOST or yellow form) Living will;Out of facility DNR (pink MOST or yellow form) Living will;Out of facility DNR (pink MOST or yellow form) Living will;Out of facility DNR (pink MOST or yellow form) Living will;Out of facility DNR (pink MOST or yellow form) Living will;Out of facility DNR (pink MOST or yellow form) Living will;Out of facility DNR (pink MOST or yellow form)  Does patient want to make changes to medical advance directive? No - Patient declined No - Patient declined No - Patient declined No - Patient declined No - Patient declined No - Patient declined No - Patient declined  Copy of [place] in Chart? - - - - - - -  Would patient like information on creating a medical advance directive? - - - - - - -  Pre-existing out of facility DNR order (yellow form or pink MOST form) - - Pink MOST form placed in chart (order not valid for inpatient use) Pink MOST form placed in chart (order not valid for inpatient use) Yellow form placed in chart (order not valid for inpatient use) Yellow form placed in chart (order not valid for inpatient use) Yellow form placed in chart (order not valid for inpatient use);Pink MOST form placed in chart (order not valid for inpatient use)    Current Medications (verified) Outpatient Encounter  Medications as of 11/13/2021  Medication Sig   acetaminophen (TYLENOL) 500 MG tablet Take 1,000 mg by mouth 2 (two) times daily.   Calcium Carbonate (CALCIUM 500 PO) Take 500 mg by mouth daily.    cyclobenzaprine (FLEXERIL) 5 MG tablet Take 5 mg by mouth at bedtime.   furosemide (LASIX) 20 MG tablet Take 20 mg by mouth every Monday, Wednesday, and Friday.    gabapentin (NEURONTIN) 100 MG capsule Take 100 mg by mouth at bedtime.   ibandronate (BONIVA) 150 MG tablet Take 150 mg by mouth every 30 (thirty) days. Take in the morning with a full glass of water, on an empty stomach, and do not take anything else by mouth or lie down for the next 30 min.   levETIRAcetam (KEPPRA) 500 MG tablet Take 500 mg by mouth daily.    levothyroxine (SYNTHROID) 75 MCG tablet Take 1 tablet (75 mcg total) by mouth daily before breakfast.   Lidocaine 4 % PTCH Apply 1 patch topically daily.   LORazepam (ATIVAN) 0.5 MG tablet Take 0.5 tablets (0.25 mg total) by mouth 2 (two) times daily as needed for anxiety.   mupirocin ointment (BACTROBAN) 2 % Apply 1 application topically 2 (two) times daily.   naproxen sodium (ALEVE) 220 MG tablet Take 220 mg by mouth 2 (two) times daily as needed.   Olopatadine HCl 0.7 % SOLN Apply to eye.  omeprazole (PRILOSEC) 20 MG capsule Take 20 mg by mouth daily as needed (indigestion.).    phenol (CHLORASEPTIC) 1.4 % LIQD Use as directed 1 spray in the mouth or throat every 4 (four) hours as needed for throat irritation / pain.   polyethylene glycol (MIRALAX / GLYCOLAX) 17 g packet Take 17 g by mouth daily.    senna (SENOKOT) 8.6 MG tablet Take 1 tablet by mouth daily.   sennosides-docusate sodium (SENOKOT-S) 8.6-50 MG tablet Take 1 tablet by mouth at bedtime as needed for constipation.   traMADol (ULTRAM) 50 MG tablet Take 50 mg by mouth every 6 (six) hours as needed.   Vitamin D, Cholecalciferol, 50 MCG (2000 UT) CAPS Take 2,000 Units by mouth daily.    zolpidem (AMBIEN) 5 MG tablet Take  5 mg by mouth at bedtime as needed for sleep.   No facility-administered encounter medications on file as of 11/13/2021.    Allergies (verified) Codeine, Penicillins, and Levaquin [levofloxacin]   History: Past Medical History:  Diagnosis Date   Abdominal pain 02/26/2017   Anxiety    Arthritis    Left Knee, Wrist, Back and Hips   Cervical vertebral fusion    Diverticulitis    Diverticulosis    Fibromyalgia    GERD (gastroesophageal reflux disease)    Gout 02/2018   L hand   Hypertension     after d/c from hospital no current problems or medication   Hypothyroidism    Pelvis fracture ([place])    Peritoneal free air 03/29/2012   Pneumonia    Pneumoperitoneum 02/26/2017   Pre-diabetes    Seizure ([place])    Thyroid disorder    Toe pain 09/06/2016   Past Surgical History:  Procedure Laterality Date   ABDOMINAL HYSTERECTOMY     BOWEL RESECTION N/A 07/09/2017   Procedure: SMALL BOWEL RESECTION;  Surgeon: Johnathan Hausen, MD;  Location: [facility];  Service: General;  Laterality: N/A;   CERVICAL FUSION     x2   CONVERSION TO TOTAL HIP Left 10/31/2020   Procedure: CONVERSION TO ANTERIOR TOTAL HIP;  Surgeon: Paralee Cancel, MD;  Location: [facility];  Service: Orthopedics;  Laterality: Left;  2 hrs   FEMUR IM NAIL Left 03/03/2020   Procedure: INTRAMEDULLARY (IM) NAIL FEMORAL;  Surgeon: Paralee Cancel, MD;  Location: [facility];  Service: Orthopedics;  Laterality: Left;   LAPAROSCOPIC APPENDECTOMY N/A 03/04/2017   Procedure: APPENDECTOMY LAPAROSCOPIC;  Surgeon: Johnathan Hausen, MD;  Location: [facility];  Service: General;  Laterality: N/A;   LAPAROSCOPY N/A 03/04/2017   Procedure: LAPAROSCOPY DIAGNOSTIC, ENTEROLYSIS;  Surgeon: Johnathan Hausen, MD;  Location: [facility];  Service: General;  Laterality: N/A;   LAPAROTOMY N/A 07/09/2017   Procedure: EXPLORATORY LAPAROTOMY;  Surgeon: Johnathan Hausen, MD;  Location: [facility];  Service: General;  Laterality: N/A;   SPINE SURGERY     Lumbar- rod placement   TONSILLECTOMY      87y/o   TOTAL VAGINAL HYSTERECTOMY     Family History  Problem Relation Age of Onset   Asthma Mother    Pulmonary embolism Mother    Macular degeneration Mother    Heart disease Father    Stroke Father    Stroke Paternal Uncle    Breast cancer Maternal Aunt    Macular degeneration Sister    Stroke Sister    Neuropathy Neg Hx    Social History   Socioeconomic History   Marital status: Widowed    Spouse name: Not on file   Number of  children: 2   Years of education: RN   Highest education level: Not on file  Occupational History   Occupation: Retired   Tobacco Use   Smoking status: Former   Smokeless tobacco: Never   Tobacco comments:    Smoked from age [age]  Vaping Use   Vaping Use: Never used  Substance and Sexual Activity   Alcohol use: Not Currently    Comment: occasional glass of wine   Drug use: No   Sexual activity: Not Currently  Other Topics Concern   Not on file  Social History Narrative   Lives at [facility] independent living    Caffeine use: occasional cup of coffee, 2-3 times a week   Left handed         Diet: Regular      Do you drink/ eat things with caffeine? No      Marital status: Widowed                              What year were you married ? 1986      Do you live in a house, apartment,assistred living, condo, trailer, etc.)? [place]       Is it one or more stories? 4 th floor      How many persons live in your home ? 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  Yes                            Type & how often 5-6 x WK      ADVANCED DIRECTIVES (Please bring copies)      Do you have a living will? Yes      Do you have a DNR form?   Yes                    If not, do you want to discuss one?       Do you have signed POA?HPOA forms?   Yes              If so, please  bring to your appointment      FUNCTIONAL STATUS- To be completed by Spouse / child / Staff       Do you have difficulty bathing or dressing yourself ? No      Do you have difficulty preparing food or eating ?  No      Do you have difficulty managing your mediation ?No      Do you have difficulty managing your finances ? No      Do you have difficulty affording your medication ? No      Social Determinants of Radio broadcast assistant Strain: Not on file  Food Insecurity: Not on file  Transportation Needs: Not on file  Physical Activity: Not on file  Stress: Not on file  Social Connections: Not on file    Tobacco Counseling Counseling given: Not Answered Tobacco comments: Smoked from age [age]    Clinical Intake:  Pre-visit preparation completed: Yes  Pain : 0-10 Pain Score: 5  Pain Location: Back Pain Orientation: Left Pain Descriptors /  Indicators: Discomfort, Dull, Aching, Nagging, Radiating Pain Onset: More than a month ago Pain Frequency: Several days a week Pain Relieving Factors: Gabapentin, lying on her back. Effect of Pain on Daily Activities: limited walking.  Pain Relieving Factors: Gabapentin, lying on her back.  BMI - recorded: 19.61 Nutritional Status: BMI of 19-24  Normal Nutritional Risks: None Diabetes: No  How often do you need to have someone help you when you read instructions, pamphlets, or other written materials from your doctor or pharmacy?: 1 - Never  Diabetic?no  Interpreter Needed?: No  Information entered by :: Nial Hawe Bretta Bang NP   Activities of Daily Living In your present state of health, do you have any difficulty performing the following activities: 11/13/2021  Hearing? N  Vision? N  Difficulty concentrating or making decisions? N  Walking or climbing stairs? Y  Dressing or bathing? Y  Doing errands, shopping? Y  Preparing Food and eating ? N  Using the Toilet? N  In the past six months, have you accidently leaked  urine? Y  Do you have problems with loss of bowel control? Y  Managing your Medications? Y  Managing your Finances? Y  Housekeeping or managing your Housekeeping? Y  Some recent data might be hidden    Patient Care Team: [name], MD as PCP - General (Internal Medicine)  Indicate any recent Medical Services you may have received from other than [facility] providers in the past year (date may be approximate).     Assessment:   This is a routine wellness examination for Sara Davila.  Hearing/Vision screen No results found.  Dietary issues and exercise activities discussed: Current Exercise Habits: The patient does not participate in regular exercise at present, Exercise limited by: cardiac condition(s);neurologic condition(s);orthopedic condition(s)   Goals Addressed             This Visit's Progress    Maintain Mobility and Function       Evidence-based guidance:  Emphasize the importance of physical activity and aerobic exercise as included in treatment plan; assess barriers to adherence; consider patient's abilities and preferences.  Encourage gradual increase in activity or exercise instead of stopping if pain occurs.  Reinforce individual therapy exercise prescription, such as strengthening, stabilization and stretching programs.  Promote optimal body mechanics to stabilize the spine with lifting and functional activity.  Encourage activity and mobility modifications to facilitate optimal function, such as using a log roll for bed mobility or dressing from a seated position.  Reinforce individual adaptive equipment recommendations to limit excessive spinal movements, such as a Systems analyst.  Assess adequacy of sleep; encourage use of sleep hygiene techniques, such as bedtime routine; use of white noise; dark, cool bedroom; avoiding daytime naps, heavy meals or exercise before bedtime.  Promote positions and modification to optimize sleep and sexual activity; consider  pillows or positioning devices to assist in maintaining neutral spine.  Explore options for applying ergonomic principles at work and home, such as frequent position changes, using ergonomically designed equipment and working at optimal height.  Promote modifications to increase comfort with driving such as lumbar support, optimizing seat and steering wheel position, using cruise control and taking frequent rest stops to stretch and walk.   Notes:        Depression Screen No flowsheet data found.  Fall Risk Fall Risk  06/14/2020 04/26/2020  Falls in the past year? 0 1  Number falls in past yr: 0 0  Injury with Fall? - 1  FALL RISK PREVENTION PERTAINING TO THE HOME:  Any stairs in or around the home? Yes  If so, are there any without handrails? No  Home free of loose throw rugs in walkways, pet beds, electrical cords, etc? Yes  Adequate lighting in your home to reduce risk of falls? Yes   ASSISTIVE DEVICES UTILIZED TO PREVENT FALLS:  Life alert? No  Use of a cane, walker or w/c? Yes  Grab bars in the bathroom? Yes  Shower chair or bench in shower? Yes  Elevated toilet seat or a handicapped toilet? Yes   TIMED UP AND GO:  Was the test performed? Yes .  Length of time to ambulate 10 feet: 15 sec.   Gait slow and steady with assistive device  Cognitive Function: MMSE - Mini Mental State Exam 12/01/2017  Orientation to time 5  Orientation to Place 5  Registration 3  Attention/ Calculation 5  Recall 3  Language- name 2 objects 2  Language- repeat 1  Language- follow 3 step command 3  Language- read & follow direction 1  Write a sentence 1  Copy design 1  Total score 30        Immunizations Immunization History  Administered Date(s) Administered   Influenza Split 08/11/2018, 08/03/2019   Influenza-Unspecified 09/01/2018, 09/10/2020, 09/18/2021   Moderna Sars-Covid-2 Vaccination 12/04/2019, 01/01/2020   Pneumococcal Conjugate-13 05/29/2014   Pneumococcal  Polysaccharide-23 06/14/2015   Tdap 09/14/2012   Zoster Recombinat (Shingrix) 07/05/2018   Zoster, Live 04/30/2004, 04/13/2018, 07/05/2018    TDAP status: Up to date  Flu Vaccine status: Up to date  Pneumococcal vaccine status: Up to date  Covid-19 vaccine status: Information provided on how to obtain vaccines.   Qualifies for Shingles Vaccine? Yes   Zostavax completed Yes   Shingrix Completed?: Yes  Screening Tests Health Maintenance  Topic Date Due   DEXA SCAN  Never done   Zoster Vaccines- Shingrix (2 of 2) 08/30/2018   COVID-19 Vaccine (3 - Booster for Moderna series) 02/26/2020   TETANUS/TDAP  09/14/2022   Pneumonia Vaccine 14+ Years old  Completed   INFLUENZA VACCINE  Completed   HPV VACCINES  Aged Out    Health Maintenance  Health Maintenance Due  Topic Date Due   DEXA SCAN  Never done   Zoster Vaccines- Shingrix (2 of 2) 08/30/2018   COVID-19 Vaccine (3 - Booster for Moderna series) 02/26/2020    Colorectal cancer screening: No longer required.   Mammogram status: No longer required due to aged out.  Bone Density status: Ordered DEXA. Pt provided with contact info and advised to call to schedule appt.  Lung Cancer Screening: (Low Dose CT Chest recommended if Age 88-80 years, 30 pack-year currently smoking OR have quit w/in 15years.) does not qualify.   Additional Screening:  Hepatitis C Screening: does not qualify; Completed   Vision Screening: Recommended annual ophthalmology exams for early detection of glaucoma and other disorders of the eye. Is the patient up to date with their annual eye exam?  No  Who is the provider or what is the name of the office in which the patient attends annual eye exams? Needs referral.  If pt is not established with a provider, would they like to be referred to a provider to establish care? No .   Dental Screening: Recommended annual dental exams for proper oral hygiene  Community Resource Referral / Chronic Care  Management: CRR required this visit?  No   CCM required this visit?  No  Plan:     I have personally reviewed and noted the following in the patients chart:   Medical and social history Use of alcohol, tobacco or illicit drugs  Current medications and supplements including opioid prescriptions.  Functional ability and status Nutritional status Physical activity Advanced directives List of other physicians Hospitalizations, surgeries, and ER visits in previous 12 months Vitals Screenings to include cognitive, depression, and falls Referrals and appointments  In addition, I have reviewed and discussed with patient certain preventive protocols, quality metrics, and best practice recommendations. A written personalized care plan for preventive services as well as general preventive health recommendations were provided to patient.    DEXA, MMSE, Ophthalmology referral order provided.   Renelda Kilian X Raudel Bazen, NP   11/20/2021

## 2022-01-28 ENCOUNTER — Encounter: Payer: Self-pay | Admitting: Orthopedic Surgery

## 2022-01-28 NOTE — Progress Notes (Signed)
This encounter was created in error - please disregard.

## 2022-01-29 ENCOUNTER — Encounter: Payer: Self-pay | Admitting: Nurse Practitioner

## 2022-01-29 ENCOUNTER — Non-Acute Institutional Stay: Payer: Medicare Other | Admitting: Nurse Practitioner

## 2022-01-29 DIAGNOSIS — E039 Hypothyroidism, unspecified: Secondary | ICD-10-CM | POA: Diagnosis not present

## 2022-01-29 DIAGNOSIS — G2581 Restless legs syndrome: Secondary | ICD-10-CM

## 2022-01-29 DIAGNOSIS — M159 Polyosteoarthritis, unspecified: Secondary | ICD-10-CM

## 2022-01-29 DIAGNOSIS — I872 Venous insufficiency (chronic) (peripheral): Secondary | ICD-10-CM | POA: Diagnosis not present

## 2022-01-29 DIAGNOSIS — F419 Anxiety disorder, unspecified: Secondary | ICD-10-CM

## 2022-01-29 DIAGNOSIS — K5901 Slow transit constipation: Secondary | ICD-10-CM

## 2022-01-29 DIAGNOSIS — Z8673 Personal history of transient ischemic attack (TIA), and cerebral infarction without residual deficits: Secondary | ICD-10-CM

## 2022-01-29 DIAGNOSIS — R569 Unspecified convulsions: Secondary | ICD-10-CM | POA: Diagnosis not present

## 2022-01-29 DIAGNOSIS — H1013 Acute atopic conjunctivitis, bilateral: Secondary | ICD-10-CM

## 2022-01-29 DIAGNOSIS — M8000XS Age-related osteoporosis with current pathological fracture, unspecified site, sequela: Secondary | ICD-10-CM

## 2022-01-29 DIAGNOSIS — K219 Gastro-esophageal reflux disease without esophagitis: Secondary | ICD-10-CM

## 2022-01-29 DIAGNOSIS — F5105 Insomnia due to other mental disorder: Secondary | ICD-10-CM

## 2022-01-29 NOTE — Assessment & Plan Note (Signed)
Stable, uses Pataday eye drops ?

## 2022-01-29 NOTE — Assessment & Plan Note (Signed)
takes Omeprazole. Hgb 12.8 10/07/21 ?

## 2022-01-29 NOTE — Assessment & Plan Note (Signed)
OA, left hip, s/p total left hip, multiple sites, it seems getting worse, usual  Gets better as day goes by,  arthroplasty,  takes Tylenol, Aleve, Gabapentin, Tramadol.  failed Methocarbamol recommended by Ortho-felt wobbly.  ?

## 2022-01-29 NOTE — Assessment & Plan Note (Signed)
saw neurology, takes Keppra. Hospitalized 8/12-8/19 for seizure with presentation of left sided weakness again. EEG showed focal seizure right frontal region.  ?

## 2022-01-29 NOTE — Assessment & Plan Note (Signed)
not sleeping well, prn Lorazepam, Zolpidem, failed Mirtazapine 

## 2022-01-29 NOTE — Assessment & Plan Note (Signed)
Stable,  takes Gabapentin,  off Requip.  ?

## 2022-01-29 NOTE — Assessment & Plan Note (Signed)
takes Levothyroxine, TSH 2.17 10/07/21 ?

## 2022-01-29 NOTE — Assessment & Plan Note (Signed)
takes Boniva, Ca, Vit D, declined DEXA, Vit D 16 10/07/21.  ?

## 2022-01-29 NOTE — Assessment & Plan Note (Signed)
increased swelling BLE, DOE, and weight gained about #2Ibs. Denied cough, sputum production, or PND, will increaser Furosemide 5x/wk, Bun/creat 20/0.68 10/07/21, 06/20/20 EF 60-65%. Update CMP/eGFR 1 wk ?

## 2022-01-29 NOTE — Assessment & Plan Note (Signed)
off  Atorvastatin, ASA per patient's request.  ?

## 2022-01-29 NOTE — Assessment & Plan Note (Signed)
Stable,  takes MiraLax, Senokot S, Colace ?

## 2022-01-29 NOTE — Progress Notes (Signed)
Location:   [facility] Room Number: 906 Place of Service:  ALF [id number]) Provider:  Schae Cando, Lennie Odor NP   [name], MD  Patient Care Team: [name], MD as PCP - General (Internal Medicine)  Extended Emergency Contact Information Primary Emergency Contact: Milon Dikes [place] Phone: [phone] Mobile Phone: [phone] Relation: Friend Secondary Emergency Contact: Rodman Key Address: [address]          [place] [address] Johnnette L[place] of [place] Phone: [phone] Mobile Phone: [phone] Relation: Son  Code Status:  DNR Managed Care Goals of care: Advanced Directive information Advanced Directives 01/29/2022  Does Patient Have a Medical Advance Directive? Yes  Type of Advance Directive Out of facility DNR (pink MOST or yellow form)  Does patient want to make changes to medical advance directive? No - Patient declined  Copy of [place] in Chart? -  Would patient like information on creating a medical advance directive? -  Pre-existing out of facility DNR order (yellow form or pink MOST form) Pink MOST form placed in chart (order not valid for inpatient use)     Chief Complaint  Patient presents with   Acute Visit    Swelling legs    HPI:  Pt is a 88 y.o. female seen today for an acute visit for increased swelling BLE, DOE, and weight gained about #2Ibs. Denied cough, sputum production, or PND.  Edema BLE, takes Furosemide, Bun/creat 20/0.68 10/07/21, 06/20/20 EF 60-65%  COVID, treated with Paxlovid              RLS takes Gabapentin,  off Requip.  Seizure, saw neurology 03/03/21, takes Keppra. Hospitalized 8/12-8/19 for seizure with presentation of left sided weakness again. EEG showed focal seizure right frontal region.              Seizure, saw neurology,  takes [place]. Hospitalized 8/12-8/19 for seizure with presentation of left sided weakness again. EEG  showed focal seizure right frontal region. Takes Keppra 574m qd.              Hypothyroidism, takes Levothyroxine, TSH 2.17 10/07/21             Hyponatremia, stable, Na 134 10/07/21             Anxiety, not sleeping well, prn Lorazepam, Zolpidem, failed Mirtazapine             GERD, takes Omeprazole. Hgb 12.8 10/07/21             Constipation, takes MiraLax, Senokot S, Colace             OA, left hip, s/p total left hip, multiple sites, it seems getting worse, usual  Gets better as day goes by,  arthroplasty,  takes Tylenol, Aleve, Gabapentin, Tramadol.  failed Methocarbamol recommended by Ortho-felt wobbly.              Allergic conjunctivitis, uses Pataday eye drops             OP takes Boniva, Ca, Vit D, declined DEXA, Vit D 16 10/07/21.              TIA, off  Atorvastatin, ASA per patient's request.   Past Medical History:  Diagnosis Date   Abdominal pain 02/26/2017   Anxiety    Arthritis    Left Knee, Wrist, Back and Hips   Cervical  vertebral fusion    Diverticulitis    Diverticulosis    Fibromyalgia    GERD (gastroesophageal reflux disease)    Gout 02/2018   L hand   Hypertension     after d/c from hospital no current problems or medication   Hypothyroidism    Pelvis fracture ([place])    Peritoneal free air 03/29/2012   Pneumonia    Pneumoperitoneum 02/26/2017   Pre-diabetes    Seizure ([place])    Thyroid disorder    Toe pain 09/06/2016   Past Surgical History:  Procedure Laterality Date   ABDOMINAL HYSTERECTOMY     BOWEL RESECTION N/A 07/09/2017   Procedure: SMALL BOWEL RESECTION;  Surgeon: Johnathan Hausen, MD;  [facility];  Service: General;  Laterality: N/A;   CERVICAL FUSION     x2   CONVERSION TO TOTAL HIP Left 10/31/2020   Procedure: CONVERSION TO ANTERIOR TOTAL HIP;  Surgeon: Paralee Cancel, MD;  [facility];  Service: Orthopedics;  Laterality: Left;  2 hrs   FEMUR IM NAIL Left 03/03/2020   Procedure: INTRAMEDULLARY (IM) NAIL FEMORAL;  Surgeon: Paralee Cancel, MD;   [facility];  Service: Orthopedics;  Laterality: Left;   LAPAROSCOPIC APPENDECTOMY N/A 03/04/2017   Procedure: APPENDECTOMY LAPAROSCOPIC;  Surgeon: Johnathan Hausen, MD;  [facility];  Service: General;  Laterality: N/A;   LAPAROSCOPY N/A 03/04/2017   Procedure: LAPAROSCOPY DIAGNOSTIC, ENTEROLYSIS;  Surgeon: Johnathan Hausen, MD;  [facility];  Service: General;  Laterality: N/A;   LAPAROTOMY N/A 07/09/2017   Procedure: EXPLORATORY LAPAROTOMY;  Surgeon: Johnathan Hausen, MD;  [facility];  Service: General;  Laterality: N/A;   SPINE SURGERY     Lumbar- rod placement   TONSILLECTOMY     88y/o   TOTAL VAGINAL HYSTERECTOMY      Allergies  Allergen Reactions   Codeine Rash   Penicillins Rash    Has patient had a PCN reaction causing immediate rash, facial/tongue/throat swelling, SOB or lightheadedness with hypotension: No Has patient had a PCN reaction causing severe rash involving mucus membranes or skin necrosis: No Has patient had a PCN reaction that required hospitalization: No Has patient had a PCN reaction occurring within the last 10 years: No If all of the above answers are "NO", then may proceed with Cephalosporin use.  Tolerated Cephalosporin 10/31/20     Levaquin [Levofloxacin]     Allergies as of 01/29/2022       Reactions   Codeine Rash   Penicillins Rash   Has patient had a PCN reaction causing immediate rash, facial/tongue/throat swelling, SOB or lightheadedness with hypotension: No Has patient had a PCN reaction causing severe rash involving mucus membranes or skin necrosis: No Has patient had a PCN reaction that required hospitalization: No Has patient had a PCN reaction occurring within the last 10 years: No If all of the above answers are "NO", then may proceed with Cephalosporin use. Tolerated Cephalosporin 10/31/20   Levaquin [levofloxacin]         Medication List        Accurate as of January 29, 2022  3:50 PM. If you have any questions, ask  your nurse or doctor.          acetaminophen 500 MG tablet Commonly known as: TYLENOL Take 1,000 mg by mouth 2 (two) times daily.   CALCIUM 500 PO Take 500 mg by mouth daily.   furosemide 20 MG tablet Commonly known as: LASIX Take 20 mg by mouth every Monday, Wednesday, and  Friday.   gabapentin 100 MG capsule Commonly known as: NEURONTIN Take 100 mg by mouth at bedtime.   ibandronate 150 MG tablet Commonly known as: BONIVA Take 150 mg by mouth every 30 (thirty) days. Take in the morning with a full glass of water, on an empty stomach, and do not take anything else by mouth or lie down for the next 30 min.   levETIRAcetam 500 MG tablet Commonly known as: KEPPRA Take 500 mg by mouth daily.   levothyroxine 75 MCG tablet Commonly known as: SYNTHROID Take 1 tablet (75 mcg total) by mouth daily before breakfast.   LORazepam 0.5 MG tablet Commonly known as: Ativan Take 0.5 tablets (0.25 mg total) by mouth 2 (two) times daily as needed for anxiety.   mupirocin ointment 2 % Commonly known as: BACTROBAN Apply 1 application topically 2 (two) times daily.   naproxen sodium 220 MG tablet Commonly known as: ALEVE Take 220 mg by mouth 2 (two) times daily as needed.   Olopatadine HCl 0.7 % Soln Apply to eye.   phenol 1.4 % Liqd Commonly known as: CHLORASEPTIC Use as directed 1 spray in the mouth or throat every 4 (four) hours as needed for throat irritation / pain.   polyethylene glycol 17 g packet Commonly known as: MIRALAX / GLYCOLAX Take 17 g by mouth daily.   sennosides-docusate sodium 8.6-50 MG tablet Commonly known as: SENOKOT-S Take 1 tablet by mouth at bedtime as needed for constipation.   traMADol 50 MG tablet Commonly known as: ULTRAM Take 50 mg by mouth every 6 (six) hours as needed.   vitamin D3 50 MCG (2000 UT) Caps Take 2,000 Units by mouth daily.   zolpidem 5 MG tablet Commonly known as: AMBIEN Take 5 mg by mouth at bedtime as needed for sleep.         Review of Systems  Constitutional:  Positive for unexpected weight change. Negative for fatigue and fever.       Weight gained about #2Ibs.   HENT:  Positive for hearing loss. Negative for congestion, rhinorrhea, sore throat and trouble swallowing.   Eyes:  Negative for redness and visual disturbance.  Respiratory:  Positive for shortness of breath. Negative for cough and wheezing.        DOE  Cardiovascular:  Positive for leg swelling.  Gastrointestinal:  Negative for abdominal pain and constipation.       Sometimes gets full quickly then feels firm abd and discomfort.   Genitourinary:  Negative for dysuria and urgency.       Bathroom trips 1-2/night  Musculoskeletal:  Positive for arthralgias, back pain and gait problem.       Left hip pain, s/p total left hip arthroplasty. Multiple sites aches in am, better as day goes by.   Skin:  Negative for color change.  Neurological:  Negative for seizures, weakness and headaches.       Restless legs at night.   Psychiatric/Behavioral:  Positive for sleep disturbance. Negative for behavioral problems. The patient is nervous/anxious.    Immunization History  Administered Date(s) Administered   Influenza Split 08/11/2018, 08/03/2019   Influenza-Unspecified 09/01/2018, 09/10/2020, 09/18/2021   Moderna Sars-Covid-2 Vaccination 12/04/2019, 01/01/2020   Pneumococcal Conjugate-13 05/29/2014   Pneumococcal Polysaccharide-23 06/14/2015   Tdap 09/14/2012   Zoster Recombinat (Shingrix) 07/05/2018   Zoster, Live 04/30/2004, 04/13/2018, 07/05/2018   Pertinent  Health Maintenance Due  Topic Date Due   DEXA SCAN  01/30/2023 (Originally 10/25/1997)   INFLUENZA VACCINE  Completed  Fall Risk 11/02/2020 11/03/2020 11/03/2020 11/04/2020 04/23/2021  Falls in the past year? - - - - -  Was there an injury with Fall? - - - - -  Fall Risk Category Calculator - - - - -  Fall Risk Category - - - - -  Patient Fall Risk Level High fall risk High fall risk  High fall risk High fall risk Moderate fall risk   Functional Status Survey:    Vitals:   01/29/22 1044  BP: 135/87  Pulse: 88  Resp: 18  Temp: (!) 97.2 F (36.2 C)  SpO2: 98%  Weight: 102 lb (46.3 kg)  Height: 5' (1.524 m)   Body mass index is 19.92 kg/m. Physical Exam Constitutional:      Appearance: Normal appearance.  HENT:     Head: Normocephalic and atraumatic.     Nose: Nose normal.     Mouth/Throat:     Mouth: Mucous membranes are moist.  Eyes:     Extraocular Movements: Extraocular movements intact.     Pupils: Pupils are equal, round, and reactive to light.  Cardiovascular:     Rate and Rhythm: Normal rate and regular rhythm.     Heart sounds: No murmur heard.    Comments: Weak left DP pulse, more symptomatic tingling sensation at night.  Pulmonary:     Effort: Pulmonary effort is normal.     Breath sounds: No rales.  Abdominal:     General: Bowel sounds are normal.     Palpations: Abdomen is soft.     Tenderness: There is no abdominal tenderness.  Musculoskeletal:        General: Tenderness present.     Cervical back: Normal range of motion and neck supple.     Right lower leg: Edema present.     Left lower leg: Edema present.     Comments: mild scoliosis. Hx of back surgeries x3 with left sciatica pain. s/p total left hip replacement. 1+ edema BLE  Skin:    General: Skin is warm and dry.  Neurological:     General: No focal deficit present.     Mental Status: She is alert and oriented to person, place, and time. Mental status is at baseline.     Gait: Gait abnormal.     Comments: Using walker sometimes.   Psychiatric:        Mood and Affect: Mood normal.        Behavior: Behavior normal.        Thought Content: Thought content normal.        Judgment: Judgment normal.    Labs reviewed: Recent Labs    04/23/21 1228 07/11/21 0000 07/24/21 0000 10/08/21 0000  NA 134* 131*   126* 131* 134*  K 4.0 4.6   4.3 4.6  --   CL 97* 100   96* 100 99   CO2 31 25*   24* 25* 22  GLUCOSE 82  --   --   --   BUN '20 11   9 11 20  ' CREATININE 0.68 0.6   0.7 0.6 0.7  CALCIUM 9.2 9.0 9.1 9.9   Recent Labs    04/23/21 1228 07/11/21 0000 10/08/21 0000  AST '20 20 15  ' ALT '12 12 9  ' ALKPHOS 54 42 46  BILITOT 0.7  --   --   PROT 7.1  --   --   ALBUMIN 3.9 3.8 3.9   Recent Labs    04/23/21 1228 07/11/21 0000 10/08/21  0000  WBC 5.0 6.8 5.4  NEUTROABS 2.9 5,535.00 2,905.00  HGB 12.0 11.6* 12.8  HCT 36.0 34* 38  MCV 97.3  --   --   PLT 451* 389 479*   Lab Results  Component Value Date   TSH 2.17 10/08/2021   Lab Results  Component Value Date   HGBA1C 5.8 (H) 10/21/2020   Lab Results  Component Value Date   CHOL 171 06/20/2020   HDL 59 06/20/2020   LDLCALC 94 06/20/2020   TRIG 89 06/20/2020   CHOLHDL 2.9 06/20/2020    Significant Diagnostic Results in last 30 days:  No results found.  Assessment/Plan Venous insufficiency increased swelling BLE, DOE, and weight gained about #2Ibs. Denied cough, sputum production, or PND, will increaser Furosemide 5x/wk, Bun/creat 20/0.68 10/07/21, 06/20/20 EF 60-65%. Update CMP/eGFR 1 wk  Restless leg syndrome Stable,  takes Gabapentin,  off Requip.   Seizure [facility])  saw neurology, takes Keppra. Hospitalized 8/12-8/19 for seizure with presentation of left sided weakness again. EEG showed focal seizure right frontal region.   Hypothyroidism takes Levothyroxine, TSH 2.17 10/07/21  Insomnia secondary to anxiety not sleeping well, prn Lorazepam, Zolpidem, failed Mirtazapine  GERD (gastroesophageal reflux disease) takes Omeprazole. Hgb 12.8 10/07/21  Slow transit constipation Stable,  takes MiraLax, Senokot S, Colace  Osteoarthritis, multiple sites OA, left hip, s/p total left hip, multiple sites, it seems getting worse, usual  Gets better as day goes by,  arthroplasty,  takes Tylenol, Aleve, Gabapentin, Tramadol.  failed Methocarbamol recommended by Ortho-felt wobbly.   Allergic  conjunctivitis Stable, uses Pataday eye drops  Osteoporosis takes Boniva, Ca, Vit D, declined DEXA, Vit D 16 10/07/21.   History of TIA (transient ischemic attack) off  Atorvastatin, ASA per patient's request.      Family/ staff Communication: plan of care reviewed with the patient and charge nurse.   Labs/tests ordered:   CMP/eGFR 1 week  Time spend 40 minutes.

## 2022-02-06 LAB — BASIC METABOLIC PANEL
BUN: 18 (ref 4–21)
CO2: 19 (ref 13–22)
Chloride: 99 (ref 99–108)
Creatinine: 0.7 (ref 0.5–1.1)
Glucose: 81
Potassium: 4.5 mEq/L (ref 3.5–5.1)
Sodium: 133 — AB (ref 137–147)

## 2022-02-06 LAB — HEPATIC FUNCTION PANEL
ALT: 12 U/L (ref 7–35)
AST: 20 (ref 13–35)
Alkaline Phosphatase: 44 (ref 25–125)
Bilirubin, Total: 0.6

## 2022-02-06 LAB — COMPREHENSIVE METABOLIC PANEL
Albumin: 3.9 (ref 3.5–5.0)
Calcium: 9.7 (ref 8.7–10.7)
Globulin: 2.6

## 2022-02-20 LAB — BASIC METABOLIC PANEL
BUN: 15 (ref 4–21)
CO2: 24 — AB (ref 13–22)
Chloride: 102 (ref 99–108)
Creatinine: 0.7 (ref 0.5–1.1)
Glucose: 87
Potassium: 4.5 mEq/L (ref 3.5–5.1)
Sodium: 133 — AB (ref 137–147)

## 2022-02-20 LAB — HEPATIC FUNCTION PANEL
ALT: 9 U/L (ref 7–35)
AST: 18 (ref 13–35)
Alkaline Phosphatase: 40 (ref 25–125)
Bilirubin, Total: 0.4

## 2022-02-20 LAB — COMPREHENSIVE METABOLIC PANEL
Albumin: 3.7 (ref 3.5–5.0)
Calcium: 9 (ref 8.7–10.7)
Globulin: 1.9

## 2022-03-16 ENCOUNTER — Ambulatory Visit: Payer: Medicare Other | Admitting: Neurology

## 2022-03-24 ENCOUNTER — Encounter: Payer: Self-pay | Admitting: Internal Medicine

## 2022-03-24 ENCOUNTER — Non-Acute Institutional Stay: Payer: Medicare Other | Admitting: Internal Medicine

## 2022-03-24 DIAGNOSIS — R569 Unspecified convulsions: Secondary | ICD-10-CM | POA: Diagnosis not present

## 2022-03-24 DIAGNOSIS — F5105 Insomnia due to other mental disorder: Secondary | ICD-10-CM

## 2022-03-24 DIAGNOSIS — E039 Hypothyroidism, unspecified: Secondary | ICD-10-CM

## 2022-03-24 DIAGNOSIS — F419 Anxiety disorder, unspecified: Secondary | ICD-10-CM | POA: Diagnosis not present

## 2022-03-24 DIAGNOSIS — R140 Abdominal distension (gaseous): Secondary | ICD-10-CM

## 2022-03-24 DIAGNOSIS — G2581 Restless legs syndrome: Secondary | ICD-10-CM

## 2022-03-24 DIAGNOSIS — M8000XS Age-related osteoporosis with current pathological fracture, unspecified site, sequela: Secondary | ICD-10-CM

## 2022-03-24 DIAGNOSIS — K219 Gastro-esophageal reflux disease without esophagitis: Secondary | ICD-10-CM

## 2022-03-24 DIAGNOSIS — M159 Polyosteoarthritis, unspecified: Secondary | ICD-10-CM

## 2022-03-24 NOTE — Progress Notes (Signed)
?Location:   [facility]  ?Nursing Home Room Number: 595 ?Place of Service:  ALF (13) ?Provider:  Veleta Miners MD  ? ?[name], MD ? ?Patient Care Team: ?[name], MD as PCP - General (Internal Medicine) ? ?Extended Emergency Contact Information ?Primary Emergency Contact: Norton,Ronald ? [place] ?Home Phone: [phone] ?Mobile Phone: [phone] ?Relation: Friend ?Secondary Emergency Contact: Rodman Key ?Address: [address] ?         [address] ?         [place] [address] [place] ?Home Phone: [phone] ?Mobile Phone: [phone] ?Relation: Son ? ?Code Status:  DNR Managed Care ?Goals of care: Advanced Directive information ? ?  03/24/2022  ? 11:51 AM  ?Advanced Directives  ?Does Patient Have a Medical Advance Directive? Yes  ?Type of Advance Directive Living will;Out of facility DNR (pink MOST or yellow form)  ?Does patient want to make changes to medical advance directive? No - Patient declined  ?Pre-existing out of facility DNR order (yellow form or pink MOST form) Yellow form placed in chart (order not valid for inpatient use)  ? ? ? ?Chief Complaint  ?Patient presents with  ? Medical Management of Chronic Issues  ? ? ?HPI:  ?Pt is a 88 y.o. female seen today for medical management of chronic diseases.   ? ?Lives in [place] ? ?She a has h/o hypothyroidism, Osteoporosis, Arthritis and GERD and Depression and Anxiety also Insomnia. A h/o Seizures with Left sided weakness ?left total hip arthroplasty with anterior approach on 12/02 ? ?Continues to have Knee and Hip Pain  Pain and Sees Dr Alvan Dame  ?Saw Surgeon for ? Diverticular pain/ Abdominal Bloating ?Dr Nelva Bush for Back Pain Has Scoliosis ?S/p Epidural injection and He recommended therapy ? 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  ? ?Past Medical History:  ?Diagnosis Date  ? Abdominal pain 02/26/2017  ? Anxiety   ? Arthritis   ? Left Knee, Wrist, Back and Hips  ? Cervical vertebral fusion   ? Diverticulitis   ? Diverticulosis   ? Fibromyalgia   ? GERD (gastroesophageal reflux disease)   ? Gout 02/2018  ? L hand  ? Hypertension   ?  after d/c from hospital no current problems or medication  ? Hypothyroidism   ? Pelvis fracture ([place])   ? Peritoneal free air 03/29/2012  ? Pneumonia   ? Pneumoperitoneum 02/26/2017  ? Pre-diabetes   ? Seizure ([place])   ? Thyroid disorder   ? Toe pain 09/06/2016  ? ?Past Surgical History:  ?Procedure Laterality Date  ? ABDOMINAL HYSTERECTOMY    ? BOWEL RESECTION N/A 07/09/2017  ? Procedure: SMALL BOWEL RESECTION;  Surgeon: Johnathan Hausen, MD;  [facility];  Service: General;  Laterality: N/A;  ? CERVICAL FUSION    ? x2  ? CONVERSION TO TOTAL HIP Left 10/31/2020  ? Procedure: CONVERSION TO ANTERIOR TOTAL HIP;  Surgeon: Paralee Cancel, MD;  [facility];  Service: Orthopedics;  Laterality: Left;  2 hrs  ? FEMUR IM NAIL Left 03/03/2020  ? Procedure: INTRAMEDULLARY (IM) NAIL FEMORAL;  Surgeon: Paralee Cancel, MD;  [facility];  Service:  Orthopedics;  Laterality: Left;  ? LAPAROSCOPIC APPENDECTOMY N/A 03/04/2017  ? Procedure: APPENDECTOMY LAPAROSCOPIC;  Surgeon: Johnathan Hausen, MD;  [facility];  Service: General;  Laterality: N/A;  ? LAPAROSCOPY N/A 03/04/2017  ? Procedure: LAPAROSCOPY DIAGNOSTIC, ENTEROLYSIS;  Surgeon: Johnathan Hausen, MD;  [facility];  Service: General;  Laterality: N/A;  ? LAPAROTOMY N/A 07/09/2017  ? Procedure: EXPLORATORY LAPAROTOMY;  Surgeon: Johnathan Hausen, MD;  [facility];  Service: General;  Laterality: N/A;  ? SPINE SURGERY    ? Lumbar- rod placement  ? TONSILLECTOMY    ? 88y/o  ? TOTAL VAGINAL HYSTERECTOMY    ? ? ?Allergies  ?Allergen Reactions  ? Codeine Rash  ? Penicillins  Rash  ?  Has patient had a PCN reaction causing immediate rash, facial/tongue/throat swelling, SOB or lightheadedness with hypotension: No ?Has patient had a PCN reaction causing severe rash involving mucus membranes or skin necrosis: No ?Has patient had a PCN reaction that required hospitalization: No ?Has patient had a PCN reaction occurring within the last 10 years: No ?If all of the above answers are "NO", then may proceed with Cephalosporin use. ? ?Tolerated Cephalosporin 10/31/20 ? ?  ? Levaquin [Levofloxacin]   ? ? ?Allergies as of 03/24/2022   ? ?   Reactions  ? Codeine Rash  ? Penicillins Rash  ? Has patient had a PCN reaction causing immediate rash, facial/tongue/throat swelling, SOB or lightheadedness with hypotension: No ?Has patient had a PCN reaction causing severe rash involving mucus membranes or skin necrosis: No ?Has patient had a PCN reaction that required hospitalization: No ?Has patient had a PCN reaction occurring within the last 10 years: No ?If all of the above answers are "NO", then may proceed with Cephalosporin use. ?Tolerated Cephalosporin 10/31/20  ? Levaquin [levofloxacin]   ? ?  ? ?  ?Medication List  ?  ? ?  ? Accurate as of March 24, 2022 11:51 AM. If you have any questions, ask your nurse or doctor.  ?  ?  ? ?  ? ?acetaminophen 500 MG tablet ?Commonly known as: TYLENOL ?Take 1,000 mg by mouth 2 (two) times daily. ?  ?CALCIUM 500 PO ?Take 500 mg by mouth daily. ?  ?furosemide 20 MG tablet ?Commonly known as: LASIX ?Take 20 mg by mouth every Monday, Tuesday, Wednesday, Thursday, and Friday. ?  ?gabapentin 100 MG capsule ?Commonly known as: NEURONTIN ?Take 100 mg by mouth at bedtime. ?  ?ibandronate 150 MG tablet ?Commonly known as: BONIVA ?Take 150 mg by mouth every 30 (thirty) days. Take in the morning with a full glass of water, on an empty stomach, and do not take anything else by mouth or lie down for the next 30 min. ?  ?levETIRAcetam 500 MG tablet ?Commonly known as: KEPPRA ?Take  500 mg by mouth daily. ?  ?levothyroxine 75 MCG tablet ?Commonly known as: SYNTHROID ?Take 1 tablet (75 mcg total) by mouth daily before breakfast. ?  ?LORazepam 0.5 MG tablet ?Commonly known as: Ativan ?Take 0.5 tablets (0.25 mg total) by mouth 2 (two) times daily as needed for anxiety. ?  ?mupirocin ointment 2 % ?Commonly known as: BACTROBAN ?Apply 1 application topically 2 (two) times daily. ?  ?naproxen sodium 220 MG tablet ?Commonly known as: ALEVE ?Take 220 mg by mouth 2 (two) times daily as needed. ?  ?Olopatadine HCl 0.7 % Soln ?Apply to eye. ?  ?phenol 1.4 % Liqd ?Commonly known as: CHLORASEPTIC ?Use as directed 1 spray in the mouth  or throat every 4 (four) hours as needed for throat irritation / pain. ?  ?polyethylene glycol 17 g packet ?Commonly known as: MIRALAX / GLYCOLAX ?Take 17 g by mouth daily. ?  ?sennosides-docusate sodium 8.6-50 MG tablet ?Commonly known as: SENOKOT-S ?Take 1 tablet by mouth at bedtime as needed for constipation. ?  ?traMADol 50 MG tablet ?Commonly known as: ULTRAM ?Take 50 mg by mouth every 6 (six) hours as needed. ?  ?vitamin D3 50 MCG (2000 UT) Caps ?Take 2,000 Units by mouth daily. ?  ?zolpidem 5 MG tablet ?Commonly known as: AMBIEN ?Take 5 mg by mouth at bedtime as needed for sleep. ?  ? ?  ? ? ?Review of Systems  ?Constitutional:  Negative for activity change and appetite change.  ?HENT: Negative.    ?Respiratory:  Negative for cough and shortness of breath.   ?Cardiovascular:  Positive for leg swelling.  ?Gastrointestinal:  Negative for constipation.  ?Genitourinary: Negative.   ?Musculoskeletal:  Positive for arthralgias, back pain and myalgias. Negative for gait problem.  ?Skin: Negative.   ?Neurological:  Negative for dizziness and weakness.  ?Psychiatric/Behavioral:  Positive for sleep disturbance. Negative for confusion and dysphoric mood.   ? ?Immunization History  ?Administered Date(s) Administered  ? Influenza Split 08/11/2018, 08/03/2019  ? Influenza-Unspecified  09/01/2018, 09/10/2020, 09/18/2021  ? Moderna Sars-Covid-2 Vaccination 12/04/2019, 01/01/2020  ? Pneumococcal Conjugate-13 05/29/2014  ? Pneumococcal Polysaccharide-23 06/14/2015  ? Tdap 09/14/2012  ? Zoster

## 2022-04-22 ENCOUNTER — Ambulatory Visit (INDEPENDENT_AMBULATORY_CARE_PROVIDER_SITE_OTHER): Payer: Medicare Other | Admitting: Neurology

## 2022-04-22 ENCOUNTER — Encounter: Payer: Self-pay | Admitting: Neurology

## 2022-04-22 VITALS — BP 131/62 | HR 77 | Ht 59.0 in | Wt 108.0 lb

## 2022-04-22 DIAGNOSIS — I639 Cerebral infarction, unspecified: Secondary | ICD-10-CM

## 2022-04-22 DIAGNOSIS — R569 Unspecified convulsions: Secondary | ICD-10-CM | POA: Diagnosis not present

## 2022-04-22 NOTE — Progress Notes (Signed)
[facility]    Provider:  Dr Jaynee Eagles Referring Provider: Milagros Evener MD Primary Care Physician:  Milagros Evener MD  CC:  Normal neurocognitive testing  Follow-up Apr 22, 2022: Patient is seeing me today, I last saw her in 2019 and prior for memory complaints and neuropathy, more recently patient had a stroke and subsequent seizures, she was last seen in October 2022 about 6 months ago and she was overall doing well from a neurologic standpoint, she remained on Keppra XR 500 mg nightly without recurrent seizure activity and denied side effects, denied new or reoccurring stroke/TIA symptoms, patient declined antithrombotics or statins despite extensive education at prior visit, her neuropathy was greatly reduced on gabapentin, fortunately very mild residual symptoms from her stroke although she feels her overall gait has been slowly declining with increased knee pain and she continues to go to therapy and participates in exercise classes at friends home.  I reviewed chart, patient had a hospital admission in July 2021 with left-sided weakness, thought to be right sided brain TIA secondary to small vessel disease, she did have stroke risk factors including advanced age, former tobacco use, alcohol use, history of TIA per patient and family history of stroke, she then presented to the emergency room August 2021 with recurrence of left-sided weakness, left hemianopia along with severe frontal headache associated with photophobia and nausea, neurology was consulted who noted altered mental status with EEG reporting evidence of epileptogenicity arising from the right frontal region as well as cortical dysfunction in the right hemisphere likely secondary to underlying structural abnormality, postictal state, MRI was negative for acute abnormality, they initiated Keppra at that time.  Patient complains of symptoms per HPI as well as the following symptoms: arthritis, recent surgeries  for femur fracture causing gait abnormalities . Pertinent negatives and positives per HPI. All others negative   Interval history 03/30/2018: Patient completed formal memory testing and within complete normal limits. Patient doesn't seem to believe she is normal and still insists that she has memory problems. I tried to reassure her. In the future we can retest her. She still has paresthesias in her toes, in the top of her toes and the ball of her foot. Mostly at night, burning or stinging. Slowly progressive. No falls, walking is fine, no ataxia.   08/2016 for paresthesias:  ZANIYA MCAULAY is a 88 y.o. female here as a referral from [place], MD for paresthesias. 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 Unclear, tapered off prednisone without any recurrence, fibromyalgia, osteoarthritis, vasculitis, gluten intolerance, tinnitus, hypothyroidism, sleep disorder unspecified,  paresthesias of both feet. She is on B12 supplements 1500  daily.She has numbness in digit #2 on both feet. Started out mild and then it got worse. Wearing a bandaid helps. Burning in one toe. The rest of the foot is unaffected. It involves the whole top (not bottom) of the toe to the base of the toe doesn't extend past the base into the foot. Even touching the sheet on the toe hurts. She has to keep something in the bed to stop it from touching the toe. She wakes up and her toe hurts. Started on the top of the toe in the middle just in the top after wearing tight shoes, no other toe affected. She goes barefoot a lot because it hurts. It is affecting her sleep. Symptoms are symmetric bilaterally. No cramping in the feet.  No LBP or radicular symptoms. Started 2 years ago sporadically and slowly increased and now worse at night. Better with movement. She used to wear "wedge" shoes that pressed more on that  toe. No other focal neurologic deficits or concerns. No other modifying factors or associated symptoms.   Reviewed notes, labs and imaging from outside physicians, which showed:   BUN 17, creatinine 0.660 Jayne 13th 2017, TSH 0.370 05/12/2016.   XR foot: personally reviewed imaging and agree with the following:   IMPRESSION: No acute fracture or dislocation of the second toe is observed. No significant degenerative change is observed.  HPI:  SKYLYNN BURKLEY is a 88 y.o. female here as a referral from Dr. Lyndel Safe for cognitive decline. a 88 y.o. female here as a referral from [place], MD for paresthesias. 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 Unclear, tapered off prednisone without any recurrence, fibromyalgia, osteoarthritis, vasculitis, gluten intolerance, tinnitus, hypothyroidism, sleep disorder unspecified,  paresthesias of both feet. She has had multiple surgeries and feels her gait has changed, she "goes to the side" sometimes. She had physical therapy about it, may be related to her surgeries. She doesn't feels herself since the surgeries. She is in independent living. She had anesthesia twice and has felt more foggy since then. She forgets movie starts names for example, and then may remember it. It was concerning she forgot her grandson's name. She does feel the memory changes started before the surgeries. Slowly worsening, very slowly progressing. She feels she may need assisted living soon. Her father had dementia that started in his mid 29s, mother "sharp as tack". She is having difficulty sleeping, stopped [place] about 45 days ago and is on Melatonin. She feels her mood is well controlled, she has a female friend and has companionship.   Reviewed notes, labs and imaging from outside physicians, which showed:   Reviewed primary care notes.  Patient  concerned about memory, feels like her memory is getting fuzzy, at times harder to recall words and create sentences.  She has seen me in the past in 08/2016.  She is not getting confused or forgetting dates.  She feels her mood is good.    Review of Systems: Patient complains of symptoms per HPI as well as the following symptoms: eye pain, dizziness, decreased energy, foot pain, sensory changes. Pertinent negatives and positives per HPI. All others negative.   Social History   Socioeconomic History   Marital status: Widowed    Spouse name: Not on file   Number of children: 2   Years of education: RN   Highest education level: Not on file  Occupational History   Occupation: Retired   Tobacco Use   Smoking status: Former   Smokeless tobacco: Never   Tobacco comments:    Smoked from age [age]  Vaping Use   Vaping Use: Never used  Substance and Sexual Activity   Alcohol use: Not Currently    Comment: occasional glass of wine   Drug use: No   Sexual activity: Not Currently  Other Topics Concern   Not on file  Social History Narrative   Lives at [facility] independent [facility]    Caffeine use: occasional cup of coffee, 2-3 times a week   Left handed         Diet: Regular      Do you drink/ eat things with caffeine? No  Marital status: Widowed                              What year were you married ? 1986      Do you live in a house, apartment,assistred living, condo, trailer, etc.)? [place]       Is it one or more stories? 4 th floor      How many persons live in your home ? 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  Yes                            Type & how often 5-6 x WK      ADVANCED DIRECTIVES (Please bring copies)      Do you have a living will? Yes      Do you have a DNR form?   Yes                     If not, do you want to discuss one?       Do you have signed POA?HPOA forms?   Yes              If so, please bring to your appointment      FUNCTIONAL STATUS- To be completed by Spouse / child / Staff       Do you have difficulty bathing or dressing yourself ? No      Do you have difficulty preparing food or eating ?  No      Do you have difficulty managing your mediation ?No      Do you have difficulty managing your finances ? No      Do you have difficulty affording your medication ? No      Social Determinants of Radio broadcast assistant Strain: Not on file  Food Insecurity: Not on file  Transportation Needs: Not on file  Physical Activity: Not on file  Stress: Not on file  Social Connections: Not on file  Intimate Partner Violence: Not on file    Family History  Problem Relation Age of Onset   Asthma Mother    Pulmonary embolism Mother    Macular degeneration Mother    Heart disease Father    Stroke Father    Stroke Paternal Uncle    Breast cancer Maternal Aunt    Macular degeneration Sister    Stroke Sister    Neuropathy Neg Hx     Past Medical History:  Diagnosis Date   Abdominal pain 02/26/2017   Anxiety    Arthritis    Left Knee, Wrist, Back and Hips   Cervical vertebral fusion    Diverticulitis    Diverticulosis    Fibromyalgia    GERD (gastroesophageal reflux disease)    Gout 02/2018   L hand   Hypertension     after d/c from hospital no current problems or medication   Hypothyroidism    Pelvis fracture ([place])    Peritoneal free air 03/29/2012   Pneumonia    Pneumoperitoneum 02/26/2017   Pre-diabetes    Seizure (HCC)    Thyroid disorder    Toe  pain 09/06/2016    Past Surgical History:  Procedure Laterality Date   ABDOMINAL HYSTERECTOMY     BOWEL RESECTION N/A 07/09/2017   Procedure: SMALL BOWEL RESECTION;  Surgeon: Johnathan Hausen, MD;  Location: [facility];  Service: General;  Laterality: N/A;   CERVICAL FUSION     x2    CONVERSION TO TOTAL HIP Left 10/31/2020   Procedure: CONVERSION TO ANTERIOR TOTAL HIP;  Surgeon: Paralee Cancel, MD;  Location: [facility];  Service: Orthopedics;  Laterality: Left;  2 hrs   FEMUR IM NAIL Left 03/03/2020   Procedure: INTRAMEDULLARY (IM) NAIL FEMORAL;  Surgeon: Paralee Cancel, MD;  Location: [facility];  Service: Orthopedics;  Laterality: Left;   LAPAROSCOPIC APPENDECTOMY N/A 03/04/2017   Procedure: APPENDECTOMY LAPAROSCOPIC;  Surgeon: Johnathan Hausen, MD;  Location: [facility];  Service: General;  Laterality: N/A;   LAPAROSCOPY N/A 03/04/2017   Procedure: LAPAROSCOPY DIAGNOSTIC, ENTEROLYSIS;  Surgeon: Johnathan Hausen, MD;  Location: [facility];  Service: General;  Laterality: N/A;   LAPAROTOMY N/A 07/09/2017   Procedure: EXPLORATORY LAPAROTOMY;  Surgeon: Johnathan Hausen, MD;  Location: [facility];  Service: General;  Laterality: N/A;   SPINE SURGERY     Lumbar- rod placement   TONSILLECTOMY     88y/o   TOTAL VAGINAL HYSTERECTOMY      Current Outpatient Medications  Medication Sig Dispense Refill   acetaminophen (TYLENOL) 500 MG tablet Take 500 mg by mouth daily.     Calcium Carbonate (CALCIUM 500 PO) Take 500 mg by mouth daily.      furosemide (LASIX) 20 MG tablet Take 20 mg by mouth daily.     ibandronate (BONIVA) 150 MG tablet Take 150 mg by mouth every 30 (thirty) days. Take in the morning with a full glass of water, on an empty stomach, and do not take anything else by mouth or lie down for the next 30 min.     levETIRAcetam (KEPPRA XR) 500 MG 24 hr tablet Take 1 tablet (500 mg total) by mouth daily. 90 tablet 4   levETIRAcetam (KEPPRA) 500 MG tablet Take 500 mg by mouth daily.      levothyroxine (SYNTHROID) 75 MCG tablet Take 1 tablet (75 mcg total) by mouth daily before breakfast. 30 tablet 1   LORazepam (ATIVAN) 0.5 MG tablet Take 0.5 tablets (0.25 mg total) by mouth 2 (two) times daily as needed for anxiety. 30 tablet 0   mupirocin ointment (BACTROBAN) 2 % Apply 1 application. topically 2 (two)  times daily as needed.     naproxen sodium (ALEVE) 220 MG tablet Take 220 mg by mouth 2 (two) times daily as needed.     Olopatadine HCl 0.7 % SOLN Apply to eye.     phenol (CHLORASEPTIC) 1.4 % LIQD Use as directed 1 spray in the mouth or throat every 4 (four) hours as needed for throat irritation / pain.     polyethylene glycol (MIRALAX / GLYCOLAX) 17 g packet Take 17 g by mouth daily.      sennosides-docusate sodium (SENOKOT-S) 8.6-50 MG tablet Take 1 tablet by mouth at bedtime as needed for constipation.     traMADol (ULTRAM) 50 MG tablet Take 50 mg by mouth every 6 (six) hours as needed.     Vitamin D, Cholecalciferol, 50 MCG (2000 UT) CAPS Take 2,000 Units by mouth daily.      zolpidem (AMBIEN) 5 MG tablet Take 5 mg by mouth at bedtime as needed for sleep.     gabapentin (NEURONTIN) 100 MG capsule  Take 1 capsule (100 mg total) by mouth at bedtime. 270 capsule 3   No current facility-administered medications for this visit.    Allergies as of 04/22/2022 - Review Complete 04/22/2022  Allergen Reaction Noted   Codeine Rash 03/25/2012   Penicillins Rash 07/01/2017   Levaquin [levofloxacin]  03/18/2020    Vitals: BP 131/62   Pulse 77   Ht '4\' 11"'$  (1.499 m)   Wt 108 lb (49 kg)   BMI 21.81 kg/m  Last Weight:  Wt Readings from Last 1 Encounters:  04/22/22 108 lb (49 kg)   Last Height:   Ht Readings from Last 1 Encounters:  04/22/22 '4\' 11"'$  (1.499 m)       11/20/2021   10:03 AM 12/01/2017    9:00 AM  MMSE - Mini Mental State Exam  Orientation to time 5 5  Orientation to Place 5 5  Registration 3 3  Attention/ Calculation 5 5  Recall 3 3  Language- name 2 objects 2 2  Language- repeat 1 1  Language- follow 3 step command 3 3  Language- read & follow direction 1 1  Write a sentence 1 1  Copy design 1 1  Total score 30 30   Exam: NAD, pleasant                  Speech:    Speech is normal; fluent and spontaneous with normal comprehension.  Cognition:    The patient is  oriented to person, place, and time;     recent and remote memory intact;     language fluent;    Cranial Nerves:    The pupils are equal, round, and reactive to light.Trigeminal sensation is intact and the muscles of mastication are normal. The face is symmetric. The palate elevates in the midline. Hearing intact. Voice is normal. Shoulder shrug is normal. The tongue has normal motion without fasciculations.   Coordination:  No dysmetria  Motor Observation:    No asymmetry, no atrophy, and no involuntary movements noted. Tone:    Normal muscle tone.     Strength:    Left sided generalized weakness but difficult exam due to arthritic pain and recent surgeries     Sensation: intact to LT  Gait: Gait, slow, stooped, antalgic, multiple arthritc problems s/p orthopaedic surgery     Assessment/Plan:   88 y.o. female here as a referral from Dr. Radene Ou whom I have seen in the past fog perceived cognitive difficulties here for TIA (likely MRI negative stroke) followed by seizures. 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, episode of confusion possibly vasculitis (Unclear, tapered off prednisone without any recurrence), fibromyalgia, osteoarthritis, vasculitis, gluten intolerance, tinnitus, hypothyroidism, sleep disorder unspecified, Patient had a hospital admission in July 2021 with left-sided weakness, thought to be right sided brain TIA secondary to small vessel disease, she did have stroke risk factors including advanced age, former tobacco use, alcohol use, history of TIA per patient and family history of stroke, she then presented to the emergency room August 2021 with recurrence of left-sided weakness, left hemianopia along with severe frontal headache associated with photophobia and nausea, neurology was consulted who noted altered mental status with EEG reporting evidence of epileptogenicity  arising from the right frontal region as well as cortical dysfunction in the right hemisphere likely secondary to underlying structural abnormality, postictal state, MRI was negative for acute abnormality, they initiated Keppra at that time.  R brain TIA : Likely MRI negative stroke as she continues to have left-sided weakness Previously on aspirin 81 mg daily  and atorvastatin but facility d/c'd per patient request - again educated on indication for antithrombotic and statin especially as unable to completely rule out TIA -difficulty tolerating aspirin and discussed use of Plavix but continues to declines -verbalizes understanding of increased risk.  Also declines use of statin therapy. 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  Well-controlled managed by PCP/facility HLD: LDL goal<70.  Declines use of statin therapy. Request routine monitoring per facility  I had a long d/w patient about her recent stroke, risk for recurrent stroke/TIAs, personally independently reviewed imaging studies and stroke evaluation results and answered questions. Refuses statins or asa/plavix for secondary stroke prevention Seizures:  Stable without recurring symptoms or seizures Continue Keppra XR 500 mg daily for seizure prophylaxis, return to primcary care BLE neuropathy:Continue gabapentin with great improvement - managed by ALF Gait impairment: likely multifactorial in setting of deconditioning, age, multiple joint pain and worsening left knee pain.  Encouraged continued HEP and participation with exercise classes 3 times weekly.  Discussed importance of use of rolling walker at all times   - Patient completed formal memory testing and within  complete normal limits. Patient doesn't seem to believe she is normal and still insists that she has memory problems. I tried to reassure her. In the future we can retest her.   - She still has paresthesias in her toes, in the top of her toes and the ball of her foot. Mostly at night, burning or stinging. Slowly progressive. Had extensive serum testing. Continue gabapentin. She declines anything further.    Meds ordered this encounter  Medications   gabapentin (NEURONTIN) 100 MG capsule    Sig: Take 1 capsule (100 mg total) by mouth at bedtime.    Dispense:  270 capsule    Refill:  3   levETIRAcetam (KEPPRA XR) 500 MG 24 hr tablet    Sig: Take 1 tablet (500 mg total) by mouth daily.    Dispense:  90 tablet    Refill:  [place], MD  [facility] [address] [place], [place] [address]  Phone [phone] Fax [phone]  I spent over 40 minutes of face-to-face and non-face-to-face time with patient on the  1. Seizures ([place])   2. Cerebrovascular accident (CVA), unspecified mechanism ([place])    diagnosis.  This included previsit chart review, lab review, study review, order entry, electronic health record documentation, patient education on the different diagnostic and therapeutic options, counseling and coordination of care, risks and benefits of management, compliance, or risk factor reduction

## 2022-04-27 MED ORDER — LEVETIRACETAM ER 500 MG PO TB24: 500.0000 mg | ORAL_TABLET | Freq: Every day | ORAL | 4 refills | Status: AC

## 2022-04-27 MED ORDER — GABAPENTIN 100 MG PO CAPS: 100.0000 mg | ORAL_CAPSULE | Freq: Every day | ORAL | 3 refills | Status: AC

## 2022-04-27 NOTE — Patient Instructions (Signed)
Continue keppra

## 2022-05-18 ENCOUNTER — Non-Acute Institutional Stay: Payer: Medicare Other | Admitting: Adult Health

## 2022-05-18 ENCOUNTER — Encounter: Payer: Self-pay | Admitting: Adult Health

## 2022-05-18 DIAGNOSIS — R569 Unspecified convulsions: Secondary | ICD-10-CM | POA: Diagnosis not present

## 2022-05-18 DIAGNOSIS — Z8673 Personal history of transient ischemic attack (TIA), and cerebral infarction without residual deficits: Secondary | ICD-10-CM

## 2022-05-18 DIAGNOSIS — Z66 Do not resuscitate: Secondary | ICD-10-CM | POA: Diagnosis not present

## 2022-05-18 NOTE — Progress Notes (Unsigned)
Location:  [facility] Room Number: AL/906/A Place of Service:  ALF [phone]) Provider:  Durenda Age, DNP, FNP-BC  Patient Care Team: [name], MD as PCP - General (Internal Medicine)  Extended Emergency Contact Information Primary Emergency Contact: Milon Dikes [place] of [place] Phone: [phone] Mobile Phone: [phone] Relation: Friend Secondary Emergency Contact: Rodman Key Address: [address]          [place] [address] Johnnette Litter of [place] Phone: [phone] Mobile Phone: [phone] Relation: Son  Code Status:  DNR  Goals of care: Advanced Directive information    05/18/2022    9:54 AM  Advanced Directives  Does Patient Have a Medical Advance Directive? Yes  Type of Advance Directive Living will;Out of facility DNR (pink MOST or yellow form)  Does patient want to make changes to medical advance directive? No - Patient declined  Pre-existing out of facility DNR order (yellow form or pink MOST form) Yellow form placed in chart (order not valid for inpatient use)     Chief Complaint  Patient presents with   Acute Visit    Patient is requesting ASA    HPI:  Pt is a 88 y.o. female seen today for medical management of chronic diseases.  ***   Past Medical History:  Diagnosis Date   Abdominal pain 02/26/2017   Anxiety    Arthritis    Left Knee, Wrist, Back and Hips   Cervical vertebral fusion    Diverticulitis    Diverticulosis    Fibromyalgia    GERD (gastroesophageal reflux disease)    Gout 02/2018   L hand   Hypertension     after d/c from hospital no current problems or medication   Hypothyroidism    Pelvis fracture ([place])    Peritoneal free air 03/29/2012   Pneumonia    Pneumoperitoneum 02/26/2017   Pre-diabetes    Seizure ([place])    Thyroid disorder    Toe pain 09/06/2016   Past Surgical History:  Procedure Laterality Date   ABDOMINAL HYSTERECTOMY      BOWEL RESECTION N/A 07/09/2017   Procedure: SMALL BOWEL RESECTION;  Surgeon: Johnathan Hausen, MD;  [facility]: [facility];  Service: General;  Laterality: N/A;   CERVICAL FUSION     x2   CONVERSION TO TOTAL HIP Left 10/31/2020   Procedure: CONVERSION TO ANTERIOR TOTAL HIP;  Surgeon: Paralee Cancel, MD;  [facility];  Service: Orthopedics;  Laterality: Left;  2 hrs   FEMUR IM NAIL Left 03/03/2020   Procedure: INTRAMEDULLARY (IM) NAIL FEMORAL;  Surgeon: Paralee Cancel, MD;  [facility];  Service: Orthopedics;  Laterality: Left;   LAPAROSCOPIC APPENDECTOMY N/A 03/04/2017   Procedure: APPENDECTOMY LAPAROSCOPIC;  Surgeon: Johnathan Hausen, MD;  [facility];  Service: General;  Laterality: N/A;   LAPAROSCOPY N/A 03/04/2017   Procedure: LAPAROSCOPY DIAGNOSTIC, ENTEROLYSIS;  Surgeon: Johnathan Hausen, MD;  [facility];  Service: General;  Laterality: N/A;   LAPAROTOMY N/A 07/09/2017   Procedure: EXPLORATORY LAPAROTOMY;  Surgeon: Johnathan Hausen, MD;  [facility];  Service: General;  Laterality: N/A;   SPINE SURGERY     Lumbar- rod placement   TONSILLECTOMY     88y/o   TOTAL VAGINAL HYSTERECTOMY      Allergies  Allergen Reactions   Codeine Rash   Penicillins Rash    Has patient had a PCN reaction causing immediate rash, facial/tongue/throat swelling,  SOB or lightheadedness with hypotension: No Has patient had a PCN reaction causing severe rash involving mucus membranes or skin necrosis: No Has patient had a PCN reaction that required hospitalization: No Has patient had a PCN reaction occurring within the last 10 years: No If all of the above answers are "NO", then may proceed with Cephalosporin use.  Tolerated Cephalosporin 10/31/20     Levaquin [Levofloxacin]     Outpatient Encounter Medications as of 05/18/2022  Medication Sig   acetaminophen (TYLENOL) 500 MG tablet Take 500 mg by mouth daily.   Calcium Carbonate (CALCIUM 500 PO) Take 500 mg by mouth daily.    furosemide  (LASIX) 20 MG tablet Take 20 mg by mouth daily.   gabapentin (NEURONTIN) 100 MG capsule Take 1 capsule (100 mg total) by mouth at bedtime.   ibandronate (BONIVA) 150 MG tablet Take 150 mg by mouth every 30 (thirty) days. Take in the morning with a full glass of water, on an empty stomach, and do not take anything else by mouth or lie down for the next 30 min.   levETIRAcetam (KEPPRA XR) 500 MG 24 hr tablet Take 1 tablet (500 mg total) by mouth daily.   levETIRAcetam (KEPPRA) 500 MG tablet Take 500 mg by mouth daily.    levothyroxine (SYNTHROID) 75 MCG tablet Take 1 tablet (75 mcg total) by mouth daily before breakfast.   LORazepam (ATIVAN) 0.5 MG tablet Take 0.5 tablets (0.25 mg total) by mouth 2 (two) times daily as needed for anxiety.   mupirocin ointment (BACTROBAN) 2 % Apply 1 application. topically 2 (two) times daily as needed.   naproxen sodium (ALEVE) 220 MG tablet Take 220 mg by mouth 2 (two) times daily as needed.   Olopatadine HCl 0.7 % SOLN Apply to eye.   phenol (CHLORASEPTIC) 1.4 % LIQD Use as directed 1 spray in the mouth or throat every 4 (four) hours as needed for throat irritation / pain.   polyethylene glycol (MIRALAX / GLYCOLAX) 17 g packet Take 17 g by mouth daily.    sennosides-docusate sodium (SENOKOT-S) 8.6-50 MG tablet Take 1 tablet by mouth at bedtime as needed for constipation.   traMADol (ULTRAM) 50 MG tablet Take 50 mg by mouth every 6 (six) hours as needed.   Vitamin D, Cholecalciferol, 50 MCG (2000 UT) CAPS Take 2,000 Units by mouth daily.    zolpidem (AMBIEN) 5 MG tablet Take 5 mg by mouth at bedtime as needed for sleep.   No facility-administered encounter medications on file as of 05/18/2022.    Review of Systems ***    Immunization History  Administered Date(s) Administered   Influenza Split 08/11/2018, 08/03/2019   Influenza-Unspecified 09/01/2018, 09/10/2020, 09/18/2021   Moderna Sars-Covid-2 Vaccination 12/04/2019, 01/01/2020   Pneumococcal  Conjugate-13 05/29/2014   Pneumococcal Polysaccharide-23 06/14/2015   Tdap 09/14/2012   Zoster Recombinat (Shingrix) 04/13/2018, 07/05/2018   Zoster, Live 04/30/2004, 04/13/2018, 07/05/2018   Pertinent  Health Maintenance Due  Topic Date Due   DEXA SCAN  01/30/2023 (Originally 10/25/1997)   INFLUENZA VACCINE  06/30/2022      11/02/2020    8:00 PM 11/03/2020    8:35 AM 11/03/2020    9:15 PM 11/04/2020   12:00 PM 04/23/2021   11:34 AM  Fall Risk  Patient Fall Risk Level High fall risk High fall risk High fall risk High fall risk Moderate fall risk     Vitals:   05/18/22 0955  BP: 120/66  Pulse: 64  Resp: 20  Temp: 98.7  F (37.1 C)  SpO2: 98%  Weight: 102 lb 6.4 oz (46.4 kg)  Height: 5' (1.524 m)   Body mass index is 20 kg/m.  Physical Exam     Labs reviewed: Recent Labs    07/24/21 0000 10/08/21 0000 02/06/22 0000 02/20/22 0000  NA 131* 134* 133* 133*  K 4.6  --  4.5 4.5  CL 100 99 99 102  CO2 25* 22 19 24*  BUN '11 20 18 15  '$ CREATININE 0.6 0.7 0.7 0.7  CALCIUM 9.1 9.9 9.7 9.0   Recent Labs    10/08/21 0000 02/06/22 0000 02/20/22 0000  AST '15 20 18  '$ ALT '9 12 9  '$ ALKPHOS 46 44 40  ALBUMIN 3.9 3.9 3.7   Recent Labs    07/11/21 0000 10/08/21 0000  WBC 6.8 5.4  NEUTROABS 5,535.00 2,905.00  HGB 11.6* 12.8  HCT 34* 38  PLT 389 479*   Lab Results  Component Value Date   TSH 2.17 10/08/2021   Lab Results  Component Value Date   HGBA1C 5.8 (H) 10/21/2020   Lab Results  Component Value Date   CHOL 171 06/20/2020   HDL 59 06/20/2020   LDLCALC 94 06/20/2020   TRIG 89 06/20/2020   CHOLHDL 2.9 06/20/2020    Significant Diagnostic Results in last 30 days:  No results found.  Assessment/Plan ***   Family/ staff Communication: Discussed plan of care with resident and charge nurse  Labs/tests ordered:     Durenda Age, DNP, MSN, FNP-BC [facility] [phone] (Monday-Friday 8:00 a.m. - 5:00  p.m.) [phone] (after hours)

## 2022-05-21 LAB — BASIC METABOLIC PANEL
CO2: 25 — AB (ref 13–22)
Chloride: 102 (ref 99–108)
Creatinine: 0.7 (ref 0.5–1.1)
Glucose: 84
Potassium: 4.6 mEq/L (ref 3.5–5.1)
Sodium: 135 — AB (ref 137–147)

## 2022-05-21 LAB — COMPREHENSIVE METABOLIC PANEL
Calcium: 9.6 (ref 8.7–10.7)
eGFR: 84

## 2022-05-25 ENCOUNTER — Non-Acute Institutional Stay: Payer: Medicare Other | Admitting: Adult Health

## 2022-05-25 ENCOUNTER — Encounter: Payer: Self-pay | Admitting: Adult Health

## 2022-05-25 DIAGNOSIS — Z8673 Personal history of transient ischemic attack (TIA), and cerebral infarction without residual deficits: Secondary | ICD-10-CM | POA: Diagnosis not present

## 2022-05-25 DIAGNOSIS — I10 Essential (primary) hypertension: Secondary | ICD-10-CM

## 2022-05-25 DIAGNOSIS — H9193 Unspecified hearing loss, bilateral: Secondary | ICD-10-CM | POA: Diagnosis not present

## 2022-05-25 DIAGNOSIS — G629 Polyneuropathy, unspecified: Secondary | ICD-10-CM

## 2022-05-25 DIAGNOSIS — R569 Unspecified convulsions: Secondary | ICD-10-CM

## 2022-05-25 DIAGNOSIS — E039 Hypothyroidism, unspecified: Secondary | ICD-10-CM

## 2022-06-09 LAB — COMPREHENSIVE METABOLIC PANEL
Calcium: 9.9 (ref 8.7–10.7)
eGFR: 68

## 2022-06-09 LAB — CBC AND DIFFERENTIAL
HCT: 40 (ref 36–46)
Hemoglobin: 13.7 (ref 12.0–16.0)
Neutrophils Absolute: 4195
Platelets: 384 10*3/uL (ref 150–400)
WBC: 6.9

## 2022-06-09 LAB — BASIC METABOLIC PANEL
BUN: 16 (ref 4–21)
CO2: 26 — AB (ref 13–22)
Chloride: 98 — AB (ref 99–108)
Creatinine: 0.8 (ref 0.5–1.1)
Glucose: 87
Potassium: 4.4 mEq/L (ref 3.5–5.1)
Sodium: 134 — AB (ref 137–147)

## 2022-06-09 LAB — CBC: RBC: 4.21 (ref 3.87–5.11)

## 2022-06-29 ENCOUNTER — Encounter: Payer: Self-pay | Admitting: Nurse Practitioner

## 2022-06-29 ENCOUNTER — Non-Acute Institutional Stay: Payer: Medicare Other | Admitting: Nurse Practitioner

## 2022-06-29 DIAGNOSIS — E039 Hypothyroidism, unspecified: Secondary | ICD-10-CM

## 2022-06-29 DIAGNOSIS — F419 Anxiety disorder, unspecified: Secondary | ICD-10-CM

## 2022-06-29 DIAGNOSIS — Z8673 Personal history of transient ischemic attack (TIA), and cerebral infarction without residual deficits: Secondary | ICD-10-CM

## 2022-06-29 DIAGNOSIS — E871 Hypo-osmolality and hyponatremia: Secondary | ICD-10-CM | POA: Diagnosis not present

## 2022-06-29 DIAGNOSIS — K219 Gastro-esophageal reflux disease without esophagitis: Secondary | ICD-10-CM | POA: Diagnosis not present

## 2022-06-29 DIAGNOSIS — F5105 Insomnia due to other mental disorder: Secondary | ICD-10-CM

## 2022-06-29 DIAGNOSIS — R569 Unspecified convulsions: Secondary | ICD-10-CM

## 2022-06-29 DIAGNOSIS — M159 Polyosteoarthritis, unspecified: Secondary | ICD-10-CM

## 2022-06-29 DIAGNOSIS — G2581 Restless legs syndrome: Secondary | ICD-10-CM

## 2022-06-29 DIAGNOSIS — I872 Venous insufficiency (chronic) (peripheral): Secondary | ICD-10-CM

## 2022-06-29 DIAGNOSIS — K5901 Slow transit constipation: Secondary | ICD-10-CM

## 2022-06-29 DIAGNOSIS — M150 Primary generalized (osteo)arthritis: Secondary | ICD-10-CM

## 2022-06-29 DIAGNOSIS — M8000XS Age-related osteoporosis with current pathological fracture, unspecified site, sequela: Secondary | ICD-10-CM

## 2022-06-29 NOTE — Progress Notes (Addendum)
Location:   [facility] Room Number: [place] of Service:  ALF [phone]) Provider:  Yerachmiel Spinney X, NP  [name], MD  Patient Care Team: [name], MD as PCP - General (Internal Medicine)  Extended Emergency Contact Information Primary Emergency Contact: Milon Dikes [place] of [place] Phone: [phone] Mobile Phone: [phone] Relation: Friend Secondary Emergency Contact: Rodman Key Address: [address]          [place] [address] Johnnette Litter of [place] Phone: [phone] Mobile Phone: [phone] Relation: Son  Code Status:  DNR Goals of care: Advanced Directive information    06/29/2022    2:42 PM  Advanced Directives  Does Patient Have a Medical Advance Directive? Yes  Type of Advance Directive Living will;Out of facility DNR (pink MOST or yellow form)  Does patient want to make changes to medical advance directive? No - Patient declined  Pre-existing out of facility DNR order (yellow form or pink MOST form) Yellow form placed in chart (order not valid for inpatient use);Pink MOST form placed in chart (order not valid for inpatient use)     Chief Complaint  Patient presents with  . Medical Management of Chronic Issues    Routine follow up  . Immunizations    COVID vaccine    HPI:  Pt is a 88 y.o. female seen today for medical management of chronic diseases.    Edema BLE, minimal welling,  takes Furosemide, 06/20/20 EF 60-65%             RLS takes Gabapentin,  off Requip.             Seizure, saw neurology. Hospitalized 8/12-8/19 for seizure with presentation of left sided weakness again. EEG showed focal seizure right frontal region. Takes Keppra 536m qd.              Hypothyroidism, takes Levothyroxine, TSH 2.17 10/08/21             Hyponatremia, stable, Na 133 02/20/22             Anxiety, not sleeping well, prn Lorazepam, Zolpidem, failed Mirtazapine             GERD, takes  Omeprazole. Hgb 12.8 10/07/21             Constipation, takes MiraLax, Senokot S             OA, left hip, s/p total left hip, multiple sites, takes Tylenol, Aleve, Gabapentin, Tramadol.  failed Methocarbamol recommended by Ortho-felt wobbly.              OP Boniva, Ca, Vit D, declined DEXA, Vit D 16 10/07/21.              TIA, off  Atorvastatin per patient's request,  on ASA   Past Medical History:  Diagnosis Date  . Abdominal pain 02/26/2017  . Anxiety   . Arthritis    Left Knee, Wrist, Back and Hips  . Cervical vertebral fusion   . Diverticulitis   . Diverticulosis   . Fibromyalgia   . GERD (gastroesophageal reflux disease)   . Gout 02/2018   L hand  . Hypertension     after d/c from hospital no current problems or medication  . Hypothyroidism   . [place]   . Peritoneal free air 03/29/2012  . Pneumonia   . Pneumoperitoneum 02/26/2017  .  Pre-diabetes   . Seizure ([place])   . Thyroid disorder   . Toe pain 09/06/2016   Past Surgical History:  Procedure Laterality Date  . ABDOMINAL HYSTERECTOMY    . BOWEL RESECTION N/A 07/09/2017   Procedure: SMALL BOWEL RESECTION;  Surgeon: Johnathan Hausen, MD;  [facility];  Service: General;  Laterality: N/A;  . CERVICAL FUSION     x2  . CONVERSION TO TOTAL HIP Left 10/31/2020   Procedure: CONVERSION TO ANTERIOR TOTAL HIP;  Surgeon: Paralee Cancel, MD;  [facility];  Service: Orthopedics;  Laterality: Left;  2 hrs  . FEMUR IM NAIL Left 03/03/2020   Procedure: INTRAMEDULLARY (IM) NAIL FEMORAL;  Surgeon: Paralee Cancel, MD;  [facility];  Service: Orthopedics;  Laterality: Left;  . LAPAROSCOPIC APPENDECTOMY N/A 03/04/2017   Procedure: APPENDECTOMY LAPAROSCOPIC;  Surgeon: Johnathan Hausen, MD;  [facility];  Service: General;  Laterality: N/A;  . LAPAROSCOPY N/A 03/04/2017   Procedure: LAPAROSCOPY DIAGNOSTIC, ENTEROLYSIS;  Surgeon: Johnathan Hausen, MD;  [facility];  Service: General;  Laterality: N/A;  . LAPAROTOMY N/A  07/09/2017   Procedure: EXPLORATORY LAPAROTOMY;  Surgeon: Johnathan Hausen, MD;  [facility];  Service: General;  Laterality: N/A;  . SPINE SURGERY     Lumbar- rod placement  . TONSILLECTOMY     88y/o  . TOTAL VAGINAL HYSTERECTOMY      Allergies  Allergen Reactions  . Codeine Rash  . Penicillins Rash    Has patient had a PCN reaction causing immediate rash, facial/tongue/throat swelling, SOB or lightheadedness with hypotension: No Has patient had a PCN reaction causing severe rash involving mucus membranes or skin necrosis: No Has patient had a PCN reaction that required hospitalization: No Has patient had a PCN reaction occurring within the last 10 years: No If all of the above answers are "NO", then may proceed with Cephalosporin use.  Tolerated Cephalosporin 10/31/20    . Levaquin [Levofloxacin]     Allergies as of 06/29/2022       Reactions   Codeine Rash   Penicillins Rash   Has patient had a PCN reaction causing immediate rash, facial/tongue/throat swelling, SOB or lightheadedness with hypotension: No Has patient had a PCN reaction causing severe rash involving mucus membranes or skin necrosis: No Has patient had a PCN reaction that required hospitalization: No Has patient had a PCN reaction occurring within the last 10 years: No If all of the above answers are "NO", then may proceed with Cephalosporin use. Tolerated Cephalosporin 10/31/20   Levaquin [levofloxacin]         Medication List        Accurate as of June 29, 2022 11:59 PM. If you have any questions, ask your nurse or doctor.          acetaminophen 500 MG tablet Commonly known as: TYLENOL Take 500 mg by mouth 2 (two) times daily as needed.   aspirin EC 81 MG tablet Take 81 mg by mouth daily. Swallow whole.   CALCIUM 500 PO Take 500 mg by mouth daily.   furosemide 20 MG tablet Commonly known as: LASIX Take 20 mg by mouth daily.   gabapentin 100 MG capsule Commonly known as:  NEURONTIN Take 1 capsule (100 mg total) by mouth at bedtime.   ibandronate 150 MG tablet Commonly known as: BONIVA Take 150 mg by mouth every 30 (thirty) days. Take in the morning with a full glass of water, on an empty stomach, and do not take anything else  by mouth or lie down for the next 30 min.   levETIRAcetam 500 MG 24 hr tablet Commonly known as: Keppra XR Take 1 tablet (500 mg total) by mouth daily.   levothyroxine 75 MCG tablet Commonly known as: SYNTHROID Take 1 tablet (75 mcg total) by mouth daily before breakfast.   LORazepam 0.5 MG tablet Commonly known as: Ativan Take 0.5 tablets (0.25 mg total) by mouth 2 (two) times daily as needed for anxiety.   mupirocin ointment 2 % Commonly known as: BACTROBAN Apply 1 application. topically 2 (two) times daily as needed.   naproxen sodium 220 MG tablet Commonly known as: ALEVE Take 220 mg by mouth 2 (two) times daily as needed.   Olopatadine HCl 0.7 % Soln Apply to eye daily as needed (One drop to each eye).   phenol 1.4 % Liqd Commonly known as: CHLORASEPTIC Use as directed 1 spray in the mouth or throat every 4 (four) hours as needed for throat irritation / pain.   polyethylene glycol 17 g packet Commonly known as: MIRALAX / GLYCOLAX Take 17 g by mouth daily.   sennosides-docusate sodium 8.6-50 MG tablet Commonly known as: SENOKOT-S Take 1 tablet by mouth at bedtime as needed for constipation.   traMADol 50 MG tablet Commonly known as: ULTRAM Take 50 mg by mouth every 6 (six) hours as needed.   vitamin D3 50 MCG (2000 UT) Caps Take 2,000 Units by mouth daily.   zolpidem 5 MG tablet Commonly known as: AMBIEN Take 5 mg by mouth at bedtime as needed for sleep.        Review of Systems  Constitutional:  Negative for fatigue, fever and unexpected weight change.  HENT:  Positive for hearing loss. Negative for congestion, rhinorrhea and trouble swallowing.   Eyes:  Negative for redness and visual disturbance.   Respiratory:  Positive for shortness of breath. Negative for cough and wheezing.        DOE  Cardiovascular:  Positive for leg swelling.  Gastrointestinal:  Negative for abdominal pain and constipation.  Genitourinary:  Negative for dysuria and urgency.       Bathroom trips 1-2/night  Musculoskeletal:  Positive for arthralgias, back pain and gait problem.       Left hip pain, s/p total left hip arthroplasty. Multiple sites aches in am, better as day goes by.   Skin:  Negative for color change.  Neurological:  Negative for seizures, weakness and headaches.       Restless legs at night.   Psychiatric/Behavioral:  Positive for sleep disturbance. Negative for behavioral problems. The patient is nervous/anxious.     Immunization History  Administered Date(s) Administered  . Influenza Split 08/11/2018, 08/03/2019  . Influenza-Unspecified 09/01/2018, 09/10/2020, 09/18/2021  . Moderna Sars-Covid-2 Vaccination 12/04/2019, 01/01/2020  . Pneumococcal Conjugate-13 05/29/2014  . Pneumococcal Polysaccharide-23 06/14/2015  . Tdap 09/14/2012  . Zoster Recombinat (Shingrix) 04/13/2018, 07/05/2018  . Zoster, Live 04/30/2004, 04/13/2018, 07/05/2018   Pertinent  Health Maintenance Due  Topic Date Due  . INFLUENZA VACCINE  06/30/2022  . DEXA SCAN  01/30/2023 (Originally 10/25/1997)      11/02/2020    8:00 PM 11/03/2020    8:35 AM 11/03/2020    9:15 PM 11/04/2020   12:00 PM 04/23/2021   11:34 AM  Fall Risk  Patient Fall Risk Level High fall risk High fall risk High fall risk High fall risk Moderate fall risk   Functional Status Survey:    Vitals:   06/29/22 1426  BP: 128/72  Pulse: 68  Resp: 17  Temp: 98.5 F (36.9 C)  SpO2: 96%  Weight: 102 lb (46.3 kg)  Height: 5' (1.524 m)   Body mass index is 19.92 kg/m. Physical Exam Constitutional:      Appearance: Normal appearance.  HENT:     Head: Normocephalic and atraumatic.     Nose: Nose normal.     Mouth/Throat:     Mouth: Mucous  membranes are moist.  Eyes:     Extraocular Movements: Extraocular movements intact.     Pupils: Pupils are equal, round, and reactive to light.  Cardiovascular:     Rate and Rhythm: Normal rate and regular rhythm.     Heart sounds: No murmur heard.    Comments: Weak left DP pulse, more symptomatic tingling sensation at night.  Pulmonary:     Effort: Pulmonary effort is normal.     Breath sounds: No rales.  Abdominal:     General: Bowel sounds are normal.     Palpations: Abdomen is soft.     Tenderness: There is no abdominal tenderness.  Musculoskeletal:        General: Tenderness present.     Cervical back: Normal range of motion and neck supple.     Right lower leg: Edema present.     Left lower leg: Edema present.     Comments: mild scoliosis. Hx of back surgeries x3 with left sciatica pain. s/p total left hip replacement. Trace edema BLE  Skin:    General: Skin is warm and dry.  Neurological:     General: No focal deficit present.     Mental Status: She is alert and oriented to person, place, and time. Mental status is at baseline.     Gait: Gait abnormal.     Comments: Using walker sometimes.   Psychiatric:        Mood and Affect: Mood normal.        Behavior: Behavior normal.        Thought Content: Thought content normal.        Judgment: Judgment normal.    Labs reviewed: Recent Labs    02/06/22 0000 02/20/22 0000 05/21/22 0000 06/09/22 0000  NA 133* 133* 135* 134*  K 4.5 4.5 4.6 4.4  CL 99 102 102 98*  CO2 19 24* 25* 26*  BUN 18 15  --  16  CREATININE 0.7 0.7 0.7 0.8  CALCIUM 9.7 9.0 9.6 9.9   Recent Labs    10/08/21 0000 02/06/22 0000 02/20/22 0000  AST '15 20 18  ' ALT '9 12 9  ' ALKPHOS 46 44 40  ALBUMIN 3.9 3.9 3.7   Recent Labs    07/11/21 0000 10/08/21 0000 06/09/22 0000  WBC 6.8 5.4 6.9  NEUTROABS 5,535.00 2,905.00 4,195.00  HGB 11.6* 12.8 13.7  HCT 34* 38 40  PLT 389 479* 384   Lab Results  Component Value Date   TSH 2.17 10/08/2021    Lab Results  Component Value Date   HGBA1C 5.8 (H) 10/21/2020   Lab Results  Component Value Date   CHOL 171 06/20/2020   HDL 59 06/20/2020   LDLCALC 94 06/20/2020   TRIG 89 06/20/2020   CHOLHDL 2.9 06/20/2020    Significant Diagnostic Results in last 30 days:  No results found.  Assessment/Plan Hypothyroidism Wants to take Levothyroxine 85mg qd, TSH 2.17 10/08/21, will update TSH then re-evaluate Levothyroxine dose.   Hyponatremia  stable, Na 133 02/20/22, update CMP/eGFR  Insomnia secondary to anxiety not  sleeping well, prn Lorazepam, Zolpidem, failed Mirtazapine  GERD (gastroesophageal reflux disease) takes Omeprazole. Hgb 12.8 10/07/21, update CBC/diff.   Slow transit constipation Stable, takes MiraLax, Senokot S  Osteoarthritis, multiple sites left hip, s/p total left hip, multiple sites, takes Tylenol, Aleve, Gabapentin, Tramadol.  failed Methocarbamol recommended by Ortho-felt wobbly. ambulates with walker.   Osteoporosis Boniva, Ca, Vit D, declined DEXA, Vit D 16 10/07/21.   History of TIA (transient ischemic attack)  off  Atorvastatin per patient's request,  on ASA    Seizures ([place]) saw neurology. Hospitalized 8/12-8/19 for seizure with presentation of left sided weakness again. EEG showed focal seizure right frontal region. Takes Keppra 545m qd.   Restless leg syndrome takes Gabapentin,  off Requip.  Venous insufficiency minimal welling,  takes Furosemide, 06/20/20 EF 60-65%     Family/ staff Communication: plan of care reviewed with the patient and charge nurse.   Labs/tests ordered:  CBC/diff, CMP/eGFR, TSH  Time spend 40 minutes.

## 2022-06-29 NOTE — Assessment & Plan Note (Signed)
off  Atorvastatin per patient's request, on ASA  

## 2022-06-29 NOTE — Assessment & Plan Note (Signed)
takes Omeprazole. Hgb 12.8 10/07/21, update CBC/diff.

## 2022-06-29 NOTE — Assessment & Plan Note (Signed)
stable, Na 133 02/20/22, update CMP/eGFR

## 2022-06-29 NOTE — Assessment & Plan Note (Signed)
Wants to take Levothyroxine 54mg qd, TSH 2.17 10/08/21, will update TSH then re-evaluate Levothyroxine dose.

## 2022-06-29 NOTE — Assessment & Plan Note (Signed)
takes Gabapentin,  off Requip.

## 2022-06-29 NOTE — Assessment & Plan Note (Signed)
Boniva, Ca, Vit D, declined DEXA, Vit D 16 10/07/21.  

## 2022-06-29 NOTE — Assessment & Plan Note (Signed)
not sleeping well, prn Lorazepam, Zolpidem, failed Mirtazapine

## 2022-06-29 NOTE — Assessment & Plan Note (Signed)
saw neurology. Hospitalized 8/12-8/19 for seizure with presentation of left sided weakness again. EEG showed focal seizure right frontal region. Takes Keppra '500mg'$  qd.

## 2022-06-29 NOTE — Assessment & Plan Note (Signed)
left hip, s/p total left hip, multiple sites, takes Tylenol, Aleve, Gabapentin, Tramadol.  failed Methocarbamol recommended by Ortho-felt wobbly. ambulates with walker.

## 2022-06-29 NOTE — Assessment & Plan Note (Signed)
Stable, takes MiraLax, Senokot S 

## 2022-06-29 NOTE — Assessment & Plan Note (Signed)
minimal welling,  takes Furosemide, 06/20/20 EF 60-65%

## 2022-07-02 LAB — BASIC METABOLIC PANEL
BUN: 19 (ref 4–21)
CO2: 26 — AB (ref 13–22)
Chloride: 99 (ref 99–108)
Creatinine: 0.7 (ref 0.5–1.1)
Glucose: 81
Potassium: 4.6 mEq/L (ref 3.5–5.1)
Sodium: 132 — AB (ref 137–147)

## 2022-07-02 LAB — HEPATIC FUNCTION PANEL
ALT: 12 U/L (ref 7–35)
AST: 19 (ref 13–35)
Alkaline Phosphatase: 43 (ref 25–125)
Bilirubin, Total: 0.6

## 2022-07-02 LAB — COMPREHENSIVE METABOLIC PANEL
Albumin: 3.8 (ref 3.5–5.0)
Calcium: 9.1 (ref 8.7–10.7)
Globulin: 2.1
eGFR: 79

## 2022-07-02 LAB — CBC AND DIFFERENTIAL
HCT: 35 — AB (ref 36–46)
Hemoglobin: 12.2 (ref 12.0–16.0)
Neutrophils Absolute: 2550
Platelets: 420 10*3/uL — AB (ref 150–400)
WBC: 5

## 2022-07-02 LAB — CBC: RBC: 3.66 — AB (ref 3.87–5.11)

## 2022-07-02 LAB — TSH: TSH: 1.3 (ref 0.41–5.90)

## 2022-08-25 ENCOUNTER — Other Ambulatory Visit: Payer: Self-pay

## 2022-08-25 MED ORDER — LORAZEPAM 0.5 MG PO TABS: 0.2500 mg | ORAL_TABLET | Freq: Two times a day (BID) | ORAL | 0 refills | Status: DC | PRN

## 2022-08-25 NOTE — Telephone Encounter (Signed)
Refill request received from pharmacy for Lorazepam 0.5 mg tablet twice a day prn. Lorazepam on med list is prescribed as 0.'5mg'$  take 0.25 mg twice a day.Please correct dosage or send to pharmacy as is.  Medication pended and sent to Man Bretta Bang, NP

## 2022-09-08 ENCOUNTER — Non-Acute Institutional Stay: Payer: Medicare Other | Admitting: Family Medicine

## 2022-09-08 DIAGNOSIS — I10 Essential (primary) hypertension: Secondary | ICD-10-CM

## 2022-09-08 DIAGNOSIS — Z8781 Personal history of (healed) traumatic fracture: Secondary | ICD-10-CM

## 2022-09-08 DIAGNOSIS — E785 Hyperlipidemia, unspecified: Secondary | ICD-10-CM | POA: Diagnosis not present

## 2022-09-08 DIAGNOSIS — G629 Polyneuropathy, unspecified: Secondary | ICD-10-CM

## 2022-09-08 DIAGNOSIS — G2581 Restless legs syndrome: Secondary | ICD-10-CM

## 2022-09-08 DIAGNOSIS — Z8673 Personal history of transient ischemic attack (TIA), and cerebral infarction without residual deficits: Secondary | ICD-10-CM

## 2022-09-08 DIAGNOSIS — E039 Hypothyroidism, unspecified: Secondary | ICD-10-CM

## 2022-09-08 NOTE — Progress Notes (Signed)
Provider:  Alain Honey, MD Location:      Place of Service:     PCP: [name], MD Patient Care Team: [name], MD as PCP - General (Internal Medicine)  Extended Emergency Contact Information Primary Emergency Contact: Milon Dikes [place] Phone: [phone] Mobile Phone: [phone] Relation: Friend Secondary Emergency Contact: Rodman Key Address: [address]          [place] [address] Johnnette Litter of [place] Phone: [phone] Mobile Phone: [phone] Relation: Son  Code Status:  Goals of Care: Advanced Directive information    06/29/2022    2:42 PM  Advanced Directives  Does Patient Have a Medical Advance Directive? Yes  Type of Advance Directive Living will;Out of facility DNR (pink MOST or yellow form)  Does patient want to make changes to medical advance directive? No - Patient declined  Pre-existing out of facility DNR order (yellow form or pink MOST form) Yellow form placed in chart (order not valid for inpatient use);Pink MOST form placed in chart (order not valid for inpatient use)      No chief complaint on file.   HPI: Patient is a 88 y.o. female seen today for medical management of chronic problems including restless leg syndrome, seizure disorder, hypothyroidism, anxiety, osteoarthritis, and prior TIA. I saw patient in her room and assisted living.  She had just watched a news program concerning strife and is real and seemed upset.  We had rather long discussion of philosophical issues concerning the is really state religion medical insurance and various other sundry subjects.  She is a former Marine scientist. Had previous history of partial small bowel resection and now has what she calls diverticular disease.  To prevent constipation since she uses MiraLAX and stool softeners. Also had a fall and fractured femur and ended up having a hip replacement.  She uses a walker for  ambulation. Other medical issues include bilateral lower extremity edema taking furosemide.  She has restless leg syndromes formally took Requip but now takes gabapentin.  Also has seizure disorder and takes Keppra. She needs hypnotic at night either lorazepam or zolpidem to help her sleep.  Past Medical History:  Diagnosis Date   Abdominal pain 02/26/2017   Anxiety    Arthritis    Left Knee, Wrist, Back and Hips   Cervical vertebral fusion    Diverticulitis    Diverticulosis    Fibromyalgia    GERD (gastroesophageal reflux disease)    Gout 02/2018   L hand   Hypertension     after d/c from hospital no current problems or medication   Hypothyroidism    Pelvis fracture ([place])    Peritoneal free air 03/29/2012   Pneumonia    Pneumoperitoneum 02/26/2017   Pre-diabetes    Seizure ([place])    Thyroid disorder    Toe pain 09/06/2016   Past Surgical History:  Procedure Laterality Date   ABDOMINAL HYSTERECTOMY     BOWEL RESECTION N/A 07/09/2017   Procedure: SMALL BOWEL RESECTION;  Surgeon: Johnathan Hausen, MD;  Location: [facility];  Service: General;  Laterality: N/A;   CERVICAL FUSION     x2   CONVERSION TO TOTAL HIP Left 10/31/2020   Procedure: CONVERSION TO ANTERIOR TOTAL HIP;  Surgeon: Paralee Cancel, MD;  Location: [facility];  Service: Orthopedics;  Laterality: Left;  2 hrs   FEMUR IM NAIL Left 03/03/2020   Procedure: INTRAMEDULLARY (IM)  NAIL FEMORAL;  Surgeon: Paralee Cancel, MD;  Location: [facility];  Service: Orthopedics;  Laterality: Left;   LAPAROSCOPIC APPENDECTOMY N/A 03/04/2017   Procedure: APPENDECTOMY LAPAROSCOPIC;  Surgeon: Johnathan Hausen, MD;  Location: [facility];  Service: General;  Laterality: N/A;   LAPAROSCOPY N/A 03/04/2017   Procedure: LAPAROSCOPY DIAGNOSTIC, ENTEROLYSIS;  Surgeon: Johnathan Hausen, MD;  Location: [facility];  Service: General;  Laterality: N/A;   LAPAROTOMY N/A 07/09/2017   Procedure: EXPLORATORY LAPAROTOMY;  Surgeon: Johnathan Hausen, MD;  Location: [facility];  Service:  General;  Laterality: N/A;   SPINE SURGERY     Lumbar- rod placement   TONSILLECTOMY     88y/o   TOTAL VAGINAL HYSTERECTOMY      reports that she has quit smoking. She has never used smokeless tobacco. She reports that she does not currently use alcohol. She reports that she does not use drugs. Social History   Socioeconomic History   Marital status: Widowed    Spouse name: Not on file   Number of children: 2   Years of education: RN   Highest education level: Not on file  Occupational History   Occupation: Retired   Tobacco Use   Smoking status: Former   Smokeless tobacco: Never   Tobacco comments:    Smoked from age [age]  Vaping Use   Vaping Use: Never used  Substance and Sexual Activity   Alcohol use: Not Currently    Comment: occasional glass of wine   Drug use: No   Sexual activity: Not Currently  Other Topics Concern   Not on file  Social History Narrative   Lives at [facility]    Caffeine use: occasional cup of coffee, 2-3 times a week   Left handed         Diet: Regular      Do you drink/ eat things with caffeine? No      Marital status: Widowed                              What year were you married ? 1986      Do you live in a house, apartment,assistred living, condo, trailer, etc.)? [place]       Is it one or more stories? 4 th floor      How many persons live in your home ? 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  Yes                            Type & how often 5-6 x WK      ADVANCED DIRECTIVES (Please bring copies)      Do you have a living will? Yes      Do you have a DNR form?   Yes                    If not, do you want to discuss one?       Do you have signed POA?HPOA forms?   Yes              If so, please bring to your appointment  FUNCTIONAL  STATUS- To be completed by Spouse / child / Staff       Do you have difficulty bathing or dressing yourself ? No      Do you have difficulty preparing food or eating ?  No      Do you have difficulty managing your mediation ?No      Do you have difficulty managing your finances ? No      Do you have difficulty affording your medication ? No      Social Determinants of Health   Financial Resource Strain: Not on file  Food Insecurity: Not on file  Transportation Needs: Not on file  Physical Activity: Not on file  Stress: Not on file  Social Connections: Not on file  Intimate Partner Violence: Not on file    Functional Status Survey:    Family History  Problem Relation Age of Onset   Asthma Mother    Pulmonary embolism Mother    Macular degeneration Mother    Heart disease Father    Stroke Father    Stroke Paternal Uncle    Breast cancer Maternal Aunt    Macular degeneration Sister    Stroke Sister    Neuropathy Neg Hx     Health Maintenance  Topic Date Due   COVID-19 Vaccine (3 - Moderna series) 02/26/2020   INFLUENZA VACCINE  06/30/2022   DEXA SCAN  01/30/2023 (Originally 10/25/1997)   TETANUS/TDAP  09/14/2022   Pneumonia Vaccine 8+ Years old  Completed   Zoster Vaccines- Shingrix  Completed   HPV VACCINES  Aged Out    Allergies  Allergen Reactions   Codeine Rash   Penicillins Rash    Has patient had a PCN reaction causing immediate rash, facial/tongue/throat swelling, SOB or lightheadedness with hypotension: No Has patient had a PCN reaction causing severe rash involving mucus membranes or skin necrosis: No Has patient had a PCN reaction that required hospitalization: No Has patient had a PCN reaction occurring within the last 10 years: No If all of the above answers are "NO", then may proceed with Cephalosporin use.  Tolerated Cephalosporin 10/31/20     Levaquin [Levofloxacin]     Outpatient Encounter Medications as of 09/08/2022  Medication Sig    acetaminophen (TYLENOL) 500 MG tablet Take 500 mg by mouth 2 (two) times daily as needed.   aspirin EC 81 MG tablet Take 81 mg by mouth daily. Swallow whole.   Calcium Carbonate (CALCIUM 500 PO) Take 500 mg by mouth daily.    furosemide (LASIX) 20 MG tablet Take 20 mg by mouth daily.   gabapentin (NEURONTIN) 100 MG capsule Take 1 capsule (100 mg total) by mouth at bedtime.   ibandronate (BONIVA) 150 MG tablet Take 150 mg by mouth every 30 (thirty) days. Take in the morning with a full glass of water, on an empty stomach, and do not take anything else by mouth or lie down for the next 30 min.   levETIRAcetam (KEPPRA XR) 500 MG 24 hr tablet Take 1 tablet (500 mg total) by mouth daily.   levothyroxine (SYNTHROID) 75 MCG tablet Take 1 tablet (75 mcg total) by mouth daily before breakfast.   LORazepam (ATIVAN) 0.5 MG tablet Take 0.5 tablets (0.25 mg total) by mouth 2 (two) times daily as needed for anxiety.   mupirocin ointment (BACTROBAN) 2 % Apply 1 application. topically 2 (two) times daily as needed.   naproxen sodium (ALEVE) 220 MG tablet Take 220 mg by mouth 2 (two)  times daily as needed.   Olopatadine HCl 0.7 % SOLN Apply to eye daily as needed (One drop to each eye).   phenol (CHLORASEPTIC) 1.4 % LIQD Use as directed 1 spray in the mouth or throat every 4 (four) hours as needed for throat irritation / pain.   polyethylene glycol (MIRALAX / GLYCOLAX) 17 g packet Take 17 g by mouth daily.    sennosides-docusate sodium (SENOKOT-S) 8.6-50 MG tablet Take 1 tablet by mouth at bedtime as needed for constipation.   traMADol (ULTRAM) 50 MG tablet Take 50 mg by mouth every 6 (six) hours as needed.   Vitamin D, Cholecalciferol, 50 MCG (2000 UT) CAPS Take 2,000 Units by mouth daily.    zolpidem (AMBIEN) 5 MG tablet Take 5 mg by mouth at bedtime as needed for sleep.   No facility-administered encounter medications on file as of 09/08/2022.    Review of Systems  Constitutional: Negative.   HENT:  Negative.    Respiratory: Negative.    Cardiovascular: Negative.   Gastrointestinal:  Positive for abdominal pain.  Genitourinary: Negative.   Musculoskeletal:  Positive for arthralgias and gait problem.  Psychiatric/Behavioral: Negative.    All other systems reviewed and are negative.   There were no vitals filed for this visit. There is no height or weight on file to calculate BMI. Physical Exam Vitals and nursing note reviewed.  Constitutional:      Appearance: Normal appearance.  HENT:     Head: Normocephalic.     Mouth/Throat:     Mouth: Mucous membranes are moist.     Pharynx: Oropharynx is clear.  Eyes:     Extraocular Movements: Extraocular movements intact.     Conjunctiva/sclera: Conjunctivae normal.     Pupils: Pupils are equal, round, and reactive to light.  Cardiovascular:     Rate and Rhythm: Normal rate. Rhythm irregular.     Heart sounds: Normal heart sounds.  Pulmonary:     Effort: Pulmonary effort is normal.     Breath sounds: Normal breath sounds.  Abdominal:     General: Bowel sounds are normal. There is distension.  Musculoskeletal:     Comments: Uses walker for ambulation  Neurological:     General: No focal deficit present.     Mental Status: She is alert and oriented to person, place, and time.  Psychiatric:        Mood and Affect: Mood normal.        Behavior: Behavior normal.     Labs reviewed: Basic Metabolic Panel: Recent Labs    02/06/22 0000 02/20/22 0000 05/21/22 0000 06/09/22 0000  NA 133* 133* 135* 134*  K 4.5 4.5 4.6 4.4  CL 99 102 102 98*  CO2 19 24* 25* 26*  BUN 18 15  --  16  CREATININE 0.7 0.7 0.7 0.8  CALCIUM 9.7 9.0 9.6 9.9   Liver Function Tests: Recent Labs    10/08/21 0000 02/06/22 0000 02/20/22 0000  AST '15 20 18  '$ ALT '9 12 9  '$ ALKPHOS 46 44 40  ALBUMIN 3.9 3.9 3.7   No results for input(s): "LIPASE", "AMYLASE" in the last 8760 hours. No results for input(s): "AMMONIA" in the last 8760  hours. CBC: Recent Labs    10/08/21 0000 06/09/22 0000  WBC 5.4 6.9  NEUTROABS 2,905.00 4,195.00  HGB 12.8 13.7  HCT 38 40  PLT 479* 384   Cardiac Enzymes: No results for input(s): "CKTOTAL", "CKMB", "CKMBINDEX", "TROPONINI" in the last 8760 hours. BNP: Invalid  input(s): "POCBNP" Lab Results  Component Value Date   HGBA1C 5.8 (H) 10/21/2020   Lab Results  Component Value Date   TSH 2.17 10/08/2021   Lab Results  Component Value Date   WIOMBTDH74 163 12/01/2017   Lab Results  Component Value Date   FOLATE 19.8 12/01/2017   No results found for: "IRON", "TIBC", "FERRITIN"  Imaging and Procedures obtained prior to SNF admission: CT ABDOMEN PELVIS WO CONTRAST  Result Date: 04/23/2021 CLINICAL DATA:  88 year old female with abdominal distension and pain. EXAM: CT ABDOMEN AND PELVIS WITHOUT CONTRAST TECHNIQUE: Multidetector CT imaging of the abdomen and pelvis was performed following the standard protocol without IV contrast. COMPARISON:  CT abdomen pelvis dated 02/26/2017. FINDINGS: Evaluation of this exam is limited in the absence of intravenous contrast. Evaluation is also limited due to streak artifact caused by spinal hardware. Lower chest: Bibasilar linear atelectasis/scarring. The visualized lung bases are otherwise clear. No intra-abdominal free air or free fluid. Hepatobiliary: Several small liver cysts. No intrahepatic biliary ductal dilatation. The gallbladder is unremarkable. Pancreas: Unremarkable. No pancreatic ductal dilatation or surrounding inflammatory changes. Spleen: Normal in size without focal abnormality. Adrenals/Urinary Tract: The adrenal glands are unremarkable. There is no hydronephrosis or nephrolithiasis on either side. The visualized ureters appear unremarkable. The urinary bladder is distended and grossly unremarkable. Stomach/Bowel: Large duodenal diverticula measure up to 5 cm. No definite inflammatory changes. There is moderate stool throughout the  colon. There is sigmoid diverticulosis without active inflammatory changes. There is no bowel obstruction. Appendectomy. Vascular/Lymphatic: Mild aortoiliac atherosclerotic disease. The IVC is unremarkable. No portal venous gas. There is no adenopathy. Reproductive: Hysterectomy. No adnexal masses. Other: None Musculoskeletal: Osteopenia with scoliosis and degenerative changes of the spine. Extensive lumbar disc spacers and posterior fusion hardware. The hardware appears intact. There is no acute fracture. Total left hip arthroplasty. IMPRESSION: 1. No acute intra-abdominal or pelvic pathology. 2. Moderate colonic stool burden. No bowel obstruction. 3. Duodenal and sigmoid diverticulosis. 4. Aortic Atherosclerosis (ICD10-I70.0). Electronically Signed   By: Anner Crete M.D.   On: 04/23/2021 14:59    Assessment/Plan 1. History of fracture of left hip Patient uses a walker.  She is concerned that favoring the leg will lead to back pain/problems.  She has brace  2. History of TIA (transient ischemic attack) Takes only aspirin   3. HTN (hypertension), benign Blood pressure is normal off medicines  4. Hyperlipidemia, unspecified hyperlipidemia type Off a atorvastatin; lipids not recently assessed  5. Hypothyroidism, unspecified type TSH was in therapeutic range on levothyroxine  6. Neuropathy She takes gabapentin for restless leg but likely helps neuropathy  7. Restless leg syndrome On gabapentin which is effective    Family/ staff Communication:   Labs/tests ordered:  .smmsig

## 2022-10-26 ENCOUNTER — Other Ambulatory Visit: Payer: Self-pay | Admitting: Nurse Practitioner

## 2022-10-26 ENCOUNTER — Telehealth: Payer: Medicare Other

## 2022-10-26 MED ORDER — TRAMADOL HCL 50 MG PO TABS: 50.0000 mg | ORAL_TABLET | Freq: Four times a day (QID) | ORAL | 0 refills | Status: DC | PRN

## 2022-10-26 NOTE — Telephone Encounter (Signed)
Below is ManX's reply:  Mast, Man X, NP  You39 minutes ago (11:15 AM)    Order sent, thank you

## 2022-12-08 ENCOUNTER — Non-Acute Institutional Stay: Payer: Medicare Other | Admitting: Nurse Practitioner

## 2022-12-08 ENCOUNTER — Encounter: Payer: Self-pay | Admitting: Nurse Practitioner

## 2022-12-08 DIAGNOSIS — E039 Hypothyroidism, unspecified: Secondary | ICD-10-CM

## 2022-12-08 DIAGNOSIS — Z8673 Personal history of transient ischemic attack (TIA), and cerebral infarction without residual deficits: Secondary | ICD-10-CM

## 2022-12-08 DIAGNOSIS — F419 Anxiety disorder, unspecified: Secondary | ICD-10-CM

## 2022-12-08 DIAGNOSIS — K219 Gastro-esophageal reflux disease without esophagitis: Secondary | ICD-10-CM | POA: Diagnosis not present

## 2022-12-08 DIAGNOSIS — I872 Venous insufficiency (chronic) (peripheral): Secondary | ICD-10-CM

## 2022-12-08 DIAGNOSIS — K5901 Slow transit constipation: Secondary | ICD-10-CM

## 2022-12-08 DIAGNOSIS — G2581 Restless legs syndrome: Secondary | ICD-10-CM

## 2022-12-08 DIAGNOSIS — E871 Hypo-osmolality and hyponatremia: Secondary | ICD-10-CM

## 2022-12-08 DIAGNOSIS — F5105 Insomnia due to other mental disorder: Secondary | ICD-10-CM

## 2022-12-08 DIAGNOSIS — L219 Seborrheic dermatitis, unspecified: Secondary | ICD-10-CM

## 2022-12-08 DIAGNOSIS — R569 Unspecified convulsions: Secondary | ICD-10-CM

## 2022-12-08 DIAGNOSIS — M8000XS Age-related osteoporosis with current pathological fracture, unspecified site, sequela: Secondary | ICD-10-CM

## 2022-12-08 DIAGNOSIS — M159 Polyosteoarthritis, unspecified: Secondary | ICD-10-CM

## 2022-12-08 DIAGNOSIS — M150 Primary generalized (osteo)arthritis: Secondary | ICD-10-CM

## 2022-12-08 NOTE — Assessment & Plan Note (Signed)
minimal swelling,  takes Furosemide, 06/20/20 EF 60-65%, Bun/creat 19/0.7 07/02/22

## 2022-12-08 NOTE — Assessment & Plan Note (Signed)
Stable, Hgb 12.2 07/02/22

## 2022-12-08 NOTE — Assessment & Plan Note (Signed)
not sleeping well, prn Lorazepam, failed Mirtazapine, desires no Ambien

## 2022-12-08 NOTE — Assessment & Plan Note (Signed)
Stable, takes MiraLax, Senokot S

## 2022-12-08 NOTE — Assessment & Plan Note (Signed)
left hip, s/p total left hip, multiple sites, takes Tylenol, Aleve, Gabapentin for pain in toes, Tramadol.  failed Methocarbamol recommended by Ortho-felt wobbly.

## 2022-12-08 NOTE — Assessment & Plan Note (Addendum)
takes Levothyroxine, TSH 1.30 8/323, the patient desires to take higher dose Levothyroxine to help with energy, will chest TSH, Free T3/T4, also reduce Gabapentin.  12/22/22 TSH 2.09, the patient wants to try higher dose of Levothyroxine to see if she has more energy, increase Levothyroxine to 88/23mg, repeat TSH 8 wks, also start Vit B12 10033m qd, update Vit B12 level in 3 months.

## 2022-12-08 NOTE — Assessment & Plan Note (Signed)
saw neurology. Hospitalized 8/12-8/19 for seizure with presentation of left sided weakness again. EEG showed focal seizure right frontal region. Takes Keppra.

## 2022-12-08 NOTE — Progress Notes (Addendum)
Location:  [facility] Room Number: AL/900/A Place of Service:  ALF (13) Provider:  Afiya Ferrebee X, NP  Patient Care Team: [name], MD as PCP - General (Internal Medicine)  Extended Emergency Contact Information Primary Emergency Contact: Milon Dikes [place] Phone: [phone] Mobile Phone: [phone] Relation: Friend Secondary Emergency Contact: Rodman Key Address: [address]          [place] [address] Johnnette Litter of [place] Phone: [phone] Mobile Phone: [phone] Relation: Son  Code Status:  DNR Goals of care: Advanced Directive information    12/11/2022    2:50 PM  Advanced Directives  Does Patient Have a Medical Advance Directive? Yes  Type of Advance Directive Out of facility DNR (pink MOST or yellow form)  Does patient want to make changes to medical advance directive? No - Patient declined     Chief Complaint  Patient presents with   Medical Management of Chronic Issues    Patient is here for a follow up for chronic conditions    Quality Metric Gaps    Patient is due for annual wellness visit    HPI:  Pt is a 89 y.o. female seen today for medical management of chronic diseases.      Edema BLE, minimal swelling,  takes Furosemide, 06/20/20 EF 60-65%, Bun/creat 19/0.7 07/02/22             RLS takes Gabapentin qod to avoid possible AR,  off Requip.             Seizure, saw neurology. Hospitalized 8/12-8/19 for seizure with presentation of left sided weakness again. EEG showed focal seizure right frontal region. Takes Keppra.             Hypothyroidism, takes Levothyroxine, TSH 1.30 8/323             Hyponatremia, stable, Na 132 07/02/22             Anxiety, not sleeping well, prn Lorazepam, failed Mirtazapine, desires no Ambien             GERD, stable, Hgb 12.2 07/02/22             Constipation, takes MiraLax, Senokot S             OA, left hip, s/p total left hip,  multiple sites, takes Tylenol, Aleve, Gabapentin for pain in toes(failed GDR to qod), Tramadol.  failed Methocarbamol recommended by Ortho-felt wobbly.              OP Boniva, Ca, Vit D, declined DEXA, Vit D 16 10/07/21.              TIA, off  Atorvastatin per patient's request, on ASA       Past Medical History:  Diagnosis Date   Abdominal pain 02/26/2017   Anxiety    Arthritis    Left Knee, Wrist, Back and Hips   Cervical vertebral fusion    Diverticulitis    Diverticulosis    Fibromyalgia    GERD (gastroesophageal reflux disease)    Gout 02/2018   L hand   Hypertension     after d/c from hospital no current problems or medication   Hypothyroidism    Pelvis fracture ([place])    Peritoneal free air 03/29/2012   Pneumonia    Pneumoperitoneum 02/26/2017   Pre-diabetes    Seizure ([place])  Thyroid disorder    Toe pain 09/06/2016   Past Surgical History:  Procedure Laterality Date   ABDOMINAL HYSTERECTOMY     BOWEL RESECTION N/A 07/09/2017   Procedure: SMALL BOWEL RESECTION;  Surgeon: Johnathan Hausen, MD;  Location: [facility];  Service: General;  Laterality: N/A;   CERVICAL FUSION     x2   CONVERSION TO TOTAL HIP Left 10/31/2020   Procedure: CONVERSION TO ANTERIOR TOTAL HIP;  Surgeon: Paralee Cancel, MD;  Location: [facility];  Service: Orthopedics;  Laterality: Left;  2 hrs   FEMUR IM NAIL Left 03/03/2020   Procedure: INTRAMEDULLARY (IM) NAIL FEMORAL;  Surgeon: Paralee Cancel, MD;  Location: [facility];  Service: Orthopedics;  Laterality: Left;   LAPAROSCOPIC APPENDECTOMY N/A 03/04/2017   Procedure: APPENDECTOMY LAPAROSCOPIC;  Surgeon: Johnathan Hausen, MD;  Location: [facility];  Service: General;  Laterality: N/A;   LAPAROSCOPY N/A 03/04/2017   Procedure: LAPAROSCOPY DIAGNOSTIC, ENTEROLYSIS;  Surgeon: Johnathan Hausen, MD;  Location: [facility];  Service: General;  Laterality: N/A;   LAPAROTOMY N/A 07/09/2017   Procedure: EXPLORATORY LAPAROTOMY;  Surgeon: Johnathan Hausen, MD;  Location: [facility];  Service:  General;  Laterality: N/A;   SPINE SURGERY     Lumbar- rod placement   TONSILLECTOMY     89y/o   TOTAL VAGINAL HYSTERECTOMY      Allergies  Allergen Reactions   Codeine Rash   Penicillins Rash    Has patient had a PCN reaction causing immediate rash, facial/tongue/throat swelling, SOB or lightheadedness with hypotension: No Has patient had a PCN reaction causing severe rash involving mucus membranes or skin necrosis: No Has patient had a PCN reaction that required hospitalization: No Has patient had a PCN reaction occurring within the last 10 years: No If all of the above answers are "NO", then may proceed with Cephalosporin use.  Tolerated Cephalosporin 10/31/20     Levaquin [Levofloxacin]     Outpatient Encounter Medications as of 12/08/2022  Medication Sig   acetaminophen (TYLENOL) 500 MG tablet Take 500 mg by mouth 2 (two) times daily as needed.   aspirin EC 81 MG tablet Take 81 mg by mouth daily. Swallow whole.   Calcium Carbonate (CALCIUM 500 PO) Take 500 mg by mouth daily.    furosemide (LASIX) 20 MG tablet Take 20 mg by mouth daily.   gabapentin (NEURONTIN) 100 MG capsule Take 1 capsule (100 mg total) by mouth at bedtime.   ibandronate (BONIVA) 150 MG tablet Take 150 mg by mouth every 30 (thirty) days. Take in the morning with a full glass of water, on an empty stomach, and do not take anything else by mouth or lie down for the next 30 min.   levETIRAcetam (KEPPRA XR) 500 MG 24 hr tablet Take 1 tablet (500 mg total) by mouth daily.   levothyroxine (SYNTHROID) 75 MCG tablet Take 1 tablet (75 mcg total) by mouth daily before breakfast.   LORazepam (ATIVAN) 0.5 MG tablet Take 0.5 tablets (0.25 mg total) by mouth 2 (two) times daily as needed for anxiety.   mupirocin ointment (BACTROBAN) 2 % Apply 1 application. topically 2 (two) times daily as needed.   naproxen sodium (ALEVE) 220 MG tablet Take 220 mg by mouth 2 (two) times daily as needed.   Olopatadine HCl 0.7 % SOLN Apply  to eye daily as needed (One drop to each eye).   phenol (CHLORASEPTIC) 1.4 % LIQD Use as directed 1 spray in the mouth or throat every 4 (four) hours as needed for throat  irritation / pain.   polyethylene glycol (MIRALAX / GLYCOLAX) 17 g packet Take 17 g by mouth daily.    sennosides-docusate sodium (SENOKOT-S) 8.6-50 MG tablet Take 1 tablet by mouth at bedtime as needed for constipation.   traMADol (ULTRAM) 50 MG tablet Take 1 tablet (50 mg total) by mouth every 6 (six) hours as needed.   Vitamin D, Cholecalciferol, 50 MCG (2000 UT) CAPS Take 2,000 Units by mouth daily.    zolpidem (AMBIEN) 5 MG tablet Take 5 mg by mouth at bedtime as needed for sleep.   No facility-administered encounter medications on file as of 12/08/2022.    Review of Systems  Constitutional:  Negative for fatigue, fever and unexpected weight change.  HENT:  Positive for hearing loss. Negative for congestion, rhinorrhea and trouble swallowing.   Eyes:  Negative for redness and visual disturbance.  Respiratory:  Positive for shortness of breath. Negative for cough and wheezing.        DOE  Cardiovascular:  Positive for leg swelling.  Gastrointestinal:  Negative for abdominal pain and constipation.  Genitourinary:  Negative for dysuria and urgency.       Bathroom trips 1-2/night  Musculoskeletal:  Positive for arthralgias, back pain and gait problem.       Left hip pain, s/p total left hip arthroplasty. Multiple sites aches in am, better as day goes by. Pain in toes.   Skin:  Negative for color change.       Itching scaly scalp  Neurological:  Negative for seizures, weakness and headaches.       Restless legs at night.   Psychiatric/Behavioral:  Positive for sleep disturbance. Negative for behavioral problems. The patient is nervous/anxious.     Immunization History  Administered Date(s) Administered   Influenza Split 08/11/2018, 08/03/2019   Influenza-Unspecified 09/01/2018, 09/10/2020, 09/18/2021, 09/21/2022    Moderna Sars-Covid-2 Vaccination 12/04/2019, 01/01/2020   Pfizer Covid-19 Vaccine Bivalent Booster 34yr & up 10/01/2022   Pneumococcal Conjugate-13 05/29/2014   Pneumococcal Polysaccharide-23 06/14/2015   Tdap 09/14/2012   Zoster Recombinat (Shingrix) 02/10/2022, 05/11/2022   Zoster, Live 04/30/2004, 04/13/2018, 07/05/2018   Pertinent  Health Maintenance Due  Topic Date Due   DEXA SCAN  01/30/2023 (Originally 10/25/1997)   INFLUENZA VACCINE  Completed      11/03/2020    9:15 PM 11/04/2020   12:00 PM 04/23/2021   11:34 AM 12/08/2022    9:52 AM 12/11/2022    2:50 PM  Fall Risk  Falls in the past year?    0 0  Was there an injury with Fall?    0 0  Fall Risk Category Calculator    0 0  Fall Risk Category (Retired)    Low Low  (RETIRED) Patient Fall Risk Level High fall risk High fall risk Moderate fall risk Low fall risk Low fall risk  Patient at Risk for Falls Due to    No Fall Risks No Fall Risks  Fall risk Follow up    Falls evaluation completed Falls evaluation completed   Functional Status Survey:    Vitals:   12/08/22 0953  BP: 122/70  Pulse: 92  Resp: 18  Temp: 98.1 F (36.7 C)  SpO2: 99%  Weight: 100 lb 9.6 oz (45.6 kg)  Height: 5' (1.524 m)   Body mass index is 19.65 kg/m. Physical Exam Constitutional:      Appearance: Normal appearance.  HENT:     Head: Normocephalic and atraumatic.     Nose: Nose normal.  Mouth/Throat:     Mouth: Mucous membranes are moist.  Eyes:     Extraocular Movements: Extraocular movements intact.     Pupils: Pupils are equal, round, and reactive to light.  Cardiovascular:     Rate and Rhythm: Normal rate and regular rhythm.     Heart sounds: No murmur heard.    Comments: Weak left DP pulse, more symptomatic tingling sensation at night.  Pulmonary:     Effort: Pulmonary effort is normal.     Breath sounds: No rales.  Abdominal:     General: Bowel sounds are normal.     Palpations: Abdomen is soft.     Tenderness: There is  no abdominal tenderness.  Musculoskeletal:        General: Tenderness present.     Cervical back: Normal range of motion and neck supple.     Right lower leg: Edema present.     Left lower leg: Edema present.     Comments: mild scoliosis. Hx of back surgeries x3 with left sciatica pain. s/p total left hip replacement. Trace edema BLE  Skin:    General: Skin is warm and dry.     Comments: Itching scaly scalp  Neurological:     General: No focal deficit present.     Mental Status: She is alert and oriented to person, place, and time. Mental status is at baseline.     Gait: Gait abnormal.     Comments: Using walker sometimes.   Psychiatric:        Mood and Affect: Mood normal.        Behavior: Behavior normal.        Thought Content: Thought content normal.        Judgment: Judgment normal.     Labs reviewed: Recent Labs    02/20/22 0000 05/21/22 0000 06/09/22 0000 07/02/22 0000  NA 133* 135* 134* 132*  K 4.5 4.6 4.4 4.6  CL 102 102 98* 99  CO2 24* 25* 26* 26*  BUN 15  --  16 19  CREATININE 0.7 0.7 0.8 0.7  CALCIUM 9.0 9.6 9.9 9.1   Recent Labs    02/06/22 0000 02/20/22 0000 07/02/22 0000  AST '20 18 19  '$ ALT '12 9 12  '$ ALKPHOS 44 40 43  ALBUMIN 3.9 3.7 3.8   Recent Labs    06/09/22 0000 07/02/22 0000  WBC 6.9 5.0  NEUTROABS 4,195.00 2,550.00  HGB 13.7 12.2  HCT 40 35*  PLT 384 420*   Lab Results  Component Value Date   TSH 1.30 07/02/2022   Lab Results  Component Value Date   HGBA1C 5.8 (H) 10/21/2020   Lab Results  Component Value Date   CHOL 171 06/20/2020   HDL 59 06/20/2020   LDLCALC 94 06/20/2020   TRIG 89 06/20/2020   CHOLHDL 2.9 06/20/2020    Significant Diagnostic Results in last 30 days:  No results found.  Assessment/Plan Seborrheic dermatitis of scalp Itching scaly scalp, failure shampoo, will try 2% Ketoconazole foam bid x 4 weeks.   Hypothyroidism takes Levothyroxine, TSH 1.30 8/323, the patient desires to take higher dose  Levothyroxine to help with energy, will chest TSH, Free T3/T4, also reduce Gabapentin.  12/22/22 TSH 2.09, the patient wants to try higher dose of Levothyroxine to see if she has more energy, increase Levothyroxine to 88/38mg, repeat TSH 8 wks, also start Vit B12 10041m qd, update Vit B12 level in 3 months.   Hyponatremia stable, Na 132 07/02/22  Insomnia  secondary to anxiety not sleeping well, prn Lorazepam, failed Mirtazapine, desires no Ambien  GERD (gastroesophageal reflux disease) Stable, Hgb 12.2 07/02/22  Slow transit constipation Stable, takes MiraLax, Senokot S  Osteoarthritis, multiple sites left hip, s/p total left hip, multiple sites, takes Tylenol, Aleve, Gabapentin for pain in toes, Tramadol.  failed Methocarbamol recommended by Ortho-felt wobbly.   Osteoporosis Boniva, Ca, Vit D, declined DEXA, Vit D 16 10/07/21.   History of TIA (transient ischemic attack) off  Atorvastatin per patient's request, on ASA   Seizures ([place]) saw neurology. Hospitalized 8/12-8/19 for seizure with presentation of left sided weakness again. EEG showed focal seizure right frontal region. Takes Keppra.  Restless leg syndrome takes Gabapentin qod to avoid possible AR,  off Requip.  Venous insufficiency minimal swelling,  takes Furosemide, 06/20/20 EF 60-65%, Bun/creat 19/0.7 07/02/22     Family/ staff Communication: plan of care reviewed with the patient and charge nurse.   Labs/tests ordered: TSH FreeT3/T4, CBC/diff, CMP/eGFR  Time spend 40 minutes.

## 2022-12-08 NOTE — Assessment & Plan Note (Signed)
takes Gabapentin qod to avoid possible AR,  off Requip.

## 2022-12-08 NOTE — Assessment & Plan Note (Signed)
stable, Na 132 07/02/22

## 2022-12-08 NOTE — Assessment & Plan Note (Signed)
Boniva, Ca, Vit D, declined DEXA, Vit D 16 10/07/21.

## 2022-12-08 NOTE — Assessment & Plan Note (Signed)
Itching scaly scalp, failure shampoo, will try 2% Ketoconazole foam bid x 4 weeks.

## 2022-12-08 NOTE — Assessment & Plan Note (Signed)
off  Atorvastatin per patient's request, on ASA

## 2022-12-10 LAB — BASIC METABOLIC PANEL: BUN: 18 — AB (ref 4–21)

## 2022-12-10 LAB — CBC AND DIFFERENTIAL
HCT: 34 — AB (ref 36–46)
Hemoglobin: 11.7 — AB (ref 12.0–16.0)
Neutrophils Absolute: 2875
Platelets: 394 10*3/uL (ref 150–400)
WBC: 5

## 2022-12-10 LAB — HEPATIC FUNCTION PANEL
ALT: 9 U/L (ref 7–35)
AST: 15 (ref 13–35)
Alkaline Phosphatase: 43 (ref 25–125)
Bilirubin, Total: 0.6

## 2022-12-10 LAB — BASIC METABOLIC PANEL WITH GFR
CO2: 25 — AB (ref 13–22)
Chloride: 99 (ref 99–108)
Creatinine: 0.7 (ref 0.5–1.1)
Glucose: 84
Potassium: 4.6 meq/L (ref 3.5–5.1)
Sodium: 132 — AB (ref 137–147)

## 2022-12-10 LAB — CBC: RBC: 3.52 — AB (ref 3.87–5.11)

## 2022-12-10 LAB — COMPREHENSIVE METABOLIC PANEL WITH GFR
Albumin: 3.9 (ref 3.5–5.0)
Calcium: 9.4 (ref 8.7–10.7)
Globulin: 2.1

## 2022-12-11 ENCOUNTER — Non-Acute Institutional Stay: Payer: Medicare Other | Admitting: Nurse Practitioner

## 2022-12-11 ENCOUNTER — Encounter: Payer: Self-pay | Admitting: Nurse Practitioner

## 2022-12-11 DIAGNOSIS — Z0000 Encounter for general adult medical examination without abnormal findings: Secondary | ICD-10-CM

## 2022-12-14 ENCOUNTER — Encounter: Payer: Self-pay | Admitting: Nurse Practitioner

## 2022-12-14 NOTE — Progress Notes (Signed)
Subjective:   Sara Davila is a 89 y.o. female who presents for Medicare Annual (Subsequent) preventive examination @ [facility].  Cardiac Risk Factors include: advanced age (>70mn, >>49women);dyslipidemia     Objective:    Today's Vitals   12/11/22 1447 12/14/22 1700  BP: 122/70   Pulse: 92   Resp: 18   Temp: 98.1 F (36.7 C)   SpO2: 99%   Weight: 100 lb 9.6 oz (45.6 kg)   Height: 5' (1.524 m)   PainSc:  2    Body mass index is 19.65 kg/m.     12/11/2022    2:50 PM 12/08/2022    9:52 AM 06/29/2022    2:42 PM 05/25/2022   10:41 AM 05/18/2022    9:54 AM 03/24/2022   11:51 AM 01/29/2022   10:54 AM  Advanced Directives  Does Patient Have a Medical Advance Directive? Yes Yes Yes Yes Yes Yes Yes  Type of Advance Directive Out of facility DNR (pink MOST or yellow form) Out of facility DNR (pink MOST or yellow form) Living will;Out of facility DNR (pink MOST or yellow form) Living will;Out of facility DNR (pink MOST or yellow form) Living will;Out of facility DNR (pink MOST or yellow form) Living will;Out of facility DNR (pink MOST or yellow form) Out of facility DNR (pink MOST or yellow form)  Does patient want to make changes to medical advance directive? No - Patient declined No - Patient declined No - Patient declined No - Patient declined No - Patient declined No - Patient declined No - Patient declined  Pre-existing out of facility DNR order (yellow form or pink MOST form)   Yellow form placed in chart (order not valid for inpatient use);Pink MOST form placed in chart (order not valid for inpatient use) Yellow form placed in chart (order not valid for inpatient use) Yellow form placed in chart (order not valid for inpatient use) Yellow form placed in chart (order not valid for inpatient use) Pink MOST form placed in chart (order not valid for inpatient use)    Current Medications (verified) Outpatient Encounter Medications as of 12/11/2022  Medication Sig    acetaminophen (TYLENOL) 500 MG tablet Take 500 mg by mouth 2 (two) times daily as needed.   aspirin EC 81 MG tablet Take 81 mg by mouth daily. Swallow whole.   Calcium Carbonate (CALCIUM 500 PO) Take 500 mg by mouth daily.    furosemide (LASIX) 20 MG tablet Take 20 mg by mouth daily.   gabapentin (NEURONTIN) 100 MG capsule Take 1 capsule (100 mg total) by mouth at bedtime.   ibandronate (BONIVA) 150 MG tablet Take 150 mg by mouth every 30 (thirty) days. Take in the morning with a full glass of water, on an empty stomach, and do not take anything else by mouth or lie down for the next 30 min.   levETIRAcetam (KEPPRA XR) 500 MG 24 hr tablet Take 1 tablet (500 mg total) by mouth daily.   levothyroxine (SYNTHROID) 75 MCG tablet Take 1 tablet (75 mcg total) by mouth daily before breakfast.   LORazepam (ATIVAN) 0.5 MG tablet Take 0.5 tablets (0.25 mg total) by mouth 2 (two) times daily as needed for anxiety.   mupirocin ointment (BACTROBAN) 2 % Apply 1 application. topically 2 (two) times daily as needed.   naproxen sodium (ALEVE) 220 MG tablet Take 220 mg by mouth 2 (two) times daily as needed.   Olopatadine HCl 0.7 % SOLN Apply to  eye daily as needed (One drop to each eye).   phenol (CHLORASEPTIC) 1.4 % LIQD Use as directed 1 spray in the mouth or throat every 4 (four) hours as needed for throat irritation / pain.   polyethylene glycol (MIRALAX / GLYCOLAX) 17 g packet Take 17 g by mouth daily.    sennosides-docusate sodium (SENOKOT-S) 8.6-50 MG tablet Take 1 tablet by mouth at bedtime as needed for constipation.   traMADol (ULTRAM) 50 MG tablet Take 1 tablet (50 mg total) by mouth every 6 (six) hours as needed.   Vitamin D, Cholecalciferol, 50 MCG (2000 UT) CAPS Take 2,000 Units by mouth daily.    zolpidem (AMBIEN) 5 MG tablet Take 5 mg by mouth at bedtime as needed for sleep.   No facility-administered encounter medications on file as of 12/11/2022.    Allergies (verified) Codeine, Penicillins,  and Levaquin [levofloxacin]   History: Past Medical History:  Diagnosis Date   Abdominal pain 02/26/2017   Anxiety    Arthritis    Left Knee, Wrist, Back and Hips   Cervical vertebral fusion    Diverticulitis    Diverticulosis    Fibromyalgia    GERD (gastroesophageal reflux disease)    Gout 02/2018   L hand   Hypertension     after d/c from hospital no current problems or medication   Hypothyroidism    Pelvis fracture ([place])    Peritoneal free air 03/29/2012   Pneumonia    Pneumoperitoneum 02/26/2017   Pre-diabetes    Seizure ([place])    Thyroid disorder    Toe pain 09/06/2016   Past Surgical History:  Procedure Laterality Date   ABDOMINAL HYSTERECTOMY     BOWEL RESECTION N/A 07/09/2017   Procedure: SMALL BOWEL RESECTION;  Surgeon: Johnathan Hausen, MD;  Location: [facility];  Service: General;  Laterality: N/A;   CERVICAL FUSION     x2   CONVERSION TO TOTAL HIP Left 10/31/2020   Procedure: CONVERSION TO ANTERIOR TOTAL HIP;  Surgeon: Paralee Cancel, MD;  Location: [facility];  Service: Orthopedics;  Laterality: Left;  2 hrs   FEMUR IM NAIL Left 03/03/2020   Procedure: INTRAMEDULLARY (IM) NAIL FEMORAL;  Surgeon: Paralee Cancel, MD;  Location: [facility];  Service: Orthopedics;  Laterality: Left;   LAPAROSCOPIC APPENDECTOMY N/A 03/04/2017   Procedure: APPENDECTOMY LAPAROSCOPIC;  Surgeon: Johnathan Hausen, MD;  Location: [facility];  Service: General;  Laterality: N/A;   LAPAROSCOPY N/A 03/04/2017   Procedure: LAPAROSCOPY DIAGNOSTIC, ENTEROLYSIS;  Surgeon: Johnathan Hausen, MD;  Location: [facility];  Service: General;  Laterality: N/A;   LAPAROTOMY N/A 07/09/2017   Procedure: EXPLORATORY LAPAROTOMY;  Surgeon: Johnathan Hausen, MD;  Location: [facility];  Service: General;  Laterality: N/A;   SPINE SURGERY     Lumbar- rod placement   TONSILLECTOMY     89y/o   TOTAL VAGINAL HYSTERECTOMY     Family History  Problem Relation Age of Onset   Asthma Mother    Pulmonary embolism Mother    Macular degeneration Mother     Heart disease Father    Stroke Father    Stroke Paternal Uncle    Breast cancer Maternal Aunt    Macular degeneration Sister    Stroke Sister    Neuropathy Neg Hx    Social History   Socioeconomic History   Marital status: Widowed    Spouse name: Not on file   Number of children: 2   Years of education: RN   Highest education level: Not on file  Occupational History   Occupation: Retired   Tobacco Use   Smoking status: Former   Smokeless tobacco: Never   Tobacco comments:    Smoked from age [age]  Vaping Use   Vaping Use: Never used  Substance and Sexual Activity   Alcohol use: Not Currently    Comment: occasional glass of wine   Drug use: No   Sexual activity: Not Currently  Other Topics Concern   Not on file  Social History Narrative   Lives at [facility] independent living    Caffeine use: occasional cup of coffee, 2-3 times a week   Left handed         Diet: Regular      Do you drink/ eat things with caffeine? No      Marital status: Widowed                              What year were you married ? 1986      Do you live in a house, apartment,assistred living, condo, trailer, etc.)? [place]       Is it one or more stories? 4 th floor      How many persons live in your home ? 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  Yes                            Type & how often 5-6 x WK      ADVANCED DIRECTIVES (Please bring copies)      Do you have a living will? Yes      Do you have a DNR form?   Yes                    If not, do you want to discuss one?       Do you have signed POA?HPOA forms?   Yes              If so, please bring to your appointment      FUNCTIONAL STATUS- To be completed by Spouse / child / Staff       Do you have difficulty bathing or dressing yourself ? No      Do you  have difficulty preparing food or eating ?  No      Do you have difficulty managing your mediation ?No      Do you have difficulty managing your finances ? No      Do you have difficulty affording your medication ? No      Social Determinants of Radio broadcast assistant Strain: Not on file  Food Insecurity: Not on file  Transportation Needs: Not on file  Physical Activity: Not on file  Stress: Not on file  Social Connections: Not on file    Tobacco Counseling Counseling given: Not Answered Tobacco comments: Smoked from age [age]   Clinical Intake:  Pre-visit preparation completed: Yes  Pain : 0-10 Pain Score: 2  Pain Type: Chronic pain, Neuropathic pain Pain Location: Foot Pain Orientation: Right, Left Pain Descriptors / Indicators: Burning, Nagging, Numbness, Tingling Pain Onset: More than a month  ago Pain Frequency: Several days a week Pain Relieving Factors: Gabapentin Effect of Pain on Daily Activities: not sleep well  Pain Relieving Factors: Gabapentin  BMI - recorded: 19.65 Nutritional Status: BMI of 19-24  Normal Nutritional Risks: None Diabetes: No  How often do you need to have someone help you when you read instructions, pamphlets, or other written materials from your doctor or pharmacy?: 1 - Never What is the last grade level you completed in school?: college  Diabetic?no  Interpreter Needed?: No  Information entered by :: Isaly Fasching Bretta Bang NP   Activities of Daily Living    12/14/2022    5:05 PM  In your present state of health, do you have any difficulty performing the following activities:  Hearing? 0  Vision? 0  Difficulty concentrating or making decisions? 0  Walking or climbing stairs? 1  Dressing or bathing? 1  Doing errands, shopping? 1  Preparing Food and eating ? N  Using the Toilet? N  In the past six months, have you accidently leaked urine? Y  Do you have problems with loss of bowel control? N  Managing your Medications? Y   Managing your Finances? Y  Housekeeping or managing your Housekeeping? Y    Patient Care Team: [name], MD as PCP - General (Internal Medicine)  Indicate any recent Medical Services you may have received from other than [facility] providers in the past year (date may be approximate).     Assessment:   This is a routine wellness examination for L[place]ette.  Hearing/Vision screen No results found.  Dietary issues and exercise activities discussed: Current Exercise Habits: The patient does not participate in regular exercise at present, Exercise limited by: neurologic condition(s);orthopedic condition(s)   Goals Addressed             This Visit's Progress    Maintain Mobility and Function       Evidence-based guidance:  Emphasize the importance of physical activity and aerobic exercise as included in treatment plan; assess barriers to adherence; consider patient's abilities and preferences.  Encourage gradual increase in activity or exercise instead of stopping if pain occurs.  Reinforce individual therapy exercise prescription, such as strengthening, stabilization and stretching programs.  Promote optimal body mechanics to stabilize the spine with lifting and functional activity.  Encourage activity and mobility modifications to facilitate optimal function, such as using a log roll for bed mobility or dressing from a seated position.  Reinforce individual adaptive equipment recommendations to limit excessive spinal movements, such as a Systems analyst.  Assess adequacy of sleep; encourage use of sleep hygiene techniques, such as bedtime routine; use of white noise; dark, cool bedroom; avoiding daytime naps, heavy meals or exercise before bedtime.  Promote positions and modification to optimize sleep and sexual activity; consider pillows or positioning devices to assist in maintaining neutral spine.  Explore options for applying ergonomic principles at work and home, such as  frequent position changes, using ergonomically designed equipment and working at optimal height.  Promote modifications to increase comfort with driving such as lumbar support, optimizing seat and steering wheel position, using cruise control and taking frequent rest stops to stretch and walk.   Notes:        Depression Screen     No data to display          Fall Risk    12/11/2022    2:50 PM 12/08/2022    9:52 AM 06/14/2020    9:06 AM 04/26/2020   10:08  AM  Fall Risk   Falls in the past year? 0 0 0 1  Number falls in past yr: 0 0 0 0  Injury with Fall? 0 0  1  Risk for fall due to : No Fall Risks No Fall Risks    Follow up Falls evaluation completed Falls evaluation completed      [place]:  Any stairs in or around the home? Yes  If so, are there any without handrails? No  Home free of loose throw rugs in walkways, pet beds, electrical cords, etc? Yes  Adequate lighting in your home to reduce risk of falls? Yes   ASSISTIVE DEVICES UTILIZED TO PREVENT FALLS:  Life alert? No  Use of a cane, walker or w/c? Yes  Grab bars in the bathroom? Yes  Shower chair or bench in shower? Yes  Elevated toilet seat or a handicapped toilet? Yes   TIMED UP AND GO:  Was the test performed? Yes .  Length of time to ambulate 10 feet: 10 sec.   Gait steady and fast with assistive device  Cognitive Function:    11/20/2021   10:03 AM 12/01/2017    9:00 AM  MMSE - Mini Mental State Exam  Orientation to time 5 5  Orientation to Place 5 5  Registration 3 3  Attention/ Calculation 5 5  Recall 3 3  Language- name 2 objects 2 2  Language- repeat 1 1  Language- follow 3 step command 3 3  Language- read & follow direction 1 1  Write a sentence 1 1  Copy design 1 1  Total score 30 30        Immunizations Immunization History  Administered Date(s) Administered   Influenza Split 08/11/2018, 08/03/2019   Influenza-Unspecified 09/01/2018, 09/10/2020,  09/18/2021, 09/21/2022   Moderna Sars-Covid-2 Vaccination 12/04/2019, 01/01/2020   Pfizer Covid-19 Vaccine Bivalent Booster 17yr & up 10/01/2022   Pneumococcal Conjugate-13 05/29/2014   Pneumococcal Polysaccharide-23 06/14/2015   Tdap 09/14/2012   Zoster Recombinat (Shingrix) 02/10/2022, 05/11/2022   Zoster, Live 04/30/2004, 04/13/2018, 07/05/2018    TDAP status: Due, Education has been provided regarding the importance of this vaccine. Advised may receive this vaccine at local pharmacy or Health Dept. Aware to provide a copy of the vaccination record if obtained from local pharmacy or Health Dept. Verbalized acceptance and understanding.  Flu Vaccine status: Up to date  Pneumococcal vaccine status: Up to date  Covid-19 vaccine status: Information provided on how to obtain vaccines.   Qualifies for Shingles Vaccine? Yes   Zostavax completed Yes   Shingrix Completed?: Yes  Screening Tests Health Maintenance  Topic Date Due   DTaP/Tdap/Td (2 - Td or Tdap) 09/14/2022   COVID-19 Vaccine (4 - 2023-24 season) 11/26/2022   DEXA SCAN  01/30/2023 (Originally 10/25/1997)   Medicare Annual Wellness (AWV)  12/12/2023   Pneumonia Vaccine 89 Years old  Completed   INFLUENZA VACCINE  Completed   Zoster Vaccines- Shingrix  Completed   HPV VACCINES  Aged Out    Health Maintenance  Health Maintenance Due  Topic Date Due   DTaP/Tdap/Td (2 - Td or Tdap) 09/14/2022   COVID-19 Vaccine (4 - 2023-24 season) 11/26/2022    Colorectal cancer screening: No longer required.   Mammogram status: No longer required due to aged out.  Bone Density status: Ordered 12/14/22. Pt provided with contact info and advised to call to schedule appt.  Lung Cancer Screening: (Low Dose CT Chest recommended if Age  55-80 years, 30 pack-year currently smoking OR have quit w/in 15years.) does not qualify.    Additional Screening:  Hepatitis C Screening: does not qualify;   Vision Screening: Recommended annual  ophthalmology exams for early detection of glaucoma and other disorders of the eye. Is the patient up to date with their annual eye exam?  No  Who is the provider or what is the name of the office in which the patient attends annual eye exams? HPOA will provide If pt is not established with a provider, would they like to be referred to a provider to establish care? No .   Dental Screening: Recommended annual dental exams for proper oral hygiene  Community Resource Referral / Chronic Care Management: CRR required this visit?  No   CCM required this visit?  No      Plan:    Due Tdap, Eye Exam, DEXA if HPOA desires.  Pending MMSE I have personally reviewed and noted the following in the patient's chart:   Medical and social history Use of alcohol, tobacco or illicit drugs  Current medications and supplements including opioid prescriptions. Patient is not currently taking opioid prescriptions. Functional ability and status Nutritional status Physical activity Advanced directives List of other physicians Hospitalizations, surgeries, and ER visits in previous 12 months Vitals Screenings to include cognitive, depression, and falls Referrals and appointments  In addition, I have reviewed and discussed with patient certain preventive protocols, quality metrics, and best practice recommendations. A written personalized care plan for preventive services as well as general preventive health recommendations were provided to patient.     Barry Faircloth X Clarice Bonaventure, NP   12/14/2022

## 2023-02-17 ENCOUNTER — Non-Acute Institutional Stay: Payer: Medicare Other | Admitting: Family Medicine

## 2023-02-17 DIAGNOSIS — G629 Polyneuropathy, unspecified: Secondary | ICD-10-CM

## 2023-02-17 DIAGNOSIS — F419 Anxiety disorder, unspecified: Secondary | ICD-10-CM

## 2023-02-17 DIAGNOSIS — I10 Essential (primary) hypertension: Secondary | ICD-10-CM | POA: Diagnosis not present

## 2023-02-17 DIAGNOSIS — E871 Hypo-osmolality and hyponatremia: Secondary | ICD-10-CM | POA: Diagnosis not present

## 2023-02-17 DIAGNOSIS — Z9049 Acquired absence of other specified parts of digestive tract: Secondary | ICD-10-CM

## 2023-02-17 DIAGNOSIS — R569 Unspecified convulsions: Secondary | ICD-10-CM

## 2023-02-17 DIAGNOSIS — E039 Hypothyroidism, unspecified: Secondary | ICD-10-CM

## 2023-02-17 DIAGNOSIS — G2581 Restless legs syndrome: Secondary | ICD-10-CM

## 2023-02-17 DIAGNOSIS — F5105 Insomnia due to other mental disorder: Secondary | ICD-10-CM

## 2023-02-17 NOTE — Progress Notes (Signed)
Provider:  Alain Honey, MD Location:      Place of Service:     PCP: [name], MD Patient Care Team: [name], MD as PCP - General (Internal Medicine)  Extended Emergency Contact Information Primary Emergency Contact: Milon Dikes [place] of [place] Phone: [phone] Mobile Phone: [phone] Relation: Friend Secondary Emergency Contact: Rodman Key Address: [address]          [place] [address] Johnnette Litter of [place] Phone: [phone] Mobile Phone: [phone] Relation: Son  Code Status:  Goals of Care: Advanced Directive information    12/11/2022    2:50 PM  Advanced Directives  Does Patient Have a Medical Advance Directive? Yes  Type of Advance Directive Out of facility DNR (pink MOST or yellow form)  Does patient want to make changes to medical advance directive? No - Patient declined      No chief complaint on file.   HPI: Patient is a 89 y.o. female seen today for medical management of chronic problems including chronic malaise and fatigue, restless leg syndrome, seizure disorder, hypothyroidism, hyponatremia, and chronic abdominal pain likely secondary to extensive diverticular disease. Patient's main complaint today is feeling tired.  She wakes up feeling tired as opposed to tiring as the day goes on.  Sleep is probably not adequate per her history.  She is requesting to go back to her original dose of levothyroxine.  It had been increased several months ago but at that time TSH was in the normal range.  Other labs checked at last visit included electrolytes with sodium of 132.  Looking back over the last year sodiums are generally been below 135; potassiums are normal as are hemoglobin.  She also has some concern about her blood pressure.  She is not on any medicine for blood pressure.  Reviewing her medical record shows only 1 blood pressure recently which was a little high but still not high  enough to treat in general I think her blood pressures are okay. She declines any medicine to help her sleep.  She does have lorazepam ordered and takes as needed  Past Medical History:  Diagnosis Date   Abdominal pain 02/26/2017   Anxiety    Arthritis    Left Knee, Wrist, Back and Hips   Cervical vertebral fusion    Diverticulitis    Diverticulosis    Fibromyalgia    GERD (gastroesophageal reflux disease)    Gout 02/2018   L hand   Hypertension     after d/c from hospital no current problems or medication   Hypothyroidism    Pelvis fracture ([place])    Peritoneal free air 03/29/2012   Pneumonia    Pneumoperitoneum 02/26/2017   Pre-diabetes    Seizure ([place])    Thyroid disorder    Toe pain 09/06/2016   Past Surgical History:  Procedure Laterality Date   ABDOMINAL HYSTERECTOMY     BOWEL RESECTION N/A 07/09/2017   Procedure: SMALL BOWEL RESECTION;  Surgeon: Johnathan Hausen, MD;  Location: [facility];  Service: General;  Laterality: N/A;   CERVICAL FUSION     x2   CONVERSION TO TOTAL HIP Left 10/31/2020   Procedure: CONVERSION TO ANTERIOR TOTAL HIP;  Surgeon: Paralee Cancel, MD;  Location: [facility];  Service: Orthopedics;  Laterality: Left;  2 hrs   FEMUR IM NAIL Left 03/03/2020   Procedure: INTRAMEDULLARY (IM) NAIL FEMORAL;  Surgeon: Paralee Cancel, MD;  Location: [facility];  Service: Orthopedics;  Laterality: Left;   LAPAROSCOPIC APPENDECTOMY N/A 03/04/2017   Procedure: APPENDECTOMY LAPAROSCOPIC;  Surgeon: Johnathan Hausen, MD;  Location: [facility];  Service: General;  Laterality: N/A;   LAPAROSCOPY N/A 03/04/2017   Procedure: LAPAROSCOPY DIAGNOSTIC, ENTEROLYSIS;  Surgeon: Johnathan Hausen, MD;  Location: [facility];  Service: General;  Laterality: N/A;   LAPAROTOMY N/A 07/09/2017   Procedure: EXPLORATORY LAPAROTOMY;  Surgeon: Johnathan Hausen, MD;  Location: [facility];  Service: General;  Laterality: N/A;   SPINE SURGERY     Lumbar- rod placement   TONSILLECTOMY     89y/o   TOTAL VAGINAL HYSTERECTOMY       reports that she has quit smoking. She has never used smokeless tobacco. She reports that she does not currently use alcohol. She reports that she does not use drugs. Social History   Socioeconomic History   Marital status: Widowed    Spouse name: Not on file   Number of children: 2   Years of education: RN   Highest education level: Not on file  Occupational History   Occupation: Retired   Tobacco Use   Smoking status: Former   Smokeless tobacco: Never   Tobacco comments:    Smoked from age [age]  Vaping Use   Vaping Use: Never used  Substance and Sexual Activity   Alcohol use: Not Currently    Comment: occasional glass of wine   Drug use: No   Sexual activity: Not Currently  Other Topics Concern   Not on file  Social History Narrative   Lives at [facility]    Caffeine use: occasional cup of coffee, 2-3 times a week   Left handed         Diet: Regular      Do you drink/ eat things with caffeine? No      Marital status: Widowed                              What year were you married ? 1986      Do you live in a house, apartment,assistred living, condo, trailer, etc.)? [place]       Is it one or more stories? 4 th floor      How many persons live in your home ? 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  Yes                            Type & how often 5-6 x WK      ADVANCED DIRECTIVES (Please bring copies)      Do you have a living will? Yes      Do you have a DNR form?   Yes                    If not, do you want to discuss one?       Do you have signed POA?HPOA forms?   Yes              If so, please bring to your appointment      FUNCTIONAL STATUS- To be completed  by Spouse / child / Staff       Do you have difficulty bathing or dressing yourself ? No      Do you have  difficulty preparing food or eating ?  No      Do you have difficulty managing your mediation ?No      Do you have difficulty managing your finances ? No      Do you have difficulty affording your medication ? No      Social Determinants of Health   Financial Resource Strain: Not on file  Food Insecurity: Not on file  Transportation Needs: Not on file  Physical Activity: Not on file  Stress: Not on file  Social Connections: Not on file  Intimate Partner Violence: Not on file    Functional Status Survey:    Family History  Problem Relation Age of Onset   Asthma Mother    Pulmonary embolism Mother    Macular degeneration Mother    Heart disease Father    Stroke Father    Stroke Paternal Uncle    Breast cancer Maternal Aunt    Macular degeneration Sister    Stroke Sister    Neuropathy Neg Hx     Health Maintenance  Topic Date Due   DEXA SCAN  Never done   DTaP/Tdap/Td (2 - Td or Tdap) 09/14/2022   COVID-19 Vaccine (4 - 2023-24 season) 11/26/2022   Medicare Annual Wellness (AWV)  12/12/2023   Pneumonia Vaccine 39+ Years old  Completed   INFLUENZA VACCINE  Completed   Zoster Vaccines- Shingrix  Completed   HPV VACCINES  Aged Out    Allergies  Allergen Reactions   Codeine Rash   Penicillins Rash    Has patient had a PCN reaction causing immediate rash, facial/tongue/throat swelling, SOB or lightheadedness with hypotension: No Has patient had a PCN reaction causing severe rash involving mucus membranes or skin necrosis: No Has patient had a PCN reaction that required hospitalization: No Has patient had a PCN reaction occurring within the last 10 years: No If all of the above answers are "NO", then may proceed with Cephalosporin use.  Tolerated Cephalosporin 10/31/20     Levaquin [Levofloxacin]     Outpatient Encounter Medications as of 02/17/2023  Medication Sig   acetaminophen (TYLENOL) 500 MG tablet Take 500 mg by mouth 2 (two) times daily as needed.    aspirin EC 81 MG tablet Take 81 mg by mouth daily. Swallow whole.   Calcium Carbonate (CALCIUM 500 PO) Take 500 mg by mouth daily.    furosemide (LASIX) 20 MG tablet Take 20 mg by mouth daily.   gabapentin (NEURONTIN) 100 MG capsule Take 1 capsule (100 mg total) by mouth at bedtime.   ibandronate (BONIVA) 150 MG tablet Take 150 mg by mouth every 30 (thirty) days. Take in the morning with a full glass of water, on an empty stomach, and do not take anything else by mouth or lie down for the next 30 min.   levETIRAcetam (KEPPRA XR) 500 MG 24 hr tablet Take 1 tablet (500 mg total) by mouth daily.   levothyroxine (SYNTHROID) 75 MCG tablet Take 1 tablet (75 mcg total) by mouth daily before breakfast.   LORazepam (ATIVAN) 0.5 MG tablet Take 0.5 tablets (0.25 mg total) by mouth 2 (two) times daily as needed for anxiety.   mupirocin ointment (BACTROBAN) 2 % Apply 1 application. topically 2 (two) times daily as needed.   naproxen sodium (ALEVE) 220 MG tablet Take  220 mg by mouth 2 (two) times daily as needed.   Olopatadine HCl 0.7 % SOLN Apply to eye daily as needed (One drop to each eye).   phenol (CHLORASEPTIC) 1.4 % LIQD Use as directed 1 spray in the mouth or throat every 4 (four) hours as needed for throat irritation / pain.   polyethylene glycol (MIRALAX / GLYCOLAX) 17 g packet Take 17 g by mouth daily.    sennosides-docusate sodium (SENOKOT-S) 8.6-50 MG tablet Take 1 tablet by mouth at bedtime as needed for constipation.   traMADol (ULTRAM) 50 MG tablet Take 1 tablet (50 mg total) by mouth every 6 (six) hours as needed.   Vitamin D, Cholecalciferol, 50 MCG (2000 UT) CAPS Take 2,000 Units by mouth daily.    zolpidem (AMBIEN) 5 MG tablet Take 5 mg by mouth at bedtime as needed for sleep.   No facility-administered encounter medications on file as of 02/17/2023.    Review of Systems  Constitutional:  Positive for fatigue.  HENT: Negative.    Eyes: Negative.   Respiratory: Negative.     Cardiovascular: Negative.   Genitourinary: Negative.   Neurological:  Positive for seizures.  Psychiatric/Behavioral:  Positive for sleep disturbance.   All other systems reviewed and are negative.   There were no vitals filed for this visit. There is no height or weight on file to calculate BMI. Physical Exam Vitals and nursing note reviewed.  Constitutional:      Appearance: Normal appearance.  HENT:     Mouth/Throat:     Mouth: Mucous membranes are moist.     Pharynx: Oropharynx is clear.  Eyes:     Extraocular Movements: Extraocular movements intact.     Pupils: Pupils are equal, round, and reactive to light.  Cardiovascular:     Rate and Rhythm: Normal rate and regular rhythm.  Pulmonary:     Effort: Pulmonary effort is normal.     Breath sounds: Normal breath sounds.  Abdominal:     General: Bowel sounds are normal. There is distension.     Palpations: Abdomen is soft.  Musculoskeletal:        General: No swelling.     Right lower leg: No edema.     Left lower leg: No edema.  Neurological:     General: No focal deficit present.     Mental Status: She is oriented to person, place, and time.  Psychiatric:        Mood and Affect: Mood normal.        Behavior: Behavior normal.        Thought Content: Thought content normal.     Labs reviewed: Basic Metabolic Panel: Recent Labs    02/20/22 0000 05/21/22 0000 06/09/22 0000 07/02/22 0000  NA 133* 135* 134* 132*  K 4.5 4.6 4.4 4.6  CL 102 102 98* 99  CO2 24* 25* 26* 26*  BUN 15  --  16 19  CREATININE 0.7 0.7 0.8 0.7  CALCIUM 9.0 9.6 9.9 9.1   Liver Function Tests: Recent Labs    02/20/22 0000 07/02/22 0000  AST 18 19  ALT 9 12  ALKPHOS 40 43  ALBUMIN 3.7 3.8   No results for input(s): "LIPASE", "AMYLASE" in the last 8760 hours. No results for input(s): "AMMONIA" in the last 8760 hours. CBC: Recent Labs    06/09/22 0000 07/02/22 0000  WBC 6.9 5.0  NEUTROABS 4,195.00 2,550.00  HGB 13.7 12.2   HCT 40 35*  PLT 384 420*  Cardiac Enzymes: No results for input(s): "CKTOTAL", "CKMB", "CKMBINDEX", "TROPONINI" in the last 8760 hours. BNP: Invalid input(s): "POCBNP" Lab Results  Component Value Date   HGBA1C 5.8 (H) 10/21/2020   Lab Results  Component Value Date   TSH 1.30 07/02/2022   Lab Results  Component Value Date   VITAMINB12 760 12/01/2017   Lab Results  Component Value Date   FOLATE 19.8 12/01/2017   No results found for: "IRON", "TIBC", "FERRITIN"  Imaging and Procedures obtained prior to SNF admission: CT ABDOMEN PELVIS WO CONTRAST  Result Date: 04/23/2021 CLINICAL DATA:  89 year old female with abdominal distension and pain. EXAM: CT ABDOMEN AND PELVIS WITHOUT CONTRAST TECHNIQUE: Multidetector CT imaging of the abdomen and pelvis was performed following the standard protocol without IV contrast. COMPARISON:  CT abdomen pelvis dated 02/26/2017. FINDINGS: Evaluation of this exam is limited in the absence of intravenous contrast. Evaluation is also limited due to streak artifact caused by spinal hardware. Lower chest: Bibasilar linear atelectasis/scarring. The visualized lung bases are otherwise clear. No intra-abdominal free air or free fluid. Hepatobiliary: Several small liver cysts. No intrahepatic biliary ductal dilatation. The gallbladder is unremarkable. Pancreas: Unremarkable. No pancreatic ductal dilatation or surrounding inflammatory changes. Spleen: Normal in size without focal abnormality. Adrenals/Urinary Tract: The adrenal glands are unremarkable. There is no hydronephrosis or nephrolithiasis on either side. The visualized ureters appear unremarkable. The urinary bladder is distended and grossly unremarkable. Stomach/Bowel: Large duodenal diverticula measure up to 5 cm. No definite inflammatory changes. There is moderate stool throughout the colon. There is sigmoid diverticulosis without active inflammatory changes. There is no bowel obstruction. Appendectomy.  Vascular/Lymphatic: Mild aortoiliac atherosclerotic disease. The IVC is unremarkable. No portal venous gas. There is no adenopathy. Reproductive: Hysterectomy. No adnexal masses. Other: None Musculoskeletal: Osteopenia with scoliosis and degenerative changes of the spine. Extensive lumbar disc spacers and posterior fusion hardware. The hardware appears intact. There is no acute fracture. Total left hip arthroplasty. IMPRESSION: 1. No acute intra-abdominal or pelvic pathology. 2. Moderate colonic stool burden. No bowel obstruction. 3. Duodenal and sigmoid diverticulosis. 4. Aortic Atherosclerosis (ICD10-I70.0). Electronically Signed   By: Anner Crete M.D.   On: 04/23/2021 14:59    Assessment/Plan 1. HTN (hypertension), benign Blood pressures are really okay there was one value that was around [id number] systolic and diastolic is still below 90.  Continue to check  2. Hyponatremia Probably repeat sodium and electrolytes are appropriate  3. Insomnia secondary to anxiety Declines any addition to help her sleep  4. Hypothyroidism, unspecified type I believe she can resume her former dose of levothyroxine at her request  5. Neuropathy Takes gabapentin for both neuropathy and restless legs  6. Restless leg syndrome On low-dose gabapentin.  7. S/P small bowel resection This done for diverticular disease.  She is still symptomatic but is not a candidate for further surgery  8. Seizures ([place]) Continues with Keppra.  No recent seizures .   Family/ staff Communication:   Labs/tests ordered:  .smmsig

## 2023-02-22 ENCOUNTER — Non-Acute Institutional Stay: Payer: Medicare Other | Admitting: Nurse Practitioner

## 2023-02-22 ENCOUNTER — Encounter: Payer: Self-pay | Admitting: Nurse Practitioner

## 2023-02-22 DIAGNOSIS — F419 Anxiety disorder, unspecified: Secondary | ICD-10-CM

## 2023-02-22 DIAGNOSIS — U071 COVID-19: Secondary | ICD-10-CM | POA: Diagnosis not present

## 2023-02-22 DIAGNOSIS — M159 Polyosteoarthritis, unspecified: Secondary | ICD-10-CM

## 2023-02-22 DIAGNOSIS — M150 Primary generalized (osteo)arthritis: Secondary | ICD-10-CM

## 2023-02-22 DIAGNOSIS — G629 Polyneuropathy, unspecified: Secondary | ICD-10-CM | POA: Diagnosis not present

## 2023-02-22 DIAGNOSIS — I872 Venous insufficiency (chronic) (peripheral): Secondary | ICD-10-CM | POA: Diagnosis not present

## 2023-02-22 DIAGNOSIS — F5105 Insomnia due to other mental disorder: Secondary | ICD-10-CM

## 2023-02-22 DIAGNOSIS — E039 Hypothyroidism, unspecified: Secondary | ICD-10-CM

## 2023-02-22 DIAGNOSIS — R569 Unspecified convulsions: Secondary | ICD-10-CM | POA: Diagnosis not present

## 2023-02-22 DIAGNOSIS — E871 Hypo-osmolality and hyponatremia: Secondary | ICD-10-CM

## 2023-02-22 NOTE — Assessment & Plan Note (Signed)
minimal swelling,  takes Furosemide, 06/20/20 EF 60-65%, Bun/creat 19/0.7 07/02/22 

## 2023-02-22 NOTE — Assessment & Plan Note (Signed)
resumed original dose of Levothyroxine since increased to 88/75mg  12/25/22 per her request since it made no difference in her chronic low energy, TSH 2.09 12/22/22

## 2023-02-22 NOTE — Assessment & Plan Note (Signed)
Chronic Na 130s, update CMP/eGFR in setting of COVID infection.

## 2023-02-22 NOTE — Assessment & Plan Note (Signed)
RLS/peripheral neuropathy, takes Gabapentin qod to avoid possible AR,  off Requip.

## 2023-02-22 NOTE — Assessment & Plan Note (Signed)
not sleeping well, prn Lorazepam, failed Mirtazapine, desires no Ambien 

## 2023-02-22 NOTE — Assessment & Plan Note (Signed)
running nose, fatigue, tested positive COVID, started Paxlovid 02/20/23, tolerated well. Denied fever, change of appetite, cough, or SOB.  Update CBC/diff, CMP/eGFR, TSH.

## 2023-02-22 NOTE — Assessment & Plan Note (Signed)
saw neurology. Hospitalized 8/12-8/19 for seizure with presentation of left sided weakness again. EEG showed focal seizure right frontal region. Takes Keppra. 

## 2023-02-22 NOTE — Progress Notes (Signed)
Location:   [facility]   [facility] of Service:  [facility] ([facility]) Provider: [facility] Srihitha Tagliaferri NP  [name], MD  Patient Care Team: [name], MD as PCP - General (Internal Medicine)  Extended Emergency Contact Information Primary Emergency Contact: Milon Dikes [place] of [place] Phone: [phone] Mobile Phone: [phone] Relation: Friend Secondary Emergency Contact: Rodman Key Address: [address]          [place] [address] Johnnette Litter of [place] Phone: [phone] Mobile Phone: [phone] Relation: Son  Code Status: DNR Goals of care: Advanced Directive information    12/11/2022    2:50 PM  Advanced Directives  Does Patient Have a Medical Advance Directive? Yes  Type of Advance Directive Out of facility DNR (pink MOST or yellow form)  Does patient want to make changes to medical advance directive? No - Patient declined     Chief Complaint  Patient presents with   Acute Visit    COVID infection.     HPI:  Pt is a 89 y.o. female seen today for an acute visit for running nose, fatigue, tested positive COVID, started Paxlovid 02/20/23, tolerated well. Denied fever, change of appetite, cough, or SOB.   Edema BLE, minimal swelling,  takes Furosemide, 06/20/20 EF 60-65%, Bun/creat 19/0.7 07/02/22             RLS takes Gabapentin qod to avoid possible AR,  off Requip.             Seizure, saw neurology. Hospitalized 8/12-8/19 for seizure with presentation of left sided weakness again. EEG showed focal seizure right frontal region. Takes Keppra.             Hypothyroidism, resumed original dose of Levothyroxine since increased to 88/75mg  12/25/22 per her request since it made no difference in her chronic low energy, TSH 2.09 12/22/22             Hyponatremia, stable, Na 132 07/02/22             Anxiety, not sleeping well, prn Lorazepam, failed Mirtazapine, desires no Ambien             GERD, stable, Hgb 12.2 07/02/22              Constipation, takes MiraLax, Senokot S             OA, left hip, s/p total left hip, multiple sites, takes Tylenol, Aleve, Gabapentin for pain in toes(failed GDR to qod), Tramadol.  failed Methocarbamol recommended by Ortho-felt wobbly.              OP Boniva, Ca, Vit D, declined DEXA, Vit D 16 10/07/21.              TIA, off  Atorvastatin per patient's request, on ASA      Past Medical History:  Diagnosis Date   Abdominal pain 02/26/2017   Anxiety    Arthritis    Left Knee, Wrist, Back and Hips   Cervical vertebral fusion    Diverticulitis    Diverticulosis    Fibromyalgia    GERD (gastroesophageal reflux disease)    Gout 02/2018   L hand   Hypertension     after d/c from hospital no current problems or medication   Hypothyroidism    Pelvis fracture ([place])    Peritoneal free air 03/29/2012   Pneumonia    Pneumoperitoneum 02/26/2017   Pre-diabetes  Seizure ([place])    Thyroid disorder    Toe pain 09/06/2016   Past Surgical History:  Procedure Laterality Date   ABDOMINAL HYSTERECTOMY     BOWEL RESECTION N/A 07/09/2017   Procedure: SMALL BOWEL RESECTION;  Surgeon: Johnathan Hausen, MD;  Location: [facility];  Service: General;  Laterality: N/A;   CERVICAL FUSION     x2   CONVERSION TO TOTAL HIP Left 10/31/2020   Procedure: CONVERSION TO ANTERIOR TOTAL HIP;  Surgeon: Paralee Cancel, MD;  Location: [facility];  Service: Orthopedics;  Laterality: Left;  2 hrs   FEMUR IM NAIL Left 03/03/2020   Procedure: INTRAMEDULLARY (IM) NAIL FEMORAL;  Surgeon: Paralee Cancel, MD;  Location: [facility];  Service: Orthopedics;  Laterality: Left;   LAPAROSCOPIC APPENDECTOMY N/A 03/04/2017   Procedure: APPENDECTOMY LAPAROSCOPIC;  Surgeon: Johnathan Hausen, MD;  Location: [facility];  Service: General;  Laterality: N/A;   LAPAROSCOPY N/A 03/04/2017   Procedure: LAPAROSCOPY DIAGNOSTIC, ENTEROLYSIS;  Surgeon: Johnathan Hausen, MD;  Location: [facility];  Service: General;  Laterality: N/A;   LAPAROTOMY N/A 07/09/2017   Procedure:  EXPLORATORY LAPAROTOMY;  Surgeon: Johnathan Hausen, MD;  Location: [facility];  Service: General;  Laterality: N/A;   SPINE SURGERY     Lumbar- rod placement   TONSILLECTOMY     89y/o   TOTAL VAGINAL HYSTERECTOMY      Allergies  Allergen Reactions   Codeine Rash   Penicillins Rash    Has patient had a PCN reaction causing immediate rash, facial/tongue/throat swelling, SOB or lightheadedness with hypotension: No Has patient had a PCN reaction causing severe rash involving mucus membranes or skin necrosis: No Has patient had a PCN reaction that required hospitalization: No Has patient had a PCN reaction occurring within the last 10 years: No If all of the above answers are "NO", then may proceed with Cephalosporin use.  Tolerated Cephalosporin 10/31/20     Levaquin [Levofloxacin]     Allergies as of 02/22/2023       Reactions   Codeine Rash   Penicillins Rash   Has patient had a PCN reaction causing immediate rash, facial/tongue/throat swelling, SOB or lightheadedness with hypotension: No Has patient had a PCN reaction causing severe rash involving mucus membranes or skin necrosis: No Has patient had a PCN reaction that required hospitalization: No Has patient had a PCN reaction occurring within the last 10 years: No If all of the above answers are "NO", then may proceed with Cephalosporin use. Tolerated Cephalosporin 10/31/20   Levaquin [levofloxacin]         Medication List        Accurate as of February 22, 2023  3:29 PM. If you have any questions, ask your nurse or doctor.          acetaminophen 500 MG tablet Commonly known as: TYLENOL Take 500 mg by mouth 2 (two) times daily as needed.   aspirin EC 81 MG tablet Take 81 mg by mouth daily. Swallow whole.   CALCIUM 500 PO Take 500 mg by mouth daily.   furosemide 20 MG tablet Commonly known as: LASIX Take 20 mg by mouth daily.   gabapentin 100 MG capsule Commonly known as: NEURONTIN Take 1 capsule (100 mg total)  by mouth at bedtime.   ibandronate 150 MG tablet Commonly known as: BONIVA Take 150 mg by mouth every 30 (thirty) days. Take in the morning with a full glass of water, on an empty stomach, and do not take anything else by mouth or  lie down for the next 30 min.   levETIRAcetam 500 MG 24 hr tablet Commonly known as: Keppra XR Take 1 tablet (500 mg total) by mouth daily.   levothyroxine 75 MCG tablet Commonly known as: SYNTHROID Take 1 tablet (75 mcg total) by mouth daily before breakfast.   LORazepam 0.5 MG tablet Commonly known as: Ativan Take 0.5 tablets (0.25 mg total) by mouth 2 (two) times daily as needed for anxiety.   mupirocin ointment 2 % Commonly known as: BACTROBAN Apply 1 application. topically 2 (two) times daily as needed.   naproxen sodium 220 MG tablet Commonly known as: ALEVE Take 220 mg by mouth 2 (two) times daily as needed.   Olopatadine HCl 0.7 % Soln Apply to eye daily as needed (One drop to each eye).   phenol 1.4 % Liqd Commonly known as: CHLORASEPTIC Use as directed 1 spray in the mouth or throat every 4 (four) hours as needed for throat irritation / pain.   polyethylene glycol 17 g packet Commonly known as: MIRALAX / GLYCOLAX Take 17 g by mouth daily.   sennosides-docusate sodium 8.6-50 MG tablet Commonly known as: SENOKOT-S Take 1 tablet by mouth at bedtime as needed for constipation.   traMADol 50 MG tablet Commonly known as: ULTRAM Take 1 tablet (50 mg total) by mouth every 6 (six) hours as needed.   vitamin D3 50 MCG (2000 UT) Caps Take 2,000 Units by mouth daily.   zolpidem 5 MG tablet Commonly known as: AMBIEN Take 5 mg by mouth at bedtime as needed for sleep.        Review of Systems  Constitutional:  Positive for fatigue. Negative for appetite change and fever.  HENT:  Positive for hearing loss and rhinorrhea. Negative for congestion, sinus pressure, sinus pain, sore throat, trouble swallowing and voice change.   Eyes:   Negative for redness and visual disturbance.  Respiratory:  Positive for shortness of breath. Negative for cough and wheezing.        DOE  Cardiovascular:  Positive for leg swelling.  Gastrointestinal:  Negative for abdominal pain and constipation.  Genitourinary:  Positive for frequency. Negative for dysuria and urgency.       Bathroom trips 1-2/night  Musculoskeletal:  Positive for arthralgias, back pain and gait problem.       Left hip pain, s/p total left hip arthroplasty. Multiple sites aches in am, better as day goes by. Pain in toes.   Skin:  Negative for color change.       Itching scaly scalp  Neurological:  Negative for seizures, weakness and headaches.       Restless legs at night.   Psychiatric/Behavioral:  Positive for sleep disturbance. Negative for behavioral problems. The patient is nervous/anxious.     Immunization History  Administered Date(s) Administered   Influenza Split 08/11/2018, 08/03/2019   Influenza-Unspecified 09/01/2018, 09/10/2020, 09/18/2021, 09/21/2022   Moderna Sars-Covid-2 Vaccination 12/04/2019, 01/01/2020   Pfizer Covid-19 Vaccine Bivalent Booster 77yrs & up 10/01/2022   Pneumococcal Conjugate-13 05/29/2014   Pneumococcal Polysaccharide-23 06/14/2015   Tdap 09/14/2012   Zoster Recombinat (Shingrix) 02/10/2022, 05/11/2022   Zoster, Live 04/30/2004, 04/13/2018, 07/05/2018   Pertinent  Health Maintenance Due  Topic Date Due   DEXA SCAN  Never done   INFLUENZA VACCINE  Completed      11/03/2020    9:15 PM 11/04/2020   12:00 PM 04/23/2021   11:34 AM 12/08/2022    9:52 AM 12/11/2022    2:50 PM  Fall Risk  Falls in the past year?    0 0  Was there an injury with Fall?    0 0  Fall Risk Category Calculator    0 0  Fall Risk Category (Retired)    Low Low  (RETIRED) Patient Fall Risk Level High fall risk High fall risk Moderate fall risk Low fall risk Low fall risk  Patient at Risk for Falls Due to    No Fall Risks No Fall Risks  Fall risk Follow up     Falls evaluation completed Falls evaluation completed   Functional Status Survey:    Vitals:   02/22/23 1511  BP: 122/60  Pulse: 82  Resp: 20  Temp: 97.6 F (36.4 C)  SpO2: 94%   There is no height or weight on file to calculate BMI. Physical Exam Constitutional:      Comments: tired  HENT:     Head: Normocephalic and atraumatic.     Nose: Congestion and rhinorrhea present.     Mouth/Throat:     Mouth: Mucous membranes are moist.  Eyes:     Extraocular Movements: Extraocular movements intact.     Pupils: Pupils are equal, round, and reactive to light.  Cardiovascular:     Rate and Rhythm: Normal rate and regular rhythm.     Heart sounds: No murmur heard.    Comments: Weak left DP pulse, more symptomatic tingling sensation at night.  Pulmonary:     Effort: Pulmonary effort is normal.     Breath sounds: No wheezing, rhonchi or rales.  Abdominal:     General: Bowel sounds are normal.     Palpations: Abdomen is soft.     Tenderness: There is no abdominal tenderness.  Musculoskeletal:        General: Tenderness present.     Cervical back: Normal range of motion and neck supple.     Right lower leg: Edema present.     Left lower leg: Edema present.     Comments: mild scoliosis. Hx of back surgeries x3 with left sciatica pain. s/p total left hip replacement. Trace edema BLE  Skin:    General: Skin is warm and dry.     Comments: Itching scaly scalp  Neurological:     General: No focal deficit present.     Mental Status: She is alert and oriented to person, place, and time. Mental status is at baseline.     Gait: Gait abnormal.     Comments: Using walker sometimes.   Psychiatric:        Mood and Affect: Mood normal.        Behavior: Behavior normal.        Thought Content: Thought content normal.        Judgment: Judgment normal.     Labs reviewed: Recent Labs    05/21/22 0000 06/09/22 0000 07/02/22 0000  NA 135* 134* 132*  K 4.6 4.4 4.6  CL 102 98* 99   CO2 25* 26* 26*  BUN  --  16 19  CREATININE 0.7 0.8 0.7  CALCIUM 9.6 9.9 9.1   Recent Labs    07/02/22 0000  AST 19  ALT 12  ALKPHOS 43  ALBUMIN 3.8   Recent Labs    06/09/22 0000 07/02/22 0000  WBC 6.9 5.0  NEUTROABS 4,195.00 2,550.00  HGB 13.7 12.2  HCT 40 35*  PLT 384 420*   Lab Results  Component Value Date   TSH 1.30 07/02/2022   Lab Results  Component  Value Date   HGBA1C 5.8 (H) 10/21/2020   Lab Results  Component Value Date   CHOL 171 06/20/2020   HDL 59 06/20/2020   LDLCALC 94 06/20/2020   TRIG 89 06/20/2020   CHOLHDL 2.9 06/20/2020    Significant Diagnostic Results in last 30 days:  No results found.  Assessment/Plan: COVID-19 virus infection running nose, fatigue, tested positive COVID, started Paxlovid 02/20/23, tolerated well. Denied fever, change of appetite, cough, or SOB.  Update CBC/diff, CMP/eGFR, TSH.   Venous insufficiency minimal swelling,  takes Furosemide, 06/20/20 EF 60-65%, Bun/creat 19/0.7 07/02/22  Neuropathy  RLS/peripheral neuropathy, takes Gabapentin qod to avoid possible AR,  off Requip.  Seizures [facility]) saw neurology. Hospitalized 8/12-8/19 for seizure with presentation of left sided weakness again. EEG showed focal seizure right frontal region. Takes Keppra.  Hypothyroidism resumed original dose of Levothyroxine since increased to 88/75mg  12/25/22 per her request since it made no difference in her chronic low energy, TSH 2.09 12/22/22  Hyponatremia Chronic Na 130s, update CMP/eGFR in setting of COVID infection.   Insomnia secondary to anxiety not sleeping well, prn Lorazepam, failed Mirtazapine, desires no Ambien    Family/ staff Communication: plan of care reviewed with the patient and charge nurse.   Labs/tests ordered: CBC/diff, CMP/eGFR, TSH  Time spend 40 minutes.

## 2023-02-22 NOTE — Progress Notes (Signed)
This encounter was created in error - please disregard.

## 2023-02-22 NOTE — Assessment & Plan Note (Signed)
left hip, s/p total left hip, multiple sites, takes Tylenol, Aleve, Gabapentin for pain in toes(failed GDR to qod), Tramadol.  failed Methocarbamol recommended by Ortho-felt wobbly.

## 2023-02-23 LAB — HEPATIC FUNCTION PANEL
ALT: 10 U/L (ref 7–35)
AST: 17 (ref 13–35)
Alkaline Phosphatase: 44 (ref 25–125)
Bilirubin, Total: 0.4

## 2023-02-23 LAB — CBC AND DIFFERENTIAL
HCT: 36 (ref 36–46)
Hemoglobin: 12.2 (ref 12.0–16.0)
Neutrophils Absolute: 1860
Platelets: 365 10*3/uL (ref 150–400)
WBC: 3.9

## 2023-02-23 LAB — TSH: TSH: 1.23 (ref 0.41–5.90)

## 2023-02-23 LAB — BASIC METABOLIC PANEL
BUN: 11 (ref 4–21)
CO2: 28 — AB (ref 13–22)
Chloride: 99 (ref 99–108)
Creatinine: 0.7 (ref 0.5–1.1)
Glucose: 91
Potassium: 4.7 mEq/L (ref 3.5–5.1)
Sodium: 132 — AB (ref 137–147)

## 2023-02-23 LAB — COMPREHENSIVE METABOLIC PANEL
Albumin: 3.7 (ref 3.5–5.0)
Calcium: 9 (ref 8.7–10.7)
Globulin: 2.3

## 2023-02-23 LAB — CBC: RBC: 3.73 — AB (ref 3.87–5.11)

## 2023-03-17 ENCOUNTER — Non-Acute Institutional Stay: Payer: Medicare Other | Admitting: Nurse Practitioner

## 2023-03-17 ENCOUNTER — Encounter: Payer: Self-pay | Admitting: Nurse Practitioner

## 2023-03-17 DIAGNOSIS — E039 Hypothyroidism, unspecified: Secondary | ICD-10-CM

## 2023-03-17 DIAGNOSIS — R569 Unspecified convulsions: Secondary | ICD-10-CM

## 2023-03-17 DIAGNOSIS — E871 Hypo-osmolality and hyponatremia: Secondary | ICD-10-CM

## 2023-03-17 DIAGNOSIS — I872 Venous insufficiency (chronic) (peripheral): Secondary | ICD-10-CM

## 2023-03-17 DIAGNOSIS — M79675 Pain in left toe(s): Secondary | ICD-10-CM

## 2023-03-17 DIAGNOSIS — F5105 Insomnia due to other mental disorder: Secondary | ICD-10-CM

## 2023-03-17 DIAGNOSIS — G2581 Restless legs syndrome: Secondary | ICD-10-CM

## 2023-03-17 DIAGNOSIS — M8000XS Age-related osteoporosis with current pathological fracture, unspecified site, sequela: Secondary | ICD-10-CM

## 2023-03-17 DIAGNOSIS — F419 Anxiety disorder, unspecified: Secondary | ICD-10-CM

## 2023-03-17 DIAGNOSIS — K219 Gastro-esophageal reflux disease without esophagitis: Secondary | ICD-10-CM

## 2023-03-17 DIAGNOSIS — K5901 Slow transit constipation: Secondary | ICD-10-CM

## 2023-03-17 DIAGNOSIS — M159 Polyosteoarthritis, unspecified: Secondary | ICD-10-CM

## 2023-03-17 DIAGNOSIS — R350 Frequency of micturition: Secondary | ICD-10-CM

## 2023-03-17 NOTE — Progress Notes (Signed)
Location:   [facility] Room Number: [phone] Place of Service:  ALF [phone]) Provider:  Deatra Robinson, NP  Mahlon Gammon, MD  Patient Care Team: Mahlon Gammon, MD as PCP - General (Internal Medicine)  Extended Emergency Contact Information Primary Emergency Contact: Avanell Shackleton [place] Home Phone: [phone] Mobile Phone: [phone] Relation: Friend Secondary Emergency Contact: Bradly Chris Address: [address]          [address]          [place], [place] [address] [place] of [place] Home Phone: [phone] Mobile Phone: [phone] Relation: Son  Code Status:  DNR Goals of care: Advanced Directive information    03/17/2023   11:53 AM  Advanced Directives  Does Patient Have a Medical Advance Directive? Yes  Type of Advance Directive Living will;Out of facility DNR (pink MOST or yellow form)  Does patient want to make changes to medical advance directive? No - Patient declined  Pre-existing out of facility DNR order (yellow form or pink MOST form) Pink MOST form placed in chart (order not valid for inpatient use);Yellow form placed in chart (order not valid for inpatient use)     Chief Complaint  Patient presents with   Acute Visit    Toe pain    HPI:  Pt is a 89 y.o. female seen today for an acute visit for c/o the left 3rd pain, noted redness, sore to touch, no open area or swelling.       Edema BLE, minimal swelling,  takes Furosemide, 06/20/20 EF 60-65%, Bun/creat 11/0.7 02/23/23             RLS takes Gabapentin qod to avoid possible AR,  off Requip.             Seizure, saw neurology. Hospitalized 8/12-8/19 for seizure with presentation of left sided weakness again. EEG showed focal seizure right frontal region. Takes Keppra.             Hypothyroidism, resumed original dose of Levothyroxine since increased to 88/75mg  12/25/22 per her request since it made no difference in her chronic low energy, TSH 2.09 12/22/22              Hyponatremia, stable, Na 132 02/23/23             Anxiety, not sleeping well, prn Lorazepam, started Lexapro  qd, failed Mirtazapine, desires no Ambien             GERD, stable, Hgb 12.2 02/23/23             Constipation, takes MiraLax, Senokot S             OA, left hip, s/p total left hip, multiple sites, takes Tylenol, Aleve, Gabapentin for pain in toes(failed GDR to qod), Tramadol.  failed Methocarbamol recommended by Ortho-felt wobbly.              OP Boniva, Ca, Vit D, declined DEXA, Vit D 16 10/07/21.              TIA, off  Atorvastatin per patient's request, on ASA   Urinary frequency, prolapsed bladder, desires tx, may try Myrbetriq        Past Medical History:  Diagnosis Date   Abdominal pain 02/26/2017   Anxiety    Arthritis    Left Knee, Wrist, Back and Hips   Cervical vertebral fusion    Diverticulitis    Diverticulosis    Fibromyalgia  GERD (gastroesophageal reflux disease)    Gout 02/2018   L hand   Hypertension     after d/c from hospital no current problems or medication   Hypothyroidism    Pelvis fracture    Peritoneal free air 03/29/2012   Pneumonia    Pneumoperitoneum 02/26/2017   Pre-diabetes    Seizure    Thyroid disorder    Toe pain 09/06/2016   Past Surgical History:  Procedure Laterality Date   ABDOMINAL HYSTERECTOMY     BOWEL RESECTION N/A 07/09/2017   Procedure: SMALL BOWEL RESECTION;  Surgeon: Luretha Murphy, MD;  Location: [facility];  Service: General;  Laterality: N/A;   CERVICAL FUSION     x2   CONVERSION TO TOTAL HIP Left 10/31/2020   Procedure: CONVERSION TO ANTERIOR TOTAL HIP;  Surgeon: Durene Romans, MD;  Location: [facility];  Service: Orthopedics;  Laterality: Left;  2 hrs   FEMUR IM NAIL Left 03/03/2020   Procedure: INTRAMEDULLARY (IM) NAIL FEMORAL;  Surgeon: Durene Romans, MD;  Location: [facility];  Service: Orthopedics;  Laterality: Left;   LAPAROSCOPIC APPENDECTOMY N/A 03/04/2017   Procedure: APPENDECTOMY LAPAROSCOPIC;  Surgeon:  Luretha Murphy, MD;  Location: [facility];  Service: General;  Laterality: N/A;   LAPAROSCOPY N/A 03/04/2017   Procedure: LAPAROSCOPY DIAGNOSTIC, ENTEROLYSIS;  Surgeon: Luretha Murphy, MD;  Location: [facility];  Service: General;  Laterality: N/A;   LAPAROTOMY N/A 07/09/2017   Procedure: EXPLORATORY LAPAROTOMY;  Surgeon: Luretha Murphy, MD;  Location: [facility];  Service: General;  Laterality: N/A;   SPINE SURGERY     Lumbar- rod placement   TONSILLECTOMY     89y/o   TOTAL VAGINAL HYSTERECTOMY      Allergies  Allergen Reactions   Codeine Rash   Penicillins Rash    Has patient had a PCN reaction causing immediate rash, facial/tongue/throat swelling, SOB or lightheadedness with hypotension: No Has patient had a PCN reaction causing severe rash involving mucus membranes or skin necrosis: No Has patient had a PCN reaction that required hospitalization: No Has patient had a PCN reaction occurring within the last 10 years: No If all of the above answers are "NO", then may proceed with Cephalosporin use.  Tolerated Cephalosporin 10/31/20     Levaquin [Levofloxacin]     Allergies as of 03/17/2023       Reactions   Codeine Rash   Penicillins Rash   Has patient had a PCN reaction causing immediate rash, facial/tongue/throat swelling, SOB or lightheadedness with hypotension: No Has patient had a PCN reaction causing severe rash involving mucus membranes or skin necrosis: No Has patient had a PCN reaction that required hospitalization: No Has patient had a PCN reaction occurring within the last 10 years: No If all of the above answers are "NO", then may proceed with Cephalosporin use. Tolerated Cephalosporin 10/31/20   Levaquin [levofloxacin]         Medication List        Accurate as of March 17, 2023 11:59 PM. If you have any questions, ask your nurse or doctor.          STOP taking these medications    mupirocin ointment 2 % Commonly known as: BACTROBAN Stopped by: Makylah Bossard X Korra Christine, NP    naproxen sodium 220 MG tablet Commonly known as: ALEVE Stopped by: Mohammedali Bedoy X Arles Rumbold, NP   phenol 1.4 % Liqd Commonly known as: CHLORASEPTIC Stopped by: Tawonna Esquer X Jaison Petraglia, NP       TAKE these medications    acetaminophen  500 MG tablet Commonly known as: TYLENOL Take 500 mg by mouth 2 (two) times daily as needed.   aspirin EC 81 MG tablet Take 81 mg by mouth daily. Swallow whole.   CALCIUM 500 PO Take 500 mg by mouth daily.   cyanocobalamin 1000 MCG tablet Commonly known as: VITAMIN B12 Take 1,000 mcg by mouth daily.   escitalopram 5 MG tablet Commonly known as: LEXAPRO Take 5 mg by mouth daily.   furosemide 20 MG tablet Commonly known as: LASIX Take 20 mg by mouth daily.   gabapentin 100 MG capsule Commonly known as: NEURONTIN Take 1 capsule (100 mg total) by mouth at bedtime.   ibandronate 150 MG tablet Commonly known as: BONIVA Take 150 mg by mouth every 30 (thirty) days. Take in the morning with a full glass of water, on an empty stomach, and do not take anything else by mouth or lie down for the next 30 min.   ibuprofen 200 MG tablet Commonly known as: ADVIL Take 200 mg by mouth daily as needed.   levETIRAcetam 500 MG 24 hr tablet Commonly known as: Keppra XR Take 1 tablet (500 mg total) by mouth daily.   levothyroxine 75 MCG tablet Commonly known as: SYNTHROID Take 1 tablet (75 mcg total) by mouth daily before breakfast.   LORazepam 0.5 MG tablet Commonly known as: Ativan Take 0.5 tablets (0.25 mg total) by mouth 2 (two) times daily as needed for anxiety.   Olopatadine HCl 0.7 % Soln Apply to eye daily as needed (One drop to each eye).   polyethylene glycol 17 g packet Commonly known as: MIRALAX / GLYCOLAX Take 17 g by mouth daily.   sennosides-docusate sodium 8.6-50 MG tablet Commonly known as: SENOKOT-S Take 1 tablet by mouth at bedtime as needed for constipation.   sodium fluoride 1.1 % Gel dental gel Commonly known as: FLUORISHIELD Place 1  Application onto teeth daily in the afternoon.   traMADol 50 MG tablet Commonly known as: ULTRAM Take 1 tablet (50 mg total) by mouth every 6 (six) hours as needed.   vitamin D3 50 MCG (2000 UT) Caps Take 2,000 Units by mouth daily.   zolpidem 5 MG tablet Commonly known as: AMBIEN Take 5 mg by mouth at bedtime as needed for sleep.        Review of Systems  Constitutional:  Negative for appetite change, fatigue and fever.  HENT:  Positive for hearing loss. Negative for congestion.   Eyes:  Negative for redness and visual disturbance.  Respiratory:  Positive for shortness of breath. Negative for cough and wheezing.        DOE  Cardiovascular:  Positive for leg swelling.  Gastrointestinal:  Negative for abdominal pain and constipation.  Genitourinary:  Positive for frequency. Negative for dysuria and urgency.       Bathroom trips 3-4x/night  Musculoskeletal:  Positive for arthralgias, back pain and gait problem.       Left hip pain, s/p total left hip arthroplasty. Multiple sites aches in am, better as day goes by. Pain in toes.   Skin:        Left 3rd toe sore, warmth, no increased swelling or open wound  Neurological:  Negative for seizures, weakness and headaches.       Restless legs at night.   Psychiatric/Behavioral:  Positive for sleep disturbance. Negative for behavioral problems. The patient is nervous/anxious.     Immunization History  Administered Date(s) Administered   Influenza Split 08/11/2018, 08/03/2019  Influenza-Unspecified 09/01/2018, 09/10/2020, 09/18/2021, 09/21/2022   Moderna Sars-Covid-2 Vaccination 12/04/2019, 01/01/2020   Pfizer Covid-19 Vaccine Bivalent Booster 75yrs & up 10/01/2022   Pneumococcal Conjugate-13 05/29/2014   Pneumococcal Polysaccharide-23 06/14/2015   Tdap 09/14/2012   Zoster Recombinat (Shingrix) 02/10/2022, 05/11/2022   Zoster, Live 04/30/2004, 04/13/2018, 07/05/2018   Pertinent  Health Maintenance Due  Topic Date Due   DEXA  SCAN  Never done   INFLUENZA VACCINE  07/01/2023      11/03/2020    9:15 PM 11/04/2020   12:00 PM 04/23/2021   11:34 AM 12/08/2022    9:52 AM 12/11/2022    2:50 PM  Fall Risk  Falls in the past year?    0 0  Was there an injury with Fall?    0 0  Fall Risk Category Calculator    0 0  Fall Risk Category (Retired)    Low Low  (RETIRED) Patient Fall Risk Level High fall risk High fall risk Moderate fall risk Low fall risk Low fall risk  Patient at Risk for Falls Due to    No Fall Risks No Fall Risks  Fall risk Follow up    Falls evaluation completed Falls evaluation completed   Functional Status Survey:    Vitals:   03/17/23 1504  BP: (!) 157/97  Pulse: 76  Resp: 18  Temp: (!) 97 F (36.1 C)  SpO2: 96%   There is no height or weight on file to calculate BMI. Physical Exam Constitutional:      Appearance: Normal appearance.  HENT:     Head: Normocephalic and atraumatic.     Mouth/Throat:     Mouth: Mucous membranes are moist.  Eyes:     Extraocular Movements: Extraocular movements intact.     Pupils: Pupils are equal, round, and reactive to light.  Cardiovascular:     Rate and Rhythm: Normal rate and regular rhythm.     Heart sounds: No murmur heard.    Comments: Weak left DP pulse, more symptomatic tingling sensation at night.  Pulmonary:     Effort: Pulmonary effort is normal.     Breath sounds: No rales.  Abdominal:     General: Bowel sounds are normal.     Palpations: Abdomen is soft.     Tenderness: There is no abdominal tenderness.  Musculoskeletal:        General: Tenderness present.     Cervical back: Normal range of motion and neck supple.     Right lower leg: Edema present.     Left lower leg: Edema present.     Comments: mild scoliosis. Hx of back surgeries x3 with left sciatica pain. s/p total left hip replacement. Trace edema BLE  Skin:    General: Skin is warm and dry.     Findings: Erythema present.     Comments: Left 3rd toe sore, warmth, no  increased swelling or open wound  Neurological:     General: No focal deficit present.     Mental Status: She is alert and oriented to person, place, and time. Mental status is at baseline.     Gait: Gait abnormal.     Comments: Using walker sometimes.   Psychiatric:        Mood and Affect: Mood normal.        Behavior: Behavior normal.        Thought Content: Thought content normal.        Judgment: Judgment normal.     Labs reviewed: Recent  Labs    05/21/22 0000 06/09/22 0000 07/02/22 0000  NA 135* 134* 132*  K 4.6 4.4 4.6  CL 102 98* 99  CO2 25* 26* 26*  BUN  --  16 19  CREATININE 0.7 0.8 0.7  CALCIUM 9.6 9.9 9.1   Recent Labs    07/02/22 0000  AST 19  ALT 12  ALKPHOS 43  ALBUMIN 3.8   Recent Labs    06/09/22 0000 07/02/22 0000  WBC 6.9 5.0  NEUTROABS 4,195.00 2,550.00  HGB 13.7 12.2  HCT 40 35*  PLT 384 420*   Lab Results  Component Value Date   TSH 1.30 07/02/2022   Lab Results  Component Value Date   HGBA1C 5.8 (H) 10/21/2020   Lab Results  Component Value Date   CHOL 171 06/20/2020   HDL 59 06/20/2020   LDLCALC 94 06/20/2020   TRIG 89 06/20/2020   CHOLHDL 2.9 06/20/2020    Significant Diagnostic Results in last 30 days:  No results found.  Assessment/Plan Pain of toe of left foot  c/o the left 3rd pain, noted redness, sore to touch, no open area or swelling, no trauma or injury, encourage open toe shoe, see Podiatry.   Venous insufficiency minimal swelling,  takes Furosemide, 06/20/20 EF 60-65%  Restless leg syndrome  RLS takes Gabapentin qod to avoid possible AR,  off Requip.  Seizures [facility])  saw neurology. Hospitalized 8/12-8/19 for seizure with presentation of left sided weakness again. EEG showed focal seizure right frontal region. Takes Keppra.  Hypothyroidism  resumed original dose of Levothyroxine since increased to 88/75mg  12/25/22 per her request since it made no difference in her chronic low energy, TSH 1.23  02/23/23  Hyponatremia stable, Na 132 02/23/23  Insomnia secondary to anxiety No significant improvement, not sleeping well, prn Lorazepam, also started Lexapro 5mg  qd, failed Mirtazapine, desires no Ambien  GERD (gastroesophageal reflux disease)  stable, Hgb 12.2 02/23/23  Slow transit constipation Stable, takes MiraLax, Senokot S  Osteoarthritis, multiple sites  OA, left hip, s/p total left hip, multiple sites, takes Tylenol, Aleve, Gabapentin for pain in toes(failed GDR to qod), Tramadol.  failed Methocarbamol recommended by Ortho-felt wobbly.   Osteoporosis Boniva, Ca, Vit D, declined DEXA, Vit D 16 10/07/21.   Urinary frequency Will try Myrbetriq 25mg  qd.      Family/ staff Communication: plan of care reviewed with the patient and charge nurse.   Labs/tests ordered:   none  Time spend 40 minutes.

## 2023-03-17 NOTE — Assessment & Plan Note (Signed)
stable, Hgb 12.2 02/23/23

## 2023-03-17 NOTE — Assessment & Plan Note (Signed)
saw neurology. Hospitalized 8/12-8/19 for seizure with presentation of left sided weakness again. EEG showed focal seizure right frontal region. Takes Keppra. 

## 2023-03-17 NOTE — Assessment & Plan Note (Signed)
Boniva, Ca, Vit D, declined DEXA, Vit D 16 10/07/21.  

## 2023-03-17 NOTE — Assessment & Plan Note (Signed)
c/o the left 3rd pain, noted redness, sore to touch, no open area or swelling, no trauma or injury, encourage open toe shoe, see Podiatry.

## 2023-03-17 NOTE — Assessment & Plan Note (Signed)
Will try Myrbetriq  qd.

## 2023-03-17 NOTE — Assessment & Plan Note (Signed)
resumed original dose of Levothyroxine since increased to 88/75mg  12/25/22 per her request since it made no difference in her chronic low energy, TSH 1.23 02/23/23

## 2023-03-17 NOTE — Assessment & Plan Note (Signed)
RLS takes Gabapentin qod to avoid possible AR,  off Requip.

## 2023-03-17 NOTE — Assessment & Plan Note (Signed)
stable, Na 132 02/23/23

## 2023-03-17 NOTE — Assessment & Plan Note (Addendum)
minimal swelling,  takes Furosemide, 06/20/20 EF 60-65%

## 2023-03-17 NOTE — Assessment & Plan Note (Signed)
OA, left hip, s/p total left hip, multiple sites, takes Tylenol, Aleve, Gabapentin for pain in toes(failed GDR to qod), Tramadol.  failed Methocarbamol recommended by Ortho-felt wobbly.

## 2023-03-17 NOTE — Assessment & Plan Note (Signed)
Stable, takes MiraLax, Senokot S 

## 2023-03-17 NOTE — Assessment & Plan Note (Addendum)
No significant improvement, not sleeping well, prn Lorazepam, also started Lexapro  qd, failed Mirtazapine, desires no Ambien

## 2023-03-25 LAB — VITAMIN B12: Vitamin B-12: 818

## 2023-05-19 ENCOUNTER — Encounter: Payer: Self-pay | Admitting: Nurse Practitioner

## 2023-05-19 ENCOUNTER — Non-Acute Institutional Stay: Payer: Medicare Other | Admitting: Nurse Practitioner

## 2023-05-19 DIAGNOSIS — G2581 Restless legs syndrome: Secondary | ICD-10-CM

## 2023-05-19 DIAGNOSIS — K5901 Slow transit constipation: Secondary | ICD-10-CM

## 2023-05-19 DIAGNOSIS — M8000XS Age-related osteoporosis with current pathological fracture, unspecified site, sequela: Secondary | ICD-10-CM

## 2023-05-19 DIAGNOSIS — I872 Venous insufficiency (chronic) (peripheral): Secondary | ICD-10-CM

## 2023-05-19 DIAGNOSIS — R350 Frequency of micturition: Secondary | ICD-10-CM | POA: Diagnosis not present

## 2023-05-19 DIAGNOSIS — K219 Gastro-esophageal reflux disease without esophagitis: Secondary | ICD-10-CM

## 2023-05-19 DIAGNOSIS — F5105 Insomnia due to other mental disorder: Secondary | ICD-10-CM

## 2023-05-19 DIAGNOSIS — M159 Polyosteoarthritis, unspecified: Secondary | ICD-10-CM | POA: Diagnosis not present

## 2023-05-19 DIAGNOSIS — F419 Anxiety disorder, unspecified: Secondary | ICD-10-CM

## 2023-05-19 DIAGNOSIS — M810 Age-related osteoporosis without current pathological fracture: Secondary | ICD-10-CM

## 2023-05-19 DIAGNOSIS — E039 Hypothyroidism, unspecified: Secondary | ICD-10-CM

## 2023-05-19 DIAGNOSIS — G459 Transient cerebral ischemic attack, unspecified: Secondary | ICD-10-CM | POA: Diagnosis not present

## 2023-05-19 DIAGNOSIS — E871 Hypo-osmolality and hyponatremia: Secondary | ICD-10-CM

## 2023-05-19 DIAGNOSIS — R569 Unspecified convulsions: Secondary | ICD-10-CM

## 2023-05-19 NOTE — Assessment & Plan Note (Signed)
Stable, takes MiraLax, Senokot S 

## 2023-05-19 NOTE — Assessment & Plan Note (Signed)
stable, Na 132 02/23/23 

## 2023-05-19 NOTE — Assessment & Plan Note (Signed)
minimal swelling,  takes Furosemide, 06/20/20 EF 60-65%, Bun/creat 11/0.7 02/23/23

## 2023-05-19 NOTE — Progress Notes (Signed)
Location:  [facility] Room Number: AL/[id number] Place of Service:  ALF (13) Provider:  Kayzlee Wirtanen X, NP Mahlon Gammon, MD  Patient Care Team: Mahlon Gammon, MD as PCP - General (Internal Medicine)  Extended Emergency Contact Information Primary Emergency Contact: Avanell Shackleton States of [place] Home Phone: [phone] Mobile Phone: [phone] Relation: Friend Secondary Emergency Contact: Bradly Chris Address: [address]          [address]          [place], [place] [address] Darden Amber of [place] Home Phone: [phone] Mobile Phone: [phone] Relation: Son  Code Status:  DNR Goals of care: Advanced Directive information    05/19/2023    9:23 AM  Advanced Directives  Does Patient Have a Medical Advance Directive? Yes  Type of Estate agent of [place];Out of facility DNR (pink MOST or yellow form);Living will  Does patient want to make changes to medical advance directive? No - Patient declined  Copy of Healthcare Power of Attorney in Chart? Yes - validated most recent copy scanned in chart (See row information)  Pre-existing out of facility DNR order (yellow form or pink MOST form) Pink MOST form placed in chart (order not valid for inpatient use);Yellow form placed in chart (order not valid for inpatient use)     Chief Complaint  Patient presents with   Medical Management of Chronic Issues    Patient is here for a follow up for chronic conditions   Quality Metric Gaps    Patient is due for Tdap vaccine Discuss need for Dexa Scan    HPI:  Pt is a 89 y.o. female seen today for medical management of chronic diseases.     Edema BLE, minimal swelling,  takes Furosemide, 06/20/20 EF 60-65%, Bun/creat 11/0.7 02/23/23             RLS takes Gabapentin qod to avoid possible AR,  off Requip.             Seizure, saw neurology. Hospitalized 8/12-8/19 for seizure with presentation of left sided weakness again. EEG  showed focal seizure right frontal region. Takes Keppra.             Hypothyroidism, resumed original dose of Levothyroxine since increased to 88/75mg  12/25/22 per her request since it made no difference in her chronic low energy, TSH 2.09 12/22/22             Hyponatremia, stable, Na 132 02/23/23             Anxiety, not sleeping well, prn Lorazepam, started Lexapro 5mg  qd, failed Mirtazapine, desires no Ambien             GERD, stable, Hgb 12.2 02/23/23             Constipation, takes MiraLax, Senokot S             OA, left hip, s/p total left hip, multiple sites, takes Tylenol, Aleve, Gabapentin for pain in toes(failed GDR to qod), Tramadol.  failed Methocarbamol recommended by Ortho-felt wobbly.              OP Boniva, Ca, Vit D, declined DEXA, Vit D 16 10/07/21.              TIA, off  Atorvastatin per patient's request, on ASA              Urinary frequency, prolapsed bladder, desires tx, declined Myrbetriq 2/2 to the cost.  Past Medical History:  Diagnosis Date   Abdominal pain 02/26/2017   Anxiety    Arthritis    Left Knee, Wrist, Back and Hips   Cervical vertebral fusion    Diverticulitis    Diverticulosis    Fibromyalgia    GERD (gastroesophageal reflux disease)    Gout 02/2018   L hand   Hypertension     after d/c from hospital no current problems or medication   Hypothyroidism    Pelvis fracture (HCC)    Peritoneal free air 03/29/2012   Pneumonia    Pneumoperitoneum 02/26/2017   Pre-diabetes    Seizure (HCC)    Thyroid disorder    Toe pain 09/06/2016   Past Surgical History:  Procedure Laterality Date   ABDOMINAL HYSTERECTOMY     BOWEL RESECTION N/A 07/09/2017   Procedure: SMALL BOWEL RESECTION;  Surgeon: Luretha Murphy, MD;  Location: [facility];  Service: General;  Laterality: N/A;   CERVICAL FUSION     x2   CONVERSION TO TOTAL HIP Left 10/31/2020   Procedure: CONVERSION TO ANTERIOR TOTAL HIP;  Surgeon: Durene Romans, MD;  Location: [facility];  Service: Orthopedics;   Laterality: Left;  2 hrs   FEMUR IM NAIL Left 03/03/2020   Procedure: INTRAMEDULLARY (IM) NAIL FEMORAL;  Surgeon: Durene Romans, MD;  Location: [facility];  Service: Orthopedics;  Laterality: Left;   LAPAROSCOPIC APPENDECTOMY N/A 03/04/2017   Procedure: APPENDECTOMY LAPAROSCOPIC;  Surgeon: Luretha Murphy, MD;  Location: [facility];  Service: General;  Laterality: N/A;   LAPAROSCOPY N/A 03/04/2017   Procedure: LAPAROSCOPY DIAGNOSTIC, ENTEROLYSIS;  Surgeon: Luretha Murphy, MD;  [facility]: [facility];  Service: General;  Laterality: N/A;   LAPAROTOMY N/A 07/09/2017   Procedure: EXPLORATORY LAPAROTOMY;  Surgeon: Luretha Murphy, MD;  [facility]: [facility];  Service: General;  Laterality: N/A;   SPINE SURGERY     Lumbar- rod placement   TONSILLECTOMY     89y/o   TOTAL VAGINAL HYSTERECTOMY      Allergies  Allergen Reactions   Codeine Rash   Penicillins Rash    Has patient had a PCN reaction causing immediate rash, facial/tongue/throat swelling, SOB or lightheadedness with hypotension: No Has patient had a PCN reaction causing severe rash involving mucus membranes or skin necrosis: No Has patient had a PCN reaction that required hospitalization: No Has patient had a PCN reaction occurring within the last 10 years: No If all of the above answers are "NO", then may proceed with Cephalosporin use.  Tolerated Cephalosporin 10/31/20     Levaquin [Levofloxacin]     Outpatient Encounter Medications as of 05/19/2023  Medication Sig   acetaminophen (TYLENOL) 500 MG tablet Take 500 mg by mouth 2 (two) times daily as needed.   aspirin EC 81 MG tablet Take 81 mg by mouth daily. Swallow whole.   Calcium Carbonate (CALCIUM 500 PO) Take 500 mg by mouth daily.    cyanocobalamin (VITAMIN B12) 1000 MCG tablet Take 1,000 mcg by mouth daily.   escitalopram (LEXAPRO) 5 MG tablet Take 5 mg by mouth daily.   furosemide (LASIX) 20 MG tablet Take 20 mg by mouth daily.   gabapentin (NEURONTIN) 100 MG capsule Take 1 capsule (100 mg  total) by mouth at bedtime.   ibandronate (BONIVA) 150 MG tablet Take 150 mg by mouth every 30 (thirty) days. Take in the morning with a full glass of water, on an empty stomach, and do not take anything else by mouth or lie down for the next 30 min.  ibuprofen (ADVIL) 200 MG tablet Take 200 mg by mouth daily as needed.   levETIRAcetam (KEPPRA XR) 500 MG 24 hr tablet Take 1 tablet (500 mg total) by mouth daily.   levothyroxine (SYNTHROID) 75 MCG tablet Take 1 tablet (75 mcg total) by mouth daily before breakfast.   LORazepam (ATIVAN) 0.5 MG tablet Take 0.5 tablets (0.25 mg total) by mouth 2 (two) times daily as needed for anxiety.   Olopatadine HCl 0.7 % SOLN Apply to eye daily as needed (One drop to each eye).   polyethylene glycol (MIRALAX / GLYCOLAX) 17 g packet Take 17 g by mouth daily.    sennosides-docusate sodium (SENOKOT-S) 8.6-50 MG tablet Take 1 tablet by mouth at bedtime as needed for constipation.   sodium fluoride (FLUORISHIELD) 1.1 % GEL dental gel Place 1 Application onto teeth daily in the afternoon.   traMADol (ULTRAM) 50 MG tablet Take 1 tablet (50 mg total) by mouth every 6 (six) hours as needed.   Vitamin D, Cholecalciferol, 50 MCG (2000 UT) CAPS Take 2,000 Units by mouth daily.    zolpidem (AMBIEN) 5 MG tablet Take 5 mg by mouth at bedtime as needed for sleep.   No facility-administered encounter medications on file as of 05/19/2023.    Review of Systems  Constitutional:  Negative for appetite change, fatigue and fever.  HENT:  Positive for hearing loss. Negative for congestion.   Eyes:  Negative for redness and visual disturbance.  Respiratory:  Positive for shortness of breath. Negative for cough and wheezing.        DOE  Cardiovascular:  Positive for leg swelling.  Gastrointestinal:  Negative for abdominal pain and constipation.  Genitourinary:  Positive for frequency. Negative for dysuria and urgency.       Bathroom trips 3-4x/night  Musculoskeletal:  Positive for  arthralgias, back pain and gait problem.       Left hip pain, s/p total left hip arthroplasty. Multiple sites aches in am, better as day goes by. Pain in toes.   Skin:        Left 3rd toe sore, warmth, no increased swelling or open wound  Neurological:  Negative for seizures, weakness and headaches.       Restless legs at night.   Psychiatric/Behavioral:  Positive for sleep disturbance. Negative for behavioral problems. The patient is nervous/anxious.     Immunization History  Administered Date(s) Administered   Influenza Split 08/11/2018, 08/03/2019   Influenza-Unspecified 09/01/2018, 09/10/2020, 09/18/2021, 09/21/2022   Moderna Sars-Covid-2 Vaccination 12/04/2019, 01/01/2020   Pfizer Covid-19 Vaccine Bivalent Booster 4yrs & up 10/01/2022   Pneumococcal Conjugate-13 05/29/2014   Pneumococcal Polysaccharide-23 06/14/2015   Tdap 09/14/2012   Zoster Recombinant(Shingrix) 02/10/2022, 05/11/2022   Zoster, Live 04/30/2004, 04/13/2018, 07/05/2018   Pertinent  Health Maintenance Due  Topic Date Due   DEXA SCAN  05/18/2024 (Originally 10/25/1997)   INFLUENZA VACCINE  07/01/2023      11/04/2020   12:00 PM 04/23/2021   11:34 AM 12/08/2022    9:52 AM 12/11/2022    2:50 PM 05/19/2023    9:23 AM  Fall Risk  Falls in the past year?   0 0 1  Was there an injury with Fall?   0 0 1  Fall Risk Category Calculator   0 0 2  Fall Risk Category (Retired)   Low Low   (RETIRED) Patient Fall Risk Level High fall risk Moderate fall risk Low fall risk Low fall risk   Patient at Risk for Falls Due to  No Fall Risks No Fall Risks History of fall(s);Impaired balance/gait;Impaired mobility  Fall risk Follow up   Falls evaluation completed Falls evaluation completed Falls evaluation completed   Functional Status Survey:    Vitals:   05/19/23 0927  BP: 124/68  Pulse: 78  Resp: 18  Temp: 97.6 F (36.4 C)  SpO2: 97%  Weight: 101 lb 3.2 oz (45.9 kg)  Height: 5' (1.524 m)   Body mass index is 19.76  kg/m. Physical Exam Constitutional:      Appearance: Normal appearance.  HENT:     Head: Normocephalic and atraumatic.     Mouth/Throat:     Mouth: Mucous membranes are moist.  Eyes:     Extraocular Movements: Extraocular movements intact.     Pupils: Pupils are equal, round, and reactive to light.  Cardiovascular:     Rate and Rhythm: Normal rate and regular rhythm.     Heart sounds: No murmur heard.    Comments: Weak left DP pulse, more symptomatic tingling sensation at night.  Pulmonary:     Effort: Pulmonary effort is normal.     Breath sounds: No rales.  Abdominal:     General: Bowel sounds are normal.     Palpations: Abdomen is soft.     Tenderness: There is no abdominal tenderness.  Musculoskeletal:        General: Tenderness present.     Cervical back: Normal range of motion and neck supple.     Right lower leg: Edema present.     Left lower leg: Edema present.     Comments: mild scoliosis. Hx of back surgeries x3 with left sciatica pain. s/p total left hip replacement. Trace edema BLE R SIJ pain, positional, palpated, still able to bear weight, walking  Skin:    General: Skin is warm and dry.     Findings: Erythema present.     Comments: Left 3rd toe sore, warmth, no increased swelling or open wound  Neurological:     General: No focal deficit present.     Mental Status: She is alert and oriented to person, place, and time. Mental status is at baseline.     Gait: Gait abnormal.     Comments: Using walker sometimes.   Psychiatric:        Mood and Affect: Mood normal.        Behavior: Behavior normal.        Thought Content: Thought content normal.        Judgment: Judgment normal.     Labs reviewed: Recent Labs    06/09/22 0000 07/02/22 0000  NA 134* 132*  K 4.4 4.6  CL 98* 99  CO2 26* 26*  BUN 16 19  CREATININE 0.8 0.7  CALCIUM 9.9 9.1   Recent Labs    07/02/22 0000  AST 19  ALT 12  ALKPHOS 43  ALBUMIN 3.8   Recent Labs    06/09/22 0000  07/02/22 0000  WBC 6.9 5.0  NEUTROABS 4,195.00 2,550.00  HGB 13.7 12.2  HCT 40 35*  PLT 384 420*   Lab Results  Component Value Date   TSH 1.30 07/02/2022   Lab Results  Component Value Date   HGBA1C 5.8 (H) 10/21/2020   Lab Results  Component Value Date   CHOL 171 06/20/2020   HDL 59 06/20/2020   LDLCALC 94 06/20/2020   TRIG 89 06/20/2020   CHOLHDL 2.9 06/20/2020    Significant Diagnostic Results in last 30 days:  No results found.  Assessment/Plan Osteoporosis Boniva, Ca, Vit D, declined DEXA, Vit D 16 10/07/21.   TIA (transient ischemic attack) off  Atorvastatin per patient's request, on ASA   Urinary frequency Urinary frequency, prolapsed bladder, desires tx, declined Myrbetriq 2/2 to the cost.     Osteoarthritis, multiple sites left hip, s/p total left hip, multiple sites, takes Tylenol, Aleve, Gabapentin for pain in toes(failed GDR to qod), Tramadol.  failed Methocarbamol recommended by Ortho-felt wobbly.   Slow transit constipation Stable,  takes MiraLax, Senokot S  GERD (gastroesophageal reflux disease) stable, Hgb 12.2 02/23/23  Insomnia secondary to anxiety not sleeping well, prn Lorazepam, started Lexapro 5mg  qd, failed Mirtazapine, desires no Ambien  Hyponatremia stable, Na 132 02/23/23  Hypothyroidism  resumed original dose of Levothyroxine since increased to 88/75mg  12/25/22 per her request since it made no difference in her chronic low energy, TSH 2.09 12/22/22  Seizures (HCC) saw neurology. Hospitalized 8/12-8/19 for seizure with presentation of left sided weakness again. EEG showed focal seizure right frontal region. Takes Keppra.  Restless leg syndrome  takes Gabapentin qod to avoid possible AR,  off Requip.  Venous insufficiency minimal swelling,  takes Furosemide, 06/20/20 EF 60-65%, Bun/creat 11/0.7 02/23/23     Family/ staff Communication: plan of care reviewed with the patient and charge nurse.   Labs/tests ordered:  none  Time  spend 40 minutes.

## 2023-05-19 NOTE — Assessment & Plan Note (Signed)
Boniva, Ca, Vit D, declined DEXA, Vit D 16 10/07/21.  

## 2023-05-19 NOTE — Assessment & Plan Note (Signed)
off  Atorvastatin per patient's request, on ASA  

## 2023-05-19 NOTE — Assessment & Plan Note (Addendum)
left hip, s/p total left hip, multiple sites, takes Tylenol, Aleve, Gabapentin for pain in toes(failed GDR to qod), Tramadol.  failed Methocarbamol recommended by Ortho-felt wobbly.  C/o R lower back pain, R SIJ pain palpated and positional, try Diclofenac topical and Lidoderm patch on 12hr and off 12hr, may X-ray if no better.

## 2023-05-19 NOTE — Assessment & Plan Note (Signed)
Urinary frequency, prolapsed bladder, desires tx, declined Myrbetriq 2/2 to the cost.

## 2023-05-19 NOTE — Assessment & Plan Note (Signed)
stable, Hgb 12.2 02/23/23 

## 2023-05-19 NOTE — Assessment & Plan Note (Signed)
not sleeping well, prn Lorazepam, started Lexapro 5mg  qd, failed Mirtazapine, desires no Ambien

## 2023-05-19 NOTE — Assessment & Plan Note (Signed)
resumed original dose of Levothyroxine since increased to 88/75mg 12/25/22 per her request since it made no difference in her chronic low energy, TSH 2.09 12/22/22 

## 2023-05-19 NOTE — Assessment & Plan Note (Signed)
saw neurology. Hospitalized 8/12-8/19 for seizure with presentation of left sided weakness again. EEG showed focal seizure right frontal region. Takes Keppra. 

## 2023-05-19 NOTE — Assessment & Plan Note (Signed)
takes Gabapentin qod to avoid possible AR,  off Requip. 

## 2023-07-09 ENCOUNTER — Encounter: Payer: Self-pay | Admitting: Nurse Practitioner

## 2023-07-09 ENCOUNTER — Encounter: Payer: Self-pay | Admitting: Family Medicine

## 2023-07-09 NOTE — Progress Notes (Unsigned)
Location:  [facility] Room Number: AL906/[id number] Place of Service:  ALF [phone]) Provider:  Aleayah Chico X, NP    Patient Care Team: Mahlon Gammon, MD as PCP - General (Internal Medicine)  Extended Emergency Contact Information Primary Emergency Contact: Avanell Shackleton States of [place] Home Phone: [phone] Mobile Phone: [phone] Relation: Friend Secondary Emergency Contact: Bradly Chris Address: [address]          [address]          [place], [place] [address] [place] of [place] Home Phone: [phone] Mobile Phone: [phone] Relation: Son  Code Status: DNR Goals of care: Advanced Directive information    07/09/2023   12:30 PM  Advanced Directives  Does Patient Have a Medical Advance Directive? Yes  Type of Estate agent of [place];Out of facility DNR (pink MOST or yellow form);Living will  Does patient want to make changes to medical advance directive? No - Patient declined  Copy of Healthcare Power of Attorney in Chart? Yes - validated most recent copy scanned in chart (See row information)  Pre-existing out of facility DNR order (yellow form or pink MOST form) Pink MOST form placed in chart (order not valid for inpatient use);Yellow form placed in chart (order not valid for inpatient use)     Chief Complaint  Patient presents with   Acute Visit    swelling BLE    HPI:  Pt is a 89 y.o. female seen today for an acute visit for    Past Medical History:  Diagnosis Date   Abdominal pain 02/26/2017   Anxiety    Arthritis    Left Knee, Wrist, Back and Hips   Cervical vertebral fusion    Diverticulitis    Diverticulosis    Fibromyalgia    GERD (gastroesophageal reflux disease)    Gout 02/2018   L hand   Hypertension     after d/c from hospital no current problems or medication   Hypothyroidism    Pelvis fracture (HCC)    Peritoneal free air 03/29/2012   Pneumonia    Pneumoperitoneum 02/26/2017    Pre-diabetes    Seizure (HCC)    Thyroid disorder    Toe pain 09/06/2016   Past Surgical History:  Procedure Laterality Date   ABDOMINAL HYSTERECTOMY     BOWEL RESECTION N/A 07/09/2017   Procedure: SMALL BOWEL RESECTION;  Surgeon: Luretha Murphy, MD;  Location: [facility];  Service: General;  Laterality: N/A;   CERVICAL FUSION     x2   CONVERSION TO TOTAL HIP Left 10/31/2020   Procedure: CONVERSION TO ANTERIOR TOTAL HIP;  Surgeon: Durene Romans, MD;  Location: [facility];  Service: Orthopedics;  Laterality: Left;  2 hrs   FEMUR IM NAIL Left 03/03/2020   Procedure: INTRAMEDULLARY (IM) NAIL FEMORAL;  Surgeon: Durene Romans, MD;  Location: [facility];  Service: Orthopedics;  Laterality: Left;   LAPAROSCOPIC APPENDECTOMY N/A 03/04/2017   Procedure: APPENDECTOMY LAPAROSCOPIC;  Surgeon: Luretha Murphy, MD;  Location: [facility];  Service: General;  Laterality: N/A;   LAPAROSCOPY N/A 03/04/2017   Procedure: LAPAROSCOPY DIAGNOSTIC, ENTEROLYSIS;  Surgeon: Luretha Murphy, MD;  Location: [facility];  Service: General;  Laterality: N/A;   LAPAROTOMY N/A 07/09/2017   Procedure: EXPLORATORY LAPAROTOMY;  Surgeon: Luretha Murphy, MD;  Location: [facility];  Service: General;  Laterality: N/A;   SPINE SURGERY     Lumbar- rod placement   TONSILLECTOMY     89y/o   TOTAL VAGINAL HYSTERECTOMY  Allergies  Allergen Reactions   Codeine Rash   Penicillins Rash    Has patient had a PCN reaction causing immediate rash, facial/tongue/throat swelling, SOB or lightheadedness with hypotension: No Has patient had a PCN reaction causing severe rash involving mucus membranes or skin necrosis: No Has patient had a PCN reaction that required hospitalization: No Has patient had a PCN reaction occurring within the last 10 years: No If all of the above answers are "NO", then may proceed with Cephalosporin use.  Tolerated Cephalosporin 10/31/20     Levaquin [Levofloxacin]     Outpatient Encounter Medications as of 07/09/2023  Medication Sig    acetaminophen (TYLENOL) 500 MG tablet Take 500 mg by mouth 2 (two) times daily as needed.   aspirin EC 81 MG tablet Take 81 mg by mouth daily. Swallow whole.   cyanocobalamin (VITAMIN B12) 1000 MCG tablet Take 1,000 mcg by mouth daily.   escitalopram (LEXAPRO) 5 MG tablet Take 5 mg by mouth daily.   furosemide (LASIX) 20 MG tablet Take 20 mg by mouth daily.   gabapentin (NEURONTIN) 100 MG capsule Take 1 capsule (100 mg total) by mouth at bedtime.   ibandronate (BONIVA) 150 MG tablet Take 150 mg by mouth every 30 (thirty) days. Take in the morning with a full glass of water, on an empty stomach, and do not take anything else by mouth or lie down for the next 30 min.   ibuprofen (ADVIL) 200 MG tablet Take 200 mg by mouth daily as needed.   levETIRAcetam (KEPPRA XR) 500 MG 24 hr tablet Take 1 tablet (500 mg total) by mouth daily.   levothyroxine (SYNTHROID) 75 MCG tablet Take 1 tablet (75 mcg total) by mouth daily before breakfast.   LORazepam (ATIVAN) 0.5 MG tablet Take 0.5 tablets (0.25 mg total) by mouth 2 (two) times daily as needed for anxiety.   Olopatadine HCl 0.7 % SOLN Apply to eye daily as needed (One drop to each eye).   polyethylene glycol (MIRALAX / GLYCOLAX) 17 g packet Take 17 g by mouth daily.    sennosides-docusate sodium (SENOKOT-S) 8.6-50 MG tablet Take 1 tablet by mouth at bedtime as needed for constipation.   sodium fluoride (FLUORISHIELD) 1.1 % GEL dental gel Place 1 Application onto teeth daily in the afternoon.   Vitamin D, Cholecalciferol, 50 MCG (2000 UT) CAPS Take 2,000 Units by mouth daily.    zolpidem (AMBIEN) 5 MG tablet Take 5 mg by mouth at bedtime as needed for sleep.   [DISCONTINUED] Calcium Carbonate (CALCIUM 500 PO) Take 500 mg by mouth daily.    [DISCONTINUED] traMADol (ULTRAM) 50 MG tablet Take 1 tablet (50 mg total) by mouth every 6 (six) hours as needed.   No facility-administered encounter medications on file as of 07/09/2023.    Review of  Systems  Immunization History  Administered Date(s) Administered   Influenza Split 08/11/2018, 08/03/2019   Influenza-Unspecified 09/01/2018, 09/10/2020, 09/18/2021, 09/21/2022   Moderna Sars-Covid-2 Vaccination 12/04/2019, 01/01/2020   Pfizer Covid-19 Vaccine Bivalent Booster 6yrs & up 10/01/2022   Pneumococcal Conjugate-13 05/29/2014   Pneumococcal Polysaccharide-23 06/14/2015   Tdap 09/14/2012   Zoster Recombinant(Shingrix) 02/10/2022, 05/11/2022   Zoster, Live 04/30/2004, 04/13/2018, 07/05/2018   Pertinent  Health Maintenance Due  Topic Date Due   INFLUENZA VACCINE  07/01/2023   DEXA SCAN  05/18/2024 (Originally 10/25/1997)      11/04/2020   12:00 PM 04/23/2021   11:34 AM 12/08/2022    9:52 AM 12/11/2022    2:50 PM  05/19/2023    9:23 AM  Fall Risk  Falls in the past year?   0 0 1  Was there an injury with Fall?   0 0 1  Fall Risk Category Calculator   0 0 2  Fall Risk Category (Retired)   Low Low   (RETIRED) Patient Fall Risk Level High fall risk Moderate fall risk Low fall risk Low fall risk   Patient at Risk for Falls Due to   No Fall Risks No Fall Risks History of fall(s);Impaired balance/gait;Impaired mobility  Fall risk Follow up   Falls evaluation completed Falls evaluation completed Falls evaluation completed   Functional Status Survey:    Vitals:   07/09/23 1222  BP: (!) 140/81  Pulse: 86  Resp: 20  Temp: (!) 97.2 F (36.2 C)  SpO2: 91%  Weight: 102 lb 6.4 oz (46.4 kg)  Height: 5' (1.524 m)   Body mass index is 20 kg/m. Physical Exam  Labs reviewed: No results for input(s): "NA", "K", "CL", "CO2", "GLUCOSE", "BUN", "CREATININE", "CALCIUM", "MG", "PHOS" in the last 8760 hours. No results for input(s): "AST", "ALT", "ALKPHOS", "BILITOT", "PROT", "ALBUMIN" in the last 8760 hours. No results for input(s): "WBC", "NEUTROABS", "HGB", "HCT", "MCV", "PLT" in the last 8760 hours. Lab Results  Component Value Date   TSH 1.30 07/02/2022   Lab Results   Component Value Date   HGBA1C 5.8 (H) 10/21/2020   Lab Results  Component Value Date   CHOL 171 06/20/2020   HDL 59 06/20/2020   LDLCALC 94 06/20/2020   TRIG 89 06/20/2020   CHOLHDL 2.9 06/20/2020    Significant Diagnostic Results in last 30 days:  No results found.  Assessment/Plan There are no diagnoses linked to this encounter.   Family/ staff Communication: ***  Labs/tests ordered:  ***

## 2023-07-12 ENCOUNTER — Encounter: Payer: Self-pay | Admitting: Family Medicine

## 2023-07-13 ENCOUNTER — Encounter: Payer: Self-pay | Admitting: Nurse Practitioner

## 2023-07-13 ENCOUNTER — Non-Acute Institutional Stay: Payer: Medicare Other | Admitting: Nurse Practitioner

## 2023-07-13 DIAGNOSIS — M150 Primary generalized (osteo)arthritis: Secondary | ICD-10-CM

## 2023-07-13 DIAGNOSIS — K5901 Slow transit constipation: Secondary | ICD-10-CM

## 2023-07-13 DIAGNOSIS — R569 Unspecified convulsions: Secondary | ICD-10-CM | POA: Diagnosis not present

## 2023-07-13 DIAGNOSIS — G2581 Restless legs syndrome: Secondary | ICD-10-CM | POA: Diagnosis not present

## 2023-07-13 DIAGNOSIS — E039 Hypothyroidism, unspecified: Secondary | ICD-10-CM | POA: Diagnosis not present

## 2023-07-13 DIAGNOSIS — K219 Gastro-esophageal reflux disease without esophagitis: Secondary | ICD-10-CM

## 2023-07-13 DIAGNOSIS — Z8673 Personal history of transient ischemic attack (TIA), and cerebral infarction without residual deficits: Secondary | ICD-10-CM

## 2023-07-13 DIAGNOSIS — F5105 Insomnia due to other mental disorder: Secondary | ICD-10-CM

## 2023-07-13 DIAGNOSIS — M159 Polyosteoarthritis, unspecified: Secondary | ICD-10-CM

## 2023-07-13 DIAGNOSIS — I10 Essential (primary) hypertension: Secondary | ICD-10-CM

## 2023-07-13 DIAGNOSIS — E871 Hypo-osmolality and hyponatremia: Secondary | ICD-10-CM

## 2023-07-13 DIAGNOSIS — R600 Localized edema: Secondary | ICD-10-CM | POA: Diagnosis not present

## 2023-07-13 DIAGNOSIS — R350 Frequency of micturition: Secondary | ICD-10-CM

## 2023-07-13 DIAGNOSIS — M8000XS Age-related osteoporosis with current pathological fracture, unspecified site, sequela: Secondary | ICD-10-CM

## 2023-07-13 DIAGNOSIS — F419 Anxiety disorder, unspecified: Secondary | ICD-10-CM

## 2023-07-13 NOTE — Assessment & Plan Note (Signed)
saw neurology, presentation of left sided weakness again. EEG showed focal seizure right frontal region. Takes Keppra since 07/23/20

## 2023-07-13 NOTE — Assessment & Plan Note (Signed)
stable, Hgb 12.2 02/23/23

## 2023-07-13 NOTE — Assessment & Plan Note (Signed)
takes Gabapentin qod to avoid possible AR,  off Requip. 

## 2023-07-13 NOTE — Assessment & Plan Note (Signed)
Urinary frequency, prolapsed bladder, chronic,  declined Myrbetriq 2/2 to the cost.

## 2023-07-13 NOTE — Assessment & Plan Note (Signed)
stable, Na 132 02/23/23 

## 2023-07-13 NOTE — Assessment & Plan Note (Signed)
Blood pressure is mildly elevated, increased Furosemide should help, monitor BP

## 2023-07-13 NOTE — Assessment & Plan Note (Signed)
x2, off  Atorvastatin per patient's request, on ASA

## 2023-07-13 NOTE — Assessment & Plan Note (Signed)
Boniva, Ca, Vit D, declined DEXA, Vit D 16 10/07/21.

## 2023-07-13 NOTE — Assessment & Plan Note (Signed)
not sleeping well, desires more frequency of prn Lorazepam-increase to 0.25mg  q8hr prn/q12 hrs prn, prn Ambien available to her, tolerated Lexapro 5mg  well,  failed Mirtazapine

## 2023-07-13 NOTE — Progress Notes (Addendum)
Location:  [facility] Room Number: [id number] Place of Service:  [phone]) Provider:  Chipper Oman, NP   Patient Care Team: Mahlon Gammon, MD as PCP - General (Internal Medicine)  Extended Emergency Contact Information Primary Emergency Contact: Avanell Shackleton [place] Home Phone: [phone] Mobile Phone: [phone] Relation: Friend Secondary Emergency Contact: Bradly Chris Address: [address]          [address]          [place], [place] [address] [place] of [place] Home Phone: [phone] Mobile Phone: [phone] Relation: Son  Code Status:  DNR Goals of care: Advanced Directive information    07/13/2023    8:33 AM  Advanced Directives  Does Patient Have a Medical Advance Directive? Yes  Type of Estate agent of [place];Out of facility DNR (pink MOST or yellow form);Living will  Does patient want to make changes to medical advance directive? No - Patient declined  Copy of Healthcare Power of Attorney in Chart? Yes - validated most recent copy scanned in chart (See row information)  Pre-existing out of facility DNR order (yellow form or pink MOST form) Pink MOST form placed in chart (order not valid for inpatient use);Yellow form placed in chart (order not valid for inpatient use)     Chief Complaint  Patient presents with   Acute Visit    Leg swelling     HPI:  Pt is a 89 y.o. female seen today for an acute visit for c/o  increased swelling in feet, shoes being tight,  feels more DOE, HR in 90s at rest, mild elevated Bp, denied PND, cough, phlegm production, chest pressure, chest pain. Sara Davila gained about #1Ib in the past month     Edema BLE, takes Furosemide, 06/20/20 EF 60-65%, Bun/creat 11/0.7 02/23/23             RLS takes Gabapentin qod to avoid possible AR,  off Requip.             Seizure, saw neurology, presentation of left sided weakness again. EEG showed focal seizure right frontal region.  Takes Keppra since 07/23/20.             Hypothyroidism, taking Levothyroxine, TSH 2.09 12/22/22             Hyponatremia, stable, Na 132 02/23/23             Anxiety, not sleeping well, prn Lorazepam, Ambien available to her, tolerated Lexapro 5mg  well,  failed Mirtazapine             GERD, stable, Hgb 12.2 02/23/23             Constipation, takes MiraLax, Senokot S             OA, left hip, s/p total left hip, multiple sites, takes Tylenol, Aleve, Gabapentin for pain in toes(failed GDR to qod), Tramadol.  failed Methocarbamol recommended by Ortho-felt wobbly.              OP Boniva, Ca, Vit D, declined DEXA, Vit D 16 10/07/21.              TIA, x2, off  Atorvastatin per patient's request, on ASA              Urinary frequency, prolapsed bladder, chronic,  declined Myrbetriq 2/2 to the cost.      Past Medical History:  Diagnosis Date   Abdominal pain 02/26/2017   Anxiety  Arthritis    Left Knee, Wrist, Back and Hips   Cervical vertebral fusion    Diverticulitis    Diverticulosis    Fibromyalgia    GERD (gastroesophageal reflux disease)    Gout 02/2018   L hand   Hypertension     after d/c from hospital no current problems or medication   Hypothyroidism    Pelvis fracture (HCC)    Peritoneal free air 03/29/2012   Pneumonia    Pneumoperitoneum 02/26/2017   Pre-diabetes    Seizure (HCC)    Thyroid disorder    Toe pain 09/06/2016   Past Surgical History:  Procedure Laterality Date   ABDOMINAL HYSTERECTOMY     BOWEL RESECTION N/A 07/09/2017   Procedure: SMALL BOWEL RESECTION;  Surgeon: Luretha Murphy, MD;  Location: [facility];  Service: General;  Laterality: N/A;   CERVICAL FUSION     x2   CONVERSION TO TOTAL HIP Left 10/31/2020   Procedure: CONVERSION TO ANTERIOR TOTAL HIP;  Surgeon: Durene Romans, MD;  Location: [facility];  Service: Orthopedics;  Laterality: Left;  2 hrs   FEMUR IM NAIL Left 03/03/2020   Procedure: INTRAMEDULLARY (IM) NAIL FEMORAL;  Surgeon: Durene Romans, MD;   Location: [facility];  Service: Orthopedics;  Laterality: Left;   LAPAROSCOPIC APPENDECTOMY N/A 03/04/2017   Procedure: APPENDECTOMY LAPAROSCOPIC;  Surgeon: Luretha Murphy, MD;  Location: [facility];  Service: General;  Laterality: N/A;   LAPAROSCOPY N/A 03/04/2017   Procedure: LAPAROSCOPY DIAGNOSTIC, ENTEROLYSIS;  Surgeon: Luretha Murphy, MD;  Location: [facility];  Service: General;  Laterality: N/A;   LAPAROTOMY N/A 07/09/2017   Procedure: EXPLORATORY LAPAROTOMY;  Surgeon: Luretha Murphy, MD;  Location: [facility];  Service: General;  Laterality: N/A;   SPINE SURGERY     Lumbar- rod placement   TONSILLECTOMY     89y/o   TOTAL VAGINAL HYSTERECTOMY      Allergies  Allergen Reactions   Codeine Rash   Penicillins Rash    Has patient had a PCN reaction causing immediate rash, facial/tongue/throat swelling, SOB or lightheadedness with hypotension: No Has patient had a PCN reaction causing severe rash involving mucus membranes or skin necrosis: No Has patient had a PCN reaction that required hospitalization: No Has patient had a PCN reaction occurring within the last 10 years: No If all of the above answers are "NO", then may proceed with Cephalosporin use.  Tolerated Cephalosporin 10/31/20     Levaquin [Levofloxacin]     Outpatient Encounter Medications as of 07/13/2023  Medication Sig   acetaminophen (TYLENOL) 500 MG tablet Take 500 mg by mouth 2 (two) times daily as needed.   aspirin EC 81 MG tablet Take 81 mg by mouth daily. Swallow whole.   cyanocobalamin (VITAMIN B12) 1000 MCG tablet Take 1,000 mcg by mouth daily.   escitalopram (LEXAPRO) 5 MG tablet Take 5 mg by mouth daily.   furosemide (LASIX) 20 MG tablet Take 20 mg by mouth daily.   gabapentin (NEURONTIN) 100 MG capsule Take 1 capsule (100 mg total) by mouth at bedtime.   ibandronate (BONIVA) 150 MG tablet Take 150 mg by mouth every 30 (thirty) days. Take in the morning with a full glass of water, on an empty stomach, and do not take anything  else by mouth or lie down for the next 30 min.   ibuprofen (ADVIL) 200 MG tablet Take 200 mg by mouth daily as needed.   levETIRAcetam (KEPPRA XR) 500 MG 24 hr tablet Take 1 tablet (500 mg total) by  mouth daily.   levothyroxine (SYNTHROID) 75 MCG tablet Take 1 tablet (75 mcg total) by mouth daily before breakfast.   LORazepam (ATIVAN) 0.5 MG tablet Take 0.5 tablets (0.25 mg total) by mouth 2 (two) times daily as needed for anxiety.   mupirocin ointment (BACTROBAN) 2 % Apply 1 Application topically as directed. Apply to irritated area topically as needed for irritation May keep at bedside to self administer twice daily as needed   Olopatadine HCl 0.7 % SOLN Apply to eye daily as needed (One drop to each eye).   Oyster Shell 500 MG TABS Take 1 tablet by mouth daily.   polyethylene glycol (MIRALAX / GLYCOLAX) 17 g packet Take 17 g by mouth daily as needed.   sennosides-docusate sodium (SENOKOT-S) 8.6-50 MG tablet Take 1-2 tablets by mouth at bedtime as needed for constipation.   sodium fluoride (FLUORISHIELD) 1.1 % GEL dental gel Place 1 Application onto teeth daily in the afternoon.   Vitamin D, Cholecalciferol, 50 MCG (2000 UT) CAPS Take 2,000 Units by mouth daily.    zolpidem (AMBIEN) 5 MG tablet Take 5 mg by mouth at bedtime as needed for sleep.   No facility-administered encounter medications on file as of 07/13/2023.    Review of Systems  Constitutional:  Negative for appetite change, fatigue and fever.  HENT:  Positive for hearing loss. Negative for congestion.   Eyes:  Negative for redness and visual disturbance.  Respiratory:  Positive for shortness of breath. Negative for cough and wheezing.        DOE  Cardiovascular:  Positive for leg swelling.  Gastrointestinal:  Negative for abdominal pain and constipation.  Genitourinary:  Positive for frequency. Negative for dysuria and urgency.       Bathroom trips 3-4x/night  Musculoskeletal:  Positive for arthralgias, back pain and gait  problem.       Left hip pain, s/p total left hip arthroplasty. Multiple sites aches in am, better as day goes by. Pain in toes.   Skin:        Left 3rd toe sore, warmth, no increased swelling or open wound  Neurological:  Negative for seizures, weakness and headaches.       Restless legs at night.   Psychiatric/Behavioral:  Positive for sleep disturbance. Negative for behavioral problems. The patient is nervous/anxious.     Immunization History  Administered Date(s) Administered   Influenza Split 08/11/2018, 08/03/2019   Influenza-Unspecified 09/01/2018, 09/10/2020, 09/18/2021, 09/21/2022   Moderna Sars-Covid-2 Vaccination 12/04/2019, 01/01/2020   Pfizer Covid-19 Vaccine Bivalent Booster 65yrs & up 10/01/2022   Pneumococcal Conjugate-13 05/29/2014   Pneumococcal Polysaccharide-23 06/14/2015   Tdap 09/14/2012   Zoster Recombinant(Shingrix) 02/10/2022, 05/11/2022   Zoster, Live 04/30/2004, 04/13/2018, 07/05/2018   Pertinent  Health Maintenance Due  Topic Date Due   INFLUENZA VACCINE  07/01/2023   DEXA SCAN  05/18/2024 (Originally 10/25/1997)      11/04/2020   12:00 PM 04/23/2021   11:34 AM 12/08/2022    9:52 AM 12/11/2022    2:50 PM 05/19/2023    9:23 AM  Fall Risk  Falls in the past year?   0 0 1  Was there an injury with Fall?   0 0 1  Fall Risk Category Calculator   0 0 2  Fall Risk Category (Retired)   Low Low   (RETIRED) Patient Fall Risk Level High fall risk Moderate fall risk Low fall risk Low fall risk   Patient at Risk for Falls Due to   No Fall  Risks No Fall Risks History of fall(s);Impaired balance/gait;Impaired mobility  Fall risk Follow up   Falls evaluation completed Falls evaluation completed Falls evaluation completed   Functional Status Survey:    Vitals:   07/13/23 0831  BP: (!) 155/89  Pulse: 91  SpO2: 96%  Weight: 102 lb 6.4 oz (46.4 kg)  Height: 5' (1.524 m)   Body mass index is 20 kg/m. Physical Exam Constitutional:      Appearance: Normal  appearance.  HENT:     Head: Normocephalic and atraumatic.     Mouth/Throat:     Mouth: Mucous membranes are moist.  Eyes:     Extraocular Movements: Extraocular movements intact.     Pupils: Pupils are equal, round, and reactive to light.  Cardiovascular:     Rate and Rhythm: Normal rate and regular rhythm.     Heart sounds: No murmur heard.    Comments: Weak left DP pulse, more symptomatic tingling sensation at night.  Pulmonary:     Effort: Pulmonary effort is normal.     Breath sounds: No rales.  Abdominal:     General: Bowel sounds are normal.     Palpations: Abdomen is soft.     Tenderness: There is no abdominal tenderness.  Musculoskeletal:        General: Tenderness present.     Cervical back: Normal range of motion and neck supple.     Right lower leg: Edema present.     Left lower leg: Edema present.     Comments: mild scoliosis. Hx of back surgeries x3 with left sciatica pain. s/p total left hip replacement. 1+ edema BLE R SIJ pain, positional, palpated, still able to bear weight, walking  Skin:    General: Skin is warm and dry.  Neurological:     General: No focal deficit present.     Mental Status: She is alert and oriented to person, place, and time. Mental status is at baseline.     Gait: Gait abnormal.     Comments: Using walker sometimes.   Psychiatric:        Mood and Affect: Mood normal.        Behavior: Behavior normal.        Thought Content: Thought content normal.        Judgment: Judgment normal.     Labs reviewed: Recent Labs    12/10/22 0000 02/23/23 0000  NA 132* 132*  K 4.6 4.7  CL 99 99  CO2 25* 28*  BUN 18* 11  CREATININE 0.7 0.7  CALCIUM 9.4 9.0   Recent Labs    12/10/22 0000 02/23/23 0000  AST 15 17  ALT 9 10  ALKPHOS 43 44  ALBUMIN 3.9 3.7   Recent Labs    12/10/22 0000 02/23/23 0000  WBC 5.0 3.9  NEUTROABS 2,875.00 1,860.00  HGB 11.7* 12.2  HCT 34* 36  PLT 394 365   Lab Results  Component Value Date   TSH  1.23 02/23/2023   Lab Results  Component Value Date   HGBA1C 5.8 (H) 10/21/2020   Lab Results  Component Value Date   CHOL 171 06/20/2020   HDL 59 06/20/2020   LDLCALC 94 06/20/2020   TRIG 89 06/20/2020   CHOLHDL 2.9 06/20/2020    Significant Diagnostic Results in last 30 days:  No results found.  Assessment/Plan Edema, peripheral c/o  increased swelling in feet, shoes being tight,  feels more DOE, HR in 90s at rest, mild elevated Bp, denied PND,  cough, phlegm production, chest pressure, chest pain. Weight gained about #1Ib in the past month.      Edema BLE, takes Furosemide, 06/20/20 EF 60-65%, Bun/creat 11/0.7 02/23/23   Increase Furosemide 20mg  every other day and 40mg  every other day alternative, BMP one week  Restless leg syndrome  takes Gabapentin qod to avoid possible AR,  off Requip.  Seizures (HCC) saw neurology, presentation of left sided weakness again. EEG showed focal seizure right frontal region. Takes Keppra since 07/23/20  Hypothyroidism  taking Levothyroxine, TSH 2.09 12/22/22  Hyponatremia stable, Na 132 02/23/23  Insomnia secondary to anxiety not sleeping well, desires more frequency of prn Lorazepam-increase to 0.25mg  q8hr prn/q12 hrs prn, prn Ambien available to her, tolerated Lexapro 5mg  well,  failed Mirtazapine  GERD (gastroesophageal reflux disease)  stable, Hgb 12.2 02/23/23  Slow transit constipation Stable, takes MiraLax, Senokot S  Osteoarthritis, multiple sites  left hip, s/p total left hip, multiple sites, takes Tylenol, Aleve, Gabapentin for pain in toes(failed GDR to qod), Tramadol.  failed Methocarbamol recommended by Ortho-felt wobbly.   Osteoporosis Boniva, Ca, Vit D, declined DEXA, Vit D 16 10/07/21.   History of TIA (transient ischemic attack)  x2, off  Atorvastatin per patient's request, on ASA   Urinary frequency Urinary frequency, prolapsed bladder, chronic,  declined Myrbetriq 2/2 to the cost.   HTN (hypertension),  benign Blood pressure is mildly elevated, increased Furosemide should help, monitor BP     Family/ staff Communication: plan of care reviewed with the patient and charge nurse.   Labs/tests ordered:  BMP  Time spend 40 minutes.

## 2023-07-13 NOTE — Assessment & Plan Note (Signed)
left hip, s/p total left hip, multiple sites, takes Tylenol, Aleve, Gabapentin for pain in toes(failed GDR to qod), Tramadol.  failed Methocarbamol recommended by Ortho-felt wobbly.

## 2023-07-13 NOTE — Assessment & Plan Note (Signed)
Stable, takes MiraLax, Senokot S 

## 2023-07-13 NOTE — Progress Notes (Signed)
This encounter was created in error - please disregard.

## 2023-07-13 NOTE — Assessment & Plan Note (Signed)
c/o  increased swelling in feet, shoes being tight,  feels more DOE, HR in 90s at rest, mild elevated Bp, denied PND, cough, phlegm production, chest pressure, chest pain. Weight gained about #1Ib in the past month.      Edema BLE, takes Furosemide, 06/20/20 EF 60-65%, Bun/creat 11/0.7 02/23/23   Increase Furosemide 20mg  every other day and 40mg  every other day alternative, BMP one week

## 2023-07-13 NOTE — Assessment & Plan Note (Signed)
taking Levothyroxine, TSH 2.09 12/22/22

## 2023-08-30 ENCOUNTER — Non-Acute Institutional Stay (SKILLED_NURSING_FACILITY): Payer: Medicare Other | Admitting: Sports Medicine

## 2023-08-30 ENCOUNTER — Encounter: Payer: Self-pay | Admitting: Sports Medicine

## 2023-08-30 DIAGNOSIS — F5101 Primary insomnia: Secondary | ICD-10-CM

## 2023-08-30 DIAGNOSIS — R5383 Other fatigue: Secondary | ICD-10-CM

## 2023-08-30 DIAGNOSIS — M7989 Other specified soft tissue disorders: Secondary | ICD-10-CM

## 2023-08-30 DIAGNOSIS — F411 Generalized anxiety disorder: Secondary | ICD-10-CM | POA: Diagnosis not present

## 2023-08-30 DIAGNOSIS — R569 Unspecified convulsions: Secondary | ICD-10-CM

## 2023-08-30 DIAGNOSIS — M810 Age-related osteoporosis without current pathological fracture: Secondary | ICD-10-CM

## 2023-08-30 NOTE — Progress Notes (Signed)
Provider:   Venita Sheffield MD Location:   [facility]   Place of Service:   Assisted living   PCP: Mahlon Gammon, MD Patient Care Team: Mahlon Gammon, MD as PCP - General (Internal Medicine)  Extended Emergency Contact Information Primary Emergency Contact: Avanell Shackleton [place] of [place] Home Phone: [phone] Mobile Phone: [phone] Relation: Friend Secondary Emergency Contact: Bradly Chris Address: [address]          [address]          [place], [place] [address] [place] Home Phone: [phone] Mobile Phone: [phone] Relation: Son  Code Status:  Goals of Care: Advanced Directive information    07/13/2023    8:33 AM  Advanced Directives  Does Patient Have a Medical Advance Directive? Yes  Type of Estate agent of [place];Out of facility DNR (pink MOST or yellow form);Living will  Does patient want to make changes to medical advance directive? No - Patient declined  Copy of Healthcare Power of Attorney in Chart? Yes - validated most recent copy scanned in chart (See row information)  Pre-existing out of facility DNR order (yellow form or pink MOST form) Pink MOST form placed in chart (order not valid for inpatient use);Yellow form placed in chart (order not valid for inpatient use)      No chief complaint on file.   HPI: Patient is a 89 y.o. female seen today for  routine visit for follow up on chronic problems. Having problems with her balance and hand writing  Ambulates with a rolator walker C/o pain in her lower back when tries to stand up straight  She exercises 4 times/ week - yoga, balance exercises Completed therapy  Tells me that she had a TIA couple of years ago She had seizure and takes a medication for her seizures  C/o exertional SOB  But no recent change Sleeps on 1 pillow Denies PND, lower extremity swelling   GAD  On ativan as needed  On lexapro   Insomnia Reports  problems going to sleep  Can able to go back to sleep after she wakes up to urinate    OA  States her arthritis is getting little worse  Urinary incontinence Does kegel exercises  Wears pads during the day and pull up during the night   Fatigue  Feels tired all the time    Past Medical History:  Diagnosis Date   Abdominal pain 02/26/2017   Anxiety    Arthritis    Left Knee, Wrist, Back and Hips   Cervical vertebral fusion    Diverticulitis    Diverticulosis    Fibromyalgia    GERD (gastroesophageal reflux disease)    Gout 02/2018   L hand   Hypertension     after d/c from hospital no current problems or medication   Hypothyroidism    Pelvis fracture (HCC)    Peritoneal free air 03/29/2012   Pneumonia    Pneumoperitoneum 02/26/2017   Pre-diabetes    Seizure (HCC)    Thyroid disorder    Toe pain 09/06/2016   Past Surgical History:  Procedure Laterality Date   ABDOMINAL HYSTERECTOMY     BOWEL RESECTION N/A 07/09/2017   Procedure: SMALL BOWEL RESECTION;  Surgeon: Luretha Murphy, MD;  Location: [facility];  Service: General;  Laterality: N/A;   CERVICAL FUSION     x2   CONVERSION TO TOTAL HIP Left 10/31/2020   Procedure: CONVERSION TO ANTERIOR TOTAL HIP;  Surgeon:  Durene Romans, MD;  Location: [facility];  Service: Orthopedics;  Laterality: Left;  2 hrs   FEMUR IM NAIL Left 03/03/2020   Procedure: INTRAMEDULLARY (IM) NAIL FEMORAL;  Surgeon: Durene Romans, MD;  Location: [facility];  Service: Orthopedics;  Laterality: Left;   LAPAROSCOPIC APPENDECTOMY N/A 03/04/2017   Procedure: APPENDECTOMY LAPAROSCOPIC;  Surgeon: Luretha Murphy, MD;  Location: [facility];  Service: General;  Laterality: N/A;   LAPAROSCOPY N/A 03/04/2017   Procedure: LAPAROSCOPY DIAGNOSTIC, ENTEROLYSIS;  Surgeon: Luretha Murphy, MD;  Location: [facility];  Service: General;  Laterality: N/A;   LAPAROTOMY N/A 07/09/2017   Procedure: EXPLORATORY LAPAROTOMY;  Surgeon: Luretha Murphy, MD;  Location: [facility];  Service: General;   Laterality: N/A;   SPINE SURGERY     Lumbar- rod placement   TONSILLECTOMY     89y/o   TOTAL VAGINAL HYSTERECTOMY      reports that she has quit smoking. She has never used smokeless tobacco. She reports that she does not currently use alcohol. She reports that she does not use drugs. Social History   Socioeconomic History   Marital status: Widowed    Spouse name: Not on file   Number of children: 2   Years of education: RN   Highest education level: Not on file  Occupational History   Occupation: Retired   Tobacco Use   Smoking status: Former   Smokeless tobacco: Never   Tobacco comments:    Smoked from age [age]  Vaping Use   Vaping status: Never Used  Substance and Sexual Activity   Alcohol use: Not Currently    Comment: occasional glass of wine   Drug use: No   Sexual activity: Not Currently  Other Topics Concern   Not on file  Social History Narrative   Lives at [facility] independent living    Caffeine use: occasional cup of coffee, 2-3 times a week   Left handed         Diet: Regular      Do you drink/ eat things with caffeine? No      Marital status: Widowed                              What year were you married ? 1986      Do you live in a house, apartment,assistred living, condo, trailer, etc.)? Independent Living Home       Is it one or more stories? 4 th floor      How many persons live in your home ? 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  Yes                            Type & how often 5-6 x WK      ADVANCED DIRECTIVES (Please bring copies)      Do you have a living will? Yes      Do you have a DNR form?   Yes                    If not, do you want to discuss one?       Do you have  signed POA?HPOA forms?   Yes              If so, please bring to your appointment      FUNCTIONAL STATUS- To  be completed by Spouse / child / Staff       Do you have difficulty bathing or dressing yourself ? No      Do you have difficulty preparing food or eating ?  No      Do you have difficulty managing your mediation ?No      Do you have difficulty managing your finances ? No      Do you have difficulty affording your medication ? No      Social Determinants of Health   Financial Resource Strain: Not on file  Food Insecurity: Not on file  Transportation Needs: Not on file  Physical Activity: Not on file  Stress: Not on file  Social Connections: Not on file  Intimate Partner Violence: Not on file    Functional Status Survey:    Family History  Problem Relation Age of Onset   Asthma Mother    Pulmonary embolism Mother    Macular degeneration Mother    Heart disease Father    Stroke Father    Stroke Paternal Uncle    Breast cancer Maternal Aunt    Macular degeneration Sister    Stroke Sister    Neuropathy Neg Hx     Health Maintenance  Topic Date Due   DTaP/Tdap/Td (2 - Td or Tdap) 09/14/2022   INFLUENZA VACCINE  07/01/2023   COVID-19 Vaccine (4 - 2023-24 season) 08/01/2023   DEXA SCAN  05/18/2024 (Originally 10/25/1997)   Medicare Annual Wellness (AWV)  12/12/2023   Pneumonia Vaccine 43+ Years old  Completed   Zoster Vaccines- Shingrix  Completed   HPV VACCINES  Aged Out    Allergies  Allergen Reactions   Codeine Rash   Penicillins Rash    Has patient had a PCN reaction causing immediate rash, facial/tongue/throat swelling, SOB or lightheadedness with hypotension: No Has patient had a PCN reaction causing severe rash involving mucus membranes or skin necrosis: No Has patient had a PCN reaction that required hospitalization: No Has patient had a PCN reaction occurring within the last 10 years: No If all of the above answers are "NO", then may proceed with Cephalosporin use.  Tolerated Cephalosporin 10/31/20     Levaquin [Levofloxacin]     Outpatient  Encounter Medications as of 08/30/2023  Medication Sig   acetaminophen (TYLENOL) 500 MG tablet Take 500 mg by mouth 2 (two) times daily as needed.   aspirin EC 81 MG tablet Take 81 mg by mouth daily. Swallow whole.   cyanocobalamin (VITAMIN B12) 1000 MCG tablet Take 1,000 mcg by mouth daily.   escitalopram (LEXAPRO) 5 MG tablet Take 5 mg by mouth daily.   furosemide (LASIX) 20 MG tablet Take 20 mg by mouth daily.   gabapentin (NEURONTIN) 100 MG capsule Take 1 capsule (100 mg total) by mouth at bedtime.   ibandronate (BONIVA) 150 MG tablet Take 150 mg by mouth every 30 (thirty) days. Take in the morning with a full glass of water, on an empty stomach, and do not take anything else by mouth or lie down for the next 30 min.   ibuprofen (ADVIL) 200 MG tablet Take 200 mg by mouth daily as needed.   levETIRAcetam (KEPPRA XR) 500 MG 24 hr tablet Take 1 tablet (500 mg total) by mouth daily.  levothyroxine (SYNTHROID) 75 MCG tablet Take 1 tablet (75 mcg total) by mouth daily before breakfast.   LORazepam (ATIVAN) 0.5 MG tablet Take 0.5 tablets (0.25 mg total) by mouth 2 (two) times daily as needed for anxiety.   mupirocin ointment (BACTROBAN) 2 % Apply 1 Application topically as directed. Apply to irritated area topically as needed for irritation May keep at bedside to self administer twice daily as needed   Olopatadine HCl 0.7 % SOLN Apply to eye daily as needed (One drop to each eye).   Oyster Shell 500 MG TABS Take 1 tablet by mouth daily.   polyethylene glycol (MIRALAX / GLYCOLAX) 17 g packet Take 17 g by mouth daily as needed.   sennosides-docusate sodium (SENOKOT-S) 8.6-50 MG tablet Take 1-2 tablets by mouth at bedtime as needed for constipation.   sodium fluoride (FLUORISHIELD) 1.1 % GEL dental gel Place 1 Application onto teeth daily in the afternoon.   Vitamin D, Cholecalciferol, 50 MCG (2000 UT) CAPS Take 2,000 Units by mouth daily.    zolpidem (AMBIEN) 5 MG tablet Take 5 mg by mouth at  bedtime as needed for sleep.   No facility-administered encounter medications on file as of 08/30/2023.    Review of Systems  Constitutional:  Positive for fatigue. Negative for chills and fever.  HENT:  Negative for sinus pressure and sore throat.   Respiratory:  Negative for cough, shortness of breath and wheezing.   Cardiovascular:  Negative for chest pain, palpitations and leg swelling.  Gastrointestinal:  Negative for abdominal distention, abdominal pain, blood in stool, constipation, diarrhea, nausea and vomiting.  Genitourinary:  Negative for dysuria, frequency and urgency.  Musculoskeletal:  Positive for arthralgias.  Psychiatric/Behavioral:  Positive for sleep disturbance. The patient is nervous/anxious.     There were no vitals filed for this visit. There is no height or weight on file to calculate BMI. Physical Exam Constitutional:      Appearance: Normal appearance.  HENT:     Head: Normocephalic and atraumatic.  Cardiovascular:     Rate and Rhythm: Normal rate and regular rhythm.     Heart sounds: No murmur heard. Pulmonary:     Effort: Pulmonary effort is normal. No respiratory distress.     Breath sounds: Normal breath sounds. No wheezing.  Abdominal:     General: Bowel sounds are normal. There is no distension.     Tenderness: There is no abdominal tenderness. There is no guarding or rebound.  Musculoskeletal:        General: No swelling or tenderness.  Skin:    General: Skin is dry.  Neurological:     Mental Status: She is alert. Mental status is at baseline.     Sensory: No sensory deficit.     Motor: No weakness.     Labs reviewed: Basic Metabolic Panel: Recent Labs    12/10/22 0000 02/23/23 0000  NA 132* 132*  K 4.6 4.7  CL 99 99  CO2 25* 28*  BUN 18* 11  CREATININE 0.7 0.7  CALCIUM 9.4 9.0   Liver Function Tests: Recent Labs    12/10/22 0000 02/23/23 0000  AST 15 17  ALT 9 10  ALKPHOS 43 44  ALBUMIN 3.9 3.7   No results for  input(s): "LIPASE", "AMYLASE" in the last 8760 hours. No results for input(s): "AMMONIA" in the last 8760 hours. CBC: Recent Labs    12/10/22 0000 02/23/23 0000  WBC 5.0 3.9  NEUTROABS 2,875.00 1,860.00  HGB 11.7* 12.2  HCT  34* 36  PLT 394 365   Cardiac Enzymes: No results for input(s): "CKTOTAL", "CKMB", "CKMBINDEX", "TROPONINI" in the last 8760 hours. BNP: Invalid input(s): "POCBNP" Lab Results  Component Value Date   HGBA1C 5.8 (H) 10/21/2020   Lab Results  Component Value Date   TSH 1.23 02/23/2023   Lab Results  Component Value Date   VITAMINB12 818 03/25/2023   Lab Results  Component Value Date   FOLATE 19.8 12/01/2017   No results found for: "IRON", "TIBC", "FERRITIN"  Imaging and Procedures obtained prior to SNF admission: CT ABDOMEN PELVIS WO CONTRAST  Result Date: 04/23/2021 CLINICAL DATA:  89 year old female with abdominal distension and pain. EXAM: CT ABDOMEN AND PELVIS WITHOUT CONTRAST TECHNIQUE: Multidetector CT imaging of the abdomen and pelvis was performed following the standard protocol without IV contrast. COMPARISON:  CT abdomen pelvis dated 02/26/2017. FINDINGS: Evaluation of this exam is limited in the absence of intravenous contrast. Evaluation is also limited due to streak artifact caused by spinal hardware. Lower chest: Bibasilar linear atelectasis/scarring. The visualized lung bases are otherwise clear. No intra-abdominal free air or free fluid. Hepatobiliary: Several small liver cysts. No intrahepatic biliary ductal dilatation. The gallbladder is unremarkable. Pancreas: Unremarkable. No pancreatic ductal dilatation or surrounding inflammatory changes. Spleen: Normal in size without focal abnormality. Adrenals/Urinary Tract: The adrenal glands are unremarkable. There is no hydronephrosis or nephrolithiasis on either side. The visualized ureters appear unremarkable. The urinary bladder is distended and grossly unremarkable. Stomach/Bowel: Large duodenal  diverticula measure up to 5 cm. No definite inflammatory changes. There is moderate stool throughout the colon. There is sigmoid diverticulosis without active inflammatory changes. There is no bowel obstruction. Appendectomy. Vascular/Lymphatic: Mild aortoiliac atherosclerotic disease. The IVC is unremarkable. No portal venous gas. There is no adenopathy. Reproductive: Hysterectomy. No adnexal masses. Other: None Musculoskeletal: Osteopenia with scoliosis and degenerative changes of the spine. Extensive lumbar disc spacers and posterior fusion hardware. The hardware appears intact. There is no acute fracture. Total left hip arthroplasty. IMPRESSION: 1. No acute intra-abdominal or pelvic pathology. 2. Moderate colonic stool burden. No bowel obstruction. 3. Duodenal and sigmoid diverticulosis. 4. Aortic Atherosclerosis (ICD10-I70.0). Electronically Signed   By: Elgie Collard M.D.   On: 04/23/2021 14:59    Assessment/Plan  1. GAD (generalized anxiety disorder) Pt reports that she takes ativan 5 times/ week  Will increase lexapro to 20 mg and gradually wean ativan  2. Primary insomnia Discussed regarding sleep hygiene Will start melatonin  Avoid day time naps  3. Other fatigue Will get cbc, bmp TSH, b12, vit d levels  4. Swelling of lower extremity Non pitting oedema Lungs clear   Osteoporosis Cont with boniva, calcium, vitamin D Will check vit d levels  Seizures Cont with keppra   Family/ staff Communication: care plan discussed with the nurse  Labs/tests ordered: cbc, cmp, TSH, b12, vit d

## 2023-09-02 LAB — BASIC METABOLIC PANEL
BUN: 15 (ref 4–21)
CO2: 26 — AB (ref 13–22)
Chloride: 99 (ref 99–108)
Creatinine: 0.6 (ref 0.5–1.1)
Glucose: 81
Potassium: 4.5 meq/L (ref 3.5–5.1)
Sodium: 133 — AB (ref 137–147)

## 2023-09-02 LAB — CBC AND DIFFERENTIAL
HCT: 35 — AB (ref 36–46)
Hemoglobin: 11.6 — AB (ref 12.0–16.0)
Neutrophils Absolute: 2571
Platelets: 383 10*3/uL (ref 150–400)
WBC: 4.7

## 2023-09-02 LAB — CBC: RBC: 3.61 — AB (ref 3.87–5.11)

## 2023-09-02 LAB — COMPREHENSIVE METABOLIC PANEL
Albumin: 3.5 (ref 3.5–5.0)
Calcium: 9.1 (ref 8.7–10.7)
Globulin: 2.1

## 2023-09-02 LAB — TSH: TSH: 1 (ref 0.41–5.90)

## 2023-09-02 LAB — VITAMIN D 25 HYDROXY (VIT D DEFICIENCY, FRACTURES): Vit D, 25-Hydroxy: 70

## 2023-09-02 LAB — VITAMIN B12: Vitamin B-12: 1034

## 2023-11-22 ENCOUNTER — Encounter: Payer: Self-pay | Admitting: Nurse Practitioner

## 2023-11-22 ENCOUNTER — Non-Acute Institutional Stay: Payer: Medicare Other | Admitting: Nurse Practitioner

## 2023-11-22 DIAGNOSIS — G2581 Restless legs syndrome: Secondary | ICD-10-CM

## 2023-11-22 DIAGNOSIS — E039 Hypothyroidism, unspecified: Secondary | ICD-10-CM

## 2023-11-22 DIAGNOSIS — R569 Unspecified convulsions: Secondary | ICD-10-CM | POA: Diagnosis not present

## 2023-11-22 DIAGNOSIS — I872 Venous insufficiency (chronic) (peripheral): Secondary | ICD-10-CM

## 2023-11-22 DIAGNOSIS — F5105 Insomnia due to other mental disorder: Secondary | ICD-10-CM

## 2023-11-22 DIAGNOSIS — K5901 Slow transit constipation: Secondary | ICD-10-CM

## 2023-11-22 DIAGNOSIS — M8000XS Age-related osteoporosis with current pathological fracture, unspecified site, sequela: Secondary | ICD-10-CM

## 2023-11-22 DIAGNOSIS — I10 Essential (primary) hypertension: Secondary | ICD-10-CM | POA: Diagnosis not present

## 2023-11-22 DIAGNOSIS — E871 Hypo-osmolality and hyponatremia: Secondary | ICD-10-CM

## 2023-11-22 DIAGNOSIS — R350 Frequency of micturition: Secondary | ICD-10-CM

## 2023-11-22 DIAGNOSIS — K219 Gastro-esophageal reflux disease without esophagitis: Secondary | ICD-10-CM

## 2023-11-22 DIAGNOSIS — G459 Transient cerebral ischemic attack, unspecified: Secondary | ICD-10-CM

## 2023-11-22 DIAGNOSIS — M150 Primary generalized (osteo)arthritis: Secondary | ICD-10-CM

## 2023-11-22 DIAGNOSIS — F419 Anxiety disorder, unspecified: Secondary | ICD-10-CM

## 2023-11-22 NOTE — Assessment & Plan Note (Signed)
x2, off  Atorvastatin per patient's request, on ASA

## 2023-11-22 NOTE — Assessment & Plan Note (Addendum)
taking Levothyroxine, TSH 1 09/02/23

## 2023-11-22 NOTE — Assessment & Plan Note (Signed)
takes Gabapentin, off Requip. Vit B12 818 03/25/23(taking Vit B12)

## 2023-11-22 NOTE — Assessment & Plan Note (Signed)
Blood pressure is controlled, only taking Furosemide.

## 2023-11-22 NOTE — Assessment & Plan Note (Addendum)
stable, Na 133 09/02/23

## 2023-11-22 NOTE — Assessment & Plan Note (Addendum)
stable, Hgb 11.6 09/02/23

## 2023-11-22 NOTE — Assessment & Plan Note (Addendum)
takes Furosemide, 06/20/20 EF 60-65%, Bun/creat 15/0.59 09/02/23

## 2023-11-22 NOTE — Progress Notes (Signed)
Location:   [facility] Room Number: 816 Place of Service:  ALF (13) Provider: Arna Snipe Sander Speckman NP  Venita Sheffield, MD  Patient Care Team: Venita Sheffield, MD as PCP - General (Internal Medicine)  Extended Emergency Contact Information Primary Emergency Contact: Avanell Shackleton [place] Home Phone: [phone] Mobile Phone: [phone] Relation: Friend Secondary Emergency Contact: Bradly Chris Address: [address]          [address]          [place], [place] [address] [place] of [place] Home Phone: [phone] Mobile Phone: [phone] Relation: Son  Code Status:  DNR Goals of care: Advanced Directive information    07/13/2023    8:33 AM  Advanced Directives  Does Patient Have a Medical Advance Directive? Yes  Type of Estate agent of [place];Out of facility DNR (pink MOST or yellow form);Living will  Does patient want to make changes to medical advance directive? No - Patient declined  Copy of Healthcare Power of Attorney in Chart? Yes - validated most recent copy scanned in chart (See row information)  Pre-existing out of facility DNR order (yellow form or pink MOST form) Pink MOST form placed in chart (order not valid for inpatient use);Yellow form placed in chart (order not valid for inpatient use)     Chief Complaint  Patient presents with   Medical Management of Chronic Issues    HPI:  Pt is a 89 y.o. female seen today for medical management of chronic diseases.     Edema BLE, takes Furosemide, 06/20/20 EF 60-65%, Bun/creat 15/0.59 09/02/23             RLS takes Gabapentin, off Requip. Vit B12 818 03/25/23(taking Vit B12)             Seizure, saw neurology, presentation of left sided weakness again. EEG showed focal seizure right frontal region. Takes Keppra since 07/23/20.             Hypothyroidism, taking Levothyroxine, TSH 1 09/02/23             Hyponatremia, stable, Na 133 09/02/23              Anxiety, not sleeping well, prn Lorazepam, Ambien available to her, failed Mirtazapine, Lexapro             GERD, stable, Hgb 11.6 09/02/23             Constipation, takes MiraLax, Senokot S             OA, left hip, s/p total left hip, multiple sites, takes Tylenol, Aleve, Gabapentin for pain in toes, failed Methocarbamol recommended by Ortho-felt wobbly.              OP Boniva, Ca, Vit D, declined DEXA, Vit D 16 10/07/21.              TIA, x2, off  Atorvastatin per patient's request, on ASA              Urinary frequency, prolapsed bladder, chronic,  declined Myrbetriq 2/2 to the cost.    Past Medical History:  Diagnosis Date   Abdominal pain 02/26/2017   Anxiety    Arthritis    Left Knee, Wrist, Back and Hips   Cervical vertebral fusion    Diverticulitis    Diverticulosis    Fibromyalgia    GERD (gastroesophageal reflux disease)    Gout 02/2018   L hand   Hypertension  after d/c from hospital no current problems or medication   Hypothyroidism    Pelvis fracture (HCC)    Peritoneal free air 03/29/2012   Pneumonia    Pneumoperitoneum 02/26/2017   Pre-diabetes    Seizure (HCC)    Thyroid disorder    Toe pain 09/06/2016   Past Surgical History:  Procedure Laterality Date   ABDOMINAL HYSTERECTOMY     BOWEL RESECTION N/A 07/09/2017   Procedure: SMALL BOWEL RESECTION;  Surgeon: Luretha Murphy, MD;  Location: [facility];  Service: General;  Laterality: N/A;   CERVICAL FUSION     x2   CONVERSION TO TOTAL HIP Left 10/31/2020   Procedure: CONVERSION TO ANTERIOR TOTAL HIP;  Surgeon: Durene Romans, MD;  Location: [facility];  Service: Orthopedics;  Laterality: Left;  2 hrs   FEMUR IM NAIL Left 03/03/2020   Procedure: INTRAMEDULLARY (IM) NAIL FEMORAL;  Surgeon: Durene Romans, MD;  Location: [facility];  Service: Orthopedics;  Laterality: Left;   LAPAROSCOPIC APPENDECTOMY N/A 03/04/2017   Procedure: APPENDECTOMY LAPAROSCOPIC;  Surgeon: Luretha Murphy, MD;  Location: [facility];  Service: General;   Laterality: N/A;   LAPAROSCOPY N/A 03/04/2017   Procedure: LAPAROSCOPY DIAGNOSTIC, ENTEROLYSIS;  Surgeon: Luretha Murphy, MD;  Location: [facility];  Service: General;  Laterality: N/A;   LAPAROTOMY N/A 07/09/2017   Procedure: EXPLORATORY LAPAROTOMY;  Surgeon: Luretha Murphy, MD;  Location: [facility];  Service: General;  Laterality: N/A;   SPINE SURGERY     Lumbar- rod placement   TONSILLECTOMY     89y/o   TOTAL VAGINAL HYSTERECTOMY      Allergies  Allergen Reactions   Codeine Rash   Penicillins Rash    Has patient had a PCN reaction causing immediate rash, facial/tongue/throat swelling, SOB or lightheadedness with hypotension: No Has patient had a PCN reaction causing severe rash involving mucus membranes or skin necrosis: No Has patient had a PCN reaction that required hospitalization: No Has patient had a PCN reaction occurring within the last 10 years: No If all of the above answers are "NO", then may proceed with Cephalosporin use.  Tolerated Cephalosporin 10/31/20     Levaquin [Levofloxacin]     Allergies as of 11/22/2023       Reactions   Codeine Rash   Penicillins Rash   Has patient had a PCN reaction causing immediate rash, facial/tongue/throat swelling, SOB or lightheadedness with hypotension: No Has patient had a PCN reaction causing severe rash involving mucus membranes or skin necrosis: No Has patient had a PCN reaction that required hospitalization: No Has patient had a PCN reaction occurring within the last 10 years: No If all of the above answers are "NO", then may proceed with Cephalosporin use. Tolerated Cephalosporin 10/31/20   Levaquin [levofloxacin]         Medication List        Accurate as of November 22, 2023 11:59 PM. If you have any questions, ask your nurse or doctor.          acetaminophen 500 MG tablet Commonly known as: TYLENOL Take 500 mg by mouth 2 (two) times daily as needed.   aspirin EC 81 MG tablet Take 81 mg by mouth daily. Swallow  whole.   cyanocobalamin 1000 MCG tablet Commonly known as: VITAMIN B12 Take 1,000 mcg by mouth daily.   escitalopram 5 MG tablet Commonly known as: LEXAPRO Take 5 mg by mouth daily.   furosemide 20 MG tablet Commonly known as: LASIX Take 20 mg by mouth daily.  gabapentin 100 MG capsule Commonly known as: NEURONTIN Take 1 capsule (100 mg total) by mouth at bedtime.   ibandronate 150 MG tablet Commonly known as: BONIVA Take 150 mg by mouth every 30 (thirty) days. Take in the morning with a full glass of water, on an empty stomach, and do not take anything else by mouth or lie down for the next 30 min.   ibuprofen 200 MG tablet Commonly known as: ADVIL Take 200 mg by mouth daily as needed.   levETIRAcetam 500 MG 24 hr tablet Commonly known as: Keppra XR Take 1 tablet (500 mg total) by mouth daily.   levothyroxine 75 MCG tablet Commonly known as: SYNTHROID Take 1 tablet (75 mcg total) by mouth daily before breakfast.   LORazepam 0.5 MG tablet Commonly known as: Ativan Take 0.5 tablets (0.25 mg total) by mouth 2 (two) times daily as needed for anxiety.   mupirocin ointment 2 % Commonly known as: BACTROBAN Apply 1 Application topically as directed. Apply to irritated area topically as needed for irritation May keep at bedside to self administer twice daily as needed   Olopatadine HCl 0.7 % Soln Apply to eye daily as needed (One drop to each eye).   Oyster Shell 500 MG Tabs Take 1 tablet by mouth daily.   polyethylene glycol 17 g packet Commonly known as: MIRALAX / GLYCOLAX Take 17 g by mouth daily as needed.   sennosides-docusate sodium 8.6-50 MG tablet Commonly known as: SENOKOT-S Take 1-2 tablets by mouth at bedtime as needed for constipation.   sodium fluoride 1.1 % Gel dental gel Commonly known as: FLUORISHIELD Place 1 Application onto teeth daily in the afternoon.   vitamin D3 50 MCG (2000 UT) Caps Take 2,000 Units by mouth daily.   zolpidem 5 MG  tablet Commonly known as: AMBIEN Take 5 mg by mouth at bedtime as needed for sleep.        Review of Systems  Constitutional:  Negative for appetite change, fatigue and fever.  HENT:  Positive for hearing loss. Negative for congestion.   Eyes:  Negative for redness and visual disturbance.  Respiratory:  Positive for shortness of breath. Negative for cough and wheezing.        DOE  Cardiovascular:  Positive for leg swelling.  Gastrointestinal:  Negative for abdominal pain and constipation.  Genitourinary:  Positive for frequency. Negative for dysuria and urgency.       Bathroom trips 3-4x/night  Musculoskeletal:  Positive for arthralgias, back pain and gait problem.       Left hip pain, s/p total left hip arthroplasty. Multiple sites aches in am, better as day goes by. Pain in toes.  Chronic back pain  Skin:        Left 3rd toe sore, warmth, no increased swelling or open wound  Neurological:  Negative for seizures, weakness and headaches.       Restless legs at night.  Memory lapses.   Psychiatric/Behavioral:  Positive for sleep disturbance. Negative for behavioral problems. The patient is nervous/anxious.     Immunization History  Administered Date(s) Administered   Influenza Split 08/11/2018, 08/03/2019   Influenza-Unspecified 09/01/2018, 09/10/2020, 09/18/2021, 09/21/2022   Moderna Sars-Covid-2 Vaccination 12/04/2019, 01/01/2020   Pfizer Covid-19 Vaccine Bivalent Booster 55yrs & up 10/01/2022   Pneumococcal Conjugate-13 05/29/2014   Pneumococcal Polysaccharide-23 06/14/2015   Tdap 09/14/2012   Zoster Recombinant(Shingrix) 02/10/2022, 05/11/2022   Zoster, Live 04/30/2004, 04/13/2018, 07/05/2018   Pertinent  Health Maintenance Due  Topic Date Due  INFLUENZA VACCINE  07/01/2023   DEXA SCAN  05/18/2024 (Originally 10/25/1997)      11/04/2020   12:00 PM 04/23/2021   11:34 AM 12/08/2022    9:52 AM 12/11/2022    2:50 PM 05/19/2023    9:23 AM  Fall Risk  Falls in the past  year?   0 0 1  Was there an injury with Fall?   0 0 1  Fall Risk Category Calculator   0 0 2  Fall Risk Category (Retired)   Low Low   (RETIRED) Patient Fall Risk Level High fall risk Moderate fall risk Low fall risk Low fall risk   Patient at Risk for Falls Due to   No Fall Risks No Fall Risks History of fall(s);Impaired balance/gait;Impaired mobility  Fall risk Follow up   Falls evaluation completed Falls evaluation completed Falls evaluation completed   Functional Status Survey:    Vitals:   11/22/23 1118  BP: 106/68  Pulse: 95  Resp: 18  Temp: (!) 97.2 F (36.2 C)  SpO2: 95%  Weight: 101 lb 3.2 oz (45.9 kg)   Body mass index is 19.76 kg/m. Physical Exam Constitutional:      Appearance: Normal appearance.  HENT:     Head: Normocephalic and atraumatic.     Mouth/Throat:     Mouth: Mucous membranes are moist.  Eyes:     Extraocular Movements: Extraocular movements intact.     Pupils: Pupils are equal, round, and reactive to light.  Cardiovascular:     Rate and Rhythm: Normal rate and regular rhythm.     Heart sounds: No murmur heard.    Comments: Weak left DP pulse, more symptomatic tingling sensation at night.  Pulmonary:     Effort: Pulmonary effort is normal.     Breath sounds: No rales.  Abdominal:     General: Bowel sounds are normal.     Palpations: Abdomen is soft.     Tenderness: There is no abdominal tenderness.  Musculoskeletal:        General: Tenderness present.     Cervical back: Normal range of motion and neck supple.     Right lower leg: Edema present.     Left lower leg: Edema present.     Comments: mild scoliosis. Hx of back surgeries x3 with left sciatica pain. s/p total left hip replacement. 1+ edema BLE R SIJ pain, positional, palpated, still able to bear weight, walking  Skin:    General: Skin is warm and dry.  Neurological:     General: No focal deficit present.     Mental Status: She is alert and oriented to person, place, and time.  Mental status is at baseline.     Gait: Gait abnormal.     Comments: Using walker sometimes.   Psychiatric:        Mood and Affect: Mood normal.        Behavior: Behavior normal.        Thought Content: Thought content normal.        Judgment: Judgment normal.     Labs reviewed: Recent Labs    12/10/22 0000 02/23/23 0000  NA 132* 132*  K 4.6 4.7  CL 99 99  CO2 25* 28*  BUN 18* 11  CREATININE 0.7 0.7  CALCIUM 9.4 9.0   Recent Labs    12/10/22 0000 02/23/23 0000  AST 15 17  ALT 9 10  ALKPHOS 43 44  ALBUMIN 3.9 3.7   Recent Labs  12/10/22 0000 02/23/23 0000  WBC 5.0 3.9  NEUTROABS 2,875.00 1,860.00  HGB 11.7* 12.2  HCT 34* 36  PLT 394 365   Lab Results  Component Value Date   TSH 1.23 02/23/2023   Lab Results  Component Value Date   HGBA1C 5.8 (H) 10/21/2020   Lab Results  Component Value Date   CHOL 171 06/20/2020   HDL 59 06/20/2020   LDLCALC 94 06/20/2020   TRIG 89 06/20/2020   CHOLHDL 2.9 06/20/2020    Significant Diagnostic Results in last 30 days:  No results found.  Assessment/Plan  HTN (hypertension), benign Blood pressure is controlled, only taking Furosemide.   Venous insufficiency takes Furosemide, 06/20/20 EF 60-65%, Bun/creat 15/0.59 09/02/23  Restless leg syndrome  takes Gabapentin, off Requip. Vit B12 818 03/25/23(taking Vit B12)  Seizures (HCC)  saw neurology, presentation of left sided weakness again. EEG showed focal seizure right frontal region. Takes Keppra since 07/23/20.  Hypothyroidism taking Levothyroxine, TSH 1 09/02/23  Hyponatremia stable, Na 133 09/02/23  Insomnia secondary to anxiety not sleeping well, prn Lorazepam, Ambien available to her, failed Mirtazapine, Lexapro  GERD (gastroesophageal reflux disease)  stable, Hgb 11.6 09/02/23  Slow transit constipation Stable,  takes MiraLax, Senokot S  Osteoarthritis, multiple sites left hip, s/p total left hip, multiple sites, takes Tylenol, Aleve, Gabapentin  for pain in toes, failed Methocarbamol recommended by Ortho-felt wobbly.   Osteoporosis Boniva, Ca, Vit D, declined DEXA, Vit D 16 10/07/21.   TIA (transient ischemic attack)  x2, off  Atorvastatin per patient's request, on ASA   Urinary frequency Urinary frequency, prolapsed bladder, chronic,  declined Myrbetriq 2/2 to the cost.    Family/ staff Communication: plan of care reviewed with the patient and charge nurse.   Labs/tests ordered:  none  Time spend 40 minutes.

## 2023-11-22 NOTE — Assessment & Plan Note (Signed)
Stable, takes MiraLax, Senokot S 

## 2023-11-22 NOTE — Assessment & Plan Note (Signed)
Urinary frequency, prolapsed bladder, chronic,  declined Myrbetriq 2/2 to the cost.

## 2023-11-22 NOTE — Assessment & Plan Note (Signed)
not sleeping well, prn Lorazepam, Ambien available to her, failed Mirtazapine, Lexapro

## 2023-11-22 NOTE — Assessment & Plan Note (Signed)
saw neurology, presentation of left sided weakness again. EEG showed focal seizure right frontal region. Takes Keppra since 07/23/20

## 2023-11-22 NOTE — Assessment & Plan Note (Signed)
Boniva, Ca, Vit D, declined DEXA, Vit D 16 10/07/21.

## 2023-11-22 NOTE — Assessment & Plan Note (Signed)
left hip, s/p total left hip, multiple sites, takes Tylenol, Aleve, Gabapentin for pain in toes, failed Methocarbamol recommended by Ortho-felt wobbly.

## 2023-12-23 ENCOUNTER — Encounter: Payer: Self-pay | Admitting: Nurse Practitioner

## 2023-12-23 ENCOUNTER — Non-Acute Institutional Stay (INDEPENDENT_AMBULATORY_CARE_PROVIDER_SITE_OTHER): Payer: Medicare Other | Admitting: Nurse Practitioner

## 2023-12-23 DIAGNOSIS — Z0000 Encounter for general adult medical examination without abnormal findings: Secondary | ICD-10-CM | POA: Diagnosis not present

## 2023-12-23 NOTE — Progress Notes (Signed)
Subjective:   Sara Davila is a 89 y.o. female who presents for Medicare Annual (Subsequent) preventive examination.  Visit Complete: In person  Patient Medicare AWV questionnaire was completed by the patient on 12/23/23; I have confirmed that all information answered by patient is correct and no changes since this date.  Cardiac Risk Factors include: advanced age (>43men, >60 women);dyslipidemia;hypertension     Objective:    Today's Vitals   12/23/23 1123 12/23/23 1642  BP: 125/71   Pulse: 80   Temp: 97.7 F (36.5 C)   SpO2: 95%   Weight: 99 lb 3.2 oz (45 kg)   Height: 5' (1.524 m)   PainSc: 0-No pain 5    Body mass index is 19.37 kg/m.     12/23/2023   11:25 AM 07/13/2023    8:33 AM 07/09/2023   12:30 PM 05/19/2023    9:23 AM 03/17/2023   11:53 AM 12/11/2022    2:50 PM 12/08/2022    9:52 AM  Advanced Directives  Does Patient Have a Medical Advance Directive? Yes Yes Yes Yes Yes Yes Yes  Type of Advance Directive Living will;Out of facility DNR (pink MOST or yellow form) Healthcare Power of [place];Out of facility DNR (pink MOST or yellow form);Living will Healthcare Power of [place];Out of facility DNR (pink MOST or yellow form);Living will Healthcare Power of [place];Out of facility DNR (pink MOST or yellow form);Living will Living will;Out of facility DNR (pink MOST or yellow form) Out of facility DNR (pink MOST or yellow form) Out of facility DNR (pink MOST or yellow form)  Does patient want to make changes to medical advance directive? No - Patient declined No - Patient declined No - Patient declined No - Patient declined No - Patient declined No - Patient declined No - Patient declined  Copy of Healthcare Power of Attorney in Chart?  Yes - validated most recent copy scanned in chart (See row information) Yes - validated most recent copy scanned in chart (See row information) Yes - validated most recent copy scanned in chart (See row information)     Pre-existing out of  facility DNR order (yellow form or pink MOST form) Yellow form placed in chart (order not valid for inpatient use);Pink MOST form placed in chart (order not valid for inpatient use) Pink MOST form placed in chart (order not valid for inpatient use);Yellow form placed in chart (order not valid for inpatient use) Pink MOST form placed in chart (order not valid for inpatient use);Yellow form placed in chart (order not valid for inpatient use) Pink MOST form placed in chart (order not valid for inpatient use);Yellow form placed in chart (order not valid for inpatient use) Pink MOST form placed in chart (order not valid for inpatient use);Yellow form placed in chart (order not valid for inpatient use)      Current Medications (verified) Outpatient Encounter Medications as of 12/23/2023  Medication Sig   acetaminophen (TYLENOL) 500 MG tablet Take 1,000 mg by mouth 2 (two) times daily.   aspirin EC 81 MG tablet Take 81 mg by mouth daily. Swallow whole.   cyanocobalamin (VITAMIN B12) 1000 MCG tablet Take 1,000 mcg by mouth daily.   furosemide (LASIX) 20 MG tablet Take 20 mg by mouth every other day.   FUROSEMIDE PO Take 30 mg by mouth every other day. See other entry also   gabapentin (NEURONTIN) 100 MG capsule Take 1 capsule (100 mg total) by mouth at bedtime.   ibandronate (BONIVA) 150 MG tablet  Take 150 mg by mouth every 30 (thirty) days. Take in the morning with a full glass of water, on an empty stomach, and do not take anything else by mouth or lie down for the next 30 min.   ibuprofen (ADVIL) 200 MG tablet Take 200 mg by mouth daily as needed.   levETIRAcetam (KEPPRA XR) 500 MG 24 hr tablet Take 1 tablet (500 mg total) by mouth daily.   levothyroxine (SYNTHROID) 75 MCG tablet Take 1 tablet (75 mcg total) by mouth daily before breakfast.   LORazepam (ATIVAN) 0.5 MG tablet Take 0.25 mg by mouth every 8 (eight) hours as needed for anxiety.   MELATONIN GUMMIES PO Take 5 mg by mouth at bedtime.    mupirocin ointment (BACTROBAN) 2 % Apply 1 Application topically as directed. Apply to irritated area topically as needed for irritation May keep at bedside to self administer twice daily as needed   Olopatadine HCl 0.7 % SOLN Apply to eye daily as needed (One drop to each eye).   Oyster Shell 500 MG TABS Take 1 tablet by mouth daily.   polyethylene glycol (MIRALAX / GLYCOLAX) 17 g packet Take 17 g by mouth daily as needed.   sennosides-docusate sodium (SENOKOT-S) 8.6-50 MG tablet Take 1-2 tablets by mouth at bedtime as needed for constipation.   sodium fluoride (FLUORISHIELD) 1.1 % GEL dental gel Place 1 Application onto teeth daily in the afternoon.   Vitamin D, Cholecalciferol, 50 MCG (2000 UT) CAPS Take 2,000 Units by mouth daily.    zolpidem (AMBIEN) 5 MG tablet Take 5 mg by mouth at bedtime as needed for sleep.   escitalopram (LEXAPRO) 5 MG tablet Take 5 mg by mouth daily. (Patient not taking: Reported on 12/23/2023)   LORazepam (ATIVAN) 0.5 MG tablet Take 0.5 tablets (0.25 mg total) by mouth 2 (two) times daily as needed for anxiety. (Patient not taking: Reported on 12/23/2023)   No facility-administered encounter medications on file as of 12/23/2023.    Allergies (verified) Codeine, Penicillins, and Levaquin [levofloxacin]   History: Past Medical History:  Diagnosis Date   Abdominal pain 02/26/2017   Anxiety    Arthritis    Left Knee, Wrist, Back and Hips   Cervical vertebral fusion    Diverticulitis    Diverticulosis    Fibromyalgia    GERD (gastroesophageal reflux disease)    Gout 02/2018   L hand   Hypertension     after d/c from hospital no current problems or medication   Hypothyroidism    Pelvis fracture (HCC)    Peritoneal free air 03/29/2012   Pneumonia    Pneumoperitoneum 02/26/2017   Pre-diabetes    Seizure (HCC)    Thyroid disorder    Toe pain 09/06/2016   Past Surgical History:  Procedure Laterality Date   ABDOMINAL HYSTERECTOMY     BOWEL RESECTION N/A  07/09/2017   Procedure: SMALL BOWEL RESECTION;  Surgeon: Luretha Murphy, MD;  Location: [facility];  Service: General;  Laterality: N/A;   CERVICAL FUSION     x2   CONVERSION TO TOTAL HIP Left 10/31/2020   Procedure: CONVERSION TO ANTERIOR TOTAL HIP;  Surgeon: Durene Romans, MD;  Location: [facility];  Service: Orthopedics;  Laterality: Left;  2 hrs   FEMUR IM NAIL Left 03/03/2020   Procedure: INTRAMEDULLARY (IM) NAIL FEMORAL;  Surgeon: Durene Romans, MD;  Location: [facility];  Service: Orthopedics;  Laterality: Left;   LAPAROSCOPIC APPENDECTOMY N/A 03/04/2017   Procedure: APPENDECTOMY LAPAROSCOPIC;  Surgeon: Luretha Murphy,  MD;  Location: [facility];  Service: General;  Laterality: N/A;   LAPAROSCOPY N/A 03/04/2017   Procedure: LAPAROSCOPY DIAGNOSTIC, ENTEROLYSIS;  Surgeon: Luretha Murphy, MD;  Location: [facility];  Service: General;  Laterality: N/A;   LAPAROTOMY N/A 07/09/2017   Procedure: EXPLORATORY LAPAROTOMY;  Surgeon: Luretha Murphy, MD;  Location: [facility];  Service: General;  Laterality: N/A;   SPINE SURGERY     Lumbar- rod placement   TONSILLECTOMY     89y/o   TOTAL VAGINAL HYSTERECTOMY     Family History  Problem Relation Age of Onset   Asthma Mother    Pulmonary embolism Mother    Macular degeneration Mother    Heart disease Father    Stroke Father    Stroke Paternal Uncle    Breast cancer Maternal Aunt    Macular degeneration Sister    Stroke Sister    Neuropathy Neg Hx    Social History   Socioeconomic History   Marital status: Widowed    Spouse name: Not on file   Number of children: 2   Years of education: RN   Highest education level: Not on file  Occupational History   Occupation: Retired   Tobacco Use   Smoking status: Former   Smokeless tobacco: Never   Tobacco comments:    Smoked from age [age]  Vaping Use   Vaping status: Never Used  Substance and Sexual Activity   Alcohol use: Not Currently    Comment: occasional glass of wine   Drug use: No   Sexual activity: Not  Currently  Other Topics Concern   Not on file  Social History Narrative   Lives at [facility] independent living    Caffeine use: occasional cup of coffee, 2-3 times a week   Left handed         Diet: Regular      Do you drink/ eat things with caffeine? No      Marital status: Widowed                              What year were you married ? 1986      Do you live in a house, apartment,assistred living, condo, trailer, etc.)? Independent Living Home       Is it one or more stories? 4 th floor      How many persons live in your home ? 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  Yes                            Type & how often 5-6 x WK      ADVANCED DIRECTIVES (Please bring copies)      Do you have a living will? Yes      Do you have a DNR form?   Yes                    If not, do you want to discuss one?       Do you have signed POA?HPOA forms?   Yes              If so, please  bring to your appointment      FUNCTIONAL STATUS- To be completed by Spouse / child / Staff       Do you have difficulty bathing or dressing yourself ? No      Do you have difficulty preparing food or eating ?  No      Do you have difficulty managing your mediation ?No      Do you have difficulty managing your finances ? No      Do you have difficulty affording your medication ? No      Social Drivers of Corporate investment banker Strain: Not on file  Food Insecurity: Not on file  Transportation Needs: Not on file  Physical Activity: Not on file  Stress: Not on file  Social Connections: Not on file    Tobacco Counseling Counseling given: Not Answered Tobacco comments: Smoked from age [age]   Clinical Intake:  Pre-visit preparation completed: Yes  Pain : 0-10 Pain Score: 5  Pain Type: Chronic pain Pain Location: Back Pain  Orientation: Mid Pain Descriptors / Indicators: Aching Pain Onset: More than a month ago Pain Frequency: Intermittent Pain Relieving Factors: [place] Effect of Pain on Daily Activities: less walkign  Pain Relieving Factors: [place]  BMI - recorded: 19.37 Nutritional Status: BMI of 19-24  Normal Diabetes: No  How often do you need to have someone help you when you read instructions, pamphlets, or other written materials from your doctor or pharmacy?: 4 - Often What is the last grade level you completed in school?: college  Interpreter Needed?: No  Information entered by :: Inette Doubrava Nedra Hai NP   Activities of Daily Living    12/23/2023    4:45 PM  In your present state of health, do you have any difficulty performing the following activities:  Hearing? 1  Vision? 0  Difficulty concentrating or making decisions? 1  Walking or climbing stairs? 1  Dressing or bathing? 1  Doing errands, shopping? 1  Preparing Food and eating ? N  Using the Toilet? N  In the past six months, have you accidently leaked urine? Y  Do you have problems with loss of bowel control? N  Managing your Medications? Y  Managing your Finances? Y  Housekeeping or managing your Housekeeping? Y    Patient Care Team: Venita Sheffield, MD as PCP - General (Internal Medicine)  Indicate any recent Medical Services you may have received from other than [facility] providers in the past year (date may be approximate).     Assessment:   This is a routine wellness examination for Shantice.  Hearing/Vision screen No results found.   Goals Addressed             This Visit's Progress    Maintain Mobility and Function       Evidence-based guidance:  Emphasize the importance of physical activity and aerobic exercise as included in treatment plan; assess barriers to adherence; consider patient's abilities and preferences.  Encourage gradual increase in activity or exercise instead of stopping if pain occurs.   Reinforce individual therapy exercise prescription, such as strengthening, stabilization and stretching programs.  Promote optimal body mechanics to stabilize the spine with lifting and functional activity.  Encourage activity and mobility modifications to facilitate optimal function, such as using a log roll for bed mobility or dressing from a seated position.  Reinforce individual adaptive equipment recommendations to limit excessive spinal movements, such as a Event organiser.  Assess adequacy of sleep; encourage use of  sleep hygiene techniques, such as bedtime routine; use of white noise; dark, cool bedroom; avoiding daytime naps, heavy meals or exercise before bedtime.  Promote positions and modification to optimize sleep and sexual activity; consider pillows or positioning devices to assist in maintaining neutral spine.  Explore options for applying ergonomic principles at work and home, such as frequent position changes, using ergonomically designed equipment and working at optimal height.  Promote modifications to increase comfort with driving such as lumbar support, optimizing seat and steering wheel position, using cruise control and taking frequent rest stops to stretch and walk.   Notes:        Depression Screen    12/23/2023    4:46 PM  PHQ 2/9 Scores  PHQ - 2 Score 0    Fall Risk    05/19/2023    9:23 AM 12/11/2022    2:50 PM 12/08/2022    9:52 AM 06/14/2020    9:06 AM 04/26/2020   10:08 AM  Fall Risk   Falls in the past year? 1 0 0 0 1  Number falls in past yr: 0 0 0 0 0  Injury with Fall? 1 0 0  1  Risk for fall due to : History of fall(s);Impaired balance/gait;Impaired mobility No Fall Risks No Fall Risks    Follow up Falls evaluation completed Falls evaluation completed Falls evaluation completed      MEDICARE RISK AT HOME: Medicare Risk at Home Any stairs in or around the home?: Yes If so, are there any without handrails?: No Home free of loose throw rugs in  walkways, pet beds, electrical cords, etc?: Yes Adequate lighting in your home to reduce risk of falls?: Yes Life alert?: No Use of a cane, walker or w/c?: Yes Grab bars in the bathroom?: Yes Shower chair or bench in shower?: Yes Elevated toilet seat or a handicapped toilet?: Yes  TIMED UP AND GO:  Was the test performed?  Yes  Length of time to ambulate 10 feet: 15 sec Gait slow and steady with assistive device    Cognitive Function:    12/17/2022   12:22 PM 11/20/2021   10:03 AM 12/01/2017    9:00 AM  MMSE - Mini Mental State Exam  Orientation to time 5 5 5   Orientation to Place 5 5 5   Registration 3 3 3   Attention/ Calculation 5 5 5   Recall 3 3 3   Language- name 2 objects 2 2 2   Language- repeat 1 1 1   Language- follow 3 step command 3 3 3   Language- read & follow direction 1 1 1   Write a sentence 1 1 1   Copy design 1 1 1   Total score 30 30 30         Immunizations Immunization History  Administered Date(s) Administered   Influenza Split 08/11/2018, 08/03/2019   Influenza, High Dose Seasonal PF 09/01/2023   Influenza-Unspecified 09/01/2018, 09/10/2020, 09/18/2021, 09/21/2022   Moderna Covid-19 Fall Seasonal Vaccine 21yrs & older 09/15/2023   Moderna Sars-Covid-2 Vaccination 12/04/2019, 01/01/2020   Pfizer Covid-19 Vaccine Bivalent Booster 72yrs & up 10/01/2022   Pneumococcal Conjugate-13 05/29/2014   Pneumococcal Polysaccharide-23 06/14/2015   Tdap 09/14/2012   Zoster Recombinant(Shingrix) 02/10/2022, 05/11/2022   Zoster, Live 04/30/2004, 04/13/2018, 07/05/2018    TDAP status: Due, Education has been provided regarding the importance of this vaccine. Advised may receive this vaccine at local pharmacy or Health Dept. Aware to provide a copy of the vaccination record if obtained from local pharmacy or Health Dept. Verbalized acceptance  and understanding.  Flu Vaccine status: Up to date  Pneumococcal vaccine status: Up to date  Covid-19 vaccine status: Completed  vaccines  Qualifies for Shingles Vaccine? Yes   Zostavax completed Yes   Shingrix Completed?: Yes  Screening Tests Health Maintenance  Topic Date Due   DTaP/Tdap/Td (2 - Td or Tdap) 09/14/2022   DEXA SCAN  05/18/2024 (Originally 10/25/1997)   Medicare Annual Wellness (AWV)  12/22/2024   Pneumonia Vaccine 80+ Years old  Completed   INFLUENZA VACCINE  Completed   COVID-19 Vaccine  Completed   Zoster Vaccines- Shingrix  Completed   HPV VACCINES  Aged Out    Health Maintenance  Health Maintenance Due  Topic Date Due   DTaP/Tdap/Td (2 - Td or Tdap) 09/14/2022    Colorectal cancer screening: No longer required.   Mammogram status: No longer required due to aged out.  Bone Density status: Completed 2020. Results reflect: Bone density results: OSTEOPOROSIS. Repeat every never per patient years.  Lung Cancer Screening: (Low Dose CT Chest recommended if Age 70-80 years, 20 pack-year currently smoking OR have quit w/in 15years.) does not qualify.   Lung Cancer Screening Referral: NA Additional Screening:  Hepatitis C Screening: does not qualify;   Vision Screening: Recommended annual ophthalmology exams for early detection of glaucoma and other disorders of the eye. Is the patient up to date with their annual eye exam?  No  Who is the provider or what is the name of the office in which the patient attends annual eye exams? [facility] will provide if desires.  If pt is not established with a provider, would they like to be referred to a provider to establish care? No .   Dental Screening: Recommended annual dental exams for proper oral hygiene  Diabetic Foot Exam: NA  Community Resource Referral / Chronic Care Management: CRR required this visit?  No   CCM required this visit?  No     Plan:     I have personally reviewed and noted the following in the patient's chart:   Medical and social history Use of alcohol, tobacco or illicit drugs  Current medications and supplements  including opioid prescriptions. Patient is currently taking opioid prescriptions. Information provided to patient regarding non-opioid alternatives. Patient advised to discuss non-opioid treatment plan with their provider. Functional ability and status Nutritional status Physical activity Advanced directives List of other physicians Hospitalizations, surgeries, and ER visits in previous 12 months Vitals Screenings to include cognitive, depression, and falls Referrals and appointments  In addition, I have reviewed and discussed with patient certain preventive protocols, quality metrics, and best practice recommendations. A written personalized care plan for preventive services as well as general preventive health recommendations were provided to patient.     Jayquan Bradsher X Rosmery Duggin, NP   12/24/2023   After Visit Summary: (In Person-Declined) Patient declined AVS at this time.

## 2023-12-24 ENCOUNTER — Encounter: Payer: Self-pay | Admitting: Nurse Practitioner

## 2024-01-07 ENCOUNTER — Non-Acute Institutional Stay: Payer: Medicare Other | Admitting: Nurse Practitioner

## 2024-01-07 ENCOUNTER — Encounter: Payer: Self-pay | Admitting: Nurse Practitioner

## 2024-01-07 DIAGNOSIS — F419 Anxiety disorder, unspecified: Secondary | ICD-10-CM

## 2024-01-07 DIAGNOSIS — J101 Influenza due to other identified influenza virus with other respiratory manifestations: Secondary | ICD-10-CM

## 2024-01-07 DIAGNOSIS — F5105 Insomnia due to other mental disorder: Secondary | ICD-10-CM

## 2024-01-07 DIAGNOSIS — R569 Unspecified convulsions: Secondary | ICD-10-CM | POA: Diagnosis not present

## 2024-01-07 DIAGNOSIS — G2581 Restless legs syndrome: Secondary | ICD-10-CM | POA: Diagnosis not present

## 2024-01-07 DIAGNOSIS — E039 Hypothyroidism, unspecified: Secondary | ICD-10-CM

## 2024-01-07 DIAGNOSIS — I872 Venous insufficiency (chronic) (peripheral): Secondary | ICD-10-CM | POA: Diagnosis not present

## 2024-01-07 NOTE — Assessment & Plan Note (Signed)
 Ambien  prn is adequate.

## 2024-01-07 NOTE — Assessment & Plan Note (Signed)
 takes Gabapentin, off Requip. Vit B12 818 03/25/23(taking Vit B12)

## 2024-01-07 NOTE — Assessment & Plan Note (Signed)
 sudden onset fever, 100.2, nasal congestion, aches all over, cough, poor appetite, denied sore throat, chest pain, SOB, GI symptoms, or dysuria, no O2 desaturation, tested positive Flu A.  Tamiflu Tessalon 100mg  tid x 3 days Prednisone 10mg  every day x 5 days.  May consider CXR, CBC/diff, CMP/eGFR if no better.

## 2024-01-07 NOTE — Assessment & Plan Note (Signed)
saw neurology, presentation of left sided weakness again. EEG showed focal seizure right frontal region. Takes Keppra since 07/23/20

## 2024-01-07 NOTE — Assessment & Plan Note (Signed)
 taking Levothyroxine, TSH 1 09/02/23

## 2024-01-07 NOTE — Assessment & Plan Note (Signed)
 hold Furosemide  until the patient's oral intake returns to her baseline or at least 5 days, 06/20/20 EF 60-65%, Bun/creat 15/0.59 09/02/23

## 2024-01-07 NOTE — Progress Notes (Signed)
 Location:   [facility] Room Number: 906 Place of Service:  ALF (13) Provider: Larwance Terris Germano NP  Sherlynn Madden, MD  Patient Care Team: Sherlynn Madden, MD as PCP - General (Internal Medicine)  Extended Emergency Contact Information Primary Emergency Contact: Norton,Ronald  [place] Home Phone: [phone] Mobile Phone: [phone] Relation: Friend Secondary Emergency Contact: Jud Drivers Address: [address]          [address]          [place], [place] [address] [place] Home Phone: [phone] Mobile Phone: [phone] Relation: Son  Code Status: DNR Goals of care: Advanced Directive information    12/23/2023   11:25 AM  Advanced Directives  Does Patient Have a Medical Advance Directive? Yes  Type of Advance Directive Living will;Out of facility DNR (pink MOST or yellow form)  Does patient want to make changes to medical advance directive? No - Patient declined  Pre-existing out of facility DNR order (yellow form or pink MOST form) Yellow form placed in chart (order not valid for inpatient use);Pink MOST form placed in chart (order not valid for inpatient use)     Chief Complaint  Patient presents with   Acute Visit    Fever, congestion, cough, poor appetite.     HPI:  Pt is a 89 y.o. female seen today for an acute visit for sudden onset fever, 100.2, nasal congestion, aches all over, cough, poor appetite, denied sore throat, chest pain, SOB, GI symptoms, or dysuria, no O2 desaturation, tested positive Flu A.    Edema BLE, takes Furosemide , 06/20/20 EF 60-65%, Bun/creat 15/0.59 09/02/23             RLS takes Gabapentin , off Requip. Vit B12 818 03/25/23(taking Vit B12)             Seizure, saw neurology, presentation of left sided weakness again. EEG showed focal seizure right frontal region. Takes Keppra  since 07/23/20.             Hypothyroidism, taking Levothyroxine , TSH 1 09/02/23             Hyponatremia,  stable, Na 133 09/02/23             Anxiety, not sleeping well, prn Lorazepam , Ambien  available to her, failed Mirtazapine, Lexapro             GERD, stable, Hgb 11.6 09/02/23             Constipation, takes MiraLax , Senokot S             OA, left hip, s/p total left hip, multiple sites, takes Tylenol , Aleve , Gabapentin  for pain in toes, failed Methocarbamol  recommended by Ortho-felt wobbly.              OP Boniva, Ca, Vit D, declined DEXA, Vit D 16 10/07/21.              TIA, x2, off  Atorvastatin  per patient's request, on ASA              Urinary frequency, prolapsed bladder, chronic,  declined Myrbetriq 2/2 to the cost.    Past Medical History:  Diagnosis Date   Abdominal pain 02/26/2017   Anxiety    Arthritis    Left Knee, Wrist, Back and Hips   Cervical vertebral fusion    Diverticulitis    Diverticulosis    Fibromyalgia    GERD (gastroesophageal reflux disease)    Gout 02/2018   L  hand   Hypertension     after d/c from hospital no current problems or medication   Hypothyroidism    Pelvis fracture (HCC)    Peritoneal free air 03/29/2012   Pneumonia    Pneumoperitoneum 02/26/2017   Pre-diabetes    Seizure (HCC)    Thyroid  disorder    Toe pain 09/06/2016   Past Surgical History:  Procedure Laterality Date   ABDOMINAL HYSTERECTOMY     BOWEL RESECTION N/A 07/09/2017   Procedure: SMALL BOWEL RESECTION;  Surgeon: Gladis Cough, MD;  Location: [facility];  Service: General;  Laterality: N/A;   CERVICAL FUSION     x2   CONVERSION TO TOTAL HIP Left 10/31/2020   Procedure: CONVERSION TO ANTERIOR TOTAL HIP;  Surgeon: Ernie Cough, MD;  Location: [facility];  Service: Orthopedics;  Laterality: Left;  2 hrs   FEMUR IM NAIL Left 03/03/2020   Procedure: INTRAMEDULLARY (IM) NAIL FEMORAL;  Surgeon: Ernie Cough, MD;  Location: [facility];  Service: Orthopedics;  Laterality: Left;   LAPAROSCOPIC APPENDECTOMY N/A 03/04/2017   Procedure: APPENDECTOMY LAPAROSCOPIC;  Surgeon: Cough Gladis, MD;  Location:  [facility];  Service: General;  Laterality: N/A;   LAPAROSCOPY N/A 03/04/2017   Procedure: LAPAROSCOPY DIAGNOSTIC, ENTEROLYSIS;  Surgeon: Cough Gladis, MD;  Location: [facility];  Service: General;  Laterality: N/A;   LAPAROTOMY N/A 07/09/2017   Procedure: EXPLORATORY LAPAROTOMY;  Surgeon: Gladis Cough, MD;  Location: [facility];  Service: General;  Laterality: N/A;   SPINE SURGERY     Lumbar- rod placement   TONSILLECTOMY     89y/o   TOTAL VAGINAL HYSTERECTOMY      Allergies  Allergen Reactions   Codeine Rash   Penicillins Rash    Has patient had a PCN reaction causing immediate rash, facial/tongue/throat swelling, SOB or lightheadedness with hypotension: No Has patient had a PCN reaction causing severe rash involving mucus membranes or skin necrosis: No Has patient had a PCN reaction that required hospitalization: No Has patient had a PCN reaction occurring within the last 10 years: No If all of the above answers are NO, then may proceed with Cephalosporin use.  Tolerated Cephalosporin 10/31/20     Levaquin [Levofloxacin]     Allergies as of 01/07/2024       Reactions   Codeine Rash   Penicillins Rash   Has patient had a PCN reaction causing immediate rash, facial/tongue/throat swelling, SOB or lightheadedness with hypotension: No Has patient had a PCN reaction causing severe rash involving mucus membranes or skin necrosis: No Has patient had a PCN reaction that required hospitalization: No Has patient had a PCN reaction occurring within the last 10 years: No If all of the above answers are NO, then may proceed with Cephalosporin use. Tolerated Cephalosporin 10/31/20   Levaquin [levofloxacin]         Medication List        Accurate as of January 07, 2024  2:17 PM. If you have any questions, ask your nurse or doctor.          acetaminophen  500 MG tablet Commonly known as: TYLENOL  Take 1,000 mg by mouth 2 (two) times daily.   aspirin  EC 81 MG tablet Take 81 mg by  mouth daily. Swallow whole.   cyanocobalamin 1000 MCG tablet Commonly known as: VITAMIN B12 Take 1,000 mcg by mouth daily.   escitalopram 5 MG tablet Commonly known as: LEXAPRO Take 5 mg by mouth daily.   furosemide  20 MG tablet Commonly known as: LASIX  Take  20 mg by mouth every other day.   FUROSEMIDE  PO Take 30 mg by mouth every other day. See other entry also   gabapentin  100 MG capsule Commonly known as: NEURONTIN  Take 1 capsule (100 mg total) by mouth at bedtime.   ibandronate 150 MG tablet Commonly known as: BONIVA Take 150 mg by mouth every 30 (thirty) days. Take in the morning with a full glass of water , on an empty stomach, and do not take anything else by mouth or lie down for the next 30 min.   ibuprofen  200 MG tablet Commonly known as: ADVIL  Take 200 mg by mouth daily as needed.   levETIRAcetam  500 MG 24 hr tablet Commonly known as: Keppra  XR Take 1 tablet (500 mg total) by mouth daily.   levothyroxine  75 MCG tablet Commonly known as: SYNTHROID  Take 1 tablet (75 mcg total) by mouth daily before breakfast.   LORazepam  0.5 MG tablet Commonly known as: ATIVAN  Take 0.25 mg by mouth every 8 (eight) hours as needed for anxiety.   LORazepam  0.5 MG tablet Commonly known as: Ativan  Take 0.5 tablets (0.25 mg total) by mouth 2 (two) times daily as needed for anxiety.   MELATONIN GUMMIES PO Take 5 mg by mouth at bedtime.   mupirocin  ointment 2 % Commonly known as: BACTROBAN  Apply 1 Application topically as directed. Apply to irritated area topically as needed for irritation May keep at bedside to self administer twice daily as needed   Olopatadine HCl 0.7 % Soln Apply to eye daily as needed (One drop to each eye).   Oyster Shell 500 MG Tabs Take 1 tablet by mouth daily.   polyethylene glycol 17 g packet Commonly known as: MIRALAX  / GLYCOLAX  Take 17 g by mouth daily as needed.   sennosides-docusate sodium  8.6-50 MG tablet Commonly known as:  SENOKOT-S Take 1-2 tablets by mouth at bedtime as needed for constipation.   sodium fluoride  1.1 % Gel dental gel Commonly known as: FLUORISHIELD Place 1 Application onto teeth daily in the afternoon.   vitamin D3 50 MCG (2000 UT) Caps Take 2,000 Units by mouth daily.   zolpidem  5 MG tablet Commonly known as: AMBIEN  Take 5 mg by mouth at bedtime as needed for sleep.        Review of Systems  Constitutional:  Positive for appetite change, fatigue and fever.  HENT:  Positive for congestion and hearing loss. Negative for sore throat, trouble swallowing and voice change.   Eyes:  Negative for redness and visual disturbance.  Respiratory:  Positive for cough and shortness of breath. Negative for wheezing.        DOE  Cardiovascular:  Negative for leg swelling.  Gastrointestinal:  Negative for abdominal pain and constipation.  Genitourinary:  Positive for frequency. Negative for dysuria and urgency.       Bathroom trips 3-4x/night  Musculoskeletal:  Positive for arthralgias, back pain and gait problem.       Left hip pain, s/p total left hip arthroplasty. Multiple sites aches in am, better as day goes by. Pain in toes.  Chronic back pain  Skin:        Left 3rd toe sore, warmth, no increased swelling or open wound  Neurological:  Negative for seizures, weakness and headaches.       Restless legs at night.  Memory lapses.   Psychiatric/Behavioral:  Positive for sleep disturbance. Negative for behavioral problems. The patient is nervous/anxious.     Immunization History  Administered Date(s) Administered  Influenza Split 08/11/2018, 08/03/2019   Influenza, High Dose Seasonal PF 09/01/2023   Influenza-Unspecified 09/01/2018, 09/10/2020, 09/18/2021, 09/21/2022   Moderna Covid-19 Fall Seasonal Vaccine 33yrs & older 09/15/2023   Moderna Sars-Covid-2 Vaccination 12/04/2019, 01/01/2020   Pfizer Covid-19 Vaccine Bivalent Booster 62yrs & up 10/01/2022   Pneumococcal Conjugate-13  05/29/2014   Pneumococcal Polysaccharide-23 06/14/2015   Tdap 09/14/2012   Zoster Recombinant(Shingrix) 02/10/2022, 05/11/2022   Zoster, Live 04/30/2004, 04/13/2018, 07/05/2018   Pertinent  Health Maintenance Due  Topic Date Due   DEXA SCAN  05/18/2024 (Originally 10/25/1997)   INFLUENZA VACCINE  Completed      11/04/2020   12:00 PM 04/23/2021   11:34 AM 12/08/2022    9:52 AM 12/11/2022    2:50 PM 05/19/2023    9:23 AM  Fall Risk  Falls in the past year?   0 0 1  Was there an injury with Fall?   0 0 1  Fall Risk Category Calculator   0 0 2  Fall Risk Category (Retired)   Low Low   (RETIRED) Patient Fall Risk Level High fall risk Moderate fall risk Low fall risk Low fall risk   Patient at Risk for Falls Due to   No Fall Risks No Fall Risks History of fall(s);Impaired balance/gait;Impaired mobility  Fall risk Follow up   Falls evaluation completed Falls evaluation completed Falls evaluation completed   Functional Status Survey:    Vitals:   01/07/24 1402  BP: 123/60  Pulse: 79  Resp: 19  Temp: 100.2 F (37.9 C)  SpO2: 95%   There is no height or weight on file to calculate BMI. Physical Exam Constitutional:      Comments: fatigue  HENT:     Head: Normocephalic and atraumatic.     Mouth/Throat:     Mouth: Mucous membranes are moist.     Pharynx: Posterior oropharyngeal erythema present.  Eyes:     Extraocular Movements: Extraocular movements intact.     Pupils: Pupils are equal, round, and reactive to light.  Cardiovascular:     Rate and Rhythm: Normal rate and regular rhythm.     Heart sounds: No murmur heard.    Comments: Weak left DP pulse, more symptomatic tingling sensation at night.  Pulmonary:     Effort: Pulmonary effort is normal.     Breath sounds: No wheezing, rhonchi or rales.     Comments: Central congestion Abdominal:     General: Bowel sounds are normal.     Palpations: Abdomen is soft.     Tenderness: There is no abdominal tenderness.   Musculoskeletal:        General: Tenderness present.     Cervical back: Normal range of motion and neck supple.     Right lower leg: No edema.     Left lower leg: No edema.     Comments: mild scoliosis. Hx of back surgeries x3 with left sciatica pain. s/p total left hip replacement. R SIJ pain, positional, palpated, still able to bear weight, walking  Skin:    General: Skin is warm and dry.  Neurological:     General: No focal deficit present.     Mental Status: She is alert and oriented to person, place, and time. Mental status is at baseline.     Gait: Gait abnormal.     Comments: Using walker sometimes.   Psychiatric:        Mood and Affect: Mood normal.        Behavior: Behavior normal.  Thought Content: Thought content normal.        Judgment: Judgment normal.     Labs reviewed: Recent Labs    02/23/23 0000 09/02/23 0000  NA 132* 133*  K 4.7 4.5  CL 99 99  CO2 28* 26*  BUN 11 15  CREATININE 0.7 0.6  CALCIUM  9.0 9.1   Recent Labs    02/23/23 0000 09/02/23 0000  AST 17  --   ALT 10  --   ALKPHOS 44  --   ALBUMIN  3.7 3.5   Recent Labs    02/23/23 0000 09/02/23 0000  WBC 3.9 4.7  NEUTROABS 1,860.00 2,571.00  HGB 12.2 11.6*  HCT 36 35*  PLT 365 383   Lab Results  Component Value Date   TSH 1.00 09/02/2023   Lab Results  Component Value Date   HGBA1C 5.8 (H) 10/21/2020   Lab Results  Component Value Date   CHOL 171 06/20/2020   HDL 59 06/20/2020   LDLCALC 94 06/20/2020   TRIG 89 06/20/2020   CHOLHDL 2.9 06/20/2020    Significant Diagnostic Results in last 30 days:  No results found.  Assessment/Plan: Influenza A sudden onset fever, 100.2, nasal congestion, aches all over, cough, poor appetite, denied sore throat, chest pain, SOB, GI symptoms, or dysuria, no O2 desaturation, tested positive Flu A.  Tamiflu Tessalon 100mg  tid x 3 days Prednisone 10mg  every day x 5 days.  May consider CXR, CBC/diff, CMP/eGFR if no better.   Venous  insufficiency hold Furosemide  until the patient's oral intake returns to her baseline or at least 5 days, 06/20/20 EF 60-65%, Bun/creat 15/0.59 09/02/23  Restless leg syndrome  takes Gabapentin , off Requip. Vit B12 818 03/25/23(taking Vit B12)  Seizures (HCC) saw neurology, presentation of left sided weakness again. EEG showed focal seizure right frontal region. Takes Keppra  since 07/23/20.  Hypothyroidism  taking Levothyroxine , TSH 1 09/02/23    Family/ staff Communication: plan of care reviewed with the patient and charge nurse.   Labs/tests ordered:  none

## 2024-02-25 ENCOUNTER — Encounter: Payer: Self-pay | Admitting: Sports Medicine

## 2024-02-25 ENCOUNTER — Non-Acute Institutional Stay: Payer: Self-pay | Admitting: Sports Medicine

## 2024-02-25 DIAGNOSIS — F411 Generalized anxiety disorder: Secondary | ICD-10-CM

## 2024-02-25 DIAGNOSIS — E039 Hypothyroidism, unspecified: Secondary | ICD-10-CM | POA: Diagnosis not present

## 2024-02-25 DIAGNOSIS — G629 Polyneuropathy, unspecified: Secondary | ICD-10-CM | POA: Diagnosis not present

## 2024-02-25 DIAGNOSIS — F5105 Insomnia due to other mental disorder: Secondary | ICD-10-CM

## 2024-02-25 DIAGNOSIS — M810 Age-related osteoporosis without current pathological fracture: Secondary | ICD-10-CM | POA: Diagnosis not present

## 2024-02-25 DIAGNOSIS — F419 Anxiety disorder, unspecified: Secondary | ICD-10-CM

## 2024-02-25 DIAGNOSIS — I872 Venous insufficiency (chronic) (peripheral): Secondary | ICD-10-CM | POA: Diagnosis not present

## 2024-02-25 NOTE — Progress Notes (Signed)
 Provider:  Dr. Venita Sheffield Location:  [facility] Place of Service:   Assisted living   PCP: Venita Sheffield, MD Patient Care Team: Venita Sheffield, MD as PCP - General (Internal Medicine)  Extended Emergency Contact Information Primary Emergency Contact: Avanell Shackleton [place] of [place] Home Phone: [phone] Mobile Phone: [phone] Relation: Friend Secondary Emergency Contact: Bradly Chris Address: [address]          [address]          [place], [place] [address] [place] Home Phone: [phone] Mobile Phone: [phone] Relation: Son  Goals of Care: Advanced Directive information    12/23/2023   11:25 AM  Advanced Directives  Does Patient Have a Medical Advance Directive? Yes  Type of Advance Directive Living will;Out of facility DNR (pink MOST or yellow form)  Does patient want to make changes to medical advance directive? No - Patient declined  Pre-existing out of facility DNR order (yellow form or pink MOST form) Yellow form placed in chart (order not valid for inpatient use);Pink MOST form placed in chart (order not valid for inpatient use)       History of Present Illness          89 yr old F with h/o  TIA, OA, Constipation, GERD, Hyponatremia, Hypothyroidism is evaluated for chronic disease management.  The patient presents with difficulty moving her right leg and pain when using a walker.  She has been experiencing difficulty moving her right leg for approximately two months, particularly upon waking. The pain, described as a 'tired pain', is primarily felt when walking with her walker and does not radiate down her legs. No recent falls or injuries to her back, although she did fall four years ago. Standing up straight provides some relief. She is currently taking ibuprofen, which offers some relief, and has received spinal injections twice, which provided relief for about a week each time but are  costly.  She experiences breathlessness attributed to a past COVID-19 infection. No current chest pain or palpitations. She states that her heart and lungs are fine.  She has a history of diverticulosis with large diverticuli that are increasing in size. She manages her bowel movements with Miralax almost every night and occasionally uses a laxative. She experiences variable stool consistency, sometimes having hard stools.  She reports numbness in her left hand, which she attributes to holding objects tightly. This symptom is worsening over time.  She mentions having a hernia for about forty years, which does not cause pain. Her mother had a similar condition. She had two and a half feet of her small intestine removed eight years ago, which initially helped her condition.  She is on Lasix, taking one and a half tablets every other day, which she notes keeps her indoors due to its effects. Past Medical History:  Diagnosis Date   Abdominal pain 02/26/2017   Anxiety    Arthritis    Left Knee, Wrist, Back and Hips   Cervical vertebral fusion    Diverticulitis    Diverticulosis    Fibromyalgia    GERD (gastroesophageal reflux disease)    Gout 02/2018   L hand   Hypertension     after d/c from hospital no current problems or medication   Hypothyroidism    Pelvis fracture (HCC)    Peritoneal free air 03/29/2012   Pneumonia    Pneumoperitoneum 02/26/2017   Pre-diabetes    Seizure (HCC)    Thyroid disorder  Toe pain 09/06/2016   Past Surgical History:  Procedure Laterality Date   ABDOMINAL HYSTERECTOMY     BOWEL RESECTION N/A 07/09/2017   Procedure: SMALL BOWEL RESECTION;  Surgeon: Luretha Murphy, MD;  Location: [facility];  Service: General;  Laterality: N/A;   CERVICAL FUSION     x2   CONVERSION TO TOTAL HIP Left 10/31/2020   Procedure: CONVERSION TO ANTERIOR TOTAL HIP;  Surgeon: Durene Romans, MD;  Location: [facility];  Service: Orthopedics;  Laterality: Left;  2 hrs   FEMUR IM NAIL  Left 03/03/2020   Procedure: INTRAMEDULLARY (IM) NAIL FEMORAL;  Surgeon: Durene Romans, MD;  Location: [facility];  Service: Orthopedics;  Laterality: Left;   LAPAROSCOPIC APPENDECTOMY N/A 03/04/2017   Procedure: APPENDECTOMY LAPAROSCOPIC;  Surgeon: Luretha Murphy, MD;  Location: [facility];  Service: General;  Laterality: N/A;   LAPAROSCOPY N/A 03/04/2017   Procedure: LAPAROSCOPY DIAGNOSTIC, ENTEROLYSIS;  Surgeon: Luretha Murphy, MD;  Location: [facility];  Service: General;  Laterality: N/A;   LAPAROTOMY N/A 07/09/2017   Procedure: EXPLORATORY LAPAROTOMY;  Surgeon: Luretha Murphy, MD;  Location: [facility];  Service: General;  Laterality: N/A;   SPINE SURGERY     Lumbar- rod placement   TONSILLECTOMY     89y/o   TOTAL VAGINAL HYSTERECTOMY      reports that she has quit smoking. She has never used smokeless tobacco. She reports that she does not currently use alcohol. She reports that she does not use drugs. Social History   Socioeconomic History   Marital status: Widowed    Spouse name: Not on file   Number of children: 2   Years of education: RN   Highest education level: Not on file  Occupational History   Occupation: Retired   Tobacco Use   Smoking status: Former   Smokeless tobacco: Never   Tobacco comments:    Smoked from age [age]  Vaping Use   Vaping status: Never Used  Substance and Sexual Activity   Alcohol use: Not Currently    Comment: occasional glass of wine   Drug use: No   Sexual activity: Not Currently  Other Topics Concern   Not on file  Social History Narrative   Lives at [facility]    Caffeine use: occasional cup of coffee, 2-3 times a week   Left handed         Diet: Regular      Do you drink/ eat things with caffeine? No      Marital status: Widowed                              What year were you married ? 1986      Do you live in a house, apartment,assistred living, condo, trailer, etc.)? Independent Living Home       Is it one or  more stories? 4 th floor      How many persons live in your home ? 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  Yes                            Type & how often 5-6 x  WK      ADVANCED DIRECTIVES (Please bring copies)      Do you have a living will? Yes      Do you have a DNR form?   Yes                    If not, do you want to discuss one?       Do you have signed POA?HPOA forms?   Yes              If so, please bring to your appointment      FUNCTIONAL STATUS- To be completed by Spouse / child / Staff       Do you have difficulty bathing or dressing yourself ? No      Do you have difficulty preparing food or eating ?  No      Do you have difficulty managing your mediation ?No      Do you have difficulty managing your finances ? No      Do you have difficulty affording your medication ? No      Social Drivers of Corporate investment banker Strain: Not on file  Food Insecurity: Not on file  Transportation Needs: Not on file  Physical Activity: Not on file  Stress: Not on file  Social Connections: Not on file  Intimate Partner Violence: Not on file    Functional Status Survey:    Family History  Problem Relation Age of Onset   Asthma Mother    Pulmonary embolism Mother    Macular degeneration Mother    Heart disease Father    Stroke Father    Stroke Paternal Uncle    Breast cancer Maternal Aunt    Macular degeneration Sister    Stroke Sister    Neuropathy Neg Hx     Health Maintenance  Topic Date Due   DTaP/Tdap/Td (2 - Td or Tdap) 09/14/2022   DEXA SCAN  05/18/2024 (Originally 10/25/1997)   COVID-19 Vaccine (5 - 2024-25 season) 03/15/2024   Medicare Annual Wellness (AWV)  12/22/2024   Pneumonia Vaccine 41+ Years old  Completed   INFLUENZA VACCINE  Completed   Zoster Vaccines- Shingrix  Completed   HPV VACCINES  Aged  Out    Allergies  Allergen Reactions   Codeine Rash   Penicillins Rash    Has patient had a PCN reaction causing immediate rash, facial/tongue/throat swelling, SOB or lightheadedness with hypotension: No Has patient had a PCN reaction causing severe rash involving mucus membranes or skin necrosis: No Has patient had a PCN reaction that required hospitalization: No Has patient had a PCN reaction occurring within the last 10 years: No If all of the above answers are "NO", then may proceed with Cephalosporin use.  Tolerated Cephalosporin 10/31/20     Levaquin [Levofloxacin]     Outpatient Encounter Medications as of 02/25/2024  Medication Sig   acetaminophen (TYLENOL) 500 MG tablet Take 1,000 mg by mouth 2 (two) times daily.   aspirin EC 81 MG tablet Take 81 mg by mouth daily. Swallow whole.   cyanocobalamin (VITAMIN B12) 1000 MCG tablet Take 1,000 mcg by mouth daily.   escitalopram (LEXAPRO) 5 MG tablet Take 5 mg by mouth daily. (Patient not taking: Reported on 12/23/2023)   furosemide (LASIX) 20 MG tablet Take 20 mg by mouth every other day.   FUROSEMIDE PO Take 30 mg by mouth every other day. See other entry also   gabapentin (NEURONTIN) 100  MG capsule Take 1 capsule (100 mg total) by mouth at bedtime.   ibandronate (BONIVA) 150 MG tablet Take 150 mg by mouth every 30 (thirty) days. Take in the morning with a full glass of water, on an empty stomach, and do not take anything else by mouth or lie down for the next 30 min.   ibuprofen (ADVIL) 200 MG tablet Take 200 mg by mouth daily as needed.   levETIRAcetam (KEPPRA XR) 500 MG 24 hr tablet Take 1 tablet (500 mg total) by mouth daily.   levothyroxine (SYNTHROID) 75 MCG tablet Take 1 tablet (75 mcg total) by mouth daily before breakfast.   LORazepam (ATIVAN) 0.5 MG tablet Take 0.5 tablets (0.25 mg total) by mouth 2 (two) times daily as needed for anxiety. (Patient not taking: Reported on 12/23/2023)   LORazepam (ATIVAN) 0.5 MG tablet Take  0.25 mg by mouth every 8 (eight) hours as needed for anxiety.   MELATONIN GUMMIES PO Take 5 mg by mouth at bedtime.   mupirocin ointment (BACTROBAN) 2 % Apply 1 Application topically as directed. Apply to irritated area topically as needed for irritation May keep at bedside to self administer twice daily as needed   Olopatadine HCl 0.7 % SOLN Apply to eye daily as needed (One drop to each eye).   Oyster Shell 500 MG TABS Take 1 tablet by mouth daily.   polyethylene glycol (MIRALAX / GLYCOLAX) 17 g packet Take 17 g by mouth daily as needed.   sennosides-docusate sodium (SENOKOT-S) 8.6-50 MG tablet Take 1-2 tablets by mouth at bedtime as needed for constipation.   sodium fluoride (FLUORISHIELD) 1.1 % GEL dental gel Place 1 Application onto teeth daily in the afternoon.   Vitamin D, Cholecalciferol, 50 MCG (2000 UT) CAPS Take 2,000 Units by mouth daily.    zolpidem (AMBIEN) 5 MG tablet Take 5 mg by mouth at bedtime as needed for sleep.   No facility-administered encounter medications on file as of 02/25/2024.    Review of Systems Negative unless indicated in HPI.  There were no vitals filed for this visit. There is no height or weight on file to calculate BMI. BP Readings from Last 3 Encounters:  01/07/24 123/60  12/23/23 125/71  11/22/23 106/68   Wt Readings from Last 3 Encounters:  12/23/23 99 lb 3.2 oz (45 kg)  11/22/23 101 lb 3.2 oz (45.9 kg)  08/30/23 102 lb 9.6 oz (46.5 kg)   Physical Exam  Labs reviewed: Basic Metabolic Panel: Recent Labs    09/02/23 0000  NA 133*  K 4.5  CL 99  CO2 26*  BUN 15  CREATININE 0.6  CALCIUM 9.1   Liver Function Tests: Recent Labs    09/02/23 0000  ALBUMIN 3.5   No results for input(s): "LIPASE", "AMYLASE" in the last 8760 hours. No results for input(s): "AMMONIA" in the last 8760 hours. CBC: Recent Labs    09/02/23 0000  WBC 4.7  NEUTROABS 2,571.00  HGB 11.6*  HCT 35*  PLT 383   Cardiac Enzymes: No results for input(s):  "CKTOTAL", "CKMB", "CKMBINDEX", "TROPONINI" in the last 8760 hours. BNP: Invalid input(s): "POCBNP" Lab Results  Component Value Date   HGBA1C 5.8 (H) 10/21/2020   Lab Results  Component Value Date   TSH 1.00 09/02/2023   Lab Results  Component Value Date   VITAMINB12 1,034 09/02/2023   Lab Results  Component Value Date   FOLATE 19.8 12/01/2017   No results found for: "IRON", "TIBC", "FERRITIN"  Imaging and  Procedures obtained prior to SNF admission: CT ABDOMEN PELVIS WO CONTRAST Result Date: 04/23/2021 CLINICAL DATA:  89 year old female with abdominal distension and pain. EXAM: CT ABDOMEN AND PELVIS WITHOUT CONTRAST TECHNIQUE: Multidetector CT imaging of the abdomen and pelvis was performed following the standard protocol without IV contrast. COMPARISON:  CT abdomen pelvis dated 02/26/2017. FINDINGS: Evaluation of this exam is limited in the absence of intravenous contrast. Evaluation is also limited due to streak artifact caused by spinal hardware. Lower chest: Bibasilar linear atelectasis/scarring. The visualized lung bases are otherwise clear. No intra-abdominal free air or free fluid. Hepatobiliary: Several small liver cysts. No intrahepatic biliary ductal dilatation. The gallbladder is unremarkable. Pancreas: Unremarkable. No pancreatic ductal dilatation or surrounding inflammatory changes. Spleen: Normal in size without focal abnormality. Adrenals/Urinary Tract: The adrenal glands are unremarkable. There is no hydronephrosis or nephrolithiasis on either side. The visualized ureters appear unremarkable. The urinary bladder is distended and grossly unremarkable. Stomach/Bowel: Large duodenal diverticula measure up to 5 cm. No definite inflammatory changes. There is moderate stool throughout the colon. There is sigmoid diverticulosis without active inflammatory changes. There is no bowel obstruction. Appendectomy. Vascular/Lymphatic: Mild aortoiliac atherosclerotic disease. The IVC is  unremarkable. No portal venous gas. There is no adenopathy. Reproductive: Hysterectomy. No adnexal masses. Other: None Musculoskeletal: Osteopenia with scoliosis and degenerative changes of the spine. Extensive lumbar disc spacers and posterior fusion hardware. The hardware appears intact. There is no acute fracture. Total left hip arthroplasty. IMPRESSION: 1. No acute intra-abdominal or pelvic pathology. 2. Moderate colonic stool burden. No bowel obstruction. 3. Duodenal and sigmoid diverticulosis. 4. Aortic Atherosclerosis (ICD10-I70.0). Electronically Signed   By: Elgie Collard M.D.   On: 04/23/2021 14:59    Assessment and Plan    Venous insufficiency  Lungs clear Minimal lower extremity swelling Cont with lasix  Will check bmp  Neuropathy  Cont with gabapentin   OA  Cont with ibu prn    Seizure disorder  Cont with keppra  Hypothyroidism  Will check TSH  Will cont with synthyroid   GAD  Cont with ativan prn    Insomnia Cont with zolpidem    Will check labs cbc, bmp, TSH, vit d levels  30 min Total time spent for obtaining history,  performing a medically appropriate examination and evaluation, reviewing the tests,   documenting clinical information in the electronic or other health record, ,care coordination (not separately reported)

## 2024-05-10 ENCOUNTER — Non-Acute Institutional Stay: Admitting: Nurse Practitioner

## 2024-05-10 ENCOUNTER — Encounter: Payer: Self-pay | Admitting: Nurse Practitioner

## 2024-05-10 DIAGNOSIS — R54 Age-related physical debility: Secondary | ICD-10-CM | POA: Insufficient documentation

## 2024-05-10 DIAGNOSIS — K5901 Slow transit constipation: Secondary | ICD-10-CM

## 2024-05-10 DIAGNOSIS — G2581 Restless legs syndrome: Secondary | ICD-10-CM

## 2024-05-10 DIAGNOSIS — K219 Gastro-esophageal reflux disease without esophagitis: Secondary | ICD-10-CM

## 2024-05-10 DIAGNOSIS — F419 Anxiety disorder, unspecified: Secondary | ICD-10-CM

## 2024-05-10 DIAGNOSIS — I872 Venous insufficiency (chronic) (peripheral): Secondary | ICD-10-CM

## 2024-05-10 DIAGNOSIS — R569 Unspecified convulsions: Secondary | ICD-10-CM

## 2024-05-10 DIAGNOSIS — G459 Transient cerebral ischemic attack, unspecified: Secondary | ICD-10-CM

## 2024-05-10 DIAGNOSIS — M8000XS Age-related osteoporosis with current pathological fracture, unspecified site, sequela: Secondary | ICD-10-CM

## 2024-05-10 DIAGNOSIS — E871 Hypo-osmolality and hyponatremia: Secondary | ICD-10-CM

## 2024-05-10 DIAGNOSIS — E039 Hypothyroidism, unspecified: Secondary | ICD-10-CM

## 2024-05-10 DIAGNOSIS — F5105 Insomnia due to other mental disorder: Secondary | ICD-10-CM

## 2024-05-10 DIAGNOSIS — M150 Primary generalized (osteo)arthritis: Secondary | ICD-10-CM

## 2024-05-10 DIAGNOSIS — R350 Frequency of micturition: Secondary | ICD-10-CM

## 2024-05-10 NOTE — Assessment & Plan Note (Signed)
Urinary frequency, prolapsed bladder, chronic,  declined Myrbetriq 2/2 to the cost.

## 2024-05-10 NOTE — Assessment & Plan Note (Signed)
 still ambulates with walker slowly. Update CBC/diff, CMP/eGFR, wants to add MVI daily.

## 2024-05-10 NOTE — Progress Notes (Signed)
 Location:   [facility] Room Number: 906 Place of Service:  ALF (13) Provider: Abner Hoffman Vin Yonke NP  Tye Gall, MD  Patient Care Team: Tye Gall, MD as PCP - General (Internal Medicine)  Extended Emergency Contact Information Primary Emergency Contact: Norton,Ronald  [place] Home Phone: [phone] Mobile Phone: [phone] Relation: Friend Secondary Emergency Contact: Ami Kail Address: [address]          [address]          [place], [place] [address] [place] Home Phone: [phone] Mobile Phone: [phone] Relation: Son  Code Status:  DNR Goals of care: Advanced Directive information    12/23/2023   11:25 AM  Advanced Directives  Does Patient Have a Medical Advance Directive? Yes  Type of Advance Directive Living will;Out of facility DNR (pink MOST or yellow form)  Does patient want to make changes to medical advance directive? No - Patient declined  Pre-existing out of facility DNR order (yellow form or pink MOST form) Yellow form placed in chart (order not valid for inpatient use);Pink MOST form placed in chart (order not valid for inpatient use)     Chief Complaint  Patient presents with   Medical Management of Chronic Issues    HPI:  Pt is a 89 y.o. female seen today for medical management of chronic diseases.    Generalized frailty, still ambulates with walker slowly.   Edema BLE, takes Furosemide , 06/20/20 EF 60-65%, Bun/creat 15/0.65 02/29/24             RLS takes Gabapentin , off Requip. Vit B12 818 03/25/23(taking Vit B12)             Seizure, saw neurology, presentation of left sided weakness again. EEG showed focal seizure right frontal region. Takes Keppra  since 07/23/20.             Hypothyroidism, taking Levothyroxine , TSH 1.72 03/30/24             Hyponatremia, stable, Na 133 09/02/23=133 02/29/24             Anxiety, not sleeping well, prn Lorazepamavailable to her, failed Mirtazapine,  Lexapro, Ambien .              GERD, stable, wants to be off Prilosec,  Hgb 13.6 02/29/24             Constipation, takes MiraLax , Senokot S             OA, left hip, s/p total left hip, multiple sites, takes Tylenol , Aleve , Gabapentin  for pain in toes, failed Methocarbamol  and Meloxicam recommended by Ortho-felt wobbly.              OP off Boniva, Ca, Vit D, declined DEXA, Vit D 16 10/07/21.              TIA, x2, off  Atorvastatin  per patient's request, on ASA              Urinary frequency, prolapsed bladder, chronic,  declined Myrbetriq 2/2 to the cost.      Past Medical History:  Diagnosis Date   Abdominal pain 02/26/2017   Anxiety    Arthritis    Left Knee, Wrist, Back and Hips   Cervical vertebral fusion    Diverticulitis    Diverticulosis    Fibromyalgia    GERD (gastroesophageal reflux disease)    Gout 02/2018   L hand   Hypertension     after d/c from hospital  no current problems or medication   Hypothyroidism    Pelvis fracture (HCC)    Peritoneal free air 03/29/2012   Pneumonia    Pneumoperitoneum 02/26/2017   Pre-diabetes    Seizure (HCC)    Thyroid  disorder    Toe pain 09/06/2016   Past Surgical History:  Procedure Laterality Date   ABDOMINAL HYSTERECTOMY     BOWEL RESECTION N/A 07/09/2017   Procedure: SMALL BOWEL RESECTION;  Surgeon: Jacolyn Matar, MD;  Location: [facility];  Service: General;  Laterality: N/A;   CERVICAL FUSION     x2   CONVERSION TO TOTAL HIP Left 10/31/2020   Procedure: CONVERSION TO ANTERIOR TOTAL HIP;  Surgeon: Claiborne Crew, MD;  Location: [facility];  Service: Orthopedics;  Laterality: Left;  2 hrs   FEMUR IM NAIL Left 03/03/2020   Procedure: INTRAMEDULLARY (IM) NAIL FEMORAL;  Surgeon: Claiborne Crew, MD;  Location: [facility];  Service: Orthopedics;  Laterality: Left;   LAPAROSCOPIC APPENDECTOMY N/A 03/04/2017   Procedure: APPENDECTOMY LAPAROSCOPIC;  Surgeon: Jacolyn Matar, MD;  Location: [facility];  Service: General;  Laterality: N/A;   LAPAROSCOPY N/A  03/04/2017   Procedure: LAPAROSCOPY DIAGNOSTIC, ENTEROLYSIS;  Surgeon: Jacolyn Matar, MD;  Location: [facility];  Service: General;  Laterality: N/A;   LAPAROTOMY N/A 07/09/2017   Procedure: EXPLORATORY LAPAROTOMY;  Surgeon: Jacolyn Matar, MD;  Location: [facility];  Service: General;  Laterality: N/A;   SPINE SURGERY     Lumbar- rod placement   TONSILLECTOMY     89y/o   TOTAL VAGINAL HYSTERECTOMY      Allergies  Allergen Reactions   Codeine Rash   Penicillins Rash    Has patient had a PCN reaction causing immediate rash, facial/tongue/throat swelling, SOB or lightheadedness with hypotension: No Has patient had a PCN reaction causing severe rash involving mucus membranes or skin necrosis: No Has patient had a PCN reaction that required hospitalization: No Has patient had a PCN reaction occurring within the last 10 years: No If all of the above answers are NO, then may proceed with Cephalosporin use.  Tolerated Cephalosporin 10/31/20     Levaquin [Levofloxacin]     Allergies as of 05/10/2024       Reactions   Codeine Rash   Penicillins Rash   Has patient had a PCN reaction causing immediate rash, facial/tongue/throat swelling, SOB or lightheadedness with hypotension: No Has patient had a PCN reaction causing severe rash involving mucus membranes or skin necrosis: No Has patient had a PCN reaction that required hospitalization: No Has patient had a PCN reaction occurring within the last 10 years: No If all of the above answers are NO, then may proceed with Cephalosporin use. Tolerated Cephalosporin 10/31/20   Levaquin [levofloxacin]         Medication List        Accurate as of May 10, 2024 12:58 PM. If you have any questions, ask your nurse or doctor.          acetaminophen  500 MG tablet Commonly known as: TYLENOL  Take 1,000 mg by mouth 2 (two) times daily.   aspirin  EC 81 MG tablet Take 81 mg by mouth daily. Swallow whole.   cyanocobalamin 1000 MCG  tablet Commonly known as: VITAMIN B12 Take 1,000 mcg by mouth daily.   escitalopram 5 MG tablet Commonly known as: LEXAPRO Take 5 mg by mouth daily.   furosemide  20 MG tablet Commonly known as: LASIX  Take 20 mg by mouth every other day.   FUROSEMIDE  PO Take 30  mg by mouth every other day. See other entry also   gabapentin  100 MG capsule Commonly known as: NEURONTIN  Take 1 capsule (100 mg total) by mouth at bedtime.   ibandronate 150 MG tablet Commonly known as: BONIVA Take 150 mg by mouth every 30 (thirty) days. Take in the morning with a full glass of water , on an empty stomach, and do not take anything else by mouth or lie down for the next 30 min.   ibuprofen  200 MG tablet Commonly known as: ADVIL  Take 200 mg by mouth daily as needed.   levETIRAcetam  500 MG 24 hr tablet Commonly known as: Keppra  XR Take 1 tablet (500 mg total) by mouth daily.   levothyroxine  75 MCG tablet Commonly known as: SYNTHROID  Take 1 tablet (75 mcg total) by mouth daily before breakfast.   LORazepam  0.5 MG tablet Commonly known as: ATIVAN  Take 0.25 mg by mouth every 8 (eight) hours as needed for anxiety.   LORazepam  0.5 MG tablet Commonly known as: Ativan  Take 0.5 tablets (0.25 mg total) by mouth 2 (two) times daily as needed for anxiety.   MELATONIN GUMMIES PO Take 5 mg by mouth at bedtime.   mupirocin  ointment 2 % Commonly known as: BACTROBAN  Apply 1 Application topically as directed. Apply to irritated area topically as needed for irritation May keep at bedside to self administer twice daily as needed   Olopatadine HCl 0.7 % Soln Apply to eye daily as needed (One drop to each eye).   Oyster Shell 500 MG Tabs Take 1 tablet by mouth daily.   polyethylene glycol 17 g packet Commonly known as: MIRALAX  / GLYCOLAX  Take 17 g by mouth daily as needed.   sennosides-docusate sodium  8.6-50 MG tablet Commonly known as: SENOKOT-S Take 1-2 tablets by mouth at bedtime as needed for  constipation.   sodium fluoride  1.1 % Gel dental gel Commonly known as: FLUORISHIELD Place 1 Application onto teeth daily in the afternoon.   vitamin D3 50 MCG (2000 UT) Caps Take 2,000 Units by mouth daily.   zolpidem  5 MG tablet Commonly known as: AMBIEN  Take 5 mg by mouth at bedtime as needed for sleep.        Review of Systems  Constitutional:  Negative for appetite change, fatigue and fever.  HENT:  Positive for hearing loss. Negative for congestion and trouble swallowing.   Eyes:  Negative for redness and visual disturbance.  Respiratory:  Negative for cough, shortness of breath and wheezing.        DOE  Cardiovascular:  Positive for leg swelling.  Gastrointestinal:  Negative for abdominal pain and constipation.  Genitourinary:  Positive for frequency. Negative for dysuria and urgency.       Bathroom trips 3-4x/night  Musculoskeletal:  Positive for arthralgias, back pain and gait problem.       Left hip pain, s/p total left hip arthroplasty. Multiple sites aches in am, better as day goes by. Pain in toes.  Chronic back pain  Skin:        Left 3rd toe sore, warmth, no increased swelling or open wound  Neurological:  Negative for seizures, weakness and headaches.       Restless legs at night.  Memory lapses.   Psychiatric/Behavioral:  Positive for sleep disturbance. Negative for behavioral problems. The patient is not nervous/anxious.     Immunization History  Administered Date(s) Administered   Fluzone Influenza virus vaccine,trivalent (IIV3), split virus 08/11/2018, 08/03/2019   Influenza, High Dose Seasonal PF 09/01/2023  Influenza-Unspecified 09/01/2018, 09/10/2020, 09/18/2021, 09/21/2022   Moderna Covid-19 Fall Seasonal Vaccine 96yrs & older 09/15/2023   Moderna Sars-Covid-2 Vaccination 12/04/2019, 01/01/2020   Pfizer Covid-19 Vaccine Bivalent Booster 46yrs & up 10/01/2022   Pneumococcal Conjugate-13 05/29/2014   Pneumococcal Polysaccharide-23 06/14/2015    Tdap 09/14/2012   Zoster Recombinant(Shingrix) 02/10/2022, 05/11/2022   Zoster, Live 04/30/2004, 04/13/2018, 07/05/2018   Pertinent  Health Maintenance Due  Topic Date Due   DEXA SCAN  05/18/2024 (Originally 10/25/1997)   INFLUENZA VACCINE  06/30/2024      11/04/2020   12:00 PM 04/23/2021   11:34 AM 12/08/2022    9:52 AM 12/11/2022    2:50 PM 05/19/2023    9:23 AM  Fall Risk  Falls in the past year?   0 0 1  Was there an injury with Fall?   0 0 1  Fall Risk Category Calculator   0 0 2  Fall Risk Category (Retired)   Low Low   (RETIRED) Patient Fall Risk Level High fall risk Moderate fall risk Low fall risk Low fall risk   Patient at Risk for Falls Due to   No Fall Risks No Fall Risks History of fall(s);Impaired balance/gait;Impaired mobility  Fall risk Follow up   Falls evaluation completed Falls evaluation completed Falls evaluation completed   Functional Status Survey:    Vitals:   05/10/24 1238  BP: (!) 110/59  Pulse: 77  Resp: 18  SpO2: 97%  Weight: 100 lb 12.8 oz (45.7 kg)   Body mass index is 19.69 kg/m. Physical Exam Constitutional:      Appearance: Normal appearance.  HENT:     Head: Normocephalic and atraumatic.     Mouth/Throat:     Mouth: Mucous membranes are moist.  Eyes:     Extraocular Movements: Extraocular movements intact.     Pupils: Pupils are equal, round, and reactive to light.  Cardiovascular:     Rate and Rhythm: Normal rate and regular rhythm.     Heart sounds: No murmur heard.    Comments: Weak left DP pulse, more symptomatic tingling sensation at night.  Pulmonary:     Effort: Pulmonary effort is normal.     Breath sounds: No rales.     Comments: Central congestion Abdominal:     General: Bowel sounds are normal.     Palpations: Abdomen is soft.     Tenderness: There is no abdominal tenderness.  Musculoskeletal:     Cervical back: Normal range of motion and neck supple.     Right lower leg: No edema.     Left lower leg: No edema.      Comments: mild scoliosis. Hx of back surgeries x3 with left sciatica pain. s/p total left hip replacement. R SIJ pain, positional, palpated, still able to bear weight, walking  Skin:    General: Skin is warm and dry.  Neurological:     General: No focal deficit present.     Mental Status: She is alert and oriented to person, place, and time. Mental status is at baseline.     Gait: Gait abnormal.     Comments: Using walker   Psychiatric:        Mood and Affect: Mood normal.        Behavior: Behavior normal.        Thought Content: Thought content normal.        Judgment: Judgment normal.     Labs reviewed: Recent Labs    09/02/23 0000  NA 133*  K 4.5  CL 99  CO2 26*  BUN 15  CREATININE 0.6  CALCIUM  9.1   Recent Labs    09/02/23 0000  ALBUMIN  3.5   Recent Labs    09/02/23 0000  WBC 4.7  NEUTROABS 2,571.00  HGB 11.6*  HCT 35*  PLT 383   Lab Results  Component Value Date   TSH 1.00 09/02/2023   Lab Results  Component Value Date   HGBA1C 5.8 (H) 10/21/2020   Lab Results  Component Value Date   CHOL 171 06/20/2020   HDL 59 06/20/2020   LDLCALC 94 06/20/2020   TRIG 89 06/20/2020   CHOLHDL 2.9 06/20/2020    Significant Diagnostic Results in last 30 days:  No results found.  Assessment/Plan  Frailty syndrome in geriatric patient still ambulates with walker slowly. Update CBC/diff, CMP/eGFR, wants to add MVI daily.    Venous insufficiency Trace edema BLE, takes Furosemide , 06/20/20 EF 60-65%, Bun/creat 15/0.65 02/29/24  Restless leg syndrome  takes Gabapentin , off Requip. Vit B12 818 03/25/23(taking Vit B12)  Seizures (HCC) saw neurology, presentation of left sided weakness again. EEG showed focal seizure right frontal region. Takes Keppra  since 07/23/20.  Hypothyroidism  taking Levothyroxine , TSH 1.72 03/30/24  Hyponatremia stable, Na 133 09/02/23=133 02/29/24  Insomnia secondary to anxiety not sleeping well, prn Lorazepamavailable to her, failed  Mirtazapine, Lexapro, Ambien .   GERD (gastroesophageal reflux disease) stable, wants to be off Prilosec,  Hgb 13.6 02/29/24  Slow transit constipation Stable, takes MiraLax , Senokot S  Osteoarthritis, multiple sites  left hip, s/p total left hip, multiple sites, takes Tylenol , Aleve , Gabapentin  for pain in toes, failed Methocarbamol  and Meloxicam recommended by Ortho-felt wobbly.   Osteoporosis off Boniva, Ca, Vit D, declined DEXA, Vit D 16 10/07/21.   TIA (transient ischemic attack)  x2, off  Atorvastatin  per patient's request, on ASA    Family/ staff Communication: plan of care reviewed with the patient and charge nurse.   Labs/tests ordered:  CBC/diff, CMP/eGFR

## 2024-05-10 NOTE — Assessment & Plan Note (Signed)
 stable, Na 133 09/02/23=133 02/29/24

## 2024-05-10 NOTE — Assessment & Plan Note (Signed)
 taking Levothyroxine , TSH 1.72 03/30/24

## 2024-05-10 NOTE — Assessment & Plan Note (Signed)
saw neurology, presentation of left sided weakness again. EEG showed focal seizure right frontal region. Takes Keppra since 07/23/20

## 2024-05-10 NOTE — Assessment & Plan Note (Signed)
 Trace edema BLE, takes Furosemide , 06/20/20 EF 60-65%, Bun/creat 15/0.65 02/29/24

## 2024-05-10 NOTE — Assessment & Plan Note (Signed)
Stable, takes MiraLax, Senokot S 

## 2024-05-10 NOTE — Assessment & Plan Note (Signed)
 not sleeping well, prn Lorazepamavailable to her, failed Mirtazapine, Lexapro, Ambien .

## 2024-05-10 NOTE — Assessment & Plan Note (Signed)
x2, off  Atorvastatin per patient's request, on ASA

## 2024-05-10 NOTE — Assessment & Plan Note (Signed)
 stable, wants to be off Prilosec,  Hgb 13.6 02/29/24

## 2024-05-10 NOTE — Assessment & Plan Note (Signed)
 off Boniva, Ca, Vit D, declined DEXA, Vit D 16 10/07/21.

## 2024-05-10 NOTE — Assessment & Plan Note (Signed)
 takes Gabapentin, off Requip. Vit B12 818 03/25/23(taking Vit B12)

## 2024-05-10 NOTE — Assessment & Plan Note (Signed)
 left hip, s/p total left hip, multiple sites, takes Tylenol , Aleve , Gabapentin  for pain in toes, failed Methocarbamol  and Meloxicam recommended by Ortho-felt wobbly.

## 2024-05-11 LAB — HEPATIC FUNCTION PANEL
ALT: 10 U/L (ref 7–35)
AST: 17 (ref 13–35)
Alkaline Phosphatase: 43 (ref 25–125)
Bilirubin, Total: 0.5

## 2024-05-11 LAB — BASIC METABOLIC PANEL WITH GFR
BUN: 16 (ref 4–21)
CO2: 26 — AB (ref 13–22)
Chloride: 94 — AB (ref 99–108)
Creatinine: 0.8 (ref 0.5–1.1)
Glucose: 81
Potassium: 4.7 meq/L (ref 3.5–5.1)
Sodium: 129 — AB (ref 137–147)

## 2024-05-11 LAB — COMPREHENSIVE METABOLIC PANEL WITH GFR
Albumin: 3.7 (ref 3.5–5.0)
Calcium: 10.2 (ref 8.7–10.7)
Globulin: 2.2

## 2024-05-11 LAB — CBC AND DIFFERENTIAL
HCT: 36 (ref 36–46)
Hemoglobin: 11.8 — AB (ref 12.0–16.0)
Neutrophils Absolute: 3569
Platelets: 441 K/uL — AB (ref 150–400)
WBC: 6.1

## 2024-05-11 LAB — CBC: RBC: 3.72 — AB (ref 3.87–5.11)

## 2024-05-12 ENCOUNTER — Non-Acute Institutional Stay: Admitting: Sports Medicine

## 2024-05-12 ENCOUNTER — Encounter: Payer: Self-pay | Admitting: Sports Medicine

## 2024-05-12 DIAGNOSIS — E871 Hypo-osmolality and hyponatremia: Secondary | ICD-10-CM

## 2024-05-12 DIAGNOSIS — I872 Venous insufficiency (chronic) (peripheral): Secondary | ICD-10-CM

## 2024-05-12 DIAGNOSIS — R569 Unspecified convulsions: Secondary | ICD-10-CM

## 2024-05-12 DIAGNOSIS — E039 Hypothyroidism, unspecified: Secondary | ICD-10-CM | POA: Diagnosis not present

## 2024-05-12 DIAGNOSIS — M159 Polyosteoarthritis, unspecified: Secondary | ICD-10-CM

## 2024-05-12 DIAGNOSIS — W19XXXD Unspecified fall, subsequent encounter: Secondary | ICD-10-CM

## 2024-05-12 NOTE — Progress Notes (Signed)
 Provider:  Dr. Tye Gall Location:  [facility] Place of Service:   Assisted living    PCP: Tye Gall, MD Patient Care Team: Tye Gall, MD as PCP - General (Internal Medicine)  Extended Emergency Contact Information Primary Emergency Contact: Norton,Ronald  [place] Home Phone: [phone] Mobile Phone: [phone] Relation: Friend Secondary Emergency Contact: Ami Kail Address: [address]          [address]          [place], [place] [address] [place] Home Phone: [phone] Mobile Phone: [phone] Relation: Son  Goals of Care: Advanced Directive information    12/23/2023   11:25 AM  Advanced Directives  Does Patient Have a Medical Advance Directive? Yes  Type of Advance Directive Living will;Out of facility DNR (pink MOST or yellow form)  Does patient want to make changes to medical advance directive? No - Patient declined  Pre-existing out of facility DNR order (yellow form or pink MOST form) Yellow form placed in chart (order not valid for inpatient use);Pink MOST form placed in chart (order not valid for inpatient use)      No chief complaint on file.      History of Present Illness  89 year old female with a past medical history of TIA, osteoporosis, osteoarthritis, GERD, anxiety, hyponatremia, hypothyroidism, seizure disorder, restless leg syndrome is evaluated for an acute visit for hyponatremia. Patient seen and examined in her room. She is sitting in the wheelchair.  Seems pleasant and comfortable and does not appear to be in distress . She knows her name, oriented to time and place.  She cannot tell me what she had for breakfast this morning. Patient uses walker and wheelchair intermittently.  States she had a fall last week.  She stumbled on the wheelchair.  Denies dizzy, lightheadedness. Patient complains of exertional shortness of breath but denies orthopnea, PND,  chest pain, palpitations. History of osteoarthritis-complains of chronic low back pain.  Describes as tightness in her back.  Denies lower extremity tingling or numbness.     Past Medical History:  Diagnosis Date   Abdominal pain 02/26/2017   Anxiety    Arthritis    Left Knee, Wrist, Back and Hips   Cervical vertebral fusion    Diverticulitis    Diverticulosis    Fibromyalgia    GERD (gastroesophageal reflux disease)    Gout 02/2018   L hand   Hypertension     after d/c from hospital no current problems or medication   Hypothyroidism    Pelvis fracture (HCC)    Peritoneal free air 03/29/2012   Pneumonia    Pneumoperitoneum 02/26/2017   Pre-diabetes    Seizure (HCC)    Thyroid  disorder    Toe pain 09/06/2016   Past Surgical History:  Procedure Laterality Date   ABDOMINAL HYSTERECTOMY     BOWEL RESECTION N/A 07/09/2017   Procedure: SMALL BOWEL RESECTION;  Surgeon: Jacolyn Matar, MD;  Location: [facility];  Service: General;  Laterality: N/A;   CERVICAL FUSION     x2   CONVERSION TO TOTAL HIP Left 10/31/2020   Procedure: CONVERSION TO ANTERIOR TOTAL HIP;  Surgeon: Claiborne Crew, MD;  Location: [facility];  Service: Orthopedics;  Laterality: Left;  2 hrs   FEMUR IM NAIL Left 03/03/2020   Procedure: INTRAMEDULLARY (IM) NAIL FEMORAL;  Surgeon: Claiborne Crew, MD;  Location: [facility];  Service: Orthopedics;  Laterality: Left;   LAPAROSCOPIC APPENDECTOMY N/A 03/04/2017   Procedure: APPENDECTOMY LAPAROSCOPIC;  Surgeon: Jacolyn Matar, MD;  Location: [facility];  Service: General;  Laterality: N/A;   LAPAROSCOPY N/A 03/04/2017   Procedure: LAPAROSCOPY DIAGNOSTIC, ENTEROLYSIS;  Surgeon: Jacolyn Matar, MD;  Location: [facility];  Service: General;  Laterality: N/A;   LAPAROTOMY N/A 07/09/2017   Procedure: EXPLORATORY LAPAROTOMY;  Surgeon: Jacolyn Matar, MD;  Location: [facility];  Service: General;  Laterality: N/A;   SPINE SURGERY     Lumbar- rod placement   TONSILLECTOMY     89y/o   TOTAL VAGINAL  HYSTERECTOMY      reports that she has quit smoking. She has never used smokeless tobacco. She reports that she does not currently use alcohol. She reports that she does not use drugs. Social History   Socioeconomic History   Marital status: Widowed    Spouse name: Not on file   Number of children: 2   Years of education: RN   Highest education level: Not on file  Occupational History   Occupation: Retired   Tobacco Use   Smoking status: Former   Smokeless tobacco: Never   Tobacco comments:    Smoked from age [age]  Vaping Use   Vaping status: Never Used  Substance and Sexual Activity   Alcohol use: Not Currently    Comment: occasional glass of wine   Drug use: No   Sexual activity: Not Currently  Other Topics Concern   Not on file  Social History Narrative   Lives at [facility] independent living    Caffeine use: occasional cup of coffee, 2-3 times a week   Left handed         Diet: Regular      Do you drink/ eat things with caffeine? No      Marital status: Widowed                              What year were you married ? 1986      Do you live in a house, apartment,assistred living, condo, trailer, etc.)? Independent Living Home       Is it one or more stories? 4 th floor      How many persons live in your home ? 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  Yes                            Type & how often 5-6 x WK      ADVANCED DIRECTIVES (Please bring copies)      Do you have a living will? Yes      Do you have a DNR form?   Yes                    If not, do you want to discuss one?       Do you have signed POA?HPOA forms?   Yes              If so, please bring to your appointment      FUNCTIONAL STATUS- To be completed by Spouse / child / Staff       Do you have difficulty bathing or dressing yourself ?  No       Do you have difficulty preparing food or eating ?  No      Do you have difficulty managing your mediation ?No      Do you have difficulty managing your finances ? No      Do you have difficulty affording your medication ? No      Social Drivers of Corporate investment banker Strain: Not on file  Food Insecurity: Not on file  Transportation Needs: Not on file  Physical Activity: Not on file  Stress: Not on file  Social Connections: Not on file  Intimate Partner Violence: Not on file    Functional Status Survey:    Family History  Problem Relation Age of Onset   Asthma Mother    Pulmonary embolism Mother    Macular degeneration Mother    Heart disease Father    Stroke Father    Stroke Paternal Uncle    Breast cancer Maternal Aunt    Macular degeneration Sister    Stroke Sister    Neuropathy Neg Hx     Health Maintenance  Topic Date Due   DTaP/Tdap/Td (2 - Td or Tdap) 09/14/2022   COVID-19 Vaccine (5 - 2024-25 season) 03/15/2024   DEXA SCAN  05/18/2024 (Originally 10/25/1997)   INFLUENZA VACCINE  06/30/2024   Medicare Annual Wellness (AWV)  12/22/2024   Pneumococcal Vaccine: 50+ Years  Completed   Zoster Vaccines- Shingrix  Completed   HPV VACCINES  Aged Out   Meningococcal B Vaccine  Aged Out    Allergies  Allergen Reactions   Codeine Rash   Penicillins Rash    Has patient had a PCN reaction causing immediate rash, facial/tongue/throat swelling, SOB or lightheadedness with hypotension: No Has patient had a PCN reaction causing severe rash involving mucus membranes or skin necrosis: No Has patient had a PCN reaction that required hospitalization: No Has patient had a PCN reaction occurring within the last 10 years: No If all of the above answers are NO, then may proceed with Cephalosporin use.  Tolerated Cephalosporin 10/31/20     Levaquin [Levofloxacin]     Outpatient Encounter Medications as of 05/12/2024  Medication Sig   acetaminophen  (TYLENOL )  500 MG tablet Take 1,000 mg by mouth 2 (two) times daily.   aspirin  EC 81 MG tablet Take 81 mg by mouth daily. Swallow whole.   cyanocobalamin (VITAMIN B12) 1000 MCG tablet Take 1,000 mcg by mouth daily.   escitalopram (LEXAPRO) 5 MG tablet Take 5 mg by mouth daily. (Patient not taking: Reported on 12/23/2023)   furosemide  (LASIX ) 20 MG tablet Take 20 mg by mouth every other day.   FUROSEMIDE  PO Take 30 mg by mouth every other day. See other entry also   gabapentin  (NEURONTIN ) 100 MG capsule Take 1 capsule (100 mg total) by mouth at bedtime.   ibandronate (BONIVA) 150 MG tablet Take 150 mg by mouth every 30 (thirty) days. Take in the morning with a full glass of water , on an empty stomach, and do not take anything else by mouth or lie down for the next 30 min.   ibuprofen  (ADVIL ) 200 MG tablet Take 200 mg by mouth daily as needed.   levETIRAcetam  (KEPPRA  XR) 500 MG 24 hr tablet Take 1 tablet (500 mg total) by mouth daily.   levothyroxine  (SYNTHROID ) 75 MCG tablet Take 1 tablet (75 mcg total) by mouth daily before breakfast.   LORazepam  (ATIVAN ) 0.5 MG tablet Take 0.5 tablets (0.25 mg  total) by mouth 2 (two) times daily as needed for anxiety. (Patient not taking: Reported on 12/23/2023)   LORazepam  (ATIVAN ) 0.5 MG tablet Take 0.25 mg by mouth every 8 (eight) hours as needed for anxiety.   MELATONIN GUMMIES PO Take 5 mg by mouth at bedtime.   mupirocin  ointment (BACTROBAN ) 2 % Apply 1 Application topically as directed. Apply to irritated area topically as needed for irritation May keep at bedside to self administer twice daily as needed   Olopatadine HCl 0.7 % SOLN Apply to eye daily as needed (One drop to each eye).   Oyster Shell 500 MG TABS Take 1 tablet by mouth daily.   polyethylene glycol (MIRALAX  / GLYCOLAX ) 17 g packet Take 17 g by mouth daily as needed.   sennosides-docusate sodium  (SENOKOT-S) 8.6-50 MG tablet Take 1-2 tablets by mouth at bedtime as needed for constipation.   sodium  fluoride  (FLUORISHIELD) 1.1 % GEL dental gel Place 1 Application onto teeth daily in the afternoon.   Vitamin D , Cholecalciferol, 50 MCG (2000 UT) CAPS Take 2,000 Units by mouth daily.    zolpidem  (AMBIEN ) 5 MG tablet Take 5 mg by mouth at bedtime as needed for sleep.   No facility-administered encounter medications on file as of 05/12/2024.    Review of Systems  Constitutional:  Negative for fever.  HENT:  Negative for sore throat.   Respiratory:  Positive for shortness of breath (exertional). Negative for cough and wheezing.   Cardiovascular:  Negative for chest pain, palpitations and leg swelling.  Gastrointestinal:  Negative for abdominal distention, abdominal pain, blood in stool, constipation, diarrhea, nausea and vomiting.  Genitourinary:  Negative for dysuria.  Neurological:  Negative for dizziness.  Psychiatric/Behavioral:  Negative for confusion.    Negative unless indicated in HPI.  There were no vitals filed for this visit. There is no height or weight on file to calculate BMI. BP Readings from Last 3 Encounters:  05/10/24 (!) 110/59  02/25/24 130/69  01/07/24 123/60   Wt Readings from Last 3 Encounters:  05/10/24 100 lb 12.8 oz (45.7 kg)  02/25/24 99 lb 6.4 oz (45.1 kg)  12/23/23 99 lb 3.2 oz (45 kg)   Physical Exam Constitutional:      Appearance: Normal appearance.  HENT:     Head: Normocephalic and atraumatic.   Cardiovascular:     Rate and Rhythm: Normal rate and regular rhythm.  Pulmonary:     Effort: Pulmonary effort is normal. No respiratory distress.     Breath sounds: Normal breath sounds. No wheezing.  Abdominal:     General: Bowel sounds are normal. There is no distension.     Tenderness: There is no abdominal tenderness. There is no guarding or rebound.     Comments:     Musculoskeletal:        General: No swelling.   Neurological:     Mental Status: She is alert. Mental status is at baseline.     Motor: No weakness.     Labs  reviewed: Basic Metabolic Panel: Recent Labs    09/02/23 0000  NA 133*  K 4.5  CL 99  CO2 26*  BUN 15  CREATININE 0.6  CALCIUM  9.1   Liver Function Tests: Recent Labs    09/02/23 0000  ALBUMIN  3.5   No results for input(s): LIPASE, AMYLASE in the last 8760 hours. No results for input(s): AMMONIA in the last 8760 hours. CBC: Recent Labs    09/02/23 0000  WBC 4.7  NEUTROABS  2,571.00  HGB 11.6*  HCT 35*  PLT 383   Cardiac Enzymes: No results for input(s): CKTOTAL, CKMB, CKMBINDEX, TROPONINI in the last 8760 hours. BNP: Invalid input(s): POCBNP Lab Results  Component Value Date   HGBA1C 5.8 (H) 10/21/2020   Lab Results  Component Value Date   TSH 1.00 09/02/2023   Lab Results  Component Value Date   VITAMINB12 1,034 09/02/2023   Lab Results  Component Value Date   FOLATE 19.8 12/01/2017   No results found for: IRON, TIBC, FERRITIN  Imaging and Procedures obtained prior to SNF admission: CT ABDOMEN PELVIS WO CONTRAST Result Date: 04/23/2021 CLINICAL DATA:  89 year old female with abdominal distension and pain. EXAM: CT ABDOMEN AND PELVIS WITHOUT CONTRAST TECHNIQUE: Multidetector CT imaging of the abdomen and pelvis was performed following the standard protocol without IV contrast. COMPARISON:  CT abdomen pelvis dated 02/26/2017. FINDINGS: Evaluation of this exam is limited in the absence of intravenous contrast. Evaluation is also limited due to streak artifact caused by spinal hardware. Lower chest: Bibasilar linear atelectasis/scarring. The visualized lung bases are otherwise clear. No intra-abdominal free air or free fluid. Hepatobiliary: Several small liver cysts. No intrahepatic biliary ductal dilatation. The gallbladder is unremarkable. Pancreas: Unremarkable. No pancreatic ductal dilatation or surrounding inflammatory changes. Spleen: Normal in size without focal abnormality. Adrenals/Urinary Tract: The adrenal glands are unremarkable.  There is no hydronephrosis or nephrolithiasis on either side. The visualized ureters appear unremarkable. The urinary bladder is distended and grossly unremarkable. Stomach/Bowel: Large duodenal diverticula measure up to 5 cm. No definite inflammatory changes. There is moderate stool throughout the colon. There is sigmoid diverticulosis without active inflammatory changes. There is no bowel obstruction. Appendectomy. Vascular/Lymphatic: Mild aortoiliac atherosclerotic disease. The IVC is unremarkable. No portal venous gas. There is no adenopathy. Reproductive: Hysterectomy. No adnexal masses. Other: None Musculoskeletal: Osteopenia with scoliosis and degenerative changes of the spine. Extensive lumbar disc spacers and posterior fusion hardware. The hardware appears intact. There is no acute fracture. Total left hip arthroplasty. IMPRESSION: 1. No acute intra-abdominal or pelvic pathology. 2. Moderate colonic stool burden. No bowel obstruction. 3. Duodenal and sigmoid diverticulosis. 4. Aortic Atherosclerosis (ICD10-I70.0). Electronically Signed   By: Angus Bark M.D.   On: 04/23/2021 14:59    Assessment and Plan Assessment & Plan    Hyponatremia Patient AO x 3 Denies dizzy, lightheadedness As per nursing staff no change in mentation Will start sodium tablets daily BMP next Tuesday Will decrease Lasix  to 20 mg 3 times a week  Shortness of breath with exertion Lower extremity swelling improved Echo  EF 65%  No wall motion abnormalities  On auscultation's lungs clear Will change Lasix  to every other day  Falls Patient reported a fall last week.   Patient declined physical therapy   Osteoarthritis Continue with Tylenol  as needed   History of seizure disorder Continue with Keppra   History of hypothyroidism Continue with the levothyroxine  75 mcg   30 min Total time spent for obtaining history,  performing a medically appropriate examination and evaluation, reviewing the  tests,ordering  tests,  documenting clinical information in the electronic or other health record,  ,care coordination (not separately reported)

## 2024-05-16 LAB — COMPREHENSIVE METABOLIC PANEL WITH GFR: Calcium: 9 (ref 8.7–10.7)

## 2024-05-16 LAB — BASIC METABOLIC PANEL WITH GFR
BUN: 13 (ref 4–21)
CO2: 25 — AB (ref 13–22)
Chloride: 100 (ref 99–108)
Creatinine: 0.6 (ref 0.5–1.1)
Glucose: 82
Potassium: 4.3 meq/L (ref 3.5–5.1)
Sodium: 132 — AB (ref 137–147)

## 2024-06-02 ENCOUNTER — Encounter (HOSPITAL_COMMUNITY): Payer: Self-pay

## 2024-06-02 ENCOUNTER — Other Ambulatory Visit: Payer: Self-pay

## 2024-06-02 ENCOUNTER — Emergency Department (HOSPITAL_COMMUNITY)
Admission: EM | Admit: 2024-06-02 | Discharge: 2024-06-02 | Disposition: A | Discharge status: Skilled Nursing Facility | Source: Skilled Nursing Facility | Attending: Emergency Medicine | Admitting: Emergency Medicine

## 2024-06-02 ENCOUNTER — Emergency Department (HOSPITAL_COMMUNITY)

## 2024-06-02 DIAGNOSIS — S51012A Laceration without foreign body of left elbow, initial encounter: Secondary | ICD-10-CM | POA: Diagnosis not present

## 2024-06-02 DIAGNOSIS — R918 Other nonspecific abnormal finding of lung field: Secondary | ICD-10-CM | POA: Insufficient documentation

## 2024-06-02 DIAGNOSIS — N390 Urinary tract infection, site not specified: Secondary | ICD-10-CM | POA: Diagnosis not present

## 2024-06-02 DIAGNOSIS — W1830XA Fall on same level, unspecified, initial encounter: Secondary | ICD-10-CM | POA: Diagnosis not present

## 2024-06-02 DIAGNOSIS — Z7982 Long term (current) use of aspirin: Secondary | ICD-10-CM | POA: Diagnosis not present

## 2024-06-02 DIAGNOSIS — S61512A Laceration without foreign body of left wrist, initial encounter: Secondary | ICD-10-CM | POA: Insufficient documentation

## 2024-06-02 DIAGNOSIS — S6992XA Unspecified injury of left wrist, hand and finger(s), initial encounter: Secondary | ICD-10-CM | POA: Diagnosis present

## 2024-06-02 DIAGNOSIS — W19XXXA Unspecified fall, initial encounter: Secondary | ICD-10-CM

## 2024-06-02 LAB — URINALYSIS, ROUTINE W REFLEX MICROSCOPIC
Bilirubin Urine: NEGATIVE
Glucose, UA: NEGATIVE mg/dL
Hgb urine dipstick: NEGATIVE
Ketones, ur: NEGATIVE mg/dL
Nitrite: POSITIVE — AB
Protein, ur: NEGATIVE mg/dL
Specific Gravity, Urine: 1.006 (ref 1.005–1.030)
WBC, UA: >50 WBC/hpf (ref 0–5)
pH: 7 (ref 5.0–8.0)

## 2024-06-02 LAB — CBC
HCT: 38.5 % (ref 36.0–46.0)
Hemoglobin: 12.9 g/dL (ref 12.0–15.0)
MCH: 32.5 pg (ref 26.0–34.0)
MCHC: 33.5 g/dL (ref 30.0–36.0)
MCV: 97 fL (ref 80.0–100.0)
Platelets: 385 K/uL (ref 150–400)
RBC: 3.97 MIL/uL (ref 3.87–5.11)
RDW: 12.6 % (ref 11.5–15.5)
WBC: 8.9 K/uL (ref 4.0–10.5)
nRBC: 0 % (ref 0.0–0.2)

## 2024-06-02 LAB — BASIC METABOLIC PANEL WITH GFR
Anion gap: 13 (ref 5–15)
BUN: 16 mg/dL (ref 8–23)
CO2: 24 mmol/L (ref 22–32)
Calcium: 10 mg/dL (ref 8.9–10.3)
Chloride: 97 mmol/L — ABNORMAL LOW (ref 98–111)
Creatinine, Ser: 0.93 mg/dL (ref 0.44–1.00)
GFR, Estimated: 58 mL/min — ABNORMAL LOW (ref >60)
Glucose, Bld: 118 mg/dL — ABNORMAL HIGH (ref 70–99)
Potassium: 3.8 mmol/L (ref 3.5–5.1)
Sodium: 134 mmol/L — ABNORMAL LOW (ref 135–145)

## 2024-06-02 LAB — CK: Total CK: 106 U/L (ref 38–234)

## 2024-06-02 MED ORDER — ACETAMINOPHEN 500 MG PO TABS
1000.0000 mg | ORAL_TABLET | Freq: Once | ORAL | Status: AC
  Administered 2024-06-02: 1000 mg via ORAL
  Filled 2024-06-02: qty 2

## 2024-06-02 MED ORDER — CEPHALEXIN 500 MG PO CAPS
500.0000 mg | ORAL_CAPSULE | Freq: Once | ORAL | Status: AC
  Administered 2024-06-02: 500 mg via ORAL
  Filled 2024-06-02: qty 1

## 2024-06-02 MED ORDER — CEPHALEXIN 500 MG PO CAPS
500.0000 mg | ORAL_CAPSULE | Freq: Two times a day (BID) | ORAL | 0 refills | Status: AC
Start: 2024-06-02 — End: 2024-06-09

## 2024-06-02 NOTE — ED Notes (Addendum)
 [facility] on unit to transport pt back to facility; documents from facility given to [facility] for transport

## 2024-06-02 NOTE — ED Notes (Signed)
 [facility] called for transport.

## 2024-06-02 NOTE — ED Notes (Signed)
 Pt to imaging

## 2024-06-02 NOTE — Discharge Instructions (Addendum)
 Follow-up with your primary care provider this week.  Start Keflex , 1 tablet twice daily for 7 days, you received your first dose of this medication in the emergency department this evening.   You experienced 2 skin tears on your left arm from your fall, these were repaired using Steri-Strips.  Please keep the area clean and dry for the first 24 hours, do not soak these wounds, your Steri-Strips will likely start to peel off on their own in 5 to 10 days. An opacity of your right lung was noted on chest x-ray, discuss repeat imaging with your primary care provider for follow-up on this issue.

## 2024-06-02 NOTE — ED Triage Notes (Addendum)
 Pt to ED via [facility] from [facility] , C/O unwitnessed fall., mechanical fall, pt fell on face. Laceration to left elbow, left wrist, pt nose was bleeding, but stopped on its own. Pt not on blood thinner. No LOC. No medications given by EMS. A&O X 4. No deformity noted by EMS.   Of note, DNR form accompanies pt,   Abdominal distention noted, which per EMS was told pt baseline.   Last VS: 136/78, hr 97, 92%RA, 17

## 2024-06-02 NOTE — ED Provider Notes (Signed)
 [place] EMERGENCY DEPARTMENT AT [facility] Provider Note   CSN: [id number] Arrival date & time: 06/02/24  1552     Patient presents with: Sara Davila is a 89 y.o. female.   89 year old female presenting after a fall.  Patient lives in an assisted living facility, was using her walker when she fell against the wall, causing her to slide down the wall onto her buttocks, she believes that she was on the ground for 15 to 20 minutes however her fall was not witnessed.  She reports falling primarily against her left side, somehow striking her face causing a nosebleed.  She is on aspirin , denies loss of consciousness.   Fall       Prior to Admission medications   Medication Sig Start Date End Date Taking? Authorizing Provider  acetaminophen  (TYLENOL ) 500 MG tablet Take 1,000 mg by mouth 2 (two) times daily.    [provider]  aspirin  EC 81 MG tablet Take 81 mg by mouth daily. Swallow whole.    [provider]  cyanocobalamin (VITAMIN B12) 1000 MCG tablet Take 1,000 mcg by mouth daily.    [provider]  escitalopram (LEXAPRO) 5 MG tablet Take 5 mg by mouth daily. Patient not taking: Reported on 12/23/2023    [provider]  furosemide  (LASIX ) 20 MG tablet Take 20 mg by mouth every other day.    [provider]  FUROSEMIDE  PO Take 30 mg by mouth every other day. See other entry also    [provider]  gabapentin  (NEURONTIN ) 100 MG capsule Take 1 capsule (100 mg total) by mouth at bedtime. 04/27/22   Ines Onetha NOVAK, MD  ibandronate (BONIVA) 150 MG tablet Take 150 mg by mouth every 30 (thirty) days. Take in the morning with a full glass of water , on an empty stomach, and do not take anything else by mouth or lie down for the next 30 min.    [provider]  ibuprofen  (ADVIL ) 200 MG tablet Take 200 mg by mouth daily as needed.    [provider]  levETIRAcetam  (KEPPRA  XR) 500 MG 24 hr tablet  Take 1 tablet (500 mg total) by mouth daily. 04/27/22   Ines Onetha NOVAK, MD  levothyroxine  (SYNTHROID ) 75 MCG tablet Take 1 tablet (75 mcg total) by mouth daily before breakfast. 06/15/20   Charlanne Fredia CROME, MD  LORazepam  (ATIVAN ) 0.5 MG tablet Take 0.5 tablets (0.25 mg total) by mouth 2 (two) times daily as needed for anxiety. Patient not taking: Reported on 12/23/2023 08/25/22   Mast, Man X, NP  LORazepam  (ATIVAN ) 0.5 MG tablet Take 0.25 mg by mouth every 8 (eight) hours as needed for anxiety.    [provider]  MELATONIN GUMMIES PO Take 5 mg by mouth at bedtime.    [provider]  mupirocin  ointment (BACTROBAN ) 2 % Apply 1 Application topically as directed. Apply to irritated area topically as needed for irritation May keep at bedside to self administer twice daily as needed    [provider]  Olopatadine HCl 0.7 % SOLN Apply to eye daily as needed (One drop to each eye).    [provider]  Oyster Shell 500 MG TABS Take 1 tablet by mouth daily.    [provider]  polyethylene glycol (MIRALAX  / GLYCOLAX ) 17 g packet Take 17 g by mouth daily as needed.    [provider]  sennosides-docusate sodium  (SENOKOT-S) 8.6-50 MG tablet Take 1-2  tablets by mouth at bedtime as needed for constipation.    [provider]  sodium fluoride  (FLUORISHIELD) 1.1 % GEL dental gel Place 1 Application onto teeth daily in the afternoon.    [provider]  Vitamin D , Cholecalciferol, 50 MCG (2000 UT) CAPS Take 2,000 Units by mouth daily.     [provider]  zolpidem  (AMBIEN ) 5 MG tablet Take 5 mg by mouth at bedtime as needed for sleep.    [provider]    Allergies: Codeine, Penicillins, and Levaquin [levofloxacin]    Review of Systems  Updated Vital Signs  Vitals:   06/02/24 1730 06/02/24 1800 06/02/24 1815 06/02/24 1930  BP: (!) 151/82 (!) 156/92 (!) 154/80 (!) 147/95  Pulse: 93 (!) 59 99 100  Resp: 15 11 19 16    Temp:      TempSrc:      SpO2:  94% 94% 92%  Weight:      Height:         Physical Exam Vitals and nursing note reviewed.  HENT:     Head: Normocephalic.     Comments: Minimal amount of dried blood in nares bilaterally, no active nosebleed No TTP of head, no contusions or lacerations of head Eyes:     Extraocular Movements: Extraocular movements intact.     Pupils: Pupils are equal, round, and reactive to light.  Cardiovascular:     Rate and Rhythm: Normal rate.  Pulmonary:     Effort: Pulmonary effort is normal.  Abdominal:     Palpations: Abdomen is soft.     Tenderness: There is no abdominal tenderness. There is no guarding.  Musculoskeletal:     Cervical back: Normal range of motion.     Comments: Full ROM of bilateral upper and lower extremities Mild TTP of posterior aspect of L shoulder No TTP of bilateral lower extremities at ankles/knees Mild TTP of pelvis, L>R  Skin:    General: Skin is warm and dry.     Comments: Skin tear to ulnar styloid of L wrist Skin tear to L elbow  Neurological:     Mental Status: She is alert and oriented to person, place, and time.     Sensory: No sensory deficit.     Motor: No weakness.     (all labs ordered are listed, but only abnormal results are displayed) Labs Reviewed  BASIC METABOLIC PANEL WITH GFR - Abnormal; Notable for the following components:      Result Value   Sodium 134 (*)    Chloride 97 (*)    Glucose, Bld 118 (*)    GFR, Estimated 58 (*)    All other components within normal limits  URINALYSIS, ROUTINE W REFLEX MICROSCOPIC - Abnormal; Notable for the following components:   APPearance HAZY (*)    Nitrite POSITIVE (*)    Leukocytes,Ua LARGE (*)    Bacteria, UA RARE (*)    All other components within normal limits  URINE CULTURE  CBC  CK    EKG: None  Radiology: DG Pelvis Portable Result Date: 06/02/2024 CLINICAL DATA:  Unwitnessed fall EXAM: PORTABLE PELVIS 1-2 VIEWS COMPARISON:  10/31/2020  FINDINGS: Extensive hardware in the lumbar and upper sacral spine. Left hip replacement with normal alignment. Pubic symphysis and rami appear intact. SI joints are non widened. No acute fracture. Moderate advanced right femoroacetabular degenerative changes IMPRESSION: No acute osseous abnormality. Left hip replacement. Electronically Signed   By: Luke Bun M.D.   On: 06/02/2024 17:24  DG Chest Portable 1 View Result Date: 06/02/2024 CLINICAL DATA:  Fall EXAM: PORTABLE CHEST 1 VIEW COMPARISON:  07/18/2020 FINDINGS: Hardware in the cervical spine. Calcified hilar nodes and calcified lung granulomas. No acute pleural effusion or pneumothorax. Small vague right lower lung opacity. Stable cardiomediastinal silhouette. IMPRESSION: Prior granulomatous disease. Small vague focus of opacity in the right lower lung indeterminate for infection or inflammation, suggest short interval two-view chest radiograph follow-up Electronically Signed   By: Luke Bun M.D.   On: 06/02/2024 17:23   DG Elbow 2 Views Left Result Date: 06/02/2024 CLINICAL DATA:  Fall with laceration EXAM: LEFT ELBOW - 2 VIEW COMPARISON:  None Available. FINDINGS: No definitive fracture or malalignment. No significant elbow effusion IMPRESSION: No acute osseous abnormality. Electronically Signed   By: Luke Bun M.D.   On: 06/02/2024 17:21   DG Wrist Complete Left Result Date: 06/02/2024 CLINICAL DATA:  Fall EXAM: LEFT WRIST - COMPLETE 3+ VIEW COMPARISON:  None Available. FINDINGS: Limited by habitus. Osteopenia. No gross fracture or dislocation. Severe arthritis at the first [facility] joint and STT interval. Advanced radiocarpal degenerative changes. Chondro calcinosis. IMPRESSION: 1. Limited by habitus. No gross acute osseous abnormality. 2. 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 Electronically Signed   By: Luke Bun M.D.   On: 06/02/2024 17:20   DG Shoulder Left Portable Result Date: 06/02/2024 CLINICAL DATA:  Mechanical fall EXAM: LEFT SHOULDER  COMPARISON:  None Available. FINDINGS: No fracture or malalignment. Mild AC joint degenerative change. Mild calcific tendinopathy IMPRESSION: No acute osseous abnormality. Electronically Signed   By: Luke Bun M.D.   On: 06/02/2024 17:18   CT Head Wo Contrast Result Date: 06/02/2024 CLINICAL DATA:  Unwitnessed fall, fall on face, laceration EXAM: CT HEAD WITHOUT CONTRAST CT MAXILLOFACIAL WITHOUT CONTRAST CT CERVICAL SPINE WITHOUT CONTRAST TECHNIQUE: Multidetector CT imaging of the head, cervical spine, and maxillofacial structures were performed using the standard protocol without intravenous contrast. Multiplanar CT image reconstructions of the cervical spine and maxillofacial structures were also generated. RADIATION DOSE REDUCTION: This exam was performed according to the departmental dose-optimization program which includes automated exposure control, adjustment of the mA and/or kV according to patient size and/or use of iterative reconstruction technique. COMPARISON:  07/11/2020 FINDINGS: CT HEAD FINDINGS Brain: No evidence of acute infarction, hemorrhage, hydrocephalus, extra-axial collection or mass lesion/mass effect. Periventricular white matter hypodensity. Vascular: No hyperdense vessel or unexpected calcification. CT FACIAL BONES FINDINGS Skull: Normal. Negative for fracture or focal lesion. Facial bones: No displaced fractures or dislocations. Sinuses/Orbits: No acute finding. Other: None. CT CERVICAL SPINE FINDINGS Alignment: Degenerative and postoperative straightening of the normal cervical lordosis. Degenerative anterolisthesis of C7 on T1 Skull base and vertebrae: No acute fracture. No primary bone lesion or focal pathologic process. Soft tissues and spinal canal: No prevertebral fluid or swelling. No visible canal hematoma. Disc levels: Anterior cervical discectomy and fusion of C4-C5 with ankylosis of C5-C6. Severe disc degenerative change of the remaining cervical levels. Upper chest:  Negative. Other: None. IMPRESSION: 1. No acute intracranial pathology. 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 2. No displaced fractures or dislocations of the facial bones. 3. No fracture or static subluxation of the cervical spine. 4. Anterior cervical discectomy and fusion of C4-C5 with ankylosis of C5-C6. Severe disc degenerative change of the remaining cervical levels. Electronically Signed   By: Marolyn JONETTA Jaksch M.D.   On: 06/02/2024 17:03   CT Cervical Spine Wo Contrast Result Date: 06/02/2024 CLINICAL DATA:  Unwitnessed fall, fall on  face, laceration EXAM: CT HEAD WITHOUT CONTRAST CT MAXILLOFACIAL WITHOUT CONTRAST CT CERVICAL SPINE WITHOUT CONTRAST TECHNIQUE: Multidetector CT imaging of the head, cervical spine, and maxillofacial structures were performed using the standard protocol without intravenous contrast. Multiplanar CT image reconstructions of the cervical spine and maxillofacial structures were also generated. RADIATION DOSE REDUCTION: This exam was performed according to the departmental dose-optimization program which includes automated exposure control, adjustment of the mA and/or kV according to patient size and/or use of iterative reconstruction technique. COMPARISON:  07/11/2020 FINDINGS: CT HEAD FINDINGS Brain: No evidence of acute infarction, hemorrhage, hydrocephalus, extra-axial collection or mass lesion/mass effect. Periventricular white matter hypodensity. Vascular: No hyperdense vessel or unexpected calcification. CT FACIAL BONES FINDINGS Skull: Normal. Negative for fracture or focal lesion. Facial bones: No displaced fractures or dislocations. Sinuses/Orbits: No acute finding. Other: None. CT CERVICAL SPINE FINDINGS Alignment: Degenerative and postoperative straightening of the normal cervical lordosis. Degenerative anterolisthesis of C7 on T1 Skull base and vertebrae: No acute fracture. No primary bone lesion or focal pathologic process. Soft tissues and  spinal canal: No prevertebral fluid or swelling. No visible canal hematoma. Disc levels: Anterior cervical discectomy and fusion of C4-C5 with ankylosis of C5-C6. Severe disc degenerative change of the remaining cervical levels. Upper chest: Negative. Other: None. IMPRESSION: 1. No acute intracranial pathology. 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 2. No displaced fractures or dislocations of the facial bones. 3. No fracture or static subluxation of the cervical spine. 4. Anterior cervical discectomy and fusion of C4-C5 with ankylosis of C5-C6. Severe disc degenerative change of the remaining cervical levels. Electronically Signed   By: Marolyn JONETTA Jaksch M.D.   On: 06/02/2024 17:03   CT Maxillofacial Wo Contrast Result Date: 06/02/2024 CLINICAL DATA:  Unwitnessed fall, fall on face, laceration EXAM: CT HEAD WITHOUT CONTRAST CT MAXILLOFACIAL WITHOUT CONTRAST CT CERVICAL SPINE WITHOUT CONTRAST TECHNIQUE: Multidetector CT imaging of the head, cervical spine, and maxillofacial structures were performed using the standard protocol without intravenous contrast. Multiplanar CT image reconstructions of the cervical spine and maxillofacial structures were also generated. RADIATION DOSE REDUCTION: This exam was performed according to the departmental dose-optimization program which includes automated exposure control, adjustment of the mA and/or kV according to patient size and/or use of iterative reconstruction technique. COMPARISON:  07/11/2020 FINDINGS: CT HEAD FINDINGS Brain: No evidence of acute infarction, hemorrhage, hydrocephalus, extra-axial collection or mass lesion/mass effect. Periventricular white matter hypodensity. Vascular: No hyperdense vessel or unexpected calcification. CT FACIAL BONES FINDINGS Skull: Normal. Negative for fracture or focal lesion. Facial bones: No displaced fractures or dislocations. Sinuses/Orbits: No acute finding. Other: None. CT CERVICAL SPINE FINDINGS  Alignment: Degenerative and postoperative straightening of the normal cervical lordosis. Degenerative anterolisthesis of C7 on T1 Skull base and vertebrae: No acute fracture. No primary bone lesion or focal pathologic process. Soft tissues and spinal canal: No prevertebral fluid or swelling. No visible canal hematoma. Disc levels: Anterior cervical discectomy and fusion of C4-C5 with ankylosis of C5-C6. Severe disc degenerative change of the remaining cervical levels. Upper chest: Negative. Other: None. IMPRESSION: 1. No acute intracranial pathology. 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 2. No displaced fractures or dislocations of the facial bones. 3. No fracture or static subluxation of the cervical spine. 4. Anterior cervical discectomy and fusion of C4-C5 with ankylosis of C5-C6. Severe disc degenerative change of the remaining cervical levels. Electronically Signed   By: Marolyn JONETTA Jaksch M.D.   On: 06/02/2024 17:03     .  Laceration Repair  Date/Time: 06/02/2024 6:09 PM  Performed by: Glendia Rocky SAILOR, PA-C Authorized by: Glendia Rocky SAILOR, PA-C   Consent:    Consent obtained:  Verbal   Consent given by:  Patient   Risks, benefits, and alternatives were discussed: yes     Risks discussed:  Infection, poor cosmetic result, retained foreign body, pain, need for additional repair, nerve damage, poor wound healing, vascular damage and tendon damage   Alternatives discussed:  No treatment Universal protocol:    Procedure explained and questions answered to patient or proxy's satisfaction: yes     Patient identity confirmed:  Verbally with patient Anesthesia:    Anesthesia method:  None Laceration details:    Location:  Shoulder/arm   Shoulder/arm location:  L elbow   Length (cm):  2 Treatment:    Wound cleansed with: wound spray.   Amount of cleaning:  Standard   Visualized foreign bodies/material removed: no   Skin repair:    Repair method:  Steri-Strips   Number  of Steri-Strips:  2 Approximation:    Approximation:  Close Repair type:    Repair type:  Simple Post-procedure details:    Dressing:  Open (no dressing)   Procedure completion:  Tolerated well, no immediate complications .Laceration Repair  Date/Time: 06/02/2024 6:10 PM  Performed by: Glendia Rocky SAILOR, PA-C Authorized by: Glendia Rocky SAILOR, PA-C   Consent:    Consent obtained:  Verbal   Consent given by:  Patient   Risks, benefits, and alternatives were discussed: yes     Risks discussed:  Infection, pain, retained foreign body, tendon damage, poor cosmetic result, poor wound healing, vascular damage, need for additional repair and nerve damage   Alternatives discussed:  No treatment Universal protocol:    Procedure explained and questions answered to patient or proxy's satisfaction: yes     Patient identity confirmed:  Verbally with patient Anesthesia:    Anesthesia method:  None Laceration details:    Location:  Hand   Hand location:  L wrist   Length (cm):  1 Treatment:    Wound cleansed with: wound spray.   Amount of cleaning:  Standard   Visualized foreign bodies/material removed: no   Skin repair:    Repair method:  Steri-Strips   Number of Steri-Strips:  2 Approximation:    Approximation:  Close Repair type:    Repair type:  Simple Post-procedure details:    Dressing:  Open (no dressing)   Procedure completion:  Tolerated well, no immediate complications    Medications Ordered in the ED  acetaminophen  (TYLENOL ) tablet 1,000 mg (has no administration in time range)  cephALEXin  (KEFLEX ) capsule 500 mg (500 mg Oral Given 06/02/24 1749)                                    Medical Decision Making This patient presents to the ED for concern of fall, this involves an extensive number of treatment options, and is a complaint that carries with it a high risk of complications and morbidity.  The differential diagnosis includes fracture, dislocation, contusion, intracranial  hemorrhage, laceration.   Co morbidities that complicate the patient evaluation  Osteoarthritis, hypothyroidism, on aspirin    Additional history obtained:  Additional history obtained from record review External records from outside source obtained and reviewed including prior PCP notes   Lab Tests:  I Ordered, and personally interpreted labs.  The pertinent results include:  CBC within normal limits, BMP unremarkable.  Urinalysis notable for positive nitrites with large leukocytes, consistent with urinary tract infection. I have no prior urine cultures for reference, will send for culture. CK unremarkable at 106.    Imaging Studies ordered:  I ordered imaging studies including  - CT head - CT C-spine -CT maxillofacial - Chest x-ray -Left shoulder x-ray - Left elbow x-ray - Left wrist x-ray -Pelvis x-ray I independently visualized and interpreted imaging which showed  - CT head/CT C-spine/CT maxillofacial: 1. No acute intracranial pathology. 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 2. No displaced fractures or dislocations of the facial bones. 3. No fracture or static subluxation of the cervical spine. 4. Anterior cervical discectomy and fusion of C4-C5 with ankylosis of C5-C6. Severe disc degenerative change of the remaining cervical levels. - Chest x-ray: Prior granulomatous disease. Small vague focus of opacity in the right lower lung indeterminate for infection or inflammation, suggest short interval two-view chest radiograph follow-up -Left shoulder x-ray: No acute osseous abnormality. - Left elbow x-ray: No acute osseous abnormality. - Left wrist x-ray: 1. Limited by habitus. No gross acute osseous abnormality.2. 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 -Pelvis x-ray: No acute osseous abnormality. Left hip replacement.   I agree with the radiologist interpretation   Cardiac Monitoring: / EKG:  The patient was maintained on a cardiac monitor.  I  personally viewed and interpreted the cardiac monitored which showed an underlying rhythm of: NSR   Problem List / ED Course / Critical interventions / Medication management  I ordered medication including Keflex  for UTI, Tylenol  for pain Reevaluation of the patient after these medicines showed that the patient stayed the same I have reviewed the patients home medicines and have made adjustments as needed   Social Determinants of Health:  Former tobacco use   Test / Admission - Considered:  Physical exam notable as above, see detailed report.  Patient had mild skin tears to left elbow and wrist, these were repaired with Steri-Strips as above.  Labs are notable for urinary tract infection with positive nitrites and leukocytes, will send for culture.  Patient denies symptoms of urinary tract infection including dysuria/hematuria.  First dose of Keflex  given in the emergency department this evening, will prescribe 7-day course of Keflex  for urinary tract infection.  Chest x-ray notable for right vague lung opacity, suspicious for infection versus inflammation, patient denies fever/cough/shortness of breath/chest pain, I have low suspicion that this is an infectious process at this time based on her assessment.  I recommend that the patient follow-up with a short-interval two-view chest x-ray in the outpatient setting in accordance with the recommendation from the radiologic interpretation.  Otherwise, imaging and labs were largely reassuring.  I discussed these findings and patient is in agreement with this plan.  Return precautions discussed, she is appropriate discharge at this time.  Staffed with Dr. Patsey     Amount and/or Complexity of Data Reviewed Labs: ordered. Radiology: ordered.  Risk OTC drugs. Prescription drug management.        Final diagnoses:  Fall, initial encounter  Urinary tract infection without hematuria, site unspecified  Opacity of lung on imaging study     ED Discharge Orders          Ordered    cephALEXin  (KEFLEX ) 500 MG capsule  2 times daily        06/02/24 1828               Glendia, Rocky  N, PA-C 06/02/24 1945    Patsey Lot, MD 06/02/24 [phone]

## 2024-06-02 NOTE — ED Notes (Addendum)
 This RN attempted to call nursing report to [facility] informed that the nurse was busy and would call this RN back, phone number left, awaiting call back

## 2024-06-04 LAB — URINE CULTURE: Culture: >=100000 — AB

## 2024-06-05 ENCOUNTER — Telehealth (HOSPITAL_BASED_OUTPATIENT_CLINIC_OR_DEPARTMENT_OTHER): Payer: Self-pay | Admitting: *Deleted

## 2024-06-05 ENCOUNTER — Encounter: Payer: Self-pay | Admitting: Nurse Practitioner

## 2024-06-05 ENCOUNTER — Non-Acute Institutional Stay: Admitting: Nurse Practitioner

## 2024-06-05 DIAGNOSIS — E871 Hypo-osmolality and hyponatremia: Secondary | ICD-10-CM

## 2024-06-05 DIAGNOSIS — I872 Venous insufficiency (chronic) (peripheral): Secondary | ICD-10-CM | POA: Diagnosis not present

## 2024-06-05 DIAGNOSIS — F5105 Insomnia due to other mental disorder: Secondary | ICD-10-CM

## 2024-06-05 DIAGNOSIS — W19XXXA Unspecified fall, initial encounter: Secondary | ICD-10-CM | POA: Diagnosis not present

## 2024-06-05 DIAGNOSIS — N390 Urinary tract infection, site not specified: Secondary | ICD-10-CM

## 2024-06-05 DIAGNOSIS — M8000XS Age-related osteoporosis with current pathological fracture, unspecified site, sequela: Secondary | ICD-10-CM

## 2024-06-05 DIAGNOSIS — K5901 Slow transit constipation: Secondary | ICD-10-CM

## 2024-06-05 DIAGNOSIS — R350 Frequency of micturition: Secondary | ICD-10-CM

## 2024-06-05 DIAGNOSIS — R5381 Other malaise: Secondary | ICD-10-CM | POA: Diagnosis not present

## 2024-06-05 DIAGNOSIS — K219 Gastro-esophageal reflux disease without esophagitis: Secondary | ICD-10-CM

## 2024-06-05 DIAGNOSIS — G2581 Restless legs syndrome: Secondary | ICD-10-CM

## 2024-06-05 DIAGNOSIS — E039 Hypothyroidism, unspecified: Secondary | ICD-10-CM

## 2024-06-05 DIAGNOSIS — R569 Unspecified convulsions: Secondary | ICD-10-CM

## 2024-06-05 DIAGNOSIS — M150 Primary generalized (osteo)arthritis: Secondary | ICD-10-CM

## 2024-06-05 DIAGNOSIS — Z8673 Personal history of transient ischemic attack (TIA), and cerebral infarction without residual deficits: Secondary | ICD-10-CM

## 2024-06-05 DIAGNOSIS — F419 Anxiety disorder, unspecified: Secondary | ICD-10-CM

## 2024-06-05 NOTE — Assessment & Plan Note (Signed)
 stable, Na 134 06/02/24 at baseline, declined Nacl in the past.

## 2024-06-05 NOTE — Assessment & Plan Note (Signed)
 takes Gabapentin, off Requip. Vit B12 818 03/25/23(taking Vit B12)

## 2024-06-05 NOTE — Assessment & Plan Note (Signed)
 UA 06/02/24 ED large leukocytosis, positive nitrite, wbc>50, culture 90,000c/ml, KLEBSIELLA PNEUMONIAE Abnormal  susceptible to cephalosporins.

## 2024-06-05 NOTE — Assessment & Plan Note (Signed)
x2, off  Atorvastatin per patient's request, on ASA

## 2024-06-05 NOTE — Assessment & Plan Note (Signed)
 off Boniva, Ca, Vit D, declined DEXA, Vit D 16 10/07/21.

## 2024-06-05 NOTE — Assessment & Plan Note (Signed)
Stable, takes MiraLax, Senokot S 

## 2024-06-05 NOTE — Assessment & Plan Note (Signed)
 ED eval 06/02/24 for fall, mechanical, resulted peri mouth contusion, pending dental eval. Left elbow/wrist skin tears. CT head/neck/maxilofacial, Xray chest, left  shoulder/elbow/wrist, pelvis no acute processes.  Continue Steri-Strips for the wound, monitor for signs symptoms of infection

## 2024-06-05 NOTE — Assessment & Plan Note (Signed)
Urinary frequency, prolapsed bladder, chronic,  declined Myrbetriq 2/2 to the cost.

## 2024-06-05 NOTE — Assessment & Plan Note (Signed)
 taking Levothyroxine , TSH 1.72 03/30/24

## 2024-06-05 NOTE — Assessment & Plan Note (Signed)
 left hip, s/p total left hip, multiple sites, takes Tylenol , Aleve , Gabapentin  for pain in toes, failed Methocarbamol  and Meloxicam recommended by Ortho-felt wobbly.

## 2024-06-05 NOTE — Telephone Encounter (Signed)
 Post ED Visit - Positive Culture Follow-up  Culture report reviewed by antimicrobial stewardship pharmacist: [facility] []  Rankin Dee, Pharm.D. []  Venetia Gully, [address].D., BCPS AQ-ID []  Garrel Crews, Pharm.D., BCPS [x]  Almarie Lunger, Pharm.D., BCPS []  [place], [place].D., BCPS, AAHIVP []  Rosaline Bihari, Pharm.D., BCPS, AAHIVP []  Vernell Meier, PharmD, BCPS []  Latanya Hint, PharmD, BCPS []  Donald Medley, PharmD, BCPS []  Rocky Bold, PharmD []  Dorothyann Alert, PharmD, BCPS []  Morene Babe, PharmD  [facility] []  Rosaline Edison, PharmD []  Romona Bliss, PharmD []  Dolphus Roller, PharmD []  Veva Seip, Rph []  Vernell Daunt) Leonce, PharmD []  Eva Allis, PharmD []  Rosaline Millet, PharmD []  Iantha Batch, PharmD []  Arvin Gauss, PharmD []  Wanda Hasting, PharmD []  Ronal Rav, PharmD []  Rocky Slade, PharmD []  Bard Jeans, PharmD   Positive urine culture Treated with cephalexin , organism sensitive to the same and no further patient follow-up is required at this time.  Lorita Barnie Pereyra 06/05/2024, 10:57 AM

## 2024-06-05 NOTE — Assessment & Plan Note (Signed)
 stable, wants to be off Prilosec,  Hgb 12.9 06/02/24

## 2024-06-05 NOTE — Progress Notes (Signed)
 Location:   [facility] Room Number: 906 Place of Service:  ALF (13) Provider: Larwance Thatcher Doberstein NP  Sherlynn Madden, MD  Patient Care Team: Sherlynn Madden, MD as PCP - General (Internal Medicine)  Extended Emergency Contact Information Primary Emergency Contact: Hertzwig,Kevin Address: [address]          [address]          [place], [place] [address] [place] Home Phone: [phone] Mobile Phone: [phone] Relation: Son Secondary Emergency Contact: Hertzwig,Eric Address: [address] [place] , [place] [address] [place] Home Phone: [phone] Mobile Phone: [phone] Relation: Son  Code Status: DNR Goals of care: Advanced Directive information    06/02/2024    4:07 PM  Advanced Directives  Does Patient Have a Medical Advance Directive? Yes  Type of Advance Directive Out of facility DNR (pink MOST or yellow form)     Chief Complaint  Patient presents with  . Acute Visit    Deconditioning, f/u ED eval for fall 06/02/24    HPI:  Pt is a 89 y.o. female seen today for an acute visit for generalized weakness, needs more assistance with ADLs, c/o blurred vision, suspected TIA but declined ED eval/workups, no focal weakness present upon my visit today.   Generalized frailty, still ambulates with walker slowly.   ED eval 06/02/24 for fall, mechanical, resulted peri mouth contusion, pending dental eval. Left elbow/wrist skin tears. CT head/neck/maxilofacial, Xray chest, left  shoulder/elbow/wrist, pelvis no acute processes.    UA 06/02/24 ED large leukocytosis, positive nitrite, wbc>50, culture 90,000c/ml, KLEBSIELLA PNEUMONIAE Abnormal  susceptible to cephalosporins.              Edema BLE, takes Furosemide , 06/20/20 EF 60-65%, Bun/creat 16/0.93 06/02/24             RLS takes Gabapentin , off Requip. Vit B12 818 03/25/23(taking Vit B12)             Seizure, saw neurology, presentation of left sided weakness again. EEG showed  focal seizure right frontal region. Takes Keppra  since 07/23/20.             Hypothyroidism, taking Levothyroxine , TSH 1.72 03/30/24             Hyponatremia, stable, Na 134 06/02/24 at baseline, declined Nacl in the past.              Anxiety, not sleeping well, prn Lorazepamavailable to her, failed Mirtazapine, Lexapro, Ambien .              GERD, stable, wants to be off Prilosec,  Hgb 12.9 06/02/24             Constipation, takes MiraLax , Senokot S             OA, left hip, s/p total left hip, multiple sites, takes Tylenol , Aleve , Gabapentin  for pain in toes, failed Methocarbamol  and Meloxicam recommended by Ortho-felt wobbly.              OP off Boniva, Ca, Vit D, declined DEXA, Vit D 16 10/07/21.              TIA, x2, off  Atorvastatin  per patient's request, on ASA              Urinary frequency, prolapsed bladder, chronic,  declined Myrbetriq 2/2 to the cost.      Past Medical History:  Diagnosis Date  . Abdominal pain  02/26/2017  . Anxiety   . Arthritis    Left Knee, Wrist, Back and Hips  . Cervical vertebral fusion   . Diverticulitis   . Diverticulosis   . Fibromyalgia   . GERD (gastroesophageal reflux disease)   . Gout 02/2018   L hand  . Hypertension     after d/c from hospital no current problems or medication  . Hypothyroidism   . Pelvis fracture (HCC)   . Peritoneal free air 03/29/2012  . Pneumonia   . Pneumoperitoneum 02/26/2017  . Pre-diabetes   . Seizure (HCC)   . Thyroid  disorder   . Toe pain 09/06/2016   Past Surgical History:  Procedure Laterality Date  . ABDOMINAL HYSTERECTOMY    . BOWEL RESECTION N/A 07/09/2017   Procedure: SMALL BOWEL RESECTION;  Surgeon: Gladis Cough, MD;  Location: [facility];  Service: General;  Laterality: N/A;  . CERVICAL FUSION     x2  . CONVERSION TO TOTAL HIP Left 10/31/2020   Procedure: CONVERSION TO ANTERIOR TOTAL HIP;  Surgeon: Ernie Cough, MD;  Location: [facility];  Service: Orthopedics;  Laterality: Left;  2 hrs  . FEMUR IM NAIL Left  03/03/2020   Procedure: INTRAMEDULLARY (IM) NAIL FEMORAL;  Surgeon: Ernie Cough, MD;  Location: [facility];  Service: Orthopedics;  Laterality: Left;  . LAPAROSCOPIC APPENDECTOMY N/A 03/04/2017   Procedure: APPENDECTOMY LAPAROSCOPIC;  Surgeon: Cough Gladis, MD;  Location: [facility];  Service: General;  Laterality: N/A;  . LAPAROSCOPY N/A 03/04/2017   Procedure: LAPAROSCOPY DIAGNOSTIC, ENTEROLYSIS;  Surgeon: Cough Gladis, MD;  Location: [facility];  Service: General;  Laterality: N/A;  . LAPAROTOMY N/A 07/09/2017   Procedure: EXPLORATORY LAPAROTOMY;  Surgeon: Gladis Cough, MD;  Location: [facility];  Service: General;  Laterality: N/A;  . SPINE SURGERY     Lumbar- rod placement  . TONSILLECTOMY     89y/o  . TOTAL VAGINAL HYSTERECTOMY      Allergies  Allergen Reactions  . Codeine Rash  . Penicillins Rash    Has patient had a PCN reaction causing immediate rash, facial/tongue/throat swelling, SOB or lightheadedness with hypotension: No Has patient had a PCN reaction causing severe rash involving mucus membranes or skin necrosis: No Has patient had a PCN reaction that required hospitalization: No Has patient had a PCN reaction occurring within the last 10 years: No If all of the above answers are NO, then may proceed with Cephalosporin use.  Tolerated Cephalosporin 10/31/20    . Levaquin [Levofloxacin]     Allergies as of 06/05/2024       Reactions   Codeine Rash   Penicillins Rash   Has patient had a PCN reaction causing immediate rash, facial/tongue/throat swelling, SOB or lightheadedness with hypotension: No Has patient had a PCN reaction causing severe rash involving mucus membranes or skin necrosis: No Has patient had a PCN reaction that required hospitalization: No Has patient had a PCN reaction occurring within the last 10 years: No If all of the above answers are NO, then may proceed with Cephalosporin use. Tolerated Cephalosporin 10/31/20   Levaquin [levofloxacin]          Medication List        Accurate as of June 05, 2024  3:43 PM. If you have any questions, ask your nurse or doctor.          acetaminophen  500 MG tablet Commonly known as: TYLENOL  Take 1,000 mg by mouth 2 (two) times daily.   aspirin  EC 81 MG tablet Take 81 mg  by mouth daily. Swallow whole.   cephALEXin  500 MG capsule Commonly known as: KEFLEX  Take 1 capsule (500 mg total) by mouth 2 (two) times daily for 7 days.   cyanocobalamin 1000 MCG tablet Commonly known as: VITAMIN B12 Take 1,000 mcg by mouth daily.   escitalopram 5 MG tablet Commonly known as: LEXAPRO Take 5 mg by mouth daily.   furosemide  20 MG tablet Commonly known as: LASIX  Take 20 mg by mouth every other day.   FUROSEMIDE  PO Take 30 mg by mouth every other day. See other entry also   gabapentin  100 MG capsule Commonly known as: NEURONTIN  Take 1 capsule (100 mg total) by mouth at bedtime.   ibandronate 150 MG tablet Commonly known as: BONIVA Take 150 mg by mouth every 30 (thirty) days. Take in the morning with a full glass of water , on an empty stomach, and do not take anything else by mouth or lie down for the next 30 min.   ibuprofen  200 MG tablet Commonly known as: ADVIL  Take 200 mg by mouth daily as needed.   levETIRAcetam  500 MG 24 hr tablet Commonly known as: Keppra  XR Take 1 tablet (500 mg total) by mouth daily.   levothyroxine  75 MCG tablet Commonly known as: SYNTHROID  Take 1 tablet (75 mcg total) by mouth daily before breakfast.   LORazepam  0.5 MG tablet Commonly known as: ATIVAN  Take 0.25 mg by mouth every 8 (eight) hours as needed for anxiety.   LORazepam  0.5 MG tablet Commonly known as: Ativan  Take 0.5 tablets (0.25 mg total) by mouth 2 (two) times daily as needed for anxiety.   MELATONIN GUMMIES PO Take 5 mg by mouth at bedtime.   mupirocin  ointment 2 % Commonly known as: BACTROBAN  Apply 1 Application topically as directed. Apply to irritated area topically as needed for  irritation May keep at bedside to self administer twice daily as needed   Olopatadine HCl 0.7 % Soln Apply to eye daily as needed (One drop to each eye).   Oyster Shell 500 MG Tabs Take 1 tablet by mouth daily.   polyethylene glycol 17 g packet Commonly known as: MIRALAX  / GLYCOLAX  Take 17 g by mouth daily as needed.   sennosides-docusate sodium  8.6-50 MG tablet Commonly known as: SENOKOT-S Take 1-2 tablets by mouth at bedtime as needed for constipation.   sodium fluoride  1.1 % Gel dental gel Commonly known as: FLUORISHIELD Place 1 Application onto teeth daily in the afternoon.   vitamin D3 50 MCG (2000 UT) Caps Take 2,000 Units by mouth daily.   zolpidem  5 MG tablet Commonly known as: AMBIEN  Take 5 mg by mouth at bedtime as needed for sleep.        Review of Systems  Constitutional:  Negative for appetite change, fatigue and fever.  HENT:  Positive for hearing loss. Negative for congestion and trouble swallowing.   Eyes:  Negative for redness and visual disturbance.  Respiratory:  Negative for cough, shortness of breath and wheezing.        DOE  Cardiovascular:  Positive for leg swelling.  Gastrointestinal:  Negative for abdominal pain and constipation.  Genitourinary:  Positive for frequency. Negative for dysuria and urgency.       Bathroom trips 3-4x/night  Musculoskeletal:  Positive for arthralgias, back pain and gait problem.       Left hip pain, s/p total left hip arthroplasty. Multiple sites aches in am, better as day goes by. Pain in toes.  Chronic back pain  Skin:  Positive for wound.       bruises  Neurological:  Negative for seizures, weakness and headaches.       Restless legs at night.  Memory lapses.   Psychiatric/Behavioral:  Positive for sleep disturbance. Negative for behavioral problems. The patient is not nervous/anxious.     Immunization History  Administered Date(s) Administered  . Fluzone Influenza virus vaccine,trivalent (IIV3), split  virus 08/11/2018, 08/03/2019  . Influenza, High Dose Seasonal PF 09/01/2023  . Influenza-Unspecified 09/01/2018, 09/10/2020, 09/18/2021, 09/21/2022  . Moderna Covid-19 Fall Seasonal Vaccine 20yrs & older 09/15/2023  . Moderna Sars-Covid-2 Vaccination 12/04/2019, 01/01/2020  . Pfizer Covid-19 Vaccine Bivalent Booster 53yrs & up 10/01/2022  . Pneumococcal Conjugate-13 05/29/2014  . Pneumococcal Polysaccharide-23 06/14/2015  . Tdap 09/14/2012  . Zoster Recombinant(Shingrix) 02/10/2022, 05/11/2022  . Zoster, Live 04/30/2004, 04/13/2018, 07/05/2018   Pertinent  Health Maintenance Due  Topic Date Due  . DEXA SCAN  Never done  . INFLUENZA VACCINE  06/30/2024      11/04/2020   12:00 PM 04/23/2021   11:34 AM 12/08/2022    9:52 AM 12/11/2022    2:50 PM 05/19/2023    9:23 AM  Fall Risk  Falls in the past year?   0 0 1  Was there an injury with Fall?   0 0 1  Fall Risk Category Calculator   0 0 2  Fall Risk Category (Retired)   Low  Low    (RETIRED) Patient Fall Risk Level High fall risk  Moderate fall risk  Low fall risk  Low fall risk    Patient at Risk for Falls Due to   No Fall Risks No Fall Risks History of fall(s);Impaired balance/gait;Impaired mobility  Fall risk Follow up   Falls evaluation completed  Falls evaluation completed  Falls evaluation completed     Data saved with a previous flowsheet row definition   Functional Status Survey:    Vitals:   06/05/24 1347  BP: (!) 147/86  Pulse: 100  Resp: 19  Temp: (!) 97.5 F (36.4 C)  SpO2: 96%   There is no height or weight on file to calculate BMI. Physical Exam Constitutional:      Appearance: Normal appearance.  HENT:     Head: Normocephalic and atraumatic.     Mouth/Throat:     Mouth: Mucous membranes are moist.  Eyes:     Extraocular Movements: Extraocular movements intact.     Pupils: Pupils are equal, round, and reactive to light.  Cardiovascular:     Rate and Rhythm: Normal rate and regular rhythm.     Heart  sounds: No murmur heard.    Comments: Weak left DP pulse, more symptomatic tingling sensation at night.  Pulmonary:     Effort: Pulmonary effort is normal.     Breath sounds: No rales.     Comments: Central congestion Abdominal:     General: Bowel sounds are normal.     Palpations: Abdomen is soft.     Tenderness: There is no abdominal tenderness.  Musculoskeletal:     Cervical back: Normal range of motion and neck supple.     Right lower leg: Edema present.     Left lower leg: Edema present.     Comments: mild scoliosis. Hx of back surgeries x3 with left sciatica pain. s/p total left hip replacement. R SIJ pain, positional, palpated, still able to bear weight, walking BLE edema 1+  peri mouth contusion, pending dental eval. Left elbow/wrist skin tears, steri strips closure  intact, no s/s of infection   Skin:    General: Skin is warm and dry.     Findings: Bruising present.  Neurological:     General: No focal deficit present.     Mental Status: She is alert and oriented to person, place, and time. Mental status is at baseline.     Gait: Gait abnormal.     Comments: Using walker   Psychiatric:        Mood and Affect: Mood normal.        Behavior: Behavior normal.        Thought Content: Thought content normal.        Judgment: Judgment normal.     Labs reviewed: Recent Labs    09/02/23 0000 06/02/24 1729  NA 133* 134*  K 4.5 3.8  CL 99 97*  CO2 26* 24  GLUCOSE  --  118*  BUN 15 16  CREATININE 0.6 0.93  CALCIUM  9.1 10.0   Recent Labs    09/02/23 0000  ALBUMIN  3.5   Recent Labs    09/02/23 0000 06/02/24 1729  WBC 4.7 8.9  NEUTROABS 2,571.00  --   HGB 11.6* 12.9  HCT 35* 38.5  MCV  --  97.0  PLT 383 385   Lab Results  Component Value Date   TSH 1.00 09/02/2023   Lab Results  Component Value Date   HGBA1C 5.8 (H) 10/21/2020   Lab Results  Component Value Date   CHOL 171 06/20/2020   HDL 59 06/20/2020   LDLCALC 94 06/20/2020   TRIG 89 06/20/2020    CHOLHDL 2.9 06/20/2020    Significant Diagnostic Results in last 30 days:  DG Pelvis Portable Result Date: 06/02/2024 CLINICAL DATA:  Unwitnessed fall EXAM: PORTABLE PELVIS 1-2 VIEWS COMPARISON:  10/31/2020 FINDINGS: Extensive hardware in the lumbar and upper sacral spine. Left hip replacement with normal alignment. Pubic symphysis and rami appear intact. SI joints are non widened. No acute fracture. Moderate advanced right femoroacetabular degenerative changes IMPRESSION: No acute osseous abnormality. Left hip replacement. Electronically Signed   By: Luke Bun M.D.   On: 06/02/2024 17:24   DG Chest Portable 1 View Result Date: 06/02/2024 CLINICAL DATA:  Fall EXAM: PORTABLE CHEST 1 VIEW COMPARISON:  07/18/2020 FINDINGS: Hardware in the cervical spine. Calcified hilar nodes and calcified lung granulomas. No acute pleural effusion or pneumothorax. Small vague right lower lung opacity. Stable cardiomediastinal silhouette. IMPRESSION: Prior granulomatous disease. Small vague focus of opacity in the right lower lung indeterminate for infection or inflammation, suggest short interval two-view chest radiograph follow-up Electronically Signed   By: Luke Bun M.D.   On: 06/02/2024 17:23   DG Elbow 2 Views Left Result Date: 06/02/2024 CLINICAL DATA:  Fall with laceration EXAM: LEFT ELBOW - 2 VIEW COMPARISON:  None Available. FINDINGS: No definitive fracture or malalignment. No significant elbow effusion IMPRESSION: No acute osseous abnormality. Electronically Signed   By: Luke Bun M.D.   On: 06/02/2024 17:21   DG Wrist Complete Left Result Date: 06/02/2024 CLINICAL DATA:  Fall EXAM: LEFT WRIST - COMPLETE 3+ VIEW COMPARISON:  None Available. FINDINGS: Limited by habitus. Osteopenia. No gross fracture or dislocation. Severe arthritis at the first [facility] joint and STT interval. Advanced radiocarpal degenerative changes. Chondro calcinosis. IMPRESSION: 1. Limited by habitus. No gross acute osseous  abnormality. 2. 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 Electronically Signed   By: Luke Bun M.D.   On: 06/02/2024 17:20   DG Shoulder Left Portable Result Date:  06/02/2024 CLINICAL DATA:  Mechanical fall EXAM: LEFT SHOULDER COMPARISON:  None Available. FINDINGS: No fracture or malalignment. Mild AC joint degenerative change. Mild calcific tendinopathy IMPRESSION: No acute osseous abnormality. Electronically Signed   By: Luke Bun M.D.   On: 06/02/2024 17:18   CT Head Wo Contrast Result Date: 06/02/2024 CLINICAL DATA:  Unwitnessed fall, fall on face, laceration EXAM: CT HEAD WITHOUT CONTRAST CT MAXILLOFACIAL WITHOUT CONTRAST CT CERVICAL SPINE WITHOUT CONTRAST TECHNIQUE: Multidetector CT imaging of the head, cervical spine, and maxillofacial structures were performed using the standard protocol without intravenous contrast. Multiplanar CT image reconstructions of the cervical spine and maxillofacial structures were also generated. RADIATION DOSE REDUCTION: This exam was performed according to the departmental dose-optimization program which includes automated exposure control, adjustment of the mA and/or kV according to patient size and/or use of iterative reconstruction technique. COMPARISON:  07/11/2020 FINDINGS: CT HEAD FINDINGS Brain: No evidence of acute infarction, hemorrhage, hydrocephalus, extra-axial collection or mass lesion/mass effect. Periventricular white matter hypodensity. Vascular: No hyperdense vessel or unexpected calcification. CT FACIAL BONES FINDINGS Skull: Normal. Negative for fracture or focal lesion. Facial bones: No displaced fractures or dislocations. Sinuses/Orbits: No acute finding. Other: None. CT CERVICAL SPINE FINDINGS Alignment: Degenerative and postoperative straightening of the normal cervical lordosis. Degenerative anterolisthesis of C7 on T1 Skull base and vertebrae: No acute fracture. No primary bone lesion or focal pathologic process. Soft tissues and spinal canal: No  prevertebral fluid or swelling. No visible canal hematoma. Disc levels: Anterior cervical discectomy and fusion of C4-C5 with ankylosis of C5-C6. Severe disc degenerative change of the remaining cervical levels. Upper chest: Negative. Other: None. IMPRESSION: 1. No acute intracranial pathology. 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 2. No displaced fractures or dislocations of the facial bones. 3. No fracture or static subluxation of the cervical spine. 4. Anterior cervical discectomy and fusion of C4-C5 with ankylosis of C5-C6. Severe disc degenerative change of the remaining cervical levels. Electronically Signed   By: Marolyn JONETTA Jaksch M.D.   On: 06/02/2024 17:03   CT Cervical Spine Wo Contrast Result Date: 06/02/2024 CLINICAL DATA:  Unwitnessed fall, fall on face, laceration EXAM: CT HEAD WITHOUT CONTRAST CT MAXILLOFACIAL WITHOUT CONTRAST CT CERVICAL SPINE WITHOUT CONTRAST TECHNIQUE: Multidetector CT imaging of the head, cervical spine, and maxillofacial structures were performed using the standard protocol without intravenous contrast. Multiplanar CT image reconstructions of the cervical spine and maxillofacial structures were also generated. RADIATION DOSE REDUCTION: This exam was performed according to the departmental dose-optimization program which includes automated exposure control, adjustment of the mA and/or kV according to patient size and/or use of iterative reconstruction technique. COMPARISON:  07/11/2020 FINDINGS: CT HEAD FINDINGS Brain: No evidence of acute infarction, hemorrhage, hydrocephalus, extra-axial collection or mass lesion/mass effect. Periventricular white matter hypodensity. Vascular: No hyperdense vessel or unexpected calcification. CT FACIAL BONES FINDINGS Skull: Normal. Negative for fracture or focal lesion. Facial bones: No displaced fractures or dislocations. Sinuses/Orbits: No acute finding. Other: None. CT CERVICAL SPINE FINDINGS Alignment:  Degenerative and postoperative straightening of the normal cervical lordosis. Degenerative anterolisthesis of C7 on T1 Skull base and vertebrae: No acute fracture. No primary bone lesion or focal pathologic process. Soft tissues and spinal canal: No prevertebral fluid or swelling. No visible canal hematoma. Disc levels: Anterior cervical discectomy and fusion of C4-C5 with ankylosis of C5-C6. Severe disc degenerative change of the remaining cervical levels. Upper chest: Negative. Other: None. IMPRESSION: 1. No acute intracranial pathology. Small-vessel white matter disease in keeping with  advanced patient age. 2. No displaced fractures or dislocations of the facial bones. 3. No fracture or static subluxation of the cervical spine. 4. Anterior cervical discectomy and fusion of C4-C5 with ankylosis of C5-C6. Severe disc degenerative change of the remaining cervical levels. Electronically Signed   By: Marolyn JONETTA Jaksch M.D.   On: 06/02/2024 17:03   CT Maxillofacial Wo Contrast Result Date: 06/02/2024 CLINICAL DATA:  Unwitnessed fall, fall on face, laceration EXAM: CT HEAD WITHOUT CONTRAST CT MAXILLOFACIAL WITHOUT CONTRAST CT CERVICAL SPINE WITHOUT CONTRAST TECHNIQUE: Multidetector CT imaging of the head, cervical spine, and maxillofacial structures were performed using the standard protocol without intravenous contrast. Multiplanar CT image reconstructions of the cervical spine and maxillofacial structures were also generated. RADIATION DOSE REDUCTION: This exam was performed according to the departmental dose-optimization program which includes automated exposure control, adjustment of the mA and/or kV according to patient size and/or use of iterative reconstruction technique. COMPARISON:  07/11/2020 FINDINGS: CT HEAD FINDINGS Brain: No evidence of acute infarction, hemorrhage, hydrocephalus, extra-axial collection or mass lesion/mass effect. Periventricular white matter hypodensity. Vascular: No hyperdense vessel or  unexpected calcification. CT FACIAL BONES FINDINGS Skull: Normal. Negative for fracture or focal lesion. Facial bones: No displaced fractures or dislocations. Sinuses/Orbits: No acute finding. Other: None. CT CERVICAL SPINE FINDINGS Alignment: Degenerative and postoperative straightening of the normal cervical lordosis. Degenerative anterolisthesis of C7 on T1 Skull base and vertebrae: No acute fracture. No primary bone lesion or focal pathologic process. Soft tissues and spinal canal: No prevertebral fluid or swelling. No visible canal hematoma. Disc levels: Anterior cervical discectomy and fusion of C4-C5 with ankylosis of C5-C6. Severe disc degenerative change of the remaining cervical levels. Upper chest: Negative. Other: None. IMPRESSION: 1. No acute intracranial pathology. 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 2. No displaced fractures or dislocations of the facial bones. 3. No fracture or static subluxation of the cervical spine. 4. Anterior cervical discectomy and fusion of C4-C5 with ankylosis of C5-C6. Severe disc degenerative change of the remaining cervical levels. Electronically Signed   By: Marolyn JONETTA Jaksch M.D.   On: 06/02/2024 17:03    Assessment/Plan: Physical deconditioning Functional testing May need a skilled nursing facility for care needs after UTI is not fully treated Therapy to eval and treat  Urinary tract infection UA 06/02/24 ED large leukocytosis, positive nitrite, wbc>50, culture 90,000c/ml, KLEBSIELLA PNEUMONIAE Abnormal  susceptible to cephalosporins.   Fall with injury ED eval 06/02/24 for fall, mechanical, resulted peri mouth contusion, pending dental eval. Left elbow/wrist skin tears. CT head/neck/maxilofacial, Xray chest, left  shoulder/elbow/wrist, pelvis no acute processes.  Continue Steri-Strips for the wound, monitor for signs symptoms of infection  Venous insufficiency takes Furosemide , 06/20/20 EF 60-65%, Bun/creat 16/0.93  06/02/24  Restless leg syndrome  takes Gabapentin , off Requip. Vit B12 818 03/25/23(taking Vit B12)  Seizures (HCC) saw neurology, presentation of left sided weakness again. EEG showed focal seizure right frontal region. Takes Keppra  since 07/23/20.  Hypothyroidism  taking Levothyroxine , TSH 1.72 03/30/24  Hyponatremia  stable, Na 134 06/02/24 at baseline, declined Nacl in the past.   Insomnia secondary to anxiety not sleeping well, prn Lorazepamavailable to her, failed Mirtazapine, Lexapro, Ambien .   GERD (gastroesophageal reflux disease) stable, wants to be off Prilosec,  Hgb 12.9 06/02/24  Slow transit constipation Stable,  takes MiraLax , Senokot S  Osteoarthritis, multiple sites  left hip, s/p total left hip, multiple sites, takes Tylenol , Aleve , Gabapentin  for pain in toes, failed Methocarbamol  and Meloxicam recommended by Ortho-felt  wobbly.   Osteoporosis  off Boniva, Ca, Vit D, declined DEXA, Vit D 16 10/07/21.  History of TIA (transient ischemic attack) x2, off  Atorvastatin  per patient's request, on ASA   Urinary frequency Urinary frequency, prolapsed bladder, chronic,  declined Myrbetriq 2/2 to the cost.     Family/ staff Communication: plan of care reviewed with the patient, the patient's sons, and charge nurse.   Labs/tests ordered:  none

## 2024-06-05 NOTE — Assessment & Plan Note (Signed)
 takes Furosemide , 06/20/20 EF 60-65%, Bun/creat 16/0.93 06/02/24

## 2024-06-05 NOTE — Assessment & Plan Note (Signed)
 Functional testing May need a skilled nursing facility for care needs after UTI is not fully treated Therapy to eval and treat

## 2024-06-05 NOTE — Assessment & Plan Note (Signed)
 not sleeping well, prn Lorazepamavailable to her, failed Mirtazapine, Lexapro, Ambien .

## 2024-06-05 NOTE — Assessment & Plan Note (Signed)
saw neurology, presentation of left sided weakness again. EEG showed focal seizure right frontal region. Takes Keppra since 07/23/20

## 2024-06-20 ENCOUNTER — Non-Acute Institutional Stay: Admitting: Nurse Practitioner

## 2024-06-20 ENCOUNTER — Encounter: Payer: Self-pay | Admitting: Nurse Practitioner

## 2024-06-20 DIAGNOSIS — G2581 Restless legs syndrome: Secondary | ICD-10-CM | POA: Diagnosis not present

## 2024-06-20 DIAGNOSIS — I10 Essential (primary) hypertension: Secondary | ICD-10-CM

## 2024-06-20 DIAGNOSIS — F5105 Insomnia due to other mental disorder: Secondary | ICD-10-CM

## 2024-06-20 DIAGNOSIS — F419 Anxiety disorder, unspecified: Secondary | ICD-10-CM

## 2024-06-20 DIAGNOSIS — R569 Unspecified convulsions: Secondary | ICD-10-CM

## 2024-06-20 DIAGNOSIS — K5901 Slow transit constipation: Secondary | ICD-10-CM

## 2024-06-20 DIAGNOSIS — R600 Localized edema: Secondary | ICD-10-CM

## 2024-06-20 DIAGNOSIS — E871 Hypo-osmolality and hyponatremia: Secondary | ICD-10-CM

## 2024-06-20 DIAGNOSIS — R051 Acute cough: Secondary | ICD-10-CM

## 2024-06-20 DIAGNOSIS — E039 Hypothyroidism, unspecified: Secondary | ICD-10-CM

## 2024-06-20 DIAGNOSIS — R059 Cough, unspecified: Secondary | ICD-10-CM | POA: Insufficient documentation

## 2024-06-20 LAB — COMPREHENSIVE METABOLIC PANEL WITH GFR
Albumin: 4.4 (ref 3.5–5.0)
Calcium: 9.8 (ref 8.7–10.7)
Calcium: 9.9 (ref 8.7–10.7)
Globulin: 2.7

## 2024-06-20 LAB — BASIC METABOLIC PANEL WITH GFR
BUN: 13 (ref 4–21)
BUN: 14 (ref 4–21)
CO2: 25 — AB (ref 13–22)
CO2: 28 — AB (ref 13–22)
Chloride: 95 — AB (ref 99–108)
Chloride: 96 — AB (ref 99–108)
Creatinine: 0.5 (ref 0.5–1.1)
Creatinine: 0.6 (ref 0.5–1.1)
Glucose: 119
Glucose: 146
Potassium: 4.1 meq/L (ref 3.5–5.1)
Potassium: 4.3 meq/L (ref 3.5–5.1)
Sodium: 128 — AB (ref 137–147)
Sodium: 132 — AB (ref 137–147)

## 2024-06-20 LAB — HEPATIC FUNCTION PANEL
ALT: 9 U/L (ref 7–35)
AST: 17 (ref 13–35)
Alkaline Phosphatase: 67 (ref 25–125)
Bilirubin, Total: 0.6

## 2024-06-20 LAB — CBC AND DIFFERENTIAL
HCT: 37 (ref 36–46)
Hemoglobin: 12.6 (ref 12.0–16.0)
Neutrophils Absolute: 10800
Platelets: 457 K/uL — AB (ref 150–400)
WBC: 13.3

## 2024-06-20 LAB — CBC: RBC: 3.83 — AB (ref 3.87–5.11)

## 2024-06-20 NOTE — Assessment & Plan Note (Signed)
 Mild, chronic edema BLE, takes Furosemide , 06/20/20 EF 60-65%, Bun/creat 16/0.93 06/02/24

## 2024-06-20 NOTE — Assessment & Plan Note (Signed)
 Update CBC/differential, CMP/GFR, chest x-ray AP/lateral VS and SAT O2  every shift for 72 hours O2 via nasal cannula to maintain Sat O2 greater than 90% Tessalon 100 mg 3 times daily p.o. for 72 hours DuoNeb every 8 hours for 72 hours

## 2024-06-20 NOTE — Assessment & Plan Note (Signed)
saw neurology, presentation of left sided weakness again. EEG showed focal seizure right frontal region. Takes Keppra since 07/23/20

## 2024-06-20 NOTE — Progress Notes (Unsigned)
 Location:   [facility] Room Number: 906-A Place of Service:  ALF [phone]) Provider:  Rosalin Buster, NP  PCP: Sherlynn Madden, MD  Patient Care Team: Sherlynn Madden, MD as PCP - General (Internal Medicine)  Extended Emergency Contact Information Primary Emergency Contact: Hertzwig,Kevin Address: [address]          [address]          [place], [place] [address] [place] Home Phone: [phone] Mobile Phone: [phone] Relation: Son Secondary Emergency Contact: Hertzwig,Eric Address: [address] [place] , [place] [address] [place] Home Phone: [phone] Mobile Phone: [phone] Relation: Son  Code Status:  DNR Goals of care: Advanced Directive information    06/21/2024   12:23 PM  Advanced Directives  Does Patient Have a Medical Advance Directive? Yes  Type of Advance Directive Living will;Out of facility DNR (pink MOST or yellow form)  Does patient want to make changes to medical advance directive? No - Patient declined     Chief Complaint  Patient presents with  . Fatigue    HPI:  Pt is a 89 y.o. female seen today for an acute visit for failing exhausted, congestive cough, O2 desaturation, denied chest pain, palpitation, or phlegm production.  She is able febrile, no focal neurological symptoms.  Generalized frailty, still ambulates with walker slowly.              ED eval 06/02/24 for fall, mechanical, resulted peri mouth contusion, pending dental eval. Left elbow/wrist skin tears. CT head/neck/maxilofacial, Xray chest, left  shoulder/elbow/wrist, pelvis no acute processes.                          UA 06/02/24 ED large leukocytosis, positive nitrite, wbc>50, culture 90,000c/ml, KLEBSIELLA PNEUMONIAE Abnormal  susceptible to cephalosporins.              Edema BLE, not taking  Furosemide , 06/20/20 EF 60-65%, Bun/creat 16/0.93 06/02/24             RLS takes Gabapentin , off Requip. Vit B12 818  03/25/23(taking Vit B12)             Seizure, saw neurology, presentation of left sided weakness again. EEG showed focal seizure right frontal region. Takes Keppra  since 07/23/20.             Hypothyroidism, taking Levothyroxine , TSH 1.72 03/30/24             Hyponatremia, stable, Na 134 06/02/24 at baseline, declined Nacl in the past.              Anxiety, not sleeping well, prn Lorazepamavailable to her, failed Mirtazapine, Lexapro, Ambien .              GERD, stable, wants to be off Prilosec,  Hgb 12.9 06/02/24             Constipation, takes MiraLax , Senokot S             OA, left hip, s/p total left hip, multiple sites, takes Tylenol , Aleve , Gabapentin  for pain in toes, failed Methocarbamol  and Meloxicam recommended by Ortho-felt wobbly.              OP off Boniva, Ca, Vit D, declined DEXA, Vit D 16 10/07/21.              TIA, x2, off  Atorvastatin  per patient's request, on ASA  Urinary frequency, prolapsed bladder, chronic,  declined Myrbetriq 2/2 to the cost.        Past Medical History:  Diagnosis Date  . Abdominal pain 02/26/2017  . Anxiety   . Arthritis    Left Knee, Wrist, Back and Hips  . Cervical vertebral fusion   . Diverticulitis   . Diverticulosis   . Fibromyalgia   . GERD (gastroesophageal reflux disease)   . Gout 02/2018   L hand  . Hypertension     after d/c from hospital no current problems or medication  . Hypothyroidism   . Pelvis fracture (HCC)   . Peritoneal free air 03/29/2012  . Pneumonia   . Pneumoperitoneum 02/26/2017  . Pre-diabetes   . Seizure (HCC)   . Thyroid  disorder   . Toe pain 09/06/2016   Past Surgical History:  Procedure Laterality Date  . ABDOMINAL HYSTERECTOMY    . BOWEL RESECTION N/A 07/09/2017   Procedure: SMALL BOWEL RESECTION;  Surgeon: Gladis Cough, MD;  Location: [facility];  Service: General;  Laterality: N/A;  . CERVICAL FUSION     x2  . CONVERSION TO TOTAL HIP Left 10/31/2020   Procedure: CONVERSION TO ANTERIOR TOTAL HIP;   Surgeon: Ernie Cough, MD;  Location: [facility];  Service: Orthopedics;  Laterality: Left;  2 hrs  . FEMUR IM NAIL Left 03/03/2020   Procedure: INTRAMEDULLARY (IM) NAIL FEMORAL;  Surgeon: Ernie Cough, MD;  Location: [facility];  Service: Orthopedics;  Laterality: Left;  . LAPAROSCOPIC APPENDECTOMY N/A 03/04/2017   Procedure: APPENDECTOMY LAPAROSCOPIC;  Surgeon: Cough Gladis, MD;  Location: [facility];  Service: General;  Laterality: N/A;  . LAPAROSCOPY N/A 03/04/2017   Procedure: LAPAROSCOPY DIAGNOSTIC, ENTEROLYSIS;  Surgeon: Cough Gladis, MD;  Location: [facility];  Service: General;  Laterality: N/A;  . LAPAROTOMY N/A 07/09/2017   Procedure: EXPLORATORY LAPAROTOMY;  Surgeon: Gladis Cough, MD;  Location: [facility];  Service: General;  Laterality: N/A;  . SPINE SURGERY     Lumbar- rod placement  . TONSILLECTOMY     89y/o  . TOTAL VAGINAL HYSTERECTOMY      Allergies  Allergen Reactions  . Codeine Rash  . Penicillins Rash    Has patient had a PCN reaction causing immediate rash, facial/tongue/throat swelling, SOB or lightheadedness with hypotension: No Has patient had a PCN reaction causing severe rash involving mucus membranes or skin necrosis: No Has patient had a PCN reaction that required hospitalization: No Has patient had a PCN reaction occurring within the last 10 years: No If all of the above answers are NO, then may proceed with Cephalosporin use.  Tolerated Cephalosporin 10/31/20    . Levaquin [Levofloxacin]     Allergies as of 06/20/2024       Reactions   Codeine Rash   Penicillins Rash   Has patient had a PCN reaction causing immediate rash, facial/tongue/throat swelling, SOB or lightheadedness with hypotension: No Has patient had a PCN reaction causing severe rash involving mucus membranes or skin necrosis: No Has patient had a PCN reaction that required hospitalization: No Has patient had a PCN reaction occurring within the last 10 years: No If all of the above answers are NO,  then may proceed with Cephalosporin use. Tolerated Cephalosporin 10/31/20   Levaquin [levofloxacin]         Medication List        Accurate as of June 20, 2024 11:59 PM. If you have any questions, ask your nurse or doctor.  STOP taking these medications    escitalopram 5 MG tablet Commonly known as: LEXAPRO Stopped by: Shalita Notte X Cameo Shewell   ibandronate 150 MG tablet Commonly known as: BONIVA Stopped by: Romona Murdy X Leif Loflin   vitamin D3 50 MCG (2000 UT) Caps Stopped by: Elwyn Klosinski X Delainy Mcelhiney   zolpidem  5 MG tablet Commonly known as: AMBIEN  Stopped by: Wm Fruchter X Damaria Vachon       TAKE these medications    acetaminophen  500 MG tablet Commonly known as: TYLENOL  Take 1,000 mg by mouth 2 (two) times daily as needed.   aspirin  EC 81 MG tablet Take 81 mg by mouth daily. Swallow whole.   cyanocobalamin 1000 MCG tablet Commonly known as: VITAMIN B12 Take 1,000 mcg by mouth daily.   furosemide  20 MG tablet Commonly known as: LASIX  Take 20 mg by mouth every other day. What changed: Another medication with the same name was removed. Continue taking this medication, and follow the directions you see here. Changed by: Dimitris Shanahan X Georgianna Band   gabapentin  100 MG capsule Commonly known as: NEURONTIN  Take 1 capsule (100 mg total) by mouth at bedtime.   ibuprofen  200 MG tablet Commonly known as: ADVIL  Take 400 mg by mouth daily as needed.   levETIRAcetam  500 MG 24 hr tablet Commonly known as: Keppra  XR Take 1 tablet (500 mg total) by mouth daily.   levothyroxine  75 MCG tablet Commonly known as: SYNTHROID  Take 1 tablet (75 mcg total) by mouth daily before breakfast.   LORazepam  0.5 MG tablet Commonly known as: ATIVAN  Take 0.5 mg by mouth every 8 (eight) hours as needed for anxiety. What changed: Another medication with the same name was removed. Continue taking this medication, and follow the directions you see here. Changed by: Reid Nawrot X Coutney Wildermuth   MELATONIN GUMMIES PO Take 5 mg by mouth at bedtime.    meloxicam 7.5 MG tablet Commonly known as: MOBIC Take 7.5 mg by mouth every 12 (twelve) hours as needed for pain.   mupirocin  ointment 2 % Commonly known as: BACTROBAN  Apply 1 Application topically 2 (two) times daily as needed. Apply to irritated area topically as needed for irritation.   Olopatadine HCl 0.7 % Soln Place 1 drop into both eyes as needed (One drop to each eye).   omeprazole 20 MG capsule Commonly known as: PRILOSEC Take 20 mg by mouth as needed.   Oyster Shell 500 MG Tabs Take 1 tablet by mouth daily.   polyethylene glycol 17 g packet Commonly known as: MIRALAX  / GLYCOLAX  Take 17 g by mouth daily as needed.   sennosides-docusate sodium  8.6-50 MG tablet Commonly known as: SENOKOT-S Take 1-2 tablets by mouth daily.   sennosides-docusate sodium  8.6-50 MG tablet Commonly known as: SENOKOT-S Take 1 tablet by mouth daily as needed for constipation.   sodium fluoride  1.1 % Gel dental gel Commonly known as: FLUORISHIELD Place 1 Application onto teeth daily in the afternoon.        Review of Systems  Constitutional:  Positive for appetite change and fatigue. Negative for fever.  HENT:  Positive for hearing loss. Negative for congestion and trouble swallowing.   Eyes:  Negative for visual disturbance.  Respiratory:  Positive for cough and shortness of breath. Negative for choking, chest tightness and wheezing.        DOE  Cardiovascular:  Positive for leg swelling. Negative for chest pain and palpitations.  Gastrointestinal:  Negative for abdominal pain and constipation.  Genitourinary:  Positive for frequency. Negative for dysuria and urgency.  Bathroom trips 3-4x/night  Musculoskeletal:  Positive for arthralgias, back pain and gait problem.       Left hip pain, s/p total left hip arthroplasty. Multiple sites aches in am, better as day goes by. Pain in toes.  Chronic back pain  Skin:  Negative for color change.  Neurological:  Negative for seizures,  weakness and headaches.       Restless legs at night.  Memory lapses.   Psychiatric/Behavioral:  Positive for sleep disturbance. Negative for behavioral problems. The patient is not nervous/anxious.     Immunization History  Administered Date(s) Administered  . Fluzone Influenza virus vaccine,trivalent (IIV3), split virus 08/11/2018, 08/03/2019  . Influenza, High Dose Seasonal PF 09/01/2023  . Influenza-Unspecified 09/01/2018, 09/10/2020, 09/18/2021, 09/21/2022  . Moderna Covid-19 Fall Seasonal Vaccine 54yrs & older 09/15/2023  . Moderna Sars-Covid-2 Vaccination 12/04/2019, 01/01/2020  . Pfizer Covid-19 Vaccine Bivalent Booster 61yrs & up 10/01/2022  . Pneumococcal Conjugate-13 05/29/2014  . Pneumococcal Polysaccharide-23 06/14/2015  . Tdap 09/14/2012  . Zoster Recombinant(Shingrix) 02/10/2022, 05/11/2022  . Zoster, Live 04/30/2004, 04/13/2018, 07/05/2018   Pertinent  Health Maintenance Due  Topic Date Due  . DEXA SCAN  Never done  . INFLUENZA VACCINE  06/30/2024      11/04/2020   12:00 PM 04/23/2021   11:34 AM 12/08/2022    9:52 AM 12/11/2022    2:50 PM 05/19/2023    9:23 AM  Fall Risk  Falls in the past year?   0 0 1  Was there an injury with Fall?   0 0 1  Fall Risk Category Calculator   0 0 2  Fall Risk Category (Retired)   Low  Low    (RETIRED) Patient Fall Risk Level High fall risk  Moderate fall risk  Low fall risk  Low fall risk    Patient at Risk for Falls Due to   No Fall Risks No Fall Risks History of fall(s);Impaired balance/gait;Impaired mobility  Fall risk Follow up   Falls evaluation completed  Falls evaluation completed  Falls evaluation completed     Data saved with a previous flowsheet row definition   Functional Status Survey:    Vitals:   06/20/24 1432 06/23/24 1118  BP: (!) 160/80 (!) 160/84  Pulse: 88   Resp: 19   Temp: 97.7 F (36.5 C)   SpO2: 94%   Weight: 99 lb 9.6 oz (45.2 kg)   Height: 4' 9 (1.448 m)    Body mass index is 21.55  kg/m. Physical Exam Constitutional:      Appearance: She is ill-appearing.  HENT:     Head: Normocephalic and atraumatic.     Mouth/Throat:     Mouth: Mucous membranes are moist.  Eyes:     Extraocular Movements: Extraocular movements intact.     Pupils: Pupils are equal, round, and reactive to light.  Cardiovascular:     Rate and Rhythm: Normal rate and regular rhythm.     Heart sounds: No murmur heard.    Comments: Weak left DP pulse, more symptomatic tingling sensation at night.  Pulmonary:     Effort: Pulmonary effort is normal.     Breath sounds: No rales.     Comments: Central congestion Abdominal:     General: Bowel sounds are normal.     Palpations: Abdomen is soft.     Tenderness: There is no abdominal tenderness.  Musculoskeletal:     Cervical back: Normal range of motion and neck supple.     Right lower  leg: Edema present.     Left lower leg: Edema present.     Comments: mild scoliosis. Hx of back surgeries x3 with left sciatica pain. s/p total left hip replacement. R SIJ pain, positional, palpated, still able to bear weight, walking BLE edema trace    Skin:    General: Skin is warm and dry.  Neurological:     General: No focal deficit present.     Mental Status: She is alert and oriented to person, place, and time. Mental status is at baseline.     Gait: Gait abnormal.     Comments: Using walker   Psychiatric:        Mood and Affect: Mood normal.        Behavior: Behavior normal.        Thought Content: Thought content normal.        Judgment: Judgment normal.     Labs reviewed: Recent Labs    05/16/24 0000 06/02/24 1729 06/20/24 0000  NA 132* 134* 128*  132*  K 4.3 3.8 4.1  4.3  CL 100 97* 95*  96*  CO2 25* 24 25*  28*  GLUCOSE  --  118*  --   BUN 13 16 14  13   CREATININE 0.6 0.93 0.5  0.6  CALCIUM  9.0 10.0 9.9  9.8   Recent Labs    09/02/23 0000 05/11/24 0000 06/20/24 0000  AST  --  17 17  ALT  --  10 9  ALKPHOS  --  43 67   ALBUMIN  3.5 3.7 4.4   Recent Labs    09/02/23 0000 05/11/24 0000 06/02/24 1729 06/20/24 0000  WBC 4.7 6.1 8.9 13.3  NEUTROABS 2,571.00 3,569.00  --  10,800.00  HGB 11.6* 11.8* 12.9 12.6  HCT 35* 36 38.5 37  MCV  --   --  97.0  --   PLT 383 441* 385 457*   Lab Results  Component Value Date   TSH 1.00 09/02/2023   Lab Results  Component Value Date   HGBA1C 5.8 (H) 10/21/2020   Lab Results  Component Value Date   CHOL 171 06/20/2020   HDL 59 06/20/2020   LDLCALC 94 06/20/2020   TRIG 89 06/20/2020   CHOLHDL 2.9 06/20/2020    Significant Diagnostic Results in last 30 days:  DG Pelvis Portable Result Date: 06/02/2024 CLINICAL DATA:  Unwitnessed fall EXAM: PORTABLE PELVIS 1-2 VIEWS COMPARISON:  10/31/2020 FINDINGS: Extensive hardware in the lumbar and upper sacral spine. Left hip replacement with normal alignment. Pubic symphysis and rami appear intact. SI joints are non widened. No acute fracture. Moderate advanced right femoroacetabular degenerative changes IMPRESSION: No acute osseous abnormality. Left hip replacement. Electronically Signed   By: Luke Bun M.D.   On: 06/02/2024 17:24   DG Chest Portable 1 View Result Date: 06/02/2024 CLINICAL DATA:  Fall EXAM: PORTABLE CHEST 1 VIEW COMPARISON:  07/18/2020 FINDINGS: Hardware in the cervical spine. Calcified hilar nodes and calcified lung granulomas. No acute pleural effusion or pneumothorax. Small vague right lower lung opacity. Stable cardiomediastinal silhouette. IMPRESSION: Prior granulomatous disease. Small vague focus of opacity in the right lower lung indeterminate for infection or inflammation, suggest short interval two-view chest radiograph follow-up Electronically Signed   By: Luke Bun M.D.   On: 06/02/2024 17:23   DG Elbow 2 Views Left Result Date: 06/02/2024 CLINICAL DATA:  Fall with laceration EXAM: LEFT ELBOW - 2 VIEW COMPARISON:  None Available. FINDINGS: No definitive fracture or malalignment. No significant  elbow effusion IMPRESSION: No acute osseous abnormality. Electronically Signed   By: Luke Bun M.D.   On: 06/02/2024 17:21   DG Wrist Complete Left Result Date: 06/02/2024 CLINICAL DATA:  Fall EXAM: LEFT WRIST - COMPLETE 3+ VIEW COMPARISON:  None Available. FINDINGS: Limited by habitus. Osteopenia. No gross fracture or dislocation. Severe arthritis at the first [facility] joint and STT interval. Advanced radiocarpal degenerative changes. Chondro calcinosis. IMPRESSION: 1. Limited by habitus. No gross acute osseous abnormality. 2. 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 Electronically Signed   By: Luke Bun M.D.   On: 06/02/2024 17:20   DG Shoulder Left Portable Result Date: 06/02/2024 CLINICAL DATA:  Mechanical fall EXAM: LEFT SHOULDER COMPARISON:  None Available. FINDINGS: No fracture or malalignment. Mild AC joint degenerative change. Mild calcific tendinopathy IMPRESSION: No acute osseous abnormality. Electronically Signed   By: Luke Bun M.D.   On: 06/02/2024 17:18   CT Head Wo Contrast Result Date: 06/02/2024 CLINICAL DATA:  Unwitnessed fall, fall on face, laceration EXAM: CT HEAD WITHOUT CONTRAST CT MAXILLOFACIAL WITHOUT CONTRAST CT CERVICAL SPINE WITHOUT CONTRAST TECHNIQUE: Multidetector CT imaging of the head, cervical spine, and maxillofacial structures were performed using the standard protocol without intravenous contrast. Multiplanar CT image reconstructions of the cervical spine and maxillofacial structures were also generated. RADIATION DOSE REDUCTION: This exam was performed according to the departmental dose-optimization program which includes automated exposure control, adjustment of the mA and/or kV according to patient size and/or use of iterative reconstruction technique. COMPARISON:  07/11/2020 FINDINGS: CT HEAD FINDINGS Brain: No evidence of acute infarction, hemorrhage, hydrocephalus, extra-axial collection or mass lesion/mass effect. Periventricular white matter hypodensity. Vascular: No  hyperdense vessel or unexpected calcification. CT FACIAL BONES FINDINGS Skull: Normal. Negative for fracture or focal lesion. Facial bones: No displaced fractures or dislocations. Sinuses/Orbits: No acute finding. Other: None. CT CERVICAL SPINE FINDINGS Alignment: Degenerative and postoperative straightening of the normal cervical lordosis. Degenerative anterolisthesis of C7 on T1 Skull base and vertebrae: No acute fracture. No primary bone lesion or focal pathologic process. Soft tissues and spinal canal: No prevertebral fluid or swelling. No visible canal hematoma. Disc levels: Anterior cervical discectomy and fusion of C4-C5 with ankylosis of C5-C6. Severe disc degenerative change of the remaining cervical levels. Upper chest: Negative. Other: None. IMPRESSION: 1. No acute intracranial pathology. 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 2. No displaced fractures or dislocations of the facial bones. 3. No fracture or static subluxation of the cervical spine. 4. Anterior cervical discectomy and fusion of C4-C5 with ankylosis of C5-C6. Severe disc degenerative change of the remaining cervical levels. Electronically Signed   By: Marolyn JONETTA Jaksch M.D.   On: 06/02/2024 17:03   CT Cervical Spine Wo Contrast Result Date: 06/02/2024 CLINICAL DATA:  Unwitnessed fall, fall on face, laceration EXAM: CT HEAD WITHOUT CONTRAST CT MAXILLOFACIAL WITHOUT CONTRAST CT CERVICAL SPINE WITHOUT CONTRAST TECHNIQUE: Multidetector CT imaging of the head, cervical spine, and maxillofacial structures were performed using the standard protocol without intravenous contrast. Multiplanar CT image reconstructions of the cervical spine and maxillofacial structures were also generated. RADIATION DOSE REDUCTION: This exam was performed according to the departmental dose-optimization program which includes automated exposure control, adjustment of the mA and/or kV according to patient size and/or use of iterative  reconstruction technique. COMPARISON:  07/11/2020 FINDINGS: CT HEAD FINDINGS Brain: No evidence of acute infarction, hemorrhage, hydrocephalus, extra-axial collection or mass lesion/mass effect. Periventricular white matter hypodensity. Vascular: No hyperdense vessel or unexpected calcification. CT FACIAL BONES FINDINGS Skull:  Normal. Negative for fracture or focal lesion. Facial bones: No displaced fractures or dislocations. Sinuses/Orbits: No acute finding. Other: None. CT CERVICAL SPINE FINDINGS Alignment: Degenerative and postoperative straightening of the normal cervical lordosis. Degenerative anterolisthesis of C7 on T1 Skull base and vertebrae: No acute fracture. No primary bone lesion or focal pathologic process. Soft tissues and spinal canal: No prevertebral fluid or swelling. No visible canal hematoma. Disc levels: Anterior cervical discectomy and fusion of C4-C5 with ankylosis of C5-C6. Severe disc degenerative change of the remaining cervical levels. Upper chest: Negative. Other: None. IMPRESSION: 1. No acute intracranial pathology. 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 2. No displaced fractures or dislocations of the facial bones. 3. No fracture or static subluxation of the cervical spine. 4. Anterior cervical discectomy and fusion of C4-C5 with ankylosis of C5-C6. Severe disc degenerative change of the remaining cervical levels. Electronically Signed   By: Marolyn JONETTA Jaksch M.D.   On: 06/02/2024 17:03   CT Maxillofacial Wo Contrast Result Date: 06/02/2024 CLINICAL DATA:  Unwitnessed fall, fall on face, laceration EXAM: CT HEAD WITHOUT CONTRAST CT MAXILLOFACIAL WITHOUT CONTRAST CT CERVICAL SPINE WITHOUT CONTRAST TECHNIQUE: Multidetector CT imaging of the head, cervical spine, and maxillofacial structures were performed using the standard protocol without intravenous contrast. Multiplanar CT image reconstructions of the cervical spine and maxillofacial structures were also  generated. RADIATION DOSE REDUCTION: This exam was performed according to the departmental dose-optimization program which includes automated exposure control, adjustment of the mA and/or kV according to patient size and/or use of iterative reconstruction technique. COMPARISON:  07/11/2020 FINDINGS: CT HEAD FINDINGS Brain: No evidence of acute infarction, hemorrhage, hydrocephalus, extra-axial collection or mass lesion/mass effect. Periventricular white matter hypodensity. Vascular: No hyperdense vessel or unexpected calcification. CT FACIAL BONES FINDINGS Skull: Normal. Negative for fracture or focal lesion. Facial bones: No displaced fractures or dislocations. Sinuses/Orbits: No acute finding. Other: None. CT CERVICAL SPINE FINDINGS Alignment: Degenerative and postoperative straightening of the normal cervical lordosis. Degenerative anterolisthesis of C7 on T1 Skull base and vertebrae: No acute fracture. No primary bone lesion or focal pathologic process. Soft tissues and spinal canal: No prevertebral fluid or swelling. No visible canal hematoma. Disc levels: Anterior cervical discectomy and fusion of C4-C5 with ankylosis of C5-C6. Severe disc degenerative change of the remaining cervical levels. Upper chest: Negative. Other: None. IMPRESSION: 1. No acute intracranial pathology. 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 2. No displaced fractures or dislocations of the facial bones. 3. No fracture or static subluxation of the cervical spine. 4. Anterior cervical discectomy and fusion of C4-C5 with ankylosis of C5-C6. Severe disc degenerative change of the remaining cervical levels. Electronically Signed   By: Marolyn JONETTA Jaksch M.D.   On: 06/02/2024 17:03    Assessment/Plan Cough Update CBC/differential, CMP/GFR, chest x-ray AP/lateral VS and SAT O2  every shift for 72 hours O2 via nasal cannula to maintain Sat O2 greater than 90% Tessalon 100 mg 3 times daily p.o. for 72  hours DuoNeb every 8 hours for 72 hours  Edema, peripheral Mild, chronic edema BLE, takes Furosemide , 06/20/20 EF 60-65%, Bun/creat 16/0.93 06/02/24              Restless leg syndrome takes Gabapentin , off Requip. Vit B12 818 03/25/23(taking Vit B12)  Seizures (HCC) saw neurology, presentation of left sided weakness again. EEG showed focal seizure right frontal region. Takes Keppra  since 07/23/20.  Hypothyroidism taking Levothyroxine , TSH 1.72 03/30/24  Hyponatremia  stable, Na 134 06/02/24  at baseline, declined Nacl in the past.   Insomnia secondary to anxiety not sleeping well, prn Lorazepamavailable to her, failed Mirtazapine, Lexapro, Ambien .   Slow transit constipation Stable, takes MiraLax , Senokot S  HTN (hypertension), benign Loose Bp control, continue to monitor Bps and s/s of hypertensive symptoms.     Family/ staff Communication: Plan of care reviewed with the patient and charge nurse  Labs/tests ordered: Chest x-ray AP/lateral, CBC/differential, CMP/GFR

## 2024-06-20 NOTE — Assessment & Plan Note (Signed)
 takes Gabapentin, off Requip. Vit B12 818 03/25/23(taking Vit B12)

## 2024-06-20 NOTE — Assessment & Plan Note (Signed)
Stable, takes MiraLax, Senokot S 

## 2024-06-20 NOTE — Assessment & Plan Note (Signed)
 not sleeping well, prn Lorazepamavailable to her, failed Mirtazapine, Lexapro, Ambien .

## 2024-06-20 NOTE — Assessment & Plan Note (Signed)
 stable, Na 134 06/02/24 at baseline, declined Nacl in the past.

## 2024-06-20 NOTE — Assessment & Plan Note (Signed)
 taking Levothyroxine , TSH 1.72 03/30/24

## 2024-06-21 ENCOUNTER — Non-Acute Institutional Stay: Admitting: Orthopedic Surgery

## 2024-06-21 ENCOUNTER — Encounter: Payer: Self-pay | Admitting: Orthopedic Surgery

## 2024-06-21 DIAGNOSIS — E871 Hypo-osmolality and hyponatremia: Secondary | ICD-10-CM

## 2024-06-21 DIAGNOSIS — R051 Acute cough: Secondary | ICD-10-CM | POA: Diagnosis not present

## 2024-06-21 MED ORDER — DOXYCYCLINE HYCLATE 100 MG PO TABS: 100.0000 mg | ORAL_TABLET | Freq: Two times a day (BID) | ORAL | Status: AC

## 2024-06-21 MED ORDER — SODIUM CHLORIDE 1 G PO TABS: 1.0000 g | ORAL_TABLET | ORAL | Status: DC

## 2024-06-21 MED ORDER — GUAIFENESIN ER 600 MG PO TB12: 600.0000 mg | ORAL_TABLET | Freq: Two times a day (BID) | ORAL | Status: AC

## 2024-06-21 NOTE — Progress Notes (Signed)
 Location:   [facility]ing Home Room Number: 906-A Place of Service:  ALF [phone]) Provider:  Greig Cluster, NP  PCP: Sherlynn Madden, MD  Patient Care Team: Sherlynn Madden, MD as PCP - General (Internal Medicine)  Extended Emergency Contact Information Primary Emergency Contact: Hertzwig,Kevin Address: [address]          [address]          [place], [place] [address] [place] Home Phone: [phone] Mobile Phone: [phone] Relation: Son Secondary Emergency Contact: Hertzwig,Eric Address: [address] [place] , [place] [address] [place] Home Phone: [phone] Mobile Phone: [phone] Relation: Son  Code Status:  DNR Goals of care: Advanced Directive information    06/21/2024   12:23 PM  Advanced Directives  Does Patient Have a Medical Advance Directive? Yes  Type of Advance Directive Living will;Out of facility DNR (pink MOST or yellow form)  Does patient want to make changes to medical advance directive? No - Patient declined     Chief Complaint  Patient presents with   Cough   Hyponatremia    HPI:  Pt is a 89 y.o. female seen today for acute visit due to hyponatremia and ongoing cough.   She currently resides on the assisted living unit at [facility]: PMH: CVA, HTN, HLD, TIA, venous insufficiency, GERD, neuropathy, OA, osteoporosis, memory deficit and unstable gait.   Recent Na+ 128 (07/22), previous Na+ 132 (06/17). She was on sodium tablets but refused to take them due to lower leg swelling. Denies N/V, lethargy, HA, confusion. After more discussion and education about hyponatremia, she is willing to try sodium tablets every other day.   07/22 she was seen for acute cough and fatigue. CXR noted early changes indicating infrahilar atelectasis or pneumonia. WBC was 13.3. She continues to feel short of breath and fatigued. Cough is productive and tan. Denies chest pain.    Past Medical History:   Diagnosis Date   Abdominal pain 02/26/2017   Anxiety    Arthritis    Left Knee, Wrist, Back and Hips   Cervical vertebral fusion    Diverticulitis    Diverticulosis    Fibromyalgia    GERD (gastroesophageal reflux disease)    Gout 02/2018   L hand   Hypertension     after d/c from hospital no current problems or medication   Hypothyroidism    Pelvis fracture (HCC)    Peritoneal free air 03/29/2012   Pneumonia    Pneumoperitoneum 02/26/2017   Pre-diabetes    Seizure (HCC)    Thyroid  disorder    Toe pain 09/06/2016   Past Surgical History:  Procedure Laterality Date   ABDOMINAL HYSTERECTOMY     BOWEL RESECTION N/A 07/09/2017   Procedure: SMALL BOWEL RESECTION;  Surgeon: Gladis Cough, MD;  Location: [facility];  Service: General;  Laterality: N/A;   CERVICAL FUSION     x2   CONVERSION TO TOTAL HIP Left 10/31/2020   Procedure: CONVERSION TO ANTERIOR TOTAL HIP;  Surgeon: Ernie Cough, MD;  Location: [facility];  Service: Orthopedics;  Laterality: Left;  2 hrs   FEMUR IM NAIL Left 03/03/2020   Procedure: INTRAMEDULLARY (IM) NAIL FEMORAL;  Surgeon: Ernie Cough, MD;  Location: [facility];  Service: Orthopedics;  Laterality: Left;   LAPAROSCOPIC APPENDECTOMY N/A 03/04/2017   Procedure: APPENDECTOMY LAPAROSCOPIC;  Surgeon: Cough Gladis, MD;  Location: [facility];  Service: General;  Laterality: N/A;  LAPAROSCOPY N/A 03/04/2017   Procedure: LAPAROSCOPY DIAGNOSTIC, ENTEROLYSIS;  Surgeon: Donnice Lunger, MD;  Location: [facility];  Service: General;  Laterality: N/A;   LAPAROTOMY N/A 07/09/2017   Procedure: EXPLORATORY LAPAROTOMY;  Surgeon: Lunger Donnice, MD;  Location: [facility];  Service: General;  Laterality: N/A;   SPINE SURGERY     Lumbar- rod placement   TONSILLECTOMY     89y/o   TOTAL VAGINAL HYSTERECTOMY      Allergies  Allergen Reactions   Codeine Rash   Penicillins Rash    Has patient had a PCN reaction causing immediate rash, facial/tongue/throat swelling, SOB or lightheadedness with  hypotension: No Has patient had a PCN reaction causing severe rash involving mucus membranes or skin necrosis: No Has patient had a PCN reaction that required hospitalization: No Has patient had a PCN reaction occurring within the last 10 years: No If all of the above answers are NO, then may proceed with Cephalosporin use.  Tolerated Cephalosporin 10/31/20     Levaquin [Levofloxacin]     Allergies as of 06/21/2024       Reactions   Codeine Rash   Penicillins Rash   Has patient had a PCN reaction causing immediate rash, facial/tongue/throat swelling, SOB or lightheadedness with hypotension: No Has patient had a PCN reaction causing severe rash involving mucus membranes or skin necrosis: No Has patient had a PCN reaction that required hospitalization: No Has patient had a PCN reaction occurring within the last 10 years: No If all of the above answers are NO, then may proceed with Cephalosporin use. Tolerated Cephalosporin 10/31/20   Levaquin [levofloxacin]         Medication List        Accurate as of June 21, 2024 12:47 PM. If you have any questions, ask your nurse or doctor.          acetaminophen  500 MG tablet Commonly known as: TYLENOL  Take 1,000 mg by mouth 2 (two) times daily as needed.   aspirin  EC 81 MG tablet Take 81 mg by mouth daily. Swallow whole.   benzonatate 100 MG capsule Commonly known as: TESSALON Take 1 capsule by mouth 3 (three) times daily.   cyanocobalamin 1000 MCG tablet Commonly known as: VITAMIN B12 Take 1,000 mcg by mouth daily.   escitalopram 5 MG tablet Commonly known as: LEXAPRO Take 5 mg by mouth daily.   furosemide  20 MG tablet Commonly known as: LASIX  Take 20 mg by mouth every other day.   FUROSEMIDE  PO Take 30 mg by mouth every other day. See other entry also   gabapentin  100 MG capsule Commonly known as: NEURONTIN  Take 1 capsule (100 mg total) by mouth at bedtime.   ibandronate 150 MG tablet Commonly known as:  BONIVA Take 150 mg by mouth every 30 (thirty) days. Take in the morning with a full glass of water , on an empty stomach, and do not take anything else by mouth or lie down for the next 30 min.   ibuprofen  200 MG tablet Commonly known as: ADVIL  Take 400 mg by mouth daily as needed.   ipratropium-albuterol 0.5-2.5 (3) MG/3ML Soln Commonly known as: DUONEB Take 3 mLs by nebulization every 8 (eight) hours.   levETIRAcetam  500 MG 24 hr tablet Commonly known as: Keppra  XR Take 1 tablet (500 mg total) by mouth daily.   levothyroxine  75 MCG tablet Commonly known as: SYNTHROID  Take 1 tablet (75 mcg total) by mouth daily before breakfast.   LORazepam  0.5 MG tablet Commonly known as:  ATIVAN  Take 0.5 mg by mouth every 8 (eight) hours as needed for anxiety.   LORazepam  0.5 MG tablet Commonly known as: Ativan  Take 0.5 tablets (0.25 mg total) by mouth 2 (two) times daily as needed for anxiety.   MELATONIN GUMMIES PO Take 5 mg by mouth at bedtime.   meloxicam 7.5 MG tablet Commonly known as: MOBIC Take 7.5 mg by mouth every 12 (twelve) hours as needed for pain.   Multivitamin Tabs Take 1 tablet by mouth daily.   mupirocin  ointment 2 % Commonly known as: BACTROBAN  Apply 1 Application topically 2 (two) times daily as needed. Apply to irritated area topically as needed for irritation.   Olopatadine HCl 0.7 % Soln Place 1 drop into both eyes as needed (One drop to each eye).   omeprazole 20 MG capsule Commonly known as: PRILOSEC Take 20 mg by mouth as needed.   Oyster Shell 500 MG Tabs Take 1 tablet by mouth daily.   polyethylene glycol 17 g packet Commonly known as: MIRALAX  / GLYCOLAX  Take 17 g by mouth daily as needed.   sennosides-docusate sodium  8.6-50 MG tablet Commonly known as: SENOKOT-S Take 1-2 tablets by mouth daily.   sennosides-docusate sodium  8.6-50 MG tablet Commonly known as: SENOKOT-S Take 1 tablet by mouth daily as needed for constipation.   sodium fluoride   1.1 % Gel dental gel Commonly known as: FLUORISHIELD Place 1 Application onto teeth daily in the afternoon.   vitamin D3 50 MCG (2000 UT) Caps Take 2,000 Units by mouth daily.   zolpidem  5 MG tablet Commonly known as: AMBIEN  Take 5 mg by mouth at bedtime as needed for sleep.        Review of Systems  Constitutional:  Positive for fatigue. Negative for fever.  HENT:  Negative for congestion, sore throat and trouble swallowing.   Respiratory:  Positive for cough and shortness of breath. Negative for chest tightness and wheezing.   Cardiovascular:  Negative for chest pain and leg swelling.  Gastrointestinal:  Negative for nausea and vomiting.  Genitourinary: Negative.   Musculoskeletal:  Negative for myalgias.  Neurological: Negative.   Psychiatric/Behavioral: Negative.      Immunization History  Administered Date(s) Administered   Fluzone Influenza virus vaccine,trivalent (IIV3), split virus 08/11/2018, 08/03/2019   Influenza, High Dose Seasonal PF 09/01/2023   Influenza-Unspecified 09/01/2018, 09/10/2020, 09/18/2021, 09/21/2022   Moderna Covid-19 Fall Seasonal Vaccine 63yrs & older 09/15/2023   Moderna Sars-Covid-2 Vaccination 12/04/2019, 01/01/2020   Pfizer Covid-19 Vaccine Bivalent Booster 15yrs & up 10/01/2022   Pneumococcal Conjugate-13 05/29/2014   Pneumococcal Polysaccharide-23 06/14/2015   Tdap 09/14/2012   Zoster Recombinant(Shingrix) 02/10/2022, 05/11/2022   Zoster, Live 04/30/2004, 04/13/2018, 07/05/2018   Pertinent  Health Maintenance Due  Topic Date Due   DEXA SCAN  Never done   INFLUENZA VACCINE  06/30/2024      11/04/2020   12:00 PM 04/23/2021   11:34 AM 12/08/2022    9:52 AM 12/11/2022    2:50 PM 05/19/2023    9:23 AM  Fall Risk  Falls in the past year?   0 0 1  Was there an injury with Fall?   0 0 1  Fall Risk Category Calculator   0 0 2  Fall Risk Category (Retired)   Low  Low    (RETIRED) Patient Fall Risk Level High fall risk  Moderate fall risk   Low fall risk  Low fall risk    Patient at Risk for Falls Due to   No Fall Risks  No Fall Risks History of fall(s);Impaired balance/gait;Impaired mobility  Fall risk Follow up   Falls evaluation completed  Falls evaluation completed  Falls evaluation completed     Data saved with a previous flowsheet row definition   Functional Status Survey:    Vitals:   06/21/24 1222  BP: (!) 114/53  Pulse: 87  Resp: 20  Temp: 97.7 F (36.5 C)  SpO2: 94%  Weight: 99 lb 9.6 oz (45.2 kg)  Height: 4' 9 (1.448 m)   Body mass index is 21.55 kg/m. Physical Exam Vitals reviewed.  Constitutional:      General: She is not in acute distress. HENT:     Head: Normocephalic.  Eyes:     General:        Right eye: No discharge.        Left eye: No discharge.  Cardiovascular:     Rate and Rhythm: Normal rate and regular rhythm.     Pulses: Normal pulses.     Heart sounds: Normal heart sounds.  Pulmonary:     Effort: Pulmonary effort is normal. No respiratory distress.     Breath sounds: Examination of the right-middle field reveals wheezing. Examination of the left-middle field reveals wheezing. Wheezing present. No rales.  Abdominal:     General: Bowel sounds are normal.     Palpations: Abdomen is soft.  Musculoskeletal:        General: Normal range of motion.     Cervical back: Neck supple.  Skin:    General: Skin is warm.     Capillary Refill: Capillary refill takes less than 2 seconds.  Neurological:     General: No focal deficit present.     Mental Status: She is alert and oriented to person, place, and time.     Gait: Gait abnormal.  Psychiatric:        Mood and Affect: Mood normal.     Labs reviewed: Recent Labs    05/16/24 0000 06/02/24 1729 06/20/24 0000  NA 132* 134* 128*  132*  K 4.3 3.8 4.1  4.3  CL 100 97* 95*  96*  CO2 25* 24 25*  28*  GLUCOSE  --  118*  --   BUN 13 16 14  13   CREATININE 0.6 0.93 0.5  0.6  CALCIUM  9.0 10.0 9.9  9.8   Recent Labs     09/02/23 0000 05/11/24 0000 06/20/24 0000  AST  --  17 17  ALT  --  10 9  ALKPHOS  --  43 67  ALBUMIN  3.5 3.7 4.4   Recent Labs    09/02/23 0000 05/11/24 0000 06/02/24 1729 06/20/24 0000  WBC 4.7 6.1 8.9 13.3  NEUTROABS 2,571.00 3,569.00  --  10,800.00  HGB 11.6* 11.8* 12.9 12.6  HCT 35* 36 38.5 37  MCV  --   --  97.0  --   PLT 383 441* 385 457*   Lab Results  Component Value Date   TSH 1.00 09/02/2023   Lab Results  Component Value Date   HGBA1C 5.8 (H) 10/21/2020   Lab Results  Component Value Date   CHOL 171 06/20/2020   HDL 59 06/20/2020   LDLCALC 94 06/20/2020   TRIG 89 06/20/2020   CHOLHDL 2.9 06/20/2020    Significant Diagnostic Results in last 30 days:  DG Pelvis Portable Result Date: 06/02/2024 CLINICAL DATA:  Unwitnessed fall EXAM: PORTABLE PELVIS 1-2 VIEWS COMPARISON:  10/31/2020 FINDINGS: Extensive hardware in the lumbar and upper sacral spine. Left  hip replacement with normal alignment. Pubic symphysis and rami appear intact. SI joints are non widened. No acute fracture. Moderate advanced right femoroacetabular degenerative changes IMPRESSION: No acute osseous abnormality. Left hip replacement. Electronically Signed   By: Luke Bun M.D.   On: 06/02/2024 17:24   DG Chest Portable 1 View Result Date: 06/02/2024 CLINICAL DATA:  Fall EXAM: PORTABLE CHEST 1 VIEW COMPARISON:  07/18/2020 FINDINGS: Hardware in the cervical spine. Calcified hilar nodes and calcified lung granulomas. No acute pleural effusion or pneumothorax. Small vague right lower lung opacity. Stable cardiomediastinal silhouette. IMPRESSION: Prior granulomatous disease. Small vague focus of opacity in the right lower lung indeterminate for infection or inflammation, suggest short interval two-view chest radiograph follow-up Electronically Signed   By: Luke Bun M.D.   On: 06/02/2024 17:23   DG Elbow 2 Views Left Result Date: 06/02/2024 CLINICAL DATA:  Fall with laceration EXAM: LEFT ELBOW - 2  VIEW COMPARISON:  None Available. FINDINGS: No definitive fracture or malalignment. No significant elbow effusion IMPRESSION: No acute osseous abnormality. Electronically Signed   By: Luke Bun M.D.   On: 06/02/2024 17:21   DG Wrist Complete Left Result Date: 06/02/2024 CLINICAL DATA:  Fall EXAM: LEFT WRIST - COMPLETE 3+ VIEW COMPARISON:  None Available. FINDINGS: Limited by habitus. Osteopenia. No gross fracture or dislocation. Severe arthritis at the first [facility] joint and STT interval. Advanced radiocarpal degenerative changes. Chondro calcinosis. IMPRESSION: 1. Limited by habitus. No gross acute osseous abnormality. 2. 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 Electronically Signed   By: Luke Bun M.D.   On: 06/02/2024 17:20   DG Shoulder Left Portable Result Date: 06/02/2024 CLINICAL DATA:  Mechanical fall EXAM: LEFT SHOULDER COMPARISON:  None Available. FINDINGS: No fracture or malalignment. Mild AC joint degenerative change. Mild calcific tendinopathy IMPRESSION: No acute osseous abnormality. Electronically Signed   By: Luke Bun M.D.   On: 06/02/2024 17:18   CT Head Wo Contrast Result Date: 06/02/2024 CLINICAL DATA:  Unwitnessed fall, fall on face, laceration EXAM: CT HEAD WITHOUT CONTRAST CT MAXILLOFACIAL WITHOUT CONTRAST CT CERVICAL SPINE WITHOUT CONTRAST TECHNIQUE: Multidetector CT imaging of the head, cervical spine, and maxillofacial structures were performed using the standard protocol without intravenous contrast. Multiplanar CT image reconstructions of the cervical spine and maxillofacial structures were also generated. RADIATION DOSE REDUCTION: This exam was performed according to the departmental dose-optimization program which includes automated exposure control, adjustment of the mA and/or kV according to patient size and/or use of iterative reconstruction technique. COMPARISON:  07/11/2020 FINDINGS: CT HEAD FINDINGS Brain: No evidence of acute infarction, hemorrhage, hydrocephalus,  extra-axial collection or mass lesion/mass effect. Periventricular white matter hypodensity. Vascular: No hyperdense vessel or unexpected calcification. CT FACIAL BONES FINDINGS Skull: Normal. Negative for fracture or focal lesion. Facial bones: No displaced fractures or dislocations. Sinuses/Orbits: No acute finding. Other: None. CT CERVICAL SPINE FINDINGS Alignment: Degenerative and postoperative straightening of the normal cervical lordosis. Degenerative anterolisthesis of C7 on T1 Skull base and vertebrae: No acute fracture. No primary bone lesion or focal pathologic process. Soft tissues and spinal canal: No prevertebral fluid or swelling. No visible canal hematoma. Disc levels: Anterior cervical discectomy and fusion of C4-C5 with ankylosis of C5-C6. Severe disc degenerative change of the remaining cervical levels. Upper chest: Negative. Other: None. IMPRESSION: 1. No acute intracranial pathology. 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 2. No displaced fractures or dislocations of the facial bones. 3. No fracture or static subluxation of the cervical spine. 4. Anterior cervical  discectomy and fusion of C4-C5 with ankylosis of C5-C6. Severe disc degenerative change of the remaining cervical levels. Electronically Signed   By: Marolyn JONETTA Jaksch M.D.   On: 06/02/2024 17:03   CT Cervical Spine Wo Contrast Result Date: 06/02/2024 CLINICAL DATA:  Unwitnessed fall, fall on face, laceration EXAM: CT HEAD WITHOUT CONTRAST CT MAXILLOFACIAL WITHOUT CONTRAST CT CERVICAL SPINE WITHOUT CONTRAST TECHNIQUE: Multidetector CT imaging of the head, cervical spine, and maxillofacial structures were performed using the standard protocol without intravenous contrast. Multiplanar CT image reconstructions of the cervical spine and maxillofacial structures were also generated. RADIATION DOSE REDUCTION: This exam was performed according to the departmental dose-optimization program which includes automated  exposure control, adjustment of the mA and/or kV according to patient size and/or use of iterative reconstruction technique. COMPARISON:  07/11/2020 FINDINGS: CT HEAD FINDINGS Brain: No evidence of acute infarction, hemorrhage, hydrocephalus, extra-axial collection or mass lesion/mass effect. Periventricular white matter hypodensity. Vascular: No hyperdense vessel or unexpected calcification. CT FACIAL BONES FINDINGS Skull: Normal. Negative for fracture or focal lesion. Facial bones: No displaced fractures or dislocations. Sinuses/Orbits: No acute finding. Other: None. CT CERVICAL SPINE FINDINGS Alignment: Degenerative and postoperative straightening of the normal cervical lordosis. Degenerative anterolisthesis of C7 on T1 Skull base and vertebrae: No acute fracture. No primary bone lesion or focal pathologic process. Soft tissues and spinal canal: No prevertebral fluid or swelling. No visible canal hematoma. Disc levels: Anterior cervical discectomy and fusion of C4-C5 with ankylosis of C5-C6. Severe disc degenerative change of the remaining cervical levels. Upper chest: Negative. Other: None. IMPRESSION: 1. No acute intracranial pathology. 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 2. No displaced fractures or dislocations of the facial bones. 3. No fracture or static subluxation of the cervical spine. 4. Anterior cervical discectomy and fusion of C4-C5 with ankylosis of C5-C6. Severe disc degenerative change of the remaining cervical levels. Electronically Signed   By: Marolyn JONETTA Jaksch M.D.   On: 06/02/2024 17:03   CT Maxillofacial Wo Contrast Result Date: 06/02/2024 CLINICAL DATA:  Unwitnessed fall, fall on face, laceration EXAM: CT HEAD WITHOUT CONTRAST CT MAXILLOFACIAL WITHOUT CONTRAST CT CERVICAL SPINE WITHOUT CONTRAST TECHNIQUE: Multidetector CT imaging of the head, cervical spine, and maxillofacial structures were performed using the standard protocol without intravenous  contrast. Multiplanar CT image reconstructions of the cervical spine and maxillofacial structures were also generated. RADIATION DOSE REDUCTION: This exam was performed according to the departmental dose-optimization program which includes automated exposure control, adjustment of the mA and/or kV according to patient size and/or use of iterative reconstruction technique. COMPARISON:  07/11/2020 FINDINGS: CT HEAD FINDINGS Brain: No evidence of acute infarction, hemorrhage, hydrocephalus, extra-axial collection or mass lesion/mass effect. Periventricular white matter hypodensity. Vascular: No hyperdense vessel or unexpected calcification. CT FACIAL BONES FINDINGS Skull: Normal. Negative for fracture or focal lesion. Facial bones: No displaced fractures or dislocations. Sinuses/Orbits: No acute finding. Other: None. CT CERVICAL SPINE FINDINGS Alignment: Degenerative and postoperative straightening of the normal cervical lordosis. Degenerative anterolisthesis of C7 on T1 Skull base and vertebrae: No acute fracture. No primary bone lesion or focal pathologic process. Soft tissues and spinal canal: No prevertebral fluid or swelling. No visible canal hematoma. Disc levels: Anterior cervical discectomy and fusion of C4-C5 with ankylosis of C5-C6. Severe disc degenerative change of the remaining cervical levels. Upper chest: Negative. Other: None. IMPRESSION: 1. No acute intracranial pathology. 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 2. No displaced fractures or dislocations of the facial bones.  3. No fracture or static subluxation of the cervical spine. 4. Anterior cervical discectomy and fusion of C4-C5 with ankylosis of C5-C6. Severe disc degenerative change of the remaining cervical levels. Electronically Signed   By: Marolyn JONETTA Jaksch M.D.   On: 06/02/2024 17:03    Assessment/Plan 1. Acute cough (Primary) - acute cough, increased fatigue and sob x 1 day - WBC was 13.3 - CXR indicating  atelectasis or PNA - diminished lung sounds noted - will start doxy for URI/PNA - start mucinex  to help with productive cough - doxycycline  (VIBRA -TABS) 100 MG tablet; Take 1 tablet (100 mg total) by mouth 2 (two) times daily for 7 days. - guaiFENesin  (MUCINEX ) 600 MG 12 hr tablet; Take 1 tablet (600 mg total) by mouth 2 (two) times daily for 7 days.  2. Hyponatremia - Na+ 128 (07/22)> was 130 (06/17) - stopped sodium tablets due to leg swelling - she is willing to retry medication every other day - repeat bmp in 1 week - sodium chloride  1 g tablet; Take 1 tablet (1 g total) by mouth every other day.   Family/ staff Communication: plan discussed with patient and nurse  Labs/tests ordered:  bmp in 1 week

## 2024-06-23 NOTE — Assessment & Plan Note (Addendum)
 Loose Bp control, continue to monitor Bps and s/s of hypertensive symptoms.

## 2024-06-29 ENCOUNTER — Encounter (HOSPITAL_COMMUNITY): Payer: Self-pay | Admitting: *Deleted

## 2024-06-29 ENCOUNTER — Inpatient Hospital Stay (HOSPITAL_COMMUNITY)
Admission: EM | Admit: 2024-06-29 | Discharge: 2024-07-02 | DRG: 871 | Disposition: A | Discharge status: Skilled Nursing Facility | Source: Skilled Nursing Facility | Attending: Family Medicine | Admitting: Family Medicine

## 2024-06-29 ENCOUNTER — Emergency Department (HOSPITAL_COMMUNITY)

## 2024-06-29 ENCOUNTER — Other Ambulatory Visit: Payer: Self-pay

## 2024-06-29 ENCOUNTER — Inpatient Hospital Stay (HOSPITAL_COMMUNITY)

## 2024-06-29 DIAGNOSIS — Z7982 Long term (current) use of aspirin: Secondary | ICD-10-CM | POA: Diagnosis not present

## 2024-06-29 DIAGNOSIS — Z981 Arthrodesis status: Secondary | ICD-10-CM | POA: Diagnosis not present

## 2024-06-29 DIAGNOSIS — Z823 Family history of stroke: Secondary | ICD-10-CM | POA: Diagnosis not present

## 2024-06-29 DIAGNOSIS — R569 Unspecified convulsions: Secondary | ICD-10-CM

## 2024-06-29 DIAGNOSIS — A419 Sepsis, unspecified organism: Principal | ICD-10-CM | POA: Diagnosis present

## 2024-06-29 DIAGNOSIS — Z8249 Family history of ischemic heart disease and other diseases of the circulatory system: Secondary | ICD-10-CM | POA: Diagnosis not present

## 2024-06-29 DIAGNOSIS — R6520 Severe sepsis without septic shock: Secondary | ICD-10-CM | POA: Diagnosis present

## 2024-06-29 DIAGNOSIS — R6510 Systemic inflammatory response syndrome (SIRS) of non-infectious origin without acute organ dysfunction: Secondary | ICD-10-CM | POA: Diagnosis present

## 2024-06-29 DIAGNOSIS — R0902 Hypoxemia: Secondary | ICD-10-CM | POA: Diagnosis present

## 2024-06-29 DIAGNOSIS — Z79890 Hormone replacement therapy (postmenopausal): Secondary | ICD-10-CM | POA: Diagnosis not present

## 2024-06-29 DIAGNOSIS — Z825 Family history of asthma and other chronic lower respiratory diseases: Secondary | ICD-10-CM | POA: Diagnosis not present

## 2024-06-29 DIAGNOSIS — F419 Anxiety disorder, unspecified: Secondary | ICD-10-CM | POA: Diagnosis present

## 2024-06-29 DIAGNOSIS — G40909 Epilepsy, unspecified, not intractable, without status epilepticus: Secondary | ICD-10-CM | POA: Diagnosis present

## 2024-06-29 DIAGNOSIS — J189 Pneumonia, unspecified organism: Secondary | ICD-10-CM | POA: Diagnosis present

## 2024-06-29 DIAGNOSIS — R410 Disorientation, unspecified: Secondary | ICD-10-CM

## 2024-06-29 DIAGNOSIS — E871 Hypo-osmolality and hyponatremia: Secondary | ICD-10-CM | POA: Diagnosis present

## 2024-06-29 DIAGNOSIS — Z1152 Encounter for screening for COVID-19: Secondary | ICD-10-CM

## 2024-06-29 DIAGNOSIS — E876 Hypokalemia: Secondary | ICD-10-CM | POA: Diagnosis present

## 2024-06-29 DIAGNOSIS — I10 Essential (primary) hypertension: Secondary | ICD-10-CM | POA: Diagnosis present

## 2024-06-29 DIAGNOSIS — Z880 Allergy status to penicillin: Secondary | ICD-10-CM | POA: Diagnosis not present

## 2024-06-29 DIAGNOSIS — M797 Fibromyalgia: Secondary | ICD-10-CM | POA: Diagnosis present

## 2024-06-29 DIAGNOSIS — R300 Dysuria: Secondary | ICD-10-CM | POA: Diagnosis present

## 2024-06-29 DIAGNOSIS — Z791 Long term (current) use of non-steroidal anti-inflammatories (NSAID): Secondary | ICD-10-CM

## 2024-06-29 DIAGNOSIS — E039 Hypothyroidism, unspecified: Secondary | ICD-10-CM | POA: Diagnosis present

## 2024-06-29 DIAGNOSIS — Z881 Allergy status to other antibiotic agents status: Secondary | ICD-10-CM

## 2024-06-29 DIAGNOSIS — Z885 Allergy status to narcotic agent status: Secondary | ICD-10-CM

## 2024-06-29 DIAGNOSIS — Z90710 Acquired absence of both cervix and uterus: Secondary | ICD-10-CM

## 2024-06-29 DIAGNOSIS — K219 Gastro-esophageal reflux disease without esophagitis: Secondary | ICD-10-CM | POA: Diagnosis present

## 2024-06-29 DIAGNOSIS — Z888 Allergy status to other drugs, medicaments and biological substances status: Secondary | ICD-10-CM

## 2024-06-29 DIAGNOSIS — Z79899 Other long term (current) drug therapy: Secondary | ICD-10-CM

## 2024-06-29 DIAGNOSIS — I4891 Unspecified atrial fibrillation: Secondary | ICD-10-CM | POA: Diagnosis present

## 2024-06-29 DIAGNOSIS — Z87891 Personal history of nicotine dependence: Secondary | ICD-10-CM | POA: Diagnosis not present

## 2024-06-29 DIAGNOSIS — Z66 Do not resuscitate: Secondary | ICD-10-CM | POA: Diagnosis present

## 2024-06-29 DIAGNOSIS — Z803 Family history of malignant neoplasm of breast: Secondary | ICD-10-CM

## 2024-06-29 LAB — URINALYSIS, W/ REFLEX TO CULTURE (INFECTION SUSPECTED)
Bilirubin Urine: NEGATIVE
Glucose, UA: NEGATIVE mg/dL
Ketones, ur: 5 mg/dL — AB
Leukocytes,Ua: NEGATIVE
Nitrite: NEGATIVE
Protein, ur: NEGATIVE mg/dL
Specific Gravity, Urine: 1.006 (ref 1.005–1.030)
pH: 8 (ref 5.0–8.0)

## 2024-06-29 LAB — COMPREHENSIVE METABOLIC PANEL WITH GFR
ALT: 14 U/L (ref 0–44)
AST: 25 U/L (ref 15–41)
Albumin: 4.3 g/dL (ref 3.5–5.0)
Alkaline Phosphatase: 61 U/L (ref 38–126)
Anion gap: 13 (ref 5–15)
BUN: 12 mg/dL (ref 8–23)
CO2: 21 mmol/L — ABNORMAL LOW (ref 22–32)
Calcium: 9.2 mg/dL (ref 8.9–10.3)
Chloride: 98 mmol/L (ref 98–111)
Creatinine, Ser: 0.52 mg/dL (ref 0.44–1.00)
GFR, Estimated: >60 mL/min (ref >60)
Glucose, Bld: 133 mg/dL — ABNORMAL HIGH (ref 70–99)
Potassium: 3.6 mmol/L (ref 3.5–5.1)
Sodium: 132 mmol/L — ABNORMAL LOW (ref 135–145)
Total Bilirubin: 0.9 mg/dL (ref 0.0–1.2)
Total Protein: 8 g/dL (ref 6.5–8.1)

## 2024-06-29 LAB — BRAIN NATRIURETIC PEPTIDE: B Natriuretic Peptide: 181.9 pg/mL — ABNORMAL HIGH (ref 0.0–100.0)

## 2024-06-29 LAB — CBC WITH DIFFERENTIAL/PLATELET
Abs Immature Granulocytes: 0.03 K/uL (ref 0.00–0.07)
Basophils Absolute: 0.1 K/uL (ref 0.0–0.1)
Basophils Relative: 1 %
Eosinophils Absolute: 0 K/uL (ref 0.0–0.5)
Eosinophils Relative: 1 %
HCT: 39.9 % (ref 36.0–46.0)
Hemoglobin: 12.9 g/dL (ref 12.0–15.0)
Immature Granulocytes: 0 %
Lymphocytes Relative: 7 %
Lymphs Abs: 0.5 K/uL — ABNORMAL LOW (ref 0.7–4.0)
MCH: 31.2 pg (ref 26.0–34.0)
MCHC: 32.3 g/dL (ref 30.0–36.0)
MCV: 96.4 fL (ref 80.0–100.0)
Monocytes Absolute: 0.3 K/uL (ref 0.1–1.0)
Monocytes Relative: 4 %
Neutro Abs: 7.1 K/uL (ref 1.7–7.7)
Neutrophils Relative %: 87 %
Platelets: 550 K/uL — ABNORMAL HIGH (ref 150–400)
RBC: 4.14 MIL/uL (ref 3.87–5.11)
RDW: 12.4 % (ref 11.5–15.5)
WBC: 8.1 K/uL (ref 4.0–10.5)
nRBC: 0 % (ref 0.0–0.2)

## 2024-06-29 LAB — PROTIME-INR
INR: 1 (ref 0.8–1.2)
Prothrombin Time: 13.4 s (ref 11.4–15.2)

## 2024-06-29 LAB — RESP PANEL BY RT-PCR (RSV, FLU A&B, COVID)  RVPGX2
Influenza A by PCR: NEGATIVE
Influenza B by PCR: NEGATIVE
Resp Syncytial Virus by PCR: NEGATIVE
SARS Coronavirus 2 by RT PCR: NEGATIVE

## 2024-06-29 LAB — I-STAT CG4 LACTIC ACID, ED: Lactic Acid, Venous: 1.2 mmol/L (ref 0.5–1.9)

## 2024-06-29 LAB — TSH: TSH: 0.931 u[IU]/mL (ref 0.350–4.500)

## 2024-06-29 MED ORDER — ONDANSETRON HCL 4 MG/2ML IJ SOLN: 4.0000 mg | Freq: Four times a day (QID) | INTRAMUSCULAR | Status: DC | PRN

## 2024-06-29 MED ORDER — SODIUM CHLORIDE 0.9 % IV SOLN
1.0000 g | INTRAVENOUS | Status: DC
  Administered 2024-06-30 – 2024-07-01 (×2): 1 g via INTRAVENOUS
  Filled 2024-06-29 (×2): qty 10

## 2024-06-29 MED ORDER — ACETAMINOPHEN 650 MG RE SUPP: 650.0000 mg | Freq: Four times a day (QID) | RECTAL | Status: DC | PRN

## 2024-06-29 MED ORDER — ACETAMINOPHEN 500 MG PO TABS
1000.0000 mg | ORAL_TABLET | Freq: Once | ORAL | Status: AC
  Administered 2024-06-29: 1000 mg via ORAL
  Filled 2024-06-29: qty 2

## 2024-06-29 MED ORDER — ACETAMINOPHEN 325 MG PO TABS
650.0000 mg | ORAL_TABLET | Freq: Four times a day (QID) | ORAL | Status: DC | PRN
Start: 2024-06-29 — End: 2024-07-03
  Administered 2024-06-29 – 2024-07-02 (×4): 650 mg via ORAL
  Filled 2024-06-29 (×4): qty 2

## 2024-06-29 MED ORDER — TRAZODONE HCL 50 MG PO TABS
25.0000 mg | ORAL_TABLET | Freq: Every evening | ORAL | Status: DC | PRN
  Administered 2024-06-29 – 2024-07-01 (×3): 25 mg via ORAL
  Filled 2024-06-29 (×3): qty 1

## 2024-06-29 MED ORDER — ALBUTEROL SULFATE (2.5 MG/3ML) 0.083% IN NEBU: 2.5000 mg | INHALATION_SOLUTION | RESPIRATORY_TRACT | Status: DC | PRN

## 2024-06-29 MED ORDER — VANCOMYCIN HCL IN DEXTROSE 1-5 GM/200ML-% IV SOLN
1000.0000 mg | Freq: Once | INTRAVENOUS | Status: AC
  Administered 2024-06-29: 1000 mg via INTRAVENOUS
  Filled 2024-06-29: qty 200

## 2024-06-29 MED ORDER — LACTATED RINGERS IV BOLUS (SEPSIS)
1000.0000 mL | Freq: Once | INTRAVENOUS | Status: AC
  Administered 2024-06-29: 1000 mL via INTRAVENOUS

## 2024-06-29 MED ORDER — SODIUM CHLORIDE 0.9 % IV SOLN
2.0000 g | Freq: Once | INTRAVENOUS | Status: AC
  Administered 2024-06-29: 2 g via INTRAVENOUS
  Filled 2024-06-29: qty 12.5

## 2024-06-29 MED ORDER — ENOXAPARIN SODIUM 30 MG/0.3ML IJ SOSY
30.0000 mg | PREFILLED_SYRINGE | INTRAMUSCULAR | Status: DC
  Administered 2024-06-30 – 2024-07-01 (×2): 30 mg via SUBCUTANEOUS
  Filled 2024-06-29 (×3): qty 0.3

## 2024-06-29 MED ORDER — SODIUM CHLORIDE 0.9 % IV SOLN
500.0000 mg | INTRAVENOUS | Status: DC
  Administered 2024-06-29 – 2024-06-30 (×2): 500 mg via INTRAVENOUS
  Filled 2024-06-29 (×4): qty 5

## 2024-06-29 MED ORDER — LACTATED RINGERS IV BOLUS (SEPSIS)
500.0000 mL | Freq: Once | INTRAVENOUS | Status: AC
  Administered 2024-06-29: 500 mL via INTRAVENOUS

## 2024-06-29 MED ORDER — ONDANSETRON HCL 4 MG PO TABS: 4.0000 mg | ORAL_TABLET | Freq: Four times a day (QID) | ORAL | Status: DC | PRN

## 2024-06-29 MED ORDER — ENOXAPARIN SODIUM 40 MG/0.4ML IJ SOSY: 40.0000 mg | PREFILLED_SYRINGE | INTRAMUSCULAR | Status: DC

## 2024-06-29 MED ORDER — METRONIDAZOLE 500 MG/100ML IV SOLN
500.0000 mg | Freq: Once | INTRAVENOUS | Status: AC
  Administered 2024-06-29: 500 mg via INTRAVENOUS
  Filled 2024-06-29: qty 100

## 2024-06-29 NOTE — Plan of Care (Signed)
   Problem: Education: Goal: Knowledge of General Education information will improve Description Including pain rating scale, medication(s)/side effects and non-pharmacologic comfort measures Outcome: Progressing

## 2024-06-29 NOTE — ED Provider Notes (Signed)
 [facility] EMERGENCY DEPARTMENT AT [facility] Provider Note   CSN: [id number] Arrival date & time: 06/29/24  1005     Patient presents with: Sepsis   Sara Davila is a 89 y.o. female.   HPI Patient presents EMS with concern for altered mental status, weakness. Patient's history is notable for recent therapy for pneumonia, reportedly completing antibiotics yesterday.  Typically patient is ANO x 4.  Staff at the facility noted she was in no x 2 today with increased work of breathing, feverishness.  Patient cannot provide any details, level 5 caveat.  EMS reports patient was hypertensive, subjectively warm, and hypoxic, 92% on room air on their assessment.    Prior to Admission medications   Medication Sig Start Date End Date Taking? Authorizing Provider  acetaminophen  (TYLENOL ) 500 MG tablet Take 1,000 mg by mouth 2 (two) times daily as needed.    [provider]  aspirin  EC 81 MG tablet Take 81 mg by mouth daily. Swallow whole.    [provider]  benzonatate (TESSALON) 100 MG capsule Take 1 capsule by mouth 3 (three) times daily.    [provider]  cyanocobalamin (VITAMIN B12) 1000 MCG tablet Take 1,000 mcg by mouth daily.    [provider]  furosemide  (LASIX ) 20 MG tablet Take 20 mg by mouth every other day.    [provider]  gabapentin  (NEURONTIN ) 100 MG capsule Take 1 capsule (100 mg total) by mouth at bedtime. 04/27/22   Ines Onetha NOVAK, MD  ibuprofen  (ADVIL ) 200 MG tablet Take 400 mg by mouth daily as needed.    [provider]  ipratropium-albuterol  (DUONEB) 0.5-2.5 (3) MG/3ML SOLN Take 3 mLs by nebulization every 8 (eight) hours.    [provider]  levETIRAcetam  (KEPPRA  XR) 500 MG 24 hr tablet Take 1 tablet (500 mg total) by mouth daily. 04/27/22   Ines Onetha NOVAK, MD  levothyroxine  (SYNTHROID ) 75 MCG tablet Take 1 tablet (75 mcg total) by mouth daily before breakfast. 06/15/20   Charlanne Fredia CROME, MD   LORazepam  (ATIVAN ) 0.5 MG tablet Take 0.5 mg by mouth every 8 (eight) hours as needed for anxiety.    [provider]  MELATONIN GUMMIES PO Take 5 mg by mouth at bedtime.    [provider]  meloxicam (MOBIC) 7.5 MG tablet Take 7.5 mg by mouth every 12 (twelve) hours as needed for pain.    [provider]  Multiple Vitamin (MULTIVITAMIN) TABS Take 1 tablet by mouth daily.    [provider]  mupirocin  ointment (BACTROBAN ) 2 % Apply 1 Application topically 2 (two) times daily as needed. Apply to irritated area topically as needed for irritation.    [provider]  Olopatadine HCl 0.7 % SOLN Place 1 drop into both eyes as needed (One drop to each eye).    [provider]  omeprazole (PRILOSEC) 20 MG capsule Take 20 mg by mouth as needed.    [provider]  Oyster Shell 500 MG TABS Take 1 tablet by mouth daily.    [provider]  polyethylene glycol (MIRALAX  / GLYCOLAX ) 17 g packet Take 17 g by mouth daily as needed.    [provider]  sennosides-docusate sodium  (SENOKOT-S) 8.6-50 MG tablet Take 1-2 tablets by mouth daily.    [provider]  sennosides-docusate sodium  (SENOKOT-S) 8.6-50 MG tablet Take 1 tablet by mouth daily as needed for constipation.    [provider]  sodium chloride  1 g  tablet Take 1 tablet (1 g total) by mouth every other day. 06/21/24   Fargo, Amy E, NP  sodium fluoride  (FLUORISHIELD) 1.1 % GEL dental gel Place 1 Application onto teeth daily in the afternoon.    [provider]    Allergies: Codeine, Penicillins, and Levaquin [levofloxacin]    Review of Systems  Updated Vital Signs BP (!) 173/83   Pulse (!) 110   Temp (!) 101.2 F (38.4 C) (Rectal)   Resp (!) 23   Ht 4' 9 (1.448 m)   Wt 45.2 kg   SpO2 91%   BMI 21.55 kg/m   Physical Exam Vitals and nursing note reviewed.  Constitutional:      Appearance: She is well-developed. She is ill-appearing  and diaphoretic.  HENT:     Head: Normocephalic and atraumatic.  Eyes:     Conjunctiva/sclera: Conjunctivae normal.  Cardiovascular:     Rate and Rhythm: Regular rhythm. Tachycardia present.  Pulmonary:     Effort: Pulmonary effort is normal. No respiratory distress.     Breath sounds: Normal breath sounds. No stridor.  Abdominal:     General: There is no distension.  Skin:    General: Skin is warm.  Neurological:     Cranial Nerves: No cranial nerve deficit.  Psychiatric:        Mood and Affect: Mood normal.     (all labs ordered are listed, but only abnormal results are displayed) Labs Reviewed  CULTURE, BLOOD (ROUTINE X 2)  CULTURE, BLOOD (ROUTINE X 2)  COMPREHENSIVE METABOLIC PANEL WITH GFR  CBC WITH DIFFERENTIAL/PLATELET  PROTIME-INR  URINALYSIS, W/ REFLEX TO CULTURE (INFECTION SUSPECTED)  BRAIN NATRIURETIC PEPTIDE  I-STAT CG4 LACTIC ACID, ED    EKG: Sinus tach, 124, artifact abnormal  Radiology: [facility] Chest Port 1 View Result Date: 06/29/2024 CLINICAL DATA:  Questionable sepsis - evaluate for abnormality. EXAM: PORTABLE CHEST 1 VIEW COMPARISON:  06/02/2024. FINDINGS: Bilateral lung fields are clear. Bilateral costophrenic angles are clear. Stable cardio-mediastinal silhouette. No acute osseous abnormalities. The soft tissues are within normal limits. IMPRESSION: No active disease. Electronically Signed   By: Ree Molt M.D.   On: 06/29/2024 10:56     Procedures   Medications Ordered in the ED  lactated ringers  bolus 1,000 mL (has no administration in time range)    And  lactated ringers  bolus 500 mL (has no administration in time range)  ceFEPIme  (MAXIPIME ) 2 g in sodium chloride  0.9 % 100 mL IVPB (has no administration in time range)  metroNIDAZOLE  (FLAGYL ) IVPB 500 mg (has no administration in time range)  vancomycin  (VANCOCIN ) IVPB 1000 mg/200 mL premix (has no administration in time range)                                    Medical Decision  Making Patient's symptoms of altered mental status/sepsis.  With recent pneumonia concern for source patient is of advanced age as well, concern for electrolyte abnormality, dehydration.  Patient started monitoring, received fluids, x-ray  Amount and/or Complexity of Data Reviewed Independent Historian: EMS External Data Reviewed: notes. Labs: ordered. Decision-making details documented in ED Course. Radiology: ordered and independent interpretation performed. Decision-making details documented in ED Course. ECG/medicine tests: ordered and independent interpretation performed. Decision-making details documented in ED Course.  Risk OTC drugs. Prescription drug management. Decision regarding hospitalization. Diagnosis or treatment significantly limited by social determinants of health.   11:01 AM Patient's  initial vital signs concerning for sepsis with tachycardia, tachypnea, fever.  She is not hypotensive which is somewhat reassuring.  Labs pending, broad-spectrum antibiotics started.  2:41 PM Patient has defervesced, is now calm, in no distress, labs x-ray without obvious source of infection.  Patient declined CT imaging of her head, given her improved coldness, vital signs these will be reattempted.  In essence patient states that she is DNR, does not want additional studies.  Given findings concerning for sepsis, though without clear source as above, patient will require admission for monitoring, management.  CRITICAL CARE Performed by: Lamar Salen Total critical care time: 35 minutes Critical care time was exclusive of separately billable procedures and treating other patients. Critical care was necessary to treat or prevent imminent or life-threatening deterioration. Critical care was time spent personally by me on the following activities: development of treatment plan with patient and/or surrogate as well as nursing, discussions with consultants, evaluation of patient's response  to treatment, examination of patient, obtaining history from patient or surrogate, ordering and performing treatments and interventions, ordering and review of laboratory studies, ordering and review of radiographic studies, pulse oximetry and re-evaluation of patient's condition.   Final diagnoses:  Severe sepsis (HCC)  Confusion     Salen Lamar, MD 06/29/24 1443

## 2024-06-29 NOTE — H&P (Signed)
 History and Physical  Sara Davila FMW:[id number] DOB: [birth year] DOA: 06/29/2024  PCP: Sherlynn Madden, MD   Chief Complaint: Increased work of breathing  HPI: Sara Davila is a 89 y.o. female with medical history significant for hypertension, hypothyroidism and recent treatment for pneumonia as an outpatient being admitted to the hospital with SIRS likely due to pneumonia.  Patient is alert and oriented x 4 at baseline, she is a DNR.  She recently completed a course of p.o. doxycycline  just yesterday for pneumonia at her facility.  This morning apparently the patient was slightly confused, noted to be alert and oriented x 2, noted to be saturating only 93% on room air and seemed to be tachypneic.  On arrival here in the emergency department, she was noted to be febrile, tachycardic.  Triage note states that patient was in atrial fibrillation, however ER provider note documents EKG showing sinus tachycardia.  Not visible to me currently.  On the monitor, patient is currently in sinus rhythm as well.  Patient specifically denies chest pain, cough, fevers, nausea.  She initially refused CT of the head, cervical CT.  She is now agreeable to CT scan.  Review of Systems: Please see HPI for pertinent positives and negatives. A complete 10 system review of systems are otherwise negative.  Past Medical History:  Diagnosis Date   Abdominal pain 02/26/2017   Anxiety    Arthritis    Left Knee, Wrist, Back and Hips   Cervical vertebral fusion    Diverticulitis    Diverticulosis    Fibromyalgia    GERD (gastroesophageal reflux disease)    Gout 02/2018   L hand   Hypertension     after d/c from hospital no current problems or medication   Hypothyroidism    Pelvis fracture (HCC)    Peritoneal free air 03/29/2012   Pneumonia    Pneumoperitoneum 02/26/2017   Pre-diabetes    Seizure (HCC)    Thyroid  disorder    Toe pain 09/06/2016   Past Surgical History:  Procedure Laterality Date    ABDOMINAL HYSTERECTOMY     BOWEL RESECTION N/A 07/09/2017   Procedure: SMALL BOWEL RESECTION;  Surgeon: Gladis Cough, MD;  Location: [facility];  Service: General;  Laterality: N/A;   CERVICAL FUSION     x2   CONVERSION TO TOTAL HIP Left 10/31/2020   Procedure: CONVERSION TO ANTERIOR TOTAL HIP;  Surgeon: Ernie Cough, MD;  Location: [facility];  Service: Orthopedics;  Laterality: Left;  2 hrs   FEMUR IM NAIL Left 03/03/2020   Procedure: INTRAMEDULLARY (IM) NAIL FEMORAL;  Surgeon: Ernie Cough, MD;  Location: [facility];  Service: Orthopedics;  Laterality: Left;   LAPAROSCOPIC APPENDECTOMY N/A 03/04/2017   Procedure: APPENDECTOMY LAPAROSCOPIC;  Surgeon: Cough Gladis, MD;  Location: [facility];  Service: General;  Laterality: N/A;   LAPAROSCOPY N/A 03/04/2017   Procedure: LAPAROSCOPY DIAGNOSTIC, ENTEROLYSIS;  Surgeon: Cough Gladis, MD;  Location: [facility];  Service: General;  Laterality: N/A;   LAPAROTOMY N/A 07/09/2017   Procedure: EXPLORATORY LAPAROTOMY;  Surgeon: Gladis Cough, MD;  Location: [facility];  Service: General;  Laterality: N/A;   SPINE SURGERY     Lumbar- rod placement   TONSILLECTOMY     89y/o   TOTAL VAGINAL HYSTERECTOMY     Social History:  reports that she has quit smoking. She has never used smokeless tobacco. She reports that she does not currently use alcohol. She reports that she does not use drugs.  Allergies  Allergen Reactions  Codeine Rash   Penicillins Rash    Has patient had a PCN reaction causing immediate rash, facial/tongue/throat swelling, SOB or lightheadedness with hypotension: No Has patient had a PCN reaction causing severe rash involving mucus membranes or skin necrosis: No Has patient had a PCN reaction that required hospitalization: No Has patient had a PCN reaction occurring within the last 10 years: No If all of the above answers are NO, then may proceed with Cephalosporin use.  Tolerated Cephalosporin 10/31/20     Levaquin [Levofloxacin]     Family  History  Problem Relation Age of Onset   Asthma Mother    Pulmonary embolism Mother    Macular degeneration Mother    Heart disease Father    Stroke Father    Stroke Paternal Uncle    Breast cancer Maternal Aunt    Macular degeneration Sister    Stroke Sister    Neuropathy Neg Hx      Prior to Admission medications   Medication Sig Start Date End Date Taking? Authorizing Provider  acetaminophen  (TYLENOL ) 500 MG tablet Take 1,000 mg by mouth 2 (two) times daily as needed.    [provider]  aspirin  EC 81 MG tablet Take 81 mg by mouth daily. Swallow whole.    [provider]  benzonatate (TESSALON) 100 MG capsule Take 1 capsule by mouth 3 (three) times daily.    [provider]  cyanocobalamin (VITAMIN B12) 1000 MCG tablet Take 1,000 mcg by mouth daily.    [provider]  furosemide  (LASIX ) 20 MG tablet Take 20 mg by mouth every other day.    [provider]  gabapentin  (NEURONTIN ) 100 MG capsule Take 1 capsule (100 mg total) by mouth at bedtime. 04/27/22   Ines Onetha NOVAK, MD  ibuprofen  (ADVIL ) 200 MG tablet Take 400 mg by mouth daily as needed.    [provider]  ipratropium-albuterol  (DUONEB) 0.5-2.5 (3) MG/3ML SOLN Take 3 mLs by nebulization every 8 (eight) hours.    [provider]  levETIRAcetam  (KEPPRA  XR) 500 MG 24 hr tablet Take 1 tablet (500 mg total) by mouth daily. 04/27/22   Ines Onetha NOVAK, MD  levothyroxine  (SYNTHROID ) 75 MCG tablet Take 1 tablet (75 mcg total) by mouth daily before breakfast. 06/15/20   Charlanne Fredia CROME, MD  LORazepam  (ATIVAN ) 0.5 MG tablet Take 0.5 mg by mouth every 8 (eight) hours as needed for anxiety.    [provider]  MELATONIN GUMMIES PO Take 5 mg by mouth at bedtime.    [provider]  meloxicam (MOBIC) 7.5 MG tablet Take 7.5 mg by mouth every 12 (twelve) hours as needed for pain.    [provider]  Multiple Vitamin (MULTIVITAMIN) TABS Take 1 tablet by  mouth daily.    [provider]  mupirocin  ointment (BACTROBAN ) 2 % Apply 1 Application topically 2 (two) times daily as needed. Apply to irritated area topically as needed for irritation.    [provider]  Olopatadine HCl 0.7 % SOLN Place 1 drop into both eyes as needed (One drop to each eye).    [provider]  omeprazole (PRILOSEC) 20 MG capsule Take 20 mg by mouth as needed.    [provider]  Oyster Shell 500 MG TABS Take 1 tablet by mouth daily.    [provider]  polyethylene glycol (MIRALAX  / GLYCOLAX ) 17 g packet Take 17 g by mouth daily as needed.    [provider]  sennosides-docusate sodium  (  SENOKOT-S) 8.6-50 MG tablet Take 1-2 tablets by mouth daily.    [provider]  sennosides-docusate sodium  (SENOKOT-S) 8.6-50 MG tablet Take 1 tablet by mouth daily as needed for constipation.    [provider]  sodium chloride  1 g tablet Take 1 tablet (1 g total) by mouth every other day. 06/21/24   Fargo, Amy E, NP  sodium fluoride  (FLUORISHIELD) 1.1 % GEL dental gel Place 1 Application onto teeth daily in the afternoon.    [provider]    Physical Exam: BP (!) 144/110 (BP Location: Right Arm)   Pulse (!) 108   Temp 98.3 F (36.8 C) (Oral)   Resp (!) 24   Ht 4' 9 (1.448 m)   Wt 45.2 kg   SpO2 94%   BMI 21.55 kg/m  General: Thin elderly woman appearing her stated age, alert, oriented x4, calm, in no acute distress, resting comfortably on room air Cardiovascular: Tachycardic and regular, no murmurs or rubs, she has minimal peripheral edema  Respiratory: clear to auscultation bilaterally, no wheezes, no crackles  Abdomen: soft, nontender, nondistended, normal bowel tones heard  Skin: dry, no rashes  Musculoskeletal: no joint effusions, normal range of motion  Psychiatric: appropriate affect, normal speech  Neurologic: extraocular muscles intact, clear speech, moving all extremities with intact  sensorium         Labs on Admission:  Basic Metabolic Panel: Recent Labs  Lab 06/29/24 1037  NA 132*  K 3.6  CL 98  CO2 21*  GLUCOSE 133*  BUN 12  CREATININE 0.52  CALCIUM  9.2   Liver Function Tests: Recent Labs  Lab 06/29/24 1037  AST 25  ALT 14  ALKPHOS 61  BILITOT 0.9  PROT 8.0  ALBUMIN  4.3   No results for input(s): LIPASE, AMYLASE in the last 168 hours. No results for input(s): AMMONIA in the last 168 hours. CBC: Recent Labs  Lab 06/29/24 1037  WBC 8.1  NEUTROABS 7.1  HGB 12.9  HCT 39.9  MCV 96.4  PLT 550*   Cardiac Enzymes: No results for input(s): CKTOTAL, CKMB, CKMBINDEX, TROPONINI in the last 168 hours. BNP (last 3 results) Recent Labs    06/29/24 1037  BNP 181.9*    ProBNP (last 3 results) No results for input(s): PROBNP in the last 8760 hours.  CBG: No results for input(s): GLUCAP in the last 168 hours.  Radiological Exams on Admission: DG Pelvis 1-2 Views Result Date: 06/29/2024 CLINICAL DATA:  Status post fall EXAM: PELVIS - 1 VIEW COMPARISON:  Pelvis radiograph dated 06/02/2024 FINDINGS: Left hip arthroplasty hardware appears intact. Partially imaged lumbar spinal fusion hardware appears intact. There is no evidence of pelvic fracture or diastasis. No pelvic bone lesions are seen. Degenerative changes of the right hip. IMPRESSION: 1. No acute fracture or dislocation. 2. Left hip arthroplasty hardware appears intact. Electronically Signed   By: Limin  Xu M.D.   On: 06/29/2024 13:12   DG Chest Port 1 View Result Date: 06/29/2024 CLINICAL DATA:  Questionable sepsis - evaluate for abnormality. EXAM: PORTABLE CHEST 1 VIEW COMPARISON:  06/02/2024. FINDINGS: Bilateral lung fields are clear. Bilateral costophrenic angles are clear. Stable cardio-mediastinal silhouette. No acute osseous abnormalities. The soft tissues are within normal limits. IMPRESSION: No active disease. Electronically Signed   By: Ree Molt M.D.   On:  06/29/2024 10:56   Assessment/Plan Sara Davila is a 89 y.o. female with medical history significant for hypertension, hypothyroidism and recent treatment for pneumonia as an outpatient being admitted  to the hospital with SIRS likely due to pneumonia.  SIRS-patient presents with fever, tachycardia.  No evidence of endorgan dysfunction, renal function and lactic acid level are normal.  Patient is hemodynamically stable. -Inpatient admission -Empiric treatment as below -Follow-up blood cultures  Community-acquired pneumonia-suspected given recent treatment for possible pneumonia, borderline hypoxia, and report of tachypnea/shortness of breath -Follow-up CT chest -Empiric IV azithromycin  and IV Rocephin   Hypothyroidism-continue Synthroid   Seizure disorder-continue Keppra   DVT prophylaxis: Lovenox      Code Status: Do not attempt resuscitation (DNR) PRE-ARREST INTERVENTIONS DESIRED, goldenrod form at the bedside reviewed.  Consults called: None  Admission status: The appropriate patient status for this patient is INPATIENT. Inpatient status is judged to be reasonable and necessary in order to provide the required intensity of service to ensure the patient's safety. The patient's presenting symptoms, physical exam findings, and initial radiographic and laboratory data in the context of their chronic comorbidities is felt to place them at high risk for further clinical deterioration. Furthermore, it is not anticipated that the patient will be medically stable for discharge from the hospital within 2 midnights of admission.    I certify that at the point of admission it is my clinical judgment that the patient will require inpatient hospital care spanning beyond 2 midnights from the point of admission due to high intensity of service, high risk for further deterioration and high frequency of surveillance required  Time spent: 49 minutes  Dodge Ator CHRISTELLA Gail MD [facility] Pager  [phone]  If 7PM-7AM, please contact night-coverage [email or web address] Password [email or web address]  06/29/2024, 3:07 PM

## 2024-06-29 NOTE — ED Notes (Signed)
 Xray at [facility]

## 2024-06-29 NOTE — ED Triage Notes (Addendum)
 BIB EMS after recent Pneumonia (completed antibiotics) from [facility]. Pt confused, warm to touch, A-fib (no noted history) fell last night unsure of cause. 180/99-90-96% 2L 02 24 CbG 130 #18 L AC DNR with pt

## 2024-06-29 NOTE — Plan of Care (Signed)
 Pt refused stat CT, tried explaining the need for same pt still refuses. States No thank you, I don't want it pt A&O X4. On call provider informed.

## 2024-06-29 NOTE — Sepsis Progress Note (Signed)
 eLink is following this Code Sepsis.

## 2024-06-30 DIAGNOSIS — R6510 Systemic inflammatory response syndrome (SIRS) of non-infectious origin without acute organ dysfunction: Secondary | ICD-10-CM | POA: Diagnosis not present

## 2024-06-30 LAB — RESPIRATORY PANEL BY PCR

## 2024-06-30 LAB — BASIC METABOLIC PANEL WITH GFR
Anion gap: 3 — ABNORMAL LOW (ref 5–15)
BUN: 7 mg/dL — ABNORMAL LOW (ref 8–23)
CO2: 24 mmol/L (ref 22–32)
Calcium: 8.6 mg/dL — ABNORMAL LOW (ref 8.9–10.3)
Chloride: 100 mmol/L (ref 98–111)
Creatinine, Ser: 0.45 mg/dL (ref 0.44–1.00)
GFR, Estimated: >60 mL/min (ref >60)
Glucose, Bld: 97 mg/dL (ref 70–99)
Potassium: 3.2 mmol/L — ABNORMAL LOW (ref 3.5–5.1)
Sodium: 127 mmol/L — ABNORMAL LOW (ref 135–145)

## 2024-06-30 LAB — CBC
HCT: 35.5 % — ABNORMAL LOW (ref 36.0–46.0)
Hemoglobin: 11.7 g/dL — ABNORMAL LOW (ref 12.0–15.0)
MCH: 32.4 pg (ref 26.0–34.0)
MCHC: 33 g/dL (ref 30.0–36.0)
MCV: 98.3 fL (ref 80.0–100.0)
Platelets: 342 K/uL (ref 150–400)
RBC: 3.61 MIL/uL — ABNORMAL LOW (ref 3.87–5.11)
RDW: 12.3 % (ref 11.5–15.5)
WBC: 8.2 K/uL (ref 4.0–10.5)
nRBC: 0 % (ref 0.0–0.2)

## 2024-06-30 LAB — PROCALCITONIN: Procalcitonin: <0.1 ng/mL

## 2024-06-30 MED ORDER — POTASSIUM CHLORIDE CRYS ER 20 MEQ PO TBCR
40.0000 meq | EXTENDED_RELEASE_TABLET | Freq: Once | ORAL | Status: DC
  Filled 2024-06-30: qty 2

## 2024-06-30 MED ORDER — SODIUM CHLORIDE 1 G PO TABS
1.0000 g | ORAL_TABLET | ORAL | Status: DC
  Administered 2024-06-30: 1 g via ORAL
  Filled 2024-06-30 (×2): qty 1

## 2024-06-30 MED ORDER — LEVOTHYROXINE SODIUM 75 MCG PO TABS
75.0000 ug | ORAL_TABLET | Freq: Every day | ORAL | Status: DC
  Administered 2024-07-01 – 2024-07-02 (×2): 75 ug via ORAL
  Filled 2024-06-30 (×2): qty 1

## 2024-06-30 MED ORDER — LEVETIRACETAM ER 500 MG PO TB24
500.0000 mg | ORAL_TABLET | Freq: Every day | ORAL | Status: DC
  Administered 2024-07-01: 500 mg via ORAL
  Filled 2024-06-30 (×3): qty 1

## 2024-06-30 MED ORDER — FUROSEMIDE 20 MG PO TABS
20.0000 mg | ORAL_TABLET | ORAL | Status: DC
  Filled 2024-06-30: qty 1

## 2024-06-30 MED ORDER — POLYETHYLENE GLYCOL 3350 17 G PO PACK
17.0000 g | PACK | Freq: Once | ORAL | Status: AC
  Administered 2024-06-30: 17 g via ORAL
  Filled 2024-06-30: qty 1

## 2024-06-30 NOTE — Evaluation (Signed)
 Physical Therapy Evaluation Patient Details Name: Sara Davila MRN: [id number] DOB: February 02, 1930 Today's Date: 06/30/2024  History of Present Illness  89 y.o. female recent with treatment for pneumonia as an outpatient being admitted to the hospital with SIRS likely due to pneumonia on 06/29/24.  Past medical history significant for hypertension, hypothyroidism, CVA, neuropathy, L hip ORIF with conversion to L THA, cervical fusion, fibromyalgia, gout  Clinical Impression  Pt admitted with above diagnosis.  Pt currently with functional limitations due to the deficits listed below (see PT Problem List). Pt will benefit from acute skilled PT to increase their independence and safety with mobility to allow discharge.  Pt reports she has been mostly in bed and requiring more assist from staff at ALF prior to this admission (since her fall earlier this month).  Pt currently requiring min assist to mobilize and only tolerated ambulating approx 20 ft with RW due to generalized weakness, deconditioning and fatigue.  Pt hopes to return to ALF apartment with more care however if additional assist is not available, pt will likely need SNF upon d/c.  Patient will benefit from continued inpatient follow up therapy, <3 hours/day.          If plan is discharge home, recommend the following: A little help with walking and/or transfers;A little help with bathing/dressing/bathroom;Assistance with cooking/housework   Can travel by private vehicle        Equipment Recommendations None recommended by PT  Recommendations for Other Services       Functional Status Assessment Patient has had a recent decline in their functional status and demonstrates the ability to make significant improvements in function in a reasonable and predictable amount of time.     Precautions / Restrictions Precautions Precautions: Fall      Mobility  Bed Mobility Overal bed mobility: Needs Assistance Bed Mobility: Supine to Sit,  Sit to Supine     Supine to sit: Supervision Sit to supine: Supervision        Transfers Overall transfer level: Needs assistance Equipment used: Rolling walker (2 wheels) Transfers: Sit to/from Stand Sit to Stand: Min assist           General transfer comment: cues for hand placement, assist to rise and stabilize    Ambulation/Gait Ambulation/Gait assistance: Min assist Gait Distance (Feet): 20 Feet Assistive device: Rolling walker (2 wheels) Gait Pattern/deviations: Step-through pattern, Decreased stride length, Narrow base of support       General Gait Details: verbal cues for posture, distance limited as pt reports generalized weakness and fatigue  Stairs            Wheelchair Mobility     Tilt Bed    Modified Rankin (Stroke Patients Only)       Balance Overall balance assessment: History of Falls, Mild deficits observed, not formally tested (recent fall)                                           Pertinent Vitals/Pain Pain Assessment Pain Assessment: No/denies pain    Home Living Family/patient expects to be discharged to:: Assisted living                 Home Equipment: Agricultural consultant (2 wheels);Rollator (4 wheels)      Prior Function Prior Level of Function : Independent/Modified Independent  Mobility Comments: previously mod I with walker per pt however since her fall out of bed she has needed more assist from staff especially the past week per her report ADLs Comments: occasional assist for bathing and dressing     Extremity/Trunk Assessment        Lower Extremity Assessment Lower Extremity Assessment: Generalized weakness       Communication   Communication Communication: No apparent difficulties    Cognition Arousal: Alert Behavior During Therapy: WFL for tasks assessed/performed   PT - Cognitive impairments: No apparent impairments                          Following commands: Intact       Cueing       General Comments      Exercises     Assessment/Plan    PT Assessment Patient needs continued PT services  PT Problem List Decreased balance;Decreased activity tolerance;Decreased mobility;Decreased strength;Decreased knowledge of use of DME       PT Treatment Interventions Therapeutic exercise;Therapeutic activities;Functional mobility training;Gait training;DME instruction;Balance training;Patient/family education    PT Goals (Current goals can be found in the Care Plan section)  Acute Rehab PT Goals PT Goal Formulation: With patient Time For Goal Achievement: 07/14/24 Potential to Achieve Goals: Good    Frequency Min 2X/week     Co-evaluation               AM-PAC PT 6 Clicks Mobility  Outcome Measure Help needed turning from your back to your side while in a flat bed without using bedrails?: A Little Help needed moving from lying on your back to sitting on the side of a flat bed without using bedrails?: A Little Help needed moving to and from a bed to a chair (including a wheelchair)?: A Little Help needed standing up from a chair using your arms (e.g., wheelchair or bedside chair)?: A Little Help needed to walk in hospital room?: A Lot Help needed climbing 3-5 steps with a railing? : A Lot 6 Click Score: 16    End of Session Equipment Utilized During Treatment: Gait belt Activity Tolerance: Patient tolerated treatment well Patient left: in bed;with call bell/phone within reach;with bed alarm set   PT Visit Diagnosis: Difficulty in walking, not elsewhere classified (R26.2);Muscle weakness (generalized) (M62.81)    Time: 8579-8555 PT Time Calculation (min) (ACUTE ONLY): 24 min   Charges:   PT Evaluation $PT Eval Low Complexity: 1 Low   PT General Charges $$ ACUTE PT VISIT: 1 Visit        Tari PT, DPT Physical Therapist Acute Rehabilitation Services Office: [phone]   Tari CROME  [place] 06/30/2024, 4:44 PM

## 2024-06-30 NOTE — NC FL2 (Signed)
 [place]  MEDICAID FL2 LEVEL OF CARE FORM     IDENTIFICATION  Patient Name: Sara Davila Birthdate: [birth year] Sex: female Admission Date (Current Location): 06/29/2024  [facility] and [place] Number:  Producer, television/film/video and Address:  [facility],  [address], [place] [address]      Provider Number: [id number]  Attending Physician Name and Address:  Briana Elgin LABOR, MD  Relative Name and Phone Number:  [place] Fridge [phone]    Current Level of Care: Hospital Recommended Level of Care: Skilled Nursing Facility Prior Approval Number:    Date Approved/Denied:   PASRR Number: [id number]  Discharge Plan: SNF    Current Diagnoses: Patient Active Problem List   Diagnosis Date Noted   SIRS (systemic inflammatory response syndrome) (HCC) 06/29/2024   Cough 06/20/2024   Physical deconditioning 06/05/2024   Frailty syndrome in geriatric patient 05/10/2024   Influenza A 01/07/2024   Edema, peripheral 07/13/2023   Urinary frequency 03/17/2023   Seborrheic dermatitis of scalp 12/08/2022   COVID-19 virus infection 07/11/2021   Urinary tract infection 04/25/2021   Hyperkalemia 04/02/2021   Allergic conjunctivitis 03/21/2021   Osteoarthritis, multiple sites 03/03/2021   Hyperlipidemia 01/08/2021   Restless leg syndrome 11/27/2020   S/P left THA, AA 10/31/2020   Seizures (HCC) 07/12/2020   Left-sided weakness 07/11/2020   History of TIA (transient ischemic attack) 07/11/2020   TIA (transient ischemic attack) 06/20/2020   Cerebrovascular accident (CVA) (HCC) 06/19/2020   Asymptomatic bacteriuria 06/19/2020   HTN (hypertension), benign 06/19/2020   Thrombocytosis 06/19/2020   Left hip pain 04/15/2020   Left knee pain 03/20/2020   Slow transit constipation 03/20/2020   Pain of toe of left foot 03/18/2020   Urinary retention 03/06/2020   Blood loss anemia 03/06/2020   History of fracture of left hip 03/03/2020   Prediabetes 01/04/2020   Memory  deficit 01/04/2020   Osteoporosis 01/04/2020   History of vasculitis 01/04/2020   Venous insufficiency 01/04/2020   Neuropathy 03/30/2018   Hyponatremia 07/21/2017   Insomnia secondary to anxiety 07/21/2017   GERD (gastroesophageal reflux disease) 07/21/2017   S/P small bowel resection 07/09/2017   Hypothyroidism 02/26/2017   Paresthesias 09/06/2016   Fall with injury 03/29/2012    Orientation RESPIRATION BLADDER Height & Weight     Self, Time, Situation, Place  Normal External catheter Weight: 48.1 kg Height:  4' 9 (144.8 cm)  BEHAVIORAL SYMPTOMS/MOOD NEUROLOGICAL BOWEL NUTRITION STATUS      Continent Diet  AMBULATORY STATUS COMMUNICATION OF NEEDS Skin   Extensive Assist Verbally Normal                       Personal Care Assistance Level of Assistance  Bathing, Feeding, Dressing Bathing Assistance: Limited assistance Feeding assistance: Limited assistance Dressing Assistance: Limited assistance     Functional Limitations Info             SPECIAL CARE FACTORS FREQUENCY  PT (By licensed PT), OT (By licensed OT)     PT Frequency: 5X Weekly OT Frequency: 5X Weekly            Contractures Contractures Info: Not present    Additional Factors Info  Code Status, Allergies, Psychotropic Code Status Info: DNR - Intervention Allergies Info: Codeine, Penicillins, Levaquin (Levofloxacin), Tuberculin Tests, Tuberculin, Ppd Psychotropic Info: Ativan          Current Medications (06/30/2024):  This is the current hospital active medication list Current Facility-Administered Medications  Medication Dose Route Frequency  Provider Last Rate Last Admin   acetaminophen  (TYLENOL ) tablet 650 mg  650 mg Oral Q6H PRN Zella, Mir M, MD   650 mg at 06/29/24 2107   Or   acetaminophen  (TYLENOL ) suppository 650 mg  650 mg Rectal Q6H PRN Zella, Mir M, MD       albuterol  (PROVENTIL ) (2.5 MG/3ML) 0.083% nebulizer solution 2.5 mg  2.5 mg Nebulization Q2H PRN Zella,  Mir M, MD       azithromycin  (ZITHROMAX ) 500 mg in sodium chloride  0.9 % 250 mL IVPB  500 mg Intravenous Q24H Zella, Mir M, MD 250 mL/hr at 06/30/24 1538 500 mg at 06/30/24 1538   cefTRIAXone  (ROCEPHIN ) 1 g in sodium chloride  0.9 % 100 mL IVPB  1 g Intravenous Q24H Zella, Mir M, MD 200 mL/hr at 06/30/24 1142 1 g at 06/30/24 1142   enoxaparin  (LOVENOX ) injection 30 mg  30 mg Subcutaneous Q24H Zella, Mir M, MD   30 mg at 06/30/24 1543   furosemide  (LASIX ) tablet 20 mg  20 mg Oral QODAY Nettey, Ralph A, MD       levETIRAcetam  (KEPPRA  XR) 24 hr tablet 500 mg  500 mg Oral Daily Briana Elgin LABOR, MD       [START ON 07/01/2024] levothyroxine  (SYNTHROID ) tablet 75 mcg  75 mcg Oral QAC breakfast Briana Elgin LABOR, MD       ondansetron  (ZOFRAN ) tablet 4 mg  4 mg Oral Q6H PRN Zella, Mir M, MD       Or   ondansetron  (ZOFRAN ) injection 4 mg  4 mg Intravenous Q6H PRN Zella, Mir M, MD       potassium chloride  SA (KLOR-CON  M) CR tablet 40 mEq  40 mEq Oral Once Briana Elgin LABOR, MD       sodium chloride  tablet 1 g  1 g Oral QODAY Briana Elgin LABOR, MD   1 g at 06/30/24 1100   traZODone  (DESYREL ) tablet 25 mg  25 mg Oral QHS PRN Zella Katha HERO, MD   25 mg at 06/29/24 2112     Discharge Medications: Please see discharge summary for a list of discharge medications.  Relevant Imaging Results:  Relevant Lab Results:   Additional Information SS# [id number]  Doneta Glenys DASEN, RN

## 2024-06-30 NOTE — Progress Notes (Signed)
 PROGRESS NOTE    Sara Davila  FMW:[id number] DOB: 1930/10/15 DOA: 06/29/2024 PCP: Sherlynn Madden, MD   Brief Narrative: Sara Davila is a 89 y.o. female with a history of hypertension, hypothyroidism, hyponatremia, seizures.  Patient presented secondary to dyspnea and increased work of breathing concerning for worsening pneumonia. Patient started empirically on antibiotics. Patient declines further workup.   Assessment and Plan:  Sepsis Present on admission, secondary to pneumonia. Blood cultures obtained and antibiotics started.  Community acquired pneumonia Presumed diagnosis as she was being treated for this as an outpatient, although chest x-ray is unremarkable for acute illness. Patient with fever, tachypnea and dyspnea. RVP negative. Procalcitonin undetectable. Patient declining further workup, but agreeable to antibiotics. -Continue Ceftriaxone  and azithromycin   Hypothyroidism -Continue Synthroid   Hypokalemia -Potassium supplementation  Chronic hyponatremia -Resume home sodium and Lasix  -Will discontinue workup labs secondary to patient's goals of care  Seizure disorder -Continue Keppra   Goals of care Patient reports readiness to die and and readiness to meet Jesus. She declines further workup this admission and requests to return to her facility.    DVT prophylaxis: Lovenox  Code Status:   Code Status: Do not attempt resuscitation (DNR) PRE-ARREST INTERVENTIONS DESIRED Family Communication: None at bedside. Friend (HCPOA) on telephone Disposition Plan: Discharge back to facility once able, per TOC.   Consultants:  None  Procedures:  None  Antimicrobials: Ceftriaxone  Azithromycin     Subjective: Patient reports no issues this morning. Ready to die and meet Jesus.  Objective: BP 126/68 (BP Location: Right Arm)   Pulse 87   Temp 98.2 F (36.8 C)   Resp 12   Ht 4' 9 (1.448 m)   Wt 48.1 kg   SpO2 96%   BMI 22.95 kg/m    Examination:  General exam: Appears calm and comfortable Respiratory system: Clear to auscultation. Respiratory effort normal. Cardiovascular system: S1 & S2 heard, RRR. No murmurs, rubs, gallops or clicks. Gastrointestinal system: Abdomen is nondistended, soft and non-tender. Normal bowel sounds heard. Central nervous system: Alert and oriented x4. No focal neurological deficits. Psychiatry: Judgement and insight appear normal. Mood & affect appropriate.    Data Reviewed: I have personally reviewed following labs and imaging studies  CBC Lab Results  Component Value Date   WBC 8.2 06/30/2024   RBC 3.61 (L) 06/30/2024   HGB 11.7 (L) 06/30/2024   HCT 35.5 (L) 06/30/2024   MCV 98.3 06/30/2024   MCH 32.4 06/30/2024   PLT 342 06/30/2024   MCHC 33.0 06/30/2024   RDW 12.3 06/30/2024   LYMPHSABS 0.5 (L) 06/29/2024   MONOABS 0.3 06/29/2024   EOSABS 0.0 06/29/2024   BASOSABS 0.1 06/29/2024     Last metabolic panel Lab Results  Component Value Date   NA 127 (L) 06/30/2024   K 3.2 (L) 06/30/2024   CL 100 06/30/2024   CO2 24 06/30/2024   BUN 7 (L) 06/30/2024   CREATININE 0.45 06/30/2024   GLUCOSE 97 06/30/2024   GFRNONAA >60 06/30/2024   GFRAA >60 07/14/2020   CALCIUM  8.6 (L) 06/30/2024   PROT 8.0 06/29/2024   ALBUMIN  4.3 06/29/2024   LABGLOB 3.1 03/30/2018   BILITOT 0.9 06/29/2024   ALKPHOS 61 06/29/2024   AST 25 06/29/2024   ALT 14 06/29/2024   ANIONGAP 3 (L) 06/30/2024    GFR: Estimated Creatinine Clearance: 30.7 mL/min (by C-G formula based on SCr of 0.45 mg/dL).  Recent Results (from the past 240 hours)  Blood Culture (routine x 2)     Status:  None (Preliminary result)   Collection Time: 06/29/24 10:37 AM   Specimen: BLOOD  Result Value Ref Range Status   Specimen Description   Final    BLOOD RIGHT ANTECUBITAL Performed at [facility], [address], [place], [place] [address]    Special Requests   Final    BOTTLES DRAWN AEROBIC AND  ANAEROBIC Blood Culture results may not be optimal due to an inadequate volume of blood received in culture bottles Performed at [facility], [address], [place], [place] [address]    Culture   Final    NO GROWTH < 24 HOURS Performed at [facility], [address], [place], [place] [address]    Report Status PENDING  Incomplete  Blood Culture (routine x 2)     Status: None (Preliminary result)   Collection Time: 06/29/24 10:50 AM   Specimen: BLOOD  Result Value Ref Range Status   Specimen Description   Final    BLOOD BLOOD LEFT HAND Performed at [facility], [address] [place], [place] [address]    Special Requests   Final    BOTTLES DRAWN AEROBIC AND ANAEROBIC Blood Culture results may not be optimal due to an inadequate volume of blood received in culture bottles Performed at [facility], [address] [place], [place] [address]    Culture   Final    NO GROWTH < 24 HOURS Performed at [facility], [address] [place], [place] [address]    Report Status PENDING  Incomplete  Resp panel by RT-PCR (RSV, Flu A&B, Covid) Urine, Catheterized     Status: None   Collection Time: 06/29/24 11:36 AM   Specimen: Urine, Catheterized; Nasal Swab  Result Value Ref Range Status   SARS Coronavirus 2 by RT PCR NEGATIVE NEGATIVE Final    Comment: (NOTE) SARS-CoV-2 target nucleic acids are NOT DETECTED.  The SARS-CoV-2 RNA is generally detectable in upper respiratory specimens during the acute phase of infection. The lowest concentration of SARS-CoV-2 viral copies this assay can detect is 138 copies/mL. A negative result does not preclude SARS-Cov-2 infection and should not be used as the sole basis for treatment or other patient management decisions. A negative result may occur with  improper specimen collection/handling, submission of specimen other than nasopharyngeal swab, presence of viral mutation(s)  within the areas targeted by this assay, and inadequate number of viral copies(<138 copies/mL). A negative result must be combined with clinical observations, patient history, and epidemiological information. The expected result is Negative.  Fact Sheet for Patients:  BloggerCourse.com  Fact Sheet for Healthcare Providers:  SeriousBroker.it  This test is no t yet approved or cleared by the [place]  FDA and  has been authorized for detection and/or diagnosis of SARS-CoV-2 by FDA under an Emergency Use Authorization (EUA). This EUA will remain  in effect (meaning this test can be used) for the duration of the COVID-19 declaration under Section 564(b)(1) of the Act, 21 U.S.C.section 360bbb-3(b)(1), unless the authorization is terminated  or revoked sooner.       Influenza A by PCR NEGATIVE NEGATIVE Final   Influenza B by PCR NEGATIVE NEGATIVE Final    Comment: (NOTE) The Xpert Xpress SARS-CoV-2/FLU/RSV plus assay is intended as an aid in the diagnosis of influenza from Nasopharyngeal swab specimens and should not be used as a sole basis for treatment. Nasal washings and aspirates are unacceptable for Xpert Xpress SARS-CoV-2/FLU/RSV testing.  Fact Sheet  for Patients: BloggerCourse.com  Fact Sheet for Healthcare Providers: SeriousBroker.it  This test is not yet approved or cleared by the [place]  FDA and has been authorized for detection and/or diagnosis of SARS-CoV-2 by FDA under an Emergency Use Authorization (EUA). This EUA will remain in effect (meaning this test can be used) for the duration of the COVID-19 declaration under Section 564(b)(1) of the Act, 21 U.S.C. section 360bbb-3(b)(1), unless the authorization is terminated or revoked.     Resp Syncytial Virus by PCR NEGATIVE NEGATIVE Final    Comment: (NOTE) Fact Sheet for  Patients: BloggerCourse.com  Fact Sheet for Healthcare Providers: SeriousBroker.it  This test is not yet approved or cleared by the [place]  FDA and has been authorized for detection and/or diagnosis of SARS-CoV-2 by FDA under an Emergency Use Authorization (EUA). This EUA will remain in effect (meaning this test can be used) for the duration of the COVID-19 declaration under Section 564(b)(1) of the Act, 21 U.S.C. section 360bbb-3(b)(1), unless the authorization is terminated or revoked.  Performed at [facility], [address], [place], [place] [address]   Respiratory (~20 pathogens) panel by PCR     Status: None   Collection Time: 06/29/24 11:36 AM   Specimen: Nasopharyngeal Swab; Respiratory  Result Value Ref Range Status   Adenovirus NOT DETECTED NOT DETECTED Final   Coronavirus 229E NOT DETECTED NOT DETECTED Final    Comment: (NOTE) The Coronavirus on the Respiratory Panel, DOES NOT test for the novel  Coronavirus (2019 nCoV)    Coronavirus HKU1 NOT DETECTED NOT DETECTED Final   Coronavirus NL63 NOT DETECTED NOT DETECTED Final   Coronavirus OC43 NOT DETECTED NOT DETECTED Final   Metapneumovirus NOT DETECTED NOT DETECTED Final   Rhinovirus / Enterovirus NOT DETECTED NOT DETECTED Final   Influenza A NOT DETECTED NOT DETECTED Final   Influenza B NOT DETECTED NOT DETECTED Final   Parainfluenza Virus 1 NOT DETECTED NOT DETECTED Final   Parainfluenza Virus 2 NOT DETECTED NOT DETECTED Final   Parainfluenza Virus 3 NOT DETECTED NOT DETECTED Final   Parainfluenza Virus 4 NOT DETECTED NOT DETECTED Final   Respiratory Syncytial Virus NOT DETECTED NOT DETECTED Final   Bordetella pertussis NOT DETECTED NOT DETECTED Final   Bordetella Parapertussis NOT DETECTED NOT DETECTED Final   Chlamydophila pneumoniae NOT DETECTED NOT DETECTED Final   Mycoplasma pneumoniae NOT DETECTED NOT DETECTED Final    Comment:  Performed at [facility], [address], [place], [place] [address]      Radiology Studies: DG Pelvis 1-2 Views Result Date: 06/29/2024 CLINICAL DATA:  Status post fall EXAM: PELVIS - 1 VIEW COMPARISON:  Pelvis radiograph dated 06/02/2024 FINDINGS: Left hip arthroplasty hardware appears intact. Partially imaged lumbar spinal fusion hardware appears intact. There is no evidence of pelvic fracture or diastasis. No pelvic bone lesions are seen. Degenerative changes of the right hip. IMPRESSION: 1. No acute fracture or dislocation. 2. Left hip arthroplasty hardware appears intact. Electronically Signed   By: Limin  Xu M.D.   On: 06/29/2024 13:12   DG Chest Port 1 View Result Date: 06/29/2024 CLINICAL DATA:  Questionable sepsis - evaluate for abnormality. EXAM: PORTABLE CHEST 1 VIEW COMPARISON:  06/02/2024. FINDINGS: Bilateral lung fields are clear. Bilateral costophrenic angles are clear. Stable cardio-mediastinal silhouette. No acute osseous abnormalities. The soft tissues are within normal limits. IMPRESSION: No active disease. Electronically Signed   By: Ree Molt M.D.   On: 06/29/2024 10:56      LOS:  1 day    Elgin Lam, MD [facility] 06/30/2024, 4:36 PM   If 7PM-7AM, please contact night-coverage [email or web address]

## 2024-06-30 NOTE — Hospital Course (Signed)
 Sara Davila is a 89 y.o. female with a history of hypertension, hypothyroidism, hyponatremia, seizures.  Patient presented secondary to dyspnea and increased work of breathing concerning for worsening pneumonia. Patient started empirically on antibiotics. Patient declines further workup.

## 2024-06-30 NOTE — Evaluation (Signed)
 Occupational Therapy Evaluation Patient Details Name: Sara Davila MRN: [id number] DOB: [birth year] Today's Date: 06/30/2024   History of Present Illness   89 y.o. female recent with treatment for pneumonia as an outpatient being admitted to the hospital with SIRS likely due to pneumonia on 06/29/24.  Past medical history significant for hypertension, hypothyroidism, CVA, neuropathy, L hip ORIF with conversion to L THA, cervical fusion, fibromyalgia, gout     Clinical Impressions PTA, patient lives at [facility] with assist for BADL's and RW safety with ambulation with hx of fall.  Currently, patient presents with deficits outlined below (see OT Problem List for details) most significantly decreased activity tolerance, balance, cognition and safety, generalized muscle weakness limiting BADL's and functional mobility. Patient requires continued Acute care hospital level OT services to progress safety and functional performance and allow for discharge. Patient will benefit from continued inpatient follow up therapy, <3 hours/day.        If plan is discharge home, recommend the following:   A lot of help with walking and/or transfers;A lot of help with bathing/dressing/bathroom;Assistance with cooking/housework;Direct supervision/assist for medications management;Direct supervision/assist for financial management;Assist for transportation;Help with stairs or ramp for entrance;Supervision due to cognitive status     Functional Status Assessment   Patient has had a recent decline in their functional status and demonstrates the ability to make significant improvements in function in a reasonable and predictable amount of time.     Equipment Recommendations   None recommended by OT      Precautions/Restrictions   Precautions Precautions: Fall Restrictions Weight Bearing Restrictions Per Provider Order: No     Mobility Bed Mobility Overal bed mobility: Needs Assistance Bed  Mobility: Supine to Sit, Sit to Supine     Supine to sit: Supervision Sit to supine: Supervision        Transfers Overall transfer level: Needs assistance Equipment used: Rolling walker (2 wheels) Transfers: Sit to/from Stand Sit to Stand: Min assist           General transfer comment: cues for hand placement, assist to rise and stabilize      Balance Overall balance assessment: History of Falls, Mild deficits observed, not formally tested (recent fall)                                         ADL either performed or assessed with clinical judgement   ADL Overall ADL's : Needs assistance/impaired Eating/Feeding: Set up;Sitting   Grooming: Wash/dry hands;Wash/dry face;Oral care;Sitting;Contact guard assist;Cueing for sequencing   Upper Body Bathing: Contact guard assist;Sitting   Lower Body Bathing: Moderate assistance;Sit to/from stand;Cueing for sequencing;Cueing for safety   Upper Body Dressing : Contact guard assist;Sitting   Lower Body Dressing: Maximal assistance;Sit to/from stand;Cueing for safety;Cueing for sequencing   Toilet Transfer: Minimal assistance;Rolling walker (2 wheels);BSC/3in1   Toileting- Clothing Manipulation and Hygiene: Minimal assistance;Sitting/lateral lean Toileting - Clothing Manipulation Details (indicate cue type and reason): usign [place] as well     Functional mobility during ADLs: Minimal assistance;Rolling walker (2 wheels);Cueing for safety;Cueing for sequencing General ADL Comments: overall poor activity tolerance and high falls risk     Vision Baseline Vision/History: 1 Wears glasses;0 No visual deficits Ability to See in Adequate Light: 0 Adequate Patient Visual Report: No change from baseline Vision Assessment?: No apparent visual deficits            Pertinent Vitals/Pain  Pain Assessment Pain Assessment: No/denies pain Breathing: normal     Extremity/Trunk Assessment Upper Extremity  Assessment Upper Extremity Assessment: Generalized weakness;Right hand dominant   Lower Extremity Assessment Lower Extremity Assessment: Generalized weakness   Cervical / Trunk Assessment Cervical / Trunk Assessment: Normal   Communication Communication Communication: No apparent difficulties   Cognition Arousal: Alert Behavior During Therapy: WFL for tasks assessed/performed Cognition: History of cognitive impairments             OT - Cognition Comments: disoriented to place, time, needs support for safety, sequencing, STM and insight and judgement deficits                 Following commands: Intact       Cueing  General Comments   Cueing Techniques: Verbal cues  no SOB or skin issues noted           Home Living Family/patient expects to be discharged to:: Assisted living                             Home Equipment: Rolling Walker (2 wheels);Rollator (4 wheels)          Prior Functioning/Environment Prior Level of Function : Independent/Modified Independent             Mobility Comments: previously mod I with walker per pt however since her fall out of bed she has needed more assist from staff especially the past week per her report ADLs Comments: occasional assist for bathing and dressing    OT Problem List: Decreased strength;Decreased activity tolerance;Impaired balance (sitting and/or standing);Decreased cognition;Decreased safety awareness;Decreased knowledge of use of DME or AE;Decreased knowledge of precautions   OT Treatment/Interventions: Self-care/ADL training;Therapeutic exercise;Neuromuscular education;Energy conservation;DME and/or AE instruction;Therapeutic activities;Cognitive remediation/compensation;Patient/family education;Balance training      OT Goals(Current goals can be found in the care plan section)   Acute Rehab OT Goals Patient Stated Goal: to go home soon OT Goal Formulation: With patient Time For  Goal Achievement: 07/14/24 Potential to Achieve Goals: Fair ADL Goals Pt Will Perform Lower Body Bathing: with supervision;sit to/from stand Pt Will Perform Lower Body Dressing: with contact guard assist;sit to/from stand Pt Will Transfer to Toilet: with contact guard assist;grab bars;regular height toilet;ambulating Pt Will Perform Toileting - Clothing Manipulation and hygiene: sitting/lateral leans;with min assist Pt/caregiver will Perform Home Exercise Program: Both right and left upper extremity;With written HEP provided;Independently   OT Frequency:  Min 2X/week       AM-PAC OT 6 Clicks Daily Activity     Outcome Measure Help from another person eating meals?: A Little Help from another person taking care of personal grooming?: A Little Help from another person toileting, which includes using toliet, bedpan, or urinal?: A Lot Help from another person bathing (including washing, rinsing, drying)?: A Lot Help from another person to put on and taking off regular upper body clothing?: A Little Help from another person to put on and taking off regular lower body clothing?: A Lot 6 Click Score: 15   End of Session Equipment Utilized During Treatment: Gait belt;Rolling walker (2 wheels) Nurse Communication: Mobility status  Activity Tolerance: Patient limited by fatigue Patient left: in bed;with bed alarm set;with call bell/phone within reach  OT Visit Diagnosis: Unsteadiness on feet (R26.81);Muscle weakness (generalized) (M62.81);History of falling (Z91.81);Cognitive communication deficit (R41.841) Symptoms and signs involving cognitive functions: Other cerebrovascular disease  Time: 1500-1530 OT Time Calculation (min): 30 min Charges:  OT General Charges $OT Visit: 1 Visit OT Evaluation $OT Eval Low Complexity: 1 Low OT Treatments $Self Care/Home Management : 8-22 mins  Tanis Burnley OT/L Acute Rehabilitation Department  [phone]  06/30/2024, 5:12 PM

## 2024-06-30 NOTE — TOC Initial Note (Addendum)
 Transition of Care [facility]) - Initial/Assessment Note    Patient Details  Name: Sara Davila MRN: [id number] Date of Birth: [birth year]  [facility] CM/SW Contact:    Doneta Glenys DASEN, RN Phone Number: 06/30/2024, 12:20 PM  Clinical Narrative:                 Patient present for weakness. Patient states prior to admission lived at [facility]; DME-walker; Denies HH,oxygen and SDOH needs; Patient states she has a [facility] POA Tanda Fridge [phone]. Patients she will need to return to the [facility] at [facility] because she feels like she is dying. Patient consented to having palliative consult.  CM spoke with Porter from [facility] [phone]. [phone]). Weekend on call [phone] if needed. CM will follow 5:30 PM PASRR FL2 noted  Mayme GLENWOOD Barrows ID [id number]   Expected Discharge Plan: Skilled Nursing Facility Barriers to Discharge: Continued Medical Work up, Insurance Authorization   Patient Goals and CMS Choice Patient states their goals for this hospitalization and ongoing recovery are:: [facility] CMS Medicare.gov Compare Post Acute Care list provided to::  (NA) Choice offered to / list presented to : NA [place] ownership interest in [facility].provided to:: Parent NA    Expected Discharge Plan and Services In-house Referral: NA Discharge Planning Services: CM Consult   Living arrangements for the past 2 months: Skilled Nursing Facility                 DME Arranged: N/A DME Agency: NA       HH Arranged: NA HH Agency: NA        Prior Living Arrangements/Services Living arrangements for the past 2 months: Skilled Nursing Facility Lives with:: Self Patient language and need for interpreter reviewed:: Yes Do you feel safe going back to the place where you live?: Yes      Need for Family Participation in Patient Care: Yes (Comment) Care giver support system in place?: Yes (comment) Current home  services: DME (walker) Criminal Activity/Legal Involvement Pertinent to Current Situation/Hospitalization: No - Comment as needed  Activities of Daily Living   ADL Screening (condition at time of admission) Independently performs ADLs?: No Does the patient have a NEW difficulty with bathing/dressing/toileting/self-feeding that is expected to last >3 days?: Yes (Initiates electronic notice to provider for possible OT consult) Does the patient have a NEW difficulty with getting in/out of bed, walking, or climbing stairs that is expected to last >3 days?: Yes (Initiates electronic notice to provider for possible PT consult) Does the patient have a NEW difficulty with communication that is expected to last >3 days?: No Is the patient deaf or have difficulty hearing?: No Does the patient have difficulty seeing, even when wearing glasses/contacts?: No Does the patient have difficulty concentrating, remembering, or making decisions?: Yes  Permission Sought/Granted Permission sought to share information with : Case Manager Permission granted to share information with : Yes, Verbal Permission Granted  Share Information with NAME: Renay Humphrey [phone],  Permission granted to share info w AGENCY: [facility]        Emotional Assessment Appearance:: Appears stated age Attitude/Demeanor/Rapport: Engaged Affect (typically observed): Appropriate Orientation: : Oriented to Self, Oriented to Place, Oriented to  Time, Oriented to Situation Alcohol / Substance Use: Not Applicable Psych Involvement: No (comment)  Admission diagnosis:  Confusion [R41.0] SIRS (systemic inflammatory response syndrome) (HCC) [R65.10] Severe sepsis (HCC) [A41.9, R65.20] Patient Active Problem List   Diagnosis  Date Noted   SIRS (systemic inflammatory response syndrome) (HCC) 06/29/2024   Cough 06/20/2024   Physical deconditioning 06/05/2024   Frailty syndrome in geriatric patient 05/10/2024   Influenza A 01/07/2024    Edema, peripheral 07/13/2023   Urinary frequency 03/17/2023   Seborrheic dermatitis of scalp 12/08/2022   COVID-19 virus infection 07/11/2021   Urinary tract infection 04/25/2021   Hyperkalemia 04/02/2021   Allergic conjunctivitis 03/21/2021   Osteoarthritis, multiple sites 03/03/2021   Hyperlipidemia 01/08/2021   Restless leg syndrome 11/27/2020   S/P left THA, AA 10/31/2020   Seizures (HCC) 07/12/2020   Left-sided weakness 07/11/2020   History of TIA (transient ischemic attack) 07/11/2020   TIA (transient ischemic attack) 06/20/2020   Cerebrovascular accident (CVA) (HCC) 06/19/2020   Asymptomatic bacteriuria 06/19/2020   HTN (hypertension), benign 06/19/2020   Thrombocytosis 06/19/2020   Left hip pain 04/15/2020   Left knee pain 03/20/2020   Slow transit constipation 03/20/2020   Pain of toe of left foot 03/18/2020   Urinary retention 03/06/2020   Blood loss anemia 03/06/2020   History of fracture of left hip 03/03/2020   Prediabetes 01/04/2020   Memory deficit 01/04/2020   Osteoporosis 01/04/2020   History of vasculitis 01/04/2020   Venous insufficiency 01/04/2020   Neuropathy 03/30/2018   Hyponatremia 07/21/2017   Insomnia secondary to anxiety 07/21/2017   GERD (gastroesophageal reflux disease) 07/21/2017   S/P small bowel resection 07/09/2017   Hypothyroidism 02/26/2017   Paresthesias 09/06/2016   Fall with injury 03/29/2012   PCP:  Sherlynn Madden, MD Pharmacy:   [facility] - [place], [place] - [address] [place] [address] Phone: [phone] Fax: [phone]     Social Drivers of Health (SDOH) Social History: SDOH Screenings   Food Insecurity: No Food Insecurity (06/29/2024)  Housing: Low Risk  (06/29/2024)  Transportation Needs: No Transportation Needs (06/29/2024)  Utilities: Not At Risk (06/29/2024)  Depression (PHQ2-9): Low Risk  (12/23/2023)  Social Connections: Unknown (06/29/2024)   Tobacco Use: Medium Risk (06/29/2024)   SDOH Interventions:     Readmission Risk Interventions    06/30/2024   12:15 PM  Readmission Risk Prevention Plan  Post Dischage Appt Complete  Medication Screening Complete  Transportation Screening Complete

## 2024-07-01 DIAGNOSIS — R6510 Systemic inflammatory response syndrome (SIRS) of non-infectious origin without acute organ dysfunction: Secondary | ICD-10-CM | POA: Diagnosis not present

## 2024-07-01 MED ORDER — AZITHROMYCIN 250 MG PO TABS
500.0000 mg | ORAL_TABLET | Freq: Every day | ORAL | Status: DC
  Administered 2024-07-01: 500 mg via ORAL
  Filled 2024-07-01 (×2): qty 2

## 2024-07-01 MED ORDER — MELATONIN 5 MG PO TABS: 5.0000 mg | ORAL_TABLET | Freq: Every day | ORAL | Status: DC

## 2024-07-01 MED ORDER — POLYETHYLENE GLYCOL 3350 17 G PO PACK
17.0000 g | PACK | Freq: Every evening | ORAL | Status: DC
  Filled 2024-07-01: qty 1

## 2024-07-01 MED ORDER — DOCUSATE SODIUM 100 MG PO CAPS
100.0000 mg | ORAL_CAPSULE | Freq: Two times a day (BID) | ORAL | Status: DC
  Administered 2024-07-01: 100 mg via ORAL
  Filled 2024-07-01: qty 1

## 2024-07-01 MED ORDER — LORAZEPAM 0.5 MG PO TABS: 0.5000 mg | ORAL_TABLET | Freq: Three times a day (TID) | ORAL | Status: DC | PRN

## 2024-07-01 MED ORDER — GABAPENTIN 100 MG PO CAPS
100.0000 mg | ORAL_CAPSULE | Freq: Every day | ORAL | Status: DC
  Administered 2024-07-01: 100 mg via ORAL
  Filled 2024-07-01: qty 1

## 2024-07-01 MED ORDER — DOCUSATE SODIUM 100 MG PO CAPS
200.0000 mg | ORAL_CAPSULE | Freq: Every day | ORAL | Status: DC
  Administered 2024-07-01: 200 mg via ORAL
  Filled 2024-07-01 (×2): qty 2

## 2024-07-01 MED ORDER — MELATONIN 5 MG PO CHEW: 5.0000 mg | CHEWABLE_TABLET | Freq: Every day | ORAL | Status: DC

## 2024-07-01 NOTE — Progress Notes (Signed)
 PROGRESS NOTE    Sara Davila  FMW:[id number] DOB: 01/28/1930 DOA: 06/29/2024 PCP: Sherlynn Madden, MD   Brief Narrative: Sara Davila is a 89 y.o. female with a history of hypertension, hypothyroidism, hyponatremia, seizures.  Patient presented secondary to dyspnea and increased work of breathing concerning for worsening pneumonia. Patient started empirically on antibiotics. Patient declines further workup.   Assessment and Plan:  Sepsis Present on admission, secondary to pneumonia. Blood cultures obtained and antibiotics started.  Community acquired pneumonia Presumed diagnosis as she was being treated for this as an outpatient, although chest x-ray is unremarkable for acute illness. Patient with fever, tachypnea and dyspnea. RVP negative. Procalcitonin undetectable. Patient declining further workup, but agreeable to antibiotics. -Continue Ceftriaxone  and azithromycin   Hypothyroidism -Continue Synthroid   Hypokalemia Potassium supplementation given.  Chronic hyponatremia -Continue sodium tablets and Lasix   Seizure disorder -Continue Keppra   Dysuria Patient is concerned about a UTI and requests evaluation of her symptoms. -Check urine culture  Goals of care Patient reports readiness to die and and readiness to meet Jesus. She declines further workup this admission and requests to return to her facility. Patient confirms DNR and DNI.   DVT prophylaxis: Lovenox  Code Status:   Code Status: Limited: Do not attempt resuscitation (DNR) -DNR-LIMITED -Do Not Intubate/DNI  Family Communication: None at bedside. Disposition Plan: Discharge back to facility once able (bed availability, insurance authorization), per TOC.   Consultants:  None  Procedures:  None  Antimicrobials: Ceftriaxone  Azithromycin     Subjective: Patient reports dysuria. No other concerns.  Objective: BP (!) 154/137 (BP Location: Right Arm)   Pulse 84   Temp 98.4 F (36.9 C) (Oral)    Resp 18   Ht 4' 9 (1.448 m)   Wt 48.1 kg   SpO2 96%   BMI 22.95 kg/m   Examination:  General exam: Appears calm and comfortable Respiratory system: Respiratory effort normal. Gastrointestinal system: Abdomen is nondistended, soft and nontender. Normal bowel sounds heard. Central nervous system: Alert and oriented. Psychiatry: Judgement and insight appear normal. Mood & affect appropriate.    Data Reviewed: I have personally reviewed following labs and imaging studies  CBC Lab Results  Component Value Date   WBC 8.2 06/30/2024   RBC 3.61 (L) 06/30/2024   HGB 11.7 (L) 06/30/2024   HCT 35.5 (L) 06/30/2024   MCV 98.3 06/30/2024   MCH 32.4 06/30/2024   PLT 342 06/30/2024   MCHC 33.0 06/30/2024   RDW 12.3 06/30/2024   LYMPHSABS 0.5 (L) 06/29/2024   MONOABS 0.3 06/29/2024   EOSABS 0.0 06/29/2024   BASOSABS 0.1 06/29/2024     Last metabolic panel Lab Results  Component Value Date   NA 127 (L) 06/30/2024   K 3.2 (L) 06/30/2024   CL 100 06/30/2024   CO2 24 06/30/2024   BUN 7 (L) 06/30/2024   CREATININE 0.45 06/30/2024   GLUCOSE 97 06/30/2024   GFRNONAA >60 06/30/2024   GFRAA >60 07/14/2020   CALCIUM  8.6 (L) 06/30/2024   PROT 8.0 06/29/2024   ALBUMIN  4.3 06/29/2024   LABGLOB 3.1 03/30/2018   BILITOT 0.9 06/29/2024   ALKPHOS 61 06/29/2024   AST 25 06/29/2024   ALT 14 06/29/2024   ANIONGAP 3 (L) 06/30/2024    GFR: Estimated Creatinine Clearance: 30.7 mL/min (by C-G formula based on SCr of 0.45 mg/dL).  Recent Results (from the past 240 hours)  Blood Culture (routine x 2)     Status: None (Preliminary result)   Collection Time: 06/29/24 10:37 AM  Specimen: BLOOD  Result Value Ref Range Status   Specimen Description   Final    BLOOD RIGHT ANTECUBITAL Performed at [facility], [address] [place], [place] [address]    Special Requests   Final    BOTTLES DRAWN AEROBIC AND ANAEROBIC Blood Culture results may not be optimal due to an  inadequate volume of blood received in culture bottles Performed at [facility], [address], [place], [place] [address]    Culture   Final    NO GROWTH 2 DAYS Performed at [facility], [address] [place], [place] [address]    Report Status PENDING  Incomplete  Blood Culture (routine x 2)     Status: None (Preliminary result)   Collection Time: 06/29/24 10:50 AM   Specimen: BLOOD  Result Value Ref Range Status   Specimen Description   Final    BLOOD BLOOD LEFT HAND Performed at [facility], [address], [place], [place] [address]    Special Requests   Final    BOTTLES DRAWN AEROBIC AND ANAEROBIC Blood Culture results may not be optimal due to an inadequate volume of blood received in culture bottles Performed at [facility], [address], [place], [place] [address]    Culture   Final    NO GROWTH 2 DAYS Performed at [facility], [address] [place], [place] [address]    Report Status PENDING  Incomplete  Resp panel by RT-PCR (RSV, Flu A&B, Covid) Urine, Catheterized     Status: None   Collection Time: 06/29/24 11:36 AM   Specimen: Urine, Catheterized; Nasal Swab  Result Value Ref Range Status   SARS Coronavirus 2 by RT PCR NEGATIVE NEGATIVE Final    Comment: (NOTE) SARS-CoV-2 target nucleic acids are NOT DETECTED.  The SARS-CoV-2 RNA is generally detectable in upper respiratory specimens during the acute phase of infection. The lowest concentration of SARS-CoV-2 viral copies this assay can detect is 138 copies/mL. A negative result does not preclude SARS-Cov-2 infection and should not be used as the sole basis for treatment or other patient management decisions. A negative result may occur with  improper specimen collection/handling, submission of specimen other than nasopharyngeal swab, presence of viral mutation(s) within the areas targeted by this assay, and inadequate number of  viral copies(<138 copies/mL). A negative result must be combined with clinical observations, patient history, and epidemiological information. The expected result is Negative.  Fact Sheet for Patients:  BloggerCourse.com  Fact Sheet for Healthcare Providers:  SeriousBroker.it  This test is no t yet approved or cleared by the [place]  FDA and  has been authorized for detection and/or diagnosis of SARS-CoV-2 by FDA under an Emergency Use Authorization (EUA). This EUA will remain  in effect (meaning this test can be used) for the duration of the COVID-19 declaration under Section 564(b)(1) of the Act, 21 U.S.C.section 360bbb-3(b)(1), unless the authorization is terminated  or revoked sooner.       Influenza A by PCR NEGATIVE NEGATIVE Final   Influenza B by PCR NEGATIVE NEGATIVE Final    Comment: (NOTE) The Xpert Xpress SARS-CoV-2/FLU/RSV plus assay is intended as an aid in the diagnosis of influenza from Nasopharyngeal swab specimens and should not be used as a sole basis for treatment. Nasal washings and aspirates are unacceptable for Xpert Xpress SARS-CoV-2/FLU/RSV testing.  Fact Sheet for Patients: BloggerCourse.com  Fact Sheet for Healthcare Providers: SeriousBroker.it  This test is  not yet approved or cleared by the [place]  FDA and has been authorized for detection and/or diagnosis of SARS-CoV-2 by FDA under an Emergency Use Authorization (EUA). This EUA will remain in effect (meaning this test can be used) for the duration of the COVID-19 declaration under Section 564(b)(1) of the Act, 21 U.S.C. section 360bbb-3(b)(1), unless the authorization is terminated or revoked.     Resp Syncytial Virus by PCR NEGATIVE NEGATIVE Final    Comment: (NOTE) Fact Sheet for Patients: BloggerCourse.com  Fact Sheet for Healthcare  Providers: SeriousBroker.it  This test is not yet approved or cleared by the [place]  FDA and has been authorized for detection and/or diagnosis of SARS-CoV-2 by FDA under an Emergency Use Authorization (EUA). This EUA will remain in effect (meaning this test can be used) for the duration of the COVID-19 declaration under Section 564(b)(1) of the Act, 21 U.S.C. section 360bbb-3(b)(1), unless the authorization is terminated or revoked.  Performed at [facility], [address], [place], [place] [address]   Respiratory (~20 pathogens) panel by PCR     Status: None   Collection Time: 06/29/24 11:36 AM   Specimen: Nasopharyngeal Swab; Respiratory  Result Value Ref Range Status   Adenovirus NOT DETECTED NOT DETECTED Final   Coronavirus 229E NOT DETECTED NOT DETECTED Final    Comment: (NOTE) The Coronavirus on the Respiratory Panel, DOES NOT test for the novel  Coronavirus (2019 nCoV)    Coronavirus HKU1 NOT DETECTED NOT DETECTED Final   Coronavirus NL63 NOT DETECTED NOT DETECTED Final   Coronavirus OC43 NOT DETECTED NOT DETECTED Final   Metapneumovirus NOT DETECTED NOT DETECTED Final   Rhinovirus / Enterovirus NOT DETECTED NOT DETECTED Final   Influenza A NOT DETECTED NOT DETECTED Final   Influenza B NOT DETECTED NOT DETECTED Final   Parainfluenza Virus 1 NOT DETECTED NOT DETECTED Final   Parainfluenza Virus 2 NOT DETECTED NOT DETECTED Final   Parainfluenza Virus 3 NOT DETECTED NOT DETECTED Final   Parainfluenza Virus 4 NOT DETECTED NOT DETECTED Final   Respiratory Syncytial Virus NOT DETECTED NOT DETECTED Final   Bordetella pertussis NOT DETECTED NOT DETECTED Final   Bordetella Parapertussis NOT DETECTED NOT DETECTED Final   Chlamydophila pneumoniae NOT DETECTED NOT DETECTED Final   Mycoplasma pneumoniae NOT DETECTED NOT DETECTED Final    Comment: Performed at [facility], [address] [place], [place] [address]       Radiology Studies: DG Pelvis 1-2 Views Result Date: 06/29/2024 CLINICAL DATA:  Status post fall EXAM: PELVIS - 1 VIEW COMPARISON:  Pelvis radiograph dated 06/02/2024 FINDINGS: Left hip arthroplasty hardware appears intact. Partially imaged lumbar spinal fusion hardware appears intact. There is no evidence of pelvic fracture or diastasis. No pelvic bone lesions are seen. Degenerative changes of the right hip. IMPRESSION: 1. No acute fracture or dislocation. 2. Left hip arthroplasty hardware appears intact. Electronically Signed   By: Limin  Xu M.D.   On: 06/29/2024 13:12   DG Chest Port 1 View Result Date: 06/29/2024 CLINICAL DATA:  Questionable sepsis - evaluate for abnormality. EXAM: PORTABLE CHEST 1 VIEW COMPARISON:  06/02/2024. FINDINGS: Bilateral lung fields are clear. Bilateral costophrenic angles are clear. Stable cardio-mediastinal silhouette. No acute osseous abnormalities. The soft tissues are within normal limits. IMPRESSION: No active disease. Electronically Signed   By: Ree Molt M.D.   On: 06/29/2024 10:56      LOS: 2 days    Elgin Lam, MD [facility] 07/01/2024, 10:43 AM  If 7PM-7AM, please contact night-coverage [email or web address]

## 2024-07-01 NOTE — Plan of Care (Signed)

## 2024-07-01 NOTE — Progress Notes (Signed)
 PHARMACIST - PHYSICIAN COMMUNICATION DR:   Briana CONCERNING: Antibiotic IV to Oral Route Change Policy  RECOMMENDATION: This patient is receiving azithromycin  by the intravenous route.  Based on criteria approved by the Pharmacy and Therapeutics Committee, the antibiotic(s) is/are being converted to the equivalent oral dose form(s).   DESCRIPTION: These criteria include: Patient being treated for a respiratory tract infection, urinary tract infection, cellulitis or clostridium difficile associated diarrhea if on metronidazole  The patient is not neutropenic and does not exhibit a GI malabsorption state The patient is eating (either orally or via tube) and/or has been taking other orally administered medications for a least 24 hours The patient is improving clinically and has a Tmax < 100.5  If you have questions about this conversion, please contact the Pharmacy Department  []   [phone] )  [facility] []   [phone] )  [facility]  []   [phone] )  [facility] [x]   [phone] )  [facility]    Thank you for allowing pharmacy to be a part of this patient's care.  Eleanor EMERSON Agent, PharmD, BCPS Clinical Pharmacist [place] 07/01/2024 11:58 AM

## 2024-07-02 DIAGNOSIS — A419 Sepsis, unspecified organism: Secondary | ICD-10-CM | POA: Diagnosis not present

## 2024-07-02 DIAGNOSIS — J189 Pneumonia, unspecified organism: Secondary | ICD-10-CM | POA: Insufficient documentation

## 2024-07-02 LAB — URINE CULTURE: Culture: NO GROWTH

## 2024-07-02 MED ORDER — AZITHROMYCIN 500 MG PO TABS: 500.0000 mg | ORAL_TABLET | Freq: Every day | ORAL | Status: AC

## 2024-07-02 MED ORDER — MELOXICAM 7.5 MG PO TABS
7.5000 mg | ORAL_TABLET | Freq: Every day | ORAL | Status: AC
Start: 2024-07-02 — End: ?

## 2024-07-02 MED ORDER — CEFDINIR 300 MG PO CAPS: 300.0000 mg | ORAL_CAPSULE | Freq: Every day | ORAL | Status: AC

## 2024-07-02 NOTE — Discharge Summary (Signed)
 Physician Discharge Summary   Patient: Sara Davila MRN: [id number] DOB: 09/21/1930  Admit date:     06/29/2024  Discharge date: 07/02/24  Discharge Physician: Elgin Lam, MD   PCP: Sherlynn Madden, MD   Recommendations at discharge:  Palliative care medicine referral Urine culture pending on discharge  Discharge Diagnoses: Principal Problem:   Sepsis [facility]) Active Problems:   Hypothyroidism   Hyponatremia   Seizures (HCC)   CAP (community acquired pneumonia)  Resolved Problems:   * No resolved hospital problems. *  Hospital Course: Sara Davila is a 89 y.o. female with a history of hypertension, hypothyroidism, hyponatremia, seizures.  Patient presented secondary to dyspnea and increased work of breathing concerning for worsening pneumonia. Patient started empirically on antibiotics. Patient declines further workup.  Assessment and Plan:  Sepsis Present on admission, secondary to pneumonia. Blood cultures obtained and antibiotics started.   Community acquired pneumonia Presumed diagnosis as she was being treated for this as an outpatient, although chest x-ray is unremarkable for acute illness. Patient with fever, tachypnea and dyspnea. RVP negative. Procalcitonin undetectable. Patient declining further workup, but agreeable to antibiotics. Transition to cefdinir  and azithromycin  on discharge.   Hypothyroidism Continue Synthroid    Hypokalemia Potassium supplementation given.   Chronic hyponatremia Continue sodium tablets and Lasix    Seizure disorder Continue Keppra    Dysuria Patient is concerned about a UTI and requests evaluation of her symptoms.   Goals of care Patient reports readiness to die and and readiness to meet Jesus. She declines further workup this admission and requests to return to her facility. Patient confirms DNR and DNI.   Consultants: None Procedures performed: None  Disposition: Skilled nursing facility Diet recommendation:  Regular diet   DISCHARGE MEDICATION: Allergies as of 07/02/2024       Reactions   Codeine Rash   Penicillins Rash   Has patient had a PCN reaction causing immediate rash, facial/tongue/throat swelling, SOB or lightheadedness with hypotension: No Has patient had a PCN reaction causing severe rash involving mucus membranes or skin necrosis: No Has patient had a PCN reaction that required hospitalization: No Has patient had a PCN reaction occurring within the last 10 years: No If all of the above answers are NO, then may proceed with Cephalosporin use. Tolerated Cephalosporin 10/31/20   Levaquin [levofloxacin] Other (See Comments)   Allergic, per MAR   Tuberculin Tests Other (See Comments)   Allergic, per MAR   Tuberculin, Ppd Other (See Comments)   Allergic, per [facility]        Medication List     STOP taking these medications    ibuprofen  200 MG tablet Commonly known as: ADVIL        TAKE these medications    acetaminophen  500 MG tablet Commonly known as: TYLENOL  Take 1,000 mg by mouth 2 (two) times daily as needed (for pain).   aspirin  EC 81 MG tablet Take 81 mg by mouth at bedtime. Swallow whole.   azithromycin  500 MG tablet Commonly known as: Zithromax  Take 1 tablet (500 mg total) by mouth daily for 2 days.   cefdinir  300 MG capsule Commonly known as: OMNICEF  Take 1 capsule (300 mg total) by mouth daily for 2 days.   cyanocobalamin 1000 MCG tablet Commonly known as: VITAMIN B12 Take 1,000 mcg by mouth daily.   furosemide  20 MG tablet Commonly known as: LASIX  Take 20 mg by mouth every other day.   gabapentin  100 MG capsule Commonly known as: NEURONTIN  Take 1 capsule (100 mg total) by  mouth at bedtime.   levETIRAcetam  500 MG 24 hr tablet Commonly known as: Keppra  XR Take 1 tablet (500 mg total) by mouth daily. What changed: when to take this   levothyroxine  75 MCG tablet Commonly known as: SYNTHROID  Take 1 tablet (75 mcg total) by mouth daily  before breakfast.   LORazepam  0.5 MG tablet Commonly known as: ATIVAN  Take 0.5 mg by mouth every 8 (eight) hours as needed for anxiety.   Melatonin 5 MG Chew Chew 5 mg by mouth at bedtime.   meloxicam  7.5 MG tablet Commonly known as: MOBIC  Take 1 tablet (7.5 mg total) by mouth daily. What changed:  when to take this reasons to take this   Multivitamin Tabs Take 1 tablet by mouth daily with breakfast.   mupirocin  ointment 2 % Commonly known as: BACTROBAN  Apply 1 Application topically as needed (for irritation- affected areas as directed). Apply to irritated area topically as needed for irritation.   omeprazole 20 MG capsule Commonly known as: PRILOSEC Take 20 mg by mouth daily as needed (for indigestion/upset stomach).   OXYGEN Inhale 2 L/min into the lungs as needed (TO KEEP SATS AT >90%).   Oyster Shell 500 MG Tabs Take 500 mg by mouth daily.   Pataday 0.7 % Soln Generic drug: Olopatadine HCl Place 1 drop into both eyes as needed (for dryness).   polyethylene glycol 17 g packet Commonly known as: MIRALAX  / GLYCOLAX  Take 17 g by mouth daily as needed for mild constipation.   senna 8.6 MG tablet Commonly known as: SENOKOT Take 1 tablet by mouth daily.   sennosides-docusate sodium  8.6-50 MG tablet Commonly known as: SENOKOT-S Take 1-2 tablets by mouth daily as needed for constipation.   sodium chloride  1 g tablet Take 1 tablet (1 g total) by mouth every other day.   sodium fluoride  1.1 % Gel dental gel Commonly known as: FLUORISHIELD Place 1 Application onto teeth in the morning.        Discharge Exam: BP 135/64 (BP Location: Right Arm)   Pulse 92   Temp 98.2 F (36.8 C) (Oral)   Resp 18   Ht 4' 9 (1.448 m)   Wt 48.1 kg   SpO2 98%   BMI 22.95 kg/m   General exam: Appears calm and comfortable Respiratory system: Respiratory effort normal.  Condition at discharge: stable  The results of significant diagnostics from this hospitalization  (including imaging, microbiology, ancillary and laboratory) are listed below for reference.   Imaging Studies: DG Pelvis 1-2 Views Result Date: 06/29/2024 CLINICAL DATA:  Status post fall EXAM: PELVIS - 1 VIEW COMPARISON:  Pelvis radiograph dated 06/02/2024 FINDINGS: Left hip arthroplasty hardware appears intact. Partially imaged lumbar spinal fusion hardware appears intact. There is no evidence of pelvic fracture or diastasis. No pelvic bone lesions are seen. Degenerative changes of the right hip. IMPRESSION: 1. No acute fracture or dislocation. 2. Left hip arthroplasty hardware appears intact. Electronically Signed   By: Limin  Xu M.D.   On: 06/29/2024 13:12   DG Chest Port 1 View Result Date: 06/29/2024 CLINICAL DATA:  Questionable sepsis - evaluate for abnormality. EXAM: PORTABLE CHEST 1 VIEW COMPARISON:  06/02/2024. FINDINGS: Bilateral lung fields are clear. Bilateral costophrenic angles are clear. Stable cardio-mediastinal silhouette. No acute osseous abnormalities. The soft tissues are within normal limits. IMPRESSION: No active disease. Electronically Signed   By: Ree Molt M.D.   On: 06/29/2024 10:56   DG Pelvis Portable Result Date: 06/02/2024 CLINICAL DATA:  Unwitnessed fall EXAM: PORTABLE  PELVIS 1-2 VIEWS COMPARISON:  10/31/2020 FINDINGS: Extensive hardware in the lumbar and upper sacral spine. Left hip replacement with normal alignment. Pubic symphysis and rami appear intact. SI joints are non widened. No acute fracture. Moderate advanced right femoroacetabular degenerative changes IMPRESSION: No acute osseous abnormality. Left hip replacement. Electronically Signed   By: Luke Bun M.D.   On: 06/02/2024 17:24   DG Chest Portable 1 View Result Date: 06/02/2024 CLINICAL DATA:  Fall EXAM: PORTABLE CHEST 1 VIEW COMPARISON:  07/18/2020 FINDINGS: Hardware in the cervical spine. Calcified hilar nodes and calcified lung granulomas. No acute pleural effusion or pneumothorax. Small vague right  lower lung opacity. Stable cardiomediastinal silhouette. IMPRESSION: Prior granulomatous disease. Small vague focus of opacity in the right lower lung indeterminate for infection or inflammation, suggest short interval two-view chest radiograph follow-up Electronically Signed   By: Luke Bun M.D.   On: 06/02/2024 17:23   DG Elbow 2 Views Left Result Date: 06/02/2024 CLINICAL DATA:  Fall with laceration EXAM: LEFT ELBOW - 2 VIEW COMPARISON:  None Available. FINDINGS: No definitive fracture or malalignment. No significant elbow effusion IMPRESSION: No acute osseous abnormality. Electronically Signed   By: Luke Bun M.D.   On: 06/02/2024 17:21   DG Wrist Complete Left Result Date: 06/02/2024 CLINICAL DATA:  Fall EXAM: LEFT WRIST - COMPLETE 3+ VIEW COMPARISON:  None Available. FINDINGS: Limited by habitus. Osteopenia. No gross fracture or dislocation. Severe arthritis at the first [facility] joint and STT interval. Advanced radiocarpal degenerative changes. Chondro calcinosis. IMPRESSION: 1. Limited by habitus. No gross acute osseous abnormality. 2. 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 Electronically Signed   By: Luke Bun M.D.   On: 06/02/2024 17:20   DG Shoulder Left Portable Result Date: 06/02/2024 CLINICAL DATA:  Mechanical fall EXAM: LEFT SHOULDER COMPARISON:  None Available. FINDINGS: No fracture or malalignment. Mild AC joint degenerative change. Mild calcific tendinopathy IMPRESSION: No acute osseous abnormality. Electronically Signed   By: Luke Bun M.D.   On: 06/02/2024 17:18   CT Head Wo Contrast Result Date: 06/02/2024 CLINICAL DATA:  Unwitnessed fall, fall on face, laceration EXAM: CT HEAD WITHOUT CONTRAST CT MAXILLOFACIAL WITHOUT CONTRAST CT CERVICAL SPINE WITHOUT CONTRAST TECHNIQUE: Multidetector CT imaging of the head, cervical spine, and maxillofacial structures were performed using the standard protocol without intravenous contrast. Multiplanar CT image reconstructions of the cervical spine  and maxillofacial structures were also generated. RADIATION DOSE REDUCTION: This exam was performed according to the departmental dose-optimization program which includes automated exposure control, adjustment of the mA and/or kV according to patient size and/or use of iterative reconstruction technique. COMPARISON:  07/11/2020 FINDINGS: CT HEAD FINDINGS Brain: No evidence of acute infarction, hemorrhage, hydrocephalus, extra-axial collection or mass lesion/mass effect. Periventricular white matter hypodensity. Vascular: No hyperdense vessel or unexpected calcification. CT FACIAL BONES FINDINGS Skull: Normal. Negative for fracture or focal lesion. Facial bones: No displaced fractures or dislocations. Sinuses/Orbits: No acute finding. Other: None. CT CERVICAL SPINE FINDINGS Alignment: Degenerative and postoperative straightening of the normal cervical lordosis. Degenerative anterolisthesis of C7 on T1 Skull base and vertebrae: No acute fracture. No primary bone lesion or focal pathologic process. Soft tissues and spinal canal: No prevertebral fluid or swelling. No visible canal hematoma. Disc levels: Anterior cervical discectomy and fusion of C4-C5 with ankylosis of C5-C6. Severe disc degenerative change of the remaining cervical levels. Upper chest: Negative. Other: None. IMPRESSION: 1. No acute intracranial pathology. 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 2. No displaced fractures or dislocations  of the facial bones. 3. No fracture or static subluxation of the cervical spine. 4. Anterior cervical discectomy and fusion of C4-C5 with ankylosis of C5-C6. Severe disc degenerative change of the remaining cervical levels. Electronically Signed   By: Marolyn JONETTA Jaksch M.D.   On: 06/02/2024 17:03   CT Cervical Spine Wo Contrast Result Date: 06/02/2024 CLINICAL DATA:  Unwitnessed fall, fall on face, laceration EXAM: CT HEAD WITHOUT CONTRAST CT MAXILLOFACIAL WITHOUT CONTRAST CT CERVICAL SPINE  WITHOUT CONTRAST TECHNIQUE: Multidetector CT imaging of the head, cervical spine, and maxillofacial structures were performed using the standard protocol without intravenous contrast. Multiplanar CT image reconstructions of the cervical spine and maxillofacial structures were also generated. RADIATION DOSE REDUCTION: This exam was performed according to the departmental dose-optimization program which includes automated exposure control, adjustment of the mA and/or kV according to patient size and/or use of iterative reconstruction technique. COMPARISON:  07/11/2020 FINDINGS: CT HEAD FINDINGS Brain: No evidence of acute infarction, hemorrhage, hydrocephalus, extra-axial collection or mass lesion/mass effect. Periventricular white matter hypodensity. Vascular: No hyperdense vessel or unexpected calcification. CT FACIAL BONES FINDINGS Skull: Normal. Negative for fracture or focal lesion. Facial bones: No displaced fractures or dislocations. Sinuses/Orbits: No acute finding. Other: None. CT CERVICAL SPINE FINDINGS Alignment: Degenerative and postoperative straightening of the normal cervical lordosis. Degenerative anterolisthesis of C7 on T1 Skull base and vertebrae: No acute fracture. No primary bone lesion or focal pathologic process. Soft tissues and spinal canal: No prevertebral fluid or swelling. No visible canal hematoma. Disc levels: Anterior cervical discectomy and fusion of C4-C5 with ankylosis of C5-C6. Severe disc degenerative change of the remaining cervical levels. Upper chest: Negative. Other: None. IMPRESSION: 1. No acute intracranial pathology. 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 2. No displaced fractures or dislocations of the facial bones. 3. No fracture or static subluxation of the cervical spine. 4. Anterior cervical discectomy and fusion of C4-C5 with ankylosis of C5-C6. Severe disc degenerative change of the remaining cervical levels. Electronically Signed   By:  Marolyn JONETTA Jaksch M.D.   On: 06/02/2024 17:03   CT Maxillofacial Wo Contrast Result Date: 06/02/2024 CLINICAL DATA:  Unwitnessed fall, fall on face, laceration EXAM: CT HEAD WITHOUT CONTRAST CT MAXILLOFACIAL WITHOUT CONTRAST CT CERVICAL SPINE WITHOUT CONTRAST TECHNIQUE: Multidetector CT imaging of the head, cervical spine, and maxillofacial structures were performed using the standard protocol without intravenous contrast. Multiplanar CT image reconstructions of the cervical spine and maxillofacial structures were also generated. RADIATION DOSE REDUCTION: This exam was performed according to the departmental dose-optimization program which includes automated exposure control, adjustment of the mA and/or kV according to patient size and/or use of iterative reconstruction technique. COMPARISON:  07/11/2020 FINDINGS: CT HEAD FINDINGS Brain: No evidence of acute infarction, hemorrhage, hydrocephalus, extra-axial collection or mass lesion/mass effect. Periventricular white matter hypodensity. Vascular: No hyperdense vessel or unexpected calcification. CT FACIAL BONES FINDINGS Skull: Normal. Negative for fracture or focal lesion. Facial bones: No displaced fractures or dislocations. Sinuses/Orbits: No acute finding. Other: None. CT CERVICAL SPINE FINDINGS Alignment: Degenerative and postoperative straightening of the normal cervical lordosis. Degenerative anterolisthesis of C7 on T1 Skull base and vertebrae: No acute fracture. No primary bone lesion or focal pathologic process. Soft tissues and spinal canal: No prevertebral fluid or swelling. No visible canal hematoma. Disc levels: Anterior cervical discectomy and fusion of C4-C5 with ankylosis of C5-C6. Severe disc degenerative change of the remaining cervical levels. Upper chest: Negative. Other: None. IMPRESSION: 1. No acute intracranial pathology. Small-vessel white matter  disease in keeping with advanced patient age. 2. No displaced fractures or dislocations of the  facial bones. 3. No fracture or static subluxation of the cervical spine. 4. Anterior cervical discectomy and fusion of C4-C5 with ankylosis of C5-C6. Severe disc degenerative change of the remaining cervical levels. Electronically Signed   By: Marolyn JONETTA Jaksch M.D.   On: 06/02/2024 17:03    Microbiology: Results for orders placed or performed during the hospital encounter of 06/29/24  Blood Culture (routine x 2)     Status: None (Preliminary result)   Collection Time: 06/29/24 10:37 AM   Specimen: BLOOD  Result Value Ref Range Status   Specimen Description   Final    BLOOD RIGHT ANTECUBITAL Performed at [facility], [address] [place], [place] [address]    Special Requests   Final    BOTTLES DRAWN AEROBIC AND ANAEROBIC Blood Culture results may not be optimal due to an inadequate volume of blood received in culture bottles Performed at [facility], [address] [place], [place] [address]    Culture   Final    NO GROWTH 2 DAYS Performed at [facility], [address] [place], [place] [address]    Report Status PENDING  Incomplete  Blood Culture (routine x 2)     Status: None (Preliminary result)   Collection Time: 06/29/24 10:50 AM   Specimen: BLOOD  Result Value Ref Range Status   Specimen Description   Final    BLOOD BLOOD LEFT HAND Performed at [facility], [address] [place], [place] [address]    Special Requests   Final    BOTTLES DRAWN AEROBIC AND ANAEROBIC Blood Culture results may not be optimal due to an inadequate volume of blood received in culture bottles Performed at [facility], [address], [place], [place] [address]    Culture   Final    NO GROWTH 2 DAYS Performed at [facility], [address], [place], [place] [address]    Report Status PENDING  Incomplete  Resp panel by RT-PCR (RSV, Flu A&B, Covid) Urine, Catheterized     Status: None   Collection  Time: 06/29/24 11:36 AM   Specimen: Urine, Catheterized; Nasal Swab  Result Value Ref Range Status   SARS Coronavirus 2 by RT PCR NEGATIVE NEGATIVE Final    Comment: (NOTE) SARS-CoV-2 target nucleic acids are NOT DETECTED.  The SARS-CoV-2 RNA is generally detectable in upper respiratory specimens during the acute phase of infection. The lowest concentration of SARS-CoV-2 viral copies this assay can detect is 138 copies/mL. A negative result does not preclude SARS-Cov-2 infection and should not be used as the sole basis for treatment or other patient management decisions. A negative result may occur with  improper specimen collection/handling, submission of specimen other than nasopharyngeal swab, presence of viral mutation(s) within the areas targeted by this assay, and inadequate number of viral copies(<138 copies/mL). A negative result must be combined with clinical observations, patient history, and epidemiological information. The expected result is Negative.  Fact Sheet for Patients:  BloggerCourse.com  Fact Sheet for Healthcare Providers:  SeriousBroker.it  This test is no t yet approved or cleared by the [place]  FDA and  has been authorized for detection and/or diagnosis of SARS-CoV-2 by FDA under an Emergency Use Authorization (EUA). This EUA will remain  in effect (meaning this test can be used) for the duration of the COVID-19 declaration under Section 564(b)(1)  of the Act, 21 U.S.C.section 360bbb-3(b)(1), unless the authorization is terminated  or revoked sooner.       Influenza A by PCR NEGATIVE NEGATIVE Final   Influenza B by PCR NEGATIVE NEGATIVE Final    Comment: (NOTE) The Xpert Xpress SARS-CoV-2/FLU/RSV plus assay is intended as an aid in the diagnosis of influenza from Nasopharyngeal swab specimens and should not be used as a sole basis for treatment. Nasal washings and aspirates are unacceptable for  Xpert Xpress SARS-CoV-2/FLU/RSV testing.  Fact Sheet for Patients: BloggerCourse.com  Fact Sheet for Healthcare Providers: SeriousBroker.it  This test is not yet approved or cleared by the [place]  FDA and has been authorized for detection and/or diagnosis of SARS-CoV-2 by FDA under an Emergency Use Authorization (EUA). This EUA will remain in effect (meaning this test can be used) for the duration of the COVID-19 declaration under Section 564(b)(1) of the Act, 21 U.S.C. section 360bbb-3(b)(1), unless the authorization is terminated or revoked.     Resp Syncytial Virus by PCR NEGATIVE NEGATIVE Final    Comment: (NOTE) Fact Sheet for Patients: BloggerCourse.com  Fact Sheet for Healthcare Providers: SeriousBroker.it  This test is not yet approved or cleared by the [place]  FDA and has been authorized for detection and/or diagnosis of SARS-CoV-2 by FDA under an Emergency Use Authorization (EUA). This EUA will remain in effect (meaning this test can be used) for the duration of the COVID-19 declaration under Section 564(b)(1) of the Act, 21 U.S.C. section 360bbb-3(b)(1), unless the authorization is terminated or revoked.  Performed at [facility], [address], [place], [place] [address]   Respiratory (~20 pathogens) panel by PCR     Status: None   Collection Time: 06/29/24 11:36 AM   Specimen: Nasopharyngeal Swab; Respiratory  Result Value Ref Range Status   Adenovirus NOT DETECTED NOT DETECTED Final   Coronavirus 229E NOT DETECTED NOT DETECTED Final    Comment: (NOTE) The Coronavirus on the Respiratory Panel, DOES NOT test for the novel  Coronavirus (2019 nCoV)    Coronavirus HKU1 NOT DETECTED NOT DETECTED Final   Coronavirus NL63 NOT DETECTED NOT DETECTED Final   Coronavirus OC43 NOT DETECTED NOT DETECTED Final   Metapneumovirus NOT  DETECTED NOT DETECTED Final   Rhinovirus / Enterovirus NOT DETECTED NOT DETECTED Final   Influenza A NOT DETECTED NOT DETECTED Final   Influenza B NOT DETECTED NOT DETECTED Final   Parainfluenza Virus 1 NOT DETECTED NOT DETECTED Final   Parainfluenza Virus 2 NOT DETECTED NOT DETECTED Final   Parainfluenza Virus 3 NOT DETECTED NOT DETECTED Final   Parainfluenza Virus 4 NOT DETECTED NOT DETECTED Final   Respiratory Syncytial Virus NOT DETECTED NOT DETECTED Final   Bordetella pertussis NOT DETECTED NOT DETECTED Final   Bordetella Parapertussis NOT DETECTED NOT DETECTED Final   Chlamydophila pneumoniae NOT DETECTED NOT DETECTED Final   Mycoplasma pneumoniae NOT DETECTED NOT DETECTED Final    Comment: Performed at [facility], [address], [place], [place] [address]    Labs: CBC: Recent Labs  Lab 06/29/24 1037 06/30/24 0429  WBC 8.1 8.2  NEUTROABS 7.1  --   HGB 12.9 11.7*  HCT 39.9 35.5*  MCV 96.4 98.3  PLT 550* 342   Basic Metabolic Panel: Recent Labs  Lab 06/29/24 1037 06/30/24 0429  NA 132* 127*  K 3.6 3.2*  CL 98 100  CO2 21* 24  GLUCOSE 133* 97  BUN 12 7*  CREATININE 0.52 0.45  CALCIUM  9.2 8.6*  Liver Function Tests: Recent Labs  Lab 06/29/24 1037  AST 25  ALT 14  ALKPHOS 61  BILITOT 0.9  PROT 8.0  ALBUMIN  4.3    Discharge time spent: 35 minutes.  Signed: Elgin Lam, MD [facility] 07/02/2024

## 2024-07-02 NOTE — Plan of Care (Signed)

## 2024-07-02 NOTE — Progress Notes (Signed)
 PROGRESS NOTE    SHAYE ELLING  FMW:[id number] DOB: 06/22/1930 DOA: 06/29/2024 PCP: Sherlynn Madden, MD   Brief Narrative: Sara Davila is a 89 y.o. female with a history of hypertension, hypothyroidism, hyponatremia, seizures.  Patient presented secondary to dyspnea and increased work of breathing concerning for worsening pneumonia. Patient started empirically on antibiotics. Patient declines further workup.   Assessment and Plan:  Sepsis Present on admission, secondary to pneumonia. Blood cultures obtained and antibiotics started.  Community acquired pneumonia Presumed diagnosis as she was being treated for this as an outpatient, although chest x-ray is unremarkable for acute illness. Patient with fever, tachypnea and dyspnea. RVP negative. Procalcitonin undetectable. Patient declining further workup, but agreeable to antibiotics. -Continue Ceftriaxone  and azithromycin   Hypothyroidism -Continue Synthroid   Hypokalemia Potassium supplementation given.  Chronic hyponatremia -Continue sodium tablets and Lasix   Seizure disorder -Continue Keppra   Dysuria Patient is concerned about a UTI and requests evaluation of her symptoms. -Follow-up urine culture  Goals of care Patient reports readiness to die and and readiness to meet Jesus. She declines further workup this admission and requests to return to her facility. Patient confirms DNR and DNI.   DVT prophylaxis: Lovenox  Code Status:   Code Status: Limited: Do not attempt resuscitation (DNR) -DNR-LIMITED -Do Not Intubate/DNI  Family Communication: None at bedside. Disposition Plan: Discharge back to facility once able (bed availability, insurance authorization), per TOC.   Consultants:  None  Procedures:  None  Antimicrobials: Ceftriaxone  Azithromycin     Subjective: No issues noted from overnight events.  Objective: BP 135/64 (BP Location: Right Arm)   Pulse 92   Temp 98.2 F (36.8 C) (Oral)    Resp 18   Ht 4' 9 (1.448 m)   Wt 48.1 kg   SpO2 98%   BMI 22.95 kg/m   Examination:  General exam: Appears calm and comfortable Respiratory system: Respiratory effort normal.   Data Reviewed: I have personally reviewed following labs and imaging studies  CBC Lab Results  Component Value Date   WBC 8.2 06/30/2024   RBC 3.61 (L) 06/30/2024   HGB 11.7 (L) 06/30/2024   HCT 35.5 (L) 06/30/2024   MCV 98.3 06/30/2024   MCH 32.4 06/30/2024   PLT 342 06/30/2024   MCHC 33.0 06/30/2024   RDW 12.3 06/30/2024   LYMPHSABS 0.5 (L) 06/29/2024   MONOABS 0.3 06/29/2024   EOSABS 0.0 06/29/2024   BASOSABS 0.1 06/29/2024     Last metabolic panel Lab Results  Component Value Date   NA 127 (L) 06/30/2024   K 3.2 (L) 06/30/2024   CL 100 06/30/2024   CO2 24 06/30/2024   BUN 7 (L) 06/30/2024   CREATININE 0.45 06/30/2024   GLUCOSE 97 06/30/2024   GFRNONAA >60 06/30/2024   GFRAA >60 07/14/2020   CALCIUM  8.6 (L) 06/30/2024   PROT 8.0 06/29/2024   ALBUMIN  4.3 06/29/2024   LABGLOB 3.1 03/30/2018   BILITOT 0.9 06/29/2024   ALKPHOS 61 06/29/2024   AST 25 06/29/2024   ALT 14 06/29/2024   ANIONGAP 3 (L) 06/30/2024    GFR: Estimated Creatinine Clearance: 30.7 mL/min (by C-G formula based on SCr of 0.45 mg/dL).  Recent Results (from the past 240 hours)  Blood Culture (routine x 2)     Status: None (Preliminary result)   Collection Time: 06/29/24 10:37 AM   Specimen: BLOOD  Result Value Ref Range Status   Specimen Description   Final    BLOOD RIGHT ANTECUBITAL Performed at [facility], [address] [place], [place] [address]    Special Requests   Final    BOTTLES DRAWN AEROBIC AND ANAEROBIC Blood Culture results may not be optimal due to an inadequate volume of blood received in culture bottles Performed at [facility], [address], [place], [place] [address]    Culture   Final    NO GROWTH 2 DAYS Performed at [facility], [address], [place], [place] [address]    Report Status PENDING  Incomplete  Blood Culture (routine x 2)     Status: None (Preliminary result)   Collection Time: 06/29/24 10:50 AM   Specimen: BLOOD  Result Value Ref Range Status   Specimen Description   Final    BLOOD BLOOD LEFT HAND Performed at [facility], [address], [place], [place] [address]    Special Requests   Final    BOTTLES DRAWN AEROBIC AND ANAEROBIC Blood Culture results may not be optimal due to an inadequate volume of blood received in culture bottles Performed at [facility], [address] [place], [place] [address]    Culture   Final    NO GROWTH 2 DAYS Performed at [facility], [address] [place], [place] [address]    Report Status PENDING  Incomplete  Resp panel by RT-PCR (RSV, Flu A&B, Covid) Urine, Catheterized     Status: None   Collection Time: 06/29/24 11:36 AM   Specimen: Urine, Catheterized; Nasal Swab  Result Value Ref Range Status   SARS Coronavirus 2 by RT PCR NEGATIVE NEGATIVE Final    Comment: (NOTE) SARS-CoV-2 target nucleic acids are NOT DETECTED.  The SARS-CoV-2 RNA is generally detectable in upper respiratory specimens during the acute phase of infection. The lowest concentration of SARS-CoV-2 viral copies this assay can detect is 138 copies/mL. A negative result does not preclude SARS-Cov-2 infection and should not be used as the sole basis for treatment or other patient management decisions. A negative result may occur with  improper specimen collection/handling, submission of specimen other than nasopharyngeal swab, presence of viral mutation(s) within the areas targeted by this assay, and inadequate number of viral copies(<138 copies/mL). A negative result must be combined with clinical observations, patient history, and epidemiological information. The expected result is Negative.  Fact Sheet for Patients:   BloggerCourse.com  Fact Sheet for Healthcare Providers:  SeriousBroker.it  This test is no t yet approved or cleared by the [place]  FDA and  has been authorized for detection and/or diagnosis of SARS-CoV-2 by FDA under an Emergency Use Authorization (EUA). This EUA will remain  in effect (meaning this test can be used) for the duration of the COVID-19 declaration under Section 564(b)(1) of the Act, 21 U.S.C.section 360bbb-3(b)(1), unless the authorization is terminated  or revoked sooner.       Influenza A by PCR NEGATIVE NEGATIVE Final   Influenza B by PCR NEGATIVE NEGATIVE Final    Comment: (NOTE) The Xpert Xpress SARS-CoV-2/FLU/RSV plus assay is intended as an aid in the diagnosis of influenza from Nasopharyngeal swab specimens and should not be used as a sole basis for treatment. Nasal washings and aspirates are unacceptable for Xpert Xpress SARS-CoV-2/FLU/RSV testing.  Fact Sheet for Patients: BloggerCourse.com  Fact Sheet for Healthcare Providers: SeriousBroker.it  This test is not yet approved or cleared by the [place]  FDA and has been authorized for detection and/or diagnosis of SARS-CoV-2 by FDA under an Emergency Use Authorization (EUA).  This EUA will remain in effect (meaning this test can be used) for the duration of the COVID-19 declaration under Section 564(b)(1) of the Act, 21 U.S.C. section 360bbb-3(b)(1), unless the authorization is terminated or revoked.     Resp Syncytial Virus by PCR NEGATIVE NEGATIVE Final    Comment: (NOTE) Fact Sheet for Patients: BloggerCourse.com  Fact Sheet for Healthcare Providers: SeriousBroker.it  This test is not yet approved or cleared by the [place]  FDA and has been authorized for detection and/or diagnosis of SARS-CoV-2 by FDA under an Emergency Use  Authorization (EUA). This EUA will remain in effect (meaning this test can be used) for the duration of the COVID-19 declaration under Section 564(b)(1) of the Act, 21 U.S.C. section 360bbb-3(b)(1), unless the authorization is terminated or revoked.  Performed at [facility], [address] [place], [place] [address]   Respiratory (~20 pathogens) panel by PCR     Status: None   Collection Time: 06/29/24 11:36 AM   Specimen: Nasopharyngeal Swab; Respiratory  Result Value Ref Range Status   Adenovirus NOT DETECTED NOT DETECTED Final   Coronavirus 229E NOT DETECTED NOT DETECTED Final    Comment: (NOTE) The Coronavirus on the Respiratory Panel, DOES NOT test for the novel  Coronavirus (2019 nCoV)    Coronavirus HKU1 NOT DETECTED NOT DETECTED Final   Coronavirus NL63 NOT DETECTED NOT DETECTED Final   Coronavirus OC43 NOT DETECTED NOT DETECTED Final   Metapneumovirus NOT DETECTED NOT DETECTED Final   Rhinovirus / Enterovirus NOT DETECTED NOT DETECTED Final   Influenza A NOT DETECTED NOT DETECTED Final   Influenza B NOT DETECTED NOT DETECTED Final   Parainfluenza Virus 1 NOT DETECTED NOT DETECTED Final   Parainfluenza Virus 2 NOT DETECTED NOT DETECTED Final   Parainfluenza Virus 3 NOT DETECTED NOT DETECTED Final   Parainfluenza Virus 4 NOT DETECTED NOT DETECTED Final   Respiratory Syncytial Virus NOT DETECTED NOT DETECTED Final   Bordetella pertussis NOT DETECTED NOT DETECTED Final   Bordetella Parapertussis NOT DETECTED NOT DETECTED Final   Chlamydophila pneumoniae NOT DETECTED NOT DETECTED Final   Mycoplasma pneumoniae NOT DETECTED NOT DETECTED Final    Comment: Performed at [facility], [address], [place], [place] [address]      Radiology Studies: No results found.     LOS: 3 days    Elgin Lam, MD [facility] 07/02/2024, 9:56 AM   If 7PM-7AM, please contact night-coverage [email or web address]

## 2024-07-03 ENCOUNTER — Non-Acute Institutional Stay (SKILLED_NURSING_FACILITY): Admitting: Nurse Practitioner

## 2024-07-03 ENCOUNTER — Encounter: Payer: Self-pay | Admitting: Nurse Practitioner

## 2024-07-03 DIAGNOSIS — F419 Anxiety disorder, unspecified: Secondary | ICD-10-CM

## 2024-07-03 DIAGNOSIS — J189 Pneumonia, unspecified organism: Secondary | ICD-10-CM | POA: Diagnosis not present

## 2024-07-03 DIAGNOSIS — K5901 Slow transit constipation: Secondary | ICD-10-CM

## 2024-07-03 DIAGNOSIS — G2581 Restless legs syndrome: Secondary | ICD-10-CM | POA: Diagnosis not present

## 2024-07-03 DIAGNOSIS — E871 Hypo-osmolality and hyponatremia: Secondary | ICD-10-CM

## 2024-07-03 DIAGNOSIS — F5105 Insomnia due to other mental disorder: Secondary | ICD-10-CM

## 2024-07-03 DIAGNOSIS — Z8673 Personal history of transient ischemic attack (TIA), and cerebral infarction without residual deficits: Secondary | ICD-10-CM

## 2024-07-03 DIAGNOSIS — K219 Gastro-esophageal reflux disease without esophagitis: Secondary | ICD-10-CM

## 2024-07-03 DIAGNOSIS — A419 Sepsis, unspecified organism: Secondary | ICD-10-CM | POA: Diagnosis not present

## 2024-07-03 DIAGNOSIS — M150 Primary generalized (osteo)arthritis: Secondary | ICD-10-CM

## 2024-07-03 DIAGNOSIS — I872 Venous insufficiency (chronic) (peripheral): Secondary | ICD-10-CM | POA: Diagnosis not present

## 2024-07-03 DIAGNOSIS — E039 Hypothyroidism, unspecified: Secondary | ICD-10-CM

## 2024-07-03 NOTE — Assessment & Plan Note (Signed)
 taking Levothyroxine , TSH 1.72 03/30/24

## 2024-07-03 NOTE — Assessment & Plan Note (Signed)
 takes Gabapentin, off Requip. Vit B12 818 03/25/23(taking Vit B12)

## 2024-07-03 NOTE — Assessment & Plan Note (Signed)
 taking  Furosemide , 06/20/20 EF 60-65%, Bun/creat 7/0.45 06/30/24

## 2024-07-03 NOTE — Progress Notes (Unsigned)
 Location:   [facility] Room Number: 60A Place of Service:  SNF (31) Provider: Larwance Kenly Henckel NP  Sherlynn Madden, MD  Patient Care Team: Sherlynn Madden, MD as PCP - General (Internal Medicine)  Extended Emergency Contact Information Primary Emergency Contact: Hertzwig,Kevin Address: [address]          [address]          [place], [place] [address] [place] Home Phone: [phone] Mobile Phone: [phone] Relation: Son Secondary Emergency Contact: Hertzwig,Eric Address: [address] [place] , [place] [address] [place] Home Phone: [phone] Mobile Phone: [phone] Relation: Son  Code Status: DNR Goals of care: Advanced Directive information    06/29/2024    5:00 PM  Advanced Directives  Type of Advance Directive Out of facility DNR (pink MOST or yellow form)  Does patient want to make changes to medical advance directive? No - Patient declined     Chief Complaint  Patient presents with  . Acute Visit    Medication review after hospital stay    HPI:  Pt is a 89 y.o. female seen today for an acute visit for medication review  Hospitalized 06/29/2024 to 07/02/2024 for pneumonia/sepsis(blood culture showed no growth), started antibiotics cefdinir  and azithromycin  upon hospital discharge, the patient remained awake and persistent cough(chest x-ray is unremarkable for acute illness and that urine culture showed no growth).  The patient declined further workup in the hospital and the preferred palliative care at skilled nursing facility [facility]   Generalized frailty, still ambulates with walker slowly.              ED eval 06/02/24 for fall, mechanical, resulted peri mouth contusion, pending dental eval. Left elbow/wrist skin tears. CT head/neck/maxilofacial, Xray chest, left  shoulder/elbow/wrist, pelvis no acute processes.                          UA 06/02/24 ED large leukocytosis, positive nitrite, wbc>50, culture  90,000c/ml, KLEBSIELLA PNEUMONIAE Abnormal  susceptible to cephalosporins.              Edema BLE, taking  Furosemide , 06/20/20 EF 60-65%, Bun/creat 7/0.45 06/30/24             RLS takes Gabapentin , off Requip. Vit B12 818 03/25/23(taking Vit B12)             Seizure, saw neurology, presentation of left sided weakness again. EEG showed focal seizure right frontal region. Takes Keppra  since 07/23/20.             Hypothyroidism, taking Levothyroxine , TSH 1.72 03/30/24             Hyponatremia, stable, Na 127 06/30/2024, declined Nacl in the past.   Hypokalemia, K3.2 06/30/24, on potassium supplement             Anxiety, not sleeping well, prn Lorazepamavailable to her, failed Mirtazapine, Lexapro, Ambien .              GERD, stable, prn Omeprazole,  Hgb 11.7 on 06/30/2024             Constipation, takes MiraLax , Senokot S             OA, left hip, s/p total left hip, multiple sites, takes Tylenol , Aleve , Gabapentin  for pain in toes, failed Methocarbamol  and Meloxicam  recommended by Ortho-felt wobbly.  OP off Boniva, Ca, Vit D, declined DEXA, Vit D 16 10/07/21.              TIA, x2, off  Atorvastatin  per patient's request, on ASA              Urinary frequency, prolapsed bladder, chronic,  declined Myrbetriq 2/2 to the cost.        Past Medical History:  Diagnosis Date  . Abdominal pain 02/26/2017  . Anxiety   . Arthritis    Left Knee, Wrist, Back and Hips  . Cervical vertebral fusion   . Diverticulitis   . Diverticulosis   . Fibromyalgia   . GERD (gastroesophageal reflux disease)   . Gout 02/2018   L hand  . Hypertension     after d/c from hospital no current problems or medication  . Hypothyroidism   . Pelvis fracture (HCC)   . Peritoneal free air 03/29/2012  . Pneumonia   . Pneumoperitoneum 02/26/2017  . Pre-diabetes   . Seizure (HCC)   . Thyroid  disorder   . Toe pain 09/06/2016   Past Surgical History:  Procedure Laterality Date  . ABDOMINAL HYSTERECTOMY    . BOWEL RESECTION  N/A 07/09/2017   Procedure: SMALL BOWEL RESECTION;  Surgeon: Gladis Cough, MD;  Location: [facility];  Service: General;  Laterality: N/A;  . CERVICAL FUSION     x2  . CONVERSION TO TOTAL HIP Left 10/31/2020   Procedure: CONVERSION TO ANTERIOR TOTAL HIP;  Surgeon: Ernie Cough, MD;  Location: [facility];  Service: Orthopedics;  Laterality: Left;  2 hrs  . FEMUR IM NAIL Left 03/03/2020   Procedure: INTRAMEDULLARY (IM) NAIL FEMORAL;  Surgeon: Ernie Cough, MD;  Location: [facility];  Service: Orthopedics;  Laterality: Left;  . LAPAROSCOPIC APPENDECTOMY N/A 03/04/2017   Procedure: APPENDECTOMY LAPAROSCOPIC;  Surgeon: Cough Gladis, MD;  Location: [facility];  Service: General;  Laterality: N/A;  . LAPAROSCOPY N/A 03/04/2017   Procedure: LAPAROSCOPY DIAGNOSTIC, ENTEROLYSIS;  Surgeon: Cough Gladis, MD;  Location: [facility];  Service: General;  Laterality: N/A;  . LAPAROTOMY N/A 07/09/2017   Procedure: EXPLORATORY LAPAROTOMY;  Surgeon: Gladis Cough, MD;  Location: [facility];  Service: General;  Laterality: N/A;  . SPINE SURGERY     Lumbar- rod placement  . TONSILLECTOMY     89y/o  . TOTAL VAGINAL HYSTERECTOMY      Allergies  Allergen Reactions  . Codeine Rash  . Penicillins Rash    Has patient had a PCN reaction causing immediate rash, facial/tongue/throat swelling, SOB or lightheadedness with hypotension: No Has patient had a PCN reaction causing severe rash involving mucus membranes or skin necrosis: No Has patient had a PCN reaction that required hospitalization: No Has patient had a PCN reaction occurring within the last 10 years: No If all of the above answers are NO, then may proceed with Cephalosporin use.  Tolerated Cephalosporin 10/31/20    . Levaquin [Levofloxacin] Other (See Comments)    Allergic, per MAR  . Tuberculin Tests Other (See Comments)    Allergic, per MAR  . Tuberculin, Ppd Other (See Comments)    Allergic, per MAR    Allergies as of 07/03/2024       Reactions   Codeine  Rash   Penicillins Rash   Has patient had a PCN reaction causing immediate rash, facial/tongue/throat swelling, SOB or lightheadedness with hypotension: No Has patient had a PCN reaction causing severe rash involving mucus membranes or skin necrosis: No Has patient had a PCN  reaction that required hospitalization: No Has patient had a PCN reaction occurring within the last 10 years: No If all of the above answers are NO, then may proceed with Cephalosporin use. Tolerated Cephalosporin 10/31/20   Levaquin [levofloxacin] Other (See Comments)   Allergic, per MAR   Tuberculin Tests Other (See Comments)   Allergic, per MAR   Tuberculin, Ppd Other (See Comments)   Allergic, per [facility]        Medication List        Accurate as of July 03, 2024 11:59 PM. If you have any questions, ask your nurse or doctor.          acetaminophen  500 MG tablet Commonly known as: TYLENOL  Take 1,000 mg by mouth 2 (two) times daily as needed (for pain).   aspirin  EC 81 MG tablet Take 81 mg by mouth at bedtime. Swallow whole.   azithromycin  500 MG tablet Commonly known as: Zithromax  Take 1 tablet (500 mg total) by mouth daily for 2 days.   cefdinir  300 MG capsule Commonly known as: OMNICEF  Take 1 capsule (300 mg total) by mouth daily for 2 days.   cyanocobalamin 1000 MCG tablet Commonly known as: VITAMIN B12 Take 1,000 mcg by mouth daily.   furosemide  20 MG tablet Commonly known as: LASIX  Take 20 mg by mouth every other day.   gabapentin  100 MG capsule Commonly known as: NEURONTIN  Take 1 capsule (100 mg total) by mouth at bedtime.   levETIRAcetam  500 MG 24 hr tablet Commonly known as: Keppra  XR Take 1 tablet (500 mg total) by mouth daily. What changed: when to take this   levothyroxine  75 MCG tablet Commonly known as: SYNTHROID  Take 1 tablet (75 mcg total) by mouth daily before breakfast.   LORazepam  0.5 MG tablet Commonly known as: ATIVAN  Take 0.5 mg by mouth every 8 (eight)  hours as needed for anxiety.   Melatonin 5 MG Chew Chew 5 mg by mouth at bedtime.   meloxicam  7.5 MG tablet Commonly known as: MOBIC  Take 1 tablet (7.5 mg total) by mouth daily.   Multivitamin Tabs Take 1 tablet by mouth daily with breakfast.   mupirocin  ointment 2 % Commonly known as: BACTROBAN  Apply 1 Application topically as needed (for irritation- affected areas as directed). Apply to irritated area topically as needed for irritation.   omeprazole 20 MG capsule Commonly known as: PRILOSEC Take 20 mg by mouth daily as needed (for indigestion/upset stomach).   OXYGEN Inhale 2 L/min into the lungs as needed (TO KEEP SATS AT >90%).   Oyster Shell 500 MG Tabs Take 500 mg by mouth daily.   Pataday 0.7 % Soln Generic drug: Olopatadine HCl Place 1 drop into both eyes as needed (for dryness).   polyethylene glycol 17 g packet Commonly known as: MIRALAX  / GLYCOLAX  Take 17 g by mouth daily as needed for mild constipation.   senna 8.6 MG tablet Commonly known as: SENOKOT Take 1 tablet by mouth daily.   sennosides-docusate sodium  8.6-50 MG tablet Commonly known as: SENOKOT-S Take 1-2 tablets by mouth daily as needed for constipation.   sodium chloride  1 g tablet Take 1 tablet (1 g total) by mouth every other day.   sodium fluoride  1.1 % Gel dental gel Commonly known as: FLUORISHIELD Place 1 Application onto teeth in the morning.        Review of Systems  Constitutional:  Positive for appetite change and fatigue. Negative for fever.  HENT:  Positive for hearing loss. Negative for congestion  and trouble swallowing.   Eyes:  Negative for visual disturbance.  Respiratory:  Positive for cough and shortness of breath. Negative for choking, chest tightness and wheezing.        DOE  Cardiovascular:  Positive for leg swelling. Negative for chest pain and palpitations.  Gastrointestinal:  Negative for abdominal pain and constipation.  Genitourinary:  Positive for frequency.  Negative for dysuria and urgency.       Bathroom trips 3-4x/night  Musculoskeletal:  Positive for arthralgias, back pain and gait problem.       Left hip pain, s/p total left hip arthroplasty. Multiple sites aches in am, better as day goes by. Pain in toes.  Chronic back pain  Skin:  Negative for color change.  Neurological:  Negative for seizures, weakness and headaches.       Restless legs at night.  Memory lapses.   Psychiatric/Behavioral:  Positive for sleep disturbance. Negative for behavioral problems. The patient is not nervous/anxious.     Immunization History  Administered Date(s) Administered  . Fluzone Influenza virus vaccine,trivalent (IIV3), split virus 08/11/2018, 08/03/2019  . Influenza, High Dose Seasonal PF 09/01/2023  . Influenza-Unspecified 09/01/2018, 09/10/2020, 09/18/2021, 09/21/2022  . Moderna Covid-19 Fall Seasonal Vaccine 8yrs & older 09/15/2023  . Moderna Sars-Covid-2 Vaccination 12/04/2019, 01/01/2020  . Pfizer Covid-19 Vaccine Bivalent Booster 63yrs & up 10/01/2022  . Pneumococcal Conjugate-13 05/29/2014  . Pneumococcal Polysaccharide-23 06/14/2015  . Tdap 09/14/2012  . Zoster Recombinant(Shingrix) 02/10/2022, 05/11/2022  . Zoster, Live 04/30/2004, 04/13/2018, 07/05/2018   Pertinent  Health Maintenance Due  Topic Date Due  . DEXA SCAN  Never done  . INFLUENZA VACCINE  06/30/2024      11/04/2020   12:00 PM 04/23/2021   11:34 AM 12/08/2022    9:52 AM 12/11/2022    2:50 PM 05/19/2023    9:23 AM  Fall Risk  Falls in the past year?   0 0 1  Was there an injury with Fall?   0 0 1  Fall Risk Category Calculator   0 0 2  Fall Risk Category (Retired)   Low  Low    (RETIRED) Patient Fall Risk Level High fall risk  Moderate fall risk  Low fall risk  Low fall risk    Patient at Risk for Falls Due to   No Fall Risks No Fall Risks History of fall(s);Impaired balance/gait;Impaired mobility  Fall risk Follow up   Falls evaluation completed  Falls evaluation  completed  Falls evaluation completed     Data saved with a previous flowsheet row definition   Functional Status Survey:    Vitals:   07/03/24 1238  BP: 129/69  Pulse: 71  Resp: 20  Temp: 97.6 F (36.4 C)  SpO2: 92%   There is no height or weight on file to calculate BMI. Physical Exam Constitutional:      Appearance: She is ill-appearing.  HENT:     Head: Normocephalic and atraumatic.     Mouth/Throat:     Mouth: Mucous membranes are moist.  Eyes:     Extraocular Movements: Extraocular movements intact.     Pupils: Pupils are equal, round, and reactive to light.  Cardiovascular:     Rate and Rhythm: Normal rate and regular rhythm.     Heart sounds: No murmur heard.    Comments: Weak left DP pulse, more symptomatic tingling sensation at night.  Pulmonary:     Effort: Pulmonary effort is normal.     Breath sounds: Rales present.  Comments: Fine rales R lung base Abdominal:     General: Bowel sounds are normal.     Palpations: Abdomen is soft.     Tenderness: There is no abdominal tenderness.  Musculoskeletal:     Cervical back: Normal range of motion and neck supple.     Right lower leg: Edema present.     Left lower leg: Edema present.     Comments: mild scoliosis. Hx of back surgeries x3 with left sciatica pain. s/p total left hip replacement. R SIJ pain, positional, palpated, still able to bear weight, walking BLE edema trace    Skin:    General: Skin is warm and dry.  Neurological:     General: No focal deficit present.     Mental Status: She is alert and oriented to person, place, and time. Mental status is at baseline.     Gait: Gait abnormal.     Comments: Using walker   Psychiatric:        Mood and Affect: Mood normal.        Behavior: Behavior normal.        Thought Content: Thought content normal.        Judgment: Judgment normal.     Labs reviewed: Recent Labs    06/02/24 1729 06/20/24 0000 06/29/24 1037 06/30/24 0429  NA 134* 128*   132* 132* 127*  K 3.8 4.1  4.3 3.6 3.2*  CL 97* 95*  96* 98 100  CO2 24 25*  28* 21* 24  GLUCOSE 118*  --  133* 97  BUN 16 14  13 12  7*  CREATININE 0.93 0.5  0.6 0.52 0.45  CALCIUM  10.0 9.9  9.8 9.2 8.6*   Recent Labs    05/11/24 0000 06/20/24 0000 06/29/24 1037  AST 17 17 25   ALT 10 9 14   ALKPHOS 43 67 61  BILITOT  --   --  0.9  PROT  --   --  8.0  ALBUMIN  3.7 4.4 4.3   Recent Labs    05/11/24 0000 06/02/24 1729 06/20/24 0000 06/29/24 1037 06/30/24 0429  WBC 6.1 8.9 13.3 8.1 8.2  NEUTROABS 3,569.00  --  10,800.00 7.1  --   HGB 11.8* 12.9 12.6 12.9 11.7*  HCT 36 38.5 37 39.9 35.5*  MCV  --  97.0  --  96.4 98.3  PLT 441* 385 457* 550* 342   Lab Results  Component Value Date   TSH 0.931 06/29/2024   Lab Results  Component Value Date   HGBA1C 5.8 (H) 10/21/2020   Lab Results  Component Value Date   CHOL 171 06/20/2020   HDL 59 06/20/2020   LDLCALC 94 06/20/2020   TRIG 89 06/20/2020   CHOLHDL 2.9 06/20/2020    Significant Diagnostic Results in last 30 days:  DG Pelvis 1-2 Views Result Date: 06/29/2024 CLINICAL DATA:  Status post fall EXAM: PELVIS - 1 VIEW COMPARISON:  Pelvis radiograph dated 06/02/2024 FINDINGS: Left hip arthroplasty hardware appears intact. Partially imaged lumbar spinal fusion hardware appears intact. There is no evidence of pelvic fracture or diastasis. No pelvic bone lesions are seen. Degenerative changes of the right hip. IMPRESSION: 1. No acute fracture or dislocation. 2. Left hip arthroplasty hardware appears intact. Electronically Signed   By: Limin  Xu M.D.   On: 06/29/2024 13:12   DG Chest Port 1 View Result Date: 06/29/2024 CLINICAL DATA:  Questionable sepsis - evaluate for abnormality. EXAM: PORTABLE CHEST 1 VIEW COMPARISON:  06/02/2024. FINDINGS: Bilateral lung fields  are clear. Bilateral costophrenic angles are clear. Stable cardio-mediastinal silhouette. No acute osseous abnormalities. The soft tissues are within normal  limits. IMPRESSION: No active disease. Electronically Signed   By: Ree Molt M.D.   On: 06/29/2024 10:56    Assessment/Plan: CAP (community acquired pneumonia) Hospitalized 06/29/2024 to 07/02/2024 for pneumonia/sepsis(blood culture showed no growth), started antibiotics cefdinir  and azithromycin  upon hospital discharge, the patient remained awake and persistent cough(chest x-ray is unremarkable for acute illness and that urine culture showed no growth).  The patient declined further workup in the hospital and the preferred palliative care at skilled nursing facility [facility]  Venous insufficiency taking  Furosemide , 06/20/20 EF 60-65%, Bun/creat 7/0.45 06/30/24  Restless leg syndrome  takes Gabapentin , off Requip. Vit B12 818 03/25/23(taking Vit B12)  Sepsis (HCC) saw neurology, presentation of left sided weakness again. EEG showed focal seizure right frontal region. Takes Keppra  since 07/23/20  Hypothyroidism taking Levothyroxine , TSH 1.72 03/30/24  Hyponatremia Hyponatremia, stable, Na 127 06/30/2024, declined Nacl in the past.  Hypokalemia, K3.2 06/30/24, on potassium supplement BMP one week  Insomnia secondary to anxiety not sleeping well, prn Lorazepamavailable to her, failed Mirtazapine, Lexapro, Ambien .   GERD (gastroesophageal reflux disease) stable, prn Omeprazole,  Hgb 11.7 on 06/30/2024  Slow transit constipation Stable  Osteoarthritis, multiple sites  left hip, s/p total left hip, multiple sites, takes Tylenol , Aleve , Gabapentin  for pain in toes, failed Methocarbamol  and Meloxicam  recommended by Ortho-felt wobbly.   History of TIA (transient ischemic attack) x2, off  Atorvastatin  per patient's request, on ASA     Family/ staff Communication: Plan of care reviewed with the patient and charge nurse  Labs/tests ordered: BMP

## 2024-07-03 NOTE — Assessment & Plan Note (Signed)
 Hyponatremia, stable, Na 127 06/30/2024, declined Nacl in the past.  Hypokalemia, K3.2 06/30/24, on potassium supplement BMP one week

## 2024-07-03 NOTE — Assessment & Plan Note (Signed)
 Hospitalized 06/29/2024 to 07/02/2024 for pneumonia/sepsis(blood culture showed no growth), started antibiotics cefdinir  and azithromycin  upon hospital discharge, the patient remained awake and persistent cough(chest x-ray is unremarkable for acute illness and that urine culture showed no growth).  The patient declined further workup in the hospital and the preferred palliative care at skilled nursing facility [facility]

## 2024-07-03 NOTE — Assessment & Plan Note (Signed)
 left hip, s/p total left hip, multiple sites, takes Tylenol , Aleve , Gabapentin  for pain in toes, failed Methocarbamol  and Meloxicam recommended by Ortho-felt wobbly.

## 2024-07-03 NOTE — Assessment & Plan Note (Signed)
 stable, prn Omeprazole,  Hgb 11.7 on 06/30/2024

## 2024-07-03 NOTE — Assessment & Plan Note (Signed)
saw neurology, presentation of left sided weakness again. EEG showed focal seizure right frontal region. Takes Keppra since 07/23/20

## 2024-07-03 NOTE — Assessment & Plan Note (Signed)
 Stable

## 2024-07-03 NOTE — Assessment & Plan Note (Signed)
x2, off  Atorvastatin per patient's request, on ASA

## 2024-07-03 NOTE — Assessment & Plan Note (Signed)
 not sleeping well, prn Lorazepamavailable to her, failed Mirtazapine, Lexapro, Ambien .

## 2024-07-04 LAB — CULTURE, BLOOD (ROUTINE X 2)
Culture: NO GROWTH
Culture: NO GROWTH

## 2024-07-13 LAB — BASIC METABOLIC PANEL WITH GFR
BUN: 10 (ref 4–21)
CO2: 26 — AB (ref 13–22)
Chloride: 99 (ref 99–108)
Creatinine: 0.6 (ref 0.5–1.1)
Glucose: 93
Potassium: 4.8 meq/L (ref 3.5–5.1)
Sodium: 132 — AB (ref 137–147)

## 2024-07-13 LAB — COMPREHENSIVE METABOLIC PANEL WITH GFR
Calcium: 9.8 (ref 8.7–10.7)
eGFR: 86

## 2024-07-20 ENCOUNTER — Encounter: Payer: Self-pay | Admitting: Sports Medicine

## 2024-07-20 ENCOUNTER — Non-Acute Institutional Stay (SKILLED_NURSING_FACILITY): Payer: Self-pay | Admitting: Sports Medicine

## 2024-07-20 DIAGNOSIS — R569 Unspecified convulsions: Secondary | ICD-10-CM

## 2024-07-20 DIAGNOSIS — M150 Primary generalized (osteo)arthritis: Secondary | ICD-10-CM | POA: Diagnosis not present

## 2024-07-20 DIAGNOSIS — K219 Gastro-esophageal reflux disease without esophagitis: Secondary | ICD-10-CM | POA: Diagnosis not present

## 2024-07-20 DIAGNOSIS — A419 Sepsis, unspecified organism: Secondary | ICD-10-CM

## 2024-07-20 DIAGNOSIS — G2581 Restless legs syndrome: Secondary | ICD-10-CM

## 2024-07-20 DIAGNOSIS — E039 Hypothyroidism, unspecified: Secondary | ICD-10-CM

## 2024-07-20 NOTE — Progress Notes (Unsigned)
 Provider:  Sherlynn Madden, MD Location:  [facility] Room Number: [place] Cert [id number] Place of Service:  SNF (31)  PCP: Sherlynn Madden, MD Patient Care Team: Sherlynn Madden, MD as PCP - General (Internal Medicine)  Extended Emergency Contact Information Primary Emergency Contact: Hertzwig,Kevin Address: [address]          [address]          [place], [place] [address] [place] Home Phone: [phone] Mobile Phone: [phone] Relation: Son Secondary Emergency Contact: Hertzwig,Eric Address: [address] [place] , [place] [address] [place] Home Phone: [phone] Mobile Phone: [phone] Relation: Son  Code Status: DNR Goals of Care: Advanced Directive information    07/20/2024   11:05 AM  Advanced Directives  Does Patient Have a Medical Advance Directive? Yes  Type of Advance Directive Living will;Out of facility DNR (pink MOST or yellow form)  Does patient want to make changes to medical advance directive? No - Patient declined      Chief Complaint  Patient presents with   New Admit To SNF    New admission.    HPI: Patient is a 89 y.o. female seen today for admission to  Past Medical History:  Diagnosis Date   Abdominal pain 02/26/2017   Anxiety    Arthritis    Left Knee, Wrist, Back and Hips   Cervical vertebral fusion    Diverticulitis    Diverticulosis    Fibromyalgia    GERD (gastroesophageal reflux disease)    Gout 02/2018   L hand   Hypertension     after d/c from hospital no current problems or medication   Hypothyroidism    Pelvis fracture (HCC)    Peritoneal free air 03/29/2012   Pneumonia    Pneumoperitoneum 02/26/2017   Pre-diabetes    Seizure (HCC)    Thyroid  disorder    Toe pain 09/06/2016   Past Surgical History:  Procedure Laterality Date   ABDOMINAL HYSTERECTOMY     BOWEL RESECTION N/A 07/09/2017   Procedure: SMALL BOWEL RESECTION;   Surgeon: Gladis Cough, MD;  Location: [facility];  Service: General;  Laterality: N/A;   CERVICAL FUSION     x2   CONVERSION TO TOTAL HIP Left 10/31/2020   Procedure: CONVERSION TO ANTERIOR TOTAL HIP;  Surgeon: Ernie Cough, MD;  Location: [facility];  Service: Orthopedics;  Laterality: Left;  2 hrs   FEMUR IM NAIL Left 03/03/2020   Procedure: INTRAMEDULLARY (IM) NAIL FEMORAL;  Surgeon: Ernie Cough, MD;  Location: [facility];  Service: Orthopedics;  Laterality: Left;   LAPAROSCOPIC APPENDECTOMY N/A 03/04/2017   Procedure: APPENDECTOMY LAPAROSCOPIC;  Surgeon: Cough Gladis, MD;  Location: [facility];  Service: General;  Laterality: N/A;   LAPAROSCOPY N/A 03/04/2017   Procedure: LAPAROSCOPY DIAGNOSTIC, ENTEROLYSIS;  Surgeon: Cough Gladis, MD;  Location: [facility];  Service: General;  Laterality: N/A;   LAPAROTOMY N/A 07/09/2017   Procedure: EXPLORATORY LAPAROTOMY;  Surgeon: Gladis Cough, MD;  Location: [facility];  Service: General;  Laterality: N/A;   SPINE SURGERY     Lumbar- rod placement   TONSILLECTOMY     89y/o   TOTAL VAGINAL HYSTERECTOMY      reports that she has quit smoking. She has never used smokeless tobacco. She reports that she does not currently use alcohol. She reports that she does not use drugs. Social History   Socioeconomic History   Marital status:  Widowed    Spouse name: Not on file   Number of children: 2   Years of education: RN   Highest education level: Not on file  Occupational History   Occupation: Retired   Tobacco Use   Smoking status: Former   Smokeless tobacco: Never   Tobacco comments:    Smoked from age [age]  Vaping Use   Vaping status: Never Used  Substance and Sexual Activity   Alcohol use: Not Currently    Comment: occasional glass of wine   Drug use: No   Sexual activity: Not Currently  Other Topics Concern   Not on file  Social History Narrative   Lives at [facility] independent living    Caffeine use: occasional cup of coffee, 2-3 times a  week   Left handed         Diet: Regular      Do you drink/ eat things with caffeine? No      Marital status: Widowed                              What year were you married ? 1986      Do you live in a house, apartment,assistred living, condo, trailer, etc.)? Independent Living Home       Is it one or more stories? 4 th floor      How many persons live in your home ? 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  Yes                            Type & how often 5-6 x WK      ADVANCED DIRECTIVES (Please bring copies)      Do you have a living will? Yes      Do you have a DNR form?   Yes                    If not, do you want to discuss one?       Do you have signed POA?HPOA forms?   Yes              If so, please bring to your appointment      FUNCTIONAL STATUS- To be completed by Spouse / child / Staff       Do you have difficulty bathing or dressing yourself ? No      Do you have difficulty preparing food or eating ?  No      Do you have difficulty managing your mediation ?No      Do you have difficulty managing your finances ? No      Do you have difficulty affording your medication ? No      Social Drivers of Corporate investment banker Strain: Not on file  Food Insecurity: No Food Insecurity (06/29/2024)   Hunger Vital Sign    Worried About Running Out of Food in the Last Year: Never true    Ran Out of Food in the Last Year: Never true  Transportation Needs: No Transportation Needs (06/29/2024)   PRAPARE - Transportation    Lack of Transportation (  Medical): No    Lack of Transportation (Non-Medical): No  Physical Activity: Not on file  Stress: Not on file  Social Connections: Unknown (06/29/2024)   Social Connection and Isolation Panel    Frequency of Communication with Friends and Family: Once a week    Frequency of Social  Gatherings with Friends and Family: Once a week    Attends Religious Services: Never    Database administrator or Organizations: No    Attends Banker Meetings: Never    Marital Status: Patient declined  Catering manager Violence: Not At Risk (06/29/2024)   Humiliation, Afraid, Rape, and Kick questionnaire    Fear of Current or Ex-Partner: No    Emotionally Abused: No    Physically Abused: No    Sexually Abused: No    Functional Status Survey:    Family History  Problem Relation Age of Onset   Asthma Mother    Pulmonary embolism Mother    Macular degeneration Mother    Heart disease Father    Stroke Father    Stroke Paternal Uncle    Breast cancer Maternal Aunt    Macular degeneration Sister    Stroke Sister    Neuropathy Neg Hx     Health Maintenance  Topic Date Due   DEXA SCAN  Never done   DTaP/Tdap/Td (2 - Td or Tdap) 09/14/2022   COVID-19 Vaccine (5 - 2024-25 season) 03/15/2024   INFLUENZA VACCINE  06/30/2024   Medicare Annual Wellness (AWV)  12/22/2024   Pneumococcal Vaccine: 50+ Years  Completed   Zoster Vaccines- Shingrix  Completed   HPV VACCINES  Aged Out   Meningococcal B Vaccine  Aged Out    Allergies  Allergen Reactions   Codeine Rash   Penicillins Rash    Has patient had a PCN reaction causing immediate rash, facial/tongue/throat swelling, SOB or lightheadedness with hypotension: No Has patient had a PCN reaction causing severe rash involving mucus membranes or skin necrosis: No Has patient had a PCN reaction that required hospitalization: No Has patient had a PCN reaction occurring within the last 10 years: No If all of the above answers are NO, then may proceed with Cephalosporin use.  Tolerated Cephalosporin 10/31/20     Levaquin [Levofloxacin] Other (See Comments)    Allergic, per MAR   Tuberculin Tests Other (See Comments)    Allergic, per MAR   Tuberculin, Ppd Other (See Comments)    Allergic, per MAR    Allergies  as of 07/20/2024       Reactions   Codeine Rash   Penicillins Rash   Has patient had a PCN reaction causing immediate rash, facial/tongue/throat swelling, SOB or lightheadedness with hypotension: No Has patient had a PCN reaction causing severe rash involving mucus membranes or skin necrosis: No Has patient had a PCN reaction that required hospitalization: No Has patient had a PCN reaction occurring within the last 10 years: No If all of the above answers are NO, then may proceed with Cephalosporin use. Tolerated Cephalosporin 10/31/20   Levaquin [levofloxacin] Other (See Comments)   Allergic, per MAR   Tuberculin Tests Other (See Comments)   Allergic, per MAR   Tuberculin, Ppd Other (See Comments)   Allergic, per [facility]        Medication List        Accurate as of July 20, 2024 11:16 AM. If you have any questions, ask your nurse or doctor.  acetaminophen  500 MG tablet Commonly known as: TYLENOL  Take 1,000 mg by mouth 2 (two) times daily as needed (for pain).   aspirin  EC 81 MG tablet Take 81 mg by mouth at bedtime. Swallow whole.   cyanocobalamin 1000 MCG tablet Commonly known as: VITAMIN B12 Take 1,000 mcg by mouth daily.   furosemide  20 MG tablet Commonly known as: LASIX  Take 20 mg by mouth every other day.   gabapentin  100 MG capsule Commonly known as: NEURONTIN  Take 1 capsule (100 mg total) by mouth at bedtime.   levETIRAcetam  500 MG 24 hr tablet Commonly known as: Keppra  XR Take 1 tablet (500 mg total) by mouth daily. What changed: when to take this   levothyroxine  75 MCG tablet Commonly known as: SYNTHROID  Take 1 tablet (75 mcg total) by mouth daily before breakfast.   LORazepam  0.5 MG tablet Commonly known as: ATIVAN  Take 0.5 mg by mouth every 8 (eight) hours as needed for anxiety.   Melatonin 5 MG Chew Chew 5 mg by mouth at bedtime.   meloxicam  7.5 MG tablet Commonly known as: MOBIC  Take 1 tablet (7.5 mg total) by mouth  daily.   Multivitamin Tabs Take 1 tablet by mouth daily with breakfast.   mupirocin  ointment 2 % Commonly known as: BACTROBAN  Apply 1 Application topically as needed (for irritation- affected areas as directed). Apply to irritated area topically as needed for irritation.   omeprazole 20 MG capsule Commonly known as: PRILOSEC Take 20 mg by mouth daily as needed (for indigestion/upset stomach).   OXYGEN Inhale 2 L/min into the lungs as needed (TO KEEP SATS AT >90%).   Oyster Shell 500 MG Tabs Take 500 mg by mouth daily.   Pataday 0.7 % Soln Generic drug: Olopatadine HCl Place 1 drop into both eyes as needed (for dryness).   polyethylene glycol 17 g packet Commonly known as: MIRALAX  / GLYCOLAX  Take 17 g by mouth daily as needed for mild constipation.   potassium chloride  10 MEQ tablet Commonly known as: KLOR-CON  Take 10 mEq by mouth once. Every other day   senna 8.6 MG tablet Commonly known as: SENOKOT Take 1 tablet by mouth daily.   sennosides-docusate sodium  8.6-50 MG tablet Commonly known as: SENOKOT-S Take 1-2 tablets by mouth daily as needed for constipation.   sodium chloride  1 g tablet Take 1 tablet (1 g total) by mouth every other day.   sodium fluoride  1.1 % Gel dental gel Commonly known as: FLUORISHIELD Place 1 Application onto teeth in the morning.   zolpidem  5 MG tablet Commonly known as: AMBIEN  Take 5 mg by mouth at bedtime as needed.        Review of Systems  Vitals:   07/20/24 1055  BP: (!) 152/83  Pulse: 86  Resp: 18  Temp: 98 F (36.7 C)  SpO2: 94%  Weight: 125 lb 11.2 oz (57 kg)  Height: 4' 9 (1.448 m)   Body mass index is 27.2 kg/m. Physical Exam  Labs reviewed: Basic Metabolic Panel: Recent Labs    06/02/24 1729 06/20/24 0000 06/29/24 1037 06/30/24 0429 07/13/24 1459  NA 134*   < > 132* 127* 132*  K 3.8   < > 3.6 3.2* 4.8  CL 97*   < > 98 100 99  CO2 24   < > 21* 24 26*  GLUCOSE 118*  --  133* 97  --   BUN 16   < >  12 7* 10  CREATININE 0.93   < > 0.52 0.45  0.6  CALCIUM  10.0   < > 9.2 8.6* 9.8   < > = values in this interval not displayed.   Liver Function Tests: Recent Labs    05/11/24 0000 06/20/24 0000 06/29/24 1037  AST 17 17 25   ALT 10 9 14   ALKPHOS 43 67 61  BILITOT  --   --  0.9  PROT  --   --  8.0  ALBUMIN  3.7 4.4 4.3   No results for input(s): LIPASE, AMYLASE in the last 8760 hours. No results for input(s): AMMONIA in the last 8760 hours. CBC: Recent Labs    05/11/24 0000 06/02/24 1729 06/20/24 0000 06/29/24 1037 06/30/24 0429  WBC 6.1 8.9 13.3 8.1 8.2  NEUTROABS 3,569.00  --  10,800.00 7.1  --   HGB 11.8* 12.9 12.6 12.9 11.7*  HCT 36 38.5 37 39.9 35.5*  MCV  --  97.0  --  96.4 98.3  PLT 441* 385 457* 550* 342   Cardiac Enzymes: Recent Labs    06/02/24 1730  CKTOTAL 106   BNP: Invalid input(s): POCBNP Lab Results  Component Value Date   HGBA1C 5.8 (H) 10/21/2020   Lab Results  Component Value Date   TSH 0.931 06/29/2024   Lab Results  Component Value Date   VITAMINB12 1,034 09/02/2023   Lab Results  Component Value Date   FOLATE 19.8 12/01/2017   No results found for: IRON, TIBC, FERRITIN  Imaging and Procedures obtained prior to SNF admission: DG Pelvis 1-2 Views Result Date: 06/29/2024 CLINICAL DATA:  Status post fall EXAM: PELVIS - 1 VIEW COMPARISON:  Pelvis radiograph dated 06/02/2024 FINDINGS: Left hip arthroplasty hardware appears intact. Partially imaged lumbar spinal fusion hardware appears intact. There is no evidence of pelvic fracture or diastasis. No pelvic bone lesions are seen. Degenerative changes of the right hip. IMPRESSION: 1. No acute fracture or dislocation. 2. Left hip arthroplasty hardware appears intact. Electronically Signed   By: Limin  Xu M.D.   On: 06/29/2024 13:12   DG Chest Port 1 View Result Date: 06/29/2024 CLINICAL DATA:  Questionable sepsis - evaluate for abnormality. EXAM: PORTABLE CHEST 1 VIEW COMPARISON:   06/02/2024. FINDINGS: Bilateral lung fields are clear. Bilateral costophrenic angles are clear. Stable cardio-mediastinal silhouette. No acute osseous abnormalities. The soft tissues are within normal limits. IMPRESSION: No active disease. Electronically Signed   By: Ree Molt M.D.   On: 06/29/2024 10:56    Assessment/Plan There are no diagnoses linked to this encounter.   Family/ staff Communication:   Labs/tests ordered:

## 2024-07-21 ENCOUNTER — Encounter: Payer: Self-pay | Admitting: Sports Medicine

## 2024-08-22 LAB — BASIC METABOLIC PANEL WITH GFR
BUN: 14 (ref 4–21)
CO2: 26 — AB (ref 13–22)
Chloride: 100 (ref 99–108)
Creatinine: 0.6 (ref 0.5–1.1)
Glucose: 88
Potassium: 4.3 meq/L (ref 3.5–5.1)
Sodium: 132 — AB (ref 137–147)

## 2024-08-22 LAB — COMPREHENSIVE METABOLIC PANEL WITH GFR
Calcium: 9.9 (ref 8.7–10.7)
eGFR: 86

## 2024-08-23 ENCOUNTER — Non-Acute Institutional Stay (SKILLED_NURSING_FACILITY): Payer: Self-pay | Admitting: Nurse Practitioner

## 2024-08-23 ENCOUNTER — Encounter: Payer: Self-pay | Admitting: Nurse Practitioner

## 2024-08-23 DIAGNOSIS — F419 Anxiety disorder, unspecified: Secondary | ICD-10-CM

## 2024-08-23 DIAGNOSIS — E039 Hypothyroidism, unspecified: Secondary | ICD-10-CM

## 2024-08-23 DIAGNOSIS — R600 Localized edema: Secondary | ICD-10-CM | POA: Diagnosis not present

## 2024-08-23 DIAGNOSIS — M150 Primary generalized (osteo)arthritis: Secondary | ICD-10-CM

## 2024-08-23 DIAGNOSIS — R569 Unspecified convulsions: Secondary | ICD-10-CM | POA: Diagnosis not present

## 2024-08-23 DIAGNOSIS — F5105 Insomnia due to other mental disorder: Secondary | ICD-10-CM

## 2024-08-23 DIAGNOSIS — E871 Hypo-osmolality and hyponatremia: Secondary | ICD-10-CM

## 2024-08-23 DIAGNOSIS — K5901 Slow transit constipation: Secondary | ICD-10-CM

## 2024-08-23 DIAGNOSIS — E876 Hypokalemia: Secondary | ICD-10-CM

## 2024-08-23 DIAGNOSIS — R251 Tremor, unspecified: Secondary | ICD-10-CM

## 2024-08-23 DIAGNOSIS — I10 Essential (primary) hypertension: Secondary | ICD-10-CM

## 2024-08-23 DIAGNOSIS — K219 Gastro-esophageal reflux disease without esophagitis: Secondary | ICD-10-CM

## 2024-08-23 DIAGNOSIS — R627 Adult failure to thrive: Secondary | ICD-10-CM | POA: Insufficient documentation

## 2024-08-23 DIAGNOSIS — R6251 Failure to thrive (child): Secondary | ICD-10-CM | POA: Insufficient documentation

## 2024-08-23 DIAGNOSIS — G2581 Restless legs syndrome: Secondary | ICD-10-CM

## 2024-08-23 NOTE — Assessment & Plan Note (Signed)
 Mild elevated systolic blood pressure, adding propranolol for tremors in hands will benefit her blood pressure control

## 2024-08-23 NOTE — Assessment & Plan Note (Signed)
 The patient declined neurology referral, consented to try propranolol 10 mg twice daily

## 2024-08-23 NOTE — Assessment & Plan Note (Signed)
 taking Levothyroxine , TSH 1.72 03/30/24

## 2024-08-23 NOTE — Assessment & Plan Note (Signed)
 K 4.3 08/22/2024, on potassium supplement

## 2024-08-23 NOTE — Assessment & Plan Note (Signed)
Stable, takes MiraLax, Senokot S 

## 2024-08-23 NOTE — Assessment & Plan Note (Signed)
 Generalized frailty, wheelchair for mobility  Failure to thrive, supportive care in SNF [facility]

## 2024-08-23 NOTE — Assessment & Plan Note (Signed)
 taking  Furosemide , 06/20/20 EF 60-65%, Bun/creat 14/0.56 08/22/2024

## 2024-08-23 NOTE — Assessment & Plan Note (Signed)
 stable, prn Omeprazole,  Hgb 11.7 on 06/30/2024

## 2024-08-23 NOTE — Assessment & Plan Note (Signed)
 takes Gabapentin, off Requip. Vit B12 818 03/25/23(taking Vit B12)

## 2024-08-23 NOTE — Assessment & Plan Note (Signed)
 stable, Na 1/30 to 08/22/2024, declined Nacl in the past

## 2024-08-23 NOTE — Assessment & Plan Note (Signed)
 No active seizure,  saw neurology, presentation of left sided weakness again. EEG showed focal seizure right frontal region. Takes Keppra  since 07/23/20.

## 2024-08-23 NOTE — Progress Notes (Addendum)
 Location:   SNF [facility] Room Number: 36 Place of Service:  SNF (31) Provider: Larwance Gopal Malter NP  Sherlynn Madden, MD  Patient Care Team: Sherlynn Madden, MD as PCP - General (Internal Medicine)  Extended Emergency Contact Information Primary Emergency Contact: Hertzwig,Kevin Address: [address]          [address]          [place], [place] [address] [place] Home Phone: [phone] Mobile Phone: [phone] Relation: Son Secondary Emergency Contact: Hertzwig,Eric Address: [address]          [place] , [place] [address] [place] Home Phone: [phone] Mobile Phone: [phone] Relation: Son  Code Status: DNR Goals of care: Advanced Directive information    07/20/2024   11:05 AM  Advanced Directives  Does Patient Have a Medical Advance Directive? Yes  Type of Advance Directive Living will;Out of facility DNR (pink MOST or yellow form)  Does patient want to make changes to medical advance directive? No - Patient declined     Chief Complaint  Patient presents with   Acute Visit    Tremors in the hands    HPI:  Pt is a 89 y.o. female seen today for an acute visit for intentional tremors in hands, difficulty feeding herself, denied pain or new focal neurological deficits.  Generalized frailty, wheelchair for mobility  Failure to thrive, supportive care in SNF [facility]             Edema BLE, taking  Furosemide , 06/20/20 EF 60-65%, Bun/creat 14/0.56 08/22/2024             RLS takes Gabapentin , off Requip. Vit B12 818 03/25/23(taking Vit B12)             Seizure, saw neurology, presentation of left sided weakness again. EEG showed focal seizure right frontal region. Takes Keppra  since 07/23/20.             Hypothyroidism, taking Levothyroxine , TSH 1.72 03/30/24             Hyponatremia, stable, Na 1/30 to 08/22/2024, declined Nacl in the past.              Hypokalemia, K 4.3 08/22/2024, on potassium supplement             Anxiety,  stable, on  Lorazepam ,  failed Mirtazapine, Lexapro, Ambien .              GERD, stable, prn Omeprazole,  Hgb 11.7 on 06/30/2024             Constipation, takes MiraLax , Senokot S             OA, left hip, s/p total left hip, multiple sites, takes Tylenol , Aleve , Gabapentin  for pain in toes, failed Methocarbamol  and Meloxicam  recommended by Ortho-felt wobbly.              OP off Boniva, Ca, Vit D, declined DEXA, Vit D 16 10/07/21.              TIA, x2, off  Atorvastatin  per patient's request, on ASA              Urinary frequency, prolapsed bladder, chronic,  declined Myrbetriq 2/2 to the cost.   Past Medical History:  Diagnosis Date   Abdominal pain 02/26/2017   Anxiety    Arthritis    Left Knee, Wrist, Back and Hips   Cervical vertebral fusion    Diverticulitis    Diverticulosis  Fibromyalgia    GERD (gastroesophageal reflux disease)    Gout 02/2018   L hand   Hypertension     after d/c from hospital no current problems or medication   Hypothyroidism    Pelvis fracture (HCC)    Peritoneal free air 03/29/2012   Pneumonia    Pneumoperitoneum 02/26/2017   Pre-diabetes    Seizure (HCC)    Thyroid  disorder    Toe pain 09/06/2016   Past Surgical History:  Procedure Laterality Date   ABDOMINAL HYSTERECTOMY     BOWEL RESECTION N/A 07/09/2017   Procedure: SMALL BOWEL RESECTION;  Surgeon: Gladis Cough, MD;  Location: [facility];  Service: General;  Laterality: N/A;   CERVICAL FUSION     x2   CONVERSION TO TOTAL HIP Left 10/31/2020   Procedure: CONVERSION TO ANTERIOR TOTAL HIP;  Surgeon: Ernie Cough, MD;  Location: [facility];  Service: Orthopedics;  Laterality: Left;  2 hrs   FEMUR IM NAIL Left 03/03/2020   Procedure: INTRAMEDULLARY (IM) NAIL FEMORAL;  Surgeon: Ernie Cough, MD;  Location: [facility];  Service: Orthopedics;  Laterality: Left;   LAPAROSCOPIC APPENDECTOMY N/A 03/04/2017   Procedure: APPENDECTOMY LAPAROSCOPIC;  Surgeon: Cough Gladis, MD;  Location: [facility];  Service: General;   Laterality: N/A;   LAPAROSCOPY N/A 03/04/2017   Procedure: LAPAROSCOPY DIAGNOSTIC, ENTEROLYSIS;  Surgeon: Cough Gladis, MD;  Location: [facility];  Service: General;  Laterality: N/A;   LAPAROTOMY N/A 07/09/2017   Procedure: EXPLORATORY LAPAROTOMY;  Surgeon: Gladis Cough, MD;  Location: [facility];  Service: General;  Laterality: N/A;   SPINE SURGERY     Lumbar- rod placement   TONSILLECTOMY     89y/o   TOTAL VAGINAL HYSTERECTOMY      Allergies  Allergen Reactions   Codeine Rash   Penicillins Rash    Has patient had a PCN reaction causing immediate rash, facial/tongue/throat swelling, SOB or lightheadedness with hypotension: No Has patient had a PCN reaction causing severe rash involving mucus membranes or skin necrosis: No Has patient had a PCN reaction that required hospitalization: No Has patient had a PCN reaction occurring within the last 10 years: No If all of the above answers are NO, then may proceed with Cephalosporin use.  Tolerated Cephalosporin 10/31/20     Levaquin [Levofloxacin] Other (See Comments)    Allergic, per MAR   Tuberculin Tests Other (See Comments)    Allergic, per MAR   Tuberculin, Ppd Other (See Comments)    Allergic, per MAR    Allergies as of 08/23/2024       Reactions   Codeine Rash   Penicillins Rash   Has patient had a PCN reaction causing immediate rash, facial/tongue/throat swelling, SOB or lightheadedness with hypotension: No Has patient had a PCN reaction causing severe rash involving mucus membranes or skin necrosis: No Has patient had a PCN reaction that required hospitalization: No Has patient had a PCN reaction occurring within the last 10 years: No If all of the above answers are NO, then may proceed with Cephalosporin use. Tolerated Cephalosporin 10/31/20   Levaquin [levofloxacin] Other (See Comments)   Allergic, per MAR   Tuberculin Tests Other (See Comments)   Allergic, per MAR   Tuberculin, Ppd Other (See Comments)    Allergic, per [facility]        Medication List        Accurate as of August 23, 2024 11:59 PM. If you have any questions, ask your nurse or doctor.  acetaminophen  500 MG tablet Commonly known as: TYLENOL  Take 1,000 mg by mouth 2 (two) times daily as needed (for pain).   aspirin  EC 81 MG tablet Take 81 mg by mouth at bedtime. Swallow whole.   cyanocobalamin 1000 MCG tablet Commonly known as: VITAMIN B12 Take 1,000 mcg by mouth daily.   furosemide  20 MG tablet Commonly known as: LASIX  Take 20 mg by mouth every other day.   gabapentin  100 MG capsule Commonly known as: NEURONTIN  Take 1 capsule (100 mg total) by mouth at bedtime.   levETIRAcetam  500 MG 24 hr tablet Commonly known as: Keppra  XR Take 1 tablet (500 mg total) by mouth daily. What changed: when to take this   levothyroxine  75 MCG tablet Commonly known as: SYNTHROID  Take 1 tablet (75 mcg total) by mouth daily before breakfast.   LORazepam  0.5 MG tablet Commonly known as: ATIVAN  Take 0.5 mg by mouth every 8 (eight) hours as needed for anxiety.   Melatonin 5 MG Chew Chew 5 mg by mouth at bedtime.   meloxicam  7.5 MG tablet Commonly known as: MOBIC  Take 1 tablet (7.5 mg total) by mouth daily.   Multivitamin Tabs Take 1 tablet by mouth daily with breakfast.   mupirocin  ointment 2 % Commonly known as: BACTROBAN  Apply 1 Application topically as needed (for irritation- affected areas as directed). Apply to irritated area topically as needed for irritation.   omeprazole 20 MG capsule Commonly known as: PRILOSEC Take 20 mg by mouth daily as needed (for indigestion/upset stomach).   OXYGEN Inhale 2 L/min into the lungs as needed (TO KEEP SATS AT >90%).   Oyster Shell 500 MG Tabs Take 500 mg by mouth daily.   Pataday 0.7 % Soln Generic drug: Olopatadine HCl Place 1 drop into both eyes as needed (for dryness).   polyethylene glycol 17 g packet Commonly known as: MIRALAX  / GLYCOLAX  Take 17 g  by mouth daily as needed for mild constipation.   potassium chloride  10 MEQ tablet Commonly known as: KLOR-CON  Take 10 mEq by mouth once. Every other day   senna 8.6 MG tablet Commonly known as: SENOKOT Take 1 tablet by mouth daily.   sennosides-docusate sodium  8.6-50 MG tablet Commonly known as: SENOKOT-S Take 1-2 tablets by mouth daily as needed for constipation.   sodium chloride  1 g tablet Take 1 tablet (1 g total) by mouth every other day.   sodium fluoride  1.1 % Gel dental gel Commonly known as: FLUORISHIELD Place 1 Application onto teeth in the morning.   zolpidem  5 MG tablet Commonly known as: AMBIEN  Take 5 mg by mouth at bedtime as needed.        Review of Systems  Constitutional:  Positive for appetite change and unexpected weight change. Negative for fatigue and fever.       Gradual weight loss  HENT:  Positive for hearing loss. Negative for congestion and trouble swallowing.   Eyes:  Negative for visual disturbance.  Respiratory:  Positive for shortness of breath. Negative for cough, choking, chest tightness and wheezing.        DOE  Cardiovascular:  Positive for leg swelling. Negative for chest pain and palpitations.  Gastrointestinal:  Negative for abdominal pain and constipation.  Genitourinary:  Positive for frequency. Negative for dysuria and urgency.       Bathroom trips 3-4x/night  Musculoskeletal:  Positive for arthralgias, back pain and gait problem.       Left hip pain, s/p total left hip arthroplasty. Multiple sites aches in  am, better as day goes by. Pain in toes.  Chronic back pain  Skin:  Negative for color change.  Neurological:  Positive for tremors. Negative for seizures, weakness and headaches.       Restless legs at night.  Memory lapses.   Psychiatric/Behavioral:  Positive for sleep disturbance. Negative for behavioral problems. The patient is not nervous/anxious.     Immunization History  Administered Date(s) Administered   Fluzone  Influenza virus vaccine,trivalent (IIV3), split virus 08/11/2018, 08/03/2019   INFLUENZA, HIGH DOSE SEASONAL PF 09/01/2023   Influenza-Unspecified 09/01/2018, 09/10/2020, 09/18/2021, 09/21/2022   Moderna Covid-19 Fall Seasonal Vaccine 56yrs & older 09/15/2023   Moderna Sars-Covid-2 Vaccination 12/04/2019, 01/01/2020   Pfizer Covid-19 Vaccine Bivalent Booster 18yrs & up 10/01/2022   Pneumococcal Conjugate-13 05/29/2014   Pneumococcal Polysaccharide-23 06/14/2015   Tdap 09/14/2012   Zoster Recombinant(Shingrix) 07/05/2018, 02/10/2022, 05/11/2022   Zoster, Live 04/30/2004, 04/13/2018, 07/05/2018   Pertinent  Health Maintenance Due  Topic Date Due   DEXA SCAN  Never done   Influenza Vaccine  06/30/2024      11/04/2020   12:00 PM 04/23/2021   11:34 AM 12/08/2022    9:52 AM 12/11/2022    2:50 PM 05/19/2023    9:23 AM  Fall Risk  Falls in the past year?   0 0 1  Was there an injury with Fall?   0 0 1  Fall Risk Category Calculator   0 0 2  Fall Risk Category (Retired)   Low  Low    (RETIRED) Patient Fall Risk Level High fall risk  Moderate fall risk  Low fall risk  Low fall risk    Patient at Risk for Falls Due to   No Fall Risks No Fall Risks History of fall(s);Impaired balance/gait;Impaired mobility  Fall risk Follow up   Falls evaluation completed  Falls evaluation completed  Falls evaluation completed     Data saved with a previous flowsheet row definition   Functional Status Survey:    Vitals:   08/23/24 1150  BP: (!) 142/81  Pulse: 83  Resp: 18  Temp: (!) 97 F (36.1 C)  SpO2: 92%  Weight: 98 lb 11.2 oz (44.8 kg)   Body mass index is 21.36 kg/m. Physical Exam Constitutional:      Comments: frail  HENT:     Head: Normocephalic and atraumatic.     Mouth/Throat:     Mouth: Mucous membranes are moist.  Eyes:     Extraocular Movements: Extraocular movements intact.     Pupils: Pupils are equal, round, and reactive to light.  Cardiovascular:     Rate and Rhythm: Normal  rate and regular rhythm.     Heart sounds: No murmur heard.    Comments: Weak left DP pulse, more symptomatic tingling sensation at night.  Pulmonary:     Effort: Pulmonary effort is normal.     Breath sounds: Rales present.     Comments: Fine rales R lung base Abdominal:     General: Bowel sounds are normal.     Palpations: Abdomen is soft.     Tenderness: There is no abdominal tenderness.  Musculoskeletal:     Cervical back: Normal range of motion and neck supple.     Right lower leg: Edema present.     Left lower leg: Edema present.     Comments: mild scoliosis. Hx of back surgeries x3 with left sciatica pain. s/p total left hip replacement. R SIJ pain, positional, palpated, still able to bear  weight, walking BLE edema trace    Skin:    General: Skin is warm and dry.  Neurological:     General: No focal deficit present.     Mental Status: She is alert and oriented to person, place, and time. Mental status is at baseline.     Motor: No weakness.     Gait: Gait abnormal.     Comments: Intentional tremor in hands, difficulty feeding self.   Psychiatric:        Mood and Affect: Mood normal.        Behavior: Behavior normal.        Thought Content: Thought content normal.        Judgment: Judgment normal.     Labs reviewed: Recent Labs    06/02/24 1729 06/20/24 0000 06/29/24 1037 06/30/24 0429 07/13/24 1459  NA 134*   < > 132* 127* 132*  K 3.8   < > 3.6 3.2* 4.8  CL 97*   < > 98 100 99  CO2 24   < > 21* 24 26*  GLUCOSE 118*  --  133* 97  --   BUN 16   < > 12 7* 10  CREATININE 0.93   < > 0.52 0.45 0.6  CALCIUM  10.0   < > 9.2 8.6* 9.8   < > = values in this interval not displayed.   Recent Labs    05/11/24 0000 06/20/24 0000 06/29/24 1037  AST 17 17 25   ALT 10 9 14   ALKPHOS 43 67 61  BILITOT  --   --  0.9  PROT  --   --  8.0  ALBUMIN  3.7 4.4 4.3   Recent Labs    05/11/24 0000 06/02/24 1729 06/20/24 0000 06/29/24 1037 06/30/24 0429  WBC 6.1 8.9 13.3  8.1 8.2  NEUTROABS 3,569.00  --  10,800.00 7.1  --   HGB 11.8* 12.9 12.6 12.9 11.7*  HCT 36 38.5 37 39.9 35.5*  MCV  --  97.0  --  96.4 98.3  PLT 441* 385 457* 550* 342   Lab Results  Component Value Date   TSH 0.931 06/29/2024   Lab Results  Component Value Date   HGBA1C 5.8 (H) 10/21/2020   Lab Results  Component Value Date   CHOL 171 06/20/2020   HDL 59 06/20/2020   LDLCALC 94 06/20/2020   TRIG 89 06/20/2020   CHOLHDL 2.9 06/20/2020    Significant Diagnostic Results in last 30 days:  No results found.  Assessment/Plan: Tremor of both hands The patient declined neurology referral, consented to try propranolol 10 mg twice daily  Failure to thrive (child) Generalized frailty, wheelchair for mobility  Failure to thrive, supportive care in SNF [facility]  Edema, peripheral  taking  Furosemide , 06/20/20 EF 60-65%, Bun/creat 14/0.56 08/22/2024  Restless leg syndrome takes Gabapentin , off Requip. Vit B12 818 03/25/23(taking Vit B12)  Seizures (HCC) No active seizure,  saw neurology, presentation of left sided weakness again. EEG showed focal seizure right frontal region. Takes Keppra  since 07/23/20.  Hypothyroidism taking Levothyroxine , TSH 1.72 03/30/24  Hyponatremia stable, Na 1/30 to 08/22/2024, declined Nacl in the past  Hypokalemia K 4.3 08/22/2024, on potassium supplement  Insomnia secondary to anxiety stable, on  Lorazepam ,  failed Mirtazapine, Lexapro, Ambien .   GERD (gastroesophageal reflux disease) stable, prn Omeprazole,  Hgb 11.7 on 06/30/2024  Slow transit constipation Stable,  takes MiraLax , Senokot S  Osteoarthritis, multiple sites Uses w/c for mobility.   HTN (hypertension), benign Mild elevated  systolic blood pressure, adding propranolol for tremors in hands will benefit her blood pressure control    Family/ staff Communication: Plan of care reviewed with the patient and charge nurse  Labs/tests ordered: None

## 2024-08-23 NOTE — Assessment & Plan Note (Signed)
 stable, on  Lorazepam ,  failed Mirtazapine, Lexapro, Ambien .

## 2024-08-23 NOTE — Assessment & Plan Note (Signed)
Uses w/c for mobility.

## 2024-08-29 ENCOUNTER — Emergency Department (HOSPITAL_COMMUNITY)

## 2024-08-29 ENCOUNTER — Encounter (HOSPITAL_COMMUNITY): Payer: Self-pay

## 2024-08-29 ENCOUNTER — Other Ambulatory Visit: Payer: Self-pay

## 2024-08-29 ENCOUNTER — Emergency Department (HOSPITAL_COMMUNITY)
Admission: EM | Admit: 2024-08-29 | Discharge: 2024-08-30 | Disposition: A | Attending: Emergency Medicine | Admitting: Emergency Medicine

## 2024-08-29 DIAGNOSIS — Z7982 Long term (current) use of aspirin: Secondary | ICD-10-CM | POA: Insufficient documentation

## 2024-08-29 DIAGNOSIS — S0083XA Contusion of other part of head, initial encounter: Secondary | ICD-10-CM | POA: Diagnosis not present

## 2024-08-29 DIAGNOSIS — I10 Essential (primary) hypertension: Secondary | ICD-10-CM | POA: Insufficient documentation

## 2024-08-29 DIAGNOSIS — S0990XA Unspecified injury of head, initial encounter: Secondary | ICD-10-CM | POA: Diagnosis present

## 2024-08-29 DIAGNOSIS — Y92009 Unspecified place in unspecified non-institutional (private) residence as the place of occurrence of the external cause: Secondary | ICD-10-CM | POA: Diagnosis not present

## 2024-08-29 DIAGNOSIS — Z79899 Other long term (current) drug therapy: Secondary | ICD-10-CM | POA: Diagnosis not present

## 2024-08-29 DIAGNOSIS — E039 Hypothyroidism, unspecified: Secondary | ICD-10-CM | POA: Diagnosis not present

## 2024-08-29 DIAGNOSIS — W19XXXA Unspecified fall, initial encounter: Secondary | ICD-10-CM | POA: Diagnosis not present

## 2024-08-29 DIAGNOSIS — R569 Unspecified convulsions: Secondary | ICD-10-CM

## 2024-08-29 LAB — COMPREHENSIVE METABOLIC PANEL WITH GFR
ALT: 30 U/L (ref 0–44)
AST: 33 U/L (ref 15–41)
Albumin: 4.2 g/dL (ref 3.5–5.0)
Alkaline Phosphatase: 85 U/L (ref 38–126)
Anion gap: 13 (ref 5–15)
BUN: 17 mg/dL (ref 8–23)
CO2: 23 mmol/L (ref 22–32)
Calcium: 10 mg/dL (ref 8.9–10.3)
Chloride: 95 mmol/L — ABNORMAL LOW (ref 98–111)
Creatinine, Ser: 0.72 mg/dL (ref 0.44–1.00)
GFR, Estimated: >60 mL/min (ref >60)
Glucose, Bld: 130 mg/dL — ABNORMAL HIGH (ref 70–99)
Potassium: 4 mmol/L (ref 3.5–5.1)
Sodium: 131 mmol/L — ABNORMAL LOW (ref 135–145)
Total Bilirubin: 0.8 mg/dL (ref 0.0–1.2)
Total Protein: 7.3 g/dL (ref 6.5–8.1)

## 2024-08-29 LAB — URINALYSIS, ROUTINE W REFLEX MICROSCOPIC
Bilirubin Urine: NEGATIVE
Glucose, UA: NEGATIVE mg/dL
Hgb urine dipstick: NEGATIVE
Ketones, ur: NEGATIVE mg/dL
Leukocytes,Ua: NEGATIVE
Nitrite: NEGATIVE
Protein, ur: NEGATIVE mg/dL
Specific Gravity, Urine: 1.01 (ref 1.005–1.030)
pH: 7 (ref 5.0–8.0)

## 2024-08-29 LAB — TROPONIN I (HIGH SENSITIVITY): Troponin I (High Sensitivity): 21 ng/L — ABNORMAL HIGH (ref <18)

## 2024-08-29 LAB — CBC
HCT: 42.4 % (ref 36.0–46.0)
Hemoglobin: 14.3 g/dL (ref 12.0–15.0)
MCH: 32.6 pg (ref 26.0–34.0)
MCHC: 33.7 g/dL (ref 30.0–36.0)
MCV: 96.8 fL (ref 80.0–100.0)
Platelets: 314 K/uL (ref 150–400)
RBC: 4.38 MIL/uL (ref 3.87–5.11)
RDW: 12.4 % (ref 11.5–15.5)
WBC: 12 K/uL — ABNORMAL HIGH (ref 4.0–10.5)
nRBC: 0 % (ref 0.0–0.2)

## 2024-08-29 LAB — CBG MONITORING, ED: Glucose-Capillary: 122 mg/dL — ABNORMAL HIGH (ref 70–99)

## 2024-08-29 LAB — I-STAT CG4 LACTIC ACID, ED: Lactic Acid, Venous: 1.3 mmol/L (ref 0.5–1.9)

## 2024-08-29 MED ORDER — LACTATED RINGERS IV BOLUS
1000.0000 mL | Freq: Once | INTRAVENOUS | Status: AC
Start: 2024-08-29 — End: 2024-08-30
  Administered 2024-08-29: 1000 mL via INTRAVENOUS

## 2024-08-29 NOTE — ED Triage Notes (Signed)
 Found in facility paperwork pt is comfort measures. EMS reports son knows about transport to hospital but is not coming here.

## 2024-08-29 NOTE — ED Provider Notes (Addendum)
 [place] EMERGENCY DEPARTMENT AT [facility] Provider Note   CSN: [id number] Arrival date & time: 08/29/24  2140     Patient presents with: Fall and Altered Mental Status   Sara Davila is a 89 y.o. female.   This is a 89 year old female presenting emergency department for altered mental status.  Was found on the floor when facility was going to check on her.  Unknown last known normal.  Unclear how she ended up on the floor.  EMS did report that staff open the door and hit her with the door.  Was alert to person only and somnolent and route.  On my initial evaluation patient is able to tell me her name and that she is in [place], but is seemingly confused.  Denies any pain   Fall  Altered Mental Status      Prior to Admission medications   Medication Sig Start Date End Date Taking? Authorizing Provider  acetaminophen  (TYLENOL ) 500 MG tablet Take 1,000 mg by mouth 2 (two) times daily as needed (for pain).    [provider]  aspirin  EC 81 MG tablet Take 81 mg by mouth at bedtime. Swallow whole.    [provider]  cyanocobalamin (VITAMIN B12) 1000 MCG tablet Take 1,000 mcg by mouth daily.    [provider]  furosemide  (LASIX ) 20 MG tablet Take 20 mg by mouth every other day.    [provider]  gabapentin  (NEURONTIN ) 100 MG capsule Take 1 capsule (100 mg total) by mouth at bedtime. 04/27/22   Ines Onetha NOVAK, MD  levETIRAcetam  (KEPPRA  XR) 500 MG 24 hr tablet Take 1 tablet (500 mg total) by mouth daily. Patient taking differently: Take 500 mg by mouth in the morning. 04/27/22   Ines Onetha NOVAK, MD  levothyroxine  (SYNTHROID ) 75 MCG tablet Take 1 tablet (75 mcg total) by mouth daily before breakfast. 06/15/20   Charlanne Fredia CROME, MD  LORazepam  (ATIVAN ) 0.5 MG tablet Take 0.5 mg by mouth every 8 (eight) hours as needed for anxiety.    [provider]  Melatonin 5 MG CHEW Chew 5 mg by mouth at bedtime.    [provider]   meloxicam  (MOBIC ) 7.5 MG tablet Take 1 tablet (7.5 mg total) by mouth daily. 07/02/24   Briana Elgin LABOR, MD  Multiple Vitamin (MULTIVITAMIN) TABS Take 1 tablet by mouth daily with breakfast.    [provider]  mupirocin  ointment (BACTROBAN ) 2 % Apply 1 Application topically as needed (for irritation- affected areas as directed). Apply to irritated area topically as needed for irritation.    [provider]  omeprazole (PRILOSEC) 20 MG capsule Take 20 mg by mouth daily as needed (for indigestion/upset stomach).    [provider]  OXYGEN Inhale 2 L/min into the lungs as needed (TO KEEP SATS AT >90%).    [provider]  Oyster Shell 500 MG TABS Take 500 mg by mouth daily.    [provider]  PATADAY 0.7 % SOLN Place 1 drop into both eyes as needed (for dryness).    [provider]  polyethylene glycol (MIRALAX  / GLYCOLAX ) 17 g packet Take 17 g by mouth daily as needed for mild constipation.    [provider]  potassium chloride  (KLOR-CON ) 10 MEQ tablet Take 10 mEq by mouth once. Every other day 07/07/24   [provider]  senna (SENOKOT) 8.6 MG tablet Take 1 tablet by mouth daily.    [provider]  sennosides-docusate sodium  (SENOKOT-S) 8.6-50 MG tablet Take 1-2 tablets by mouth daily as needed for constipation.    [provider]  sodium chloride  1 g tablet Take 1 tablet (1 g total) by mouth every other day. 06/21/24   Fargo, Amy E, NP  sodium fluoride  (FLUORISHIELD) 1.1 % GEL dental gel Place 1 Application onto teeth in the morning.    [provider]  zolpidem  (AMBIEN ) 5 MG tablet Take 5 mg by mouth at bedtime as needed. 07/17/24   [provider]    Allergies: Codeine; Penicillins; Levaquin [levofloxacin]; Tuberculin tests; and Tuberculin, ppd    Review of Systems  Updated Vital Signs BP (!) 164/88   Pulse 99   Temp 98.7 F (37.1 C) (Oral)   Resp 19   Ht 4' 9 (1.448 m)   Wt 44.8  kg   SpO2 95%   BMI 21.37 kg/m   Physical Exam Vitals and nursing note reviewed.  HENT:     Head: Normocephalic.     Comments: Posterior hematoma    Nose: Nose normal.     Mouth/Throat:     Mouth: Mucous membranes are moist.  Eyes:     Conjunctiva/sclera: Conjunctivae normal.     Pupils: Pupils are equal, round, and reactive to light.  Cardiovascular:     Rate and Rhythm: Normal rate and regular rhythm.  Pulmonary:     Effort: Pulmonary effort is normal.     Breath sounds: Normal breath sounds.  Abdominal:     General: Abdomen is flat. There is no distension.     Palpations: Abdomen is soft.     Tenderness: There is no abdominal tenderness. There is no guarding or rebound.  Musculoskeletal:        General: No tenderness.     Right lower leg: No edema.     Left lower leg: No edema.  Skin:    General: Skin is warm and dry.     Capillary Refill: Capillary refill takes less than 2 seconds.  Neurological:     Mental Status: She is alert and oriented to person, place, and time.  Psychiatric:        Mood and Affect: Mood normal.        Behavior: Behavior normal.     (all labs ordered are listed, but only abnormal results are displayed) Labs Reviewed  CBC - Abnormal; Notable for the following components:      Result Value   WBC 12.0 (*)    All other components within normal limits  URINALYSIS, ROUTINE W REFLEX MICROSCOPIC - Abnormal; Notable for the following components:   APPearance CLOUDY (*)    All other components within normal limits  CBG MONITORING, ED - Abnormal; Notable for the following components:   Glucose-Capillary 122 (*)    All other components within normal limits  TROPONIN I (HIGH SENSITIVITY) - Abnormal; Notable for the following components:   Troponin I (High Sensitivity) 21 (*)    All other components within normal limits  COMPREHENSIVE METABOLIC PANEL WITH GFR  CBG MONITORING, ED  I-STAT CG4 LACTIC ACID, ED  TROPONIN I (HIGH SENSITIVITY)     EKG: EKG Interpretation Date/Time:  Tuesday August 29 2024 21:53:57 EDT Ventricular Rate:  90 PR Interval:  184 QRS Duration:  82 QT Interval:  341 QTC Calculation: 418 R Axis:   65  Text Interpretation: Sinus tachycardia Atrial premature complexes Biatrial enlargement Nonspecific T abnrm, anterolateral leads Artifact in lead(s) I II III aVR aVL aVF  V2 Confirmed by Neysa Clap [phone]) on 08/29/2024 10:29:59 PM  Radiology: CT Head Wo Contrast Result Date: 08/29/2024 CLINICAL DATA:  Mental status change, unknown cause EXAM: CT HEAD WITHOUT CONTRAST TECHNIQUE: Contiguous axial images were obtained from the base of the skull through the vertex without intravenous contrast. RADIATION DOSE REDUCTION: This exam was performed according to the departmental dose-optimization program which includes automated exposure control, adjustment of the mA and/or kV according to patient size and/or use of iterative reconstruction technique. COMPARISON:  Head CT 06/02/2024 FINDINGS: Brain: No intracranial hemorrhage, mass effect, or midline shift. Stable degree of atrophy and chronic small vessel ischemia. Remote lacunar infarct in the right caudate. No hydrocephalus. The basilar cisterns are patent. No evidence of territorial infarct or acute ischemia. No extra-axial or intracranial fluid collection. Vascular: Atherosclerosis of skullbase vasculature without hyperdense vessel or abnormal calcification. Skull: No fracture or focal lesion. Sinuses/Orbits: No acute finding. Other: None. IMPRESSION: 1. No acute intracranial abnormality. 2. Stable atrophy and chronic small vessel ischemia. Electronically Signed   By: Andrea Gasman M.D.   On: 08/29/2024 23:34   DG Chest Portable 1 View Result Date: 08/29/2024 CLINICAL DATA:  Fall EXAM: PORTABLE CHEST 1 VIEW COMPARISON:  06/29/2024 FINDINGS: Hardware in the cervical spine. Evidence of prior granulomatous disease. Stable cardiomediastinal silhouette. No  pneumothorax IMPRESSION: No active disease. Evidence of prior granulomatous disease. Electronically Signed   By: Luke Bun M.D.   On: 08/29/2024 22:22     Procedures   Medications Ordered in the ED  lactated ringers  bolus 1,000 mL (1,000 mLs Intravenous New Bag/Given 08/29/24 2317)    Clinical Course as of 08/29/24 2349  Tue Aug 29, 2024  2202 I was able to reach patient's son who was able to help clarify goals of care and noted they would like basic things checked, but no heroic interventions.  Antibiotics if indicated.  Patient to remain DNR DNI. [TY]  2233 Was able to reach patient's friend Tanda who is actually her POA and he agreed with patient's son and clarified that patient would be okay with IV fluids and antibiotics.  And also confirm the patient is a DNR/DNI and does not want particularly aerobic interventions. [TY]  2252 Patient's mentation is seemingly improving.  I suspect she had a breakthrough seizure.  She is following commands currently.  No localizing neurodeficit. [TY]    Clinical Course User Index [TY] Neysa Clap PARAS, DO                                 Medical Decision Making This is a 89 year old female complex past medical history to include seizures, hypertension, prediabetes, GERD, hypothyroid, fibromyalgia presenting to the emergency department for altered mental status.  She does present with a DNR form and advanced directives stating the patient is essentially comfort care and no IV fluids, but can have antibiotics some some circumstances.  Was able to talk with family members regarding goals of care; see ED course.  I suspect patient had seizure as she does not have any localizing deficits on exam and her mentation is slowly improving.  I do not appreciate obvious abnormalities on her CT head per my independent review, but radiology review is pending.  Chest x-ray without pneumonia pneumothorax.  Has no obvious signs of trauma other than the hematoma to her  posterior head.  Minor leukocytosis, family members do note that she has problems with recurrent UTIs however  her UA today without evidence of UTI.  Her lactate is normal.  EKG appears to be sinus rhythm, without ischemic changes.  Initial troponin with mild elevation will need delta troponin.  Receiving IV fluids.  Care signed out to overnight team.  Disposition pending further workup and reevaluation of patient.  Amount and/or Complexity of Data Reviewed Independent Historian: EMS    Details: Stable vitals and route.  Provided most of HPI as patient does not recall what happened External Data Reviewed:     Details: Does not appear to be taking a blood thinner.  Does appear that she has Ativan  prescribed to her as well as Ambien .  These may also be the cause of her altered mental status. Labs: ordered. Decision-making details documented in ED Course. Radiology: ordered and independent interpretation performed. ECG/medicine tests: independent interpretation performed.  Risk Decision regarding hospitalization. Decision not to resuscitate or to de-escalate care because of poor prognosis. Diagnosis or treatment significantly limited by social determinants of health. Risk Details: Lives at a facility       Final diagnoses:  None    ED Discharge Orders     None          Neysa Caron PARAS, DO 08/29/24 2348    Neysa Caron PARAS, DO 08/29/24 2349

## 2024-08-29 NOTE — ED Triage Notes (Signed)
 Pt arrives with EMS for unwitnessed fall at [facility]. Unknown LOC, no thinners, injuries to hematoma to back R head. Facility found her on the floor in which accidentally hitting her with the door. Pt reports a little bit of pain and asked where I dont really No documented history of dementia.  Baseline Aox4, current aox1- only to self 200/90 SR 90  CBG 125 94% RA  22g L FA

## 2024-08-30 ENCOUNTER — Non-Acute Institutional Stay (SKILLED_NURSING_FACILITY): Admitting: Nurse Practitioner

## 2024-08-30 ENCOUNTER — Encounter: Payer: Self-pay | Admitting: Nurse Practitioner

## 2024-08-30 DIAGNOSIS — R600 Localized edema: Secondary | ICD-10-CM

## 2024-08-30 DIAGNOSIS — I10 Essential (primary) hypertension: Secondary | ICD-10-CM | POA: Diagnosis not present

## 2024-08-30 DIAGNOSIS — K219 Gastro-esophageal reflux disease without esophagitis: Secondary | ICD-10-CM | POA: Diagnosis not present

## 2024-08-30 DIAGNOSIS — E039 Hypothyroidism, unspecified: Secondary | ICD-10-CM

## 2024-08-30 DIAGNOSIS — M150 Primary generalized (osteo)arthritis: Secondary | ICD-10-CM | POA: Diagnosis not present

## 2024-08-30 DIAGNOSIS — K5901 Slow transit constipation: Secondary | ICD-10-CM | POA: Diagnosis not present

## 2024-08-30 DIAGNOSIS — I639 Cerebral infarction, unspecified: Secondary | ICD-10-CM

## 2024-08-30 DIAGNOSIS — E871 Hypo-osmolality and hyponatremia: Secondary | ICD-10-CM

## 2024-08-30 DIAGNOSIS — F419 Anxiety disorder, unspecified: Secondary | ICD-10-CM

## 2024-08-30 DIAGNOSIS — R251 Tremor, unspecified: Secondary | ICD-10-CM

## 2024-08-30 DIAGNOSIS — R569 Unspecified convulsions: Secondary | ICD-10-CM

## 2024-08-30 DIAGNOSIS — F5105 Insomnia due to other mental disorder: Secondary | ICD-10-CM

## 2024-08-30 LAB — TROPONIN I (HIGH SENSITIVITY): Troponin I (High Sensitivity): 23 ng/L — ABNORMAL HIGH (ref <18)

## 2024-08-30 NOTE — Assessment & Plan Note (Signed)
 Permissive blood pressure control

## 2024-08-30 NOTE — ED Notes (Signed)
 Called [facility] to give report. phone was answered by Signe and transferred to [facility] but there was no answer. Left VM with callback number

## 2024-08-30 NOTE — Progress Notes (Signed)
 Location:  [facility] Room Number: 22 A Place of Service:  SNF (31) Provider:  Meagan Ancona X, NP   Patient Care Team: Sherlynn Madden, MD as PCP - General (Internal Medicine)  Extended Emergency Contact Information Primary Emergency Contact: Hertzwig,Kevin Address: [address]          [address]          [place], [place] [address] [place] Home Phone: [phone] Mobile Phone: [phone] Relation: Son Secondary Emergency Contact: Hertzwig,Eric Address: [address]          [place] , [place] [address] [place] Home Phone: [phone] Mobile Phone: [phone] Relation: Son  Code Status:  DNR Goals of care: Advanced Directive information    08/29/2024    9:51 PM  Advanced Directives  Does Patient Have a Medical Advance Directive? Yes  Type of Advance Directive Out of facility DNR (pink MOST or yellow form)     Chief Complaint  Patient presents with   Follow-up    Follow up ED    HPI:  Pt is a 89 y.o. female seen today for an acute visit for ED eval 08/29/24 for fall suspected the cause of an active seizure activity, no apparent injury noted.    Generalized frailty, wheelchair for mobility, CXR, CT head/cervical spine, maxillofacial, EKG, CBC/diff, CMP, UA unremarkable.              Failure to thrive, supportive care in SN[facility]             Edema BLE, taking  Furosemide , 06/20/20 EF 60-65%  Tremor in hands, much better since Propranolol.              RLS takes Gabapentin , off Requip. Vit B12 818 03/25/23(taking Vit B12)             Seizure, saw neurology, hx of recurrent the left sided weakness. EEG showed focal seizure right frontal region. Takes Keppra  since 07/23/20.             Hypothyroidism, taking Levothyroxine , TSH 1.72 03/30/24             Hyponatremia, stable, Na 131 08/29/24, declined Nacl in the past.              Hypokalemia, K 4.0 08/29/24, on potassium supplement             Anxiety, stable, on   Lorazepam ,  failed Mirtazapine, Lexapro, Ambien .              GERD, stable, prn Omeprazole,  Hgb 14.3 08/29/24             Constipation, takes MiraLax , Senokot S             OA, left hip, s/p total left hip, multiple sites, takes Tylenol , Aleve , Gabapentin  for pain in toes, failed Methocarbamol  and Meloxicam  recommended by Ortho-felt wobbly.              OP off Boniva, Ca, Vit D, declined DEXA, Vit D 16 10/07/21.              TIA, x2, off  Atorvastatin  per patient's request, on ASA              Urinary frequency, prolapsed bladder, chronic,  declined Myrbetriq 2/2 to the cost.     Past Medical History:  Diagnosis Date   Abdominal pain 02/26/2017   Anxiety    Arthritis    Left Knee,  Wrist, Back and Hips   Cervical vertebral fusion    Diverticulitis    Diverticulosis    Fibromyalgia    GERD (gastroesophageal reflux disease)    Gout 02/2018   L hand   Hypertension     after d/c from hospital no current problems or medication   Hypothyroidism    Pelvis fracture (HCC)    Peritoneal free air 03/29/2012   Pneumonia    Pneumoperitoneum 02/26/2017   Pre-diabetes    Seizure (HCC)    Thyroid  disorder    Toe pain 09/06/2016   Past Surgical History:  Procedure Laterality Date   ABDOMINAL HYSTERECTOMY     BOWEL RESECTION N/A 07/09/2017   Procedure: SMALL BOWEL RESECTION;  Surgeon: Gladis Cough, MD;  Location: [facility];  Service: General;  Laterality: N/A;   CERVICAL FUSION     x2   CONVERSION TO TOTAL HIP Left 10/31/2020   Procedure: CONVERSION TO ANTERIOR TOTAL HIP;  Surgeon: Ernie Cough, MD;  Location: [facility];  Service: Orthopedics;  Laterality: Left;  2 hrs   FEMUR IM NAIL Left 03/03/2020   Procedure: INTRAMEDULLARY (IM) NAIL FEMORAL;  Surgeon: Ernie Cough, MD;  Location: [facility];  Service: Orthopedics;  Laterality: Left;   LAPAROSCOPIC APPENDECTOMY N/A 03/04/2017   Procedure: APPENDECTOMY LAPAROSCOPIC;  Surgeon: Cough Gladis, MD;  Location: [facility];  Service: General;  Laterality: N/A;    LAPAROSCOPY N/A 03/04/2017   Procedure: LAPAROSCOPY DIAGNOSTIC, ENTEROLYSIS;  Surgeon: Cough Gladis, MD;  Location: [facility];  Service: General;  Laterality: N/A;   LAPAROTOMY N/A 07/09/2017   Procedure: EXPLORATORY LAPAROTOMY;  Surgeon: Gladis Cough, MD;  Location: [facility];  Service: General;  Laterality: N/A;   SPINE SURGERY     Lumbar- rod placement   TONSILLECTOMY     89y/o   TOTAL VAGINAL HYSTERECTOMY      Allergies  Allergen Reactions   Codeine Rash   Penicillins Rash    Has patient had a PCN reaction causing immediate rash, facial/tongue/throat swelling, SOB or lightheadedness with hypotension: No Has patient had a PCN reaction causing severe rash involving mucus membranes or skin necrosis: No Has patient had a PCN reaction that required hospitalization: No Has patient had a PCN reaction occurring within the last 10 years: No If all of the above answers are NO, then may proceed with Cephalosporin use.  Tolerated Cephalosporin 10/31/20     Levaquin [Levofloxacin] Other (See Comments)    Allergic, per [facility]   Tuberculin Tests Other (See Comments)    Allergic, per [facility]   Tuberculin, Ppd Other (See Comments)    Allergic, per [facility]    Outpatient Encounter Medications as of 08/30/2024  Medication Sig   acetaminophen  (TYLENOL ) 500 MG tablet Take 1,000 mg by mouth 2 (two) times daily as needed (for pain).   aspirin  EC 81 MG tablet Take 81 mg by mouth at bedtime. Swallow whole.   cyanocobalamin (VITAMIN B12) 1000 MCG tablet Take 1,000 mcg by mouth daily.   furosemide  (LASIX ) 20 MG tablet Take 20 mg by mouth every other day.   gabapentin  (NEURONTIN ) 100 MG capsule Take 1 capsule (100 mg total) by mouth at bedtime.   levETIRAcetam  (KEPPRA  XR) 500 MG 24 hr tablet Take 1 tablet (500 mg total) by mouth daily.   levothyroxine  (SYNTHROID ) 75 MCG tablet Take 1 tablet (75 mcg total) by mouth daily before breakfast.   LORazepam  (ATIVAN ) 0.5 MG tablet Take 0.5 mg by mouth at bedtime.   Give 0.5 mg by mouth at  bedtime related to ANXIETY DISORDER, UNSPECIFIED (F41.9)   Melatonin 5 MG CHEW Chew 5 mg by mouth at bedtime.   meloxicam  (MOBIC ) 7.5 MG tablet Take 1 tablet (7.5 mg total) by mouth daily.   Multiple Vitamin (MULTIVITAMIN) TABS Take 1 tablet by mouth daily with breakfast.   omeprazole (PRILOSEC) 20 MG capsule Take 20 mg by mouth daily as needed (for indigestion/upset stomach).   OXYGEN Inhale 2 L/min into the lungs as needed (TO KEEP SATS AT >90%).   Oyster Shell 500 MG TABS Take 500 mg by mouth daily.   polyethylene glycol (MIRALAX  / GLYCOLAX ) 17 g packet Take 17 g by mouth daily as needed for mild constipation.   potassium chloride  (KLOR-CON ) 10 MEQ tablet Take 10 mEq by mouth once. Every other day   propranolol (INDERAL) 10 MG tablet Take 10 mg by mouth 2 (two) times daily.   sennosides-docusate sodium  (SENOKOT-S) 8.6-50 MG tablet Take 1-2 tablets by mouth daily as needed for constipation.   sodium fluoride  (FLUORISHIELD) 1.1 % GEL dental gel Place 1 Application onto teeth in the morning.   mupirocin  ointment (BACTROBAN ) 2 % Apply 1 Application topically as needed (for irritation- affected areas as directed). Apply to irritated area topically as needed for irritation. (Patient not taking: Reported on 08/30/2024)   PATADAY 0.7 % SOLN Place 1 drop into both eyes as needed (for dryness). (Patient not taking: Reported on 08/30/2024)   senna (SENOKOT) 8.6 MG tablet Take 1 tablet by mouth daily. (Patient not taking: Reported on 08/30/2024)   sodium chloride  1 g tablet Take 1 tablet (1 g total) by mouth every other day. (Patient not taking: Reported on 08/30/2024)   zolpidem  (AMBIEN ) 5 MG tablet Take 5 mg by mouth at bedtime as needed. (Patient not taking: Reported on 08/30/2024)   No facility-administered encounter medications on file as of 08/30/2024.    Review of Systems  Constitutional:  Positive for appetite change and fatigue. Negative for fever.       Gradual weight loss   HENT:  Positive for hearing loss. Negative for congestion and trouble swallowing.   Eyes:  Negative for visual disturbance.  Respiratory:  Positive for shortness of breath. Negative for cough, choking, chest tightness and wheezing.        DOE  Cardiovascular:  Positive for leg swelling. Negative for chest pain and palpitations.  Gastrointestinal:  Negative for abdominal pain and constipation.  Genitourinary:  Positive for frequency. Negative for dysuria and urgency.       Bathroom trips 3-4x/night  Musculoskeletal:  Positive for arthralgias, back pain and gait problem.       Left hip pain, s/p total left hip arthroplasty. Multiple sites aches in am, better as day goes by. Pain in toes.  Chronic back pain  Skin:  Negative for color change.  Neurological:  Negative for tremors, seizures, weakness and headaches.       Restless legs at night.  Memory lapses.   Psychiatric/Behavioral:  Positive for sleep disturbance. Negative for behavioral problems. The patient is not nervous/anxious.     Immunization History  Administered Date(s) Administered   Fluzone Influenza virus vaccine,trivalent (IIV3), split virus 08/11/2018, 08/03/2019   INFLUENZA, HIGH DOSE SEASONAL PF 09/01/2023   Influenza-Unspecified 09/01/2018, 09/10/2020, 09/18/2021, 09/21/2022   Moderna Covid-19 Fall Seasonal Vaccine 75yrs & older 09/15/2023   Moderna Sars-Covid-2 Vaccination 12/04/2019, 01/01/2020   Pfizer Covid-19 Vaccine Bivalent Booster 61yrs & up 10/01/2022   Pneumococcal Conjugate-13 05/29/2014   Pneumococcal Polysaccharide-23 06/14/2015  Tdap 09/14/2012   Zoster Recombinant(Shingrix) 07/05/2018, 02/10/2022, 05/11/2022   Zoster, Live 04/30/2004, 04/13/2018, 07/05/2018   Pertinent  Health Maintenance Due  Topic Date Due   DEXA SCAN  Never done   Influenza Vaccine  06/30/2024      11/04/2020   12:00 PM 04/23/2021   11:34 AM 12/08/2022    9:52 AM 12/11/2022    2:50 PM 05/19/2023    9:23 AM  Fall Risk  Falls  in the past year?   0 0 1  Was there an injury with Fall?   0 0 1  Fall Risk Category Calculator   0 0 2  Fall Risk Category (Retired)   Low  Low    (RETIRED) Patient Fall Risk Level High fall risk  Moderate fall risk  Low fall risk  Low fall risk    Patient at Risk for Falls Due to   No Fall Risks No Fall Risks History of fall(s);Impaired balance/gait;Impaired mobility  Fall risk Follow up   Falls evaluation completed  Falls evaluation completed  Falls evaluation completed     Data saved with a previous flowsheet row definition   Functional Status Survey:    Vitals:   08/30/24 1037  BP: (!) 149/70  Pulse: 95  Resp: 17  Temp: 97.6 F (36.4 C)  SpO2: 95%  Weight: 98 lb 11.2 oz (44.8 kg)  Height: 4' 9 (1.448 m)   Body mass index is 21.36 kg/m. Physical Exam Constitutional:      Comments: frail  HENT:     Head: Normocephalic and atraumatic.     Mouth/Throat:     Mouth: Mucous membranes are moist.  Eyes:     Extraocular Movements: Extraocular movements intact.     Pupils: Pupils are equal, round, and reactive to light.  Cardiovascular:     Rate and Rhythm: Normal rate and regular rhythm.     Heart sounds: No murmur heard.    Comments: Weak left DP pulse, more symptomatic tingling sensation at night.  Pulmonary:     Effort: Pulmonary effort is normal.     Breath sounds: Rales present.     Comments: Fine rales R lung base Abdominal:     General: Bowel sounds are normal.     Palpations: Abdomen is soft.     Tenderness: There is no abdominal tenderness.  Musculoskeletal:     Cervical back: Normal range of motion and neck supple.     Right lower leg: Edema present.     Left lower leg: Edema present.     Comments: mild scoliosis. Hx of back surgeries x3 with left sciatica pain. s/p total left hip replacement. R SIJ pain, positional, palpated, still able to bear weight, walking BLE edema trace    Skin:    General: Skin is warm and dry.  Neurological:     General: No  focal deficit present.     Mental Status: She is alert and oriented to person, place, and time. Mental status is at baseline.     Motor: No weakness.     Coordination: Coordination normal.     Gait: Gait abnormal.     Comments: Intentional tremor in hands, difficulty feeding self-much improved on Propranolol.   Psychiatric:        Mood and Affect: Mood normal.        Behavior: Behavior normal.        Thought Content: Thought content normal.        Judgment: Judgment normal.  Labs reviewed: Recent Labs    06/29/24 1037 06/30/24 0429 07/13/24 1459 08/22/24 0000 08/29/24 2250  NA 132* 127* 132* 132* 131*  K 3.6 3.2* 4.8 4.3 4.0  CL 98 100 99 100 95*  CO2 21* 24 26* 26* 23  GLUCOSE 133* 97  --   --  130*  BUN 12 7* 10 14 17   CREATININE 0.52 0.45 0.6 0.6 0.72  CALCIUM  9.2 8.6* 9.8 9.9 10.0   Recent Labs    06/20/24 0000 06/29/24 1037 08/29/24 2250  AST 17 25 33  ALT 9 14 30   ALKPHOS 67 61 85  BILITOT  --  0.9 0.8  PROT  --  8.0 7.3  ALBUMIN  4.4 4.3 4.2   Recent Labs    05/11/24 0000 06/02/24 1729 06/20/24 0000 06/29/24 1037 06/30/24 0429 08/29/24 2250  WBC 6.1   < > 13.3 8.1 8.2 12.0*  NEUTROABS 3,569.00  --  10,800.00 7.1  --   --   HGB 11.8*   < > 12.6 12.9 11.7* 14.3  HCT 36   < > 37 39.9 35.5* 42.4  MCV  --    < >  --  96.4 98.3 96.8  PLT 441*   < > 457* 550* 342 314   < > = values in this interval not displayed.   Lab Results  Component Value Date   TSH 0.931 06/29/2024   Lab Results  Component Value Date   HGBA1C 5.8 (H) 10/21/2020   Lab Results  Component Value Date   CHOL 171 06/20/2020   HDL 59 06/20/2020   LDLCALC 94 06/20/2020   TRIG 89 06/20/2020   CHOLHDL 2.9 06/20/2020    Significant Diagnostic Results in last 30 days:  CT Head Wo Contrast Result Date: 08/29/2024 CLINICAL DATA:  Mental status change, unknown cause EXAM: CT HEAD WITHOUT CONTRAST TECHNIQUE: Contiguous axial images were obtained from the base of the skull through  the vertex without intravenous contrast. RADIATION DOSE REDUCTION: This exam was performed according to the departmental dose-optimization program which includes automated exposure control, adjustment of the mA and/or kV according to patient size and/or use of iterative reconstruction technique. COMPARISON:  Head CT 06/02/2024 FINDINGS: Brain: No intracranial hemorrhage, mass effect, or midline shift. Stable degree of atrophy and chronic small vessel ischemia. Remote lacunar infarct in the right caudate. No hydrocephalus. The basilar cisterns are patent. No evidence of territorial infarct or acute ischemia. No extra-axial or intracranial fluid collection. Vascular: Atherosclerosis of skullbase vasculature without hyperdense vessel or abnormal calcification. Skull: No fracture or focal lesion. Sinuses/Orbits: No acute finding. Other: None. IMPRESSION: 1. No acute intracranial abnormality. 2. Stable atrophy and chronic small vessel ischemia. Electronically Signed   By: Andrea Gasman M.D.   On: 08/29/2024 23:34   DG Chest Portable 1 View Result Date: 08/29/2024 CLINICAL DATA:  Fall EXAM: PORTABLE CHEST 1 VIEW COMPARISON:  06/29/2024 FINDINGS: Hardware in the cervical spine. Evidence of prior granulomatous disease. Stable cardiomediastinal silhouette. No pneumothorax IMPRESSION: No active disease. Evidence of prior granulomatous disease. Electronically Signed   By: Luke Bun M.D.   On: 08/29/2024 22:22    Assessment/Plan HTN (hypertension), benign Permissive blood pressure control  GERD (gastroesophageal reflux disease) Stable, continue as needed omeprazole  Slow transit constipation Stable, takes MiraLax , Senokot S  Osteoarthritis, multiple sites left hip, s/p total left hip, multiple sites, takes Tylenol , Aleve , Gabapentin  for pain in toes, failed Methocarbamol  and Meloxicam  recommended by Ortho-felt wobbly.  Modified mobility using wheelchair  Cerebrovascular  accident (CVA) (HCC)  Hx of  TIAx2, hx of CVA,  off  Atorvastatin  per patient's request, on ASA   Insomnia secondary to anxiety stable, on  Lorazepam ,  failed Mirtazapine, Lexapro, Ambien .   Hyponatremia stable, Na 131 08/29/24, declined Nacl in the past.  Repeat CBC/differential, CMP/eGFR in setting of mild leukocytosis of WBC 12.0  Hypothyroidism  taking Levothyroxine , TSH 1.72 03/30/24  Seizures (HCC)  saw neurology, hx of recurrent the left sided weakness. EEG showed focal seizure right frontal region. Takes Keppra  since 07/23/20. ED eval 08/29/24 for fall suspected the cause of an active seizure activity, no apparent injury noted.  Generalized frailty, wheelchair for mobility, CXR, CT head/cervical spine, maxillofacial, EKG, CBC/diff, CMP, UA unremarkable.   Edema, peripheral taking  Furosemide , 06/20/20 EF 60-65%     Family/ staff Communication: Plan of care reviewed with the patient and charge nurse  Labs/tests ordered: CBC/differential, CMP/eGFR 1 week

## 2024-08-30 NOTE — Assessment & Plan Note (Signed)
 left hip, s/p total left hip, multiple sites, takes Tylenol , Aleve , Gabapentin  for pain in toes, failed Methocarbamol  and Meloxicam  recommended by Ortho-felt wobbly.  Modified mobility using wheelchair

## 2024-08-30 NOTE — Assessment & Plan Note (Signed)
 Hx of TIAx2, hx of CVA,  off  Atorvastatin  per patient's request, on ASA

## 2024-08-30 NOTE — Assessment & Plan Note (Signed)
 taking Levothyroxine , TSH 1.72 03/30/24

## 2024-08-30 NOTE — Assessment & Plan Note (Signed)
 Stable, continue as needed omeprazole

## 2024-08-30 NOTE — Assessment & Plan Note (Signed)
 stable, Na 131 08/29/24, declined Nacl in the past.  Repeat CBC/differential, CMP/eGFR in setting of mild leukocytosis of WBC 12.0

## 2024-08-30 NOTE — Assessment & Plan Note (Signed)
 saw neurology, hx of recurrent the left sided weakness. EEG showed focal seizure right frontal region. Takes Keppra  since 07/23/20. ED eval 08/29/24 for fall suspected the cause of an active seizure activity, no apparent injury noted.  Generalized frailty, wheelchair for mobility, CXR, CT head/cervical spine, maxillofacial, EKG, CBC/diff, CMP, UA unremarkable.

## 2024-08-30 NOTE — Assessment & Plan Note (Signed)
Stable, takes MiraLax, Senokot S 

## 2024-08-30 NOTE — Assessment & Plan Note (Signed)
 stable, on  Lorazepam ,  failed Mirtazapine, Lexapro, Ambien .

## 2024-08-30 NOTE — Assessment & Plan Note (Signed)
 taking  Furosemide , 06/20/20 EF 60-65%

## 2024-08-30 NOTE — Assessment & Plan Note (Signed)
 Tremor in hands, much better since Propranolol.              RLS takes Gabapentin , off Requip. Vit B12 818 03/25/23(taking Vit B12)

## 2024-09-05 LAB — CBC AND DIFFERENTIAL
HCT: 40 (ref 36–46)
Hemoglobin: 13.4 (ref 12.0–16.0)
Neutrophils Absolute: 2954
Platelets: 467 10*3/uL — AB (ref 150–400)
WBC: 5.4

## 2024-09-05 LAB — BASIC METABOLIC PANEL WITH GFR
BUN: 13 (ref 4–21)
CO2: 27 — AB (ref 13–22)
Chloride: 98 — AB (ref 99–108)
Creatinine: 0.6 (ref 0.5–1.1)
Glucose: 89
Potassium: 4.6 meq/L (ref 3.5–5.1)
Sodium: 133 — AB (ref 137–147)

## 2024-09-05 LAB — HEPATIC FUNCTION PANEL
ALT: 23 U/L (ref 7–35)
AST: 22 (ref 13–35)
Alkaline Phosphatase: 84 (ref 25–125)
Bilirubin, Total: 0.6

## 2024-09-05 LAB — CBC: RBC: 4.11 (ref 3.87–5.11)

## 2024-09-05 LAB — COMPREHENSIVE METABOLIC PANEL WITH GFR
Albumin: 4.2 (ref 3.5–5.0)
Calcium: 9.9 (ref 8.7–10.7)
Globulin: 2.4
eGFR: 86

## 2024-09-11 ENCOUNTER — Non-Acute Institutional Stay (SKILLED_NURSING_FACILITY): Admitting: Nurse Practitioner

## 2024-09-11 ENCOUNTER — Encounter: Payer: Self-pay | Admitting: Nurse Practitioner

## 2024-09-11 DIAGNOSIS — M150 Primary generalized (osteo)arthritis: Secondary | ICD-10-CM

## 2024-09-11 DIAGNOSIS — R569 Unspecified convulsions: Secondary | ICD-10-CM | POA: Diagnosis not present

## 2024-09-11 DIAGNOSIS — F419 Anxiety disorder, unspecified: Secondary | ICD-10-CM

## 2024-09-11 DIAGNOSIS — G2581 Restless legs syndrome: Secondary | ICD-10-CM | POA: Diagnosis not present

## 2024-09-11 DIAGNOSIS — R600 Localized edema: Secondary | ICD-10-CM | POA: Diagnosis not present

## 2024-09-11 DIAGNOSIS — E871 Hypo-osmolality and hyponatremia: Secondary | ICD-10-CM

## 2024-09-11 DIAGNOSIS — F5105 Insomnia due to other mental disorder: Secondary | ICD-10-CM

## 2024-09-11 DIAGNOSIS — R627 Adult failure to thrive: Secondary | ICD-10-CM | POA: Diagnosis not present

## 2024-09-11 DIAGNOSIS — E039 Hypothyroidism, unspecified: Secondary | ICD-10-CM

## 2024-09-11 DIAGNOSIS — K219 Gastro-esophageal reflux disease without esophagitis: Secondary | ICD-10-CM

## 2024-09-11 NOTE — Assessment & Plan Note (Signed)
Not apparent, dc Furosemide/Kcl

## 2024-09-11 NOTE — Assessment & Plan Note (Signed)
 Hx of stroke and TIA, no apparent focal weakness residual, off  Atorvastatin  per patient's request, on ASA

## 2024-09-11 NOTE — Assessment & Plan Note (Signed)
 much better tremor in hands since Propranolol.  RLS  takes Gabapentin , off Requip. Vit B12 818 03/25/23(taking Vit B12)

## 2024-09-11 NOTE — Assessment & Plan Note (Signed)
 stable, taking Omeprazole,  Hgb 14.3 08/29/24

## 2024-09-11 NOTE — Assessment & Plan Note (Signed)
 saw neurology, hx of recurrent the left sided weakness. EEG showed focal seizure right frontal region. Takes Keppra  since 07/23/20.

## 2024-09-11 NOTE — Assessment & Plan Note (Signed)
 stable, on  Lorazepam ,  failed Mirtazapine, Lexapro, Ambien .

## 2024-09-11 NOTE — Assessment & Plan Note (Signed)
 taking Levothyroxine , TSH 1.72 03/30/24

## 2024-09-11 NOTE — Assessment & Plan Note (Signed)
 Gabapentin , Meloxicam  are effective in pain control presently.

## 2024-09-11 NOTE — Assessment & Plan Note (Signed)
 stable, Na 131 08/29/24, declined Nacl in the past.

## 2024-09-11 NOTE — Assessment & Plan Note (Signed)
 generalized weakness, comfortable resting in bed, easily aroused during my visit, denied discomfort, continue supportive care in SNF [facility], comfort measures.

## 2024-09-11 NOTE — Progress Notes (Unsigned)
 Location:   [facility] Room Number: 79 Place of Service:  SNF (31) Provider: Larwance Natash Berman NP  Sherlynn Madden, MD  Patient Care Team: Sherlynn Madden, MD as PCP - General (Internal Medicine)  Extended Emergency Contact Information Primary Emergency Contact: Hertzwig,Kevin Address: [address]          [address]          [place], [place] [address] [place] Home Phone: [phone] Mobile Phone: [phone] Relation: Son Secondary Emergency Contact: Hertzwig,Eric Address: [address] [place] , [place] [address] [place] Home Phone: [phone] Mobile Phone: [phone] Relation: Son  Code Status: DNR Goals of care: Advanced Directive information    08/29/2024    9:51 PM  Advanced Directives  Does Patient Have a Medical Advance Directive? Yes  Type of Advance Directive Out of facility DNR (pink MOST or yellow form)     Chief Complaint  Patient presents with  . Medical Management of Chronic Issues    HPI:  Pt is a 89 y.o. female seen today for managing chronic medical conditions.  Generalized weakness, comfortable resting in bed, easily aroused during my visit, denied discomfort.              Failure to thrive, supportive care in SNF [facility], comfort measures.              Edema BLE, not apparent, 09/11/24 off  Furosemide , 06/20/20 EF 60-65%             Tremor in hands, much better since Propranolol.              RLS takes Gabapentin , off Requip. Vit B12 818 03/25/23(taking Vit B12)             Seizure, saw neurology, hx of recurrent the left sided weakness. EEG showed focal seizure right frontal region. Takes Keppra  since 07/23/20.             Hypothyroidism, taking Levothyroxine , TSH 1.72 03/30/24             Hyponatremia, stable, Na 131 08/29/24, declined Nacl in the past.              Hypokalemia, K 4.0 08/29/24, on potassium supplement             Anxiety, stable, on  Lorazepam ,  failed Mirtazapine, Lexapro,  Ambien .              GERD, stable, taking Omeprazole,  Hgb 14.3 08/29/24             Constipation, takes MiraLax , Senokot S             OA, left hip, s/p total left hip, multiple sites, takes Mobic , Gabapentin  for pain in toes, failed Methocarbamol  recommended by Ortho-felt wobbly.              OP off Boniva, Ca, Vit D, declined DEXA, Vit D 16 10/07/21.              TIA, x2, off  Atorvastatin  per patient's request, on ASA              Urinary frequency, prolapsed bladder, chronic,  declined Myrbetriq 2/2 to the cost.   Past Medical History:  Diagnosis Date  . Abdominal pain 02/26/2017  . Anxiety   . Arthritis    Left Knee, Wrist, Back and Hips  . Cervical vertebral fusion   .  Diverticulitis   . Diverticulosis   . Fibromyalgia   . GERD (gastroesophageal reflux disease)   . Gout 02/2018   L hand  . Hypertension     after d/c from hospital no current problems or medication  . Hypothyroidism   . Pelvis fracture (HCC)   . Peritoneal free air 03/29/2012  . Pneumonia   . Pneumoperitoneum 02/26/2017  . Pre-diabetes   . Seizure (HCC)   . Thyroid  disorder   . Toe pain 09/06/2016   Past Surgical History:  Procedure Laterality Date  . ABDOMINAL HYSTERECTOMY    . BOWEL RESECTION N/A 07/09/2017   Procedure: SMALL BOWEL RESECTION;  Surgeon: Gladis Cough, MD;  Location: [facility];  Service: General;  Laterality: N/A;  . CERVICAL FUSION     x2  . CONVERSION TO TOTAL HIP Left 10/31/2020   Procedure: CONVERSION TO ANTERIOR TOTAL HIP;  Surgeon: Ernie Cough, MD;  Location: [facility];  Service: Orthopedics;  Laterality: Left;  2 hrs  . FEMUR IM NAIL Left 03/03/2020   Procedure: INTRAMEDULLARY (IM) NAIL FEMORAL;  Surgeon: Ernie Cough, MD;  Location: [facility];  Service: Orthopedics;  Laterality: Left;  . LAPAROSCOPIC APPENDECTOMY N/A 03/04/2017   Procedure: APPENDECTOMY LAPAROSCOPIC;  Surgeon: Cough Gladis, MD;  Location: [facility];  Service: General;  Laterality: N/A;  . LAPAROSCOPY N/A 03/04/2017    Procedure: LAPAROSCOPY DIAGNOSTIC, ENTEROLYSIS;  Surgeon: Cough Gladis, MD;  Location: [facility];  Service: General;  Laterality: N/A;  . LAPAROTOMY N/A 07/09/2017   Procedure: EXPLORATORY LAPAROTOMY;  Surgeon: Gladis Cough, MD;  Location: [facility];  Service: General;  Laterality: N/A;  . SPINE SURGERY     Lumbar- rod placement  . TONSILLECTOMY     89y/o  . TOTAL VAGINAL HYSTERECTOMY      Allergies  Allergen Reactions  . Codeine Rash  . Penicillins Rash    Has patient had a PCN reaction causing immediate rash, facial/tongue/throat swelling, SOB or lightheadedness with hypotension: No Has patient had a PCN reaction causing severe rash involving mucus membranes or skin necrosis: No Has patient had a PCN reaction that required hospitalization: No Has patient had a PCN reaction occurring within the last 10 years: No If all of the above answers are NO, then may proceed with Cephalosporin use.  Tolerated Cephalosporin 10/31/20    . Levaquin [Levofloxacin] Other (See Comments)    Allergic, per MAR  . Tuberculin Tests Other (See Comments)    Allergic, per MAR  . Tuberculin, Ppd Other (See Comments)    Allergic, per MAR    Allergies as of 09/11/2024       Reactions   Codeine Rash   Penicillins Rash   Has patient had a PCN reaction causing immediate rash, facial/tongue/throat swelling, SOB or lightheadedness with hypotension: No Has patient had a PCN reaction causing severe rash involving mucus membranes or skin necrosis: No Has patient had a PCN reaction that required hospitalization: No Has patient had a PCN reaction occurring within the last 10 years: No If all of the above answers are NO, then may proceed with Cephalosporin use. Tolerated Cephalosporin 10/31/20   Levaquin [levofloxacin] Other (See Comments)   Allergic, per MAR   Tuberculin Tests Other (See Comments)   Allergic, per MAR   Tuberculin, Ppd Other (See Comments)   Allergic, per [facility]         Medication List        Accurate as of September 11, 2024  1:46 PM. If you have any questions, ask  your nurse or doctor.          STOP taking these medications    furosemide  20 MG tablet Commonly known as: LASIX  Stopped by: Areesha Dehaven X Olyver Hawes   mupirocin  ointment 2 % Commonly known as: BACTROBAN  Stopped by: Durant Scibilia X Ambria Mayfield   Pataday 0.7 % Soln Generic drug: Olopatadine HCl Stopped by: Giana Castner X Josefita Weissmann   potassium chloride  10 MEQ tablet Commonly known as: KLOR-CON  Stopped by: Rohith Fauth X Brooke Steinhilber   senna 8.6 MG tablet Commonly known as: SENOKOT Stopped by: Areeb Corron X Jalissa Heinzelman   sodium chloride  1 g tablet Stopped by: Tyteanna Ost X Cheris Tweten   zolpidem  5 MG tablet Commonly known as: AMBIEN  Stopped by: Tryniti Laatsch X Angelito Hopping       TAKE these medications    acetaminophen  500 MG tablet Commonly known as: TYLENOL  Take 1,000 mg by mouth 2 (two) times daily as needed (for pain).   aspirin  EC 81 MG tablet Take 81 mg by mouth at bedtime. Swallow whole.   cyanocobalamin 1000 MCG tablet Commonly known as: VITAMIN B12 Take 1,000 mcg by mouth daily.   gabapentin  100 MG capsule Commonly known as: NEURONTIN  Take 1 capsule (100 mg total) by mouth at bedtime.   levETIRAcetam  500 MG 24 hr tablet Commonly known as: Keppra  XR Take 1 tablet (500 mg total) by mouth daily.   levothyroxine  75 MCG tablet Commonly known as: SYNTHROID  Take 1 tablet (75 mcg total) by mouth daily before breakfast.   LORazepam  0.5 MG tablet Commonly known as: ATIVAN  Take 0.5 mg by mouth at bedtime.  Give 0.5 mg by mouth at bedtime related to ANXIETY DISORDER, UNSPECIFIED (F41.9)   Melatonin 5 MG Chew Chew 5 mg by mouth at bedtime.   meloxicam  7.5 MG tablet Commonly known as: MOBIC  Take 1 tablet (7.5 mg total) by mouth daily.   Multivitamin Tabs Take 1 tablet by mouth daily with breakfast.   omeprazole 20 MG capsule Commonly known as: PRILOSEC Take 20 mg by mouth daily as needed (for indigestion/upset stomach).   OXYGEN Inhale 2 L/min into  the lungs as needed (TO KEEP SATS AT >90%).   Oyster Shell 500 MG Tabs Take 500 mg by mouth daily.   polyethylene glycol 17 g packet Commonly known as: MIRALAX  / GLYCOLAX  Take 17 g by mouth daily as needed for mild constipation.   propranolol 10 MG tablet Commonly known as: INDERAL Take 10 mg by mouth 2 (two) times daily.   sennosides-docusate sodium  8.6-50 MG tablet Commonly known as: SENOKOT-S Take 1-2 tablets by mouth daily as needed for constipation.   sodium fluoride  1.1 % Gel dental gel Commonly known as: FLUORISHIELD Place 1 Application onto teeth in the morning.        Review of Systems  Constitutional:  Positive for appetite change and fatigue. Negative for fever.       Gradual weight loss  HENT:  Positive for hearing loss. Negative for congestion and trouble swallowing.   Eyes:  Negative for visual disturbance.  Respiratory:  Negative for cough and shortness of breath.        DOE  Cardiovascular:  Negative for chest pain, palpitations and leg swelling.  Gastrointestinal:  Negative for abdominal pain and constipation.  Genitourinary:  Positive for frequency. Negative for dysuria and urgency.       Bathroom trips 3-4x/night  Musculoskeletal:  Positive for arthralgias, back pain and gait problem.       Left hip pain, s/p total left hip arthroplasty. Multiple sites aches in  am, toes.  Chronic back pain  Skin:  Negative for color change.  Neurological:  Negative for tremors, seizures and weakness.       Restless legs at night.  Memory lapses.   Psychiatric/Behavioral:  Negative for behavioral problems and sleep disturbance. The patient is not nervous/anxious.     Immunization History  Administered Date(s) Administered  . Fluzone Influenza virus vaccine,trivalent (IIV3), split virus 08/11/2018, 08/03/2019  . INFLUENZA, HIGH DOSE SEASONAL PF 09/01/2023  . Influenza-Unspecified 09/01/2018, 09/10/2020, 09/18/2021, 09/21/2022  . Moderna Covid-19 Fall Seasonal  Vaccine 42yrs & older 09/15/2023  . Moderna Sars-Covid-2 Vaccination 12/04/2019, 01/01/2020  . Pfizer Covid-19 Vaccine Bivalent Booster 88yrs & up 10/01/2022  . Pneumococcal Conjugate-13 05/29/2014  . Pneumococcal Polysaccharide-23 06/14/2015  . Tdap 09/14/2012  . Zoster Recombinant(Shingrix) 07/05/2018, 02/10/2022, 05/11/2022  . Zoster, Live 04/30/2004, 04/13/2018, 07/05/2018   Pertinent  Health Maintenance Due  Topic Date Due  . DEXA SCAN  Never done  . Influenza Vaccine  06/30/2024      11/04/2020   12:00 PM 04/23/2021   11:34 AM 12/08/2022    9:52 AM 12/11/2022    2:50 PM 05/19/2023    9:23 AM  Fall Risk  Falls in the past year?   0 0 1  Was there an injury with Fall?   0 0 1  Fall Risk Category Calculator   0 0 2  Fall Risk Category (Retired)   Low  Low    (RETIRED) Patient Fall Risk Level High fall risk  Moderate fall risk  Low fall risk  Low fall risk    Patient at Risk for Falls Due to   No Fall Risks No Fall Risks History of fall(s);Impaired balance/gait;Impaired mobility  Fall risk Follow up   Falls evaluation completed  Falls evaluation completed  Falls evaluation completed     Data saved with a previous flowsheet row definition   Functional Status Survey:    Vitals:   09/11/24 1052  BP: 139/66  Pulse: 84  Resp: 17  Temp: 97.6 F (36.4 C)  SpO2: 95%  Weight: 97 lb 3.2 oz (44.1 kg)   Body mass index is 21.03 kg/m. Physical Exam Constitutional:      Comments: frail  HENT:     Head: Normocephalic and atraumatic.     Mouth/Throat:     Mouth: Mucous membranes are moist.  Eyes:     Extraocular Movements: Extraocular movements intact.     Pupils: Pupils are equal, round, and reactive to light.  Cardiovascular:     Rate and Rhythm: Normal rate and regular rhythm.     Heart sounds: No murmur heard.    Comments: Weak left DP pulse, more symptomatic tingling sensation at night.  Pulmonary:     Effort: Pulmonary effort is normal.     Breath sounds: Rales  present.     Comments: Fine rales R lung base Abdominal:     General: Bowel sounds are normal.     Palpations: Abdomen is soft.     Tenderness: There is no abdominal tenderness.  Musculoskeletal:     Cervical back: Normal range of motion and neck supple.     Right lower leg: No edema.     Left lower leg: No edema.     Comments: mild scoliosis. Hx of back surgeries x3 with left sciatica pain. s/p total left hip replacement. R SIJ pain, positional     Skin:    General: Skin is warm and dry.  Neurological:  General: No focal deficit present.     Mental Status: She is alert and oriented to person, place, and time. Mental status is at baseline.     Motor: No weakness.     Gait: Gait abnormal.     Comments: Intentional tremor in hands, difficulty feeding self-much improved on Propranolol.   Psychiatric:        Mood and Affect: Mood normal.        Behavior: Behavior normal.        Thought Content: Thought content normal.        Judgment: Judgment normal.     Labs reviewed: Recent Labs    06/29/24 1037 06/30/24 0429 07/13/24 1459 08/22/24 0000 08/29/24 2250  NA 132* 127* 132* 132* 131*  K 3.6 3.2* 4.8 4.3 4.0  CL 98 100 99 100 95*  CO2 21* 24 26* 26* 23  GLUCOSE 133* 97  --   --  130*  BUN 12 7* 10 14 17   CREATININE 0.52 0.45 0.6 0.6 0.72  CALCIUM  9.2 8.6* 9.8 9.9 10.0   Recent Labs    06/20/24 0000 06/29/24 1037 08/29/24 2250  AST 17 25 33  ALT 9 14 30   ALKPHOS 67 61 85  BILITOT  --  0.9 0.8  PROT  --  8.0 7.3  ALBUMIN  4.4 4.3 4.2   Recent Labs    05/11/24 0000 06/02/24 1729 06/20/24 0000 06/29/24 1037 06/30/24 0429 08/29/24 2250  WBC 6.1   < > 13.3 8.1 8.2 12.0*  NEUTROABS 3,569.00  --  10,800.00 7.1  --   --   HGB 11.8*   < > 12.6 12.9 11.7* 14.3  HCT 36   < > 37 39.9 35.5* 42.4  MCV  --    < >  --  96.4 98.3 96.8  PLT 441*   < > 457* 550* 342 314   < > = values in this interval not displayed.   Lab Results  Component Value Date   TSH 0.931  06/29/2024   Lab Results  Component Value Date   HGBA1C 5.8 (H) 10/21/2020   Lab Results  Component Value Date   CHOL 171 06/20/2020   HDL 59 06/20/2020   LDLCALC 94 06/20/2020   TRIG 89 06/20/2020   CHOLHDL 2.9 06/20/2020    Significant Diagnostic Results in last 30 days:  CT Head Wo Contrast Result Date: 08/29/2024 CLINICAL DATA:  Mental status change, unknown cause EXAM: CT HEAD WITHOUT CONTRAST TECHNIQUE: Contiguous axial images were obtained from the base of the skull through the vertex without intravenous contrast. RADIATION DOSE REDUCTION: This exam was performed according to the departmental dose-optimization program which includes automated exposure control, adjustment of the mA and/or kV according to patient size and/or use of iterative reconstruction technique. COMPARISON:  Head CT 06/02/2024 FINDINGS: Brain: No intracranial hemorrhage, mass effect, or midline shift. Stable degree of atrophy and chronic small vessel ischemia. Remote lacunar infarct in the right caudate. No hydrocephalus. The basilar cisterns are patent. No evidence of territorial infarct or acute ischemia. No extra-axial or intracranial fluid collection. Vascular: Atherosclerosis of skullbase vasculature without hyperdense vessel or abnormal calcification. Skull: No fracture or focal lesion. Sinuses/Orbits: No acute finding. Other: None. IMPRESSION: 1. No acute intracranial abnormality. 2. Stable atrophy and chronic small vessel ischemia. Electronically Signed   By: Andrea Gasman M.D.   On: 08/29/2024 23:34   DG Chest Portable 1 View Result Date: 08/29/2024 CLINICAL DATA:  Fall EXAM: PORTABLE CHEST 1 VIEW COMPARISON:  06/29/2024 FINDINGS: Hardware in the cervical spine. Evidence of prior granulomatous disease. Stable cardiomediastinal silhouette. No pneumothorax IMPRESSION: No active disease. Evidence of prior granulomatous disease. Electronically Signed   By: Luke Bun M.D.   On: 08/29/2024 22:22     Assessment/Plan: Edema, peripheral Not apparent, dc Furosemide /Kcl  Adult failure to thrive generalized weakness, comfortable resting in bed, easily aroused during my visit, denied discomfort, continue supportive care in SNF [facility], comfort measures.   Restless leg syndrome much better tremor in hands since Propranolol.  RLS  takes Gabapentin , off Requip. Vit B12 818 03/25/23(taking Vit B12)  Seizures (HCC)  saw neurology, hx of recurrent the left sided weakness. EEG showed focal seizure right frontal region. Takes Keppra  since 07/23/20.  Hypothyroidism taking Levothyroxine , TSH 1.72 03/30/24  Hyponatremia stable, Na 131 08/29/24, declined Nacl in the past.   Insomnia secondary to anxiety  stable, on  Lorazepam ,  failed Mirtazapine, Lexapro, Ambien .   GERD (gastroesophageal reflux disease)  stable, taking Omeprazole,  Hgb 14.3 08/29/24  Osteoarthritis, multiple sites Gabapentin , Meloxicam  are effective in pain control presently.   Cerebrovascular accident (CVA) (HCC) Hx of stroke and TIA, no apparent focal weakness residual, off  Atorvastatin  per patient's request, on ASA     Family/ staff Communication: plan of care reviewed with the patient and charge nurse.   Labs/tests ordered:  none

## 2024-10-02 ENCOUNTER — Encounter: Payer: Self-pay | Admitting: Adult Health

## 2024-10-02 ENCOUNTER — Non-Acute Institutional Stay (SKILLED_NURSING_FACILITY): Admitting: Adult Health

## 2024-10-02 DIAGNOSIS — E871 Hypo-osmolality and hyponatremia: Secondary | ICD-10-CM | POA: Diagnosis not present

## 2024-10-02 DIAGNOSIS — I10 Essential (primary) hypertension: Secondary | ICD-10-CM | POA: Diagnosis not present

## 2024-10-02 NOTE — Progress Notes (Signed)
 Location:   [facility]:   SNF  Rm 63 A  Provider:  Medina-Vargas, Dicie Edelen, DNP, FNP-BC  Patient Care Team: Sherlynn Madden, MD as PCP - General (Internal Medicine)  Extended Emergency Contact Information Primary Emergency Contact: Hertzwig,Kevin Address: [address]          [address]          [place], [place] [address] [place] Home Phone: [phone] Mobile Phone: [phone] Relation: Son Secondary Emergency Contact: Hertzwig,Eric Address: [address]          [place] , [place] [address] [place] Home Phone: [phone] Mobile Phone: [phone] Relation: Son  Code Status:   DNR  Goals of care: Advanced Directive information    08/29/2024    9:51 PM  Advanced Directives  Does Patient Have a Medical Advance Directive? Yes  Type of Advance Directive Out of facility DNR (pink MOST or yellow form)     Chief Complaint  Patient presents with   Acute Visit    Elevated BP    HPI:  Pt is a 89 y.o. female seen today for an acute visit regarding elevated BP. She is a resident of [facility]. BP 145/80, 162/88, 123/73, 139/66, 144/70.  She is currently taking propranolol 10 mg twice a day for hypertension.  She verbalized not wanting to take additional medications but willing to monitor her blood pressure closely.  She stated that it does not bother her to have a stroke.  She has chronic hyponatremia and recently stopped taking NaCl. Last sodium level was 133, 09/05/2024.   Past Medical History:  Diagnosis Date   Abdominal pain 02/26/2017   Anxiety    Arthritis    Left Knee, Wrist, Back and Hips   Cervical vertebral fusion    Diverticulitis    Diverticulosis    Fibromyalgia    GERD (gastroesophageal reflux disease)    Gout 02/2018   L hand   Hypertension     after d/c from hospital no current problems or medication   Hypothyroidism    Pelvis fracture (HCC)    Peritoneal free air  03/29/2012   Pneumonia    Pneumoperitoneum 02/26/2017   Pre-diabetes    Seizure (HCC)    Thyroid  disorder    Toe pain 09/06/2016   Past Surgical History:  Procedure Laterality Date   ABDOMINAL HYSTERECTOMY     BOWEL RESECTION N/A 07/09/2017   Procedure: SMALL BOWEL RESECTION;  Surgeon: Gladis Cough, MD;  Location: [facility];  Service: General;  Laterality: N/A;   CERVICAL FUSION     x2   CONVERSION TO TOTAL HIP Left 10/31/2020   Procedure: CONVERSION TO ANTERIOR TOTAL HIP;  Surgeon: Ernie Cough, MD;  Location: [facility];  Service: Orthopedics;  Laterality: Left;  2 hrs   FEMUR IM NAIL Left 03/03/2020   Procedure: INTRAMEDULLARY (IM) NAIL FEMORAL;  Surgeon: Ernie Cough, MD;  Location: [facility];  Service: Orthopedics;  Laterality: Left;   LAPAROSCOPIC APPENDECTOMY N/A 03/04/2017   Procedure: APPENDECTOMY LAPAROSCOPIC;  Surgeon: Cough Gladis, MD;  Location: [facility];  Service: General;  Laterality: N/A;   LAPAROSCOPY N/A 03/04/2017   Procedure: LAPAROSCOPY DIAGNOSTIC, ENTEROLYSIS;  Surgeon: Cough Gladis, MD;  Location: [facility];  Service: General;  Laterality: N/A;   LAPAROTOMY N/A 07/09/2017   Procedure: EXPLORATORY LAPAROTOMY;  Surgeon: Gladis Cough, MD;  Location: [facility];  Service: General;  Laterality: N/A;   SPINE SURGERY  Lumbar- rod placement   TONSILLECTOMY     89y/o   TOTAL VAGINAL HYSTERECTOMY      Allergies  Allergen Reactions   Codeine Rash   Penicillins Rash    Has patient had a PCN reaction causing immediate rash, facial/tongue/throat swelling, SOB or lightheadedness with hypotension: No Has patient had a PCN reaction causing severe rash involving mucus membranes or skin necrosis: No Has patient had a PCN reaction that required hospitalization: No Has patient had a PCN reaction occurring within the last 10 years: No If all of the above answers are NO, then may proceed with Cephalosporin use.  Tolerated Cephalosporin 10/31/20     Levaquin [Levofloxacin] Other (See  Comments)    Allergic, per MAR   Tuberculin Tests Other (See Comments)    Allergic, per MAR   Tuberculin, Ppd Other (See Comments)    Allergic, per [facility]    Outpatient Encounter Medications as of 10/02/2024  Medication Sig   acetaminophen  (TYLENOL ) 500 MG tablet Take 1,000 mg by mouth 2 (two) times daily as needed (for pain).   aspirin  EC 81 MG tablet Take 81 mg by mouth at bedtime. Swallow whole.   cyanocobalamin (VITAMIN B12) 1000 MCG tablet Take 1,000 mcg by mouth daily.   gabapentin  (NEURONTIN ) 100 MG capsule Take 1 capsule (100 mg total) by mouth at bedtime.   levETIRAcetam  (KEPPRA  XR) 500 MG 24 hr tablet Take 1 tablet (500 mg total) by mouth daily.   levothyroxine  (SYNTHROID ) 75 MCG tablet Take 1 tablet (75 mcg total) by mouth daily before breakfast.   LORazepam  (ATIVAN ) 0.5 MG tablet Take 0.5 mg by mouth at bedtime.  Give 0.5 mg by mouth at bedtime related to ANXIETY DISORDER, UNSPECIFIED (F41.9)   Melatonin 5 MG CHEW Chew 5 mg by mouth at bedtime.   meloxicam  (MOBIC ) 7.5 MG tablet Take 1 tablet (7.5 mg total) by mouth daily.   Multiple Vitamin (MULTIVITAMIN) TABS Take 1 tablet by mouth daily with breakfast.   omeprazole (PRILOSEC) 20 MG capsule Take 20 mg by mouth daily as needed (for indigestion/upset stomach).   OXYGEN Inhale 2 L/min into the lungs as needed (TO KEEP SATS AT >90%).   Oyster Shell 500 MG TABS Take 500 mg by mouth daily.   polyethylene glycol (MIRALAX  / GLYCOLAX ) 17 g packet Take 17 g by mouth daily as needed for mild constipation.   propranolol (INDERAL) 10 MG tablet Take 10 mg by mouth 2 (two) times daily.   sennosides-docusate sodium  (SENOKOT-S) 8.6-50 MG tablet Take 1-2 tablets by mouth daily as needed for constipation.   sodium fluoride  (FLUORISHIELD) 1.1 % GEL dental gel Place 1 Application onto teeth in the morning.   No facility-administered encounter medications on file as of 10/02/2024.    Review of Systems  Constitutional:  Negative for appetite  change, chills, fatigue and fever.  HENT:  Negative for congestion, hearing loss, rhinorrhea and sore throat.   Eyes: Negative.   Respiratory:  Negative for cough, shortness of breath and wheezing.   Cardiovascular:  Negative for chest pain, palpitations and leg swelling.  Gastrointestinal:  Negative for abdominal pain, constipation, diarrhea, nausea and vomiting.  Genitourinary:  Negative for dysuria.  Musculoskeletal:  Negative for arthralgias, back pain and myalgias.  Skin:  Negative for color change, rash and wound.  Neurological:  Negative for dizziness, weakness and headaches.  Psychiatric/Behavioral:  Negative for behavioral problems. The patient is not nervous/anxious.      Immunization History  Administered Date(s) Administered  Fluzone Influenza virus vaccine,trivalent (IIV3), split virus 08/11/2018, 08/03/2019   INFLUENZA, HIGH DOSE SEASONAL PF 09/01/2023   Influenza-Unspecified 09/01/2018, 09/10/2020, 09/18/2021, 09/21/2022   Moderna Covid-19 Fall Seasonal Vaccine 7yrs & older 09/15/2023   Moderna Sars-Covid-2 Vaccination 12/04/2019, 01/01/2020   Pfizer Covid-19 Vaccine Bivalent Booster 67yrs & up 10/01/2022   Pneumococcal Conjugate-13 05/29/2014   Pneumococcal Polysaccharide-23 06/14/2015   Tdap 09/14/2012   Zoster Recombinant(Shingrix) 07/05/2018, 02/10/2022, 05/11/2022   Zoster, Live 04/30/2004, 04/13/2018, 07/05/2018   Pertinent  Health Maintenance Due  Topic Date Due   DEXA SCAN  Never done   Influenza Vaccine  06/30/2024      11/04/2020   12:00 PM 04/23/2021   11:34 AM 12/08/2022    9:52 AM 12/11/2022    2:50 PM 05/19/2023    9:23 AM  Fall Risk  Falls in the past year?   0 0 1  Was there an injury with Fall?   0 0 1  Fall Risk Category Calculator   0 0 2  Fall Risk Category (Retired)   Low  Low    (RETIRED) Patient Fall Risk Level High fall risk  Moderate fall risk  Low fall risk  Low fall risk    Patient at Risk for Falls Due to   No Fall Risks No Fall  Risks History of fall(s);Impaired balance/gait;Impaired mobility  Fall risk Follow up   Falls evaluation completed  Falls evaluation completed  Falls evaluation completed     Data saved with a previous flowsheet row definition     Vitals:   10/02/24 1300  BP: (!) 145/80  Pulse: 72  Resp: 17  Temp: 97.6 F (36.4 C)  SpO2: 95%  Weight: 100 lb 14.4 oz (45.8 kg)  Height: 5' (1.524 m)   Body mass index is 19.71 kg/m.  Physical Exam Constitutional:      Appearance: Normal appearance.  HENT:     Head: Normocephalic and atraumatic.     Nose: Nose normal.     Mouth/Throat:     Mouth: Mucous membranes are moist.  Eyes:     Conjunctiva/sclera: Conjunctivae normal.  Cardiovascular:     Rate and Rhythm: Normal rate and regular rhythm.  Pulmonary:     Effort: Pulmonary effort is normal.     Breath sounds: Normal breath sounds.  Abdominal:     General: Bowel sounds are normal.     Palpations: Abdomen is soft.  Musculoskeletal:        General: Normal range of motion.     Cervical back: Normal range of motion.  Skin:    General: Skin is warm and dry.  Neurological:     Mental Status: She is alert.  Psychiatric:        Mood and Affect: Mood normal.        Behavior: Behavior normal.      Labs reviewed: Recent Labs    06/29/24 1037 06/30/24 0429 07/13/24 1459 08/22/24 0000 08/29/24 2250  NA 132* 127* 132* 132* 131*  K 3.6 3.2* 4.8 4.3 4.0  CL 98 100 99 100 95*  CO2 21* 24 26* 26* 23  GLUCOSE 133* 97  --   --  130*  BUN 12 7* 10 14 17   CREATININE 0.52 0.45 0.6 0.6 0.72  CALCIUM  9.2 8.6* 9.8 9.9 10.0   Recent Labs    06/20/24 0000 06/29/24 1037 08/29/24 2250  AST 17 25 33  ALT 9 14 30   ALKPHOS 67 61 85  BILITOT  --  0.9 0.8  PROT  --  8.0 7.3  ALBUMIN  4.4 4.3 4.2   Recent Labs    05/11/24 0000 06/02/24 1729 06/20/24 0000 06/29/24 1037 06/30/24 0429 08/29/24 2250  WBC 6.1   < > 13.3 8.1 8.2 12.0*  NEUTROABS 3,569.00  --  10,800.00 7.1  --   --   HGB  11.8*   < > 12.6 12.9 11.7* 14.3  HCT 36   < > 37 39.9 35.5* 42.4  MCV  --    < >  --  96.4 98.3 96.8  PLT 441*   < > 457* 550* 342 314   < > = values in this interval not displayed.   Lab Results  Component Value Date   TSH 0.931 06/29/2024   Lab Results  Component Value Date   HGBA1C 5.8 (H) 10/21/2020   Lab Results  Component Value Date   CHOL 171 06/20/2020   HDL 59 06/20/2020   LDLCALC 94 06/20/2020   TRIG 89 06/20/2020   CHOLHDL 2.9 06/20/2020    Significant Diagnostic Results in last 30 days:  No results found.  Assessment/Plan  1. HTN (hypertension), benign (Primary) -  BP 145/80 -   Continue propranolol 10 mg twice a day -  Monitoring BP/HR twice a day x 1 week  2. Hyponatremia Lab Results  Component Value Date   NA 131 (L) 08/29/2024   K 4.0 08/29/2024   CO2 23 08/29/2024   GLUCOSE 130 (H) 08/29/2024   BUN 17 08/29/2024   CREATININE 0.72 08/29/2024   CALCIUM  10.0 08/29/2024   EGFR 86 08/22/2024   GFRNONAA >60 08/29/2024    -  sodium 133, 09/05/24 - Recently stopped sodium chloride  -  will re-check BMP    Family/ staff Communication: Discussed plan of care with resident and charge nurse.  Labs/tests ordered:   BMP    Scottie Stanish Medina-Vargas, DNP, MSN, FNP-BC [facility] [phone] (Monday-Friday 8:00 a.m. - 5:00 p.m.) [phone] (after hours)

## 2024-10-03 LAB — COMPREHENSIVE METABOLIC PANEL WITH GFR
Calcium: 10.3 (ref 8.7–10.7)
eGFR: 83

## 2024-10-03 LAB — BASIC METABOLIC PANEL WITH GFR
BUN: 13 (ref 4–21)
CO2: 27 — AB (ref 13–22)
Chloride: 96 — AB (ref 99–108)
Creatinine: 0.6 (ref 0.5–1.1)
Glucose: 87
Potassium: 4.5 meq/L (ref 3.5–5.1)
Sodium: 133 — AB (ref 137–147)

## 2024-10-04 ENCOUNTER — Encounter: Payer: Self-pay | Admitting: Nurse Practitioner

## 2024-10-04 NOTE — Progress Notes (Signed)
 This encounter was created in error - please disregard.

## 2024-10-06 ENCOUNTER — Encounter: Payer: Self-pay | Admitting: Nurse Practitioner

## 2024-10-06 ENCOUNTER — Non-Acute Institutional Stay (SKILLED_NURSING_FACILITY): Payer: Self-pay | Admitting: Nurse Practitioner

## 2024-10-06 DIAGNOSIS — R251 Tremor, unspecified: Secondary | ICD-10-CM | POA: Diagnosis not present

## 2024-10-06 DIAGNOSIS — F5105 Insomnia due to other mental disorder: Secondary | ICD-10-CM

## 2024-10-06 DIAGNOSIS — K219 Gastro-esophageal reflux disease without esophagitis: Secondary | ICD-10-CM

## 2024-10-06 DIAGNOSIS — E039 Hypothyroidism, unspecified: Secondary | ICD-10-CM | POA: Diagnosis not present

## 2024-10-06 DIAGNOSIS — F419 Anxiety disorder, unspecified: Secondary | ICD-10-CM

## 2024-10-06 DIAGNOSIS — R569 Unspecified convulsions: Secondary | ICD-10-CM

## 2024-10-06 DIAGNOSIS — K5901 Slow transit constipation: Secondary | ICD-10-CM

## 2024-10-06 DIAGNOSIS — E871 Hypo-osmolality and hyponatremia: Secondary | ICD-10-CM | POA: Diagnosis not present

## 2024-10-06 NOTE — Assessment & Plan Note (Signed)
 stable, Na 133 10/03/24, declined Nacl in the past.

## 2024-10-06 NOTE — Progress Notes (Signed)
 Location:   [facility] Room Number: 56 Place of Service:  SNF (31) Provider: Larwance Kelcee Bjorn NP  Sherlynn Madden, MD  Patient Care Team: Sherlynn Madden, MD as PCP - General (Internal Medicine)  Extended Emergency Contact Information Primary Emergency Contact: Hertzwig,Kevin Address: [address]          [address]          [place], [place] [address] [place] Home Phone: [phone] Mobile Phone: [phone] Relation: Son Secondary Emergency Contact: Hertzwig,Eric Address: [address] [place] , [place] [address] [place] Home Phone: [phone] Mobile Phone: [phone] Relation: Son  Code Status: DNR Goals of care: Advanced Directive information    08/29/2024    9:51 PM  Advanced Directives  Does Patient Have a Medical Advance Directive? Yes  Type of Advance Directive Out of facility DNR (pink MOST or yellow form)     Chief Complaint  Patient presents with   Acute Visit    UTI    HPI:  Pt is a 89 y.o. female seen today for an acute visit for c/o burning sensation upon urination  Urine culture 10/01/24 E Coli 50-99,000c/ml  The patient appears tired, more generalized weakness, afebrile, denied lower abd pain/pressure  Failure to thrive, supportive care in SN[facility], comfort measures.              Edema BLE, not apparent, 09/11/24 off  Furosemide , 06/20/20 EF 60-65%             Tremor in hands, much better since Propranolol.              RLS takes Gabapentin , off Requip. Vit B12 818 03/25/23(taking Vit B12)             Seizure, saw neurology, hx of recurrent the left sided weakness. EEG showed focal seizure right frontal region. Takes Keppra  since 07/23/20.             Hypothyroidism, taking Levothyroxine , TSH 1.72 03/30/24             Hyponatremia, stable, Na 133 10/03/24, declined Nacl in the past.              Hypokalemia, K 4.0 08/29/24, on potassium supplement             Anxiety, stable, on  Lorazepam ,  failed  Mirtazapine, Lexapro, Ambien .              GERD, stable, taking Omeprazole,  Hgb 14.3 08/29/24             Constipation, takes MiraLax , Senokot S             OA, left hip, s/p total left hip, multiple sites, takes Mobic , Gabapentin  for pain in toes, failed Methocarbamol  recommended by Ortho-felt wobbly.              OP off Boniva, Ca, Vit D, declined DEXA, Vit D 16 10/07/21.              TIA, x2, off  Atorvastatin  per patient's request, on ASA              Urinary frequency, prolapsed bladder, chronic,  declined Myrbetriq 2/2 to the cost.    Past Medical History:  Diagnosis Date   Abdominal pain 02/26/2017   Anxiety    Arthritis    Left Knee, Wrist, Back and Hips   Cervical vertebral fusion  Diverticulitis    Diverticulosis    Fibromyalgia    GERD (gastroesophageal reflux disease)    Gout 02/2018   L hand   Hypertension     after d/c from hospital no current problems or medication   Hypothyroidism    Pelvis fracture (HCC)    Peritoneal free air 03/29/2012   Pneumonia    Pneumoperitoneum 02/26/2017   Pre-diabetes    Seizure (HCC)    Thyroid  disorder    Toe pain 09/06/2016   Past Surgical History:  Procedure Laterality Date   ABDOMINAL HYSTERECTOMY     BOWEL RESECTION N/A 07/09/2017   Procedure: SMALL BOWEL RESECTION;  Surgeon: Gladis Cough, MD;  Location: [facility];  Service: General;  Laterality: N/A;   CERVICAL FUSION     x2   CONVERSION TO TOTAL HIP Left 10/31/2020   Procedure: CONVERSION TO ANTERIOR TOTAL HIP;  Surgeon: Ernie Cough, MD;  Location: [facility];  Service: Orthopedics;  Laterality: Left;  2 hrs   FEMUR IM NAIL Left 03/03/2020   Procedure: INTRAMEDULLARY (IM) NAIL FEMORAL;  Surgeon: Ernie Cough, MD;  Location: [facility];  Service: Orthopedics;  Laterality: Left;   LAPAROSCOPIC APPENDECTOMY N/A 03/04/2017   Procedure: APPENDECTOMY LAPAROSCOPIC;  Surgeon: Cough Gladis, MD;  Location: [facility];  Service: General;  Laterality: N/A;   LAPAROSCOPY N/A 03/04/2017    Procedure: LAPAROSCOPY DIAGNOSTIC, ENTEROLYSIS;  Surgeon: Cough Gladis, MD;  Location: [facility];  Service: General;  Laterality: N/A;   LAPAROTOMY N/A 07/09/2017   Procedure: EXPLORATORY LAPAROTOMY;  Surgeon: Gladis Cough, MD;  Location: [facility];  Service: General;  Laterality: N/A;   SPINE SURGERY     Lumbar- rod placement   TONSILLECTOMY     89y/o   TOTAL VAGINAL HYSTERECTOMY      Allergies  Allergen Reactions   Codeine Rash   Penicillins Rash    Has patient had a PCN reaction causing immediate rash, facial/tongue/throat swelling, SOB or lightheadedness with hypotension: No Has patient had a PCN reaction causing severe rash involving mucus membranes or skin necrosis: No Has patient had a PCN reaction that required hospitalization: No Has patient had a PCN reaction occurring within the last 10 years: No If all of the above answers are NO, then may proceed with Cephalosporin use.  Tolerated Cephalosporin 10/31/20     Levaquin [Levofloxacin] Other (See Comments)    Allergic, per MAR   Tuberculin Tests Other (See Comments)    Allergic, per MAR   Tuberculin, Ppd Other (See Comments)    Allergic, per MAR    Allergies as of 10/06/2024       Reactions   Codeine Rash   Penicillins Rash   Has patient had a PCN reaction causing immediate rash, facial/tongue/throat swelling, SOB or lightheadedness with hypotension: No Has patient had a PCN reaction causing severe rash involving mucus membranes or skin necrosis: No Has patient had a PCN reaction that required hospitalization: No Has patient had a PCN reaction occurring within the last 10 years: No If all of the above answers are NO, then may proceed with Cephalosporin use. Tolerated Cephalosporin 10/31/20   Levaquin [levofloxacin] Other (See Comments)   Allergic, per MAR   Tuberculin Tests Other (See Comments)   Allergic, per MAR   Tuberculin, Ppd Other (See Comments)   Allergic, per [facility]        Medication List         Accurate as of October 06, 2024 11:59 PM. If you have any questions, ask your  nurse or doctor.          acetaminophen  500 MG tablet Commonly known as: TYLENOL  Take 1,000 mg by mouth 2 (two) times daily as needed (for pain).   aspirin  EC 81 MG tablet Take 81 mg by mouth at bedtime. Swallow whole.   cyanocobalamin 1000 MCG tablet Commonly known as: VITAMIN B12 Take 1,000 mcg by mouth daily.   gabapentin  100 MG capsule Commonly known as: NEURONTIN  Take 1 capsule (100 mg total) by mouth at bedtime.   levETIRAcetam  500 MG 24 hr tablet Commonly known as: Keppra  XR Take 1 tablet (500 mg total) by mouth daily.   levothyroxine  75 MCG tablet Commonly known as: SYNTHROID  Take 1 tablet (75 mcg total) by mouth daily before breakfast.   LORazepam  0.5 MG tablet Commonly known as: ATIVAN  Take 0.5 mg by mouth at bedtime.  Give 0.5 mg by mouth at bedtime related to ANXIETY DISORDER, UNSPECIFIED (F41.9)   Melatonin 5 MG Chew Chew 5 mg by mouth at bedtime.   meloxicam  7.5 MG tablet Commonly known as: MOBIC  Take 1 tablet (7.5 mg total) by mouth daily.   Multivitamin Tabs Take 1 tablet by mouth daily with breakfast.   omeprazole 20 MG capsule Commonly known as: PRILOSEC Take 20 mg by mouth daily as needed (for indigestion/upset stomach).   OXYGEN Inhale 2 L/min into the lungs as needed (TO KEEP SATS AT >90%).   Oyster Shell 500 MG Tabs Take 500 mg by mouth daily.   polyethylene glycol 17 g packet Commonly known as: MIRALAX  / GLYCOLAX  Take 17 g by mouth daily as needed for mild constipation.   propranolol 10 MG tablet Commonly known as: INDERAL Take 10 mg by mouth 2 (two) times daily.   sennosides-docusate sodium  8.6-50 MG tablet Commonly known as: SENOKOT-S Take 1-2 tablets by mouth daily as needed for constipation.   sodium fluoride  1.1 % Gel dental gel Commonly known as: FLUORISHIELD Place 1 Application onto teeth in the morning.        Review of  Systems  Constitutional:  Positive for appetite change and fatigue. Negative for fever.       Gradual weight loss  HENT:  Positive for hearing loss. Negative for congestion and trouble swallowing.   Eyes:  Negative for visual disturbance.  Respiratory:  Negative for cough and shortness of breath.        DOE  Cardiovascular:  Negative for chest pain, palpitations and leg swelling.  Gastrointestinal:  Negative for abdominal pain and constipation.  Genitourinary:  Positive for dysuria and frequency. Negative for urgency.       Bathroom trips 3-4x/night  Musculoskeletal:  Positive for arthralgias, back pain and gait problem.       Left hip pain, s/p total left hip arthroplasty. Multiple sites aches in am, toes.  Chronic back pain  Skin:  Negative for color change.  Neurological:  Negative for tremors, seizures and weakness.       Restless legs at night.  Memory lapses.   Psychiatric/Behavioral:  Negative for behavioral problems and sleep disturbance. The patient is not nervous/anxious.     Immunization History  Administered Date(s) Administered   Fluzone Influenza virus vaccine,trivalent (IIV3), split virus 08/11/2018, 08/03/2019   INFLUENZA, HIGH DOSE SEASONAL PF 09/01/2023   Influenza-Unspecified 09/01/2018, 09/10/2020, 09/18/2021, 09/21/2022   Moderna Covid-19 Fall Seasonal Vaccine 68yrs & older 09/15/2023   Moderna Sars-Covid-2 Vaccination 12/04/2019, 01/01/2020   Pfizer Covid-19 Vaccine Bivalent Booster 8yrs & up 10/01/2022   Pneumococcal Conjugate-13 05/29/2014  Pneumococcal Polysaccharide-23 06/14/2015   Tdap 09/14/2012   Zoster Recombinant(Shingrix) 07/05/2018, 02/10/2022, 05/11/2022   Zoster, Live 04/30/2004, 04/13/2018, 07/05/2018   Pertinent  Health Maintenance Due  Topic Date Due   DEXA SCAN  Never done   Influenza Vaccine  06/30/2024      11/04/2020   12:00 PM 04/23/2021   11:34 AM 12/08/2022    9:52 AM 12/11/2022    2:50 PM 05/19/2023    9:23 AM  Fall Risk  Falls  in the past year?   0 0 1  Was there an injury with Fall?   0 0 1  Fall Risk Category Calculator   0 0 2  Fall Risk Category (Retired)   Low  Low    (RETIRED) Patient Fall Risk Level High fall risk  Moderate fall risk  Low fall risk  Low fall risk    Patient at Risk for Falls Due to   No Fall Risks No Fall Risks History of fall(s);Impaired balance/gait;Impaired mobility  Fall risk Follow up   Falls evaluation completed  Falls evaluation completed  Falls evaluation completed     Data saved with a previous flowsheet row definition   Functional Status Survey:    Vitals:   10/06/24 1310  BP: (!) 140/75  Pulse: 73  Resp: 18  Temp: 97.9 F (36.6 C)  SpO2: 95%  Weight: 100 lb 14.4 oz (45.8 kg)   Body mass index is 19.71 kg/m. Physical Exam Constitutional:      Comments: frail  HENT:     Head: Normocephalic and atraumatic.     Mouth/Throat:     Mouth: Mucous membranes are moist.  Eyes:     Extraocular Movements: Extraocular movements intact.     Pupils: Pupils are equal, round, and reactive to light.  Cardiovascular:     Rate and Rhythm: Normal rate and regular rhythm.     Heart sounds: No murmur heard.    Comments: Weak left DP pulse, more symptomatic tingling sensation at night.  Pulmonary:     Effort: Pulmonary effort is normal.     Breath sounds: Rales present.     Comments: Fine rales R lung base Abdominal:     General: Bowel sounds are normal.     Palpations: Abdomen is soft.     Tenderness: There is no abdominal tenderness.  Musculoskeletal:     Cervical back: Normal range of motion and neck supple.     Right lower leg: No edema.     Left lower leg: No edema.     Comments: mild scoliosis. Hx of back surgeries x3 with left sciatica pain. s/p total left hip replacement. R SIJ pain, positional     Skin:    General: Skin is warm and dry.  Neurological:     General: No focal deficit present.     Mental Status: She is alert and oriented to person, place, and time.  Mental status is at baseline.     Motor: No weakness.     Gait: Gait abnormal.     Comments: Intentional tremor in hands, difficulty feeding self-much improved on Propranolol.   Psychiatric:        Mood and Affect: Mood normal.        Behavior: Behavior normal.        Thought Content: Thought content normal.        Judgment: Judgment normal.     Labs reviewed: Recent Labs    06/29/24 1037 06/30/24 0429 07/13/24 1459 08/22/24  0000 08/29/24 2250  NA 132* 127* 132* 132* 131*  K 3.6 3.2* 4.8 4.3 4.0  CL 98 100 99 100 95*  CO2 21* 24 26* 26* 23  GLUCOSE 133* 97  --   --  130*  BUN 12 7* 10 14 17   CREATININE 0.52 0.45 0.6 0.6 0.72  CALCIUM  9.2 8.6* 9.8 9.9 10.0   Recent Labs    06/20/24 0000 06/29/24 1037 08/29/24 2250  AST 17 25 33  ALT 9 14 30   ALKPHOS 67 61 85  BILITOT  --  0.9 0.8  PROT  --  8.0 7.3  ALBUMIN  4.4 4.3 4.2   Recent Labs    05/11/24 0000 06/02/24 1729 06/20/24 0000 06/29/24 1037 06/30/24 0429 08/29/24 2250  WBC 6.1   < > 13.3 8.1 8.2 12.0*  NEUTROABS 3,569.00  --  10,800.00 7.1  --   --   HGB 11.8*   < > 12.6 12.9 11.7* 14.3  HCT 36   < > 37 39.9 35.5* 42.4  MCV  --    < >  --  96.4 98.3 96.8  PLT 441*   < > 457* 550* 342 314   < > = values in this interval not displayed.   Lab Results  Component Value Date   TSH 0.931 06/29/2024   Lab Results  Component Value Date   HGBA1C 5.8 (H) 10/21/2020   Lab Results  Component Value Date   CHOL 171 06/20/2020   HDL 59 06/20/2020   LDLCALC 94 06/20/2020   TRIG 89 06/20/2020   CHOLHDL 2.9 06/20/2020    Significant Diagnostic Results in last 30 days:  No results found.  Assessment/Plan: Urinary tract infection C/o dysuria, noted generalized malaise Urine culture 10/01/24 E Coli 50-99,000c/ml Will treat with Nitrofurantoin 100mg  bid x 7 days.   Tremor of both hands  Tremor in hands, much better since Propranolol.   RLS takes Gabapentin , off Requip. Vit B12 818 03/25/23(taking Vit  B12)  Seizures (HCC) saw neurology, hx of recurrent the left sided weakness. EEG showed focal seizure right frontal region. Takes Keppra  since 07/23/20.  Hypothyroidism  taking Levothyroxine , TSH 1.72 03/30/24  Hyponatremia stable, Na 133 10/03/24, declined Nacl in the past.   Insomnia secondary to anxiety stable, on  Lorazepam ,  failed Mirtazapine, Lexapro, Ambien .   GERD (gastroesophageal reflux disease) stable, taking Omeprazole,  Hgb 14.3 08/29/24  Slow transit constipation  takes MiraLax , Senokot S    Family/ staff Communication: plan of care reviewed with the patient and charge nurse.   Labs/tests ordered:  none

## 2024-10-06 NOTE — Assessment & Plan Note (Signed)
 Tremor in hands, much better since Propranolol.              RLS takes Gabapentin , off Requip. Vit B12 818 03/25/23(taking Vit B12)

## 2024-10-06 NOTE — Assessment & Plan Note (Signed)
 saw neurology, hx of recurrent the left sided weakness. EEG showed focal seizure right frontal region. Takes Keppra  since 07/23/20.

## 2024-10-06 NOTE — Assessment & Plan Note (Signed)
 stable, on  Lorazepam ,  failed Mirtazapine, Lexapro, Ambien .

## 2024-10-06 NOTE — Assessment & Plan Note (Signed)
 taking Levothyroxine , TSH 1.72 03/30/24

## 2024-10-06 NOTE — Assessment & Plan Note (Signed)
 C/o dysuria, noted generalized malaise Urine culture 10/01/24 E Coli 50-99,000c/ml Will treat with Nitrofurantoin 100mg  bid x 7 days.

## 2024-10-06 NOTE — Assessment & Plan Note (Signed)
takes MiraLax, Senokot S 

## 2024-10-06 NOTE — Assessment & Plan Note (Signed)
 stable, taking Omeprazole,  Hgb 14.3 08/29/24

## 2024-10-17 ENCOUNTER — Other Ambulatory Visit: Payer: Self-pay | Admitting: Nurse Practitioner

## 2024-10-17 MED ORDER — LORAZEPAM 0.5 MG PO TABS: 0.5000 mg | ORAL_TABLET | Freq: Every day | ORAL | 0 refills | Status: DC

## 2024-10-17 MED ORDER — LORAZEPAM 0.5 MG PO TABS: 0.5000 mg | ORAL_TABLET | Freq: Every evening | ORAL | 0 refills | Status: DC

## 2024-10-20 ENCOUNTER — Non-Acute Institutional Stay (SKILLED_NURSING_FACILITY): Payer: Self-pay | Admitting: Family Medicine

## 2024-10-20 DIAGNOSIS — R54 Age-related physical debility: Secondary | ICD-10-CM

## 2024-10-20 DIAGNOSIS — R413 Other amnesia: Secondary | ICD-10-CM

## 2024-10-20 DIAGNOSIS — R5381 Other malaise: Secondary | ICD-10-CM

## 2024-10-20 DIAGNOSIS — G8929 Other chronic pain: Secondary | ICD-10-CM

## 2024-10-20 NOTE — Assessment & Plan Note (Signed)
 Arthritis is fairly severe left knee with bone-on-bone historically

## 2024-10-20 NOTE — Assessment & Plan Note (Signed)
 And spending some time with her talking today she gives pretty good history.  1 would expect some memory loss at her age

## 2024-10-20 NOTE — Assessment & Plan Note (Signed)
 Even though her weight is stable she does exhibit some symptoms consistent with frailty.  She tells me that she knows she will die in this place and that she is ready but she does smile in her conversation and take some comfort in her living here.

## 2024-10-20 NOTE — Assessment & Plan Note (Signed)
 Patient does not ambulate with walker and spends most of his time in her wheelchair.  Decreased ambulation related to knee pain and lack of confidence in ability to walk

## 2024-10-20 NOTE — Progress Notes (Signed)
 Provider:  Garnette Pinal, MD Location:      Place of Service:     PCP: Sherlynn Madden, MD Patient Care Team: Sherlynn Madden, MD as PCP - General (Internal Medicine)  Extended Emergency Contact Information Primary Emergency Contact: Hertzwig,Kevin Address: [address]          [address]          [place], [place] [address] [place] Home Phone: [phone] Mobile Phone: [phone] Relation: Son Secondary Emergency Contact: Hertzwig,Eric Address: [address] [place] , [place] [address] [place] Home Phone: [phone] Mobile Phone: [phone] Relation: Son  Code Status:  Goals of Care: Advanced Directive information    08/29/2024    9:51 PM  Advanced Directives  Does Patient Have a Medical Advance Directive? Yes  Type of Advance Directive Out of facility DNR (pink MOST or yellow form)      No chief complaint on file.   HPI: Patient is a 89 y.o. female seen today for medical management of chronic problems including: History of TIA, hypertension, history of CVA, reticular disease, hypothyroidism, tremor, seizure disorder, insomnia. I met with her in her room where she seems to spend most of the time.  She tells me she has been in skilled care for about 3 months.  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  She seems not to have much communication or interaction with other residents.  She eats in her room.  Excuses tremor in her hands.  Spends lots of time reading and watching TV.  She has 2 grown sons 1 who lives in [place]  and 1 in [place]  but no other family or family locally.  When asked about current problems he mentions only insomnia for which she takes lorazepam  and that is effective.  She does allude to some issues with her memory.  When asked about her past medical history she talks only about some arthritis in her knees but does not mention seizure disorder .  Past Medical History:  Diagnosis Date    Abdominal pain 02/26/2017   Anxiety    Arthritis    Left Knee, Wrist, Back and Hips   Cervical vertebral fusion    Diverticulitis    Diverticulosis    Fibromyalgia    GERD (gastroesophageal reflux disease)    Gout 02/2018   L hand   Hypertension     after d/c from hospital no current problems or medication   Hypothyroidism    Pelvis fracture (HCC)    Peritoneal free air 03/29/2012   Pneumonia    Pneumoperitoneum 02/26/2017   Pre-diabetes    Seizure (HCC)    Thyroid  disorder    Toe pain 09/06/2016   Past Surgical History:  Procedure Laterality Date   ABDOMINAL HYSTERECTOMY     BOWEL RESECTION N/A 07/09/2017   Procedure: SMALL BOWEL RESECTION;  Surgeon: Gladis Cough, MD;  Location: [facility];  Service: General;  Laterality: N/A;   CERVICAL FUSION     x2   CONVERSION TO TOTAL HIP Left 10/31/2020   Procedure: CONVERSION TO ANTERIOR TOTAL HIP;  Surgeon: Ernie Cough, MD;  Location: [facility];  Service: Orthopedics;  Laterality: Left;  2 hrs   FEMUR IM NAIL Left 03/03/2020   Procedure: INTRAMEDULLARY (IM) NAIL FEMORAL;  Surgeon: Ernie Cough, MD;  Location: [facility];  Service: Orthopedics;  Laterality: Left;   LAPAROSCOPIC APPENDECTOMY N/A 03/04/2017   Procedure: APPENDECTOMY LAPAROSCOPIC;  Surgeon: Donnice Lunger, MD;  Location: [facility];  Service: General;  Laterality: N/A;   LAPAROSCOPY N/A 03/04/2017   Procedure: LAPAROSCOPY DIAGNOSTIC, ENTEROLYSIS;  Surgeon: Donnice Lunger, MD;  Location: [facility];  Service: General;  Laterality: N/A;   LAPAROTOMY N/A 07/09/2017   Procedure: EXPLORATORY LAPAROTOMY;  Surgeon: Lunger Donnice, MD;  Location: [facility];  Service: General;  Laterality: N/A;   SPINE SURGERY     Lumbar- rod placement   TONSILLECTOMY     89y/o   TOTAL VAGINAL HYSTERECTOMY      reports that she has quit smoking. She has never used smokeless tobacco. She reports that she does not currently use alcohol. She reports that she does not use drugs. Social History   Socioeconomic  History   Marital status: Widowed    Spouse name: Not on file   Number of children: 2   Years of education: RN   Highest education level: Not on file  Occupational History   Occupation: Retired   Tobacco Use   Smoking status: Former   Smokeless tobacco: Never   Tobacco comments:    Smoked from age [age]  Vaping Use   Vaping status: Never Used  Substance and Sexual Activity   Alcohol use: Not Currently    Comment: occasional glass of wine   Drug use: No   Sexual activity: Not Currently  Other Topics Concern   Not on file  Social History Narrative   Lives at [facility] independent living    Caffeine use: occasional cup of coffee, 2-3 times a week   Left handed         Diet: Regular      Do you drink/ eat things with caffeine? No      Marital status: Widowed                              What year were you married ? 1986      Do you live in a house, apartment,assistred living, condo, trailer, etc.)? Independent Living Home       Is it one or more stories? 4 th floor      How many persons live in your home ? 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  Yes                            Type & how often 5-6 x WK      ADVANCED DIRECTIVES (Please bring copies)      Do you have a living will? Yes      Do you have a DNR form?   Yes                    If not, do you want to discuss one?       Do you have signed POA?HPOA forms?   Yes              If so, please bring to your appointment      FUNCTIONAL STATUS- To be completed by Spouse / child / Staff       Do you have difficulty bathing or dressing yourself ?  No      Do you have difficulty preparing food or eating ?  No      Do you have difficulty managing your mediation ?No      Do you have difficulty managing your finances ? No      Do you have difficulty affording your  medication ? No      Social Drivers of Corporate Investment Banker Strain: Not on file  Food Insecurity: No Food Insecurity (06/29/2024)   Hunger Vital Sign    Worried About Running Out of Food in the Last Year: Never true    Ran Out of Food in the Last Year: Never true  Transportation Needs: No Transportation Needs (06/29/2024)   PRAPARE - Administrator, Civil Service (Medical): No    Lack of Transportation (Non-Medical): No  Physical Activity: Not on file  Stress: Not on file  Social Connections: Unknown (06/29/2024)   Social Connection and Isolation Panel    Frequency of Communication with Friends and Family: Once a week    Frequency of Social Gatherings with Friends and Family: Once a week    Attends Religious Services: Never    Database Administrator or Organizations: No    Attends Banker Meetings: Never    Marital Status: Patient declined  Catering Manager Violence: Not At Risk (06/29/2024)   Humiliation, Afraid, Rape, and Kick questionnaire    Fear of Current or Ex-Partner: No    Emotionally Abused: No    Physically Abused: No    Sexually Abused: No    Functional Status Survey:    Family History  Problem Relation Age of Onset   Asthma Mother    Pulmonary embolism Mother    Macular degeneration Mother    Heart disease Father    Stroke Father    Stroke Paternal Uncle    Breast cancer Maternal Aunt    Macular degeneration Sister    Stroke Sister    Neuropathy Neg Hx     Health Maintenance  Topic Date Due   Bone Density Scan  Never done   DTaP/Tdap/Td (2 - Td or Tdap) 09/14/2022   Influenza Vaccine  06/30/2024   COVID-19 Vaccine (5 - 2025-26 season) 07/31/2024   Medicare Annual Wellness (AWV)  12/22/2024   Pneumococcal Vaccine: 50+ Years  Completed   Zoster Vaccines- Shingrix  Completed   Meningococcal B Vaccine  Aged Out    Allergies  Allergen Reactions   Codeine Rash   Penicillins Rash    Has patient had a PCN reaction  causing immediate rash, facial/tongue/throat swelling, SOB or lightheadedness with hypotension: No Has patient had a PCN reaction causing severe rash involving mucus membranes or skin necrosis: No Has patient had a PCN reaction that required hospitalization: No Has patient had a PCN reaction occurring within the last 10 years: No If all of the above answers are NO, then may proceed with Cephalosporin use.  Tolerated Cephalosporin 10/31/20     Levaquin [Levofloxacin] Other (See Comments)    Allergic, per MAR   Tuberculin Tests Other (See Comments)    Allergic, per MAR   Tuberculin, Ppd Other (See Comments)    Allergic, per [facility]    Outpatient Encounter Medications as of 10/20/2024  Medication Sig   acetaminophen  (TYLENOL ) 500 MG tablet Take 1,000 mg by mouth 2 (two) times daily as needed (for pain).   aspirin  EC 81 MG tablet Take 81 mg by mouth at bedtime. Swallow whole.  cyanocobalamin (VITAMIN B12) 1000 MCG tablet Take 1,000 mcg by mouth daily.   gabapentin  (NEURONTIN ) 100 MG capsule Take 1 capsule (100 mg total) by mouth at bedtime.   levETIRAcetam  (KEPPRA  XR) 500 MG 24 hr tablet Take 1 tablet (500 mg total) by mouth daily.   levothyroxine  (SYNTHROID ) 75 MCG tablet Take 1 tablet (75 mcg total) by mouth daily before breakfast.   LORazepam  (ATIVAN ) 0.5 MG tablet Take 1 tablet (0.5 mg total) by mouth at bedtime.   Melatonin 5 MG CHEW Chew 5 mg by mouth at bedtime.   meloxicam  (MOBIC ) 7.5 MG tablet Take 1 tablet (7.5 mg total) by mouth daily.   Multiple Vitamin (MULTIVITAMIN) TABS Take 1 tablet by mouth daily with breakfast.   omeprazole (PRILOSEC) 20 MG capsule Take 20 mg by mouth daily as needed (for indigestion/upset stomach).   OXYGEN Inhale 2 L/min into the lungs as needed (TO KEEP SATS AT >90%).   Oyster Shell 500 MG TABS Take 500 mg by mouth daily.   polyethylene glycol (MIRALAX  / GLYCOLAX ) 17 g packet Take 17 g by mouth daily as needed for mild constipation.   propranolol  (INDERAL) 10 MG tablet Take 10 mg by mouth 2 (two) times daily.   sennosides-docusate sodium  (SENOKOT-S) 8.6-50 MG tablet Take 1-2 tablets by mouth daily as needed for constipation.   sodium fluoride  (FLUORISHIELD) 1.1 % GEL dental gel Place 1 Application onto teeth in the morning.   No facility-administered encounter medications on file as of 10/20/2024.    Review of Systems  Constitutional:  Positive for fatigue.  HENT: Negative.    Respiratory: Negative.    Cardiovascular: Negative.   Gastrointestinal:  Positive for constipation.  Genitourinary: Negative.   Musculoskeletal:  Positive for arthralgias, back pain and gait problem.    There were no vitals filed for this visit. There is no height or weight on file to calculate BMI. Physical Exam Vitals and nursing note reviewed.  Constitutional:      Appearance: Normal appearance.  HENT:     Head: Normocephalic.  Cardiovascular:     Rate and Rhythm: Normal rate and regular rhythm.  Pulmonary:     Effort: Pulmonary effort is normal.     Breath sounds: Normal breath sounds.  Abdominal:     Palpations: Abdomen is soft.  Musculoskeletal:     Cervical back: Normal range of motion.     Comments: Left knee pain with palpation  Neurological:     General: No focal deficit present.     Mental Status: She is alert and oriented to person, place, and time.  Psychiatric:        Mood and Affect: Mood normal.     Labs reviewed: Basic Metabolic Panel: Recent Labs    06/29/24 1037 06/30/24 0429 07/13/24 1459 08/22/24 0000 08/29/24 2250  NA 132* 127* 132* 132* 131*  K 3.6 3.2* 4.8 4.3 4.0  CL 98 100 99 100 95*  CO2 21* 24 26* 26* 23  GLUCOSE 133* 97  --   --  130*  BUN 12 7* 10 14 17   CREATININE 0.52 0.45 0.6 0.6 0.72  CALCIUM  9.2 8.6* 9.8 9.9 10.0   Liver Function Tests: Recent Labs    06/20/24 0000 06/29/24 1037 08/29/24 2250  AST 17 25 33  ALT 9 14 30   ALKPHOS 67 61 85  BILITOT  --  0.9 0.8  PROT  --  8.0 7.3   ALBUMIN  4.4 4.3 4.2   No results for input(s):  LIPASE, AMYLASE in the last 8760 hours. No results for input(s): AMMONIA in the last 8760 hours. CBC: Recent Labs    05/11/24 0000 06/02/24 1729 06/20/24 0000 06/29/24 1037 06/30/24 0429 08/29/24 2250  WBC 6.1   < > 13.3 8.1 8.2 12.0*  NEUTROABS 3,569.00  --  10,800.00 7.1  --   --   HGB 11.8*   < > 12.6 12.9 11.7* 14.3  HCT 36   < > 37 39.9 35.5* 42.4  MCV  --    < >  --  96.4 98.3 96.8  PLT 441*   < > 457* 550* 342 314   < > = values in this interval not displayed.   Cardiac Enzymes: Recent Labs    06/02/24 1730  CKTOTAL 106   BNP: Invalid input(s): POCBNP Lab Results  Component Value Date   HGBA1C 5.8 (H) 10/21/2020   Lab Results  Component Value Date   TSH 0.931 06/29/2024   Lab Results  Component Value Date   VITAMINB12 1,034 09/02/2023   Lab Results  Component Value Date   FOLATE 19.8 12/01/2017   No results found for: IRON, TIBC, FERRITIN  Imaging and Procedures obtained prior to SNF admission: CT Head Wo Contrast Result Date: 08/29/2024 CLINICAL DATA:  Mental status change, unknown cause EXAM: CT HEAD WITHOUT CONTRAST TECHNIQUE: Contiguous axial images were obtained from the base of the skull through the vertex without intravenous contrast. RADIATION DOSE REDUCTION: This exam was performed according to the departmental dose-optimization program which includes automated exposure control, adjustment of the mA and/or kV according to patient size and/or use of iterative reconstruction technique. COMPARISON:  Head CT 06/02/2024 FINDINGS: Brain: No intracranial hemorrhage, mass effect, or midline shift. Stable degree of atrophy and chronic small vessel ischemia. Remote lacunar infarct in the right caudate. No hydrocephalus. The basilar cisterns are patent. No evidence of territorial infarct or acute ischemia. No extra-axial or intracranial fluid collection. Vascular: Atherosclerosis of skullbase  vasculature without hyperdense vessel or abnormal calcification. Skull: No fracture or focal lesion. Sinuses/Orbits: No acute finding. Other: None. IMPRESSION: 1. No acute intracranial abnormality. 2. Stable atrophy and chronic small vessel ischemia. Electronically Signed   By: Andrea Gasman M.D.   On: 08/29/2024 23:34   DG Chest Portable 1 View Result Date: 08/29/2024 CLINICAL DATA:  Fall EXAM: PORTABLE CHEST 1 VIEW COMPARISON:  06/29/2024 FINDINGS: Hardware in the cervical spine. Evidence of prior granulomatous disease. Stable cardiomediastinal silhouette. No pneumothorax IMPRESSION: No active disease. Evidence of prior granulomatous disease. Electronically Signed   By: Luke Bun M.D.   On: 08/29/2024 22:22    Assessment/Plan Physical deconditioning Patient does not ambulate with walker and spends most of his time in her wheelchair.  Decreased ambulation related to knee pain and lack of confidence in ability to walk  Memory deficit And spending some time with her talking today she gives pretty good history.  1 would expect some memory loss at her age  Left knee pain Arthritis is fairly severe left knee with bone-on-bone historically  Frailty syndrome in geriatric patient Even though her weight is stable she does exhibit some symptoms consistent with frailty.  She tells me that she knows she will die in this place and that she is ready but she does smile in her conversation and take some comfort in her living here.    Family/ staff Communication:   Labs/tests ordered: none  .[email or web address]

## 2024-11-14 ENCOUNTER — Other Ambulatory Visit: Payer: Self-pay | Admitting: Nurse Practitioner

## 2024-11-14 MED ORDER — LORAZEPAM 0.5 MG PO TABS: 0.5000 mg | ORAL_TABLET | Freq: Every day | ORAL | 1 refills | Status: DC

## 2024-11-20 ENCOUNTER — Encounter: Payer: Self-pay | Admitting: Nurse Practitioner

## 2024-11-20 ENCOUNTER — Non-Acute Institutional Stay (SKILLED_NURSING_FACILITY): Payer: Self-pay | Admitting: Nurse Practitioner

## 2024-11-20 DIAGNOSIS — R350 Frequency of micturition: Secondary | ICD-10-CM | POA: Diagnosis not present

## 2024-11-20 DIAGNOSIS — R569 Unspecified convulsions: Secondary | ICD-10-CM

## 2024-11-20 DIAGNOSIS — F419 Anxiety disorder, unspecified: Secondary | ICD-10-CM

## 2024-11-20 DIAGNOSIS — G459 Transient cerebral ischemic attack, unspecified: Secondary | ICD-10-CM

## 2024-11-20 DIAGNOSIS — K219 Gastro-esophageal reflux disease without esophagitis: Secondary | ICD-10-CM

## 2024-11-20 DIAGNOSIS — R251 Tremor, unspecified: Secondary | ICD-10-CM

## 2024-11-20 DIAGNOSIS — F5105 Insomnia due to other mental disorder: Secondary | ICD-10-CM

## 2024-11-20 DIAGNOSIS — K5901 Slow transit constipation: Secondary | ICD-10-CM

## 2024-11-20 DIAGNOSIS — M150 Primary generalized (osteo)arthritis: Secondary | ICD-10-CM | POA: Diagnosis not present

## 2024-11-20 DIAGNOSIS — R627 Adult failure to thrive: Secondary | ICD-10-CM

## 2024-11-20 NOTE — Progress Notes (Signed)
 " Location:   [facility] Room Number: 42 Place of Service:  SNF (31) Provider: Larwance Hermelinda Diegel NP  Sherlynn Madden, MD  Patient Care Team: Sherlynn Madden, MD as PCP - General (Internal Medicine)  Extended Emergency Contact Information Primary Emergency Contact: Hertzwig,Kevin Address: [address]          [address]          [place], [place] [address] [place] Home Phone: [phone] Mobile Phone: [phone] Relation: Son Secondary Emergency Contact: Hertzwig,Eric Address: [address] [place] , [place] [address] [place] Home Phone: [phone] Mobile Phone: [phone] Relation: Son  Code Status:  DNR Goals of care: Advanced Directive information    08/29/2024    9:51 PM  Advanced Directives  Does Patient Have a Medical Advance Directive? Yes  Type of Advance Directive Out of facility DNR (pink MOST or yellow form)     Chief Complaint  Patient presents with   Medical Management of Chronic Issues    HPI:  Pt is a 89 y.o. female seen today for medical management of chronic diseases.      Failure to thrive, supportive care in SNF [facility], comfort measures.              Edema BLE, not apparent, 09/11/24 off  Furosemide , 06/20/20 EF 60-65%             Tremor in hands, much better since Propranolol.              RLS takes Gabapentin , off Requip. Vit B12 818 03/25/23(taking Vit B12)             Seizure, saw neurology, hx of recurrent the left sided weakness. EEG showed focal seizure right frontal region. Takes Keppra  since 07/23/20.             Hypothyroidism, taking Levothyroxine , TSH 1.72 03/30/24             Hyponatremia, stable, Na 133 10/03/24, declined Nacl in the past.              Hypokalemia, K 4.0 08/29/24, on potassium supplement             Anxiety, stable, on  Lorazepam ,  failed Mirtazapine, Lexapro, Ambien .              GERD, stable, taking Omeprazole,  Hgb 14.3 08/29/24             Constipation, takes  MiraLax , Senokot S             OA, left hip, s/p total left hip, multiple sites, takes Mobic , Gabapentin  for pain in toes, failed Methocarbamol  recommended by Ortho-felt wobbly.              OP off Boniva, Ca, Vit D, declined DEXA, Vit D 16 10/07/21.              TIA, x2, off  Atorvastatin  per patient's request, on ASA.  Resolved left arm weakness 11/18/2024, suspected TIA, decline ED eval.  MOST: Comfort measures             Urinary frequency, prolapsed bladder, chronic,  declined Myrbetriq 2/2 to the cost.   Past Medical History:  Diagnosis Date   Abdominal pain 02/26/2017   Anxiety    Arthritis    Left Knee, Wrist, Back and Hips   Cervical vertebral fusion    Diverticulitis    Diverticulosis  Fibromyalgia    GERD (gastroesophageal reflux disease)    Gout 02/2018   L hand   Hypertension     after d/c from hospital no current problems or medication   Hypothyroidism    Pelvis fracture (HCC)    Peritoneal free air 03/29/2012   Pneumonia    Pneumoperitoneum 02/26/2017   Pre-diabetes    Seizure (HCC)    Thyroid  disorder    Toe pain 09/06/2016   Past Surgical History:  Procedure Laterality Date   ABDOMINAL HYSTERECTOMY     BOWEL RESECTION N/A 07/09/2017   Procedure: SMALL BOWEL RESECTION;  Surgeon: Gladis Cough, MD;  Location: [facility];  Service: General;  Laterality: N/A;   CERVICAL FUSION     x2   CONVERSION TO TOTAL HIP Left 10/31/2020   Procedure: CONVERSION TO ANTERIOR TOTAL HIP;  Surgeon: Ernie Cough, MD;  Location: [facility];  Service: Orthopedics;  Laterality: Left;  2 hrs   FEMUR IM NAIL Left 03/03/2020   Procedure: INTRAMEDULLARY (IM) NAIL FEMORAL;  Surgeon: Ernie Cough, MD;  Location: [facility];  Service: Orthopedics;  Laterality: Left;   LAPAROSCOPIC APPENDECTOMY N/A 03/04/2017   Procedure: APPENDECTOMY LAPAROSCOPIC;  Surgeon: Cough Gladis, MD;  Location: [facility];  Service: General;  Laterality: N/A;   LAPAROSCOPY N/A 03/04/2017   Procedure:  LAPAROSCOPY DIAGNOSTIC, ENTEROLYSIS;  Surgeon: Cough Gladis, MD;  Location: [facility];  Service: General;  Laterality: N/A;   LAPAROTOMY N/A 07/09/2017   Procedure: EXPLORATORY LAPAROTOMY;  Surgeon: Gladis Cough, MD;  Location: [facility];  Service: General;  Laterality: N/A;   SPINE SURGERY     Lumbar- rod placement   TONSILLECTOMY     89y/o   TOTAL VAGINAL HYSTERECTOMY      Allergies[1]  Allergies as of 11/20/2024       Reactions   Codeine Rash   Penicillins Rash   Has patient had a PCN reaction causing immediate rash, facial/tongue/throat swelling, SOB or lightheadedness with hypotension: No Has patient had a PCN reaction causing severe rash involving mucus membranes or skin necrosis: No Has patient had a PCN reaction that required hospitalization: No Has patient had a PCN reaction occurring within the last 10 years: No If all of the above answers are NO, then may proceed with Cephalosporin use. Tolerated Cephalosporin 10/31/20   Levaquin [levofloxacin] Other (See Comments)   Allergic, per MAR   Tuberculin Tests Other (See Comments)   Allergic, per MAR   Tuberculin, Ppd Other (See Comments)   Allergic, per [facility]        Medication List        Accurate as of November 20, 2024  4:09 PM. If you have any questions, ask your nurse or doctor.          acetaminophen  500 MG tablet Commonly known as: TYLENOL  Take 1,000 mg by mouth 2 (two) times daily as needed (for pain).   aspirin  EC 81 MG tablet Take 81 mg by mouth at bedtime. Swallow whole.   cyanocobalamin 1000 MCG tablet Commonly known as: VITAMIN B12 Take 1,000 mcg by mouth daily.   gabapentin  100 MG capsule Commonly known as: NEURONTIN  Take 1 capsule (100 mg total) by mouth at bedtime.   levETIRAcetam  500 MG 24 hr tablet Commonly known as: Keppra  XR Take 1 tablet (500 mg total) by mouth daily.   levothyroxine  75 MCG tablet Commonly known as: SYNTHROID  Take 1 tablet (75 mcg total) by mouth daily  before breakfast.   LORazepam  0.5 MG tablet Commonly known as:  Ativan  Take 1 tablet (0.5 mg total) by mouth at bedtime.   Melatonin 5 MG Chew Chew 5 mg by mouth at bedtime.   meloxicam  7.5 MG tablet Commonly known as: MOBIC  Take 1 tablet (7.5 mg total) by mouth daily.   Multivitamin Tabs Take 1 tablet by mouth daily with breakfast.   omeprazole 20 MG capsule Commonly known as: PRILOSEC Take 20 mg by mouth daily as needed (for indigestion/upset stomach).   OXYGEN Inhale 2 L/min into the lungs as needed (TO KEEP SATS AT >90%).   Oyster Shell 500 MG Tabs Take 500 mg by mouth daily.   polyethylene glycol 17 g packet Commonly known as: MIRALAX  / GLYCOLAX  Take 17 g by mouth daily as needed for mild constipation.   propranolol 10 MG tablet Commonly known as: INDERAL Take 10 mg by mouth 2 (two) times daily.   sennosides-docusate sodium  8.6-50 MG tablet Commonly known as: SENOKOT-S Take 1-2 tablets by mouth daily as needed for constipation.   sodium fluoride  1.1 % Gel dental gel Commonly known as: FLUORISHIELD Place 1 Application onto teeth in the morning.        Review of Systems  Constitutional:  Positive for appetite change and fatigue. Negative for fever.       Gradual weight loss  HENT:  Positive for hearing loss. Negative for congestion and trouble swallowing.   Eyes:  Negative for visual disturbance.  Respiratory:  Negative for cough and shortness of breath.        DOE  Cardiovascular:  Negative for chest pain, palpitations and leg swelling.  Gastrointestinal:  Negative for abdominal pain and constipation.  Genitourinary:  Positive for frequency. Negative for dysuria and urgency.       Bathroom trips 3-4x/night  Musculoskeletal:  Positive for arthralgias, back pain and gait problem.       Left hip pain, s/p total left hip arthroplasty. Multiple sites aches in am, toes.  Chronic back pain  Skin:  Negative for color change.  Neurological:  Negative for  tremors, seizures and weakness.       Restless legs at night.  Memory lapses.   Psychiatric/Behavioral:  Negative for behavioral problems and sleep disturbance. The patient is not nervous/anxious.     Immunization History  Administered Date(s) Administered   Fluzone Influenza virus vaccine,trivalent (IIV3), split virus 08/11/2018, 08/03/2019   INFLUENZA, HIGH DOSE SEASONAL PF 09/01/2023   Influenza-Unspecified 09/01/2018, 09/10/2020, 09/18/2021, 09/21/2022   Moderna Covid-19 Fall Seasonal Vaccine 19yrs & older 09/15/2023   Moderna Sars-Covid-2 Vaccination 12/04/2019, 01/01/2020   Pfizer Covid-19 Vaccine Bivalent Booster 69yrs & up 10/01/2022   Pneumococcal Conjugate-13 05/29/2014   Pneumococcal Polysaccharide-23 06/14/2015   Tdap 09/14/2012   Zoster Recombinant(Shingrix) 07/05/2018, 02/10/2022, 05/11/2022   Zoster, Live 04/30/2004, 04/13/2018, 07/05/2018   Pertinent  Health Maintenance Due  Topic Date Due   Bone Density Scan  Never done   Influenza Vaccine  06/30/2024      11/04/2020   12:00 PM 04/23/2021   11:34 AM 12/08/2022    9:52 AM 12/11/2022    2:50 PM 05/19/2023    9:23 AM  Fall Risk  Falls in the past year?   0 0 1  Was there an injury with Fall?   0  0  1   Fall Risk Category Calculator   0 0 2  Fall Risk Category (Retired)   Low  Low    (RETIRED) Patient Fall Risk Level High fall risk  Moderate fall risk  Low fall risk  Low fall risk    Patient at Risk for Falls Due to   No Fall Risks No Fall Risks History of fall(s);Impaired balance/gait;Impaired mobility  Fall risk Follow up   Falls evaluation completed  Falls evaluation completed  Falls evaluation completed     Data saved with a previous flowsheet row definition   Functional Status Survey:    Vitals:   11/20/24 1314  BP: (!) 160/81  Pulse: 73  Resp: 18  Temp: (!) 97.3 F (36.3 C)  SpO2: 94%  Weight: 100 lb 9.6 oz (45.6 kg)   Body mass index is 19.65 kg/m. Physical Exam Constitutional:       Comments: frail  HENT:     Head: Normocephalic and atraumatic.     Mouth/Throat:     Mouth: Mucous membranes are moist.  Eyes:     Extraocular Movements: Extraocular movements intact.     Pupils: Pupils are equal, round, and reactive to light.  Cardiovascular:     Rate and Rhythm: Normal rate and regular rhythm.     Heart sounds: No murmur heard.    Comments: Weak left DP pulse, more symptomatic tingling sensation at night.  Pulmonary:     Effort: Pulmonary effort is normal.     Breath sounds: Rales present.     Comments: Fine rales R lung base Abdominal:     General: Bowel sounds are normal.     Palpations: Abdomen is soft.     Tenderness: There is no abdominal tenderness.  Musculoskeletal:     Cervical back: Normal range of motion and neck supple.     Right lower leg: No edema.     Left lower leg: No edema.     Comments: mild scoliosis. Hx of back surgeries x3 with left sciatica pain. s/p total left hip replacement. R SIJ pain, positional     Skin:    General: Skin is warm and dry.  Neurological:     General: No focal deficit present.     Mental Status: She is alert and oriented to person, place, and time. Mental status is at baseline.     Motor: No weakness.     Gait: Gait abnormal.     Comments: Intentional tremor in hands, difficulty feeding self-much improved on Propranolol.   Psychiatric:        Mood and Affect: Mood normal.        Behavior: Behavior normal.        Thought Content: Thought content normal.        Judgment: Judgment normal.     Labs reviewed: Recent Labs    06/29/24 1037 06/30/24 0429 07/13/24 1459 08/22/24 0000 08/29/24 2250  NA 132* 127* 132* 132* 131*  K 3.6 3.2* 4.8 4.3 4.0  CL 98 100 99 100 95*  CO2 21* 24 26* 26* 23  GLUCOSE 133* 97  --   --  130*  BUN 12 7* 10 14 17   CREATININE 0.52 0.45 0.6 0.6 0.72  CALCIUM  9.2 8.6* 9.8 9.9 10.0   Recent Labs    06/20/24 0000 06/29/24 1037 08/29/24 2250  AST 17 25 33  ALT 9 14 30    ALKPHOS 67 61 85  BILITOT  --  0.9 0.8  PROT  --  8.0 7.3  ALBUMIN  4.4 4.3 4.2   Recent Labs    05/11/24 0000 06/02/24 1729 06/20/24 0000 06/29/24 1037 06/30/24 0429 08/29/24 2250  WBC 6.1   < > 13.3 8.1 8.2 12.0*  NEUTROABS 3,569.00  --  10,800.00 7.1  --   --   HGB 11.8*   < > 12.6 12.9 11.7* 14.3  HCT 36   < > 37 39.9 35.5* 42.4  MCV  --    < >  --  96.4 98.3 96.8  PLT 441*   < > 457* 550* 342 314   < > = values in this interval not displayed.   Lab Results  Component Value Date   TSH 0.931 06/29/2024   Lab Results  Component Value Date   HGBA1C 5.8 (H) 10/21/2020   Lab Results  Component Value Date   CHOL 171 06/20/2020   HDL 59 06/20/2020   LDLCALC 94 06/20/2020   TRIG 89 06/20/2020   CHOLHDL 2.9 06/20/2020    Significant Diagnostic Results in last 30 days:  No results found.  Assessment/Plan  TIA (transient ischemic attack) TIA, x2, off  Atorvastatin  per patient's request, on ASA.  Resolved left arm weakness 11/18/2024, suspected TIA, decline ED eval.  MOST: Comfort measures  Urinary frequency  Urinary frequency, prolapsed bladder, chronic,  declined Myrbetriq 2/2 to the cost.   Osteoarthritis, multiple sites OA, left hip, s/p total left hip, multiple sites, takes Mobic , Gabapentin  for pain in toes, failed Methocarbamol  recommended by Ortho-felt wobbly  Slow transit constipation Stable, takes MiraLax , Senokot S  GERD (gastroesophageal reflux disease) stable, taking Omeprazole,  Hgb 14.3 08/29/24  Insomnia secondary to anxiety  stable, on  Lorazepam ,  failed Mirtazapine, Lexapro, Ambien .   Hypothyroidism  taking Levothyroxine , TSH 1.72 03/30/24  Seizures (HCC) saw neurology, hx of recurrent the left sided weakness. EEG showed focal seizure right frontal region. Takes Keppra  since 07/23/20.  Tremor of both hands Tremor in hands, much better since Propranolol.              RLS takes Gabapentin , off Requip. Vit B12 818 03/25/23(taking Vit  B12)  Adult failure to thrive Failure to thrive, supportive care in SNF [facility], comfort measures.    Family/ staff Communication: plan of care reviewed with the patient and charge   Labs/tests ordered:  none       [1] Allergies Allergen Reactions   Codeine Rash   Penicillins Rash    Has patient had a PCN reaction causing immediate rash, facial/tongue/throat swelling, SOB or lightheadedness with hypotension: No Has patient had a PCN reaction causing severe rash involving mucus membranes or skin necrosis: No Has patient had a PCN reaction that required hospitalization: No Has patient had a PCN reaction occurring within the last 10 years: No If all of the above answers are NO, then may proceed with Cephalosporin use.  Tolerated Cephalosporin 10/31/20     Levaquin [Levofloxacin] Other (See Comments)    Allergic, per MAR   Tuberculin Tests Other (See Comments)    Allergic, per MAR   Tuberculin, Ppd Other (See Comments)    Allergic, per [facility]  "

## 2024-11-20 NOTE — Assessment & Plan Note (Signed)
 taking Levothyroxine , TSH 1.72 03/30/24

## 2024-11-20 NOTE — Assessment & Plan Note (Signed)
Stable, takes MiraLax, Senokot S 

## 2024-11-20 NOTE — Assessment & Plan Note (Signed)
Urinary frequency, prolapsed bladder, chronic,  declined Myrbetriq 2/2 to the cost.

## 2024-11-20 NOTE — Assessment & Plan Note (Signed)
 stable, taking Omeprazole,  Hgb 14.3 08/29/24

## 2024-11-20 NOTE — Assessment & Plan Note (Signed)
 OA, left hip, s/p total left hip, multiple sites, takes Mobic , Gabapentin  for pain in toes, failed Methocarbamol  recommended by Ortho-felt wobbly

## 2024-11-20 NOTE — Assessment & Plan Note (Signed)
 saw neurology, hx of recurrent the left sided weakness. EEG showed focal seizure right frontal region. Takes Keppra  since 07/23/20.

## 2024-11-20 NOTE — Assessment & Plan Note (Signed)
 Tremor in hands, much better since Propranolol.              RLS takes Gabapentin , off Requip. Vit B12 818 03/25/23(taking Vit B12)

## 2024-11-20 NOTE — Assessment & Plan Note (Signed)
 Failure to thrive, supportive care in SNF [facility], comfort measures.  Hospice referral.

## 2024-11-20 NOTE — Assessment & Plan Note (Signed)
 TIA, x2, off  Atorvastatin  per patient's request, on ASA.  Resolved left arm weakness 11/18/2024, suspected TIA, decline ED eval.  MOST: Comfort measures

## 2024-11-20 NOTE — Assessment & Plan Note (Signed)
 stable, on  Lorazepam ,  failed Mirtazapine, Lexapro, Ambien .

## 2024-11-21 ENCOUNTER — Other Ambulatory Visit: Payer: Self-pay | Admitting: Nurse Practitioner

## 2024-11-21 MED ORDER — LORAZEPAM 0.5 MG PO TABS: 0.5000 mg | ORAL_TABLET | Freq: Every day | ORAL | 1 refills | Status: DC

## 2024-12-04 ENCOUNTER — Encounter: Payer: Self-pay | Admitting: Nurse Practitioner

## 2024-12-04 ENCOUNTER — Non-Acute Institutional Stay (SKILLED_NURSING_FACILITY): Payer: Self-pay | Admitting: Nurse Practitioner

## 2024-12-04 DIAGNOSIS — G2581 Restless legs syndrome: Secondary | ICD-10-CM

## 2024-12-04 DIAGNOSIS — G459 Transient cerebral ischemic attack, unspecified: Secondary | ICD-10-CM

## 2024-12-04 DIAGNOSIS — F419 Anxiety disorder, unspecified: Secondary | ICD-10-CM

## 2024-12-04 DIAGNOSIS — E039 Hypothyroidism, unspecified: Secondary | ICD-10-CM | POA: Diagnosis not present

## 2024-12-04 DIAGNOSIS — K5901 Slow transit constipation: Secondary | ICD-10-CM | POA: Diagnosis not present

## 2024-12-04 DIAGNOSIS — M8000XS Age-related osteoporosis with current pathological fracture, unspecified site, sequela: Secondary | ICD-10-CM

## 2024-12-04 DIAGNOSIS — R569 Unspecified convulsions: Secondary | ICD-10-CM

## 2024-12-04 DIAGNOSIS — M150 Primary generalized (osteo)arthritis: Secondary | ICD-10-CM | POA: Diagnosis not present

## 2024-12-04 DIAGNOSIS — R627 Adult failure to thrive: Secondary | ICD-10-CM | POA: Diagnosis not present

## 2024-12-04 DIAGNOSIS — K219 Gastro-esophageal reflux disease without esophagitis: Secondary | ICD-10-CM | POA: Diagnosis not present

## 2024-12-04 DIAGNOSIS — I872 Venous insufficiency (chronic) (peripheral): Secondary | ICD-10-CM

## 2024-12-04 DIAGNOSIS — E871 Hypo-osmolality and hyponatremia: Secondary | ICD-10-CM

## 2024-12-04 DIAGNOSIS — E876 Hypokalemia: Secondary | ICD-10-CM | POA: Diagnosis not present

## 2024-12-04 DIAGNOSIS — R350 Frequency of micturition: Secondary | ICD-10-CM

## 2024-12-04 NOTE — Assessment & Plan Note (Signed)
 TIA, x2, off  Atorvastatin  per patient's request, on ASA.  Resolved left arm weakness 11/18/2024, suspected TIA, decline ED eval.  MOST: Comfort measures

## 2024-12-04 NOTE — Assessment & Plan Note (Signed)
 Stable takes MiraLax , Senokot S

## 2024-12-04 NOTE — Progress Notes (Signed)
 " Location:   [facility] Room Number: 67 Place of Service:  SNF (31) Provider: Larwance Emanuel Campos NP  Sherlynn Madden, MD  Patient Care Team: Sherlynn Madden, MD as PCP - General (Internal Medicine)  Extended Emergency Contact Information Primary Emergency Contact: Hertzwig,Kevin Address: [address]          [address]          [place], [place] [address] [place] Home Phone: [phone] Mobile Phone: [phone] Relation: Son Secondary Emergency Contact: Hertzwig,Eric Address: [address] [place] , [place] [address] [place] Home Phone: [phone] Mobile Phone: [phone] Relation: Son  Code Status:  DNR Goals of care: Advanced Directive information    08/29/2024    9:51 PM  Advanced Directives  Does Patient Have a Medical Advance Directive? Yes  Type of Advance Directive Out of facility DNR (pink MOST or yellow form)     Chief Complaint  Patient presents with   Medical Management of Chronic Issues    HPI:  Pt is a 89 y.o. female seen today for medical management of chronic diseases.    Failure to thrive, supportive care in SNF [facility], comfort measures. Under Hospice service.              Edema BLE, not apparent, 09/11/24 off  Furosemide , 06/20/20 EF 60-65%             Tremor in hands, much better since Propranolol.              RLS takes Gabapentin , off Requip. Vit B12 818 03/25/23(taking Vit B12)             Seizure, saw neurology, hx of recurrent the left sided weakness. EEG showed focal seizure right frontal region. Takes Keppra  since 07/23/20.             Hypothyroidism, taking Levothyroxine , TSH 1.72 03/30/24             Hyponatremia, stable, Na 133 10/03/24, declined Nacl in the past.              Hypokalemia, K 4.0 08/29/24, on potassium supplement             Anxiety, stable, on  Lorazepam ,  failed Mirtazapine, Lexapro, Ambien .              GERD, stable, taking Omeprazole,  Hgb 14.3 08/29/24              Constipation, takes MiraLax , Senokot S             OA, left hip, s/p total left hip, multiple sites, takes Mobic , Gabapentin  for pain in toes, failed Methocarbamol  recommended by Ortho-felt wobbly.              OP off Boniva, Ca, Vit D, declined DEXA, Vit D 16 10/07/21.              TIA, x2, off  Atorvastatin  per patient's request, on ASA.  Resolved left arm weakness 11/18/2024, suspected TIA, decline ED eval.  MOST: Comfort measures             Urinary frequency, prolapsed bladder, chronic,  declined Myrbetriq 2/2 to the cost.    Past Medical History:  Diagnosis Date   Abdominal pain 02/26/2017   Anxiety    Arthritis    Left Knee, Wrist, Back and Hips   Cervical vertebral fusion    Diverticulitis  Diverticulosis    Fibromyalgia    GERD (gastroesophageal reflux disease)    Gout 02/2018   L hand   Hypertension     after d/c from hospital no current problems or medication   Hypothyroidism    Pelvis fracture (HCC)    Peritoneal free air 03/29/2012   Pneumonia    Pneumoperitoneum 02/26/2017   Pre-diabetes    Seizure (HCC)    Thyroid  disorder    Toe pain 09/06/2016   Past Surgical History:  Procedure Laterality Date   ABDOMINAL HYSTERECTOMY     BOWEL RESECTION N/A 07/09/2017   Procedure: SMALL BOWEL RESECTION;  Surgeon: Gladis Cough, MD;  Location: [facility];  Service: General;  Laterality: N/A;   CERVICAL FUSION     x2   CONVERSION TO TOTAL HIP Left 10/31/2020   Procedure: CONVERSION TO ANTERIOR TOTAL HIP;  Surgeon: Ernie Cough, MD;  Location: [facility];  Service: Orthopedics;  Laterality: Left;  2 hrs   FEMUR IM NAIL Left 03/03/2020   Procedure: INTRAMEDULLARY (IM) NAIL FEMORAL;  Surgeon: Ernie Cough, MD;  Location: [facility];  Service: Orthopedics;  Laterality: Left;   LAPAROSCOPIC APPENDECTOMY N/A 03/04/2017   Procedure: APPENDECTOMY LAPAROSCOPIC;  Surgeon: Cough Gladis, MD;  Location: [facility];  Service: General;  Laterality: N/A;   LAPAROSCOPY N/A 03/04/2017   Procedure:  LAPAROSCOPY DIAGNOSTIC, ENTEROLYSIS;  Surgeon: Cough Gladis, MD;  Location: [facility];  Service: General;  Laterality: N/A;   LAPAROTOMY N/A 07/09/2017   Procedure: EXPLORATORY LAPAROTOMY;  Surgeon: Gladis Cough, MD;  Location: [facility];  Service: General;  Laterality: N/A;   SPINE SURGERY     Lumbar- rod placement   TONSILLECTOMY     89y/o   TOTAL VAGINAL HYSTERECTOMY      Allergies[1]  Allergies as of 12/04/2024       Reactions   Codeine Rash   Penicillins Rash   Has patient had a PCN reaction causing immediate rash, facial/tongue/throat swelling, SOB or lightheadedness with hypotension: No Has patient had a PCN reaction causing severe rash involving mucus membranes or skin necrosis: No Has patient had a PCN reaction that required hospitalization: No Has patient had a PCN reaction occurring within the last 10 years: No If all of the above answers are NO, then may proceed with Cephalosporin use. Tolerated Cephalosporin 10/31/20   Levaquin [levofloxacin] Other (See Comments)   Allergic, per [facility]   Tuberculin Tests Other (See Comments)   Allergic, per [facility]   Tuberculin, Ppd Other (See Comments)   Allergic, per [facility]        Medication List        Accurate as of December 04, 2024 11:59 PM. If you have any questions, ask your nurse or doctor.          acetaminophen  500 MG tablet Commonly known as: TYLENOL  Take 1,000 mg by mouth 2 (two) times daily as needed (for pain).   aspirin  EC 81 MG tablet Take 81 mg by mouth at bedtime. Swallow whole.   cyanocobalamin 1000 MCG tablet Commonly known as: VITAMIN B12 Take 1,000 mcg by mouth daily.   gabapentin  100 MG capsule Commonly known as: NEURONTIN  Take 1 capsule (100 mg total) by mouth at bedtime.   levETIRAcetam  500 MG 24 hr tablet Commonly known as: Keppra  XR Take 1 tablet (500 mg total) by mouth daily.   levothyroxine  75 MCG tablet Commonly known as: SYNTHROID  Take 1 tablet (75 mcg total) by mouth daily before  breakfast.   LORazepam  0.5 MG tablet  Commonly known as: Ativan  Take 1 tablet (0.5 mg total) by mouth at bedtime.   Melatonin 5 MG Chew Chew 5 mg by mouth at bedtime.   meloxicam  7.5 MG tablet Commonly known as: MOBIC  Take 1 tablet (7.5 mg total) by mouth daily.   Multivitamin Tabs Take 1 tablet by mouth daily with breakfast.   omeprazole 20 MG capsule Commonly known as: PRILOSEC Take 20 mg by mouth daily as needed (for indigestion/upset stomach).   OXYGEN Inhale 2 L/min into the lungs as needed (TO KEEP SATS AT >90%).   Oyster Shell 500 MG Tabs Take 500 mg by mouth daily.   polyethylene glycol 17 g packet Commonly known as: MIRALAX  / GLYCOLAX  Take 17 g by mouth daily as needed for mild constipation.   propranolol 10 MG tablet Commonly known as: INDERAL Take 10 mg by mouth 2 (two) times daily.   sennosides-docusate sodium  8.6-50 MG tablet Commonly known as: SENOKOT-S Take 1-2 tablets by mouth daily as needed for constipation.   sodium fluoride  1.1 % Gel dental gel Commonly known as: FLUORISHIELD Place 1 Application onto teeth in the morning.        Review of Systems  Constitutional:  Negative for appetite change, fatigue and fever.       Gradual weight loss  HENT:  Positive for hearing loss. Negative for congestion and trouble swallowing.   Eyes:  Negative for visual disturbance.  Respiratory:  Negative for cough and shortness of breath.        DOE  Cardiovascular:  Negative for chest pain, palpitations and leg swelling.  Gastrointestinal:  Negative for abdominal pain and constipation.  Genitourinary:  Positive for frequency. Negative for dysuria and urgency.       Bathroom trips 3-4x/night  Musculoskeletal:  Positive for arthralgias, back pain and gait problem.       Left hip pain, s/p total left hip arthroplasty. Multiple sites aches in am, toes.  Chronic back pain  Skin:  Negative for color change.  Neurological:  Negative for tremors, seizures and  weakness.       Restless legs at night.  Memory lapses.   Psychiatric/Behavioral:  Negative for behavioral problems and sleep disturbance. The patient is not nervous/anxious.     Immunization History  Administered Date(s) Administered   Fluzone Influenza virus vaccine,trivalent (IIV3), split virus 08/11/2018, 08/03/2019   INFLUENZA, HIGH DOSE SEASONAL PF 09/01/2023   Influenza-Unspecified 09/01/2018, 09/10/2020, 09/18/2021, 09/21/2022   Moderna Covid-19 Fall Seasonal Vaccine 83yrs & older 09/15/2023   Moderna Sars-Covid-2 Vaccination 12/04/2019, 01/01/2020   Pfizer Covid-19 Vaccine Bivalent Booster 78yrs & up 10/01/2022   Pneumococcal Conjugate-13 05/29/2014   Pneumococcal Polysaccharide-23 06/14/2015   Tdap 09/14/2012   Zoster Recombinant(Shingrix) 07/05/2018, 02/10/2022, 05/11/2022   Zoster, Live 04/30/2004, 04/13/2018, 07/05/2018   Pertinent  Health Maintenance Due  Topic Date Due   Bone Density Scan  Never done   Influenza Vaccine  06/30/2024      11/04/2020   12:00 PM 04/23/2021   11:34 AM 12/08/2022    9:52 AM 12/11/2022    2:50 PM 05/19/2023    9:23 AM  Fall Risk  Falls in the past year?   0 0 1  Was there an injury with Fall?   0  0  1   Fall Risk Category Calculator   0 0 2  Fall Risk Category (Retired)   Low  Low    (RETIRED) Patient Fall Risk Level High fall risk  Moderate fall risk  Low fall  risk  Low fall risk    Patient at Risk for Falls Due to   No Fall Risks No Fall Risks History of fall(s);Impaired balance/gait;Impaired mobility  Fall risk Follow up   Falls evaluation completed  Falls evaluation completed  Falls evaluation completed     Data saved with a previous flowsheet row definition   Functional Status Survey:    Vitals:   12/04/24 1306  BP: 126/60  Pulse: 79  Resp: 18  Temp: (!) 97.3 F (36.3 C)  SpO2: 94%  Weight: 100 lb 14.4 oz (45.8 kg)   Body mass index is 19.71 kg/m. Physical Exam Constitutional:      Comments: frail  HENT:     Head:  Normocephalic and atraumatic.     Mouth/Throat:     Mouth: Mucous membranes are moist.  Eyes:     Extraocular Movements: Extraocular movements intact.     Pupils: Pupils are equal, round, and reactive to light.  Cardiovascular:     Rate and Rhythm: Normal rate and regular rhythm.     Heart sounds: No murmur heard.    Comments: Weak left DP pulse, more symptomatic tingling sensation at night.  Pulmonary:     Effort: Pulmonary effort is normal.     Breath sounds: Rales present.     Comments: Fine rales R lung base Abdominal:     General: Bowel sounds are normal.     Palpations: Abdomen is soft.     Tenderness: There is no abdominal tenderness.  Musculoskeletal:     Cervical back: Normal range of motion and neck supple.     Right lower leg: No edema.     Left lower leg: No edema.     Comments: mild scoliosis. Hx of back surgeries x3 with left sciatica pain. s/p total left hip replacement. R SIJ pain, positional     Skin:    General: Skin is warm and dry.  Neurological:     General: No focal deficit present.     Mental Status: She is alert. Mental status is at baseline.     Motor: No weakness.     Gait: Gait abnormal.     Comments: Intentional tremor in hands, difficulty feeding self-much improved on Propranolol.  Ambulates with walker with SBA Oriented to person, place.    Psychiatric:        Mood and Affect: Mood normal.        Behavior: Behavior normal.        Thought Content: Thought content normal.     Labs reviewed: Recent Labs    06/29/24 1037 06/30/24 0429 07/13/24 1459 08/22/24 0000 08/29/24 2250  NA 132* 127* 132* 132* 131*  K 3.6 3.2* 4.8 4.3 4.0  CL 98 100 99 100 95*  CO2 21* 24 26* 26* 23  GLUCOSE 133* 97  --   --  130*  BUN 12 7* 10 14 17   CREATININE 0.52 0.45 0.6 0.6 0.72  CALCIUM  9.2 8.6* 9.8 9.9 10.0   Recent Labs    06/20/24 0000 06/29/24 1037 08/29/24 2250  AST 17 25 33  ALT 9 14 30   ALKPHOS 67 61 85  BILITOT  --  0.9 0.8  PROT  --   8.0 7.3  ALBUMIN  4.4 4.3 4.2   Recent Labs    05/11/24 0000 06/02/24 1729 06/20/24 0000 06/29/24 1037 06/30/24 0429 08/29/24 2250  WBC 6.1   < > 13.3 8.1 8.2 12.0*  NEUTROABS 3,569.00  --  10,800.00 7.1  --   --  HGB 11.8*   < > 12.6 12.9 11.7* 14.3  HCT 36   < > 37 39.9 35.5* 42.4  MCV  --    < >  --  96.4 98.3 96.8  PLT 441*   < > 457* 550* 342 314   < > = values in this interval not displayed.   Lab Results  Component Value Date   TSH 0.931 06/29/2024   Lab Results  Component Value Date   HGBA1C 5.8 (H) 10/21/2020   Lab Results  Component Value Date   CHOL 171 06/20/2020   HDL 59 06/20/2020   LDLCALC 94 06/20/2020   TRIG 89 06/20/2020   CHOLHDL 2.9 06/20/2020    Significant Diagnostic Results in last 30 days:  No results found.  Assessment/Plan  Adult failure to thrive supportive care in SNF [facility], comfort measures. Under Hospice service.   Venous insufficiency Edema not apparent, 09/11/24 off  Furosemide , 06/20/20 EF 60-65%  Restless leg syndrome Tremor in hands, much better since Propranolol.  RLS takes Gabapentin , off Requip. Vit B12 818 03/25/23(taking Vit B12)  Seizures (HCC) saw neurology, hx of recurrent the left sided weakness. EEG showed focal seizure right frontal region. Takes Keppra  since 07/23/20.  Hypothyroidism  taking Levothyroxine , TSH 1.72 03/30/24  Hyponatremia  stable, Na 133 10/03/24, declined Nacl in the past.   Hypokalemia  K 4.0 08/29/24, on potassium supplement  Insomnia secondary to anxiety stable, on  Lorazepam ,  failed Mirtazapine, Lexapro, Ambien .   GERD (gastroesophageal reflux disease) stable, taking Omeprazole,  Hgb 14.3 08/29/24  Slow transit constipation Stable  takes MiraLax , Senokot S  Osteoarthritis, multiple sites Ambulates with walker with SBA  Osteoporosis  off Boniva, Ca, Vit D, declined DEXA, Vit D 16 10/07/21.   TIA (transient ischemic attack) TIA, x2, off  Atorvastatin  per patient's request, on ASA.   Resolved left arm weakness 11/18/2024, suspected TIA, decline ED eval.  MOST: Comfort measures  Urinary frequency Urinary frequency, prolapsed bladder, chronic,  declined Myrbetriq 2/2 to the cost.    Family/ staff Communication: Plan of care reviewed with the patient and charge nurse  Labs/tests ordered: None       [1]  Allergies Allergen Reactions   Codeine Rash   Penicillins Rash    Has patient had a PCN reaction causing immediate rash, facial/tongue/throat swelling, SOB or lightheadedness with hypotension: No Has patient had a PCN reaction causing severe rash involving mucus membranes or skin necrosis: No Has patient had a PCN reaction that required hospitalization: No Has patient had a PCN reaction occurring within the last 10 years: No If all of the above answers are NO, then may proceed with Cephalosporin use.  Tolerated Cephalosporin 10/31/20     Levaquin [Levofloxacin] Other (See Comments)    Allergic, per MAR   Tuberculin Tests Other (See Comments)    Allergic, per MAR   Tuberculin, Ppd Other (See Comments)    Allergic, per [facility]   "

## 2024-12-04 NOTE — Assessment & Plan Note (Signed)
 stable, Na 133 10/03/24, declined Nacl in the past.

## 2024-12-04 NOTE — Assessment & Plan Note (Signed)
Ambulates with walker with SBA 

## 2024-12-04 NOTE — Assessment & Plan Note (Signed)
 stable, on  Lorazepam ,  failed Mirtazapine, Lexapro, Ambien .

## 2024-12-04 NOTE — Assessment & Plan Note (Signed)
Urinary frequency, prolapsed bladder, chronic,  declined Myrbetriq 2/2 to the cost.

## 2024-12-04 NOTE — Assessment & Plan Note (Signed)
"   K 4.0 08/29/24, on potassium supplement "

## 2024-12-04 NOTE — Assessment & Plan Note (Signed)
 taking Levothyroxine , TSH 1.72 03/30/24

## 2024-12-04 NOTE — Assessment & Plan Note (Signed)
 saw neurology, hx of recurrent the left sided weakness. EEG showed focal seizure right frontal region. Takes Keppra  since 07/23/20.

## 2024-12-04 NOTE — Assessment & Plan Note (Signed)
 Edema not apparent, 09/11/24 off  Furosemide , 06/20/20 EF 60-65%

## 2024-12-04 NOTE — Assessment & Plan Note (Signed)
 supportive care in SNF [facility], comfort measures. Under Hospice service.

## 2024-12-04 NOTE — Assessment & Plan Note (Signed)
 off Boniva, Ca, Vit D, declined DEXA, Vit D 16 10/07/21.

## 2024-12-04 NOTE — Assessment & Plan Note (Signed)
 stable, taking Omeprazole,  Hgb 14.3 08/29/24

## 2024-12-04 NOTE — Assessment & Plan Note (Signed)
 Tremor in hands, much better since Propranolol.              RLS takes Gabapentin , off Requip. Vit B12 818 03/25/23(taking Vit B12)

## 2024-12-22 ENCOUNTER — Other Ambulatory Visit: Payer: Self-pay | Admitting: Nurse Practitioner

## 2025-01-03 ENCOUNTER — Non-Acute Institutional Stay (SKILLED_NURSING_FACILITY): Admitting: Nurse Practitioner

## 2025-01-03 ENCOUNTER — Encounter: Payer: Self-pay | Admitting: Nurse Practitioner

## 2025-01-03 DIAGNOSIS — R600 Localized edema: Secondary | ICD-10-CM | POA: Diagnosis not present

## 2025-01-03 DIAGNOSIS — K5901 Slow transit constipation: Secondary | ICD-10-CM | POA: Diagnosis not present

## 2025-01-03 DIAGNOSIS — E039 Hypothyroidism, unspecified: Secondary | ICD-10-CM

## 2025-01-03 DIAGNOSIS — I639 Cerebral infarction, unspecified: Secondary | ICD-10-CM | POA: Diagnosis not present

## 2025-01-03 DIAGNOSIS — E876 Hypokalemia: Secondary | ICD-10-CM | POA: Diagnosis not present

## 2025-01-03 DIAGNOSIS — G2581 Restless legs syndrome: Secondary | ICD-10-CM | POA: Diagnosis not present

## 2025-01-03 DIAGNOSIS — E871 Hypo-osmolality and hyponatremia: Secondary | ICD-10-CM

## 2025-01-03 DIAGNOSIS — F419 Anxiety disorder, unspecified: Secondary | ICD-10-CM

## 2025-01-03 DIAGNOSIS — F5105 Insomnia due to other mental disorder: Secondary | ICD-10-CM | POA: Diagnosis not present

## 2025-01-03 DIAGNOSIS — R569 Unspecified convulsions: Secondary | ICD-10-CM | POA: Diagnosis not present

## 2025-01-03 DIAGNOSIS — K219 Gastro-esophageal reflux disease without esophagitis: Secondary | ICD-10-CM | POA: Diagnosis not present

## 2025-01-03 MED ORDER — LORAZEPAM 0.5 MG PO TABS: 0.2500 mg | ORAL_TABLET | Freq: Every day | ORAL | 1 refills | Status: AC

## 2025-01-03 NOTE — Assessment & Plan Note (Signed)
 Anxiety/insomnia, stable, reduced Lorazepam  0.25mg  at bedtime/0.5mg  at bedtime 01/03/25,  failed Mirtazapine, Lexapro, Ambien .

## 2025-01-03 NOTE — Progress Notes (Unsigned)
 " Location:  [facility] Room Number: N063-A Place of Service:  SNF (31) Provider:  Delight Bickle X, NP  Patient Care Team: Aracelie Addis X, NP as PCP - General (Internal Medicine)  Extended Emergency Contact Information Primary Emergency Contact: Hertzwig,Kevin Address: [address]          [address]          [place], [place] [address] [place] Home Phone: [phone] Mobile Phone: [phone] Relation: Son Secondary Emergency Contact: Hertzwig,Eric Address: [address] [place] , [place] [address] [place] Home Phone: [phone] Mobile Phone: [phone] Relation: Son  Code Status:  DNR Goals of care: Advanced Directive information    08/29/2024    9:51 PM  Advanced Directives  Does Patient Have a Medical Advance Directive? Yes  Type of Advance Directive Out of facility DNR (pink MOST or yellow form)     Chief Complaint  Patient presents with   Routine Visit    HPI:  Pt is a 89 y.o. female seen today for medical management of chronic diseases.    Failure to thrive, supportive care in SNF [facility], comfort measures. Under Hospice service.              Edema BLE, not apparent, 09/11/24 off  Furosemide , 06/20/20 EF 60-65%             Tremor in hands, much better since Propranolol.              RLS takes Gabapentin , off Requip. Vit B12 818 03/25/23(taking Vit B12)             Seizure, saw neurology, hx of recurrent the left sided weakness. EEG showed focal seizure right frontal region. Takes Keppra  since 07/23/20.             Hypothyroidism, taking Levothyroxine , TSH 1.72 03/30/24             Hyponatremia, stable, Na 133 10/03/24, declined Nacl in the past.              Hypokalemia, K 4.0 08/29/24             Anxiety/insomnia, stable, reduced Lorazepam  0.25mg  at bedtime/0.5mg  at bedtime 01/03/25,  failed Mirtazapine, Lexapro, Ambien .              GERD, stable, taking Omeprazole,  Hgb 14.3 08/29/24             Constipation,  takes MiraLax , Senokot S             OA, left hip, s/p total left hip, multiple sites, takes Mobic , Gabapentin  for pain in toes, failed Methocarbamol  recommended by Ortho-felt wobbly.              OP off Boniva, Ca, Vit D, declined DEXA, Vit D 16 10/07/21.              TIA, x2, off  Atorvastatin  per patient's request, on ASA.  Resolved left arm weakness 11/18/2024, suspected TIA, decline ED eval.  MOST: Comfort measures             Urinary frequency, prolapsed bladder, chronic,  declined Myrbetriq 2/2 to the cost.        Past Medical History:  Diagnosis Date   Abdominal pain 02/26/2017   Anxiety    Arthritis    Left Knee, Wrist, Back and Hips   Cervical vertebral fusion    Diverticulitis  Diverticulosis    Fibromyalgia    GERD (gastroesophageal reflux disease)    Gout 02/2018   L hand   Hypertension     after d/c from hospital no current problems or medication   Hypothyroidism    Pelvis fracture (HCC)    Peritoneal free air 03/29/2012   Pneumonia    Pneumoperitoneum 02/26/2017   Pre-diabetes    Seizure (HCC)    Thyroid  disorder    Toe pain 09/06/2016   Past Surgical History:  Procedure Laterality Date   ABDOMINAL HYSTERECTOMY     BOWEL RESECTION N/A 07/09/2017   Procedure: SMALL BOWEL RESECTION;  Surgeon: Gladis Cough, MD;  Location: [facility];  Service: General;  Laterality: N/A;   CERVICAL FUSION     x2   CONVERSION TO TOTAL HIP Left 10/31/2020   Procedure: CONVERSION TO ANTERIOR TOTAL HIP;  Surgeon: Ernie Cough, MD;  Location: [facility];  Service: Orthopedics;  Laterality: Left;  2 hrs   FEMUR IM NAIL Left 03/03/2020   Procedure: INTRAMEDULLARY (IM) NAIL FEMORAL;  Surgeon: Ernie Cough, MD;  Location: [facility];  Service: Orthopedics;  Laterality: Left;   LAPAROSCOPIC APPENDECTOMY N/A 03/04/2017   Procedure: APPENDECTOMY LAPAROSCOPIC;  Surgeon: Cough Gladis, MD;  Location: [facility];  Service: General;  Laterality: N/A;   LAPAROSCOPY N/A 03/04/2017    Procedure: LAPAROSCOPY DIAGNOSTIC, ENTEROLYSIS;  Surgeon: Cough Gladis, MD;  Location: [facility];  Service: General;  Laterality: N/A;   LAPAROTOMY N/A 07/09/2017   Procedure: EXPLORATORY LAPAROTOMY;  Surgeon: Gladis Cough, MD;  Location: [facility];  Service: General;  Laterality: N/A;   SPINE SURGERY     Lumbar- rod placement   TONSILLECTOMY     89y/o   TOTAL VAGINAL HYSTERECTOMY      Allergies[1]  Outpatient Encounter Medications as of 01/03/2025  Medication Sig   acetaminophen  (TYLENOL ) 500 MG tablet Take 1,000 mg by mouth 2 (two) times daily as needed (for pain).   aspirin  EC 81 MG tablet Take 81 mg by mouth at bedtime. Swallow whole.   cyanocobalamin (VITAMIN B12) 1000 MCG tablet Take 1,000 mcg by mouth daily.   gabapentin  (NEURONTIN ) 100 MG capsule Take 1 capsule (100 mg total) by mouth at bedtime.   levETIRAcetam  (KEPPRA  XR) 500 MG 24 hr tablet Take 1 tablet (500 mg total) by mouth daily.   levothyroxine  (SYNTHROID ) 75 MCG tablet Take 1 tablet (75 mcg total) by mouth daily before breakfast.   Melatonin 5 MG CHEW Chew 5 mg by mouth at bedtime.   meloxicam  (MOBIC ) 7.5 MG tablet Take 1 tablet (7.5 mg total) by mouth daily.   Multiple Vitamin (MULTIVITAMIN) TABS Take 1 tablet by mouth daily with breakfast.   omeprazole (PRILOSEC) 20 MG capsule Take 20 mg by mouth daily as needed (for indigestion/upset stomach).   OXYGEN Inhale 2 L/min into the lungs as needed (TO KEEP SATS AT >90%).   Oyster Shell 500 MG TABS Take 500 mg by mouth daily.   polyethylene glycol (MIRALAX  / GLYCOLAX ) 17 g packet Take 17 g by mouth daily as needed for mild constipation.   propranolol (INDERAL) 10 MG tablet Take 10 mg by mouth 2 (two) times daily.   sennosides-docusate sodium  (SENOKOT-S) 8.6-50 MG tablet Take 1-2 tablets by mouth daily as needed for constipation.   sodium fluoride  (FLUORISHIELD) 1.1 % GEL dental gel Place 1 Application onto teeth in the morning.   [DISCONTINUED] LORazepam   (ATIVAN ) 0.5 MG tablet Take 1 tablet (0.5 mg total) by mouth at bedtime.  LORazepam  (ATIVAN ) 0.5 MG tablet Take 0.5 tablets (0.25 mg total) by mouth at bedtime.   No facility-administered encounter medications on file as of 01/03/2025.    Review of Systems  Constitutional:  Negative for appetite change, fatigue and fever.       Gradual weight loss  HENT:  Positive for hearing loss. Negative for congestion and trouble swallowing.   Eyes:  Negative for visual disturbance.  Respiratory:  Negative for cough and shortness of breath.        DOE  Cardiovascular:  Negative for chest pain, palpitations and leg swelling.  Gastrointestinal:  Negative for abdominal pain and constipation.  Genitourinary:  Positive for frequency. Negative for dysuria and urgency.       Bathroom trips 3-4x/night  Musculoskeletal:  Positive for arthralgias, back pain and gait problem.       Left hip pain, s/p total left hip arthroplasty. Multiple sites aches in am, toes.  Chronic back pain  Skin:  Negative for color change.  Neurological:  Negative for tremors, seizures and weakness.       Restless legs at night.  Memory lapses.   Psychiatric/Behavioral:  Negative for behavioral problems and sleep disturbance. The patient is not nervous/anxious.     Immunization History  Administered Date(s) Administered   Fluzone Influenza virus vaccine,trivalent (IIV3), split virus 08/11/2018, 08/03/2019   INFLUENZA, HIGH DOSE SEASONAL PF 09/01/2023   Influenza-Unspecified 09/01/2018, 09/10/2020, 09/18/2021, 09/21/2022, 09/05/2024   Moderna Covid-19 Fall Seasonal Vaccine 9yrs & older 09/15/2023   Moderna Sars-Covid-2 Vaccination 12/04/2019, 01/01/2020   Pfizer Covid-19 Vaccine Bivalent Booster 8yrs & up 10/01/2022   Pneumococcal Conjugate-13 05/29/2014   Pneumococcal Polysaccharide-23 06/14/2015   Td 03/23/1995   Tdap 09/14/2012   Unspecified SARS-COV-2 Vaccination 09/25/2024   Zoster Recombinant(Shingrix)  07/05/2018, 02/10/2022, 05/11/2022   Zoster, Live 04/30/2004, 04/13/2018, 07/05/2018   Pertinent  Health Maintenance Due  Topic Date Due   Bone Density Scan  Never done   Influenza Vaccine  Completed      11/04/2020   12:00 PM 04/23/2021   11:34 AM 12/08/2022    9:52 AM 12/11/2022    2:50 PM 05/19/2023    9:23 AM  Fall Risk  Falls in the past year?   0 0 1  Was there an injury with Fall?   0  0  1   Fall Risk Category Calculator   0 0 2  Fall Risk Category (Retired)   Low  Low    (RETIRED) Patient Fall Risk Level High fall risk  Moderate fall risk  Low fall risk  Low fall risk    Patient at Risk for Falls Due to   No Fall Risks No Fall Risks History of fall(s);Impaired balance/gait;Impaired mobility  Fall risk Follow up   Falls evaluation completed  Falls evaluation completed  Falls evaluation completed     Data saved with a previous flowsheet row definition   Functional Status Survey:    Vitals:   01/03/25 1028  BP: 115/80  Pulse: 74  Resp: 18  Temp: (!) 97.3 F (36.3 C)  SpO2: 94%  Weight: 100 lb 14.4 oz (45.8 kg)  Height: 5' (1.524 m)   Body mass index is 19.71 kg/m. Physical Exam Constitutional:      Comments: frail  HENT:     Head: Normocephalic and atraumatic.     Mouth/Throat:     Mouth: Mucous membranes are moist.  Eyes:     Extraocular Movements: Extraocular movements intact.  Pupils: Pupils are equal, round, and reactive to light.  Cardiovascular:     Rate and Rhythm: Normal rate and regular rhythm.     Heart sounds: No murmur heard.    Comments: Weak left DP pulse, more symptomatic tingling sensation at night.  Pulmonary:     Effort: Pulmonary effort is normal.     Breath sounds: Rales present.     Comments: Fine rales R lung base Abdominal:     General: Bowel sounds are normal.     Palpations: Abdomen is soft.     Tenderness: There is no abdominal tenderness.  Musculoskeletal:     Cervical back: Normal range of motion and neck supple.      Right lower leg: No edema.     Left lower leg: No edema.     Comments: mild scoliosis. Hx of back surgeries x3 with left sciatica pain. s/p total left hip replacement. R SIJ pain, positional     Skin:    General: Skin is warm and dry.  Neurological:     General: No focal deficit present.     Mental Status: She is alert. Mental status is at baseline.     Motor: No weakness.     Gait: Gait abnormal.     Comments: Intentional tremor in hands, difficulty feeding self-much improved on Propranolol.  Ambulates with walker with SBA Oriented to person, place.    Psychiatric:        Mood and Affect: Mood normal.        Behavior: Behavior normal.        Thought Content: Thought content normal.     Labs reviewed: Recent Labs    06/29/24 1037 06/30/24 0429 07/13/24 1459 08/29/24 2250 09/05/24 0000 10/03/24 0000  NA 132* 127*   < > 131* 133* 133*  K 3.6 3.2*   < > 4.0 4.6 4.5  CL 98 100   < > 95* 98* 96*  CO2 21* 24   < > 23 27* 27*  GLUCOSE 133* 97  --  130*  --   --   BUN 12 7*   < > 17 13 13   CREATININE 0.52 0.45   < > 0.72 0.6 0.6  CALCIUM  9.2 8.6*   < > 10.0 9.9 10.3   < > = values in this interval not displayed.   Recent Labs    06/29/24 1037 08/29/24 2250 09/05/24 0000  AST 25 33 22  ALT 14 30 23   ALKPHOS 61 85 84  BILITOT 0.9 0.8  --   PROT 8.0 7.3  --   ALBUMIN  4.3 4.2 4.2   Recent Labs    06/20/24 0000 06/29/24 1037 06/30/24 0429 08/29/24 2250 09/05/24 0000  WBC 13.3 8.1 8.2 12.0* 5.4  NEUTROABS 10,800.00 7.1  --   --  2,954.00  HGB 12.6 12.9 11.7* 14.3 13.4  HCT 37 39.9 35.5* 42.4 40  MCV  --  96.4 98.3 96.8  --   PLT 457* 550* 342 314 467*   Lab Results  Component Value Date   TSH 0.931 06/29/2024   Lab Results  Component Value Date   HGBA1C 5.8 (H) 10/21/2020   Lab Results  Component Value Date   CHOL 171 06/20/2020   HDL 59 06/20/2020   LDLCALC 94 06/20/2020   TRIG 89 06/20/2020   CHOLHDL 2.9 06/20/2020    Significant Diagnostic  Results in last 30 days:  No results found.  Assessment/Plan Hypothyroidism  taking Levothyroxine , TSH 1.72  03/30/24  Hyponatremia stable, Na 133 10/03/24, declined Nacl in the past.   Hypokalemia K 4.0 08/29/24  Insomnia secondary to anxiety Anxiety/insomnia, stable, reduced Lorazepam  0.25mg  at bedtime/0.5mg  at bedtime 01/03/25,  failed Mirtazapine, Lexapro, Ambien .   GERD (gastroesophageal reflux disease) stable, taking Omeprazole,  Hgb 14.3 08/29/24  Slow transit constipation Stable, takes MiraLax , Senokot S  Osteoarthritis, multiple sites Pain is managed on Mobic , Gabapentin .   Cerebrovascular accident (CVA) (HCC) TIA, x2, off  Atorvastatin  per patient's request, on ASA.  Resolved left arm weakness 11/18/2024, suspected TIA, decline ED eval.  MOST: Comfort measures  Restless leg syndrome  Tremor in hands, much better since Propranolol.   RLS takes Gabapentin , off Requip. Vit B12 818 03/25/23(taking Vit B12)  Seizures (HCC) Seizure, saw neurology, hx of recurrent the left sided weakness. EEG showed focal seizure right frontal region. Takes Keppra  since 07/23/20.     Family/ staff Communication: plan of care reviewed with the patient and charge nurse.   Labs/tests ordered:  none         [1] Allergies Allergen Reactions   Codeine Rash   Penicillins Rash    Has patient had a PCN reaction causing immediate rash, facial/tongue/throat swelling, SOB or lightheadedness with hypotension: No Has patient had a PCN reaction causing severe rash involving mucus membranes or skin necrosis: No Has patient had a PCN reaction that required hospitalization: No Has patient had a PCN reaction occurring within the last 10 years: No If all of the above answers are NO, then may proceed with Cephalosporin use.  Tolerated Cephalosporin 10/31/20     Levaquin [Levofloxacin] Other (See Comments)    Allergic, per [facility]   Tuberculin Tests Other (See Comments)    Allergic, per [facility]    Tuberculin, Ppd Other (See Comments)    Allergic, per [facility]  "

## 2025-01-03 NOTE — Assessment & Plan Note (Signed)
Stable, takes MiraLax, Senokot S 

## 2025-01-03 NOTE — Assessment & Plan Note (Signed)
 Pain is managed on Mobic , Gabapentin .

## 2025-01-03 NOTE — Assessment & Plan Note (Addendum)
 K 4.0 08/29/24

## 2025-01-03 NOTE — Assessment & Plan Note (Signed)
 Tremor in hands, much better since Propranolol.              RLS takes Gabapentin , off Requip. Vit B12 818 03/25/23(taking Vit B12)

## 2025-01-03 NOTE — Assessment & Plan Note (Signed)
 Seizure, saw neurology, hx of recurrent the left sided weakness. EEG showed focal seizure right frontal region. Takes Keppra  since 07/23/20.

## 2025-01-03 NOTE — Assessment & Plan Note (Signed)
 stable, taking Omeprazole,  Hgb 14.3 08/29/24

## 2025-01-03 NOTE — Assessment & Plan Note (Signed)
 stable, Na 133 10/03/24, declined Nacl in the past.

## 2025-01-03 NOTE — Assessment & Plan Note (Signed)
 taking Levothyroxine , TSH 1.72 03/30/24

## 2025-01-03 NOTE — Assessment & Plan Note (Signed)
 TIA, x2, off  Atorvastatin  per patient's request, on ASA.  Resolved left arm weakness 11/18/2024, suspected TIA, decline ED eval.  MOST: Comfort measures

## 2025-01-04 ENCOUNTER — Encounter: Payer: Self-pay | Admitting: Nurse Practitioner

## 2025-01-04 NOTE — Assessment & Plan Note (Signed)
 Edema BLE, chronic, mostly in ankle/feet,  09/11/24 off  Furosemide , 06/20/20 EF 60-65%
